# Patient Record
Sex: Male | Born: 1935 | Race: Asian | Hispanic: No | Smoking: Never smoker
Health system: Southern US, Community
[De-identification: ages and names within clinical notes are randomized; demographics above are authoritative.]

## PROBLEM LIST (undated history)

## (undated) DIAGNOSIS — C61 Malignant neoplasm of prostate: Secondary | ICD-10-CM

## (undated) DIAGNOSIS — I739 Peripheral vascular disease, unspecified: Secondary | ICD-10-CM

## (undated) DIAGNOSIS — I6522 Occlusion and stenosis of left carotid artery: Secondary | ICD-10-CM

## (undated) DIAGNOSIS — E119 Type 2 diabetes mellitus without complications: Secondary | ICD-10-CM

## (undated) DIAGNOSIS — E785 Hyperlipidemia, unspecified: Secondary | ICD-10-CM

## (undated) DIAGNOSIS — I700 Atherosclerosis of aorta: Secondary | ICD-10-CM

## (undated) DIAGNOSIS — N400 Enlarged prostate without lower urinary tract symptoms: Secondary | ICD-10-CM

## (undated) DIAGNOSIS — I779 Disorder of arteries and arterioles, unspecified: Secondary | ICD-10-CM

## (undated) DIAGNOSIS — I10 Essential (primary) hypertension: Secondary | ICD-10-CM

## (undated) DIAGNOSIS — I2510 Atherosclerotic heart disease of native coronary artery without angina pectoris: Secondary | ICD-10-CM

## (undated) DIAGNOSIS — I7300 Raynaud's syndrome without gangrene: Secondary | ICD-10-CM

## (undated) DIAGNOSIS — K219 Gastro-esophageal reflux disease without esophagitis: Secondary | ICD-10-CM

## (undated) DIAGNOSIS — D649 Anemia, unspecified: Secondary | ICD-10-CM

## (undated) HISTORY — DX: Gastro-esophageal reflux disease without esophagitis: K21.9

## (undated) HISTORY — DX: Raynaud's syndrome without gangrene: I73.00

## (undated) HISTORY — DX: Occlusion and stenosis of left carotid artery: I65.22

## (undated) HISTORY — DX: Atherosclerotic heart disease of native coronary artery without angina pectoris: I25.10

## (undated) HISTORY — DX: Peripheral vascular disease, unspecified: I73.9

## (undated) HISTORY — DX: Essential (primary) hypertension: I10

## (undated) HISTORY — DX: Hyperlipidemia, unspecified: E78.5

## (undated) HISTORY — DX: Disorder of arteries and arterioles, unspecified: I77.9

## (undated) HISTORY — DX: Atherosclerosis of aorta: I70.0

## (undated) HISTORY — PX: CATARACT EXTRACTION: SUR2

## (undated) HISTORY — DX: Type 2 diabetes mellitus without complications: E11.9

## (undated) HISTORY — DX: Benign prostatic hyperplasia without lower urinary tract symptoms: N40.0

---

## 2007-10-07 ENCOUNTER — Ambulatory Visit: Payer: Self-pay | Admitting: Internal Medicine

## 2008-02-21 HISTORY — PX: CARDIAC CATHETERIZATION: SHX172

## 2009-09-21 ENCOUNTER — Ambulatory Visit: Payer: Self-pay | Admitting: Ophthalmology

## 2009-10-04 ENCOUNTER — Ambulatory Visit: Payer: Self-pay | Admitting: Ophthalmology

## 2009-11-17 ENCOUNTER — Ambulatory Visit: Payer: Self-pay | Admitting: Ophthalmology

## 2012-03-22 DIAGNOSIS — R972 Elevated prostate specific antigen [PSA]: Secondary | ICD-10-CM | POA: Insufficient documentation

## 2012-03-22 DIAGNOSIS — N3941 Urge incontinence: Secondary | ICD-10-CM | POA: Insufficient documentation

## 2012-03-22 DIAGNOSIS — N478 Other disorders of prepuce: Secondary | ICD-10-CM | POA: Insufficient documentation

## 2012-03-22 DIAGNOSIS — B3749 Other urogenital candidiasis: Secondary | ICD-10-CM | POA: Insufficient documentation

## 2012-03-22 DIAGNOSIS — R339 Retention of urine, unspecified: Secondary | ICD-10-CM | POA: Insufficient documentation

## 2012-03-22 DIAGNOSIS — N401 Enlarged prostate with lower urinary tract symptoms: Secondary | ICD-10-CM | POA: Insufficient documentation

## 2012-03-22 DIAGNOSIS — N471 Phimosis: Secondary | ICD-10-CM | POA: Insufficient documentation

## 2012-03-22 DIAGNOSIS — N529 Male erectile dysfunction, unspecified: Secondary | ICD-10-CM | POA: Insufficient documentation

## 2012-03-26 DIAGNOSIS — N401 Enlarged prostate with lower urinary tract symptoms: Secondary | ICD-10-CM | POA: Diagnosis not present

## 2012-03-26 DIAGNOSIS — N4340 Spermatocele of epididymis, unspecified: Secondary | ICD-10-CM | POA: Insufficient documentation

## 2012-03-26 DIAGNOSIS — R972 Elevated prostate specific antigen [PSA]: Secondary | ICD-10-CM | POA: Diagnosis not present

## 2012-03-26 DIAGNOSIS — R339 Retention of urine, unspecified: Secondary | ICD-10-CM | POA: Diagnosis not present

## 2012-03-27 DIAGNOSIS — R6889 Other general symptoms and signs: Secondary | ICD-10-CM | POA: Diagnosis not present

## 2012-03-27 DIAGNOSIS — I10 Essential (primary) hypertension: Secondary | ICD-10-CM | POA: Diagnosis not present

## 2012-03-27 DIAGNOSIS — Z0000 Encounter for general adult medical examination without abnormal findings: Secondary | ICD-10-CM | POA: Diagnosis not present

## 2012-03-27 DIAGNOSIS — R300 Dysuria: Secondary | ICD-10-CM | POA: Diagnosis not present

## 2012-03-27 DIAGNOSIS — E782 Mixed hyperlipidemia: Secondary | ICD-10-CM | POA: Diagnosis not present

## 2012-03-27 DIAGNOSIS — E1142 Type 2 diabetes mellitus with diabetic polyneuropathy: Secondary | ICD-10-CM | POA: Diagnosis not present

## 2012-03-27 DIAGNOSIS — E1149 Type 2 diabetes mellitus with other diabetic neurological complication: Secondary | ICD-10-CM | POA: Diagnosis not present

## 2012-03-27 DIAGNOSIS — I2510 Atherosclerotic heart disease of native coronary artery without angina pectoris: Secondary | ICD-10-CM | POA: Diagnosis not present

## 2012-03-27 DIAGNOSIS — N429 Disorder of prostate, unspecified: Secondary | ICD-10-CM | POA: Diagnosis not present

## 2012-04-02 DIAGNOSIS — Z961 Presence of intraocular lens: Secondary | ICD-10-CM | POA: Diagnosis not present

## 2012-04-02 DIAGNOSIS — M25579 Pain in unspecified ankle and joints of unspecified foot: Secondary | ICD-10-CM | POA: Diagnosis not present

## 2012-04-02 DIAGNOSIS — H2510 Age-related nuclear cataract, unspecified eye: Secondary | ICD-10-CM | POA: Diagnosis not present

## 2012-04-02 DIAGNOSIS — R229 Localized swelling, mass and lump, unspecified: Secondary | ICD-10-CM | POA: Diagnosis not present

## 2012-04-02 DIAGNOSIS — E1049 Type 1 diabetes mellitus with other diabetic neurological complication: Secondary | ICD-10-CM | POA: Diagnosis not present

## 2012-04-11 DIAGNOSIS — M25579 Pain in unspecified ankle and joints of unspecified foot: Secondary | ICD-10-CM | POA: Diagnosis not present

## 2012-04-11 DIAGNOSIS — E1149 Type 2 diabetes mellitus with other diabetic neurological complication: Secondary | ICD-10-CM | POA: Diagnosis not present

## 2012-04-11 DIAGNOSIS — R229 Localized swelling, mass and lump, unspecified: Secondary | ICD-10-CM | POA: Diagnosis not present

## 2012-04-11 DIAGNOSIS — Z0000 Encounter for general adult medical examination without abnormal findings: Secondary | ICD-10-CM | POA: Diagnosis not present

## 2012-04-11 DIAGNOSIS — R072 Precordial pain: Secondary | ICD-10-CM | POA: Diagnosis not present

## 2012-04-11 DIAGNOSIS — R319 Hematuria, unspecified: Secondary | ICD-10-CM | POA: Diagnosis not present

## 2012-04-11 DIAGNOSIS — E782 Mixed hyperlipidemia: Secondary | ICD-10-CM | POA: Diagnosis not present

## 2012-04-11 DIAGNOSIS — IMO0001 Reserved for inherently not codable concepts without codable children: Secondary | ICD-10-CM | POA: Diagnosis not present

## 2012-04-11 DIAGNOSIS — Z006 Encounter for examination for normal comparison and control in clinical research program: Secondary | ICD-10-CM | POA: Diagnosis not present

## 2012-04-11 DIAGNOSIS — L03039 Cellulitis of unspecified toe: Secondary | ICD-10-CM | POA: Diagnosis not present

## 2012-04-30 DIAGNOSIS — N3941 Urge incontinence: Secondary | ICD-10-CM | POA: Diagnosis not present

## 2012-04-30 DIAGNOSIS — N401 Enlarged prostate with lower urinary tract symptoms: Secondary | ICD-10-CM | POA: Diagnosis not present

## 2012-04-30 DIAGNOSIS — R972 Elevated prostate specific antigen [PSA]: Secondary | ICD-10-CM | POA: Diagnosis not present

## 2012-04-30 DIAGNOSIS — R339 Retention of urine, unspecified: Secondary | ICD-10-CM | POA: Diagnosis not present

## 2012-05-07 ENCOUNTER — Ambulatory Visit: Payer: Self-pay | Admitting: Urology

## 2012-05-07 DIAGNOSIS — Z01812 Encounter for preprocedural laboratory examination: Secondary | ICD-10-CM | POA: Diagnosis not present

## 2012-05-07 DIAGNOSIS — E119 Type 2 diabetes mellitus without complications: Secondary | ICD-10-CM | POA: Diagnosis not present

## 2012-05-07 DIAGNOSIS — I10 Essential (primary) hypertension: Secondary | ICD-10-CM | POA: Diagnosis not present

## 2012-05-07 DIAGNOSIS — Z01810 Encounter for preprocedural cardiovascular examination: Secondary | ICD-10-CM | POA: Diagnosis not present

## 2012-05-07 LAB — BASIC METABOLIC PANEL
Anion Gap: 4 — ABNORMAL LOW (ref 7–16)
BUN: 16 mg/dL (ref 7–18)
Calcium, Total: 8.8 mg/dL (ref 8.5–10.1)
Chloride: 104 mmol/L (ref 98–107)
Co2: 28 mmol/L (ref 21–32)
Creatinine: 0.84 mg/dL (ref 0.60–1.30)
EGFR (African American): >60
EGFR (Non-African Amer.): >60
Glucose: 73 mg/dL (ref 65–99)
Osmolality: 272 (ref 275–301)
Potassium: 4.5 mmol/L (ref 3.5–5.1)
Sodium: 136 mmol/L (ref 136–145)

## 2012-05-07 LAB — HEMOGLOBIN: HGB: 13.7 g/dL (ref 13.0–18.0)

## 2012-05-14 ENCOUNTER — Ambulatory Visit: Payer: Self-pay | Admitting: Urology

## 2012-05-14 DIAGNOSIS — R972 Elevated prostate specific antigen [PSA]: Secondary | ICD-10-CM | POA: Diagnosis not present

## 2012-05-14 DIAGNOSIS — N4340 Spermatocele of epididymis, unspecified: Secondary | ICD-10-CM | POA: Diagnosis not present

## 2012-05-14 DIAGNOSIS — E119 Type 2 diabetes mellitus without complications: Secondary | ICD-10-CM | POA: Diagnosis not present

## 2012-05-14 DIAGNOSIS — Z79899 Other long term (current) drug therapy: Secondary | ICD-10-CM | POA: Diagnosis not present

## 2012-05-14 DIAGNOSIS — N138 Other obstructive and reflux uropathy: Secondary | ICD-10-CM | POA: Diagnosis not present

## 2012-05-14 DIAGNOSIS — N401 Enlarged prostate with lower urinary tract symptoms: Secondary | ICD-10-CM | POA: Diagnosis not present

## 2012-05-14 DIAGNOSIS — R339 Retention of urine, unspecified: Secondary | ICD-10-CM | POA: Diagnosis not present

## 2012-05-14 DIAGNOSIS — N529 Male erectile dysfunction, unspecified: Secondary | ICD-10-CM | POA: Diagnosis not present

## 2012-05-14 DIAGNOSIS — N400 Enlarged prostate without lower urinary tract symptoms: Secondary | ICD-10-CM | POA: Diagnosis not present

## 2012-05-14 DIAGNOSIS — I10 Essential (primary) hypertension: Secondary | ICD-10-CM | POA: Diagnosis not present

## 2012-05-15 DIAGNOSIS — R339 Retention of urine, unspecified: Secondary | ICD-10-CM | POA: Diagnosis not present

## 2012-05-15 LAB — PATHOLOGY REPORT

## 2012-05-23 DIAGNOSIS — R339 Retention of urine, unspecified: Secondary | ICD-10-CM | POA: Diagnosis not present

## 2012-05-23 DIAGNOSIS — N4340 Spermatocele of epididymis, unspecified: Secondary | ICD-10-CM | POA: Diagnosis not present

## 2012-05-23 DIAGNOSIS — N401 Enlarged prostate with lower urinary tract symptoms: Secondary | ICD-10-CM | POA: Diagnosis not present

## 2012-07-24 DIAGNOSIS — R339 Retention of urine, unspecified: Secondary | ICD-10-CM | POA: Diagnosis not present

## 2012-07-24 DIAGNOSIS — N4340 Spermatocele of epididymis, unspecified: Secondary | ICD-10-CM | POA: Diagnosis not present

## 2012-07-24 DIAGNOSIS — N401 Enlarged prostate with lower urinary tract symptoms: Secondary | ICD-10-CM | POA: Diagnosis not present

## 2012-07-24 DIAGNOSIS — R972 Elevated prostate specific antigen [PSA]: Secondary | ICD-10-CM | POA: Diagnosis not present

## 2012-10-09 DIAGNOSIS — E1149 Type 2 diabetes mellitus with other diabetic neurological complication: Secondary | ICD-10-CM | POA: Diagnosis not present

## 2012-10-09 DIAGNOSIS — K219 Gastro-esophageal reflux disease without esophagitis: Secondary | ICD-10-CM | POA: Diagnosis not present

## 2012-10-09 DIAGNOSIS — E782 Mixed hyperlipidemia: Secondary | ICD-10-CM | POA: Diagnosis not present

## 2012-10-09 DIAGNOSIS — I10 Essential (primary) hypertension: Secondary | ICD-10-CM | POA: Diagnosis not present

## 2013-01-10 DIAGNOSIS — E119 Type 2 diabetes mellitus without complications: Secondary | ICD-10-CM | POA: Diagnosis not present

## 2013-01-10 DIAGNOSIS — E1142 Type 2 diabetes mellitus with diabetic polyneuropathy: Secondary | ICD-10-CM | POA: Diagnosis not present

## 2013-01-10 DIAGNOSIS — I739 Peripheral vascular disease, unspecified: Secondary | ICD-10-CM | POA: Diagnosis not present

## 2013-01-21 DIAGNOSIS — I10 Essential (primary) hypertension: Secondary | ICD-10-CM | POA: Diagnosis not present

## 2013-01-21 DIAGNOSIS — E119 Type 2 diabetes mellitus without complications: Secondary | ICD-10-CM | POA: Diagnosis not present

## 2013-01-21 DIAGNOSIS — M79609 Pain in unspecified limb: Secondary | ICD-10-CM | POA: Diagnosis not present

## 2013-01-28 DIAGNOSIS — Z23 Encounter for immunization: Secondary | ICD-10-CM | POA: Diagnosis not present

## 2013-03-03 ENCOUNTER — Ambulatory Visit: Payer: Self-pay | Admitting: Physician Assistant

## 2013-03-03 DIAGNOSIS — K219 Gastro-esophageal reflux disease without esophagitis: Secondary | ICD-10-CM | POA: Diagnosis not present

## 2013-03-03 DIAGNOSIS — R141 Gas pain: Secondary | ICD-10-CM | POA: Diagnosis not present

## 2013-03-03 DIAGNOSIS — R0602 Shortness of breath: Secondary | ICD-10-CM | POA: Diagnosis not present

## 2013-03-03 DIAGNOSIS — I10 Essential (primary) hypertension: Secondary | ICD-10-CM | POA: Diagnosis not present

## 2013-03-03 DIAGNOSIS — E1149 Type 2 diabetes mellitus with other diabetic neurological complication: Secondary | ICD-10-CM | POA: Diagnosis not present

## 2013-03-17 DIAGNOSIS — I10 Essential (primary) hypertension: Secondary | ICD-10-CM | POA: Diagnosis not present

## 2013-03-17 DIAGNOSIS — E782 Mixed hyperlipidemia: Secondary | ICD-10-CM | POA: Diagnosis not present

## 2013-03-17 DIAGNOSIS — K219 Gastro-esophageal reflux disease without esophagitis: Secondary | ICD-10-CM | POA: Diagnosis not present

## 2013-03-17 DIAGNOSIS — E1149 Type 2 diabetes mellitus with other diabetic neurological complication: Secondary | ICD-10-CM | POA: Diagnosis not present

## 2013-03-17 DIAGNOSIS — E1142 Type 2 diabetes mellitus with diabetic polyneuropathy: Secondary | ICD-10-CM | POA: Diagnosis not present

## 2013-03-17 DIAGNOSIS — R0602 Shortness of breath: Secondary | ICD-10-CM | POA: Diagnosis not present

## 2013-03-26 DIAGNOSIS — R0602 Shortness of breath: Secondary | ICD-10-CM | POA: Diagnosis not present

## 2013-03-26 DIAGNOSIS — E1049 Type 1 diabetes mellitus with other diabetic neurological complication: Secondary | ICD-10-CM | POA: Diagnosis not present

## 2013-03-26 DIAGNOSIS — I2510 Atherosclerotic heart disease of native coronary artery without angina pectoris: Secondary | ICD-10-CM | POA: Diagnosis not present

## 2013-03-26 DIAGNOSIS — I10 Essential (primary) hypertension: Secondary | ICD-10-CM | POA: Diagnosis not present

## 2013-03-26 DIAGNOSIS — K219 Gastro-esophageal reflux disease without esophagitis: Secondary | ICD-10-CM | POA: Diagnosis not present

## 2013-04-08 DIAGNOSIS — R5381 Other malaise: Secondary | ICD-10-CM | POA: Diagnosis not present

## 2013-04-08 DIAGNOSIS — IMO0001 Reserved for inherently not codable concepts without codable children: Secondary | ICD-10-CM | POA: Diagnosis not present

## 2013-04-09 DIAGNOSIS — E1149 Type 2 diabetes mellitus with other diabetic neurological complication: Secondary | ICD-10-CM | POA: Diagnosis not present

## 2013-04-09 DIAGNOSIS — R059 Cough, unspecified: Secondary | ICD-10-CM | POA: Diagnosis not present

## 2013-04-09 DIAGNOSIS — R05 Cough: Secondary | ICD-10-CM | POA: Diagnosis not present

## 2013-04-09 DIAGNOSIS — K219 Gastro-esophageal reflux disease without esophagitis: Secondary | ICD-10-CM | POA: Diagnosis not present

## 2013-05-12 DIAGNOSIS — L97509 Non-pressure chronic ulcer of other part of unspecified foot with unspecified severity: Secondary | ICD-10-CM | POA: Diagnosis not present

## 2013-05-12 DIAGNOSIS — E1149 Type 2 diabetes mellitus with other diabetic neurological complication: Secondary | ICD-10-CM | POA: Diagnosis not present

## 2013-05-15 DIAGNOSIS — N139 Obstructive and reflux uropathy, unspecified: Secondary | ICD-10-CM | POA: Diagnosis not present

## 2013-05-15 DIAGNOSIS — N4340 Spermatocele of epididymis, unspecified: Secondary | ICD-10-CM | POA: Diagnosis not present

## 2013-05-15 DIAGNOSIS — N401 Enlarged prostate with lower urinary tract symptoms: Secondary | ICD-10-CM | POA: Diagnosis not present

## 2013-05-15 DIAGNOSIS — R972 Elevated prostate specific antigen [PSA]: Secondary | ICD-10-CM | POA: Diagnosis not present

## 2013-05-15 DIAGNOSIS — R339 Retention of urine, unspecified: Secondary | ICD-10-CM | POA: Diagnosis not present

## 2014-01-13 DIAGNOSIS — I10 Essential (primary) hypertension: Secondary | ICD-10-CM | POA: Diagnosis not present

## 2014-01-13 DIAGNOSIS — Z0001 Encounter for general adult medical examination with abnormal findings: Secondary | ICD-10-CM | POA: Diagnosis not present

## 2014-01-13 DIAGNOSIS — R0602 Shortness of breath: Secondary | ICD-10-CM | POA: Diagnosis not present

## 2014-01-13 DIAGNOSIS — R300 Dysuria: Secondary | ICD-10-CM | POA: Diagnosis not present

## 2014-01-13 DIAGNOSIS — Z23 Encounter for immunization: Secondary | ICD-10-CM | POA: Diagnosis not present

## 2014-01-13 DIAGNOSIS — E1142 Type 2 diabetes mellitus with diabetic polyneuropathy: Secondary | ICD-10-CM | POA: Diagnosis not present

## 2014-01-13 DIAGNOSIS — B351 Tinea unguium: Secondary | ICD-10-CM | POA: Diagnosis not present

## 2014-01-13 DIAGNOSIS — E782 Mixed hyperlipidemia: Secondary | ICD-10-CM | POA: Diagnosis not present

## 2014-01-16 DIAGNOSIS — E782 Mixed hyperlipidemia: Secondary | ICD-10-CM | POA: Diagnosis not present

## 2014-01-16 DIAGNOSIS — Z0001 Encounter for general adult medical examination with abnormal findings: Secondary | ICD-10-CM | POA: Diagnosis not present

## 2014-01-16 DIAGNOSIS — I10 Essential (primary) hypertension: Secondary | ICD-10-CM | POA: Diagnosis not present

## 2014-01-16 DIAGNOSIS — E1142 Type 2 diabetes mellitus with diabetic polyneuropathy: Secondary | ICD-10-CM | POA: Diagnosis not present

## 2014-01-16 DIAGNOSIS — I2510 Atherosclerotic heart disease of native coronary artery without angina pectoris: Secondary | ICD-10-CM | POA: Diagnosis not present

## 2014-04-20 DIAGNOSIS — E1142 Type 2 diabetes mellitus with diabetic polyneuropathy: Secondary | ICD-10-CM | POA: Diagnosis not present

## 2014-04-20 DIAGNOSIS — B351 Tinea unguium: Secondary | ICD-10-CM | POA: Diagnosis not present

## 2014-04-20 DIAGNOSIS — E782 Mixed hyperlipidemia: Secondary | ICD-10-CM | POA: Diagnosis not present

## 2014-04-20 DIAGNOSIS — I10 Essential (primary) hypertension: Secondary | ICD-10-CM | POA: Diagnosis not present

## 2014-06-12 NOTE — Op Note (Signed)
PATIENT NAME:  Michael [name], Michael [name] MR#:  [id number] DATE OF BIRTH:  Jan 18, 1934  DATE OF PROCEDURE:  05/14/2012  PREOPERATIVE DIAGNOSES:  Benign prostatic hypertrophy, left spermatocele.   POSTOPERATIVE DIAGNOSES:  Benign prostatic hypertrophy, left spermatocele.   PROCEDURE:  KTP laser prostatectomy, left spermatocelectomy.   SURGEON:  Dr. Edrick Oh.   ANESTHESIA:  Laryngeal mask airway anesthesia.   INDICATIONS:  The patient is a 81 year old gentleman with a long history of a left hydrocele. He has also had a history of benign prostatic hypertrophy. He has been on medical management for a number of years. He has had progressive increase in his postvoid residual. He is bordering on chronic urinary retention. He presents for KTP laser prostatectomy with the enlargement of the left spermatocele. We also have elected to proceed with left spermatocelectomy. He presents today for this purpose.   DESCRIPTION OF PROCEDURE: After informed consent was obtained, the patient was taken to the operating room and placed in the dorsal lithotomy position under laryngeal mask airway anesthesia. The patient was then prepped and draped in the usual standard fashion. The KTP laser scope was placed utilizing the visual obturator. Some resistance was met at the level of the sphincter. The scope was angled slightly to allow passage through this area without further difficulty. Upon entering the prostatic fossa, prominent bilobar hypertrophy was noted. The prostatic fossa was noted to be relatively short. A high riding bladder neck was noted. The ureteral orifices were more than an adequate distance from the bladder neck. The mucosa demonstrated no significant abnormalities. The KTP laser fiber was then inserted through the scope, starting at 80 watts. An incision was made through the bladder neck to the level of the trigone. The lateral lobe tissue was taken down to the level of the verumontanum. The wattage was then  increased to 120. Additional lateral lobe tissue was taken down. A small amount of anterior tissue was also present. Some intravesical tissue was appreciated, which was ablated at the level of the bladder neck. The residual lateral tissue near the apex was taken down with care taken to maintain adequate distance at the verumontanum. Upon completion of the resection, a more than adequate channel was present through the prostatic urethra. A few prostatic concretions were encountered during the resection. The predominance of the apparent obstruction was related to the high riding bladder neck No significant bleeding was encountered. Upon completion of the resection, the ureteral orifices were re-examined with no abnormalities noted. The laser scope was removed. An 18-French Foley catheter was placed to gravity drainage without difficulty. The patient was returned to the supine position. He was then reprepped and draped in the standard fashion. The spermatocelectomy was then performed. An approximate 4 cm incision was made in the anterior mid scrotum. It was continued down into the left hemiscrotum. The tunica was entered. The testicle was delivered from the left tunica. A large hydrocele was noted distorting the appearance and position of the epididymis. A plane was developed dissecting the outer tissue off of the spermatocele. Once the proper plane was encountered, the dissection was continued freeing the spermatocele circumferentially. No significant bleeding or abnormalities were encountered. The dissection was continued down to a small base of tissue. A 3-0 chromic suture was placed through the base. It was then secured in standard fashion. The hydrocele was then cut free. The base was cauterized with electrocautery. The tunica was evaginated posterior to the testicle. It was secured with 3 interrupted 3-0 chromic sutures. The testicle was  returned to the hemiscrotum. The scrotum was first closed in an inner layer  utilizing a 3-0 chromic suture. The skin was then closed utilizing a mattress stitch 2-0 chromic suture. Collodion was then placed. Fluffs and a scrotal support were then placed. The scrotum was anesthetized utilizing 10 mL of 0.5% Marcaine. The patient was awakened from laryngeal mask airway anesthesia and was taken to the recovery room in stable condition. There were no problems or complications. The patient tolerated the procedure well.   ESTIMATED BLOOD LOSS: Approximately 50 mL.     ____________________________ Denice Bors. Jacqlyn Larsen, MD bsc:dmm D: 05/14/2012 10:01:15 ET T: 05/14/2012 10:56:27 ET JOB#: [id number]  cc: Denice Bors. Jacqlyn Larsen, MD, <Dictator> Denice Bors Jesus Nevills MD ELECTRONICALLY SIGNED 05/15/2012 8:22

## 2014-10-29 DIAGNOSIS — R972 Elevated prostate specific antigen [PSA]: Secondary | ICD-10-CM | POA: Diagnosis not present

## 2014-10-29 DIAGNOSIS — N4340 Spermatocele of epididymis, unspecified: Secondary | ICD-10-CM | POA: Diagnosis not present

## 2014-10-29 DIAGNOSIS — N401 Enlarged prostate with lower urinary tract symptoms: Secondary | ICD-10-CM | POA: Diagnosis not present

## 2014-10-29 DIAGNOSIS — R339 Retention of urine, unspecified: Secondary | ICD-10-CM | POA: Diagnosis not present

## 2014-12-22 DIAGNOSIS — R972 Elevated prostate specific antigen [PSA]: Secondary | ICD-10-CM | POA: Diagnosis not present

## 2014-12-22 DIAGNOSIS — I10 Essential (primary) hypertension: Secondary | ICD-10-CM | POA: Diagnosis not present

## 2014-12-22 DIAGNOSIS — I6522 Occlusion and stenosis of left carotid artery: Secondary | ICD-10-CM | POA: Diagnosis not present

## 2014-12-22 DIAGNOSIS — R0602 Shortness of breath: Secondary | ICD-10-CM | POA: Diagnosis not present

## 2014-12-22 DIAGNOSIS — E782 Mixed hyperlipidemia: Secondary | ICD-10-CM | POA: Diagnosis not present

## 2014-12-22 DIAGNOSIS — E119 Type 2 diabetes mellitus without complications: Secondary | ICD-10-CM | POA: Diagnosis not present

## 2014-12-22 DIAGNOSIS — I2584 Coronary atherosclerosis due to calcified coronary lesion: Secondary | ICD-10-CM | POA: Diagnosis not present

## 2014-12-28 DIAGNOSIS — R0602 Shortness of breath: Secondary | ICD-10-CM | POA: Diagnosis not present

## 2014-12-28 DIAGNOSIS — R079 Chest pain, unspecified: Secondary | ICD-10-CM | POA: Diagnosis not present

## 2014-12-30 DIAGNOSIS — I6522 Occlusion and stenosis of left carotid artery: Secondary | ICD-10-CM | POA: Diagnosis not present

## 2015-01-06 DIAGNOSIS — R0602 Shortness of breath: Secondary | ICD-10-CM | POA: Diagnosis not present

## 2015-01-21 DIAGNOSIS — Z0001 Encounter for general adult medical examination with abnormal findings: Secondary | ICD-10-CM | POA: Diagnosis not present

## 2015-01-21 DIAGNOSIS — Z23 Encounter for immunization: Secondary | ICD-10-CM | POA: Diagnosis not present

## 2015-01-22 ENCOUNTER — Encounter: Payer: Self-pay | Admitting: Cardiovascular Disease

## 2015-01-22 ENCOUNTER — Ambulatory Visit: Payer: Self-pay | Admitting: Cardiovascular Disease

## 2015-01-22 ENCOUNTER — Ambulatory Visit (INDEPENDENT_AMBULATORY_CARE_PROVIDER_SITE_OTHER): Payer: Medicare Other | Admitting: Cardiovascular Disease

## 2015-01-22 VITALS — BP 130/78 | HR 83 | Ht 65.0 in | Wt 140.2 lb

## 2015-01-22 DIAGNOSIS — R0602 Shortness of breath: Secondary | ICD-10-CM

## 2015-01-22 DIAGNOSIS — I25119 Atherosclerotic heart disease of native coronary artery with unspecified angina pectoris: Secondary | ICD-10-CM | POA: Diagnosis not present

## 2015-01-22 DIAGNOSIS — I2584 Coronary atherosclerosis due to calcified coronary lesion: Secondary | ICD-10-CM | POA: Diagnosis not present

## 2015-01-22 DIAGNOSIS — D508 Other iron deficiency anemias: Secondary | ICD-10-CM | POA: Diagnosis not present

## 2015-01-22 DIAGNOSIS — R079 Chest pain, unspecified: Secondary | ICD-10-CM | POA: Diagnosis not present

## 2015-01-22 DIAGNOSIS — D510 Vitamin B12 deficiency anemia due to intrinsic factor deficiency: Secondary | ICD-10-CM | POA: Diagnosis not present

## 2015-01-22 DIAGNOSIS — E538 Deficiency of other specified B group vitamins: Secondary | ICD-10-CM | POA: Diagnosis not present

## 2015-01-22 DIAGNOSIS — I10 Essential (primary) hypertension: Secondary | ICD-10-CM

## 2015-01-22 DIAGNOSIS — E782 Mixed hyperlipidemia: Secondary | ICD-10-CM | POA: Diagnosis not present

## 2015-01-22 DIAGNOSIS — D649 Anemia, unspecified: Secondary | ICD-10-CM | POA: Diagnosis not present

## 2015-01-22 DIAGNOSIS — E119 Type 2 diabetes mellitus without complications: Secondary | ICD-10-CM | POA: Diagnosis not present

## 2015-01-22 DIAGNOSIS — Z0001 Encounter for general adult medical examination with abnormal findings: Secondary | ICD-10-CM | POA: Diagnosis not present

## 2015-01-22 DIAGNOSIS — I2510 Atherosclerotic heart disease of native coronary artery without angina pectoris: Secondary | ICD-10-CM

## 2015-01-22 DIAGNOSIS — T8069XS Other serum reaction due to other serum, sequela: Secondary | ICD-10-CM | POA: Diagnosis not present

## 2015-01-22 DIAGNOSIS — E785 Hyperlipidemia, unspecified: Secondary | ICD-10-CM | POA: Insufficient documentation

## 2015-01-22 NOTE — Patient Instructions (Addendum)
Medication Instructions:  Your physician recommends that you continue on your current medications as directed. Please refer to the Current Medication list given to you today.   Labwork: PT/INR  Testing/Procedures: A chest x-ray takes a picture of the organs and structures inside the chest, including the heart, lungs, and blood vessels. This test can show several things, including, whether the heart is enlarges; whether fluid is building up in the lungs; and whether pacemaker / defibrillator leads are still in place.  Your physician has requested that you have a cardiac catheterization. Cardiac catheterization is used to diagnose and/or treat various heart conditions. Doctors may recommend this procedure for a number of different reasons. The most common reason is to evaluate chest pain. Chest pain can be a symptom of coronary artery disease (CAD), and cardiac catheterization can show whether plaque is narrowing or blocking your heart's arteries. This procedure is also used to evaluate the valves, as well as measure the blood flow and oxygen levels in different parts of your heart. For further information please visit HugeFiesta.tn. Please follow instruction sheet, as given.  [facility] Cardiac Cath Instructions   You are scheduled for a Cardiac Cath on: Friday, Dec. 9  Please arrive at 6:30am on the day of your procedure  You will need to pre-register prior to the day of your procedure.  Enter through the [employer] at [facility].  Registration is the first desk on your right.  Please take the procedure order we have given you in order to be registered appropriately  Do not eat/drink anything after midnight  Someone will need to drive you home  It is recommended someone be with you for the first 24 hours after your procedure  Wear clothes that are easy to get on/off and wear slip on shoes if possible   Medications bring a current list of all medications with you   _xx__ Do not take these  medications before your procedure: Do not take glimepiride the morning of your procedure.   Day of your procedure: Arrive at the [facility] entrance.  Free valet service is available.  After entering the [place] please check-in at the registration desk (1st desk on your right) to receive your armband. After receiving your armband someone will escort you to the cardiac cath/special procedures waiting area.  The usual length of stay after your procedure is about 2 to 3 hours.  This can vary.  If you have any questions, please call our office at [phone], or you may call the cardiac cath lab at [facility] directly at [phone]   Follow-Up: Your physician recommends that you schedule a follow-up appointment in: two weeks with Dr. Fletcher Anon.    Any Other Special Instructions Will Be Listed Below (If Applicable).     If you need a refill on your cardiac medications before your next appointment, please call your pharmacy.  Angiogram, Care After Refer to this sheet in the next few weeks. These instructions provide you with information about caring for yourself after your procedure. Your health care provider may also give you more specific instructions. Your treatment has been planned according to current medical practices, but problems sometimes occur. Call your health care provider if you have any problems or questions after your procedure. WHAT TO EXPECT AFTER THE PROCEDURE After your procedure, it is typical to have the following:  Bruising at the catheter insertion site that usually fades within 1-2 weeks.  Blood collecting in the tissue (hematoma) that may be painful to the  touch. It should usually decrease in size and tenderness within 1-2 weeks. HOME CARE INSTRUCTIONS  Take medicines only as directed by your health care provider.  You may shower 24-48 hours after the procedure or as directed by your health care provider. Remove the bandage (dressing) and gently wash the site with  plain soap and water. Pat the area dry with a clean towel. Do not rub the site, because this may cause bleeding.  Do not take baths, swim, or use a hot tub until your health care provider approves.  Check your insertion site every day for redness, swelling, or drainage.  Do not apply powder or lotion to the site.  Do not lift over 10 lb (4.5 kg) for 5 days after your procedure or as directed by your health care provider.  Ask your health care provider when it is okay to:  Return to work or school.  Resume usual physical activities or sports.  Resume sexual activity.  Do not drive home if you are discharged the same day as the procedure. Have someone else drive you.  You may drive 24 hours after the procedure unless otherwise instructed by your health care provider.  Do not operate machinery or power tools for 24 hours after the procedure or as directed by your health care provider.  If your procedure was done as an outpatient procedure, which means that you went home the same day as your procedure, a responsible adult should be with you for the first 24 hours after you arrive home.  Keep all follow-up visits as directed by your health care provider. This is important. SEEK MEDICAL CARE IF:  You have a fever.  You have chills.  You have increased bleeding from the catheter insertion site. Hold pressure on the site. SEEK IMMEDIATE MEDICAL CARE IF:  You have unusual pain at the catheter insertion site.  You have redness, warmth, or swelling at the catheter insertion site.  You have drainage (other than a small amount of blood on the dressing) from the catheter insertion site.  The catheter insertion site is bleeding, and the bleeding does not stop after 30 minutes of holding steady pressure on the site.  The area near or just beyond the catheter insertion site becomes pale, cool, tingly, or numb.   This information is not intended to replace advice given to you by your  health care provider. Make sure you discuss any questions you have with your health care provider.   Document Released: 08/25/2004 Document Revised: 02/27/2014 Document Reviewed: 07/10/2012 Elsevier Interactive Patient Education 2016 [place] An angiogram, also called angiography, is a procedure used to look at the blood vessels. In this procedure, dye is injected through a long, thin tube (catheter) into an artery. X-rays are then taken. The X-rays will show if there is a blockage or problem in a blood vessel.  LET [facility] CARE PROVIDER KNOW ABOUT:  Any allergies you have, including allergies to shellfish or contrast dye.   All medicines you are taking, including vitamins, herbs, eye drops, creams, and over-the-counter medicines.   Previous problems you or members of your family have had with the use of anesthetics.   Any blood disorders you have.   Previous surgeries you have had.  Any previous kidney problems or failure you have had.  Medical conditions you have.   Possibility of pregnancy, if this applies. RISKS AND COMPLICATIONS Generally, an angiogram is a safe procedure. However, as with any procedure, problems can occur.  Possible problems include:  Injury to the blood vessels, including rupture or bleeding.  Infection or bruising at the catheter site.  Allergic reaction to the dye or contrast used.  Kidney damage from the dye or contrast used.  Blood clots that can lead to a stroke or heart attack. BEFORE THE PROCEDURE  Do not eat or drink after midnight on the night before the procedure, or as directed by your health care provider.   Ask your health care provider if you may drink enough water to take any needed medicines the morning of the procedure.  PROCEDURE  You may be given a medicine to help you relax (sedative) before and during the procedure. This medicine is given through an IV access tube that is inserted into one of your veins.    The area where the catheter will be inserted will be washed and shaved. This is usually done in the groin but may be done in the fold of your arm (near your elbow) or in the wrist.  A medicine will be given to numb the area where the catheter will be inserted (local anesthetic).  The catheter will be inserted with a guide wire into an artery. The catheter is guided by using a type of X-ray (fluoroscopy) to the blood vessel being examined.   Dye is then injected into the catheter, and X-rays are taken. The dye helps to show where any narrowing or blockages are located.  AFTER THE PROCEDURE   If the procedure is done through the leg, you will be kept in bed lying flat for several hours. You will be instructed to not bend or cross your legs.  The insertion site will be checked frequently.  The pulse in your feet or wrist will be checked frequently.  Additional blood tests, X-rays, and electrocardiography may be done.   You may need to stay in the hospital overnight for observation.    This information is not intended to replace advice given to you by your health care provider. Make sure you discuss any questions you have with your health care provider.   Document Released: 11/16/2004 Document Revised: 02/27/2014 Document Reviewed: 07/10/2012 Elsevier Interactive Patient Education [employer].

## 2015-01-22 NOTE — Assessment & Plan Note (Signed)
Blood pressure is controlled on current medications. 

## 2015-01-22 NOTE — Assessment & Plan Note (Signed)
The patient was recently started on atorvastatin.

## 2015-01-22 NOTE — Assessment & Plan Note (Signed)
The patient does not have chest pain. However, his dyspnea with minimal activities is likely angina equivalent. Stress test was abnormal and overall moderate risk. I gave him different options of either proceeding with a nuclear stress test and if abnormal proceeding with cardiac catheterization versus proceeding directly with cardiac catheterization given  his significant exertional dyspnea and abnormal treadmill stress test. I discussed different risks and benefits with him and his family and  given severity of his symptoms he prefers to proceed directly with cardiac catheterization.  The procedure would be scheduled for next week. An interpreter will be needed.

## 2015-01-22 NOTE — Progress Notes (Signed)
Primary care physician: Dr. Clayborn Bigness  HPI  This is a 81 year old male who is here today for consultation of exertional dyspnea and abnormal stress test. There is reported history of coronary artery disease with previous cardiac catheterization done at [facility] in 2010. The patient was told about 2 blockages but did not have revascularization done. He has chronic medical conditions that include type 2 diabetes, hypertension and possible hyperlipidemia. Recent carotid Doppler showed mild to moderate plaque formation without obstructive disease and he was started on atorvastatin. He has been complaining of dyspnea with minimal activities over the last 6 months which has been more noticeable recently. He gets out of breath when he tries to go upstairs in his bedroom. He also gets out of breath walking to the kitchen according to his daughter. He denies any chest discomfort. No orthopnea, PND or lower extremity edema. He had a treadmill stress test done which showed 1 mm of horizontal ST depression in the inferior leads.  Not on File   No current outpatient prescriptions on file prior to visit.   No current facility-administered medications on file prior to visit.     Past Medical History  Diagnosis Date  . Hypertension   . Diabetes mellitus without complication ([place])   . GERD (gastroesophageal reflux disease)   . Benign prostatic hypertrophy   . Left carotid artery stenosis   . Coronary artery disease     Previous cardiac catheterization at [facility] in 2010. The patient was told about 2 blockages which did not require revascularization.  . Essential hypertension   . Hyperlipidemia   . Bilateral carotid artery disease ([facility])     Mild plaque formation without obstructive disease noted on carotid Doppler.     Past Surgical History  Procedure Laterality Date  . Cataract extraction    . Prostatectomy    . Cardiac catheterization  2010     Family History  Problem Relation Age of Onset  .  Family history unknown: Yes     Social History   Social History  . Marital Status: Married    Spouse Name: N/A  . Number of Children: N/A  . Years of Education: N/A   Occupational History  . Not on file.   Social History Main Topics  . Smoking status: Never Smoker   . Smokeless tobacco: Not on file  . Alcohol Use: No  . Drug Use: No  . Sexual Activity: Not on file   Other Topics Concern  . Not on file   Social History Narrative     ROS A 10 point review of system was performed. It is negative other than that mentioned in the history of present illness.   PHYSICAL EXAM   BP 130/78 mmHg  Pulse 83  Ht 5\' 5"  (1.651 m)  Wt 140 lb 4 oz (63.617 kg)  BMI 23.34 kg/m2 Constitutional: He is oriented to person, place, and time. He appears well-developed and well-nourished. No distress.  HENT: No nasal discharge.  Head: Normocephalic and atraumatic.  Eyes: Pupils are equal and round.  No discharge. Neck: Normal range of motion. Neck supple. No JVD present. No thyromegaly present.  Cardiovascular: Normal rate, regular rhythm, normal heart sounds. Exam reveals no gallop and no friction rub. No murmur heard.  Pulmonary/Chest: Effort normal and breath sounds normal. No stridor. No respiratory distress. He has no wheezes. He has no rales. He exhibits no tenderness.  Abdominal: Soft. Bowel sounds are normal. He exhibits no distension. There is no tenderness.  There is no rebound and no guarding.  Musculoskeletal: Normal range of motion. He exhibits no edema and no tenderness.  Neurological: He is alert and oriented to person, place, and time. Coordination normal.  Skin: Skin is warm and dry. No rash noted. He is not diaphoretic. No erythema. No pallor.  Psychiatric: He has a normal mood and affect. His behavior is normal. Judgment and thought content normal.       EKG: Normal sinus rhythm with no significant ST or T wave changes.   ASSESSMENT AND PLAN

## 2015-01-25 ENCOUNTER — Telehealth: Payer: Self-pay

## 2015-01-25 ENCOUNTER — Other Ambulatory Visit: Payer: Self-pay

## 2015-01-25 DIAGNOSIS — Z01812 Encounter for preprocedural laboratory examination: Secondary | ICD-10-CM

## 2015-01-25 NOTE — Telephone Encounter (Signed)
S/w Crystal, [place] cath lab, who informed that no interpreters available at [facility] for the month of December. S/w [place] at [facility] interpreter services, [phone] who states interpreter for [place] is out of the country. Same service used for [facility] therefore, no interpreter avail at either hospital. Dr. Fletcher Anon aware and states [facility] cath lab will need to use language line. Left message for Crystal to call back.

## 2015-01-25 NOTE — Telephone Encounter (Signed)
l mom to inform pt of post cath appt on 03/11/2014, will be put on wait list.

## 2015-01-25 NOTE — Patient Instructions (Signed)
Your physician has requested that you have a cardiac catheterization. Cardiac catheterization is used to diagnose and/or treat various heart conditions. Doctors may recommend this procedure for a number of different reasons. The most common reason is to evaluate chest pain. Chest pain can be a symptom of coronary artery disease (CAD), and cardiac catheterization can show whether plaque is narrowing or blocking your heart's arteries. This procedure is also used to evaluate the valves, as well as measure the blood flow and oxygen levels in different parts of your heart. For further information please visit HugeFiesta.tn. Please follow instruction sheet, as given.  [facility] Cardiac Cath Instructions   You are scheduled for a Cardiac Cath on: Wednesday, Dec 14, 8:30am  Please arrive at 6:30am on the day of your procedure  Do not eat/drink anything after midnight  Someone will need to drive you home  It is recommended someone be with you for the first 24 hours after your procedure  Wear clothes that are easy to get on/off and wear slip on shoes if possible   Medications bring a current list of all medications with you  xx___ Do not take these medications before your procedure: Do not take glimepiride the morning of your procedure.   Day of your procedure: Arrive at [facility], [address] [place] Stay Entrance A, [place] The usual length of stay after your procedure is about 2 to 3 hours.  This can vary.  If you have any questions, please call our office at [phone].

## 2015-01-25 NOTE — Telephone Encounter (Signed)
Pt daughter called, wants to cancel angiogram scheduled for this Friday, 12/9, and would like to r/s. Please call.

## 2015-01-25 NOTE — Telephone Encounter (Signed)
Received call from [place], [facility] cath lab. Informed her of need for language line for 12/114 cath. Crystal verbalized understanding with no questions.

## 2015-01-25 NOTE — Telephone Encounter (Signed)
-----   Message from Georgiana Shore, RN sent at 01/25/2015  1:18 PM EST ----- Could you call pt's daughter (he doesn't speak English) to schedule 2 week f/u post cardiac cath on 12/14?  Thx!

## 2015-01-25 NOTE — Telephone Encounter (Signed)
S/w pt daughter who states they would like to cancel heart cath at [facility] and schedule at [facility]. Informed pt I would get a new date and CB.  Cancelled 12/9 heart cath at [facility] w/ Pamala Hurry. S/w Crystal at [facility]. Rescheduled [facility] 12/14, 8:30am w/6:30 arrival Informed her of need for interpreter Clifton Custard)  S/w daughter who is agreeable w/Dec 14 date. She is aware pt still needs labs and CXR prior to procedure. Mailed [place] info to pt.

## 2015-01-27 ENCOUNTER — Other Ambulatory Visit
Admission: RE | Admit: 2015-01-27 | Discharge: 2015-01-27 | Disposition: A | Discharge status: Home / Self Care | Payer: Medicare Other | Source: Ambulatory Visit | Attending: Cardiovascular Disease | Admitting: Cardiovascular Disease

## 2015-01-27 ENCOUNTER — Ambulatory Visit
Admission: RE | Admit: 2015-01-27 | Discharge: 2015-01-27 | Disposition: A | Discharge status: Home / Self Care | Payer: Medicare Other | Source: Ambulatory Visit | Attending: Cardiovascular Disease | Admitting: Cardiovascular Disease

## 2015-01-27 DIAGNOSIS — R05 Cough: Secondary | ICD-10-CM | POA: Diagnosis not present

## 2015-01-27 DIAGNOSIS — R0602 Shortness of breath: Secondary | ICD-10-CM | POA: Insufficient documentation

## 2015-01-27 LAB — PROTIME-INR
INR: 1.12
Prothrombin Time: 14.6 seconds (ref 11.4–15.0)

## 2015-01-28 DIAGNOSIS — Z23 Encounter for immunization: Secondary | ICD-10-CM | POA: Diagnosis not present

## 2015-01-29 ENCOUNTER — Ambulatory Visit: Admission: RE | Admit: 2015-01-29 | Payer: Medicare Other | Source: Ambulatory Visit | Admitting: Cardiovascular Disease

## 2015-01-29 ENCOUNTER — Encounter: Admission: RE | Payer: Self-pay | Source: Ambulatory Visit

## 2015-01-29 SURGERY — LEFT HEART CATH AND CORONARY ANGIOGRAPHY
Anesthesia: Moderate Sedation

## 2015-02-02 ENCOUNTER — Telehealth: Payer: Self-pay

## 2015-02-02 NOTE — Telephone Encounter (Signed)
Left detailed message w/CB number on pt VM for daughter, Michael [name]. Reviewed cardiac cath instructions. Daughter to [facility] if further questions.

## 2015-02-03 ENCOUNTER — Encounter (HOSPITAL_COMMUNITY)
Admission: RE | Disposition: A | Discharge status: Home / Self Care | Payer: Medicare Other | Source: Ambulatory Visit | Attending: Cardiovascular Disease

## 2015-02-03 ENCOUNTER — Ambulatory Visit (HOSPITAL_COMMUNITY)
Admission: RE | Admit: 2015-02-03 | Discharge: 2015-02-03 | Disposition: A | Discharge status: Home / Self Care | Payer: Medicare Other | Source: Ambulatory Visit | Attending: Cardiovascular Disease | Admitting: Cardiovascular Disease

## 2015-02-03 ENCOUNTER — Encounter (HOSPITAL_COMMUNITY): Payer: Self-pay | Admitting: Cardiovascular Disease

## 2015-02-03 DIAGNOSIS — N400 Enlarged prostate without lower urinary tract symptoms: Secondary | ICD-10-CM | POA: Insufficient documentation

## 2015-02-03 DIAGNOSIS — I6523 Occlusion and stenosis of bilateral carotid arteries: Secondary | ICD-10-CM | POA: Diagnosis not present

## 2015-02-03 DIAGNOSIS — R9439 Abnormal result of other cardiovascular function study: Secondary | ICD-10-CM | POA: Diagnosis present

## 2015-02-03 DIAGNOSIS — E119 Type 2 diabetes mellitus without complications: Secondary | ICD-10-CM | POA: Insufficient documentation

## 2015-02-03 DIAGNOSIS — I10 Essential (primary) hypertension: Secondary | ICD-10-CM | POA: Diagnosis not present

## 2015-02-03 DIAGNOSIS — E785 Hyperlipidemia, unspecified: Secondary | ICD-10-CM | POA: Diagnosis not present

## 2015-02-03 DIAGNOSIS — K219 Gastro-esophageal reflux disease without esophagitis: Secondary | ICD-10-CM | POA: Diagnosis not present

## 2015-02-03 DIAGNOSIS — I25119 Atherosclerotic heart disease of native coronary artery with unspecified angina pectoris: Secondary | ICD-10-CM | POA: Insufficient documentation

## 2015-02-03 DIAGNOSIS — I2584 Coronary atherosclerosis due to calcified coronary lesion: Secondary | ICD-10-CM | POA: Diagnosis not present

## 2015-02-03 HISTORY — PX: CARDIAC CATHETERIZATION: SHX172

## 2015-02-03 LAB — BASIC METABOLIC PANEL
Anion gap: 7 (ref 5–15)
BUN: 12 mg/dL (ref 6–20)
CO2: 27 mmol/L (ref 22–32)
Calcium: 9 mg/dL (ref 8.9–10.3)
Chloride: 104 mmol/L (ref 101–111)
Creatinine, Ser: 1.12 mg/dL (ref 0.61–1.24)
GFR calc Af Amer: >60 mL/min (ref >60)
GFR calc non Af Amer: >60 mL/min (ref >60)
Glucose, Bld: 124 mg/dL — ABNORMAL HIGH (ref 65–99)
Potassium: 4.1 mmol/L (ref 3.5–5.1)
Sodium: 138 mmol/L (ref 135–145)

## 2015-02-03 LAB — CBC
HCT: 40.9 % (ref 39.0–52.0)
Hemoglobin: 13.3 g/dL (ref 13.0–17.0)
MCH: 29.5 pg (ref 26.0–34.0)
MCHC: 32.5 g/dL (ref 30.0–36.0)
MCV: 90.7 fL (ref 78.0–100.0)
Platelets: 227 10*3/uL (ref 150–400)
RBC: 4.51 MIL/uL (ref 4.22–5.81)
RDW: 13 % (ref 11.5–15.5)
WBC: 5 10*3/uL (ref 4.0–10.5)

## 2015-02-03 LAB — GLUCOSE, CAPILLARY: Glucose-Capillary: 114 mg/dL — ABNORMAL HIGH (ref 65–99)

## 2015-02-03 SURGERY — LEFT HEART CATH AND CORONARY ANGIOGRAPHY
Anesthesia: LOCAL

## 2015-02-03 MED ORDER — SODIUM CHLORIDE 0.9 % IJ SOLN: 3.0000 mL | INTRAMUSCULAR | Status: DC | PRN

## 2015-02-03 MED ORDER — LIDOCAINE HCL (PF) 1 % IJ SOLN
INTRAMUSCULAR | Status: AC
  Filled 2015-02-03: qty 30

## 2015-02-03 MED ORDER — HEPARIN (PORCINE) IN NACL 2-0.9 UNIT/ML-% IJ SOLN
INTRAMUSCULAR | Status: DC | PRN
  Administered 2015-02-03: 09:00:00

## 2015-02-03 MED ORDER — SODIUM CHLORIDE 0.9 % IV SOLN
250.0000 mL | INTRAVENOUS | Status: DC | PRN
Start: 2015-02-03 — End: 2015-02-03

## 2015-02-03 MED ORDER — SODIUM CHLORIDE 0.9 % IV SOLN: INTRAVENOUS | Status: DC

## 2015-02-03 MED ORDER — HEPARIN SODIUM (PORCINE) 1000 UNIT/ML IJ SOLN
INTRAMUSCULAR | Status: AC
  Filled 2015-02-03: qty 1

## 2015-02-03 MED ORDER — SODIUM CHLORIDE 0.9 % IV SOLN: 250.0000 mL | INTRAVENOUS | Status: DC | PRN

## 2015-02-03 MED ORDER — ASPIRIN 81 MG PO CHEW
81.0000 mg | CHEWABLE_TABLET | ORAL | Status: AC
  Administered 2015-02-03: 81 mg via ORAL

## 2015-02-03 MED ORDER — SODIUM CHLORIDE 0.9 % IJ SOLN: 3.0000 mL | Freq: Two times a day (BID) | INTRAMUSCULAR | Status: DC

## 2015-02-03 MED ORDER — VERAPAMIL HCL 2.5 MG/ML IV SOLN
INTRAVENOUS | Status: AC
  Filled 2015-02-03: qty 2

## 2015-02-03 MED ORDER — ASPIRIN 81 MG PO CHEW
CHEWABLE_TABLET | ORAL | Status: AC
  Filled 2015-02-03: qty 1

## 2015-02-03 MED ORDER — HEPARIN SODIUM (PORCINE) 1000 UNIT/ML IJ SOLN
INTRAMUSCULAR | Status: DC | PRN
  Administered 2015-02-03: 4000 [IU] via INTRAVENOUS

## 2015-02-03 MED ORDER — SODIUM CHLORIDE 0.9 % WEIGHT BASED INFUSION
1.0000 mL/kg/h | INTRAVENOUS | Status: DC
  Administered 2015-02-03: 1 mL/kg/h via INTRAVENOUS

## 2015-02-03 MED ORDER — HEPARIN (PORCINE) IN NACL 2-0.9 UNIT/ML-% IJ SOLN
INTRAMUSCULAR | Status: AC
  Filled 2015-02-03: qty 1000

## 2015-02-03 MED ORDER — SODIUM CHLORIDE 0.9 % WEIGHT BASED INFUSION
3.0000 mL/kg/h | INTRAVENOUS | Status: DC
  Administered 2015-02-03: 3 mL/kg/h via INTRAVENOUS

## 2015-02-03 SURGICAL SUPPLY — 12 items
CATH INFINITI 5 FR JL3.5 (CATHETERS) ×2 IMPLANT
CATH INFINITI 5FR ANG PIGTAIL (CATHETERS) ×2 IMPLANT
CATH INFINITI JR4 5F (CATHETERS) ×2 IMPLANT
CATH OPTITORQUE JACKY 4.0 5F (CATHETERS) ×2 IMPLANT
DEVICE RAD COMP TR BAND LRG (VASCULAR PRODUCTS) ×2 IMPLANT
GLIDESHEATH SLEND SS 6F .021 (SHEATH) ×2 IMPLANT
KIT HEART LEFT (KITS) ×2 IMPLANT
PACK CARDIAC CATHETERIZATION (CUSTOM PROCEDURE TRAY) ×2 IMPLANT
SYR MEDRAD MARK V 150ML (SYRINGE) ×2 IMPLANT
TRANSDUCER W/STOPCOCK (MISCELLANEOUS) ×2 IMPLANT
TUBING CIL FLEX 10 FLL-RA (TUBING) ×2 IMPLANT
WIRE SAFE-T 1.5MM-J .035X260CM (WIRE) ×2 IMPLANT

## 2015-02-03 NOTE — Discharge Instructions (Signed)
Radial Site Care °Refer to this sheet in the next few weeks. These instructions provide you with information about caring for yourself after your procedure. Your health care provider may also give you more specific instructions. Your treatment has been planned according to current medical practices, but problems sometimes occur. Call your health care provider if you have any problems or questions after your procedure. °WHAT TO EXPECT AFTER THE PROCEDURE °After your procedure, it is typical to have the following: °· Bruising at the radial site that usually fades within 1-2 weeks. °· Blood collecting in the tissue (hematoma) that may be painful to the touch. It should usually decrease in size and tenderness within 1-2 weeks. °HOME CARE INSTRUCTIONS °· Take medicines only as directed by your health care provider. °· You may shower 24-48 hours after the procedure or as directed by your health care provider. Remove the bandage (dressing) and gently wash the site with plain soap and water. Pat the area dry with a clean towel. Do not rub the site, because this may cause bleeding. °· Do not take baths, swim, or use a hot tub until your health care provider approves. °· Check your insertion site every day for redness, swelling, or drainage. °· Do not apply powder or lotion to the site. °· Do not flex or bend the affected arm for 24 hours or as directed by your health care provider. °· Do not push or pull heavy objects with the affected arm for 24 hours or as directed by your health care provider. °· Do not lift over 10 lb (4.5 kg) for 5 days after your procedure or as directed by your health care provider. °· Ask your health care provider when it is okay to: °¨ Return to work or school. °¨ Resume usual physical activities or sports. °¨ Resume sexual activity. °· Do not drive home if you are discharged the same day as the procedure. Have someone else drive you. °· You may drive 24 hours after the procedure unless otherwise  instructed by your health care provider. °· Do not operate machinery or power tools for 24 hours after the procedure. °· If your procedure was done as an outpatient procedure, which means that you went home the same day as your procedure, a responsible adult should be with you for the first 24 hours after you arrive home. °· Keep all follow-up visits as directed by your health care provider. This is important. °SEEK MEDICAL CARE IF: °· You have a fever. °· You have chills. °· You have increased bleeding from the radial site. Hold pressure on the site. °SEEK IMMEDIATE MEDICAL CARE IF: °· You have unusual pain at the radial site. °· You have redness, warmth, or swelling at the radial site. °· You have drainage (other than a small amount of blood on the dressing) from the radial site. °· The radial site is bleeding, and the bleeding does not stop after 30 minutes of holding steady pressure on the site. °· Your arm or hand becomes pale, cool, tingly, or numb. °  °This information is not intended to replace advice given to you by your health care provider. Make sure you discuss any questions you have with your health care provider. °  °Document Released: 03/11/2010 Document Revised: 02/27/2014 Document Reviewed: 08/25/2013 °Elsevier Interactive Patient Education ©2016 [employer] °Radial Site Care °Refer to this sheet in the next few weeks. These instructions provide you with information about caring for yourself after your procedure. Your health care provider   may also give you more specific instructions. Your treatment has been planned according to current medical practices, but problems sometimes occur. Call your health care provider if you have any problems or questions after your procedure. °WHAT TO EXPECT AFTER THE PROCEDURE °After your procedure, it is typical to have the following: °· Bruising at the radial site that usually fades within 1-2 weeks. °· Blood collecting in the tissue (hematoma) that may be painful  to the touch. It should usually decrease in size and tenderness within 1-2 weeks. °HOME CARE INSTRUCTIONS °· Take medicines only as directed by your health care provider. °· You may shower 24-48 hours after the procedure or as directed by your health care provider. Remove the bandage (dressing) and gently wash the site with plain soap and water. Pat the area dry with a clean towel. Do not rub the site, because this may cause bleeding. °· Do not take baths, swim, or use a hot tub until your health care provider approves. °· Check your insertion site every day for redness, swelling, or drainage. °· Do not apply powder or lotion to the site. °· Do not flex or bend the affected arm for 24 hours or as directed by your health care provider. °· Do not push or pull heavy objects with the affected arm for 24 hours or as directed by your health care provider. °· Do not lift over 10 lb (4.5 kg) for 5 days after your procedure or as directed by your health care provider. °· Ask your health care provider when it is okay to: °¨ Return to work or school. °¨ Resume usual physical activities or sports. °¨ Resume sexual activity. °· Do not drive home if you are discharged the same day as the procedure. Have someone else drive you. °· You may drive 24 hours after the procedure unless otherwise instructed by your health care provider. °· Do not operate machinery or power tools for 24 hours after the procedure. °· If your procedure was done as an outpatient procedure, which means that you went home the same day as your procedure, a responsible adult should be with you for the first 24 hours after you arrive home. °· Keep all follow-up visits as directed by your health care provider. This is important. °SEEK MEDICAL CARE IF: °· You have a fever. °· You have chills. °· You have increased bleeding from the radial site. Hold pressure on the site. °SEEK IMMEDIATE MEDICAL CARE IF: °· You have unusual pain at the radial site. °· You have  redness, warmth, or swelling at the radial site. °· You have drainage (other than a small amount of blood on the dressing) from the radial site. °· The radial site is bleeding, and the bleeding does not stop after 30 minutes of holding steady pressure on the site. °· Your arm or hand becomes pale, cool, tingly, or numb. °  °This information is not intended to replace advice given to you by your health care provider. Make sure you discuss any questions you have with your health care provider. °  °Document Released: 03/11/2010 Document Revised: 02/27/2014 Document Reviewed: 08/25/2013 °Elsevier Interactive Patient Education ©2016 [employer] ° °

## 2015-02-03 NOTE — Interval H&P Note (Signed)
History and Physical Interval Note:  02/03/2015 8:44 AM  Michael [name]  has presented today for surgery, with the diagnosis of sob, cp  The various methods of treatment have been discussed with the patient and family. After consideration of risks, benefits and other options for treatment, the patient has consented to  Procedure(s): Left Heart Cath and Coronary Angiography (N/A) as a surgical intervention .  The patient's history has been reviewed, patient examined, no change in status, stable for surgery.  I have reviewed the patient's chart and labs.  Questions were answered to the patient's satisfaction.     [name]

## 2015-02-03 NOTE — H&P (View-Only) (Signed)
Primary care physician: Dr. Clayborn Bigness  HPI  This is a 81 year old male who is here today for consultation of exertional dyspnea and abnormal stress test. There is reported history of coronary artery disease with previous cardiac catheterization done at [facility] in 2010. The patient was told about 2 blockages but did not have revascularization done. He has chronic medical conditions that include type 2 diabetes, hypertension and possible hyperlipidemia. Recent carotid Doppler showed mild to moderate plaque formation without obstructive disease and he was started on atorvastatin. He has been complaining of dyspnea with minimal activities over the last 6 months which has been more noticeable recently. He gets out of breath when he tries to go upstairs in his bedroom. He also gets out of breath walking to the kitchen according to his daughter. He denies any chest discomfort. No orthopnea, PND or lower extremity edema. He had a treadmill stress test done which showed 1 mm of horizontal ST depression in the inferior leads.  Not on File   No current outpatient prescriptions on file prior to visit.   No current facility-administered medications on file prior to visit.     Past Medical History  Diagnosis Date  . Hypertension   . Diabetes mellitus without complication ([place])   . GERD (gastroesophageal reflux disease)   . Benign prostatic hypertrophy   . Left carotid artery stenosis   . Coronary artery disease     Previous cardiac catheterization at [facility] in 2010. The patient was told about 2 blockages which did not require revascularization.  . Essential hypertension   . Hyperlipidemia   . Bilateral carotid artery disease ([facility])     Mild plaque formation without obstructive disease noted on carotid Doppler.     Past Surgical History  Procedure Laterality Date  . Cataract extraction    . Prostatectomy    . Cardiac catheterization  2010     Family History  Problem Relation Age of Onset  .  Family history unknown: Yes     Social History   Social History  . Marital Status: Married    Spouse Name: N/A  . Number of Children: N/A  . Years of Education: N/A   Occupational History  . Not on file.   Social History Main Topics  . Smoking status: Never Smoker   . Smokeless tobacco: Not on file  . Alcohol Use: No  . Drug Use: No  . Sexual Activity: Not on file   Other Topics Concern  . Not on file   Social History Narrative     ROS A 10 point review of system was performed. It is negative other than that mentioned in the history of present illness.   PHYSICAL EXAM   BP 130/78 mmHg  Pulse 83  Ht 5\' 5"  (1.651 m)  Wt 140 lb 4 oz (63.617 kg)  BMI 23.34 kg/m2 Constitutional: He is oriented to person, place, and time. He appears well-developed and well-nourished. No distress.  HENT: No nasal discharge.  Head: Normocephalic and atraumatic.  Eyes: Pupils are equal and round.  No discharge. Neck: Normal range of motion. Neck supple. No JVD present. No thyromegaly present.  Cardiovascular: Normal rate, regular rhythm, normal heart sounds. Exam reveals no gallop and no friction rub. No murmur heard.  Pulmonary/Chest: Effort normal and breath sounds normal. No stridor. No respiratory distress. He has no wheezes. He has no rales. He exhibits no tenderness.  Abdominal: Soft. Bowel sounds are normal. He exhibits no distension. There is no tenderness.  There is no rebound and no guarding.  Musculoskeletal: Normal range of motion. He exhibits no edema and no tenderness.  Neurological: He is alert and oriented to person, place, and time. Coordination normal.  Skin: Skin is warm and dry. No rash noted. He is not diaphoretic. No erythema. No pallor.  Psychiatric: He has a normal mood and affect. His behavior is normal. Judgment and thought content normal.       EKG: Normal sinus rhythm with no significant ST or T wave changes.   ASSESSMENT AND PLAN

## 2015-02-04 MED FILL — Verapamil HCl IV Soln 2.5 MG/ML: INTRAVENOUS | Qty: 2 | Status: AC

## 2015-02-05 DIAGNOSIS — D519 Vitamin B12 deficiency anemia, unspecified: Secondary | ICD-10-CM | POA: Diagnosis not present

## 2015-02-08 DIAGNOSIS — R972 Elevated prostate specific antigen [PSA]: Secondary | ICD-10-CM | POA: Diagnosis not present

## 2015-02-11 DIAGNOSIS — M810 Age-related osteoporosis without current pathological fracture: Secondary | ICD-10-CM | POA: Diagnosis not present

## 2015-02-25 ENCOUNTER — Other Ambulatory Visit: Payer: Self-pay | Admitting: Internal Medicine

## 2015-02-25 DIAGNOSIS — I2584 Coronary atherosclerosis due to calcified coronary lesion: Secondary | ICD-10-CM | POA: Diagnosis not present

## 2015-02-25 DIAGNOSIS — R972 Elevated prostate specific antigen [PSA]: Secondary | ICD-10-CM | POA: Diagnosis not present

## 2015-02-25 DIAGNOSIS — E119 Type 2 diabetes mellitus without complications: Secondary | ICD-10-CM | POA: Diagnosis not present

## 2015-02-25 DIAGNOSIS — R05 Cough: Secondary | ICD-10-CM

## 2015-02-25 DIAGNOSIS — D519 Vitamin B12 deficiency anemia, unspecified: Secondary | ICD-10-CM | POA: Diagnosis not present

## 2015-02-25 DIAGNOSIS — R059 Cough, unspecified: Secondary | ICD-10-CM

## 2015-02-25 DIAGNOSIS — J45991 Cough variant asthma: Secondary | ICD-10-CM | POA: Diagnosis not present

## 2015-03-01 ENCOUNTER — Ambulatory Visit
Admission: RE | Admit: 2015-03-01 | Discharge: 2015-03-01 | Disposition: A | Discharge status: Home / Self Care | Payer: Medicare Other | Source: Ambulatory Visit | Attending: Internal Medicine | Admitting: Internal Medicine

## 2015-03-01 DIAGNOSIS — R0602 Shortness of breath: Secondary | ICD-10-CM | POA: Diagnosis not present

## 2015-03-01 DIAGNOSIS — I2510 Atherosclerotic heart disease of native coronary artery without angina pectoris: Secondary | ICD-10-CM | POA: Diagnosis not present

## 2015-03-01 DIAGNOSIS — R918 Other nonspecific abnormal finding of lung field: Secondary | ICD-10-CM | POA: Diagnosis not present

## 2015-03-01 DIAGNOSIS — J479 Bronchiectasis, uncomplicated: Secondary | ICD-10-CM | POA: Diagnosis not present

## 2015-03-01 DIAGNOSIS — R059 Cough, unspecified: Secondary | ICD-10-CM

## 2015-03-01 DIAGNOSIS — R05 Cough: Secondary | ICD-10-CM | POA: Diagnosis not present

## 2015-03-01 MED ORDER — IOHEXOL 300 MG/ML  SOLN
75.0000 mL | Freq: Once | INTRAMUSCULAR | Status: AC | PRN
  Administered 2015-03-01: 75 mL via INTRAVENOUS

## 2015-03-05 DIAGNOSIS — D519 Vitamin B12 deficiency anemia, unspecified: Secondary | ICD-10-CM | POA: Diagnosis not present

## 2015-03-12 ENCOUNTER — Encounter: Payer: Self-pay | Admitting: Cardiovascular Disease

## 2015-03-12 ENCOUNTER — Ambulatory Visit (INDEPENDENT_AMBULATORY_CARE_PROVIDER_SITE_OTHER): Payer: Medicare Other | Admitting: Cardiovascular Disease

## 2015-03-12 DIAGNOSIS — I25119 Atherosclerotic heart disease of native coronary artery with unspecified angina pectoris: Secondary | ICD-10-CM

## 2015-03-12 DIAGNOSIS — I10 Essential (primary) hypertension: Secondary | ICD-10-CM

## 2015-03-15 ENCOUNTER — Encounter: Payer: Self-pay | Admitting: Cardiovascular Disease

## 2015-03-15 ENCOUNTER — Ambulatory Visit (INDEPENDENT_AMBULATORY_CARE_PROVIDER_SITE_OTHER): Payer: Medicare Other | Admitting: Cardiovascular Disease

## 2015-03-15 VITALS — BP 138/70 | HR 72 | Ht 65.0 in | Wt 140.0 lb

## 2015-03-15 DIAGNOSIS — J8410 Pulmonary fibrosis, unspecified: Secondary | ICD-10-CM | POA: Diagnosis not present

## 2015-03-15 DIAGNOSIS — I10 Essential (primary) hypertension: Secondary | ICD-10-CM

## 2015-03-15 DIAGNOSIS — E785 Hyperlipidemia, unspecified: Secondary | ICD-10-CM | POA: Diagnosis not present

## 2015-03-15 DIAGNOSIS — I2510 Atherosclerotic heart disease of native coronary artery without angina pectoris: Secondary | ICD-10-CM | POA: Diagnosis not present

## 2015-03-15 DIAGNOSIS — R0602 Shortness of breath: Secondary | ICD-10-CM | POA: Diagnosis not present

## 2015-03-15 DIAGNOSIS — J479 Bronchiectasis, uncomplicated: Secondary | ICD-10-CM | POA: Diagnosis not present

## 2015-03-15 DIAGNOSIS — I25119 Atherosclerotic heart disease of native coronary artery with unspecified angina pectoris: Secondary | ICD-10-CM

## 2015-03-15 NOTE — Assessment & Plan Note (Signed)
The patient is currently on atorvastatin 10 mg once daily. Given the presence of moderate coronary artery disease, I recommend a target LDL of less than 70. The dose of atorvastatin Be increased if needed based on the response. He has a follow-up appointment with Dr. Humphrey Rolls next month.

## 2015-03-15 NOTE — Assessment & Plan Note (Signed)
Recent cardiac catheterization showed moderate one-vessel coronary artery disease involving the left anterior descending artery which was overall heavily calcified. No indication for revascularization. Continue low-dose aspirin and treatment of risk factors.

## 2015-03-15 NOTE — Progress Notes (Signed)
Primary care physician: Dr. Clayborn Bigness  HPI  This is a 82 year old male who is here today for a follow-up visit for exertional dyspnea and abnormal stress test. He has chronic medical conditions that include type 2 diabetes, hypertension and possible hyperlipidemia. Recent carotid Doppler showed mild to moderate plaque formation without obstructive disease and he was started on atorvastatin. He was seen recently for exertional dyspnea. He had a treadmill stress test done which showed 1 mm of horizontal ST depression in the inferior leads. I proceeded with cardiac catheterization last month which showed  40-50% heavily calcified stenosis in the mid to distal LAD. Ejection fraction was normal and left ventricular end-diastolic pressure was mildly elevated. He is doing reasonably well overall and denies any chest pain. Dyspnea is stable.  No Known Allergies   Current Outpatient Prescriptions on File Prior to Visit  Medication Sig Dispense Refill  . aspirin 325 MG tablet Take 325 mg by mouth daily.    [name] atorvastatin (LIPITOR) 10 MG tablet Take 10 mg by mouth daily.    . calcium gluconate 500 MG tablet Take 1 tablet by mouth daily.    . finasteride (PROSCAR) 5 MG tablet TAKE 1 TABLET (5 MG TOTAL) BY MOUTH DAILY.  3  . glimepiride (AMARYL) 1 MG tablet Take 1 mg by mouth daily with breakfast.    . lisinopril (PRINIVIL,ZESTRIL) 5 MG tablet Take 2.5 mg by mouth daily.     . metoprolol succinate (TOPROL-XL) 25 MG 24 hr tablet Take 12.5 mg by mouth daily.  6  . Multiple Vitamin (MULTIVITAMIN) tablet Take 1 tablet by mouth daily.    [name] omeprazole (PRILOSEC) 20 MG capsule Take 20 mg by mouth daily.  1   No current facility-administered medications on file prior to visit.     Past Medical History  Diagnosis Date  . Hypertension   . Diabetes mellitus without complication ([place])   . GERD (gastroesophageal reflux disease)   . Benign prostatic hypertrophy   . Left carotid artery stenosis   . Coronary  artery disease     Previous cardiac catheterization at [facility] in 2010. The patient was told about 2 blockages which did not require revascularization.  . Essential hypertension   . Hyperlipidemia   . Bilateral carotid artery disease ([facility])     Mild plaque formation without obstructive disease noted on carotid Doppler.     Past Surgical History  Procedure Laterality Date  . Cataract extraction    . Prostatectomy    . Cardiac catheterization  2010  . Cardiac catheterization N/A 02/03/2015    Procedure: Left Heart Cath and Coronary Angiography;  Surgeon: Wellington [name], MD;  Location: [facility];  Service: Cardiovascular;  Laterality: N/A;     Family History  Problem Relation Age of Onset  . Family history unknown: Yes     Social History   Social History  . Marital Status: Married    Spouse Name: N/A  . Number of Children: N/A  . Years of Education: N/A   Occupational History  . Not on file.   Social History Main Topics  . Smoking status: Never Smoker   . Smokeless tobacco: Not on file  . Alcohol Use: No  . Drug Use: No  . Sexual Activity: Not on file   Other Topics Concern  . Not on file   Social History Narrative     ROS A 10 point review of system was performed. It is negative other than that mentioned  in the history of present illness.   PHYSICAL EXAM   BP 138/70 mmHg  Pulse 72  Ht 5\' 5"  (1.651 m)  Wt 140 lb (63.504 kg)  BMI 23.30 kg/m2 Constitutional: He is oriented to person, place, and time. He appears well-developed and well-nourished. No distress.  HENT: No nasal discharge.  Head: Normocephalic and atraumatic.  Eyes: Pupils are equal and round.  No discharge. Neck: Normal range of motion. Neck supple. No JVD present. No thyromegaly present.  Cardiovascular: Normal rate, regular rhythm, normal heart sounds. Exam reveals no gallop and no friction rub. No murmur heard.  Pulmonary/Chest: Effort normal and breath sounds normal. No stridor.  No respiratory distress. He has no wheezes. He has no rales. He exhibits no tenderness.  Abdominal: Soft. Bowel sounds are normal. He exhibits no distension. There is no tenderness. There is no rebound and no guarding.  Musculoskeletal: Normal range of motion. He exhibits no edema and no tenderness.  Neurological: He is alert and oriented to person, place, and time. Coordination normal.  Skin: Skin is warm and dry. No rash noted. He is not diaphoretic. No erythema. No pallor.  Psychiatric: He has a normal mood and affect. His behavior is normal. Judgment and thought content normal.  Right radial pulse is normal with no hematoma.     EKG: Normal sinus rhythm with no significant ST or T wave changes.   ASSESSMENT AND PLAN

## 2015-03-15 NOTE — Patient Instructions (Signed)
Medication Instructions: Continue same medications.   Labwork: None.   Procedures/Testing: None.   Follow-Up: 6 months with Dr. Arida.   Any Additional Special Instructions Will Be Listed Below (If Applicable).     If you need a refill on your cardiac medications before your next appointment, please call your pharmacy.   

## 2015-03-15 NOTE — Assessment & Plan Note (Signed)
Blood pressure is reasonably controlled on current medications. 

## 2015-03-15 NOTE — Progress Notes (Signed)
Rescheduled appointment due to provider's emergency.

## 2015-03-17 DIAGNOSIS — R0602 Shortness of breath: Secondary | ICD-10-CM | POA: Diagnosis not present

## 2015-03-25 DIAGNOSIS — I10 Essential (primary) hypertension: Secondary | ICD-10-CM | POA: Diagnosis not present

## 2015-03-25 DIAGNOSIS — D519 Vitamin B12 deficiency anemia, unspecified: Secondary | ICD-10-CM | POA: Diagnosis not present

## 2015-03-25 DIAGNOSIS — J479 Bronchiectasis, uncomplicated: Secondary | ICD-10-CM | POA: Diagnosis not present

## 2015-03-25 DIAGNOSIS — J45991 Cough variant asthma: Secondary | ICD-10-CM | POA: Diagnosis not present

## 2015-03-25 DIAGNOSIS — E119 Type 2 diabetes mellitus without complications: Secondary | ICD-10-CM | POA: Diagnosis not present

## 2015-03-25 DIAGNOSIS — I2584 Coronary atherosclerosis due to calcified coronary lesion: Secondary | ICD-10-CM | POA: Diagnosis not present

## 2015-03-25 DIAGNOSIS — E782 Mixed hyperlipidemia: Secondary | ICD-10-CM | POA: Diagnosis not present

## 2015-03-29 DIAGNOSIS — J479 Bronchiectasis, uncomplicated: Secondary | ICD-10-CM | POA: Diagnosis not present

## 2015-03-29 DIAGNOSIS — R0602 Shortness of breath: Secondary | ICD-10-CM | POA: Diagnosis not present

## 2015-03-29 DIAGNOSIS — J449 Chronic obstructive pulmonary disease, unspecified: Secondary | ICD-10-CM | POA: Diagnosis not present

## 2015-09-28 DIAGNOSIS — N3941 Urge incontinence: Secondary | ICD-10-CM | POA: Diagnosis not present

## 2015-09-28 DIAGNOSIS — N401 Enlarged prostate with lower urinary tract symptoms: Secondary | ICD-10-CM | POA: Diagnosis not present

## 2015-09-28 DIAGNOSIS — N4340 Spermatocele of epididymis, unspecified: Secondary | ICD-10-CM | POA: Diagnosis not present

## 2015-09-28 DIAGNOSIS — R972 Elevated prostate specific antigen [PSA]: Secondary | ICD-10-CM | POA: Diagnosis not present

## 2015-10-21 DIAGNOSIS — J449 Chronic obstructive pulmonary disease, unspecified: Secondary | ICD-10-CM | POA: Diagnosis not present

## 2015-10-21 DIAGNOSIS — I10 Essential (primary) hypertension: Secondary | ICD-10-CM | POA: Diagnosis not present

## 2015-10-21 DIAGNOSIS — E119 Type 2 diabetes mellitus without complications: Secondary | ICD-10-CM | POA: Diagnosis not present

## 2015-10-21 DIAGNOSIS — J470 Bronchiectasis with acute lower respiratory infection: Secondary | ICD-10-CM | POA: Diagnosis not present

## 2015-12-03 ENCOUNTER — Ambulatory Visit: Admission: RE | Admit: 2015-12-03 | Payer: Self-pay | Source: Ambulatory Visit

## 2015-12-03 ENCOUNTER — Other Ambulatory Visit: Payer: Self-pay | Admitting: Internal Medicine

## 2015-12-03 DIAGNOSIS — R059 Cough, unspecified: Secondary | ICD-10-CM

## 2015-12-03 DIAGNOSIS — R05 Cough: Secondary | ICD-10-CM

## 2015-12-06 ENCOUNTER — Ambulatory Visit
Admission: RE | Admit: 2015-12-06 | Discharge: 2015-12-06 | Disposition: A | Discharge status: Home / Self Care | Payer: Medicare Other | Source: Ambulatory Visit | Attending: Internal Medicine | Admitting: Internal Medicine

## 2015-12-06 DIAGNOSIS — R05 Cough: Secondary | ICD-10-CM

## 2015-12-06 DIAGNOSIS — R059 Cough, unspecified: Secondary | ICD-10-CM

## 2015-12-06 DIAGNOSIS — R918 Other nonspecific abnormal finding of lung field: Secondary | ICD-10-CM | POA: Diagnosis not present

## 2015-12-13 ENCOUNTER — Other Ambulatory Visit: Payer: Self-pay | Admitting: Internal Medicine

## 2015-12-13 DIAGNOSIS — R059 Cough, unspecified: Secondary | ICD-10-CM

## 2015-12-13 DIAGNOSIS — R05 Cough: Secondary | ICD-10-CM

## 2015-12-13 DIAGNOSIS — J479 Bronchiectasis, uncomplicated: Secondary | ICD-10-CM | POA: Diagnosis not present

## 2015-12-13 DIAGNOSIS — J449 Chronic obstructive pulmonary disease, unspecified: Secondary | ICD-10-CM | POA: Diagnosis not present

## 2015-12-13 DIAGNOSIS — J470 Bronchiectasis with acute lower respiratory infection: Secondary | ICD-10-CM

## 2015-12-13 DIAGNOSIS — Z23 Encounter for immunization: Secondary | ICD-10-CM | POA: Diagnosis not present

## 2015-12-17 ENCOUNTER — Telehealth: Payer: Self-pay | Admitting: Cardiovascular Disease

## 2015-12-17 NOTE — Telephone Encounter (Signed)
3 attempts to schedule fu from recall list.  Deleting recall.  °

## 2015-12-20 ENCOUNTER — Ambulatory Visit: Admission: RE | Admit: 2015-12-20 | Payer: Medicare Other | Source: Ambulatory Visit

## 2015-12-20 ENCOUNTER — Ambulatory Visit
Admission: RE | Admit: 2015-12-20 | Discharge: 2015-12-20 | Disposition: A | Discharge status: Home / Self Care | Payer: Medicare Other | Source: Ambulatory Visit | Attending: Internal Medicine | Admitting: Internal Medicine

## 2015-12-20 DIAGNOSIS — J470 Bronchiectasis with acute lower respiratory infection: Secondary | ICD-10-CM

## 2015-12-22 ENCOUNTER — Other Ambulatory Visit: Payer: Self-pay | Admitting: Physician Assistant

## 2015-12-22 DIAGNOSIS — R05 Cough: Secondary | ICD-10-CM

## 2015-12-22 DIAGNOSIS — R053 Chronic cough: Secondary | ICD-10-CM

## 2015-12-31 DIAGNOSIS — E119 Type 2 diabetes mellitus without complications: Secondary | ICD-10-CM | POA: Diagnosis not present

## 2016-01-10 ENCOUNTER — Ambulatory Visit: Admission: RE | Admit: 2016-01-10 | Payer: Medicare Other | Source: Ambulatory Visit

## 2016-02-28 DIAGNOSIS — Z23 Encounter for immunization: Secondary | ICD-10-CM | POA: Diagnosis not present

## 2016-03-03 DIAGNOSIS — E11621 Type 2 diabetes mellitus with foot ulcer: Secondary | ICD-10-CM | POA: Diagnosis not present

## 2016-03-03 DIAGNOSIS — Z0001 Encounter for general adult medical examination with abnormal findings: Secondary | ICD-10-CM | POA: Diagnosis not present

## 2016-03-03 DIAGNOSIS — E782 Mixed hyperlipidemia: Secondary | ICD-10-CM | POA: Diagnosis not present

## 2016-03-03 DIAGNOSIS — I10 Essential (primary) hypertension: Secondary | ICD-10-CM | POA: Diagnosis not present

## 2016-03-13 DIAGNOSIS — Z1159 Encounter for screening for other viral diseases: Secondary | ICD-10-CM | POA: Diagnosis not present

## 2016-03-13 DIAGNOSIS — I10 Essential (primary) hypertension: Secondary | ICD-10-CM | POA: Diagnosis not present

## 2016-03-13 DIAGNOSIS — E1165 Type 2 diabetes mellitus with hyperglycemia: Secondary | ICD-10-CM | POA: Diagnosis not present

## 2016-03-13 DIAGNOSIS — E782 Mixed hyperlipidemia: Secondary | ICD-10-CM | POA: Diagnosis not present

## 2016-03-13 DIAGNOSIS — Z0001 Encounter for general adult medical examination with abnormal findings: Secondary | ICD-10-CM | POA: Diagnosis not present

## 2016-08-22 DIAGNOSIS — E782 Mixed hyperlipidemia: Secondary | ICD-10-CM | POA: Diagnosis not present

## 2016-08-22 DIAGNOSIS — E11621 Type 2 diabetes mellitus with foot ulcer: Secondary | ICD-10-CM | POA: Diagnosis not present

## 2016-08-22 DIAGNOSIS — I10 Essential (primary) hypertension: Secondary | ICD-10-CM | POA: Diagnosis not present

## 2016-09-28 DIAGNOSIS — E119 Type 2 diabetes mellitus without complications: Secondary | ICD-10-CM | POA: Diagnosis not present

## 2016-09-28 DIAGNOSIS — J449 Chronic obstructive pulmonary disease, unspecified: Secondary | ICD-10-CM | POA: Diagnosis not present

## 2016-09-28 DIAGNOSIS — D649 Anemia, unspecified: Secondary | ICD-10-CM | POA: Diagnosis not present

## 2016-09-28 DIAGNOSIS — E782 Mixed hyperlipidemia: Secondary | ICD-10-CM | POA: Diagnosis not present

## 2016-09-28 DIAGNOSIS — I10 Essential (primary) hypertension: Secondary | ICD-10-CM | POA: Diagnosis not present

## 2016-11-10 DIAGNOSIS — J209 Acute bronchitis, unspecified: Secondary | ICD-10-CM | POA: Diagnosis not present

## 2016-11-10 DIAGNOSIS — I10 Essential (primary) hypertension: Secondary | ICD-10-CM | POA: Diagnosis not present

## 2016-11-10 DIAGNOSIS — E119 Type 2 diabetes mellitus without complications: Secondary | ICD-10-CM | POA: Diagnosis not present

## 2016-11-10 DIAGNOSIS — J479 Bronchiectasis, uncomplicated: Secondary | ICD-10-CM | POA: Diagnosis not present

## 2016-12-13 DIAGNOSIS — J209 Acute bronchitis, unspecified: Secondary | ICD-10-CM | POA: Diagnosis not present

## 2016-12-13 DIAGNOSIS — J479 Bronchiectasis, uncomplicated: Secondary | ICD-10-CM | POA: Diagnosis not present

## 2016-12-13 DIAGNOSIS — I10 Essential (primary) hypertension: Secondary | ICD-10-CM | POA: Diagnosis not present

## 2016-12-13 DIAGNOSIS — E119 Type 2 diabetes mellitus without complications: Secondary | ICD-10-CM | POA: Diagnosis not present

## 2016-12-14 ENCOUNTER — Other Ambulatory Visit: Payer: Self-pay | Admitting: Nurse Practitioner

## 2016-12-14 ENCOUNTER — Ambulatory Visit
Admission: RE | Admit: 2016-12-14 | Discharge: 2016-12-14 | Disposition: A | Discharge status: Home / Self Care | Payer: Medicare Other | Source: Ambulatory Visit | Attending: Nurse Practitioner | Admitting: Nurse Practitioner

## 2016-12-14 DIAGNOSIS — R0602 Shortness of breath: Secondary | ICD-10-CM | POA: Diagnosis not present

## 2016-12-14 DIAGNOSIS — J984 Other disorders of lung: Secondary | ICD-10-CM | POA: Diagnosis not present

## 2016-12-14 DIAGNOSIS — R059 Cough, unspecified: Secondary | ICD-10-CM

## 2016-12-14 DIAGNOSIS — R05 Cough: Secondary | ICD-10-CM | POA: Insufficient documentation

## 2017-02-07 ENCOUNTER — Other Ambulatory Visit: Payer: Self-pay | Admitting: Nurse Practitioner

## 2017-02-07 MED ORDER — CETIRIZINE HCL 10 MG PO TABS: 10.0000 mg | ORAL_TABLET | Freq: Every day | ORAL | 6 refills | Status: DC

## 2017-02-08 ENCOUNTER — Other Ambulatory Visit: Payer: Self-pay | Admitting: Nurse Practitioner

## 2017-03-23 DIAGNOSIS — C61 Malignant neoplasm of prostate: Secondary | ICD-10-CM

## 2017-03-23 HISTORY — DX: Malignant neoplasm of prostate: C61

## 2017-04-05 ENCOUNTER — Encounter: Payer: Self-pay | Admitting: Nurse Practitioner

## 2017-04-05 ENCOUNTER — Ambulatory Visit: Payer: Self-pay | Admitting: Nurse Practitioner

## 2017-04-05 ENCOUNTER — Ambulatory Visit (INDEPENDENT_AMBULATORY_CARE_PROVIDER_SITE_OTHER): Payer: Medicare Other | Admitting: Nurse Practitioner

## 2017-04-05 VITALS — BP 140/80 | HR 101 | Resp 16 | Ht 65.0 in | Wt 137.0 lb

## 2017-04-05 DIAGNOSIS — N3001 Acute cystitis with hematuria: Secondary | ICD-10-CM | POA: Diagnosis not present

## 2017-04-05 DIAGNOSIS — N390 Urinary tract infection, site not specified: Secondary | ICD-10-CM

## 2017-04-05 DIAGNOSIS — IMO0002 Reserved for concepts with insufficient information to code with codable children: Secondary | ICD-10-CM | POA: Insufficient documentation

## 2017-04-05 DIAGNOSIS — I10 Essential (primary) hypertension: Secondary | ICD-10-CM | POA: Diagnosis not present

## 2017-04-05 DIAGNOSIS — R300 Dysuria: Secondary | ICD-10-CM

## 2017-04-05 DIAGNOSIS — R319 Hematuria, unspecified: Secondary | ICD-10-CM

## 2017-04-05 DIAGNOSIS — E1165 Type 2 diabetes mellitus with hyperglycemia: Secondary | ICD-10-CM

## 2017-04-05 LAB — POCT URINALYSIS DIPSTICK
Bilirubin, UA: NEGATIVE
Glucose, UA: NEGATIVE
Ketones, UA: NEGATIVE
Leukocytes, UA: NEGATIVE
Nitrite, UA: NEGATIVE
Spec Grav, UA: 1.02 (ref 1.010–1.025)
Urobilinogen, UA: 0.2 E.U./dL
pH, UA: 7 (ref 5.0–8.0)

## 2017-04-05 LAB — POCT GLYCOSYLATED HEMOGLOBIN (HGB A1C): Hemoglobin A1C: 9.3

## 2017-04-05 MED ORDER — AMOXICILLIN-POT CLAVULANATE 875-125 MG PO TABS: 1.0000 | ORAL_TABLET | Freq: Two times a day (BID) | ORAL | 0 refills | Status: DC

## 2017-04-05 MED ORDER — GLIMEPIRIDE 4 MG PO TABS: 4.0000 mg | ORAL_TABLET | Freq: Two times a day (BID) | ORAL | 3 refills | Status: DC

## 2017-04-05 NOTE — Progress Notes (Signed)
[facility] [address], [place] [address]  Internal MEDICINE  Office Visit Note  Patient Name: Michael [name]  [id number]  [id number]  Date of Service: 04/15/2017  Chief Complaint  Patient presents with  . Back Pain    urinary frequency  . Diabetes    Back Pain  This is a chronic problem. The current episode started more than 1 month ago. The problem occurs constantly. The problem is unchanged. The pain is present in the sacro-iliac. The quality of the pain is described as aching. The pain does not radiate. The pain is at a severity of 4/10. The pain is moderate. The pain is the same all the time. The symptoms are aggravated by sitting, twisting and bending. Stiffness is present in the morning. Pertinent negatives include no chest pain. (Urinary frequency - only going a little bit at a time. ) He has tried analgesics, ice and NSAIDs for the symptoms. The treatment provided mild relief.  Diabetes  He presents for his follow-up diabetic visit. He has type 2 diabetes mellitus. No MedicAlert identification noted. His disease course has been worsening. Hypoglycemia symptoms include dizziness. Associated symptoms include fatigue, polydipsia and polyuria. Pertinent negatives for diabetes include no chest pain. There are no hypoglycemic complications. Symptoms are worsening. There are no diabetic complications. Risk factors for coronary artery disease include diabetes mellitus, dyslipidemia and hypertension. Current diabetic treatment includes oral agent (monotherapy). He is compliant with treatment all of the time. His weight is stable. He is following a generally healthy diet. He has not had a previous visit with a dietitian. He participates in exercise intermittently. His home blood glucose trend is increasing steadily. An ACE inhibitor/angiotensin II receptor blocker is being taken. He does not see a podiatrist.Eye exam is current.    Pt is here for routine follow up.     Current Medication: No facility-administered encounter medications on file as of 04/05/2017.    Outpatient Encounter Medications as of 04/05/2017  Medication Sig  . atorvastatin (LIPITOR) 10 MG tablet Take 10 mg by mouth daily.  . cetirizine (ZYRTEC) 10 MG tablet Take 1 tablet (10 mg total) by mouth daily.  . finasteride (PROSCAR) 5 MG tablet TAKE 1 TABLET (5 MG TOTAL) BY MOUTH DAILY.  [name] glimepiride (AMARYL) 4 MG tablet Take 1 tablet (4 mg total) by mouth 2 (two) times daily.  . metoprolol succinate (TOPROL-XL) 25 MG 24 hr tablet Take 25 mg by mouth daily.   [name] omeprazole (PRILOSEC) 20 MG capsule Take 20 mg by mouth daily.  . [DISCONTINUED] aspirin 325 MG tablet Take 325 mg by mouth daily.  . [DISCONTINUED] calcium gluconate 500 MG tablet Take 1 tablet by mouth daily.  . [DISCONTINUED] glimepiride (AMARYL) 1 MG tablet Take 1 mg by mouth daily with breakfast.  . [DISCONTINUED] lisinopril (PRINIVIL,ZESTRIL) 5 MG tablet Take 2.5 mg by mouth daily.   . [DISCONTINUED] Multiple Vitamin (MULTIVITAMIN) tablet Take 1 tablet by mouth daily.  . [DISCONTINUED] amoxicillin-clavulanate (AUGMENTIN) 875-125 MG tablet Take 1 tablet by mouth 2 (two) times daily. (Patient not taking: Reported on 04/11/2017)    Surgical History: Past Surgical History:  Procedure Laterality Date  . CARDIAC CATHETERIZATION  2010  . CARDIAC CATHETERIZATION N/A 02/03/2015   Procedure: Left Heart Cath and Coronary Angiography;  Surgeon: Wellington [name], MD;  Location: [place] [facility];  Service: Cardiovascular;  Laterality: N/A;  . CATARACT EXTRACTION    . CYSTOSCOPY W/ RETROGRADES Bilateral 04/11/2017   Procedure: CYSTOSCOPY WITH  RETROGRADE PYELOGRAM;  Surgeon: [name], MD;  Location: [facility];  Service: Urology;  Laterality: Bilateral;  . CYSTOSCOPY WITH STENT PLACEMENT Left 04/11/2017   Procedure: CYSTOSCOPY WITH STENT PLACEMENT and fulgeration;  Surgeon: [name], MD;  Location: [facility];  Service: Urology;   Laterality: Left;  . CYSTOSCOPY WITH URETHRAL DILATATION Bilateral 04/11/2017   Procedure: CYSTOSCOPY WITH URETHRAL DILATATION;  Surgeon: [name], MD;  Location: [facility];  Service: Urology;  Laterality: Bilateral;  . IR NEPHROSTOMY PLACEMENT RIGHT  04/13/2017  . PROSTATECTOMY      Medical History: Past Medical History:  Diagnosis Date  . Benign prostatic hypertrophy   . Bilateral carotid artery disease (HCC)    Mild plaque formation without obstructive disease noted on carotid Doppler.  . Coronary artery disease    Previous cardiac catheterization at [facility] in 2010. The patient was told about 2 blockages which did not require revascularization.  . Diabetes mellitus without complication ([place])   . Essential hypertension   . GERD (gastroesophageal reflux disease)   . Hyperlipidemia   . Hypertension   . Left carotid artery stenosis     Family History: Family History  Problem Relation Age of Onset  . Hypertension Father     Social History   Socioeconomic History  . Marital status: Widowed    Spouse name: Not on file  . Number of children: Not on file  . Years of education: Not on file  . Highest education level: Not on file  Social Needs  . Financial resource strain: Not on file  . Food insecurity - worry: Not on file  . Food insecurity - inability: Not on file  . Transportation needs - medical: Not on file  . Transportation needs - non-medical: Not on file  Occupational History  . Not on file  Tobacco Use  . Smoking status: Never Smoker  . Smokeless tobacco: Never Used  Substance and Sexual Activity  . Alcohol use: No  . Drug use: No  . Sexual activity: Not on file  Other Topics Concern  . Not on file  Social History Narrative  . Not on file      Review of Systems  Constitutional: Positive for fatigue. Negative for activity change, chills and unexpected weight change.  HENT: Negative for congestion, postnasal drip, rhinorrhea and sinus pressure.    Eyes: Negative.   Respiratory: Negative for cough and wheezing.   Cardiovascular: Negative for chest pain and palpitations.  Gastrointestinal: Negative for constipation, diarrhea, nausea and vomiting.  Endocrine: Positive for polydipsia and polyuria.  Genitourinary: Positive for flank pain, frequency and hematuria.  Musculoskeletal: Positive for back pain.  Skin: Negative for rash.  Allergic/Immunologic: Positive for environmental allergies. Negative for food allergies and immunocompromised state.  Neurological: Positive for dizziness.  Hematological: Negative for adenopathy. Does not bruise/bleed easily.  Psychiatric/Behavioral: Negative.     Today's Vitals   04/05/17 1028  BP: 140/80  Pulse: (!) 101  Resp: 16  SpO2: 99%  Weight: 137 lb (62.1 kg)  Height: 5\' 5"  (1.651 m)    Physical Exam  Constitutional: He is oriented to person, place, and time. He appears well-developed and well-nourished. No distress.  HENT:  Head: Normocephalic and atraumatic.  Nose: Rhinorrhea present.  Mouth/Throat: Oropharynx is clear and moist. No oropharyngeal exudate.  Eyes: EOM are normal. Pupils are equal, round, and reactive to light.  Neck: Normal range of motion. Neck supple. No JVD present. No tracheal deviation present. No thyromegaly present.  Cardiovascular: Normal rate,  regular rhythm and normal heart sounds. Exam reveals no gallop and no friction rub.  No murmur heard. Pulmonary/Chest: Effort normal. No respiratory distress. He has no wheezes. He has no rales. He exhibits no tenderness.  Abdominal: Soft. Bowel sounds are normal. There is no tenderness.  Genitourinary:  Genitourinary Comments: Urine sample positive for small blood annd trace protein.  Musculoskeletal: Normal range of motion.  Lymphadenopathy:    He has no cervical adenopathy.  Neurological: He is alert and oriented to person, place, and time. No cranial nerve deficit.  Skin: Skin is warm and dry. He is not diaphoretic.   Psychiatric: He has a normal mood and affect. His behavior is normal. Judgment and thought content normal.  Nursing note and vitals reviewed.   Assessment/Plan:  1. Urinary tract infection with hematuria, site unspecified U/a positive for trace WBC and small blood. Will send for culture annd sensitivity and treat as indicated.  - CULTURE, URINE COMPREHENSIVE  2. Acute cystitis with hematuria U/a positive for trace wbc nd small blood.   3. Uncontrolled type 2 diabetes mellitus with hyperglycemia (HCC) - POCT HgB A1C 9.3 today. May be contributing to increased thirst and urinary frequency. Increase glimepiride. Continue to monitor blood sugars closely.  - glimepiride (AMARYL) 4 MG tablet; Take 1 tablet (4 mg total) by mouth 2 (two) times daily.  Dispense: 60 tablet; Refill: 3  4. Essential hypertension Generally stable continue bp medication as prescribed.   5. Dysuria - POCT Urinalysis Dipstick   General Counseling: Gordhanbha verbalizes understanding of the findings of todays visit and agrees with plan of treatment. I have discussed any further diagnostic evaluation that may be needed or ordered today. We also reviewed his medications today. he has been encouraged to call the office with any questions or concerns that should arise related to todays visit.   Diabetes Counseling:  1. Addition of ACE inh/ ARB'S for nephroprotection. 2. Diabetic foot care, prevention of complications.  3.Exercise and lose weight.  4. Diabetic eye examination, 5. Monitor blood sugar closlely. nutrition counseling.  6.Sign and symptoms of hypoglycemia including shaking sweating,confusion and headaches.   This patient was seen by Leretha Pol, FNP- C in Collaboration with Dr [name] as a part of collaborative care agreement  Orders Placed This Encounter  Procedures  . CULTURE, URINE COMPREHENSIVE  . POCT HgB A1C  . POCT Urinalysis Dipstick    Meds ordered this encounter  Medications   . glimepiride (AMARYL) 4 MG tablet    Sig: Take 1 tablet (4 mg total) by mouth 2 (two) times daily.    Dispense:  60 tablet    Refill:  3    Please note increased dose    Order Specific Question:   Supervising Provider    Answer:   [name] [[id number]]  . DISCONTD: amoxicillin-clavulanate (AUGMENTIN) 875-125 MG tablet    Sig: Take 1 tablet by mouth 2 (two) times daily.    Dispense:  20 tablet    Refill:  0    Order Specific Question:   Supervising Provider    Answer:   [name] [[id number]]    Time spent: 28 Minutes   Dr [name] Internal medicine

## 2017-04-08 ENCOUNTER — Other Ambulatory Visit: Payer: Self-pay

## 2017-04-08 ENCOUNTER — Emergency Department: Payer: Medicare Other

## 2017-04-08 ENCOUNTER — Encounter: Payer: Self-pay | Admitting: Emergency Medicine

## 2017-04-08 ENCOUNTER — Emergency Department
Admission: EM | Admit: 2017-04-08 | Discharge: 2017-04-08 | Disposition: A | Discharge status: Home / Self Care | Payer: Medicare Other | Source: Home / Self Care | Attending: Emergency Medicine | Admitting: Emergency Medicine

## 2017-04-08 DIAGNOSIS — E119 Type 2 diabetes mellitus without complications: Secondary | ICD-10-CM

## 2017-04-08 DIAGNOSIS — E1151 Type 2 diabetes mellitus with diabetic peripheral angiopathy without gangrene: Secondary | ICD-10-CM | POA: Diagnosis not present

## 2017-04-08 DIAGNOSIS — I2510 Atherosclerotic heart disease of native coronary artery without angina pectoris: Secondary | ICD-10-CM

## 2017-04-08 DIAGNOSIS — R000 Tachycardia, unspecified: Secondary | ICD-10-CM | POA: Diagnosis not present

## 2017-04-08 DIAGNOSIS — R42 Dizziness and giddiness: Secondary | ICD-10-CM | POA: Diagnosis not present

## 2017-04-08 DIAGNOSIS — I10 Essential (primary) hypertension: Secondary | ICD-10-CM

## 2017-04-08 DIAGNOSIS — N131 Hydronephrosis with ureteral stricture, not elsewhere classified: Secondary | ICD-10-CM | POA: Diagnosis not present

## 2017-04-08 DIAGNOSIS — J101 Influenza due to other identified influenza virus with other respiratory manifestations: Secondary | ICD-10-CM | POA: Insufficient documentation

## 2017-04-08 DIAGNOSIS — E860 Dehydration: Secondary | ICD-10-CM | POA: Diagnosis not present

## 2017-04-08 DIAGNOSIS — Z7984 Long term (current) use of oral hypoglycemic drugs: Secondary | ICD-10-CM | POA: Insufficient documentation

## 2017-04-08 DIAGNOSIS — R05 Cough: Secondary | ICD-10-CM | POA: Diagnosis not present

## 2017-04-08 DIAGNOSIS — R1030 Lower abdominal pain, unspecified: Secondary | ICD-10-CM | POA: Diagnosis not present

## 2017-04-08 DIAGNOSIS — C7911 Secondary malignant neoplasm of bladder: Secondary | ICD-10-CM | POA: Diagnosis not present

## 2017-04-08 DIAGNOSIS — R109 Unspecified abdominal pain: Secondary | ICD-10-CM | POA: Diagnosis not present

## 2017-04-08 DIAGNOSIS — C786 Secondary malignant neoplasm of retroperitoneum and peritoneum: Secondary | ICD-10-CM | POA: Diagnosis not present

## 2017-04-08 DIAGNOSIS — Z79899 Other long term (current) drug therapy: Secondary | ICD-10-CM

## 2017-04-08 DIAGNOSIS — R100 Acute abdomen: Secondary | ICD-10-CM | POA: Diagnosis not present

## 2017-04-08 DIAGNOSIS — N179 Acute kidney failure, unspecified: Secondary | ICD-10-CM | POA: Diagnosis not present

## 2017-04-08 LAB — CBC
HCT: 27.4 % — ABNORMAL LOW (ref 40.0–52.0)
Hemoglobin: 9 g/dL — ABNORMAL LOW (ref 13.0–18.0)
MCH: 27.5 pg (ref 26.0–34.0)
MCHC: 32.7 g/dL (ref 32.0–36.0)
MCV: 84.1 fL (ref 80.0–100.0)
Platelets: 273 10*3/uL (ref 150–440)
RBC: 3.26 MIL/uL — ABNORMAL LOW (ref 4.40–5.90)
RDW: 14.4 % (ref 11.5–14.5)
WBC: 5.2 10*3/uL (ref 3.8–10.6)

## 2017-04-08 LAB — URINALYSIS, COMPLETE (UACMP) WITH MICROSCOPIC
Bacteria, UA: NONE SEEN
Bilirubin Urine: NEGATIVE
Glucose, UA: >=500 mg/dL — AB
Ketones, ur: NEGATIVE mg/dL
Leukocytes, UA: NEGATIVE
Nitrite: NEGATIVE
Protein, ur: 30 mg/dL — AB
Specific Gravity, Urine: 1.012 (ref 1.005–1.030)
pH: 5 (ref 5.0–8.0)

## 2017-04-08 LAB — PROTIME-INR
INR: 1.28
Prothrombin Time: 15.9 seconds — ABNORMAL HIGH (ref 11.4–15.2)

## 2017-04-08 LAB — COMPREHENSIVE METABOLIC PANEL
ALT: 17 U/L (ref 17–63)
AST: 44 U/L — ABNORMAL HIGH (ref 15–41)
Albumin: 3.1 g/dL — ABNORMAL LOW (ref 3.5–5.0)
Alkaline Phosphatase: 111 U/L (ref 38–126)
Anion gap: 10 (ref 5–15)
BUN: 21 mg/dL — ABNORMAL HIGH (ref 6–20)
CO2: 20 mmol/L — ABNORMAL LOW (ref 22–32)
Calcium: 8.6 mg/dL — ABNORMAL LOW (ref 8.9–10.3)
Chloride: 100 mmol/L — ABNORMAL LOW (ref 101–111)
Creatinine, Ser: 1.23 mg/dL (ref 0.61–1.24)
GFR calc Af Amer: >60 mL/min (ref >60)
GFR calc non Af Amer: 53 mL/min — ABNORMAL LOW (ref >60)
Glucose, Bld: 239 mg/dL — ABNORMAL HIGH (ref 65–99)
Potassium: 3.6 mmol/L (ref 3.5–5.1)
Sodium: 130 mmol/L — ABNORMAL LOW (ref 135–145)
Total Bilirubin: 0.5 mg/dL (ref 0.3–1.2)
Total Protein: 7.7 g/dL (ref 6.5–8.1)

## 2017-04-08 LAB — LACTIC ACID, PLASMA: Lactic Acid, Venous: 1 mmol/L (ref 0.5–1.9)

## 2017-04-08 LAB — INFLUENZA PANEL BY PCR (TYPE A & B)
Influenza A By PCR: POSITIVE — AB
Influenza B By PCR: NEGATIVE

## 2017-04-08 LAB — GLUCOSE, CAPILLARY: Glucose-Capillary: 247 mg/dL — ABNORMAL HIGH (ref 65–99)

## 2017-04-08 MED ORDER — OSELTAMIVIR PHOSPHATE 75 MG PO CAPS
75.0000 mg | ORAL_CAPSULE | Freq: Once | ORAL | Status: AC
  Administered 2017-04-08: 75 mg via ORAL
  Filled 2017-04-08: qty 1

## 2017-04-08 MED ORDER — PIPERACILLIN-TAZOBACTAM 3.375 G IVPB 30 MIN
3.3750 g | Freq: Once | INTRAVENOUS | Status: AC
  Administered 2017-04-08: 3.375 g via INTRAVENOUS
  Filled 2017-04-08: qty 50

## 2017-04-08 MED ORDER — VANCOMYCIN HCL IN DEXTROSE 1-5 GM/200ML-% IV SOLN
1000.0000 mg | Freq: Once | INTRAVENOUS | Status: AC
  Administered 2017-04-08: 1000 mg via INTRAVENOUS
  Filled 2017-04-08: qty 200

## 2017-04-08 MED ORDER — OSELTAMIVIR PHOSPHATE 75 MG PO CAPS: 75.0000 mg | ORAL_CAPSULE | Freq: Two times a day (BID) | ORAL | 0 refills | Status: DC

## 2017-04-08 MED ORDER — SODIUM CHLORIDE 0.9 % IV BOLUS (SEPSIS)
1000.0000 mL | Freq: Once | INTRAVENOUS | Status: AC
  Administered 2017-04-08: 1000 mL via INTRAVENOUS

## 2017-04-08 NOTE — ED Provider Notes (Signed)
[facility] Emergency Department Provider Note ____________________________________________   I have reviewed the triage vital signs and the triage nursing note.  HISTORY  Chief Complaint Flank Pain   Historian Patient and daughter  HPI Michael [name] is a 84 y.o. male presenting here for reexamination after symptoms not improving since onset around Wednesday of this week.  Sound like symptom started with suprapubic discomfort and bilateral low flank pain.  It is unclear to me if he actually had a urinalysis consistent with UTI, but it sounds like they were treating him for possible UTI or possible infection and gave him Augmentin.  He has been on that for several days now.  Either as a result of the antibiotic, or illness, he has had decreased p.o. intake overall.  He is been reporting frequency of urination about every hour, with not very much coming out.  Fever started this morning 101 at home documented.  His daughter gave him Tylenol prior to coming to the emergency department.  He is also had some dizziness over the same time course.  He has had a cough without productive of sputum.  Daughter states that he has had loose bowel movements.  No reported blood there.  Symptoms are moderate.  Nothing seems to make it worse or better.  Daughter checks his sugar, and sugars have been elevated in the mornings from just over 100 to about 170s.     Past Medical History:  Diagnosis Date  . Benign prostatic hypertrophy   . Bilateral carotid artery disease (HCC)    Mild plaque formation without obstructive disease noted on carotid Doppler.  . Coronary artery disease    Previous cardiac catheterization at [facility] in 2010. The patient was told about 2 blockages which did not require revascularization.  . Diabetes mellitus without complication ([place])   . Essential hypertension   . GERD (gastroesophageal reflux disease)   . Hyperlipidemia   . Hypertension    . Left carotid artery stenosis     Patient Active Problem List   Diagnosis Date Noted  . Type II diabetes mellitus, uncontrolled ([place]) 04/05/2017  . Coronary artery disease involving native coronary artery of native heart without angina pectoris 03/15/2015  . Coronary artery disease involving native coronary artery with angina pectoris ([place]) 01/22/2015  . Essential hypertension   . Hyperlipidemia     Past Surgical History:  Procedure Laterality Date  . CARDIAC CATHETERIZATION  2010  . CARDIAC CATHETERIZATION N/A 02/03/2015   Procedure: Left Heart Cath and Coronary Angiography;  Surgeon: Wellington [name], MD;  Location: [place] [facility];  Service: Cardiovascular;  Laterality: N/A;  . CATARACT EXTRACTION    . PROSTATECTOMY      Prior to Admission medications   Medication Sig Start Date End Date Taking? Authorizing Provider  amoxicillin-clavulanate (AUGMENTIN) 875-125 MG tablet Take 1 tablet by mouth 2 (two) times daily. 04/05/17  Yes Boscia, Greer Ee, NP  atorvastatin (LIPITOR) 10 MG tablet Take 10 mg by mouth daily.   Yes [provider]  calcium gluconate 500 MG tablet Take 1 tablet by mouth daily.   Yes [provider]  cetirizine (ZYRTEC) 10 MG tablet Take 1 tablet (10 mg total) by mouth daily. 02/07/17  Yes Boscia, Heather E, NP  finasteride (PROSCAR) 5 MG tablet TAKE 1 TABLET (5 MG TOTAL) BY MOUTH DAILY. 11/11/14  Yes [provider]  glimepiride (AMARYL) 4 MG tablet Take 1 tablet (4 mg total) by mouth 2 (two) times daily. 04/05/17  Yes Boscia, Heather E, NP  lisinopril (PRINIVIL,ZESTRIL) 2.5 MG tablet Take 2.5 mg by mouth daily. 03/14/17  Yes [provider]  metoprolol succinate (TOPROL-XL) 25 MG 24 hr tablet Take 25 mg by mouth daily.  12/22/14  Yes [provider]  Multiple Vitamin (MULTIVITAMIN) tablet Take 1 tablet by mouth daily.   Yes [provider]  omeprazole (PRILOSEC) 20 MG capsule Take 20 mg by mouth daily.  01/04/15  Yes [provider]  oseltamivir (TAMIFLU) 75 MG capsule Take 1 capsule (75 mg total) by mouth 2 (two) times daily for 5 days. 04/08/17 04/13/17  Lisa Roca, MD    No Known Allergies  Family History  Family history unknown: Yes    Social History Social History   Tobacco Use  . Smoking status: Never Smoker  . Smokeless tobacco: Never Used  Substance Use Topics  . Alcohol use: No  . Drug use: No    Review of Systems  Constitutional: Positive for fever. Eyes: Negative for visual changes. ENT: Negative for sore throat. Cardiovascular: Negative for chest pain. Respiratory: Positive for cough. Gastrointestinal: Negative for vomiting.. Genitourinary: Negative for hematuria. Musculoskeletal: Positive for bilateral flank pain.   Skin: Negative for rash. Neurological: Negative for headache.  ____________________________________________   PHYSICAL EXAM:  VITAL SIGNS: ED Triage Vitals  Enc Vitals Group     BP 04/08/17 1010 (!) 149/68     Pulse Rate 04/08/17 1010 (!) 116     Resp 04/08/17 1010 (!) 24     Temp 04/08/17 1010 99.5 F (37.5 C)     Temp Source 04/08/17 1010 Oral     SpO2 04/08/17 1010 96 %     Weight 04/08/17 1012 137 lb (62.1 kg)     Height 04/08/17 1012 5\' 9"  (1.753 m)     Head Circumference --      Peak Flow --      Pain Score 04/08/17 1011 10     Pain Loc --      Pain Edu? --      Excl. in [place]? --      Constitutional: Alert and operative. Well appearing and in no distress. HEENT   Head: Normocephalic and atraumatic.      Eyes: Conjunctivae are normal. Pupils equal and round.       Ears:         Nose: No congestion/rhinnorhea.   Mouth/Throat: Mucous membranes are mildly dry.   Neck: No stridor. Cardiovascular/Chest: Tachycardic rate, regular rhythm.  No murmurs, rubs, or gallops. Respiratory: Normal respiratory effort without tachypnea nor retractions. Breath sounds are clear and equal bilaterally.  Mild decreased air  movement throughout with mild rhonchi at the bases.  No wheezing or rales. Gastrointestinal: Soft. No distention, no guarding, no rebound.  Mild lower abdominal tenderness suprapubic area.  No focal McBurney's point tenderness. Genitourinary/rectal:Deferred Musculoskeletal: Nontender with normal range of motion in all extremities. No joint effusions.  No lower extremity tenderness.  No edema. Neurologic: No facial droop.  No gross or focal neurologic deficits are appreciated. Skin:  Skin is warm, dry and intact. No rash noted. Psychiatric: No agitation.   ____________________________________________  LABS (pertinent positives/negatives) I, Lisa Roca, MD the attending physician have reviewed the labs noted below.  Labs Reviewed  GLUCOSE, CAPILLARY - Abnormal; Notable for the following components:      Result Value   Glucose-Capillary 247 (*)    All other components within normal limits  URINALYSIS, COMPLETE (UACMP) WITH MICROSCOPIC - Abnormal; Notable  for the following components:   Color, Urine YELLOW (*)    APPearance HAZY (*)    Glucose, UA >=500 (*)    Hgb urine dipstick MODERATE (*)    Protein, ur 30 (*)    Squamous Epithelial / LPF 0-5 (*)    All other components within normal limits  CBC - Abnormal; Notable for the following components:   RBC 3.26 (*)    Hemoglobin 9.0 (*)    HCT 27.4 (*)    All other components within normal limits  COMPREHENSIVE METABOLIC PANEL - Abnormal; Notable for the following components:   Sodium 130 (*)    Chloride 100 (*)    CO2 20 (*)    Glucose, Bld 239 (*)    BUN 21 (*)    Calcium 8.6 (*)    Albumin 3.1 (*)    AST 44 (*)    GFR calc non Af Amer 53 (*)    All other components within normal limits  PROTIME-INR - Abnormal; Notable for the following components:   Prothrombin Time 15.9 (*)    All other components within normal limits  INFLUENZA PANEL BY PCR (TYPE A & B) - Abnormal; Notable for the following components:   Influenza A By  PCR POSITIVE (*)    All other components within normal limits  URINE CULTURE  CULTURE, BLOOD (ROUTINE X 2)  CULTURE, BLOOD (ROUTINE X 2)  LACTIC ACID, PLASMA  LACTIC ACID, PLASMA    ____________________________________________    EKG I, Lisa Roca, MD, the attending physician have personally viewed and interpreted all ECGs.  115 bpm.  Sinus tachycardia.  Narrow QS.  Normal axis.  Nonspecific ST and T waves with mild ST segment depression inferiorly. ____________________________________________  RADIOLOGY All Xrays were viewed by me.  Imaging interpreted by Radiologist, and I, Lisa Roca, MD the attending physician have reviewed the radiologist interpretation noted below.  Chest x-ray portable: No focal infiltrate  Radiologist report:  IMPRESSION: No active cardiopulmonary disease.  __________________________________________  PROCEDURES  Procedure(s) performed: None  Critical Care performed: CRITICAL CARE Performed by: Lisa Roca   Total critical care time: 30 minutes  Critical care time was exclusive of separately billable procedures and treating other patients.  Critical care was necessary to treat or prevent imminent or life-threatening deterioration.  Critical care was time spent personally by me on the following activities: development of treatment plan with patient and/or surrogate as well as nursing, discussions with consultants, evaluation of patient's response to treatment, examination of patient, obtaining history from patient or surrogate, ordering and performing treatments and interventions, ordering and review of laboratory studies, ordering and review of radiographic studies, pulse oximetry and re-evaluation of patient's condition.    ____________________________________________  ED COURSE / ASSESSMENT AND PLAN  Pertinent labs & imaging results that were available during my care of the patient were reviewed by me and considered in my medical  decision making (see chart for details).   Patient here with document of fever over 101 at home after being on antibiotics for several days for what was presumed to be but not documented to be urinary tract infection so far as I can tell from the daughter who seems very on top of his healthcare.  He has had a cough and respiratory symptoms although is not hypoxic he is tachycardic here with increased respiratory rate.  Chest x-ray is clear for no pneumonia.   Influenza A came back positive.  I suspect that his symptoms are probably all due  to influenza A.  Unclear whether or not onset of symptoms would have been Wednesday or Thursday versus today with his first fever, in any case I discussed treatment risk and benefit and chose to proceed with Tamiflu.  Patient heart rate down to 100, no hypotension.  He has been given IV fluids.  He had a slight hyponatremia but again was given fluid bolus here.  His respiratory rate is somewhere between 22 and mid 30s, although he is calm and comfortable and daughter who is very involved and competent with his care says that he is been breathing like that actually for a couple of years and it does not look any different than his baseline, she does not think is related to the flu or any recent illness.  We discussed at length whether not to observe in the hospital for the next 24 hours mostly just given his age, however given no hypoxia or hypotension, or ongoing tachycardia, or problems breathing, or altered mental status, daughter would like to take him home patient would like to go home, I think this is quite reasonable.    DIFFERENTIAL DIAGNOSIS: Including but not limited to pneumonia, influenza, sepsis, urinary retention, urinary tract infection/kidney infection, diverticulitis, etc.  CONSULTATIONS:   Hospitalist for admission   Patient / Family / Caregiver informed of clinical course, medical decision-making process, and agree with plan.   I  discussed return precautions, follow-up instructions, and discharge instructions with patient and/or family.  Discharge Instructions : You are evaluated for dizziness and poor intake, and body aches and found to be positive for influenza A.  I suspect your elevated blood sugars are due to the body fighting the flu right now.  Continue to try to drink plenty of fluids to avoid dehydration.  Return to the emergency department immediately for any worsening condition including trouble breathing, shortness of breath, dizziness or passing out, or any other symptoms concerning to you.  We discussed, you could try to get a pulse oximeter, available at the pharmacy to help track his oxygen level.  If oxygen level is below 90%, he definitely needs to be reevaluated immediately.     ___________________________________________   FINAL CLINICAL IMPRESSION(S) / ED DIAGNOSES   Final diagnoses:  Influenza A      ___________________________________________        Note: This dictation was prepared with Dragon dictation. Any transcriptional errors that result from this process are unintentional    Lisa Roca, MD 04/08/17 1341

## 2017-04-08 NOTE — Discharge Instructions (Signed)
You are evaluated for dizziness and poor intake, and body aches and found to be positive for influenza A.  I suspect your elevated blood sugars are due to the body fighting the flu right now.  Continue to try to drink plenty of fluids to avoid dehydration.  Return to the emergency department immediately for any worsening condition including trouble breathing, shortness of breath, dizziness or passing out, or any other symptoms concerning to you.  We discussed, you could try to get a pulse oximeter, available at the pharmacy to help track his oxygen level.  If oxygen level is below 90%, he definitely needs to be reevaluated immediately.

## 2017-04-08 NOTE — Progress Notes (Signed)
CODE SEPSIS - PHARMACY COMMUNICATION  **Broad Spectrum Antibiotics should be administered within 1 hour of Sepsis diagnosis**  Time Code Sepsis Called/Page Received: 1030  Antibiotics Ordered: 1107  Time of 1st antibiotic administration: 1117-Zosyn   Additional action taken by pharmacy: Contacted nurse   If necessary, Name of Provider/Nurse Contacted: Pharmacy called Denise at [phone] to administer abx   Candelaria Stagers ,PharmD Clinical Pharmacist  04/08/2017  11:19 AM

## 2017-04-08 NOTE — ED Triage Notes (Signed)
Pt in via POV, complaints of left side flank pain radiating around to bladder.  Pt reports urinary frequency and dysuria, difficulty starting stream.  Pt tacycardic, febrile.  Family also reports dizziness x 4-5 days, and acute onset of confusion this morning.  Pt A/Ox4, NAD noted at this time.

## 2017-04-09 LAB — URINE CULTURE: Culture: NO GROWTH

## 2017-04-10 LAB — CULTURE, URINE COMPREHENSIVE

## 2017-04-11 ENCOUNTER — Inpatient Hospital Stay
Admission: AD | Admit: 2017-04-11 | Discharge: 2017-04-16 | DRG: 654 | Disposition: A | Discharge status: Home Health | Payer: Medicare Other | Source: Ambulatory Visit | Attending: Internal Medicine | Admitting: Internal Medicine

## 2017-04-11 ENCOUNTER — Inpatient Hospital Stay: Payer: Medicare Other | Admitting: Anesthesiology

## 2017-04-11 ENCOUNTER — Other Ambulatory Visit: Payer: Self-pay

## 2017-04-11 ENCOUNTER — Encounter
Admission: AD | Disposition: A | Discharge status: Home Health | Payer: Self-pay | Source: Ambulatory Visit | Attending: Internal Medicine

## 2017-04-11 ENCOUNTER — Encounter: Payer: Self-pay | Admitting: Internal Medicine

## 2017-04-11 ENCOUNTER — Inpatient Hospital Stay: Payer: Medicare Other

## 2017-04-11 ENCOUNTER — Ambulatory Visit (INDEPENDENT_AMBULATORY_CARE_PROVIDER_SITE_OTHER): Payer: Medicare Other | Admitting: Internal Medicine

## 2017-04-11 VITALS — BP 162/84 | HR 96 | Resp 16 | Ht 66.0 in | Wt 133.6 lb

## 2017-04-11 DIAGNOSIS — R638 Other symptoms and signs concerning food and fluid intake: Secondary | ICD-10-CM

## 2017-04-11 DIAGNOSIS — R197 Diarrhea, unspecified: Secondary | ICD-10-CM | POA: Diagnosis not present

## 2017-04-11 DIAGNOSIS — R339 Retention of urine, unspecified: Secondary | ICD-10-CM | POA: Diagnosis present

## 2017-04-11 DIAGNOSIS — K219 Gastro-esophageal reflux disease without esophagitis: Secondary | ICD-10-CM | POA: Diagnosis present

## 2017-04-11 DIAGNOSIS — J101 Influenza due to other identified influenza virus with other respiratory manifestations: Secondary | ICD-10-CM | POA: Diagnosis present

## 2017-04-11 DIAGNOSIS — I10 Essential (primary) hypertension: Secondary | ICD-10-CM | POA: Diagnosis present

## 2017-04-11 DIAGNOSIS — M545 Low back pain, unspecified: Secondary | ICD-10-CM

## 2017-04-11 DIAGNOSIS — N400 Enlarged prostate without lower urinary tract symptoms: Secondary | ICD-10-CM | POA: Diagnosis not present

## 2017-04-11 DIAGNOSIS — N131 Hydronephrosis with ureteral stricture, not elsewhere classified: Secondary | ICD-10-CM | POA: Diagnosis present

## 2017-04-11 DIAGNOSIS — C801 Malignant (primary) neoplasm, unspecified: Secondary | ICD-10-CM | POA: Diagnosis not present

## 2017-04-11 DIAGNOSIS — J209 Acute bronchitis, unspecified: Secondary | ICD-10-CM | POA: Diagnosis not present

## 2017-04-11 DIAGNOSIS — I739 Peripheral vascular disease, unspecified: Secondary | ICD-10-CM | POA: Diagnosis present

## 2017-04-11 DIAGNOSIS — R300 Dysuria: Secondary | ICD-10-CM | POA: Diagnosis not present

## 2017-04-11 DIAGNOSIS — E785 Hyperlipidemia, unspecified: Secondary | ICD-10-CM | POA: Diagnosis present

## 2017-04-11 DIAGNOSIS — C7911 Secondary malignant neoplasm of bladder: Secondary | ICD-10-CM | POA: Diagnosis not present

## 2017-04-11 DIAGNOSIS — Z7982 Long term (current) use of aspirin: Secondary | ICD-10-CM

## 2017-04-11 DIAGNOSIS — Z6820 Body mass index (BMI) 20.0-20.9, adult: Secondary | ICD-10-CM

## 2017-04-11 DIAGNOSIS — R590 Localized enlarged lymph nodes: Secondary | ICD-10-CM | POA: Diagnosis not present

## 2017-04-11 DIAGNOSIS — C61 Malignant neoplasm of prostate: Secondary | ICD-10-CM

## 2017-04-11 DIAGNOSIS — R599 Enlarged lymph nodes, unspecified: Secondary | ICD-10-CM | POA: Diagnosis not present

## 2017-04-11 DIAGNOSIS — N3289 Other specified disorders of bladder: Secondary | ICD-10-CM | POA: Diagnosis present

## 2017-04-11 DIAGNOSIS — N320 Bladder-neck obstruction: Secondary | ICD-10-CM | POA: Diagnosis present

## 2017-04-11 DIAGNOSIS — Z880 Allergy status to penicillin: Secondary | ICD-10-CM

## 2017-04-11 DIAGNOSIS — E860 Dehydration: Secondary | ICD-10-CM | POA: Diagnosis not present

## 2017-04-11 DIAGNOSIS — R338 Other retention of urine: Secondary | ICD-10-CM | POA: Diagnosis not present

## 2017-04-11 DIAGNOSIS — R918 Other nonspecific abnormal finding of lung field: Secondary | ICD-10-CM | POA: Diagnosis not present

## 2017-04-11 DIAGNOSIS — N139 Obstructive and reflux uropathy, unspecified: Secondary | ICD-10-CM | POA: Diagnosis not present

## 2017-04-11 DIAGNOSIS — R627 Adult failure to thrive: Secondary | ICD-10-CM | POA: Diagnosis present

## 2017-04-11 DIAGNOSIS — D638 Anemia in other chronic diseases classified elsewhere: Secondary | ICD-10-CM | POA: Diagnosis present

## 2017-04-11 DIAGNOSIS — Z9079 Acquired absence of other genital organ(s): Secondary | ICD-10-CM

## 2017-04-11 DIAGNOSIS — N1330 Unspecified hydronephrosis: Secondary | ICD-10-CM | POA: Diagnosis not present

## 2017-04-11 DIAGNOSIS — Z79899 Other long term (current) drug therapy: Secondary | ICD-10-CM

## 2017-04-11 DIAGNOSIS — C786 Secondary malignant neoplasm of retroperitoneum and peritoneum: Secondary | ICD-10-CM | POA: Diagnosis present

## 2017-04-11 DIAGNOSIS — R630 Anorexia: Secondary | ICD-10-CM | POA: Diagnosis not present

## 2017-04-11 DIAGNOSIS — R109 Unspecified abdominal pain: Secondary | ICD-10-CM | POA: Diagnosis not present

## 2017-04-11 DIAGNOSIS — N401 Enlarged prostate with lower urinary tract symptoms: Secondary | ICD-10-CM | POA: Diagnosis present

## 2017-04-11 DIAGNOSIS — I6523 Occlusion and stenosis of bilateral carotid arteries: Secondary | ICD-10-CM | POA: Diagnosis present

## 2017-04-11 DIAGNOSIS — C799 Secondary malignant neoplasm of unspecified site: Secondary | ICD-10-CM | POA: Diagnosis not present

## 2017-04-11 DIAGNOSIS — R110 Nausea: Secondary | ICD-10-CM | POA: Diagnosis not present

## 2017-04-11 DIAGNOSIS — R531 Weakness: Secondary | ICD-10-CM | POA: Diagnosis not present

## 2017-04-11 DIAGNOSIS — N179 Acute kidney failure, unspecified: Secondary | ICD-10-CM | POA: Diagnosis present

## 2017-04-11 DIAGNOSIS — R39198 Other difficulties with micturition: Secondary | ICD-10-CM | POA: Diagnosis not present

## 2017-04-11 DIAGNOSIS — E1151 Type 2 diabetes mellitus with diabetic peripheral angiopathy without gangrene: Secondary | ICD-10-CM | POA: Diagnosis present

## 2017-04-11 DIAGNOSIS — R0602 Shortness of breath: Secondary | ICD-10-CM

## 2017-04-11 DIAGNOSIS — J111 Influenza due to unidentified influenza virus with other respiratory manifestations: Secondary | ICD-10-CM | POA: Diagnosis not present

## 2017-04-11 DIAGNOSIS — N132 Hydronephrosis with renal and ureteral calculous obstruction: Secondary | ICD-10-CM | POA: Diagnosis not present

## 2017-04-11 DIAGNOSIS — I2510 Atherosclerotic heart disease of native coronary artery without angina pectoris: Secondary | ICD-10-CM | POA: Diagnosis present

## 2017-04-11 DIAGNOSIS — Z7984 Long term (current) use of oral hypoglycemic drugs: Secondary | ICD-10-CM

## 2017-04-11 DIAGNOSIS — R636 Underweight: Secondary | ICD-10-CM | POA: Diagnosis present

## 2017-04-11 DIAGNOSIS — K5900 Constipation, unspecified: Secondary | ICD-10-CM | POA: Diagnosis not present

## 2017-04-11 DIAGNOSIS — C785 Secondary malignant neoplasm of large intestine and rectum: Secondary | ICD-10-CM | POA: Diagnosis not present

## 2017-04-11 DIAGNOSIS — R100 Acute abdomen: Secondary | ICD-10-CM | POA: Diagnosis not present

## 2017-04-11 DIAGNOSIS — G8918 Other acute postprocedural pain: Secondary | ICD-10-CM | POA: Diagnosis not present

## 2017-04-11 DIAGNOSIS — D649 Anemia, unspecified: Secondary | ICD-10-CM | POA: Diagnosis not present

## 2017-04-11 DIAGNOSIS — D509 Iron deficiency anemia, unspecified: Secondary | ICD-10-CM | POA: Diagnosis not present

## 2017-04-11 DIAGNOSIS — R634 Abnormal weight loss: Secondary | ICD-10-CM | POA: Diagnosis not present

## 2017-04-11 HISTORY — PX: CYSTOSCOPY WITH URETHRAL DILATATION: SHX5125

## 2017-04-11 HISTORY — PX: CYSTOSCOPY WITH STENT PLACEMENT: SHX5790

## 2017-04-11 HISTORY — PX: CYSTOSCOPY W/ RETROGRADES: SHX1426

## 2017-04-11 LAB — CBC
HCT: 29.9 % — ABNORMAL LOW (ref 40.0–52.0)
Hemoglobin: 9.7 g/dL — ABNORMAL LOW (ref 13.0–18.0)
MCH: 27.3 pg (ref 26.0–34.0)
MCHC: 32.5 g/dL (ref 32.0–36.0)
MCV: 84 fL (ref 80.0–100.0)
Platelets: 269 10*3/uL (ref 150–440)
RBC: 3.56 MIL/uL — ABNORMAL LOW (ref 4.40–5.90)
RDW: 14.5 % (ref 11.5–14.5)
WBC: 5.5 10*3/uL (ref 3.8–10.6)

## 2017-04-11 LAB — COMPREHENSIVE METABOLIC PANEL
ALT: 19 U/L (ref 17–63)
AST: 37 U/L (ref 15–41)
Albumin: 3.1 g/dL — ABNORMAL LOW (ref 3.5–5.0)
Alkaline Phosphatase: 107 U/L (ref 38–126)
Anion gap: 12 (ref 5–15)
BUN: 32 mg/dL — ABNORMAL HIGH (ref 6–20)
CO2: 22 mmol/L (ref 22–32)
Calcium: 8.4 mg/dL — ABNORMAL LOW (ref 8.9–10.3)
Chloride: 98 mmol/L — ABNORMAL LOW (ref 101–111)
Creatinine, Ser: 1.42 mg/dL — ABNORMAL HIGH (ref 0.61–1.24)
GFR calc Af Amer: 52 mL/min — ABNORMAL LOW (ref >60)
GFR calc non Af Amer: 45 mL/min — ABNORMAL LOW (ref >60)
Glucose, Bld: 289 mg/dL — ABNORMAL HIGH (ref 65–99)
Potassium: 3.9 mmol/L (ref 3.5–5.1)
Sodium: 132 mmol/L — ABNORMAL LOW (ref 135–145)
Total Bilirubin: 0.5 mg/dL (ref 0.3–1.2)
Total Protein: 7.7 g/dL (ref 6.5–8.1)

## 2017-04-11 LAB — GLUCOSE, CAPILLARY
Glucose-Capillary: 97 mg/dL (ref 65–99)
Glucose-Capillary: 94 mg/dL (ref 65–99)
Glucose-Capillary: 99 mg/dL (ref 65–99)

## 2017-04-11 LAB — APTT: aPTT: 33 seconds (ref 24–36)

## 2017-04-11 LAB — PROTIME-INR
INR: 1.12
Prothrombin Time: 14.3 seconds (ref 11.4–15.2)

## 2017-04-11 LAB — FERRITIN: Ferritin: 715 ng/mL — ABNORMAL HIGH (ref 24–336)

## 2017-04-11 SURGERY — CYSTOSCOPY, WITH STENT INSERTION
Anesthesia: General | Site: Ureter | Laterality: Left | Wound class: Clean Contaminated

## 2017-04-11 MED ORDER — MORPHINE SULFATE (PF) 2 MG/ML IV SOLN
2.0000 mg | Freq: Once | INTRAVENOUS | Status: AC
  Administered 2017-04-11: 2 mg via INTRAVENOUS

## 2017-04-11 MED ORDER — FENTANYL CITRATE (PF) 100 MCG/2ML IJ SOLN
INTRAMUSCULAR | Status: AC
  Filled 2017-04-11: qty 2

## 2017-04-11 MED ORDER — ONDANSETRON HCL 4 MG/2ML IJ SOLN: 4.0000 mg | Freq: Once | INTRAMUSCULAR | Status: DC | PRN

## 2017-04-11 MED ORDER — SUCCINYLCHOLINE CHLORIDE 20 MG/ML IJ SOLN
INTRAMUSCULAR | Status: AC
  Filled 2017-04-11: qty 1

## 2017-04-11 MED ORDER — FINASTERIDE 5 MG PO TABS
5.0000 mg | ORAL_TABLET | Freq: Every day | ORAL | Status: DC
  Administered 2017-04-12 – 2017-04-16 (×5): 5 mg via ORAL
  Filled 2017-04-11 (×5): qty 1

## 2017-04-11 MED ORDER — ACETAMINOPHEN 325 MG PO TABS
650.0000 mg | ORAL_TABLET | Freq: Four times a day (QID) | ORAL | Status: DC | PRN
  Administered 2017-04-14: 650 mg via ORAL
  Filled 2017-04-11: qty 2

## 2017-04-11 MED ORDER — ADULT MULTIVITAMIN W/MINERALS CH
1.0000 | ORAL_TABLET | Freq: Every day | ORAL | Status: DC
  Administered 2017-04-12 – 2017-04-16 (×5): 1 via ORAL
  Filled 2017-04-11 (×5): qty 1

## 2017-04-11 MED ORDER — SODIUM CHLORIDE 0.9 % IV SOLN
INTRAVENOUS | Status: DC
  Administered 2017-04-11 – 2017-04-16 (×9): via INTRAVENOUS

## 2017-04-11 MED ORDER — PANTOPRAZOLE SODIUM 40 MG PO TBEC
40.0000 mg | DELAYED_RELEASE_TABLET | Freq: Every day | ORAL | Status: DC
  Administered 2017-04-12 – 2017-04-16 (×5): 40 mg via ORAL
  Filled 2017-04-11 (×5): qty 1

## 2017-04-11 MED ORDER — PROPOFOL 10 MG/ML IV BOLUS
INTRAVENOUS | Status: AC
Start: 2017-04-11 — End: ?
  Filled 2017-04-11: qty 20

## 2017-04-11 MED ORDER — CEFAZOLIN SODIUM 1 G IJ SOLR
INTRAMUSCULAR | Status: AC
  Filled 2017-04-11: qty 10

## 2017-04-11 MED ORDER — OXYCODONE HCL 5 MG PO TABS: 5.0000 mg | ORAL_TABLET | ORAL | Status: DC | PRN

## 2017-04-11 MED ORDER — INSULIN ASPART 100 UNIT/ML ~~LOC~~ SOLN
0.0000 [IU] | Freq: Every day | SUBCUTANEOUS | Status: DC
  Administered 2017-04-13: 3 [IU] via SUBCUTANEOUS
  Administered 2017-04-14: 2 [IU] via SUBCUTANEOUS
  Administered 2017-04-15: 3 [IU] via SUBCUTANEOUS
  Filled 2017-04-11 (×3): qty 1

## 2017-04-11 MED ORDER — ONDANSETRON HCL 4 MG PO TABS: 4.0000 mg | ORAL_TABLET | Freq: Four times a day (QID) | ORAL | Status: DC | PRN

## 2017-04-11 MED ORDER — MORPHINE SULFATE (PF) 2 MG/ML IV SOLN
2.0000 mg | INTRAVENOUS | Status: DC | PRN
  Administered 2017-04-13 (×2): 2 mg via INTRAVENOUS
  Filled 2017-04-11 (×3): qty 1

## 2017-04-11 MED ORDER — ATORVASTATIN CALCIUM 20 MG PO TABS
10.0000 mg | ORAL_TABLET | Freq: Every day | ORAL | Status: DC
  Administered 2017-04-12 – 2017-04-16 (×5): 10 mg via ORAL
  Filled 2017-04-11 (×5): qty 1

## 2017-04-11 MED ORDER — CEFAZOLIN SODIUM-DEXTROSE 2-3 GM-%(50ML) IV SOLR
INTRAVENOUS | Status: DC | PRN
  Administered 2017-04-11: 1 g via INTRAVENOUS

## 2017-04-11 MED ORDER — ONDANSETRON HCL 4 MG/2ML IJ SOLN
INTRAMUSCULAR | Status: AC
  Filled 2017-04-11: qty 2

## 2017-04-11 MED ORDER — ONDANSETRON HCL 4 MG/2ML IJ SOLN
INTRAMUSCULAR | Status: DC | PRN
  Administered 2017-04-11: 4 mg via INTRAVENOUS

## 2017-04-11 MED ORDER — FENTANYL CITRATE (PF) 100 MCG/2ML IJ SOLN: 25.0000 ug | INTRAMUSCULAR | Status: DC | PRN

## 2017-04-11 MED ORDER — IOTHALAMATE MEGLUMINE 43 % IV SOLN
INTRAVENOUS | Status: DC | PRN
  Administered 2017-04-11: 12 mL via SURGICAL_CAVITY

## 2017-04-11 MED ORDER — ACETAMINOPHEN 650 MG RE SUPP: 650.0000 mg | Freq: Four times a day (QID) | RECTAL | Status: DC | PRN

## 2017-04-11 MED ORDER — PROPOFOL 10 MG/ML IV BOLUS
INTRAVENOUS | Status: DC | PRN
  Administered 2017-04-11: 80 mg via INTRAVENOUS

## 2017-04-11 MED ORDER — ONDANSETRON HCL 4 MG/2ML IJ SOLN: 4.0000 mg | Freq: Four times a day (QID) | INTRAMUSCULAR | Status: DC | PRN

## 2017-04-11 MED ORDER — METOPROLOL SUCCINATE ER 25 MG PO TB24
25.0000 mg | ORAL_TABLET | Freq: Every day | ORAL | Status: DC
  Administered 2017-04-12 – 2017-04-16 (×5): 25 mg via ORAL
  Filled 2017-04-11 (×5): qty 1

## 2017-04-11 MED ORDER — LIDOCAINE HCL (CARDIAC) 20 MG/ML IV SOLN
INTRAVENOUS | Status: DC | PRN
  Administered 2017-04-11: 40 mg via INTRAVENOUS

## 2017-04-11 MED ORDER — ENOXAPARIN SODIUM 40 MG/0.4ML ~~LOC~~ SOLN: 40.0000 mg | SUBCUTANEOUS | Status: DC

## 2017-04-11 MED ORDER — PHENYLEPHRINE HCL 10 MG/ML IJ SOLN
INTRAMUSCULAR | Status: AC
  Filled 2017-04-11: qty 1

## 2017-04-11 MED ORDER — ASPIRIN EC 325 MG PO TBEC: 325.0000 mg | DELAYED_RELEASE_TABLET | Freq: Every day | ORAL | Status: DC

## 2017-04-11 MED ORDER — IOPAMIDOL (ISOVUE-300) INJECTION 61%
75.0000 mL | Freq: Once | INTRAVENOUS | Status: AC | PRN
  Administered 2017-04-11: 75 mL via INTRAVENOUS

## 2017-04-11 MED ORDER — OSELTAMIVIR PHOSPHATE 75 MG PO CAPS
75.0000 mg | ORAL_CAPSULE | Freq: Two times a day (BID) | ORAL | Status: DC
  Administered 2017-04-11 – 2017-04-12 (×3): 75 mg via ORAL
  Filled 2017-04-11 (×6): qty 1

## 2017-04-11 MED ORDER — INSULIN ASPART 100 UNIT/ML ~~LOC~~ SOLN
0.0000 [IU] | Freq: Three times a day (TID) | SUBCUTANEOUS | Status: DC
  Administered 2017-04-11: 5 [IU] via SUBCUTANEOUS
  Administered 2017-04-12 (×2): 3 [IU] via SUBCUTANEOUS
  Administered 2017-04-12: 2 [IU] via SUBCUTANEOUS
  Administered 2017-04-13: 1 [IU] via SUBCUTANEOUS
  Administered 2017-04-13 – 2017-04-14 (×3): 5 [IU] via SUBCUTANEOUS
  Administered 2017-04-14: 2 [IU] via SUBCUTANEOUS
  Administered 2017-04-15 (×2): 5 [IU] via SUBCUTANEOUS
  Administered 2017-04-15: 3 [IU] via SUBCUTANEOUS
  Administered 2017-04-16: 2 [IU] via SUBCUTANEOUS
  Filled 2017-04-11 (×13): qty 1

## 2017-04-11 MED ORDER — LIDOCAINE HCL (PF) 2 % IJ SOLN
INTRAMUSCULAR | Status: AC
  Filled 2017-04-11: qty 10

## 2017-04-11 MED ORDER — FENTANYL CITRATE (PF) 100 MCG/2ML IJ SOLN
INTRAMUSCULAR | Status: DC | PRN
  Administered 2017-04-11 (×4): 25 ug via INTRAVENOUS

## 2017-04-11 MED ORDER — IOPAMIDOL (ISOVUE-300) INJECTION 61%
15.0000 mL | INTRAVENOUS | Status: AC
  Administered 2017-04-11 (×2): 15 mL via ORAL

## 2017-04-11 MED ORDER — LORATADINE 10 MG PO TABS
10.0000 mg | ORAL_TABLET | Freq: Every day | ORAL | Status: DC
  Administered 2017-04-12 – 2017-04-16 (×5): 10 mg via ORAL
  Filled 2017-04-11 (×6): qty 1

## 2017-04-11 SURGICAL SUPPLY — 31 items
BAG DRAIN CYSTO-URO LG1000N (MISCELLANEOUS) ×3 IMPLANT
BRUSH SCRUB EZ  4% CHG (MISCELLANEOUS) ×3
BRUSH SCRUB EZ 4% CHG (MISCELLANEOUS) ×2 IMPLANT
CATH FOL 2WAY LX 16X5 (CATHETERS) ×3 IMPLANT
CATH URETL 5X70 OPEN END (CATHETERS) ×3 IMPLANT
CONRAY 43 FOR UROLOGY 50M (MISCELLANEOUS) ×3 IMPLANT
DRAPE UTILITY 15X26 TOWEL STRL (DRAPES) ×3 IMPLANT
ELECT REM PT RETURN 9FT ADLT (ELECTROSURGICAL)
ELECTRODE REM PT RTRN 9FT ADLT (ELECTROSURGICAL) IMPLANT
GLOVE BIO SURGEON STRL SZ 6.5 (GLOVE) ×9 IMPLANT
GOWN STRL REUS W/ TWL LRG LVL3 (GOWN DISPOSABLE) ×6 IMPLANT
GOWN STRL REUS W/TWL LRG LVL3 (GOWN DISPOSABLE) ×9
GUIDEWIRE ANGLED .035 180CM (WIRE) ×3 IMPLANT
GUIDEWIRE GREEN .038 145CM (MISCELLANEOUS) ×3 IMPLANT
KIT TURNOVER CYSTO (KITS) ×3 IMPLANT
PACK CYSTO AR (MISCELLANEOUS) ×3 IMPLANT
SENSORWIRE 0.038 NOT ANGLED (WIRE) ×3
SET CYSTO W/LG BORE CLAMP LF (SET/KITS/TRAYS/PACK) ×3 IMPLANT
SET DILATOR URETRAL 8.5FR (MISCELLANEOUS) ×3 IMPLANT
SOL .9 NS 3000ML IRR  AL (IV SOLUTION) ×3
SOL .9 NS 3000ML IRR UROMATIC (IV SOLUTION) ×2 IMPLANT
STENT URET 6FRX24 CONTOUR (STENTS) IMPLANT
STENT URET 6FRX26 CONTOUR (STENTS) IMPLANT
STENT URO INLAY 6FRX24CM (STENTS) ×3 IMPLANT
SURGILUBE 2OZ TUBE FLIPTOP (MISCELLANEOUS) ×3 IMPLANT
SYR 10ML LL (SYRINGE) ×3 IMPLANT
SYR 30ML LL (SYRINGE) ×3 IMPLANT
SYRINGE IRR TOOMEY STRL 70CC (SYRINGE) ×3 IMPLANT
WATER STERILE IRR 1000ML POUR (IV SOLUTION) ×3 IMPLANT
WATER STERILE IRR 3000ML UROMA (IV SOLUTION) ×3 IMPLANT
WIRE SENSOR 0.038 NOT ANGLED (WIRE) ×2 IMPLANT

## 2017-04-11 NOTE — Op Note (Signed)
Date of procedure: 04/11/17  Preoperative diagnosis:  1. Urinary retention 2. Difficult Foley 3. Bilateral hydronephrosis 4. Pelvic mass with metastasis  Postoperative diagnosis:  1. Same as above 2. Bladder neck contracture  Procedure: 1. Cystoscopy 2. Cystogram 3. Left retrograde pyelogram 4. Left ureteral stent placement 5. Dilation of bladder contracture 6. Bladder neck biopsy 7. Exam under anesthesia  Surgeon: [name], MD  Anesthesia: General  Complications: None  Intraoperative findings: Irregular prostatic fossa with pinpoint bladder neck contracture.  No significant intraluminal tumor other than necrotic nodular, submucosal appearing lesion at the right bladder neck distorting the right hemitrigone.  UO unable to be identified on the side.  Left ureteral narrowing at the length of the iliacs with proximal hydroureteronephrosis.     EBL: minimal  Specimens: Bladder neck tumor  Drains: 18 French Council tip catheter, 6 x 24 French double-J ureteral stent on left  Indication: Michael [name] is a 84 y.o. patient with pelvic mass and metastatic disease of unknown primary, urinary retention, bilateral hydronephrosis with inability to place a catheter at the bedside.  After reviewing the management options for treatment, he elected to proceed with the above surgical procedure(s). We have discussed the potential benefits and risks of the procedure, side effects of the proposed treatment, the likelihood of the patient achieving the goals of the procedure, and any potential problems that might occur during the procedure or recuperation. Informed consent has been obtained.  Description of procedure:  The patient was taken to the operating room and general anesthesia was induced.  The patient was placed in the dorsal lithotomy position, prepped and draped in the usual sterile fashion, and preoperative antibiotics were administered. A preoperative time-out was performed.    At this point time, 53 [place] scope was advanced per urethra into an irregular prostatic fossa.  There appeared to be significant mass-effect primarily on the right with some nodularity.  At the bladder neck, the pin point opening was appreciated which is only able to accommodate a 5 [place] open-ended ureteral catheter.  I then injected Conray through this to perform a cystogram to ensure that this indeed opening into the bladder.  The bladder was noted to be markedly distended with an irregular contour at the right bladder neck, otherwise was smooth and markedly distended.  There is no reflux appreciated.  A wire was then placed under direct vision at this a Super Stiff) into the bladder.  Serial dilation starting with an 8 French Cook urethral dilator was then performed under fluoroscopy up to 22 [place].  The wire was snapped in place as a safety wire into the bladder.  At this point time, I was able to advance a 21 French rigid cystoscope with some difficulty into the bladder.  Attention was then first turned to the trigone.  Had a very irregular contour on the right with some submucosal nodularity and necrotic appearing tissue.  The left hemitrigone however was normal and the left UO was able to be identified without much difficulty.  Retrograde pyelogram performed at the side revealed a fairly long, several centimeters length stricture near the level of the iliacs most consistent with extrinsic compression.  There is hydroureteronephrosis down to this level.  With some difficulty, I was able to eventually get a sensor wire up to the level of the kidney.  A digital retrograde was performed within the collecting system as it did not fill adequately at the time of the initial retrograde due to the presence of the  stricture.  There were no filling defects appreciated in the upper tract.  The wire was then replaced and a 6 x 24 French Bard Optima ureteral stent was advanced over the wire up to level the renal  pelvis.  The wires partially drawn until focal is noted within the renal pelvis.  The wire was then fully withdrawn a full coils noted within the bladder.  At this point time, the bladder was partially drained.  At this point in time, the remainder of the bladder was able to be inspected and there was no obvious mucosal tumor appreciated within the bladder and did not seem to correlate with what was suggested on CT scan.  I then turned my attention back to the right hemitrigone.  After careful and lengthy inspection, I was unable to identify the UO on the side.  Using cold cup biopsy forceps, I took several biopsies of the abnormal tissue at this level.   These were passed off the field is right bladder neck tumor.  Bugbee electrocautery was then used to fulgurate all bleeding areas.  Finally, the bladder was completely drained and the scope was removed.  An 15 [place] Council tip catheter was placed over the previously placed Super Stiff wire and hubbed.  The wire was then removed and there was drainage of urine.  The balloon was inflated with 10 cc of sterile water.  The urine remained pink but hemostasis was adequate.  A bimanual exam including a digital rectal exam was then performed.  The prostate was rockhard and somewhat nodular especially towards the base.  Beyond the prostate, a large palpable mass was noted anteriorly and superior to the prostate.  Plan: We will touch base with medical oncology and pathology tomorrow.  Depending on the results of this biopsy, he may not need a retroperitoneal biopsy at this point in time.  Unable to place right ureteral stent, thus will monitor creatinine and repeat renal ultrasound likely Friday to assess for residual hydronephrosis.  He may require a right percutaneous nephrostomy tube in the future.  [name], M.D.

## 2017-04-11 NOTE — Evaluation (Signed)
Physical Therapy Evaluation Patient Details Name: Michael [name] MRN: [id number] DOB: 1933/12/27 Today's Date: 04/11/2017   History of Present Illness  presented to ER secondary to severe, worsening abdominal pain; admitted for work-up of abdominal pain, AKI secondary to dehydration.  Clinical Impression  Upon evaluation, patient alert and oriented; follows commands and demonstrates good effort with all functional tasks.  Additional distance/activity limited by abdominal pain, generalized fatigue.  Bilat UE/LE strength and ROM grossly symmetrical and WFL; no focal weakness noted.  Able to complete bed mobility with mod indep; sit/stand, basic transfers and gait (10') without assist device, min assist.  Decreased stability with limited balance reactions, consistently reaching for furniture to stabilize as needed.  Improved to cga with use of RW, subjectively reporting improved comfort/confidence with use of RW. Would benefit from skilled PT to address above deficits and promote optimal return to PLOF; Recommend transition to [facility] upon discharge from acute hospitalization.     Follow Up Recommendations Home health PT    Equipment Recommendations  Rolling walker with 5" wheels    Recommendations for Other Services       Precautions / Restrictions Precautions Precautions: Fall Restrictions Weight Bearing Restrictions: No      Mobility  Bed Mobility Overal bed mobility: Modified Independent                Transfers Overall transfer level: Needs assistance Equipment used: Rolling walker (2 wheeled) Transfers: Sit to/from Stand           General transfer comment: cuing for hand placement to prevent pulling on RW  Ambulation/Gait Ambulation/Gait assistance: Min assist Ambulation Distance (Feet): 10 Feet(forward/backward)         General Gait Details: inconsistent step height/length, constantly reaching for furniture for external stabilization; declined additional  distance due to fatigue/abdominal pain  Stairs            Wheelchair Mobility    Modified Rankin (Stroke Patients Only)       Balance Overall balance assessment: Needs assistance Sitting-balance support: No upper extremity supported;Feet supported Sitting balance-Leahy Scale: Good     Standing balance support: No upper extremity supported Standing balance-Leahy Scale: Poor                               Pertinent Vitals/Pain Pain Assessment: Faces Faces Pain Scale: Hurts even more Pain Location: abdomen Pain Descriptors / Indicators: Aching Pain Intervention(s): Limited activity within patient's tolerance;Monitored during session;Repositioned    Home Living Family/patient expects to be discharged to:: Private residence Living Arrangements: Children Available Help at Discharge: Family(daughter works outside of home during the day) Type of Home: House Home Access: Stairs to enter Entrance Stairs-Rails: Psychiatric nurse of Steps: 3 Home Layout: Two level;Bed/bath upstairs Home Equipment: None      Prior Function Level of Independence: Independent         Comments: Indep with ADLs, household and community mobilization; walking 2-3 miles/day prior to last 1-2 months     Hand Dominance        Extremity/Trunk Assessment   Upper Extremity Assessment Upper Extremity Assessment: Overall WFL for tasks assessed    Lower Extremity Assessment Lower Extremity Assessment: Overall WFL for tasks assessed(grossly at least 4+/5)       Communication   Communication: Interpreter utilized(Daughter preferred to interpret, but language line available throughout session to convey/confirm information needed (interpreter ID# [phone]))  Cognition Arousal/Alertness: Awake/alert Behavior During Therapy: [facility]  for tasks assessed/performed Overall Cognitive Status: Within Functional Limits for tasks assessed                                         General Comments      Exercises Other Exercises Other Exercises: 10' forward/backward with RW, cga-improved mechanics, symmetry and overall safety/stability with use of RW   Assessment/Plan    PT Assessment Patient needs continued PT services  PT Problem List Decreased strength;Decreased range of motion;Decreased activity tolerance;Decreased balance;Decreased mobility;Decreased cognition;Decreased knowledge of use of DME;Decreased safety awareness;Cardiopulmonary status limiting activity       PT Treatment Interventions DME instruction;Gait training;Stair training;Functional mobility training;Therapeutic activities;Therapeutic exercise;Patient/family education    PT Goals (Current goals can be found in the Care Plan section)  Acute Rehab PT Goals Patient Stated Goal: to get stronger and return home PT Goal Formulation: With patient/family Time For Goal Achievement: 04/25/17 Potential to Achieve Goals: Good    Frequency Min 2X/week   Barriers to discharge Decreased caregiver support      Co-evaluation               AM-PAC PT "6 Clicks" Daily Activity  Outcome Measure Difficulty turning over in bed (including adjusting bedclothes, sheets and blankets)?: A Little Difficulty moving from lying on back to sitting on the side of the bed? : A Little Difficulty sitting down on and standing up from a chair with arms (e.g., wheelchair, bedside commode, etc,.)?: Unable Help needed moving to and from a bed to chair (including a wheelchair)?: A Little Help needed walking in hospital room?: A Little Help needed climbing 3-5 steps with a railing? : A Little 6 Click Score: 16    End of Session Equipment Utilized During Treatment: Gait belt Activity Tolerance: Patient limited by pain Patient left: in bed;with bed alarm set;with call bell/phone within reach;with family/visitor present Nurse Communication: Mobility status PT Visit Diagnosis: Difficulty in walking, not  elsewhere classified (R26.2);Muscle weakness (generalized) (M62.81)    Time: 2542-7062 PT Time Calculation (min) (ACUTE ONLY): 28 min   Charges:   PT Evaluation $PT Eval Low Complexity: 1 Low PT Treatments $Therapeutic Activity: 8-22 mins   PT G Codes:        Elverna Caffee H. Owens Shark, PT, DPT, NCS 04/11/17, 10:26 PM [phone]

## 2017-04-11 NOTE — Anesthesia Preprocedure Evaluation (Signed)
Anesthesia Evaluation  Patient identified by MRN, date of birth, ID band Patient awake    Reviewed: Allergy & Precautions, H&P , NPO status , Patient's Chart, lab work & pertinent test results, reviewed documented beta blocker date and time   Airway Mallampati: II  TM Distance: >3 FB Neck ROM: full    Dental  (+) Teeth Intact, Upper Dentures, Lower Dentures   Pulmonary neg pulmonary ROS,    Pulmonary exam normal        Cardiovascular Exercise Tolerance: Good hypertension, On Medications + angina with exertion + CAD and + Peripheral Vascular Disease  negative cardio ROS Normal cardiovascular exam Rate:Normal     Neuro/Psych negative neurological ROS  negative psych ROS   GI/Hepatic negative GI ROS, Neg liver ROS, GERD  Medicated,  Endo/Other  negative endocrine ROSdiabetes  Renal/GU negative Renal ROS  negative genitourinary   Musculoskeletal   Abdominal   Peds  Hematology negative hematology ROS (+)   Anesthesia Other Findings   Reproductive/Obstetrics negative OB ROS                             Anesthesia Physical Anesthesia Plan  ASA: II and emergent  Anesthesia Plan: General LMA   Post-op Pain Management:    Induction:   PONV Risk Score and Plan:   Airway Management Planned:   Additional Equipment:   Intra-op Plan:   Post-operative Plan:   Informed Consent: I have reviewed the patients History and Physical, chart, labs and discussed the procedure including the risks, benefits and alternatives for the proposed anesthesia with the patient or authorized representative who has indicated his/her understanding and acceptance.     Plan Discussed with: CRNA  Anesthesia Plan Comments:         Anesthesia Quick Evaluation

## 2017-04-11 NOTE — Brief Op Note (Signed)
04/11/2017  9:29 PM  PATIENT:  Michael [name]  84 y.o. male  PRE-OPERATIVE DIAGNOSIS:  urinary retention  POST-OPERATIVE DIAGNOSIS:  urinary retention  PROCEDURE:  Procedure(s): CYSTOSCOPY WITH STENT PLACEMENT and fulgeration (Left) CYSTOSCOPY WITH RETROGRADE PYELOGRAM (Bilateral) CYSTOSCOPY WITH URETHRAL DILATATION (Bilateral) Bladder biopsy  SURGEON:  Surgeon(s) and Role:    [name], MD - Primary  ASSISTANTS: none   ANESTHESIA:   general  EBL:  Total I/O In: 500 [I.V.:500] Out: 150 [Urine:150]  Drains: 18 Fr Foley, 6 x 24 Fr Bard optima stent on left  Specimen: bladder tumor   COUNTS CORRECT: YES  PLAN OF CARE: return to floor  PATIENT DISPOSITION:  PACU - hemodynamically stable.

## 2017-04-11 NOTE — Progress Notes (Signed)
[facility] [place], [place] [address]  Internal MEDICINE  Office Visit Note  Patient Name: Michael [name]  [id number]  [id number]  Date of Service: 04/11/2017  Chief Complaint  Patient presents with  . Hospitalization Follow-up    was dx with flu on Sunday    HPI  Pt is here for routine follow up. He was seen last week and was trested for UTI. He continued to decline and went to ED on Sunday, was diagnosed with flu with multiple abnormal labs. Daughter refused admission at the time. Today he is here with his daughter with decreased to little po intake. Worsening lower back pain, diarrhea and difficulty urinating.  Pt has just come from [place] after a visit   Current Medication: Outpatient Encounter Medications as of 04/11/2017  Medication Sig  . aspirin (GOODSENSE ASPIRIN) 325 MG tablet Take by mouth.  [name] atorvastatin (LIPITOR) 10 MG tablet Take 10 mg by mouth daily.  . Calcium-Vitamin D 600-200 MG-UNIT tablet Take by mouth.  . cetirizine (ZYRTEC) 10 MG tablet Take 1 tablet (10 mg total) by mouth daily.  . finasteride (PROSCAR) 5 MG tablet TAKE 1 TABLET (5 MG TOTAL) BY MOUTH DAILY.  [name] glimepiride (AMARYL) 4 MG tablet Take 1 tablet (4 mg total) by mouth 2 (two) times daily.  [name] lisinopril (PRINIVIL,ZESTRIL) 2.5 MG tablet Take 2.5 mg by mouth daily.  . metoprolol succinate (TOPROL-XL) 25 MG 24 hr tablet Take 25 mg by mouth daily.   . Multiple Vitamin (MULTIVITAMIN) tablet Take 1 tablet by mouth daily.  [name] omeprazole (PRILOSEC) 20 MG capsule Take 20 mg by mouth daily.  [name] oseltamivir (TAMIFLU) 75 MG capsule Take 1 capsule (75 mg total) by mouth 2 (two) times daily for 5 days.  [name] amoxicillin-clavulanate (AUGMENTIN) 875-125 MG tablet Take 1 tablet by mouth 2 (two) times daily. (Patient not taking: Reported on 04/11/2017)  . calcium gluconate 500 MG tablet Take 1 tablet by mouth daily.  . Multiple Vitamins-Minerals (MULTIVITAMIN WITH MINERALS) tablet Take by  mouth.   No facility-administered encounter medications on file as of 04/11/2017.     Surgical History: Past Surgical History:  Procedure Laterality Date  . CARDIAC CATHETERIZATION  2010  . CARDIAC CATHETERIZATION N/A 02/03/2015   Procedure: Left Heart Cath and Coronary Angiography;  Surgeon: Wellington [name], MD;  Location: [facility];  Service: Cardiovascular;  Laterality: N/A;  . CATARACT EXTRACTION    . PROSTATECTOMY      Medical History: Past Medical History:  Diagnosis Date  . Benign prostatic hypertrophy   . Bilateral carotid artery disease (HCC)    Mild plaque formation without obstructive disease noted on carotid Doppler.  . Coronary artery disease    Previous cardiac catheterization at [facility] in 2010. The patient was told about 2 blockages which did not require revascularization.  . Diabetes mellitus without complication ([place])   . Essential hypertension   . GERD (gastroesophageal reflux disease)   . Hyperlipidemia   . Hypertension   . Left carotid artery stenosis     Family History: Family History  Family history unknown: Yes    Social History   Socioeconomic History  . Marital status: Widowed    Spouse name: Not on file  . Number of children: Not on file  . Years of education: Not on file  . Highest education level: Not on file  Social Needs  . Financial resource strain: Not on file  . Food insecurity - worry: Not  on file  . Food insecurity - inability: Not on file  . Transportation needs - medical: Not on file  . Transportation needs - non-medical: Not on file  Occupational History  . Not on file  Tobacco Use  . Smoking status: Never Smoker  . Smokeless tobacco: Never Used  Substance and Sexual Activity  . Alcohol use: No  . Drug use: No  . Sexual activity: Not on file  Other Topics Concern  . Not on file  Social History Narrative  . Not on file      Review of Systems  Constitutional: Positive for fatigue. Negative for chills and  unexpected weight change.  HENT: Negative for congestion, rhinorrhea, sneezing and sore throat.   Eyes: Positive for redness.  Respiratory: Negative for cough, chest tightness and shortness of breath.   Cardiovascular: Negative for chest pain and palpitations.  Gastrointestinal: Positive for abdominal pain, diarrhea and nausea. Negative for constipation and vomiting.  Genitourinary: Positive for difficulty urinating and frequency. Negative for dysuria.  Musculoskeletal: Negative for arthralgias, back pain, joint swelling and neck pain.  Skin: Positive for color change (decreased po intake ). Negative for rash.  Neurological: Negative.  Negative for tremors and numbness.  Hematological: Negative for adenopathy. Does not bruise/bleed easily.  Psychiatric/Behavioral: Negative for behavioral problems (Depression), sleep disturbance and suicidal ideas. The patient is not nervous/anxious.     Vital Signs: BP (!) 162/84 (BP Location: Left Arm, Patient Position: Sitting)   Pulse 96   Resp 16   Ht 5\' 6"  (1.676 m)   Wt 133 lb 9.6 oz (60.6 kg)   SpO2 98%   BMI 21.56 kg/m    Physical Exam  Constitutional: He is oriented to person, place, and time. He appears well-developed and well-nourished. No distress.  HENT:  Head: Normocephalic and atraumatic.  Mouth/Throat: Oropharynx is clear and moist. No oropharyngeal exudate.  Eyes: EOM are normal. Pupils are equal, round, and reactive to light.  Neck: Normal range of motion. Neck supple. No JVD present. No tracheal deviation present. No thyromegaly present.  Cardiovascular: Normal rate, regular rhythm and normal heart sounds. Exam reveals no gallop and no friction rub.  No murmur heard. Pulmonary/Chest: Effort normal. No respiratory distress. He has no wheezes. He has no rales. He exhibits no tenderness.  Abdominal: Soft. Bowel sounds are normal.  Musculoskeletal: Normal range of motion.  Lymphadenopathy:    He has no cervical adenopathy.   Neurological: He is alert and oriented to person, place, and time. No cranial nerve deficit.  Skin: Skin is warm and dry. He is not diaphoretic.  pale  Psychiatric: He has a normal mood and affect. His behavior is normal. Judgment and thought content normal.   Assessment/Plan: 1. Weakness generalized Can be related to decreased po intake. Anemia and low sodium on labs  2. Decreased oral intake Will admit to [facility]   3. Acute bilateral low back pain without sciatica Unknown etiology, might need CT scan  4. Difficulty in urination Check psa level and see urology  5. Diarrhea, unspecified type Can be viral  6. Influenza. On Tamiflu   General Counseling: Gordhanbha verbalizes understanding of the findings of todays visit and agrees with plan of treatment. I have discussed any further diagnostic evaluation that may be needed or ordered today. We also reviewed his medications today. he has been encouraged to call the office with any questions or concerns that should arise related to todays visit. Pt is being admitted to [facility] for iv hydration  and further work up of back pain, diarrhea. Spoke with Dr Earleen Newport    Time spent: 97  Minutes          Dr [name] Internal medicine

## 2017-04-11 NOTE — Transfer of Care (Signed)
Immediate Anesthesia Transfer of Care Note  Patient: Michael [name]  Procedure(s) Performed: CYSTOSCOPY WITH STENT PLACEMENT and fulgeration (Bilateral Ureter) CYSTOSCOPY WITH RETROGRADE PYELOGRAM (Bilateral Ureter) CYSTOSCOPY WITH URETHRAL DILATATION (Bilateral Ureter)  Patient Location: PACU  Anesthesia Type:General  Level of Consciousness: awake, alert  and patient cooperative  Airway & Oxygen Therapy: Patient Spontanous Breathing and Patient connected to face mask oxygen  Post-op Assessment: Report given to RN and Post -op Vital signs reviewed and stable  Post vital signs: Reviewed and stable  Last Vitals:  Vitals:   04/11/17 1035 04/11/17 1305  BP: (!) 163/84 (!) 160/78  Pulse: 86 90  Resp: 18 18  Temp: 36.6 C 37 C  SpO2: 100% 100%    Last Pain:  Vitals:   04/11/17 1305  TempSrc: Oral  PainSc:          Complications: No apparent anesthesia complications

## 2017-04-11 NOTE — Consult Note (Signed)
Urology Consult  I have been asked to see the patient by Dr. Earleen Newport, for evaluation and management of metastatic cancer.  Chief Complaint: abdominal pain  History of Present Illness: Michael [name] is a 84 y.o. year old presenting today to the hospital as a direct admit for abdominal pain, loose stool, difficulty voiding and recent diagnosis of influenza A.  As part of his further evaluation, he underwent CT abdomen pelvis with contrast which shows evidence of diffuse metastatic disease involving the bladder, rectum, seminal vesicle, and prostate.  There is also extensive nodal metastasis in the retroperitoneum, abdomen, and retrocrural space.  His bladder is also markedly distended on the CT scan he has evidence of bilateral hydronephrosis with left perinephric fluid most consistent with forniceal rupture.  There is also concern for skeletal metastatic disease as well as a small pulmonary nodule.  He reports over the past month and a half, he has had abdominal pain radiating to his back.  He is also had difficulty voiding and emptying his bladder.  He does have a history of BPH and underwent KTP laser ablation in 2014 by Dr. Jacqlyn Larsen.  His most recent PSA was 3.90 in 09/2015.  He denies a personal history of gross hematuria.  His daughter does mention that he has had a few urinalyses with microscopic blood in the recent past.  He also endorse of relatively poor appetite, weakness, difficulty getting out of bed.    Past Medical History:  Diagnosis Date  . Benign prostatic hypertrophy   . Bilateral carotid artery disease (HCC)    Mild plaque formation without obstructive disease noted on carotid Doppler.  . Coronary artery disease    Previous cardiac catheterization at [facility] in 2010. The patient was told about 2 blockages which did not require revascularization.  . Diabetes mellitus without complication ([place])   . Essential hypertension   . GERD (gastroesophageal reflux disease)   .  Hyperlipidemia   . Hypertension   . Left carotid artery stenosis     Past Surgical History:  Procedure Laterality Date  . CARDIAC CATHETERIZATION  2010  . CARDIAC CATHETERIZATION N/A 02/03/2015   Procedure: Left Heart Cath and Coronary Angiography;  Surgeon: Wellington [name], MD;  Location: [place] [facility];  Service: Cardiovascular;  Laterality: N/A;  . CATARACT EXTRACTION    . PROSTATECTOMY      Home Medications:  Current Meds  Medication Sig  . aspirin (GOODSENSE ASPIRIN) 325 MG tablet Take by mouth.  [name] atorvastatin (LIPITOR) 10 MG tablet Take 10 mg by mouth daily.  . Calcium-Vitamin D 600-200 MG-UNIT tablet Take by mouth.  . cetirizine (ZYRTEC) 10 MG tablet Take 1 tablet (10 mg total) by mouth daily.  . finasteride (PROSCAR) 5 MG tablet TAKE 1 TABLET (5 MG TOTAL) BY MOUTH DAILY.  [name] glimepiride (AMARYL) 4 MG tablet Take 1 tablet (4 mg total) by mouth 2 (two) times daily.  [name] lisinopril (PRINIVIL,ZESTRIL) 2.5 MG tablet Take 2.5 mg by mouth daily.  . metoprolol succinate (TOPROL-XL) 25 MG 24 hr tablet Take 25 mg by mouth daily.   . Multiple Vitamins-Minerals (MULTIVITAMIN WITH MINERALS) tablet Take 1 tablet by mouth daily.   [name] omeprazole (PRILOSEC) 20 MG capsule Take 20 mg by mouth daily.  [name] oseltamivir (TAMIFLU) 75 MG capsule Take 1 capsule (75 mg total) by mouth 2 (two) times daily for 5 days.    Allergies:  Allergies  Allergen Reactions  . Zosyn [Piperacillin Sod-Tazobactam So] Swelling  Lips swollen.  Was given at hospital through IV    Family History  Problem Relation Age of Onset  . Hypertension Father     Social History:  reports that  has never smoked. he has never used smokeless tobacco. He reports that he does not drink alcohol or use drugs.  ROS: A complete review of systems was performed.  All systems are negative except for pertinent findings as noted.  Physical Exam:  Vital signs in last 24 hours: Temp:  [97.8 F (36.6 C)-98.6 F (37 C)] 98.6 F  (37 C) (02/20 1305) Pulse Rate:  [86-96] 90 (02/20 1305) Resp:  [16-18] 18 (02/20 1305) BP: (160-163)/(78-84) 160/78 (02/20 1305) SpO2:  [98 %-100 %] 100 % (02/20 1305) Weight:  [129 lb 3 oz (58.6 kg)-133 lb 9.6 oz (60.6 kg)] 129 lb 3 oz (58.6 kg) (02/20 1035) Constitutional: Ill-appearing male lying in bed.  Daughter at bedside. HEENT: [place] AT, moist mucus membranes.  Trachea midline, no masses Cardiovascular: No clubbing, cyanosis, or edema. Respiratory: Normal respiratory effort, no increased work of breathing. GI: Abdomen is mildly distended and tympanitic.  His bladder is tender and palpable to the level of the umbilicus. GU: Uncircumcised phallus with mild phimosis.  Patent meatus.  Bilateral descended testicles. Skin: No rashes, bruises or suspicious lesions Neurologic: Grossly intact, no focal deficits, moving all 4 extremities Psychiatric: Normal mood and affect   Laboratory Data:  Recent Labs    04/11/17 1210  WBC 5.5  HGB 9.7*  HCT 29.9*   Recent Labs    04/11/17 1210  NA 132*  K 3.9  CL 98*  CO2 22  GLUCOSE 289*  BUN 32*  CREATININE 1.42*  CALCIUM 8.4*   No results for input(s): LABPT, INR in the last 72 hours. No results for input(s): LABURIN in the last 72 hours. Results for orders placed or performed during the hospital encounter of 04/08/17  Urine C&S     Status: None   Collection Time: 04/08/17 10:16 AM  Result Value Ref Range Status   Specimen Description   Final    URINE, CLEAN CATCH Performed at [facility], [address] [place], [place] [address]    Special Requests   Final    NONE Performed at [facility], [address], [place], [place] [address]    Culture   Final    NO GROWTH Performed at [facility], [place] [address] [place], [place] [address]    Report Status 04/09/2017 FINAL  Final  Culture, blood (Routine x 2)     Status: None (Preliminary result)   Collection Time: 04/08/17 10:26 AM  Result Value  Ref Range Status   Specimen Description BLOOD BLOOD RIGHT ARM  Final   Special Requests   Final    BOTTLES DRAWN AEROBIC AND ANAEROBIC Blood Culture results may not be optimal due to an excessive volume of blood received in culture bottles   Culture   Final    NO GROWTH 3 DAYS Performed at [facility], [address], [place],  [address]    Report Status PENDING  Incomplete  Culture, blood (Routine x 2)     Status: None (Preliminary result)   Collection Time: 04/08/17 10:26 AM  Result Value Ref Range Status   Specimen Description BLOOD BLOOD LEFT FOREARM  Final   Special Requests   Final    BOTTLES DRAWN AEROBIC AND ANAEROBIC Blood Culture results may not be optimal due to an excessive  volume of blood received in culture bottles   Culture   Final    NO GROWTH 3 DAYS Performed at [facility], [place] [place],  [address]    Report Status PENDING  Incomplete     Radiologic Imaging: Ct Abdomen Pelvis W Contrast  Result Date: 04/11/2017 CLINICAL DATA:  Abdominal pain with radiation to his back. Symptoms for 6 weeks. Dysuria. Chronic diarrhea. Recently diagnosed with flu. Anemic. Prior prostatectomy per report. EXAM: CT ABDOMEN AND PELVIS WITH CONTRAST TECHNIQUE: Multidetector CT imaging of the abdomen and pelvis was performed using the standard protocol following bolus administration of intravenous contrast. CONTRAST:  48mL ISOVUE-300 IOPAMIDOL (ISOVUE-300) INJECTION 61% COMPARISON:  Chest CT 03/01/2015. No prior dedicated abdominopelvic imaging. FINDINGS: Lower chest: Right middle lobe pulmonary nodule of 6 mm on image 3/4. Suspect centrilobular emphysema. Normal heart size with tiny left pleural effusion. Right coronary artery atherosclerosis. Retrocrural adenopathy at 10 mm on image 15/2. Hepatobiliary: Subtle irregular hepatic capsule may be artifactual and related to CT reconstruction technique. No focal liver lesion. Normal gallbladder, without  biliary ductal dilatation. Pancreas: Normal, without mass or ductal dilatation. Spleen: Normal in size, without focal abnormality. Adrenals/Urinary Tract: Mild right adrenal thickening. Normal left adrenal gland. Too small to characterize interpolar right renal lesion is most likely a cyst. Moderate bilateral hydroureteronephrosis. This is slightly worse on the left, with marked left perinephric interstitial thickening, possibly related to forniceal rupture. Hydroureter is followed only to the upper abdomen, likely obstructed by retroperitoneal adenopathy. Bladder tumor is eccentric right, including on image 62/2. There is more inferior masslike right bladder placed tumor including at 4.4 x 3.5 cm on image 67/2. Stomach/Bowel: Proximal gastric underdistention. Rectal wall thickening is contiguous with bladder and prostatic soft tissue fullness, including on image 71/series 2. There is a large amount of colonic stool within the more proximal colon, suggesting a component of low-grade obstruction. Normal terminal ileum. Normal small bowel. Vascular/Lymphatic: Advanced aortic and branch vessel atherosclerosis. Bulky retroperitoneal adenopathy. Left periaortic nodal tissue surrounds the aorta and measures 5.0 x 3.6 cm on image 39/2. Marked pelvic sidewall adenopathy, including a right external iliac nodal mass of 3.9 x 5.4 cm on image 57/2. Reproductive: Despite the clinical history, the prostate appears present including on image 72/series 2. Soft tissue is positioned between the posterior right bladder wall and rectum, contiguous with the superior aspect of the prostate and right seminal vesicle. Example image 66/2. Other: Perirectal edema. No abdominopelvic ascites. Tiny fat containing left inguinal hernia. Musculoskeletal: Bilateral L5 pars defects with grade 2 L5-S1 anterolisthesis. Heterogeneous marrow density, including in the right-side of the L1 vertebral body on image 47/6. IMPRESSION: 1. Widespread metastatic  disease from a pelvic primary. Infiltrative soft tissue involves the bladder, rectum, and superior aspect of the prostate. Bladder primary is slightly favored. Prostatic or rectal primary is felt less likely. Given the clinical history and appearance of the rectum, fistulous communication between bladder and rectum cannot be excluded. No gas within the bladder to confirm fistula. 2. Suspect a component of bowel obstruction at the level of rectal involvement. 3. Extensive nodal metastasis within the abdomen and retrocrural space of the lower chest. 4. Bilateral hydronephrosis, likely related to retroperitoneal adenopathy. Extensive left perirenal edema, suggesting forniceal rupture. 5. Tiny left pleural effusion. 6. Coronary artery atherosclerosis. Aortic Atherosclerosis (ICD10-I70.0). 7. Suspect osseous metastasis, most apparent at L1. 8. Nonspecific right middle lobe pulmonary nodule. These results will be called to the ordering clinician or representative by the  Psychologist, clinical, and communication documented in the PACS or zVision Dashboard. Electronically Signed   By: Abigail Miyamoto M.D.   On: 04/11/2017 16:00   CT scan was personally reviewed today.  It was also discussed with interventional radiologist today.  Procedure: In the setting of patient's difficulty voiding and a markedly distended bladder with bilateral hydronephrosis, I would recommend a Foley catheter.  Attempted first to place a 83 [place] coud unsuccessfully using standard sterile technique.  I then attempted multiple additional catheters including a 12 [place], 14 [place], and 18 [place] all of which were unsuccessful.  I was able to get the 18 French catheter to the bladder neck which drained a small amount of urine but did not flush easily and did not at this point in time, attempts were abandoned.   Impression/Plan: 84 year old male with failure to thrive, influenza A presenting with abdominal pain found to have incidental advanced  metastatic disease as described above likely pelvic in origin.  1. Urinary retention- Despite multiple attempts, I was unable to place a Foley catheter today at the bedside.  Patient was quite uncomfortable.  Rather than continuing proceeding to the operating room given the patient is currently n.p.o. placed catheter under anesthesia.  This may involve urethral dilation.  Risk and benefits were discussed in detail with the aid of a translator.  2. Bilateral hydronephrosis, acute kidney injury-creatinine up to 1.4, likely multifactorial but also certainly has an obstructive component.  It is unclear whether this is related to distended bladder or obstructive adenopathy.  Since going we are planning on going to the operating room to place a Foley catheter, will plan for bilateral retrogrades and possible internal ureteral stent placements if possible.  Otherwise, will place a Foley catheter, see if his hydronephrosis resolves, and if it does not, then plan for bilateral percutaneous nephrostomy tubes.  This is also discussed with interventional radiology well.  3.  Locally advanced metastatic malignancy of unknown primary-PSA pending.  Rectal exam the time of surgery planned.  Suspect bladder is primary, will attempt to obtain tissue at the time of catheter placement in the operating room this evening.  If unable to do so or concern for extensive bleeding due to disruption of the tumor, will proceed with IR biopsy of retroperitoneal or pelvic adenopathy.  This was also discussed with interventional radiology.  Oncology consult pending.     All findings were discussed extensively with the patient's daughter as well as evening.  She requests that at this point in time, we do not use the word "cancer" with her father.  She would prefer [place] to use enlarged lymph nodes and mass until we have a final diagnosis.  04/11/2017, 5:17 PM  [name],  MD  Case was discussed personally with Dr. Earleen Newport as well as  Dr. Pascal Lux (IR).  I spent 80 min with this patient of which greater than 50% was spent in counseling and coordination of care with the patient.

## 2017-04-11 NOTE — Progress Notes (Signed)
Patient ID: Michael [name], male   DOB: 10/05/1933, 84 y.o.   MRN: [id number]  Got called with the radiology results of his CT scan.  I spoke with Dr. Erlene Quan urology and she will review the CAT scan and decide on what to biopsy.  Could be bladder versus prostate etiology with metastases.    I spoke with nursing staff to place a Foley catheter to decompress the hydronephrosis.  I spoke with the patient's daughter on the phone because she is able to speak [place].  She did not want me to tell the patient about this diagnosis right at this point.  I explained that if we get a biopsy will have to talk about why we are getting the biopsy and what we are doing this for.  She did not want to worry him today.  I will allow him to eat today.  I will stop his aspirin and not give Lovenox injections because he will likely need a biopsy. I will also get oncology consultation   DVT prophylaxis.  Dr. Loletha Grayer

## 2017-04-11 NOTE — H&P (Addendum)
[place] [facility] [place] NAME: Michael [name]    MR#:  [id number]  DATE OF BIRTH:  26-Jan-1934  DATE OF ADMISSION:  04/11/2017  PRIMARY CARE PHYSICIAN: [name], MD   REQUESTING/REFERRING PHYSICIAN: Dr Clayborn Bigness  CHIEF COMPLAINT:  Direct admit for abdominal pain, diarrhea and not eating  HISTORY OF PRESENT ILLNESS:  Michael [name]  is a 84 y.o. male with a known history of CAD, peripheral vascular disease, diabetes hypertension and hyperlipidemia.  He presents with severe abdominal pain radiating to his back.  Nothing made the pain better or worse.  Severe 10 out of 10 in intensity.  He states this is been going on for the past 1-1/2 months.  He is also been having trouble urinating.  He has been having diarrhea 5-10 times per day even waking him up at night.  Decreased appetite and unable to eat much.  No blood in the bowel movements.  Some nausea vomiting on Sunday.  He was recently diagnosed with flu and started on Tamiflu.  He has been feeling really weak and unable to get out of bed.  He was seen by his PMD today and sent in for direct admission for further evaluation.  Translation was via phone provided by the hospital.  PAST MEDICAL HISTORY:   Past Medical History:  Diagnosis Date  . Benign prostatic hypertrophy   . Bilateral carotid artery disease (HCC)    Mild plaque formation without obstructive disease noted on carotid Doppler.  . Coronary artery disease    Previous cardiac catheterization at [facility] in 2010. The patient was told about 2 blockages which did not require revascularization.  . Diabetes mellitus without complication ([place])   . Essential hypertension   . GERD (gastroesophageal reflux disease)   . Hyperlipidemia   . Hypertension   . Left carotid artery stenosis     PAST SURGICAL HISTORY:   Past Surgical History:  Procedure Laterality Date  . CARDIAC CATHETERIZATION  2010  . CARDIAC CATHETERIZATION N/A  02/03/2015   Procedure: Left Heart Cath and Coronary Angiography;  Surgeon: Wellington [name], MD;  Location: [place] [facility];  Service: Cardiovascular;  Laterality: N/A;  . CATARACT EXTRACTION    . PROSTATECTOMY      SOCIAL HISTORY:   Social History   Tobacco Use  . Smoking status: Never Smoker  . Smokeless tobacco: Never Used  Substance Use Topics  . Alcohol use: No    FAMILY HISTORY:   Family History  Problem Relation Age of Onset  . Hypertension Father     DRUG ALLERGIES:   Allergies  Allergen Reactions  . Zosyn [Piperacillin Sod-Tazobactam So] Swelling    Lips swollen.  Was given at hospital through IV    REVIEW OF SYSTEMS:  CONSTITUTIONAL: No fever, chills or sweats.  Positive for weakness.  EYES: No blurred or double vision.  Wears glasses EARS, NOSE, AND THROAT: No tinnitus or ear pain.  some sore throat RESPIRATORY: Some cough, and shortness of breath.  No wheezing or hemoptysis.  CARDIOVASCULAR: No chest pain, orthopnea, edema.  GASTROINTESTINAL: Some nausea, vomiting.  Positive for diarrhea and abdominal pain. No blood in bowel movements GENITOURINARY: Positive for dysuria, no hematuria.  ENDOCRINE: No polyuria, nocturia,  HEMATOLOGY: No anemia, easy bruising or bleeding SKIN: No rash or lesion. MUSCULOSKELETAL: Back pain NEUROLOGIC: No tingling, numbness, weakness.  Dizzy when he stands PSYCHIATRY: History of anxiety after losing his son to cancer  MEDICATIONS AT HOME:  Prior to Admission medications   Medication Sig Start Date End Date Taking? Authorizing Provider  aspirin (GOODSENSE ASPIRIN) 325 MG tablet Take by mouth.    [provider]  atorvastatin (LIPITOR) 10 MG tablet Take 10 mg by mouth daily.    [provider]  calcium gluconate 500 MG tablet Take 1 tablet by mouth daily.    [provider]  Calcium-Vitamin D 600-200 MG-UNIT tablet Take by mouth. 03/22/12   [provider]  cetirizine (ZYRTEC) 10 MG  tablet Take 1 tablet (10 mg total) by mouth daily. 02/07/17   Ronnell Freshwater, NP  finasteride (PROSCAR) 5 MG tablet TAKE 1 TABLET (5 MG TOTAL) BY MOUTH DAILY. 11/11/14   [provider]  glimepiride (AMARYL) 4 MG tablet Take 1 tablet (4 mg total) by mouth 2 (two) times daily. 04/05/17   Ronnell Freshwater, NP  lisinopril (PRINIVIL,ZESTRIL) 2.5 MG tablet Take 2.5 mg by mouth daily. 03/14/17   [provider]  metoprolol succinate (TOPROL-XL) 25 MG 24 hr tablet Take 25 mg by mouth daily.  12/22/14   [provider]  Multiple Vitamin (MULTIVITAMIN) tablet Take 1 tablet by mouth daily.    [provider]  Multiple Vitamins-Minerals (MULTIVITAMIN WITH MINERALS) tablet Take by mouth. 03/22/12   [provider]  omeprazole (PRILOSEC) 20 MG capsule Take 20 mg by mouth daily. 01/04/15   [provider]  oseltamivir (TAMIFLU) 75 MG capsule Take 1 capsule (75 mg total) by mouth 2 (two) times daily for 5 days. 04/08/17 04/13/17  Lisa Roca, MD      VITAL SIGNS:  Blood pressure (!) 163/84, pulse 86, temperature 97.8 F (36.6 C), temperature source Oral, resp. rate 18, height 5\' 6"  (1.676 m), weight 58.6 kg (129 lb 3 oz), SpO2 100 %.  PHYSICAL EXAMINATION:  GENERAL:  84 y.o.-year-old patient lying in the bed with no acute distress.  EYES: Pupils equal, round, reactive to light and accommodation. No scleral icterus. Extraocular muscles intact.  HEENT: Head atraumatic, normocephalic. Oropharynx and nasopharynx clear.  NECK:  Supple, no jugular venous distention. No thyroid enlargement, no tenderness.  LUNGS: Normal breath sounds bilaterally, no wheezing, rales,rhonchi or crepitation. No use of accessory muscles of respiration.  CARDIOVASCULAR: S1, S2 normal. No murmurs, rubs, or gallops.  ABDOMEN: Soft, positive abdominal tendernesss, nondistended. Bowel sounds present. No organomegaly or mass.  EXTREMITIES: No pedal edema, cyanosis, or clubbing.   NEUROLOGIC: Cranial nerves II through XII are intact. Muscle strength 4/5 in all extremities. Sensation intact. Gait not checked.  PSYCHIATRIC: The patient is alert and oriented x 3.  SKIN: No rash, lesion, or ulcer.   LABORATORY PANEL:   CBC Recent Labs  Lab 04/11/17 1210  WBC 5.5  HGB 9.7*  HCT 29.9*  PLT 269   ------------------------------------------------------------------------------------------------------------------  Chemistries  Recent Labs  Lab 04/11/17 1210  NA 132*  K 3.9  CL 98*  CO2 22  GLUCOSE 289*  BUN 32*  CREATININE 1.42*  CALCIUM 8.4*  AST 37  ALT 19  ALKPHOS 107  BILITOT 0.5   ------------------------------------------------------------------------------------------------------------------   IMPRESSION AND PLAN:   1.  Abdominal pain, diarrhea.  Obtain CT scan of the abdomen and pelvis.  Send off stool studies if further diarrhea. 2.  Acute kidney injury and dehydration give gentle IV fluid hydration.  Hold ACE inhibitor. 3.  History of CAD on aspirin and Toprol 4.  Hyperlipidemia unspecified on statin. 5.  Anemia guaiac stools send off a ferritin 6.  Essential  hypertension continue beta-blocker 7.  BPH on finasteride 8.  Influenza A positive continue Tamiflu 9.  Weakness will get physical therapy evaluation 10.  Type 2 diabetes mellitus.  Start sliding scale insulin and hold Amaryl  All the records are reviewed and case discussed with ED provider. Management plans discussed with the patient, family and they are in agreement.  CODE STATUS: Full code  TOTAL TIME TAKING CARE OF THIS PATIENT: 50 minutes.    Loletha Grayer M.D on 04/11/2017 at 1:05 PM  Between 7am to 6pm - Pager - [phone]  After 6pm call admission pager [phone]  [facility] Office  [phone]  CC: Primary care physician; [name], MD

## 2017-04-11 NOTE — Anesthesia Post-op Follow-up Note (Signed)
Anesthesia QCDR form completed.        

## 2017-04-11 NOTE — Anesthesia Procedure Notes (Signed)
Procedure Name: LMA Insertion Date/Time: 04/11/2017 6:56 PM Performed by: Lendon Colonel, CRNA Pre-anesthesia Checklist: Patient identified, Patient being monitored, Timeout performed, Emergency Drugs available and Suction available Patient Re-evaluated:Patient Re-evaluated prior to induction Oxygen Delivery Method: Circle system utilized Preoxygenation: Pre-oxygenation with 100% oxygen Induction Type: IV induction Ventilation: Mask ventilation without difficulty LMA: LMA inserted LMA Size: 4.0 Tube type: Oral Number of attempts: 1 Placement Confirmation: positive ETCO2 and breath sounds checked- equal and bilateral Tube secured with: Tape Dental Injury: Teeth and Oropharynx as per pre-operative assessment

## 2017-04-12 ENCOUNTER — Inpatient Hospital Stay: Payer: Medicare Other

## 2017-04-12 ENCOUNTER — Encounter: Payer: Self-pay | Admitting: Urology

## 2017-04-12 DIAGNOSIS — K219 Gastro-esophageal reflux disease without esophagitis: Secondary | ICD-10-CM

## 2017-04-12 DIAGNOSIS — R599 Enlarged lymph nodes, unspecified: Secondary | ICD-10-CM

## 2017-04-12 DIAGNOSIS — R918 Other nonspecific abnormal finding of lung field: Secondary | ICD-10-CM

## 2017-04-12 DIAGNOSIS — N400 Enlarged prostate without lower urinary tract symptoms: Secondary | ICD-10-CM

## 2017-04-12 DIAGNOSIS — R300 Dysuria: Secondary | ICD-10-CM

## 2017-04-12 DIAGNOSIS — I6522 Occlusion and stenosis of left carotid artery: Secondary | ICD-10-CM

## 2017-04-12 DIAGNOSIS — R110 Nausea: Secondary | ICD-10-CM

## 2017-04-12 DIAGNOSIS — E785 Hyperlipidemia, unspecified: Secondary | ICD-10-CM

## 2017-04-12 DIAGNOSIS — R590 Localized enlarged lymph nodes: Secondary | ICD-10-CM

## 2017-04-12 DIAGNOSIS — K5900 Constipation, unspecified: Secondary | ICD-10-CM

## 2017-04-12 DIAGNOSIS — C801 Malignant (primary) neoplasm, unspecified: Secondary | ICD-10-CM

## 2017-04-12 DIAGNOSIS — N1330 Unspecified hydronephrosis: Secondary | ICD-10-CM

## 2017-04-12 DIAGNOSIS — R197 Diarrhea, unspecified: Secondary | ICD-10-CM

## 2017-04-12 DIAGNOSIS — R634 Abnormal weight loss: Secondary | ICD-10-CM

## 2017-04-12 DIAGNOSIS — D509 Iron deficiency anemia, unspecified: Secondary | ICD-10-CM

## 2017-04-12 DIAGNOSIS — I2510 Atherosclerotic heart disease of native coronary artery without angina pectoris: Secondary | ICD-10-CM

## 2017-04-12 DIAGNOSIS — C799 Secondary malignant neoplasm of unspecified site: Secondary | ICD-10-CM

## 2017-04-12 DIAGNOSIS — E119 Type 2 diabetes mellitus without complications: Secondary | ICD-10-CM

## 2017-04-12 DIAGNOSIS — I10 Essential (primary) hypertension: Secondary | ICD-10-CM

## 2017-04-12 LAB — GLUCOSE, CAPILLARY
Glucose-Capillary: 153 mg/dL — ABNORMAL HIGH (ref 65–99)
Glucose-Capillary: 163 mg/dL — ABNORMAL HIGH (ref 65–99)
Glucose-Capillary: 209 mg/dL — ABNORMAL HIGH (ref 65–99)
Glucose-Capillary: 204 mg/dL — ABNORMAL HIGH (ref 65–99)
Glucose-Capillary: 147 mg/dL — ABNORMAL HIGH (ref 65–99)

## 2017-04-12 LAB — CBC
HCT: 28.7 % — ABNORMAL LOW (ref 40.0–52.0)
Hemoglobin: 9.5 g/dL — ABNORMAL LOW (ref 13.0–18.0)
MCH: 27.6 pg (ref 26.0–34.0)
MCHC: 33.1 g/dL (ref 32.0–36.0)
MCV: 83.5 fL (ref 80.0–100.0)
Platelets: 253 10*3/uL (ref 150–440)
RBC: 3.44 MIL/uL — ABNORMAL LOW (ref 4.40–5.90)
RDW: 14.4 % (ref 11.5–14.5)
WBC: 5.4 10*3/uL (ref 3.8–10.6)

## 2017-04-12 LAB — BASIC METABOLIC PANEL
Anion gap: 10 (ref 5–15)
BUN: 24 mg/dL — ABNORMAL HIGH (ref 6–20)
CO2: 24 mmol/L (ref 22–32)
Calcium: 8 mg/dL — ABNORMAL LOW (ref 8.9–10.3)
Chloride: 101 mmol/L (ref 101–111)
Creatinine, Ser: 1.17 mg/dL (ref 0.61–1.24)
GFR calc Af Amer: >60 mL/min (ref >60)
GFR calc non Af Amer: 57 mL/min — ABNORMAL LOW (ref >60)
Glucose, Bld: 193 mg/dL — ABNORMAL HIGH (ref 65–99)
Potassium: 4.1 mmol/L (ref 3.5–5.1)
Sodium: 135 mmol/L (ref 135–145)

## 2017-04-12 LAB — PROTIME-INR
INR: 1.19
Prothrombin Time: 15 seconds (ref 11.4–15.2)

## 2017-04-12 LAB — PSA: Prostatic Specific Antigen: 9.02 ng/mL — ABNORMAL HIGH (ref 0.00–4.00)

## 2017-04-12 LAB — VITAMIN B12: Vitamin B-12: 909 pg/mL (ref 180–914)

## 2017-04-12 LAB — OCCULT BLOOD X 1 CARD TO LAB, STOOL: Fecal Occult Bld: NEGATIVE

## 2017-04-12 MED ORDER — GUAIFENESIN-DM 100-10 MG/5ML PO SYRP
5.0000 mL | ORAL_SOLUTION | ORAL | Status: DC | PRN
  Administered 2017-04-12 – 2017-04-14 (×4): 5 mL via ORAL
  Filled 2017-04-12 (×5): qty 5

## 2017-04-12 MED ORDER — SODIUM CHLORIDE 0.9 % IV SOLN: INTRAVENOUS | Status: DC

## 2017-04-12 MED ORDER — SENNOSIDES-DOCUSATE SODIUM 8.6-50 MG PO TABS
2.0000 | ORAL_TABLET | Freq: Two times a day (BID) | ORAL | Status: DC
  Administered 2017-04-13 – 2017-04-16 (×5): 2 via ORAL
  Filled 2017-04-12 (×5): qty 2

## 2017-04-12 NOTE — Progress Notes (Signed)
Urology Consult Follow Up  Subjective: More comfortable today, abdominal distention and discomfort improving with Foley catheter placement.  Creatinine improved.  Daughter at bedside.  Anti-infectives: Anti-infectives (From admission, onward)   Start     Dose/Rate Route Frequency Ordered Stop   04/11/17 1330  oseltamivir (TAMIFLU) capsule 75 mg     75 mg Oral 2 times daily 04/11/17 1304 04/16/17 0959      Current Facility-Administered Medications  Medication Dose Route Frequency Provider Last Rate Last Dose  . 0.9 %  sodium chloride infusion   Intravenous Continuous Loletha Grayer, MD 75 mL/hr at 04/12/17 0650    . 0.9 %  sodium chloride infusion   Intravenous Continuous Markus Daft, MD      . acetaminophen (TYLENOL) tablet 650 mg  650 mg Oral Q6H PRN Wieting, Richard, MD       Or  . acetaminophen (TYLENOL) suppository 650 mg  650 mg Rectal Q6H PRN Wieting, Richard, MD      . atorvastatin (LIPITOR) tablet 10 mg  10 mg Oral Daily Loletha Grayer, MD   10 mg at 04/12/17 0751  . finasteride (PROSCAR) tablet 5 mg  5 mg Oral Daily Loletha Grayer, MD   5 mg at 04/12/17 0751  . insulin aspart (novoLOG) injection 0-5 Units  0-5 Units Subcutaneous QHS Wieting, Richard, MD      . insulin aspart (novoLOG) injection 0-9 Units  0-9 Units Subcutaneous TID WC Loletha Grayer, MD   3 Units at 04/12/17 1219  . loratadine (CLARITIN) tablet 10 mg  10 mg Oral Daily Loletha Grayer, MD   10 mg at 04/12/17 0751  . metoprolol succinate (TOPROL-XL) 24 hr tablet 25 mg  25 mg Oral Daily Loletha Grayer, MD   25 mg at 04/12/17 0751  . morphine 2 MG/ML injection 2 mg  2 mg Intravenous Q4H PRN Loletha Grayer, MD      . multivitamin with minerals tablet 1 tablet  1 tablet Oral Daily Loletha Grayer, MD   1 tablet at 04/12/17 0751  . ondansetron (ZOFRAN) tablet 4 mg  4 mg Oral Q6H PRN Loletha Grayer, MD       Or  . ondansetron [facility]) injection 4 mg  4 mg Intravenous Q6H PRN Wieting, Richard, MD      .  oseltamivir (TAMIFLU) capsule 75 mg  75 mg Oral BID Loletha Grayer, MD   75 mg at 04/12/17 0753  . oxyCODONE (Oxy IR/ROXICODONE) immediate release tablet 5 mg  5 mg Oral Q4H PRN Wieting, Richard, MD      . pantoprazole (PROTONIX) EC tablet 40 mg  40 mg Oral Daily Loletha Grayer, MD   40 mg at 04/12/17 0751  . senna-docusate (Senokot-S) tablet 2 tablet  2 tablet Oral BID Max Sane, MD         Objective: Vital signs in last 24 hours: Temp:  [97.8 F (36.6 C)-98.8 F (37.1 C)] 98.6 F (37 C) (02/21 1202) Pulse Rate:  [92-100] 92 (02/21 1202) Resp:  [16-18] 16 (02/21 1202) BP: (135-151)/(68-88) 138/68 (02/21 1202) SpO2:  [96 %-100 %] 98 % (02/21 1202)  Intake/Output from previous day: 02/20 0701 - 02/21 0700 In: 1075 [I.V.:1075] Out: 1150 [Urine:1150] Intake/Output this shift: Total I/O In: -  Out: 1200 [Urine:1200]   Physical Exam No acute distress. Abdomen soft, nontender, bladder nonpalpable Foley catheter in place draining light pink urine, no clots  Lab Results:  Recent Labs    04/11/17 1210 04/12/17 0419  WBC 5.5 5.4  HGB  9.7* 9.5*  HCT 29.9* 28.7*  PLT 269 253   BMET Recent Labs    04/11/17 1210 04/12/17 0419  NA 132* 135  K 3.9 4.1  CL 98* 101  CO2 22 24  GLUCOSE 289* 193*  BUN 32* 24*  CREATININE 1.42* 1.17  CALCIUM 8.4* 8.0*   PT/INR Recent Labs    04/11/17 1719 04/12/17 1139  LABPROT 14.3 15.0  INR 1.12 1.19   ABG No results for input(s): PHART, HCO3 in the last 72 hours.  Invalid input(s): PCO2, PO2  Studies/Results: Ct Abdomen Pelvis W Contrast  Result Date: 04/11/2017 CLINICAL DATA:  Abdominal pain with radiation to his back. Symptoms for 6 weeks. Dysuria. Chronic diarrhea. Recently diagnosed with flu. Anemic. Prior prostatectomy per report. EXAM: CT ABDOMEN AND PELVIS WITH CONTRAST TECHNIQUE: Multidetector CT imaging of the abdomen and pelvis was performed using the standard protocol following bolus administration of  intravenous contrast. CONTRAST:  32mL ISOVUE-300 IOPAMIDOL (ISOVUE-300) INJECTION 61% COMPARISON:  Chest CT 03/01/2015. No prior dedicated abdominopelvic imaging. FINDINGS: Lower chest: Right middle lobe pulmonary nodule of 6 mm on image 3/4. Suspect centrilobular emphysema. Normal heart size with tiny left pleural effusion. Right coronary artery atherosclerosis. Retrocrural adenopathy at 10 mm on image 15/2. Hepatobiliary: Subtle irregular hepatic capsule may be artifactual and related to CT reconstruction technique. No focal liver lesion. Normal gallbladder, without biliary ductal dilatation. Pancreas: Normal, without mass or ductal dilatation. Spleen: Normal in size, without focal abnormality. Adrenals/Urinary Tract: Mild right adrenal thickening. Normal left adrenal gland. Too small to characterize interpolar right renal lesion is most likely a cyst. Moderate bilateral hydroureteronephrosis. This is slightly worse on the left, with marked left perinephric interstitial thickening, possibly related to forniceal rupture. Hydroureter is followed only to the upper abdomen, likely obstructed by retroperitoneal adenopathy. Bladder tumor is eccentric right, including on image 62/2. There is more inferior masslike right bladder placed tumor including at 4.4 x 3.5 cm on image 67/2. Stomach/Bowel: Proximal gastric underdistention. Rectal wall thickening is contiguous with bladder and prostatic soft tissue fullness, including on image 71/series 2. There is a large amount of colonic stool within the more proximal colon, suggesting a component of low-grade obstruction. Normal terminal ileum. Normal small bowel. Vascular/Lymphatic: Advanced aortic and branch vessel atherosclerosis. Bulky retroperitoneal adenopathy. Left periaortic nodal tissue surrounds the aorta and measures 5.0 x 3.6 cm on image 39/2. Marked pelvic sidewall adenopathy, including a right external iliac nodal mass of 3.9 x 5.4 cm on image 57/2. Reproductive:  Despite the clinical history, the prostate appears present including on image 72/series 2. Soft tissue is positioned between the posterior right bladder wall and rectum, contiguous with the superior aspect of the prostate and right seminal vesicle. Example image 66/2. Other: Perirectal edema. No abdominopelvic ascites. Tiny fat containing left inguinal hernia. Musculoskeletal: Bilateral L5 pars defects with grade 2 L5-S1 anterolisthesis. Heterogeneous marrow density, including in the right-side of the L1 vertebral body on image 47/6. IMPRESSION: 1. 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 Infiltrative soft tissue involves the bladder, rectum, and superior aspect of the prostate. Bladder primary is slightly favored. Prostatic or rectal primary is felt less likely. Given the clinical history and appearance of the rectum, fistulous communication between bladder and rectum cannot be excluded. No gas within the bladder to confirm fistula. 2. Suspect a component of bowel obstruction at the level of rectal involvement. 3. Extensive nodal metastasis within the abdomen and retrocrural space of the lower chest. 4. Bilateral hydronephrosis, likely related to  retroperitoneal adenopathy. Extensive left perirenal edema, suggesting forniceal rupture. 5. Tiny left pleural effusion. 6. Coronary artery atherosclerosis. Aortic Atherosclerosis (ICD10-I70.0). 7. Suspect osseous metastasis, most apparent at L1. 8. Nonspecific right middle lobe pulmonary nodule. These results will be called to the ordering clinician or representative by the Radiologist Assistant, and communication documented in the PACS or zVision Dashboard. Electronically Signed   By: Abigail Miyamoto M.D.   On: 04/11/2017 16:00   Impression/Plan: 84 year old male with failure to thrive, influenza A presenting with abdominal pain found to have incidental advanced metastatic disease as described above likely pelvic in origin.  He is now postop day 1 status post  dilation of bladder neck, bladder biopsy, placement of left ureteral stent and exam under anesthesia.  1. Urinary retention-secondary to bladder neck contracture/pelvic mass.  Bladder neck dilated and Foley catheter placed in the operating room.  Maintain Foley.  Flush as needed.  2. Bilateral hydronephrosis, acute kidney injury-creatinine improving following left-sided stent placement and bladder decompression.  Unable to identify right ureteral orifice likely secondary to obstructing tumor.  Will follow up with a renal ultrasound tomorrow to assess for improvement of hydronephrosis.  If this is not improved, may consider placement of right percutaneous nephrostomy tube tomorrow.  This was also discussed with interventional radiology, Dr. Anselm Pancoast today as well.  3.  Locally advanced metastatic malignancy of unknown primary-PSA mildly elevated to 9.  Rectal exam was grossly abnormal with firm nodular prostate as well as a mass extending for the prostate.  Cystoscopy reveals primarily submucosal mass with mass-effect into the bladder other than necrotic material at the bladder neck which was biopsied.  The findings favor prostate cancer.  [facility] pathology pending, should have prelim results tomorrow.  If tumor is nonviable/necrotic, may require retroperitoneal biopsy.   Case is discussed with Dr. Manuella Ghazi, Dr. Rogue Bussing, Dr. Anselm Pancoast, and Dr. Rebecca Eaton this today.     LOS: 1 day    [name] 04/12/2017

## 2017-04-12 NOTE — Consult Note (Signed)
[place] NOTE  Patient Care Team: [name], MD as PCP - General (Internal Medicine)  CHIEF COMPLAINTS/PURPOSE OF CONSULTATION:  Metastatic disease in pelvis  HISTORY OF PRESENTING ILLNESS: Patient speaks limited English; most of the history is taken talking with the daughter by the bedside.  Michael [name] 84 y.o.  male patient from [place]-is currently admitted the hospital for worsening abdominal pain for the last many months; also has been complaining of difficulty with urination.  Also had intermittent constipation with/diarrhea.  CT scan shows soft tissue infiltrative mass involving the bladder rectum the prostate.  PSA around 9.  Patient also has significant retroperitoneal adenopathy pelvic adenopathy; possible L2 lesion and also a solitary small lung nodule.   Patient yesterday was taken to the OR cystoscopy; urethral dilatation.  Had a biopsy-awaiting pathology.  As per family patient has been losing weight.  Complains of poor appetite.  Positive for nausea no vomiting.  Patient in general is very active otherwise.  Of note patient was recently diagnosed with influenza A; status post treatment.  ROS: A complete 10 point review of system is done which is negative except mentioned above in history of present illness  MEDICAL HISTORY:  Past Medical History:  Diagnosis Date  . Benign prostatic hypertrophy   . Bilateral carotid artery disease (HCC)    Mild plaque formation without obstructive disease noted on carotid Doppler.  . Coronary artery disease    Previous cardiac catheterization at [facility] in 2010. The patient was told about 2 blockages which did not require revascularization.  . Diabetes mellitus without complication ([place])   . Essential hypertension   . GERD (gastroesophageal reflux disease)   . Hyperlipidemia   . Hypertension   . Left carotid artery stenosis     SURGICAL HISTORY: Past Surgical History:  Procedure Laterality Date  .  CARDIAC CATHETERIZATION  2010  . CARDIAC CATHETERIZATION N/A 02/03/2015   Procedure: Left Heart Cath and Coronary Angiography;  Surgeon: Wellington [name], MD;  Location: [place] [facility];  Service: Cardiovascular;  Laterality: N/A;  . CATARACT EXTRACTION    . CYSTOSCOPY W/ RETROGRADES Bilateral 04/11/2017   Procedure: CYSTOSCOPY WITH RETROGRADE PYELOGRAM;  Surgeon: [name], MD;  Location: [facility];  Service: Urology;  Laterality: Bilateral;  . CYSTOSCOPY WITH STENT PLACEMENT Left 04/11/2017   Procedure: CYSTOSCOPY WITH STENT PLACEMENT and fulgeration;  Surgeon: [name], MD;  Location: [facility] OR[facility];  Service: Urology;  Laterality: Left;  . CYSTOSCOPY WITH URETHRAL DILATATION Bilateral 04/11/2017   Procedure: CYSTOSCOPY WITH URETHRAL DILATATION;  Surgeon: [name], MD;  Location: [facility] OR[facility];  Service: Urology;  Laterality: Bilateral;  . PROSTATECTOMY      SOCIAL HISTORY: Originally from [place]; living in [place] since 1998; he lives with daughter in [place]; no smoking or alcohol. Social History   Socioeconomic History  . Marital status: Widowed    Spouse name: Not on file  . Number of children: Not on file  . Years of education: Not on file  . Highest education level: Not on file  Social Needs  . Financial resource strain: Not on file  . Food insecurity - worry: Not on file  . Food insecurity - inability: Not on file  . Transportation needs - medical: Not on file  . Transportation needs - non-medical: Not on file  Occupational History  . Not on file  Tobacco Use  . Smoking status: Never Smoker  . Smokeless tobacco: Never Used  Substance and Sexual  Activity  . Alcohol use: No  . Drug use: No  . Sexual activity: Not on file  Other Topics Concern  . Not on file  Social History Narrative  . Not on file    FAMILY HISTORY: Family History  Problem Relation Age of Onset  . Hypertension Father     ALLERGIES:  is allergic to zosyn [piperacillin sod-tazobactam  so].  MEDICATIONS:  Current Facility-Administered Medications  Medication Dose Route Frequency Provider Last Rate Last Dose  . 0.9 %  sodium chloride infusion   Intravenous Continuous Loletha Grayer, MD 75 mL/hr at 04/12/17 0650    . 0.9 %  sodium chloride infusion   Intravenous Continuous Markus Daft, MD      . acetaminophen (TYLENOL) tablet 650 mg  650 mg Oral Q6H PRN Wieting, Richard, MD       Or  . acetaminophen (TYLENOL) suppository 650 mg  650 mg Rectal Q6H PRN Wieting, Richard, MD      . atorvastatin (LIPITOR) tablet 10 mg  10 mg Oral Daily Loletha Grayer, MD   10 mg at 04/12/17 0751  . finasteride (PROSCAR) tablet 5 mg  5 mg Oral Daily Loletha Grayer, MD   5 mg at 04/12/17 0751  . insulin aspart (novoLOG) injection 0-5 Units  0-5 Units Subcutaneous QHS Wieting, Richard, MD      . insulin aspart (novoLOG) injection 0-9 Units  0-9 Units Subcutaneous TID WC Loletha Grayer, MD   3 Units at 04/12/17 1219  . loratadine (CLARITIN) tablet 10 mg  10 mg Oral Daily Loletha Grayer, MD   10 mg at 04/12/17 0751  . metoprolol succinate (TOPROL-XL) 24 hr tablet 25 mg  25 mg Oral Daily Loletha Grayer, MD   25 mg at 04/12/17 0751  . morphine 2 MG/ML injection 2 mg  2 mg Intravenous Q4H PRN Loletha Grayer, MD      . multivitamin with minerals tablet 1 tablet  1 tablet Oral Daily Loletha Grayer, MD   1 tablet at 04/12/17 0751  . ondansetron (ZOFRAN) tablet 4 mg  4 mg Oral Q6H PRN Loletha Grayer, MD       Or  . ondansetron [facility]) injection 4 mg  4 mg Intravenous Q6H PRN Wieting, Richard, MD      . oseltamivir (TAMIFLU) capsule 75 mg  75 mg Oral BID Loletha Grayer, MD   75 mg at 04/12/17 0753  . oxyCODONE (Oxy IR/ROXICODONE) immediate release tablet 5 mg  5 mg Oral Q4H PRN Wieting, Richard, MD      . pantoprazole (PROTONIX) EC tablet 40 mg  40 mg Oral Daily Loletha Grayer, MD   40 mg at 04/12/17 0751  . senna-docusate (Senokot-S) tablet 2 tablet  2 tablet Oral BID Max Sane, MD           .  PHYSICAL EXAMINATION:  Vitals:   04/12/17 0503 04/12/17 1202  BP: 135/73 138/68  Pulse: 93 92  Resp: 18 16  Temp: 97.8 F (36.6 C) 98.6 F (37 C)  SpO2: 96% 98%   Filed Weights   04/11/17 1035  Weight: 129 lb 3 oz (58.6 kg)    GENERAL: Well-nourished well-developed; Alert, no distress and comfortable.   Accompanied by his daughter. EYES: no pallor or icterus OROPHARYNX: no thrush or ulceration. NECK: supple, no masses felt LYMPH:  no palpable lymphadenopathy in the cervical, axillary or inguinal regions LUNGS: decreased breath sounds to auscultation at bases and  No wheeze or crackles HEART/CVS: regular rate & rhythm and  no murmurs; No lower extremity edema ABDOMEN: abdomen soft, mild tender on deep palpation and normal bowel sounds Musculoskeletal:no cyanosis of digits and no clubbing  PSYCH: alert & oriented x 3 with fluent speech NEURO: no focal motor/sensory deficits SKIN:  no rashes or significant lesions  LABORATORY DATA:  I have reviewed the data as listed Lab Results  Component Value Date   WBC 5.4 04/12/2017   HGB 9.5 (L) 04/12/2017   HCT 28.7 (L) 04/12/2017   MCV 83.5 04/12/2017   PLT 253 04/12/2017   Recent Labs    04/08/17 1026 04/11/17 1210 04/12/17 0419  NA 130* 132* 135  K 3.6 3.9 4.1  CL 100* 98* 101  CO2 20* 22 24  GLUCOSE 239* 289* 193*  BUN 21* 32* 24*  CREATININE 1.23 1.42* 1.17  CALCIUM 8.6* 8.4* 8.0*  GFRNONAA 53* 45* 57*  GFRAA >60 52* >60  PROT 7.7 7.7  --   ALBUMIN 3.1* 3.1*  --   AST 44* 37  --   ALT 17 19  --   ALKPHOS 111 107  --   BILITOT 0.5 0.5  --     RADIOGRAPHIC STUDIES: I have personally reviewed the radiological images as listed and agreed with the findings in the report. Dg Chest 2 View  Result Date: 04/08/2017 CLINICAL DATA:  Left flank pain radiates to bladder. EXAM: CHEST  2 VIEW COMPARISON:  12/14/2016 FINDINGS: The lungs are clear without focal pneumonia, edema, pneumothorax or pleural effusion.  The cardiopericardial silhouette is within normal limits for size. The visualized bony structures of the thorax are intact. Telemetry leads overlie the chest. IMPRESSION: No active cardiopulmonary disease. Electronically Signed   By: Misty Stanley M.D.   On: 04/08/2017 10:57   Ct Abdomen Pelvis W Contrast  Result Date: 04/11/2017 CLINICAL DATA:  Abdominal pain with radiation to his back. Symptoms for 6 weeks. Dysuria. Chronic diarrhea. Recently diagnosed with flu. Anemic. Prior prostatectomy per report. EXAM: CT ABDOMEN AND PELVIS WITH CONTRAST TECHNIQUE: Multidetector CT imaging of the abdomen and pelvis was performed using the standard protocol following bolus administration of intravenous contrast. CONTRAST:  98mL ISOVUE-300 IOPAMIDOL (ISOVUE-300) INJECTION 61% COMPARISON:  Chest CT 03/01/2015. No prior dedicated abdominopelvic imaging. FINDINGS: Lower chest: Right middle lobe pulmonary nodule of 6 mm on image 3/4. Suspect centrilobular emphysema. Normal heart size with tiny left pleural effusion. Right coronary artery atherosclerosis. Retrocrural adenopathy at 10 mm on image 15/2. Hepatobiliary: Subtle irregular hepatic capsule may be artifactual and related to CT reconstruction technique. No focal liver lesion. Normal gallbladder, without biliary ductal dilatation. Pancreas: Normal, without mass or ductal dilatation. Spleen: Normal in size, without focal abnormality. Adrenals/Urinary Tract: Mild right adrenal thickening. Normal left adrenal gland. Too small to characterize interpolar right renal lesion is most likely a cyst. Moderate bilateral hydroureteronephrosis. This is slightly worse on the left, with marked left perinephric interstitial thickening, possibly related to forniceal rupture. Hydroureter is followed only to the upper abdomen, likely obstructed by retroperitoneal adenopathy. Bladder tumor is eccentric right, including on image 62/2. There is more inferior masslike right bladder placed tumor  including at 4.4 x 3.5 cm on image 67/2. Stomach/Bowel: Proximal gastric underdistention. Rectal wall thickening is contiguous with bladder and prostatic soft tissue fullness, including on image 71/series 2. There is a large amount of colonic stool within the more proximal colon, suggesting a component of low-grade obstruction. Normal terminal ileum. Normal small bowel. Vascular/Lymphatic: Advanced aortic and branch vessel atherosclerosis. Bulky retroperitoneal adenopathy. Left periaortic  nodal tissue surrounds the aorta and measures 5.0 x 3.6 cm on image 39/2. Marked pelvic sidewall adenopathy, including a right external iliac nodal mass of 3.9 x 5.4 cm on image 57/2. Reproductive: Despite the clinical history, the prostate appears present including on image 72/series 2. Soft tissue is positioned between the posterior right bladder wall and rectum, contiguous with the superior aspect of the prostate and right seminal vesicle. Example image 66/2. Other: Perirectal edema. No abdominopelvic ascites. Tiny fat containing left inguinal hernia. Musculoskeletal: Bilateral L5 pars defects with grade 2 L5-S1 anterolisthesis. Heterogeneous marrow density, including in the right-side of the L1 vertebral body on image 47/6. IMPRESSION: 1. 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 Infiltrative soft tissue involves the bladder, rectum, and superior aspect of the prostate. Bladder primary is slightly favored. Prostatic or rectal primary is felt less likely. Given the clinical history and appearance of the rectum, fistulous communication between bladder and rectum cannot be excluded. No gas within the bladder to confirm fistula. 2. Suspect a component of bowel obstruction at the level of rectal involvement. 3. Extensive nodal metastasis within the abdomen and retrocrural space of the lower chest. 4. Bilateral hydronephrosis, likely related to retroperitoneal adenopathy. Extensive left perirenal edema, suggesting  forniceal rupture. 5. Tiny left pleural effusion. 6. Coronary artery atherosclerosis. Aortic Atherosclerosis (ICD10-I70.0). 7. Suspect osseous metastasis, most apparent at L1. 8. Nonspecific right middle lobe pulmonary nodule. These results will be called to the ordering clinician or representative by the Radiologist Assistant, and communication documented in the PACS or zVision Dashboard. Electronically Signed   By: Abigail Miyamoto M.D.   On: 04/11/2017 16:00    ASSESSMENT & PLAN:   84 year old male patient is currently up to the hospital for difficulty with urination/weight loss-CT scan shows pelvic lesions concerning for malignancy  #Metastatic disease in the pelvis-involving bladder and rectum/prostate-status post cystoscopy and biopsy.  Discussed with Dr. Reuel Derby; positive for malignancy; awaiting further delineation.  # Acute retention of urine/status post Foley catheter; status post left-sided stenting.  Defer to urology for further follow-up.  #Anemia-recommend workup for iron deficiency.  #Discussed plan with the patient's daughter in detail.  Await pathology-and for further recommendations.  Discussed with Dr. Brigitte Pulse.  Also discussed the tumor conference.  Thank you Dr.Shah for allowing me to participate in the care of your pleasant patient. Please do not hesitate to contact me with questions or concerns in the interim.    [name], MD 04/12/2017 4:55 PM

## 2017-04-12 NOTE — Plan of Care (Signed)
  Progressing Education: Knowledge of General Education information will improve 04/12/2017 1252 - Progressing by Cherylann Parr, RN Health Behavior/Discharge Planning: Ability to manage health-related needs will improve 04/12/2017 1252 - Progressing by Cherylann Parr, RN Clinical Measurements: Ability to maintain clinical measurements within normal limits will improve 04/12/2017 1252 - Progressing by Cherylann Parr, RN Will remain free from infection 04/12/2017 1252 - Progressing by Cherylann Parr, RN Diagnostic test results will improve 04/12/2017 1252 - Progressing by Cherylann Parr, RN Respiratory complications will improve 04/12/2017 1252 - Progressing by Cherylann Parr, RN Cardiovascular complication will be avoided 04/12/2017 1252 - Progressing by Cherylann Parr, RN Activity: Risk for activity intolerance will decrease 04/12/2017 1252 - Progressing by Cherylann Parr, RN Nutrition: Adequate nutrition will be maintained 04/12/2017 1252 - Progressing by Cherylann Parr, RN Coping: Level of anxiety will decrease 04/12/2017 1252 - Progressing by Cherylann Parr, RN Elimination: Will not experience complications related to bowel motility 04/12/2017 1252 - Progressing by Cherylann Parr, RN Will not experience complications related to urinary retention 04/12/2017 1252 - Progressing by Cherylann Parr, RN Pain Managment: General experience of comfort will improve 04/12/2017 1252 - Progressing by Cherylann Parr, RN Safety: Ability to remain free from injury will improve 04/12/2017 1252 - Progressing by Cherylann Parr, RN Skin Integrity: Risk for impaired skin integrity will decrease 04/12/2017 1252 - Progressing by Cherylann Parr, RN

## 2017-04-12 NOTE — Progress Notes (Signed)
[facility] [place] NAME: Michael [name]    MR#:  [id number]  DATE OF BIRTH:  November 19, 1933  SUBJECTIVE:  CHIEF COMPLAINT:  No chief complaint on file. Patient would like to eat, feels much better, thankful for care.  Daughter at bedside REVIEW OF SYSTEMS:  Review of Systems  Constitutional: Negative for chills, fever and weight loss.  HENT: Negative for nosebleeds and sore throat.   Eyes: Negative for blurred vision.  Respiratory: Negative for cough, shortness of breath and wheezing.   Cardiovascular: Negative for chest pain, orthopnea, leg swelling and PND.  Gastrointestinal: Positive for abdominal pain. Negative for constipation, diarrhea, heartburn, nausea and vomiting.  Genitourinary: Negative for dysuria and urgency.  Musculoskeletal: Negative for back pain.  Skin: Negative for rash.  Neurological: Negative for dizziness, speech change, focal weakness and headaches.  Endo/Heme/Allergies: Does not bruise/bleed easily.  Psychiatric/Behavioral: Negative for depression.    DRUG ALLERGIES:   Allergies  Allergen Reactions  . Zosyn [Piperacillin Sod-Tazobactam So] Swelling    Lips swollen.  Was given at hospital through IV   VITALS:  Blood pressure 138/68, pulse 92, temperature 98.6 F (37 C), temperature source Oral, resp. rate 16, height 5\' 6"  (1.676 m), weight 58.6 kg (129 lb 3 oz), SpO2 98 %. PHYSICAL EXAMINATION:  Physical Exam  Constitutional: He is oriented to person, place, and time and well-developed, well-nourished, and in no distress.  HENT:  Head: Normocephalic and atraumatic.  Eyes: Conjunctivae and EOM are normal. Pupils are equal, round, and reactive to light.  Neck: Normal range of motion. Neck supple. No tracheal deviation present. No thyromegaly present.  Cardiovascular: Normal rate, regular rhythm and normal heart sounds.  Pulmonary/Chest: Effort normal and breath sounds normal. No respiratory distress. He has  no wheezes. He exhibits no tenderness.  Abdominal: Soft. Bowel sounds are normal. He exhibits no distension. There is no tenderness.  Musculoskeletal: Normal range of motion.  Neurological: He is alert and oriented to person, place, and time. No cranial nerve deficit.  Skin: Skin is warm and dry. No rash noted.  Psychiatric: Mood and affect normal.   LABORATORY PANEL:  Male CBC Recent Labs  Lab 04/12/17 0419  WBC 5.4  HGB 9.5*  HCT 28.7*  PLT 253   ------------------------------------------------------------------------------------------------------------------ Chemistries  Recent Labs  Lab 04/11/17 1210 04/12/17 0419  NA 132* 135  K 3.9 4.1  CL 98* 101  CO2 22 24  GLUCOSE 289* 193*  BUN 32* 24*  CREATININE 1.42* 1.17  CALCIUM 8.4* 8.0*  AST 37  --   ALT 19  --   ALKPHOS 107  --   BILITOT 0.5  --    RADIOLOGY:  No results found. ASSESSMENT AND PLAN:  84 y.o. male with a known history of CAD, peripheral vascular disease, diabetes, hypertension, influenza a and hyperlipidemia admitted for abdominal pain and found to have incidental advanced metastatic disease   1.  Abdominal pain likely from some underlying Malignancy with constipation.  Daughter at bedside was confused about diarrhea versus constipation.  And reports that he has not had good bowel movement.  We will order stool softener and cancel stool studies. -May be metastatic malignancy with unknown primary - PSA mildly elevated to 9.  ?prostate. may require retroperitoneal biopsy. 2.  Acute kidney injury and b/l hydronephrosis with dehydration: continue IV fluid hydration.  Hold ACE inhibitor. S/p lt stent placement. Urology considering right sided stenting as well - may be tomorrow 3.  History of CAD on aspirin and Toprol 4.  Hyperlipidemia unspecified on statin. 5.  Anemia guaiac stools send off a ferritin 6.  Essential hypertension continue beta-blocker 7.  BPH on finasteride 8.  Influenza A positive continue  Tamiflu 9.  Weakness: Due to anemia and likely underlying malignancy 10.  Type 2 diabetes mellitus.  Start sliding scale insulin and hold Amaryl 11. Urinary retention-secondary to bladder neck contracture/pelvic mass. s/p Bladder neck dilation by Urology and Foley placement.  Likely need Foley at discharge per urology       All the records are reviewed and case discussed with Care Management/Social Worker. Management plans discussed with the patient, family and they are in agreement.  CODE STATUS: Full Code  TOTAL TIME TAKING CARE OF THIS PATIENT: 35 minutes.   More than 50% of the time was spent in counseling/coordination of care: YES  POSSIBLE D/C IN 2-3 DAYS, DEPENDING ON CLINICAL CONDITION.   Max Sane M.D on 04/12/2017 at 4:49 PM  Between 7am to 6pm - Pager - [phone]  After 6pm go to [email or web address] - password [email or web address]  [facility]  Office  [phone]  CC: Primary care physician; [name], MD  Note: This dictation was prepared with Dragon dictation along with smaller phrase technology. Any transcriptional errors that result from this process are unintentional.

## 2017-04-12 NOTE — Care Management Note (Signed)
Case Management Note  Patient Details  Name: Michael [name] MRN: [id number] Date of Birth: Oct 06, 1933  Subjective/Objective:    Admitted to [facility] with the diagnosis of Abdominal Pain. Flu+. Lives with daughter, Michael [name] [phone] Last seen Dr. Humphrey Rolls 04/11/17. Prescriptions are filled at [place]. No home Health. No skilled facility. No medical equipment in the home. Self feed. Needs help with baths and dressing. Last 12 pounds in the last 2 months. No falls. Daughter will transport     Foley intact            Action/Plan: Received referral for help with medications. Daughter states that sometimes they have a problem, but they work it out. Physical therapy evaluation completed. Recommending home with home health and physical therapy.  Discussed agencies with daughter, [facility]. Will update Jermanine, [facility] representative.    Expected Discharge Date:                  Expected Discharge Plan:     In-House Referral:   yes  Discharge planning Services   yes  Post Acute Care Choice:   yes Choice offered to:   daughter  DME Arranged:    DME Agency:     HH Arranged:   yes HH Agency:   [facility]  Status of Service:     If discussed at [place] of Stay Meetings, dates discussed:    Additional Comments:  Michael Ammons, RN MSN CCM Care Management [phone] 04/12/2017, 2:54 PM

## 2017-04-12 NOTE — Progress Notes (Signed)
Inpatient Diabetes Program Recommendations  AACE/ADA: New Consensus Statement on Inpatient Glycemic Control (2015)  Target Ranges:  Prepandial:   less than 140 mg/dL      Peak postprandial:   less than 180 mg/dL (1-2 hours)      Critically ill patients:  140 - 180 mg/dL   Lab Results  Component Value Date   GLUCAP 153 (H) 04/12/2017   HGBA1C 9.3 04/05/2017    Review of Glycemic Control  Results for Michael, [name] (MRN [id number]) as of 04/12/2017 08:07  Ref. Range 04/11/2017 16:56 04/11/2017 20:07 04/11/2017 20:56 04/12/2017 07:37 04/12/2017 07:53  Glucose-Capillary Latest Ref Range: 65 - 99 mg/dL 97 94 99 163 (H) 153 (H)    Diabetes history: Type 2 Outpatient Diabetes medications: Amaryl 4mg  bid Current orders for Inpatient glycemic control: Novolog 0-9 units tid, Novolog 0-5 units qhs  Inpatient Diabetes Program Recommendations:   Agree with current medications for blood sugar management.    Gentry Fitz, RN, BA, MHA, CDE Diabetes Coordinator Inpatient Diabetes Program  [phone] (Team Pager) [phone] ([place]) 04/12/2017 8:08 AM

## 2017-04-13 ENCOUNTER — Inpatient Hospital Stay: Payer: Medicare Other

## 2017-04-13 ENCOUNTER — Encounter: Payer: Self-pay | Admitting: Radiology

## 2017-04-13 DIAGNOSIS — R339 Retention of urine, unspecified: Secondary | ICD-10-CM

## 2017-04-13 DIAGNOSIS — C785 Secondary malignant neoplasm of large intestine and rectum: Secondary | ICD-10-CM

## 2017-04-13 DIAGNOSIS — C7911 Secondary malignant neoplasm of bladder: Principal | ICD-10-CM

## 2017-04-13 DIAGNOSIS — C61 Malignant neoplasm of prostate: Secondary | ICD-10-CM

## 2017-04-13 DIAGNOSIS — R630 Anorexia: Secondary | ICD-10-CM

## 2017-04-13 DIAGNOSIS — D649 Anemia, unspecified: Secondary | ICD-10-CM

## 2017-04-13 DIAGNOSIS — G8918 Other acute postprocedural pain: Secondary | ICD-10-CM

## 2017-04-13 HISTORY — PX: IR NEPHROSTOMY PLACEMENT RIGHT: IMG6064

## 2017-04-13 LAB — BASIC METABOLIC PANEL
Anion gap: 9 (ref 5–15)
BUN: 18 mg/dL (ref 6–20)
CO2: 22 mmol/L (ref 22–32)
Calcium: 7.9 mg/dL — ABNORMAL LOW (ref 8.9–10.3)
Chloride: 104 mmol/L (ref 101–111)
Creatinine, Ser: 1 mg/dL (ref 0.61–1.24)
GFR calc Af Amer: >60 mL/min (ref >60)
GFR calc non Af Amer: >60 mL/min (ref >60)
Glucose, Bld: 139 mg/dL — ABNORMAL HIGH (ref 65–99)
Potassium: 3.5 mmol/L (ref 3.5–5.1)
Sodium: 135 mmol/L (ref 135–145)

## 2017-04-13 LAB — CULTURE, BLOOD (ROUTINE X 2)
Culture: NO GROWTH
Culture: NO GROWTH

## 2017-04-13 LAB — CBC
HCT: 28.6 % — ABNORMAL LOW (ref 40.0–52.0)
Hemoglobin: 9.3 g/dL — ABNORMAL LOW (ref 13.0–18.0)
MCH: 27.2 pg (ref 26.0–34.0)
MCHC: 32.6 g/dL (ref 32.0–36.0)
MCV: 83.3 fL (ref 80.0–100.0)
Platelets: 259 10*3/uL (ref 150–440)
RBC: 3.43 MIL/uL — ABNORMAL LOW (ref 4.40–5.90)
RDW: 14.7 % — ABNORMAL HIGH (ref 11.5–14.5)
WBC: 6 10*3/uL (ref 3.8–10.6)

## 2017-04-13 LAB — GLUCOSE, CAPILLARY
Glucose-Capillary: 134 mg/dL — ABNORMAL HIGH (ref 65–99)
Glucose-Capillary: 252 mg/dL — ABNORMAL HIGH (ref 65–99)
Glucose-Capillary: 283 mg/dL — ABNORMAL HIGH (ref 65–99)

## 2017-04-13 MED ORDER — ENOXAPARIN SODIUM 40 MG/0.4ML ~~LOC~~ SOLN
40.0000 mg | SUBCUTANEOUS | Status: DC
  Administered 2017-04-14 – 2017-04-16 (×3): 40 mg via SUBCUTANEOUS
  Filled 2017-04-13 (×3): qty 0.4

## 2017-04-13 MED ORDER — FENTANYL CITRATE (PF) 100 MCG/2ML IJ SOLN
INTRAMUSCULAR | Status: AC
  Filled 2017-04-13: qty 4

## 2017-04-13 MED ORDER — MIDAZOLAM HCL 5 MG/5ML IJ SOLN
INTRAMUSCULAR | Status: AC | PRN
  Administered 2017-04-13 (×2): 1 mg via INTRAVENOUS

## 2017-04-13 MED ORDER — IOPAMIDOL (ISOVUE-300) INJECTION 61%
30.0000 mL | Freq: Once | INTRAVENOUS | Status: AC | PRN
  Administered 2017-04-13: 15 mL via INTRAVENOUS

## 2017-04-13 MED ORDER — SODIUM CHLORIDE 0.9% FLUSH
5.0000 mL | Freq: Three times a day (TID) | INTRAVENOUS | Status: DC
  Administered 2017-04-13 – 2017-04-16 (×9): 5 mL

## 2017-04-13 MED ORDER — CIPROFLOXACIN IN D5W 400 MG/200ML IV SOLN
400.0000 mg | INTRAVENOUS | Status: AC
  Administered 2017-04-13: 400 mg via INTRAVENOUS
  Filled 2017-04-13: qty 200

## 2017-04-13 MED ORDER — MIDAZOLAM HCL 5 MG/5ML IJ SOLN
INTRAMUSCULAR | Status: AC
  Filled 2017-04-13: qty 5

## 2017-04-13 MED ORDER — LIDOCAINE HCL (PF) 1 % IJ SOLN
INTRAMUSCULAR | Status: AC | PRN
  Administered 2017-04-13: 10 mL

## 2017-04-13 MED ORDER — LISINOPRIL 5 MG PO TABS
2.5000 mg | ORAL_TABLET | Freq: Every day | ORAL | Status: DC
  Administered 2017-04-13 – 2017-04-16 (×4): 2.5 mg via ORAL
  Filled 2017-04-13 (×4): qty 1

## 2017-04-13 MED ORDER — LIDOCAINE HCL (PF) 1 % IJ SOLN
INTRAMUSCULAR | Status: AC
  Filled 2017-04-13: qty 30

## 2017-04-13 MED ORDER — FENTANYL CITRATE (PF) 100 MCG/2ML IJ SOLN
INTRAMUSCULAR | Status: AC | PRN
  Administered 2017-04-13: 50 ug via INTRAVENOUS
  Administered 2017-04-13: 25 ug via INTRAVENOUS

## 2017-04-13 NOTE — Care Management (Addendum)
Spoke with daughter, Gae Bon. States that she would like hospital and rolling walker. Jermaine updated. Personal care service list given to daughter. Stent placed today. Possible discharge tomorrow. Shelbie Ammons RN MSN CCM Care Management [phone]

## 2017-04-13 NOTE — Care Management Important Message (Signed)
Important Message  Patient Details  Name: Michael [name] MRN: [id number] Date of Birth: 01-30-1934   Medicare Important Message Given:  Yes    Shelbie Ammons, RN 04/13/2017, 7:30 AM

## 2017-04-13 NOTE — Progress Notes (Signed)
[name] at [place] NAME: Michael [name]    MR#:  [id number]  DATE OF BIRTH:  05-08-1933  SUBJECTIVE:  CHIEF COMPLAINT:  No chief complaint on file. Patient is nothing by mouth again today for nephrostomy to placement on right side.  Daughter at bedside. REVIEW OF SYSTEMS:  Review of Systems  Constitutional: Negative for chills, fever and weight loss.  HENT: Negative for nosebleeds and sore throat.   Eyes: Negative for blurred vision.  Respiratory: Negative for cough, shortness of breath and wheezing.   Cardiovascular: Negative for chest pain, orthopnea, leg swelling and PND.  Gastrointestinal: Positive for abdominal pain. Negative for constipation, diarrhea, heartburn, nausea and vomiting.  Genitourinary: Negative for dysuria and urgency.  Musculoskeletal: Negative for back pain.  Skin: Negative for rash.  Neurological: Negative for dizziness, speech change, focal weakness and headaches.  Endo/Heme/Allergies: Does not bruise/bleed easily.  Psychiatric/Behavioral: Negative for depression.    DRUG ALLERGIES:   Allergies  Allergen Reactions  . Zosyn [Piperacillin Sod-Tazobactam So] Swelling    Lips swollen.  Was given at hospital through IV   VITALS:  Blood pressure 134/68, pulse 89, temperature 98 F (36.7 C), temperature source Oral, resp. rate (!) 22, height 5\' 6"  (1.676 m), weight 58.5 kg (129 lb), SpO2 97 %. PHYSICAL EXAMINATION:  Physical Exam  Constitutional: He is oriented to person, place, and time and well-developed, well-nourished, and in no distress.  HENT:  Head: Normocephalic and atraumatic.  Eyes: Conjunctivae and EOM are normal. Pupils are equal, round, and reactive to light.  Neck: Normal range of motion. Neck supple. No tracheal deviation present. No thyromegaly present.  Cardiovascular: Normal rate, regular rhythm and normal heart sounds.  Pulmonary/Chest: Effort normal and breath sounds normal. No  respiratory distress. He has no wheezes. He exhibits no tenderness.  Abdominal: Soft. Bowel sounds are normal. He exhibits no distension. There is no tenderness.  Musculoskeletal: Normal range of motion.  Neurological: He is alert and oriented to person, place, and time. No cranial nerve deficit.  Generalized weakness.  Skin: Skin is warm and dry. No rash noted.  Psychiatric: Mood and affect normal.   LABORATORY PANEL:  Male CBC Recent Labs  Lab 04/13/17 0403  WBC 6.0  HGB 9.3*  HCT 28.6*  PLT 259   ------------------------------------------------------------------------------------------------------------------ Chemistries  Recent Labs  Lab 04/11/17 1210  04/13/17 0403  NA 132*   < > 135  K 3.9   < > 3.5  CL 98*   < > 104  CO2 22   < > 22  GLUCOSE 289*   < > 139*  BUN 32*   < > 18  CREATININE 1.42*   < > 1.00  CALCIUM 8.4*   < > 7.9*  AST 37  --   --   ALT 19  --   --   ALKPHOS 107  --   --   BILITOT 0.5  --   --    < > = values in this interval not displayed.   RADIOLOGY:  [place] Intraoperative  Result Date: 04/13/2017 CLINICAL DATA:  Ultrasound was provided for use by the ordering physician, and a technical charge was applied by the performing facility.  No radiologist interpretation/professional services rendered.   US Renal  Result Date: 04/12/2017 CLINICAL DATA:  History of metastatic disease from primary pelvic lesion, hydronephrosis EXAM: RENAL / URINARY TRACT ULTRASOUND COMPLETE COMPARISON:  CT abdomen pelvis of 04/19/2017 FINDINGS: Right Kidney: Length: 10.0 cm.  Moderate hydronephrosis is present. The renal parenchyma echogenicity is unremarkable. Left Kidney: Length: 10.1 cm. No hydronephrosis is currently seen. The echogenicity of the renal parenchyma is within normal limits. Bladder: The urinary bladder is decompressed with Foley catheter present. Urinary bladder wall does appear to be diffusely thickened. IMPRESSION: 1. Right-sided moderate hydronephrosis.  No  left hydronephrosis. 2. The urinary bladder is decompressed but does appear thick walled. Electronically Signed   By: Ivar Drape M.D.   On: 04/12/2017 17:13   Ir Nephrostomy Placement Right  Result Date: 04/13/2017 INDICATION: History of metastatic prostate cancer, now with bilateral obstructive uropathy. Patient was able to undergo successful left-sided retrograde ureteral stent placement however failed attempted right-sided retrograde stent placement secondary to nonvisualization of the right urinary bladder trigone. As such, request made for image guided placement of a right-sided percutaneous nephrostomy catheter. EXAM: 1. ULTRASOUND GUIDANCE FOR PUNCTURE OF THE RIGHT RENAL COLLECTING SYSTEM 2. RIGHT PERCUTANEOUS NEPHROSTOMY TUBE PLACEMENT. COMPARISON:  CT abdomen pelvis - 04/11/2017; renal ultrasound - 04/12/2017 MEDICATIONS: Ciprofloxacin 400 mg IV; the antibiotic was administered in an appropriate time frame prior to skin puncture. ANESTHESIA/SEDATION: Moderate (conscious) sedation was employed during this procedure. A total of Versed 2 mg and Fentanyl 75 mcg was administered intravenously. Moderate Sedation Time: 16 minutes. The patient's level of consciousness and vital signs were monitored continuously by radiology nursing throughout the procedure under my direct supervision. CONTRAST:  20 mL Isovue 300 administered into the collecting system FLUOROSCOPY TIME:  1 minute 36 seconds (371 mGy) COMPLICATIONS: None immediate. PROCEDURE: The procedure, risks, benefits, and alternatives were explained to the patient. Questions regarding the procedure were encouraged and answered. The patient understands and consents to the procedure. A timeout was performed prior to the initiation of the procedure. The right flank region was prepped with Betadine in a sterile fashion, and a sterile drape was applied covering the operative field. A sterile gown and sterile gloves were used for the procedure. Local anesthesia  was provided with 1% Lidocaine with epinephrine. Ultrasound was used to localize the right kidney. Under direct ultrasound guidance, a 21 gauge needle was advanced into the renal collecting system. An ultrasound image documentation was performed. Access within the collecting system was confirmed with the efflux of urine followed by contrast injection. Over a Nitrex wire, the inner three [place] catheter of an Accustick set was advanced into the renal collecting system. Contrast injection was injected into the collecting system as several spot radiographs were obtained in various obliquities confirming puncture within a posterior inferior calix. As such, the tract was dilated with an Accustick stent. Over a guide wire, a 10-French percutaneous nephrostomy catheter was advanced into the collecting system where the coil was formed and locked. Contrast was injected and several sport radiographs were obtained in various obliquities confirming access. The catheter was secured at the skin with a Prolene retention suture and a gravity bag was placed. A dressing was placed. The patient tolerated procedure well without immediate postprocedural complication. FINDINGS: Ultrasound scanning demonstrates a moderate dilated right collecting system. Under direct ultrasound guidance, a posterior inferior calix was targeted allowing advancement of an 10-French percutaneous nephrostomy catheter under intermittent fluoroscopic guidance. Contrast injection confirmed appropriate positioning. IMPRESSION: Successful ultrasound and fluoroscopic guided placement of a right sided 10 French PCN. Electronically Signed   By: Sandi Mariscal M.D.   On: 04/13/2017 13:33   ASSESSMENT AND PLAN:  84 y.o. male with a known history of CAD, peripheral vascular disease, diabetes, hypertension, influenza a and  hyperlipidemia admitted for abdominal pain and found to have incidental advanced metastatic disease   1.  Abdominal pain likely from some underlying  Malignancy with constipation.   -May be metastatic malignancy with unknown primary - PSA mildly elevated to 9.  ? - Likely prostate as per urology. 2.  Acute kidney injury and b/l hydronephrosis with dehydration: continue IV fluid hydration.  Hold ACE inhibitor. S/p lt stent placement. Urology tried right sided stenting as well - but could not place it, so suggested IR guided Nephrostomy tube placement.   Renal func much improved. 3.  History of CAD on aspirin and Toprol 4.  Hyperlipidemia unspecified on statin. 5.  Anemia guaiac negative stools, high ferritin 6.  Essential hypertension continue beta-blocker 7.  BPH on finasteride 8.  Influenza A positive continue Tamiflu- finishing 5 days now. 9.  Weakness: Due to anemia and likely underlying malignancy- PT suggest HHA PT. 10.  Type 2 diabetes mellitus.  Start sliding scale insulin and hold Amaryl 11. Urinary retention-secondary to bladder neck contracture/pelvic mass. s/p Bladder neck dilation by Urology and Foley placement.  Likely need Foley at discharge per urology    All the records are reviewed and case discussed with Care Management/Social Worker. Management plans discussed with the patient, family and they are in agreement.  CODE STATUS: Full Code  TOTAL TIME TAKING CARE OF THIS PATIENT: 35 minutes.   More than 50% of the time was spent in counseling/coordination of care: YES  POSSIBLE D/C IN 2-3 DAYS, DEPENDING ON CLINICAL CONDITION.   Vaughan Basta M.D on 04/13/2017 at 3:33 PM  Between 7am to 6pm - Pager - [phone]  After 6pm go to [email or web address] - password [email or web address]  [facility]  Office  [phone]  CC: Primary care physician; [name], MD  Note: This dictation was prepared with Dragon dictation along with smaller phrase technology. Any transcriptional errors that result from this process are unintentional.

## 2017-04-13 NOTE — Progress Notes (Signed)
Inpatient Diabetes Program Recommendations  AACE/ADA: New Consensus Statement on Inpatient Glycemic Control (2015)  Target Ranges:  Prepandial:   less than 140 mg/dL      Peak postprandial:   less than 180 mg/dL (1-2 hours)      Critically ill patients:  140 - 180 mg/dL   Lab Results  Component Value Date   GLUCAP 134 (H) 04/13/2017   HGBA1C 9.3 04/05/2017    Review of Glycemic Control Results for Michael [name], Michael [name] (MRN [id number]) as of 04/13/2017 11:22  Ref. Range 04/12/2017 07:53 04/12/2017 12:09 04/12/2017 17:05 04/12/2017 21:56 04/13/2017 08:29  Glucose-Capillary Latest Ref Range: 65 - 99 mg/dL 153 (H) 209 (H) 204 (H) 147 (H) 134 (H)    Diabetes history: Type 2 Outpatient Diabetes medications: Amaryl 4mg  bid Current orders for Inpatient glycemic control: Novolog 0-9 units tid, Novolog 0-5 units qhs  Inpatient Diabetes Program Recommendations:   Agree with current medications for blood sugar management.   Gentry Fitz, RN, BA, MHA, CDE Diabetes Coordinator Inpatient Diabetes Program  [phone] (Team Pager) [phone] ([place]) 04/13/2017 11:24 AM

## 2017-04-13 NOTE — Progress Notes (Signed)
Michael [name]   DOB:1933/09/24   GY#:[id number]    Subjective: Patient is resting the bed.  He has just returned from nephrostomy procedure.  Complains of mild pain at the procedure site.  Poor appetite.  No nausea.  No vomiting.  Positive for constipation.  Complains of pinkish tinged urine in the Foley bag.  ROS: No chest pain.  No headaches.  Complete 10 point review system is done which is negative except mentioned above.  Objective:  Vitals:   04/13/17 1637 04/13/17 2135  BP: (!) 146/62 (!) 157/78  Pulse: 99 (!) 102  Resp: (!) 24 20  Temp: 97.8 F (36.6 C) 98 F (36.7 C)  SpO2: 91% 93%     Intake/Output Summary (Last 24 hours) at 04/14/2017 0025 Last data filed at 04/13/2017 2150 Gross per 24 hour  Intake --  Output 3775 ml  Net -3775 ml    GENERAL Alert, no distress and comfortable.  EYES: no pallor or icterus OROPHARYNX: no thrush or ulceration. NECK: supple, no masses felt LYMPH:  no palpable lymphadenopathy in the cervical, axillary or inguinal regions LUNGS: decreased breath sounds to auscultation at bases and  No wheeze or crackles HEART/CVS: regular rate & rhythm and no murmurs; No lower extremity edema ABDOMEN: abdomen soft, tender  on deep palpation. and normal bowel sounds; Foley catheter draining pinkish urine.  Positive for nephrostomy bag Musculoskeletal:no cyanosis of digits and no clubbing  PSYCH: alert & oriented x 3 with fluent speech NEURO: no focal motor/sensory deficits SKIN:  no rashes or significant lesions   Labs:  Lab Results  Component Value Date   WBC 6.0 04/13/2017   HGB 9.3 (L) 04/13/2017   HCT 28.6 (L) 04/13/2017   MCV 83.3 04/13/2017   PLT 259 04/13/2017    Lab Results  Component Value Date   NA 135 04/13/2017   K 3.5 04/13/2017   CL 104 04/13/2017   CO2 22 04/13/2017    Studies:  [place] Intraoperative  Result Date: 04/13/2017 CLINICAL DATA:  Ultrasound was provided for use by the ordering physician, and a technical charge  was applied by the performing facility.  No radiologist interpretation/professional services rendered.   US Renal  Result Date: 04/12/2017 CLINICAL DATA:  History of metastatic disease from primary pelvic lesion, hydronephrosis EXAM: RENAL / URINARY TRACT ULTRASOUND COMPLETE COMPARISON:  CT abdomen pelvis of 04/19/2017 FINDINGS: Right Kidney: Length: 10.0 cm. Moderate hydronephrosis is present. The renal parenchyma echogenicity is unremarkable. Left Kidney: Length: 10.1 cm. No hydronephrosis is currently seen. The echogenicity of the renal parenchyma is within normal limits. Bladder: The urinary bladder is decompressed with Foley catheter present. Urinary bladder wall does appear to be diffusely thickened. IMPRESSION: 1. Right-sided moderate hydronephrosis.  No left hydronephrosis. 2. The urinary bladder is decompressed but does appear thick walled. Electronically Signed   By: Ivar Drape M.D.   On: 04/12/2017 17:13   Ir Nephrostomy Placement Right  Result Date: 04/13/2017 INDICATION: History of metastatic prostate cancer, now with bilateral obstructive uropathy. Patient was able to undergo successful left-sided retrograde ureteral stent placement however failed attempted right-sided retrograde stent placement secondary to nonvisualization of the right urinary bladder trigone. As such, request made for image guided placement of a right-sided percutaneous nephrostomy catheter. EXAM: 1. ULTRASOUND GUIDANCE FOR PUNCTURE OF THE RIGHT RENAL COLLECTING SYSTEM 2. RIGHT PERCUTANEOUS NEPHROSTOMY TUBE PLACEMENT. COMPARISON:  CT abdomen pelvis - 04/11/2017; renal ultrasound - 04/12/2017 MEDICATIONS: Ciprofloxacin 400 mg IV; the antibiotic was administered in an appropriate time  frame prior to skin puncture. ANESTHESIA/SEDATION: Moderate (conscious) sedation was employed during this procedure. A total of Versed 2 mg and Fentanyl 75 mcg was administered intravenously. Moderate Sedation Time: 16 minutes. The patient's  level of consciousness and vital signs were monitored continuously by radiology nursing throughout the procedure under my direct supervision. CONTRAST:  20 mL Isovue 300 administered into the collecting system FLUOROSCOPY TIME:  1 minute 36 seconds (270 mGy) COMPLICATIONS: None immediate. PROCEDURE: The procedure, risks, benefits, and alternatives were explained to the patient. Questions regarding the procedure were encouraged and answered. The patient understands and consents to the procedure. A timeout was performed prior to the initiation of the procedure. The right flank region was prepped with Betadine in a sterile fashion, and a sterile drape was applied covering the operative field. A sterile gown and sterile gloves were used for the procedure. Local anesthesia was provided with 1% Lidocaine with epinephrine. Ultrasound was used to localize the right kidney. Under direct ultrasound guidance, a 21 gauge needle was advanced into the renal collecting system. An ultrasound image documentation was performed. Access within the collecting system was confirmed with the efflux of urine followed by contrast injection. Over a Nitrex wire, the inner three [place] catheter of an Accustick set was advanced into the renal collecting system. Contrast injection was injected into the collecting system as several spot radiographs were obtained in various obliquities confirming puncture within a posterior inferior calix. As such, the tract was dilated with an Accustick stent. Over a guide wire, a 10-French percutaneous nephrostomy catheter was advanced into the collecting system where the coil was formed and locked. Contrast was injected and several sport radiographs were obtained in various obliquities confirming access. The catheter was secured at the skin with a Prolene retention suture and a gravity bag was placed. A dressing was placed. The patient tolerated procedure well without immediate postprocedural complication.  FINDINGS: Ultrasound scanning demonstrates a moderate dilated right collecting system. Under direct ultrasound guidance, a posterior inferior calix was targeted allowing advancement of an 10-French percutaneous nephrostomy catheter under intermittent fluoroscopic guidance. Contrast injection confirmed appropriate positioning. IMPRESSION: Successful ultrasound and fluoroscopic guided placement of a right sided 10 French PCN. Electronically Signed   By: Sandi Mariscal M.D.   On: 04/13/2017 13:33    Assessment & Plan:  84 year old male patient is currently up to the hospital for difficulty with urination/weight loss-CT scan shows pelvic lesions concerning for malignancy  # Metastatic disease in the pelvis-involving bladder and rectum/prostate-status post cystoscopy and biopsy.  Discussed with Dr. Reuel Derby; positive for malignancy-awaiting IHC.  # Acute retention of urine/status post Foley catheter; status post left-sided stenting; status post right-sided nephrostomy.  #Anemia-likely secondary to anemia of chronic disease.  Addendum: Received the pathology results from Dr. Alphonzo Severance report]positive for metastatic prostate cancer.  I had a long discussion with the patient's daughter regarding the incurable nature of the disease.  However, this could be well treated with anti-hormonal therapy.  I would recommend degarelix loading dose [the hospital] followed by maintenance/Lupron [outpatient].  Also discussed the role of chemotherapy or Zytiga-the context of hormone sensitive prostate cancer.  Given the bulk of the disease; and symptoms-I feel the patient would benefit from systemic chemotherapy with docetaxel.  However, no decisions made at this time. Pts daughter agrees to proceed with Degarelix in hospital.  . Dr.Yu available over the weekend; degarelix subcu would need to be started prior to discharge.  Patient could follow-up with me in the clinic.  # 40  minutes face-to-face with the  patient/family discussing the above plan of care; more than 50% of time spent on prognosis/ natural history; counseling and coordination.   [name], MD

## 2017-04-13 NOTE — Progress Notes (Signed)
Urology Consult Follow Up  Subjective: Per Dr. Reuel Derby, bladder biopsy with viable tissue.  Differential diagnosis prostate cancer versus lymphoma with stains pending.  Persistent moderate right hydronephrosis with normalized creatinine.    Anti-infectives: Anti-infectives (From admission, onward)   Start     Dose/Rate Route Frequency Ordered Stop   04/11/17 1330  oseltamivir (TAMIFLU) capsule 75 mg     75 mg Oral 2 times daily 04/11/17 1304 04/16/17 0959      Current Facility-Administered Medications  Medication Dose Route Frequency Provider Last Rate Last Dose  . 0.9 %  sodium chloride infusion   Intravenous Continuous Loletha Grayer, MD 75 mL/hr at 04/12/17 2057    . 0.9 %  sodium chloride infusion   Intravenous Continuous Markus Daft, MD      . acetaminophen (TYLENOL) tablet 650 mg  650 mg Oral Q6H PRN Wieting, Richard, MD       Or  . acetaminophen (TYLENOL) suppository 650 mg  650 mg Rectal Q6H PRN Wieting, Richard, MD      . atorvastatin (LIPITOR) tablet 10 mg  10 mg Oral Daily Loletha Grayer, MD   10 mg at 04/12/17 0751  . finasteride (PROSCAR) tablet 5 mg  5 mg Oral Daily Loletha Grayer, MD   5 mg at 04/12/17 0751  . guaiFENesin-dextromethorphan (ROBITUSSIN DM) 100-10 MG/5ML syrup 5 mL  5 mL Oral Q4H PRN Max Sane, MD   5 mL at 04/12/17 2353  . insulin aspart (novoLOG) injection 0-5 Units  0-5 Units Subcutaneous QHS Wieting, Richard, MD      . insulin aspart (novoLOG) injection 0-9 Units  0-9 Units Subcutaneous TID WC Loletha Grayer, MD   1 Units at 04/13/17 0840  . loratadine (CLARITIN) tablet 10 mg  10 mg Oral Daily Loletha Grayer, MD   10 mg at 04/12/17 0751  . metoprolol succinate (TOPROL-XL) 24 hr tablet 25 mg  25 mg Oral Daily Loletha Grayer, MD   25 mg at 04/12/17 0751  . morphine 2 MG/ML injection 2 mg  2 mg Intravenous Q4H PRN Loletha Grayer, MD      . multivitamin with minerals tablet 1 tablet  1 tablet Oral Daily Loletha Grayer, MD   1 tablet at  04/12/17 0751  . ondansetron (ZOFRAN) tablet 4 mg  4 mg Oral Q6H PRN Loletha Grayer, MD       Or  . ondansetron [facility]) injection 4 mg  4 mg Intravenous Q6H PRN Wieting, Richard, MD      . oseltamivir (TAMIFLU) capsule 75 mg  75 mg Oral BID Loletha Grayer, MD   75 mg at 04/12/17 2237  . oxyCODONE (Oxy IR/ROXICODONE) immediate release tablet 5 mg  5 mg Oral Q4H PRN Wieting, Richard, MD      . pantoprazole (PROTONIX) EC tablet 40 mg  40 mg Oral Daily Loletha Grayer, MD   40 mg at 04/12/17 0751  . senna-docusate (Senokot-S) tablet 2 tablet  2 tablet Oral BID Max Sane, MD         Objective: Vital signs in last 24 hours: Temp:  [97.5 F (36.4 C)-98.6 F (37 C)] 98 F (36.7 C) (02/22 0354) Pulse Rate:  [92-98] 93 (02/22 0354) Resp:  [16] 16 (02/22 0354) BP: (138-151)/(68-82) 151/82 (02/22 0354) SpO2:  [95 %-100 %] 95 % (02/22 0354)  Intake/Output from previous day: 02/21 0701 - 02/22 0700 In: -  Out: 6400 [Urine:6400] Intake/Output this shift: No intake/output data recorded.   Physical Exam No acute distress.  Daughter at  bedside. Abdomen soft, nontender, bladder nonpalpable Foley catheter in place draining light pink urine, no clots  Lab Results:  Recent Labs    04/12/17 0419 04/13/17 0403  WBC 5.4 6.0  HGB 9.5* 9.3*  HCT 28.7* 28.6*  PLT 253 259   BMET Recent Labs    04/12/17 0419 04/13/17 0403  NA 135 135  K 4.1 3.5  CL 101 104  CO2 24 22  GLUCOSE 193* 139*  BUN 24* 18  CREATININE 1.17 1.00  CALCIUM 8.0* 7.9*   PT/INR Recent Labs    04/11/17 1719 04/12/17 1139  LABPROT 14.3 15.0  INR 1.12 1.19   ABG No results for input(s): PHART, HCO3 in the last 72 hours.  Invalid input(s): PCO2, PO2  Studies/Results: Ct Abdomen Pelvis W Contrast  Result Date: 04/11/2017 CLINICAL DATA:  Abdominal pain with radiation to his back. Symptoms for 6 weeks. Dysuria. Chronic diarrhea. Recently diagnosed with flu. Anemic. Prior prostatectomy per report. EXAM:  CT ABDOMEN AND PELVIS WITH CONTRAST TECHNIQUE: Multidetector CT imaging of the abdomen and pelvis was performed using the standard protocol following bolus administration of intravenous contrast. CONTRAST:  57mL ISOVUE-300 IOPAMIDOL (ISOVUE-300) INJECTION 61% COMPARISON:  Chest CT 03/01/2015. No prior dedicated abdominopelvic imaging. FINDINGS: Lower chest: Right middle lobe pulmonary nodule of 6 mm on image 3/4. Suspect centrilobular emphysema. Normal heart size with tiny left pleural effusion. Right coronary artery atherosclerosis. Retrocrural adenopathy at 10 mm on image 15/2. Hepatobiliary: Subtle irregular hepatic capsule may be artifactual and related to CT reconstruction technique. No focal liver lesion. Normal gallbladder, without biliary ductal dilatation. Pancreas: Normal, without mass or ductal dilatation. Spleen: Normal in size, without focal abnormality. Adrenals/Urinary Tract: Mild right adrenal thickening. Normal left adrenal gland. Too small to characterize interpolar right renal lesion is most likely a cyst. Moderate bilateral hydroureteronephrosis. This is slightly worse on the left, with marked left perinephric interstitial thickening, possibly related to forniceal rupture. Hydroureter is followed only to the upper abdomen, likely obstructed by retroperitoneal adenopathy. Bladder tumor is eccentric right, including on image 62/2. There is more inferior masslike right bladder placed tumor including at 4.4 x 3.5 cm on image 67/2. Stomach/Bowel: Proximal gastric underdistention. Rectal wall thickening is contiguous with bladder and prostatic soft tissue fullness, including on image 71/series 2. There is a large amount of colonic stool within the more proximal colon, suggesting a component of low-grade obstruction. Normal terminal ileum. Normal small bowel. Vascular/Lymphatic: Advanced aortic and branch vessel atherosclerosis. Bulky retroperitoneal adenopathy. Left periaortic nodal tissue surrounds  the aorta and measures 5.0 x 3.6 cm on image 39/2. Marked pelvic sidewall adenopathy, including a right external iliac nodal mass of 3.9 x 5.4 cm on image 57/2. Reproductive: Despite the clinical history, the prostate appears present including on image 72/series 2. Soft tissue is positioned between the posterior right bladder wall and rectum, contiguous with the superior aspect of the prostate and right seminal vesicle. Example image 66/2. Other: Perirectal edema. No abdominopelvic ascites. Tiny fat containing left inguinal hernia. Musculoskeletal: Bilateral L5 pars defects with grade 2 L5-S1 anterolisthesis. Heterogeneous marrow density, including in the right-side of the L1 vertebral body on image 47/6. IMPRESSION: 1. 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 Infiltrative soft tissue involves the bladder, rectum, and superior aspect of the prostate. Bladder primary is slightly favored. Prostatic or rectal primary is felt less likely. Given the clinical history and appearance of the rectum, fistulous communication between bladder and rectum cannot be excluded. No gas within the bladder to  confirm fistula. 2. Suspect a component of bowel obstruction at the level of rectal involvement. 3. Extensive nodal metastasis within the abdomen and retrocrural space of the lower chest. 4. Bilateral hydronephrosis, likely related to retroperitoneal adenopathy. Extensive left perirenal edema, suggesting forniceal rupture. 5. Tiny left pleural effusion. 6. Coronary artery atherosclerosis. Aortic Atherosclerosis (ICD10-I70.0). 7. Suspect osseous metastasis, most apparent at L1. 8. Nonspecific right middle lobe pulmonary nodule. These results will be called to the ordering clinician or representative by the Radiologist Assistant, and communication documented in the PACS or zVision Dashboard. Electronically Signed   By: Abigail Miyamoto M.D.   On: 04/11/2017 16:00   US Renal  Result Date: 04/12/2017 CLINICAL DATA:  History  of metastatic disease from primary pelvic lesion, hydronephrosis EXAM: RENAL / URINARY TRACT ULTRASOUND COMPLETE COMPARISON:  CT abdomen pelvis of 04/19/2017 FINDINGS: Right Kidney: Length: 10.0 cm. Moderate hydronephrosis is present. The renal parenchyma echogenicity is unremarkable. Left Kidney: Length: 10.1 cm. No hydronephrosis is currently seen. The echogenicity of the renal parenchyma is within normal limits. Bladder: The urinary bladder is decompressed with Foley catheter present. Urinary bladder wall does appear to be diffusely thickened. IMPRESSION: 1. Right-sided moderate hydronephrosis.  No left hydronephrosis. 2. The urinary bladder is decompressed but does appear thick walled. Electronically Signed   By: Ivar Drape M.D.   On: 04/12/2017 17:13   Impression/Plan: 84 year old male with failure to thrive, influenza A presenting with abdominal pain found to have incidental advanced metastatic disease as described above likely pelvic in origin, pathology pending.    1. Urinary retention-secondary to bladder neck contracture/pelvic mass.  Bladder neck dilated and Foley catheter placed in the operating room.  Maintain Foley.  Flush as needed.  2. Bilateral hydronephrosis, acute kidney injury-creatinine improving following left-sided stent placement and bladder decompression.  Unable to identify right ureteral orifice likely secondary to obstructing tumor.  Persistent moderate right hydronephrosis.  Discussed options at length today with daughter and patient including proceeding with right PCN and later internalization to double-J in order to preserve parenchyma versus wait for pathology and possible response to therapy.  They are more interested in proceeding with nephrostomy tube at this point time which seems reasonable.  3.  Locally advanced metastatic malignancy of unknown primary-PSA mildly elevated to 9.  Rectal exam was grossly abnormal with firm nodular prostate as well as a mass extending  for the prostate.  Cystoscopy reveals primarily submucosal mass with mass-effect into the bladder other than necrotic material at the bladder neck which was biopsied.  The findings favor prostate cancer.  No indication for retroperitoneal biopsy at this point in time.  Follow-up final pathology.    LOS: 2 days    [name] 04/13/2017

## 2017-04-13 NOTE — Care Management (Signed)
Mr. Seoane has radiating pain to the back which requires the upper body to be positioned in ways not feasible with a normal bed. Head must be elevated at least 30 degrees or more to help  control pain. Shelbie Ammons RN MSN CCM Care Management [phone]

## 2017-04-13 NOTE — Progress Notes (Signed)
Chief Complaint: Patient was seen in consultation today for right PCN placement at the request of [name] MD  Referring Physician(s): Dr. [name]  Supervising Physician: Sandi Mariscal  Patient Status: [name] - In-pt  History of Present Illness: Michael [name] is a 84 y.o. male who was admitted with Influenza but also workup of his back and abdominal discomfort has found evidence of widespread metastatic process including bilateral hydronephrosis secondary to ureteral obstruction. He went for cysto with successful (L) ureteral stent placement. The right ureteral orifice could not be seen. Biopsies were taken at that time as well, pathology pending. His repeat Renal US today shows continued right hydronephrosis. His renal function has normalized though. IR is asked to place (R)PCN for decompression. Pt seen with daughter present who provided interpreting service. Has been NPO this am. Chart, meds, labs, imaging reviewed.  Past Medical History:  Diagnosis Date  . Benign prostatic hypertrophy   . Bilateral carotid artery disease (HCC)    Mild plaque formation without obstructive disease noted on carotid Doppler.  . Coronary artery disease    Previous cardiac catheterization at [facility] in 2010. The patient was told about 2 blockages which did not require revascularization.  . Diabetes mellitus without complication ([place])   . Essential hypertension   . GERD (gastroesophageal reflux disease)   . Hyperlipidemia   . Hypertension   . Left carotid artery stenosis     Past Surgical History:  Procedure Laterality Date  . CARDIAC CATHETERIZATION  2010  . CARDIAC CATHETERIZATION N/A 02/03/2015   Procedure: Left Heart Cath and Coronary Angiography;  Surgeon: Wellington [name], MD;  Location: [place] [facility];  Service: Cardiovascular;  Laterality: N/A;  . CATARACT EXTRACTION    . CYSTOSCOPY W/ RETROGRADES Bilateral 04/11/2017   Procedure: CYSTOSCOPY WITH RETROGRADE  PYELOGRAM;  Surgeon: [name], MD;  Location: [facility];  Service: Urology;  Laterality: Bilateral;  . CYSTOSCOPY WITH STENT PLACEMENT Left 04/11/2017   Procedure: CYSTOSCOPY WITH STENT PLACEMENT and fulgeration;  Surgeon: [name], MD;  Location: [facility];  Service: Urology;  Laterality: Left;  . CYSTOSCOPY WITH URETHRAL DILATATION Bilateral 04/11/2017   Procedure: CYSTOSCOPY WITH URETHRAL DILATATION;  Surgeon: [name], MD;  Location: [facility];  Service: Urology;  Laterality: Bilateral;  . PROSTATECTOMY      Allergies: Zosyn [piperacillin sod-tazobactam so]  Medications: Scheduled Meds: . atorvastatin  10 mg Oral Daily  . finasteride  5 mg Oral Daily  . insulin aspart  0-5 Units Subcutaneous QHS  . insulin aspart  0-9 Units Subcutaneous TID WC  . loratadine  10 mg Oral Daily  . metoprolol succinate  25 mg Oral Daily  . multivitamin with minerals  1 tablet Oral Daily  . oseltamivir  75 mg Oral BID  . pantoprazole  40 mg Oral Daily  . senna-docusate  2 tablet Oral BID   Continuous Infusions: . sodium chloride 75 mL/hr at 04/12/17 2057  . sodium chloride     PRN Meds:.acetaminophen **OR** acetaminophen, guaiFENesin-dextromethorphan, morphine injection, ondansetron **OR** ondansetron (ZOFRAN) IV, oxyCODONE c   Family History  Problem Relation Age of Onset  . Hypertension Father     Social History   Socioeconomic History  . Marital status: Widowed    Spouse name: None  . Number of children: None  . Years of education: None  . Highest education level: None  Social Needs  . Financial resource strain: None  . Food insecurity - worry: None  . Food insecurity -  inability: None  . Transportation needs - medical: None  . Transportation needs - non-medical: None  Occupational History  . None  Tobacco Use  . Smoking status: Never Smoker  . Smokeless tobacco: Never Used  Substance and Sexual Activity  . Alcohol use: No  . Drug use: No  . Sexual activity:  None  Other Topics Concern  . None  Social History Narrative  . None    Review of Systems: A 12 point ROS discussed and pertinent positives are indicated in the HPI above.  All other systems are negative.  Review of Systems  Vital Signs: BP (!) 151/82 (BP Location: Right Arm)   Pulse 93   Temp 98 F (36.7 C) (Oral)   Resp 16   Ht 5\' 6"  (1.676 m)   Wt 129 lb 3 oz (58.6 kg)   SpO2 95%   BMI 20.85 kg/m   Physical Exam  Constitutional: He is oriented to person, place, and time. He appears well-developed. No distress.  HENT:  Head: Normocephalic.  Mouth/Throat: Oropharynx is clear and moist.  Neck: Normal range of motion.  Cardiovascular: Normal rate, regular rhythm and normal heart sounds.  Pulmonary/Chest: Effort normal and breath sounds normal. No respiratory distress.  Abdominal: Soft. He exhibits no mass. There is no tenderness.  Neurological: He is alert and oriented to person, place, and time.  Skin: Skin is warm and dry.  Psychiatric: He has a normal mood and affect.     Imaging: Dg Chest 2 View  Result Date: 04/08/2017 CLINICAL DATA:  Left flank pain radiates to bladder. EXAM: CHEST  2 VIEW COMPARISON:  12/14/2016 FINDINGS: The lungs are clear without focal pneumonia, edema, pneumothorax or pleural effusion. The cardiopericardial silhouette is within normal limits for size. The visualized bony structures of the thorax are intact. Telemetry leads overlie the chest. IMPRESSION: No active cardiopulmonary disease. Electronically Signed   By: Misty Stanley M.D.   On: 04/08/2017 10:57   Ct Abdomen Pelvis W Contrast  Result Date: 04/11/2017 CLINICAL DATA:  Abdominal pain with radiation to his back. Symptoms for 6 weeks. Dysuria. Chronic diarrhea. Recently diagnosed with flu. Anemic. Prior prostatectomy per report. EXAM: CT ABDOMEN AND PELVIS WITH CONTRAST TECHNIQUE: Multidetector CT imaging of the abdomen and pelvis was performed using the standard protocol following bolus  administration of intravenous contrast. CONTRAST:  78mL ISOVUE-300 IOPAMIDOL (ISOVUE-300) INJECTION 61% COMPARISON:  Chest CT 03/01/2015. No prior dedicated abdominopelvic imaging. FINDINGS: Lower chest: Right middle lobe pulmonary nodule of 6 mm on image 3/4. Suspect centrilobular emphysema. Normal heart size with tiny left pleural effusion. Right coronary artery atherosclerosis. Retrocrural adenopathy at 10 mm on image 15/2. Hepatobiliary: Subtle irregular hepatic capsule may be artifactual and related to CT reconstruction technique. No focal liver lesion. Normal gallbladder, without biliary ductal dilatation. Pancreas: Normal, without mass or ductal dilatation. Spleen: Normal in size, without focal abnormality. Adrenals/Urinary Tract: Mild right adrenal thickening. Normal left adrenal gland. Too small to characterize interpolar right renal lesion is most likely a cyst. Moderate bilateral hydroureteronephrosis. This is slightly worse on the left, with marked left perinephric interstitial thickening, possibly related to forniceal rupture. Hydroureter is followed only to the upper abdomen, likely obstructed by retroperitoneal adenopathy. Bladder tumor is eccentric right, including on image 62/2. There is more inferior masslike right bladder placed tumor including at 4.4 x 3.5 cm on image 67/2. Stomach/Bowel: Proximal gastric underdistention. Rectal wall thickening is contiguous with bladder and prostatic soft tissue fullness, including on image 71/series 2. There  is a large amount of colonic stool within the more proximal colon, suggesting a component of low-grade obstruction. Normal terminal ileum. Normal small bowel. Vascular/Lymphatic: Advanced aortic and branch vessel atherosclerosis. Bulky retroperitoneal adenopathy. Left periaortic nodal tissue surrounds the aorta and measures 5.0 x 3.6 cm on image 39/2. Marked pelvic sidewall adenopathy, including a right external iliac nodal mass of 3.9 x 5.4 cm on image  57/2. Reproductive: Despite the clinical history, the prostate appears present including on image 72/series 2. Soft tissue is positioned between the posterior right bladder wall and rectum, contiguous with the superior aspect of the prostate and right seminal vesicle. Example image 66/2. Other: Perirectal edema. No abdominopelvic ascites. Tiny fat containing left inguinal hernia. Musculoskeletal: Bilateral L5 pars defects with grade 2 L5-S1 anterolisthesis. Heterogeneous marrow density, including in the right-side of the L1 vertebral body on image 47/6. IMPRESSION: 1. 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 Infiltrative soft tissue involves the bladder, rectum, and superior aspect of the prostate. Bladder primary is slightly favored. Prostatic or rectal primary is felt less likely. Given the clinical history and appearance of the rectum, fistulous communication between bladder and rectum cannot be excluded. No gas within the bladder to confirm fistula. 2. Suspect a component of bowel obstruction at the level of rectal involvement. 3. Extensive nodal metastasis within the abdomen and retrocrural space of the lower chest. 4. Bilateral hydronephrosis, likely related to retroperitoneal adenopathy. Extensive left perirenal edema, suggesting forniceal rupture. 5. Tiny left pleural effusion. 6. Coronary artery atherosclerosis. Aortic Atherosclerosis (ICD10-I70.0). 7. Suspect osseous metastasis, most apparent at L1. 8. Nonspecific right middle lobe pulmonary nodule. These results will be called to the ordering clinician or representative by the Radiologist Assistant, and communication documented in the PACS or zVision Dashboard. Electronically Signed   By: Abigail Miyamoto M.D.   On: 04/11/2017 16:00   US Renal  Result Date: 04/12/2017 CLINICAL DATA:  History of metastatic disease from primary pelvic lesion, hydronephrosis EXAM: RENAL / URINARY TRACT ULTRASOUND COMPLETE COMPARISON:  CT abdomen pelvis of  04/19/2017 FINDINGS: Right Kidney: Length: 10.0 cm. Moderate hydronephrosis is present. The renal parenchyma echogenicity is unremarkable. Left Kidney: Length: 10.1 cm. No hydronephrosis is currently seen. The echogenicity of the renal parenchyma is within normal limits. Bladder: The urinary bladder is decompressed with Foley catheter present. Urinary bladder wall does appear to be diffusely thickened. IMPRESSION: 1. Right-sided moderate hydronephrosis.  No left hydronephrosis. 2. The urinary bladder is decompressed but does appear thick walled. Electronically Signed   By: Ivar Drape M.D.   On: 04/12/2017 17:13    Labs:  CBC: Recent Labs    04/08/17 1026 04/11/17 1210 04/12/17 0419 04/13/17 0403  WBC 5.2 5.5 5.4 6.0  HGB 9.0* 9.7* 9.5* 9.3*  HCT 27.4* 29.9* 28.7* 28.6*  PLT 273 269 253 259    COAGS: Recent Labs    04/08/17 1026 04/11/17 1719 04/12/17 1139  INR 1.28 1.12 1.19  APTT  --  33  --     BMP: Recent Labs    04/08/17 1026 04/11/17 1210 04/12/17 0419 04/13/17 0403  NA 130* 132* 135 135  K 3.6 3.9 4.1 3.5  CL 100* 98* 101 104  CO2 20* 22 24 22   GLUCOSE 239* 289* 193* 139*  BUN 21* 32* 24* 18  CALCIUM 8.6* 8.4* 8.0* 7.9*  CREATININE 1.23 1.42* 1.17 1.00  GFRNONAA 53* 45* 57* >60  GFRAA >60 52* >60 >60    LIVER FUNCTION TESTS: Recent Labs  04/08/17 1026 04/11/17 1210  BILITOT 0.5 0.5  AST 44* 37  ALT 17 19  ALKPHOS 111 107  PROT 7.7 7.7  ALBUMIN 3.1* 3.1*    TUMOR MARKERS: No results for input(s): AFPTM, CEA, CA199, CHROMGRNA in the last 8760 hours.  Assessment and Plan: Metastatic process, pathology to identify primary pending Persitsent right hydronephrosis from ureteral obstruction. Plan for (R)perc neph tube placement Labs ok Risks and benefits of right perc nephrostomy tube placement were discussed with the patient including, but not limited to, infection, bleeding, significant bleeding causing loss or decrease in renal function or  damage to adjacent structures.   All of the patient's questions were answered, patient is agreeable to proceed.  Consent signed and in chart.    Thank you for this interesting consult.  I greatly enjoyed meeting IAAN OREGEL and look forward to participating in their care.  A copy of this report was sent to the requesting provider on this date.  Electronically Signed: Ascencion Dike, PA-C 04/13/2017, 10:04 AM   I spent a total of 23 minutes in face to face in clinical consultation, greater than 50% of which was counseling/coordinating care for (R)PCN placement

## 2017-04-13 NOTE — Procedures (Signed)
Pre Procedure Dx: Hydronephrosis Post Procedural Dx: Same  Successful US and fluoroscopic guided placement of a right sided PCN with end coiled and locked within the renal pelvis. PCN connected to gravity bag.  EBL: Minimal  Complications: None immediate.  Jay Aaric Dolph, MD Pager #: [phone]     

## 2017-04-14 ENCOUNTER — Inpatient Hospital Stay: Payer: Medicare Other

## 2017-04-14 DIAGNOSIS — N1330 Unspecified hydronephrosis: Secondary | ICD-10-CM

## 2017-04-14 DIAGNOSIS — R338 Other retention of urine: Secondary | ICD-10-CM

## 2017-04-14 DIAGNOSIS — C61 Malignant neoplasm of prostate: Secondary | ICD-10-CM

## 2017-04-14 DIAGNOSIS — R590 Localized enlarged lymph nodes: Secondary | ICD-10-CM

## 2017-04-14 LAB — GLUCOSE, CAPILLARY
Glucose-Capillary: 279 mg/dL — ABNORMAL HIGH (ref 65–99)
Glucose-Capillary: 164 mg/dL — ABNORMAL HIGH (ref 65–99)
Glucose-Capillary: 251 mg/dL — ABNORMAL HIGH (ref 65–99)
Glucose-Capillary: 267 mg/dL — ABNORMAL HIGH (ref 65–99)
Glucose-Capillary: 238 mg/dL — ABNORMAL HIGH (ref 65–99)

## 2017-04-14 MED ORDER — GLIMEPIRIDE 2 MG PO TABS
4.0000 mg | ORAL_TABLET | Freq: Two times a day (BID) | ORAL | Status: DC
  Administered 2017-04-14 – 2017-04-16 (×4): 4 mg via ORAL
  Filled 2017-04-14 (×3): qty 2
  Filled 2017-04-14: qty 1
  Filled 2017-04-14: qty 2

## 2017-04-14 MED ORDER — IPRATROPIUM-ALBUTEROL 0.5-2.5 (3) MG/3ML IN SOLN
3.0000 mL | Freq: Four times a day (QID) | RESPIRATORY_TRACT | Status: DC
  Administered 2017-04-14 – 2017-04-15 (×4): 3 mL via RESPIRATORY_TRACT
  Filled 2017-04-14 (×3): qty 3

## 2017-04-14 MED ORDER — GUAIFENESIN ER 600 MG PO TB12
600.0000 mg | ORAL_TABLET | Freq: Two times a day (BID) | ORAL | Status: DC
  Administered 2017-04-14 – 2017-04-16 (×5): 600 mg via ORAL
  Filled 2017-04-14 (×5): qty 1

## 2017-04-14 MED ORDER — DEGARELIX ACETATE 120 MG ~~LOC~~ SOLR: 240.0000 mg | Freq: Once | SUBCUTANEOUS | Status: DC

## 2017-04-14 MED ORDER — IPRATROPIUM-ALBUTEROL 0.5-2.5 (3) MG/3ML IN SOLN: 3.0000 mL | RESPIRATORY_TRACT | Status: DC

## 2017-04-14 MED ORDER — GLUCERNA SHAKE PO LIQD
237.0000 mL | Freq: Two times a day (BID) | ORAL | Status: DC
  Administered 2017-04-14 – 2017-04-15 (×3): 237 mL via ORAL

## 2017-04-14 MED ORDER — GUAIFENESIN-DM 100-10 MG/5ML PO SYRP
15.0000 mL | ORAL_SOLUTION | ORAL | Status: DC | PRN
  Filled 2017-04-14: qty 15

## 2017-04-14 NOTE — Progress Notes (Signed)
[place] [facility] [place] NAME: Michael [name]    MR#:  [id number]  DATE OF BIRTH:  Jan 27, 1934  SUBJECTIVE:  CHIEF COMPLAINT:  No chief complaint on file.  Complains of pain at the site of the nephrostomy tubes   REVIEW OF SYSTEMS:  Review of Systems  Constitutional: Negative for chills, fever and weight loss.  HENT: Negative for nosebleeds and sore throat.   Eyes: Negative for blurred vision.  Respiratory: Negative for cough, shortness of breath and wheezing.   Cardiovascular: Negative for chest pain, orthopnea, leg swelling and PND.  Gastrointestinal: Positive for abdominal pain. Negative for constipation, diarrhea, heartburn, nausea and vomiting.  Genitourinary: Negative for dysuria and urgency.  Musculoskeletal: Negative for back pain.  Skin: Negative for rash.  Neurological: Negative for dizziness, speech change, focal weakness and headaches.  Endo/Heme/Allergies: Does not bruise/bleed easily.  Psychiatric/Behavioral: Negative for depression.    DRUG ALLERGIES:   Allergies  Allergen Reactions  . Zosyn [Piperacillin Sod-Tazobactam So] Swelling    Lips swollen.  Was given at hospital through IV   VITALS:  Blood pressure (!) 157/85, pulse 90, temperature (!) 97.5 F (36.4 C), temperature source Oral, resp. rate (!) 21, height 5\' 6"  (1.676 m), weight 129 lb (58.5 kg), SpO2 94 %. PHYSICAL EXAMINATION:  Physical Exam  Constitutional: He is oriented to person, place, and time and well-developed, well-nourished, and in no distress.  HENT:  Head: Normocephalic and atraumatic.  Eyes: Conjunctivae and EOM are normal. Pupils are equal, round, and reactive to light.  Neck: Normal range of motion. Neck supple. No tracheal deviation present. No thyromegaly present.  Cardiovascular: Normal rate, regular rhythm and normal heart sounds.  Pulmonary/Chest: Effort normal and breath sounds normal. No respiratory distress. He has no wheezes. He  exhibits no tenderness.  Abdominal: Soft. Bowel sounds are normal. He exhibits no distension. There is no tenderness.  Musculoskeletal: Normal range of motion.  Neurological: He is alert and oriented to person, place, and time. No cranial nerve deficit.  Generalized weakness.  Skin: Skin is warm and dry. No rash noted.  Psychiatric: Mood and affect normal.   LABORATORY PANEL:  Male CBC Recent Labs  Lab 04/13/17 0403  WBC 6.0  HGB 9.3*  HCT 28.6*  PLT 259   ------------------------------------------------------------------------------------------------------------------ Chemistries  Recent Labs  Lab 04/11/17 1210  04/13/17 0403  NA 132*   < > 135  K 3.9   < > 3.5  CL 98*   < > 104  CO2 22   < > 22  GLUCOSE 289*   < > 139*  BUN 32*   < > 18  CREATININE 1.42*   < > 1.00  CALCIUM 8.4*   < > 7.9*  AST 37  --   --   ALT 19  --   --   ALKPHOS 107  --   --   BILITOT 0.5  --   --    < > = values in this interval not displayed.   RADIOLOGY:  No results found. ASSESSMENT AND PLAN:  84 y.o. male with a known history of CAD, peripheral vascular disease, diabetes, hypertension, influenza a and hyperlipidemia admitted for abdominal pain and found to have incidental advanced metastatic disease   1.  Abdominal pain likely from some underlying Malignancy with constipation.   Based on pathology results due to prostate metastases, continue pain control 2.  Acute kidney injury and b/l hydronephrosis with dehydration: Status post stent on  the left, as well as right-sided nephrostomy tube   Renal func now normal 3.  Cough shortness of breath obtain a chest x-ray placed on breathing treatments.incentive spirometry 4.  Hyperlipidemia unspecified on statin. 5.  Anemia guaiac negative stools, high ferritin 6.  Essential hypertension continue beta-blocker 7.  BPH on finasteride 8.  Influenza A treated for the flu 9.  Weakness: Due to anemia and likely underlying malignancy- PT suggest HHA  PT. 10.  Type 2 diabetes mellitus.  Start sliding scale insulin and hold Amaryl 11. Urinary retention-secondary to bladder neck contracture/pelvic mass. s/p Bladder neck dilation by Urology and Foley placement.  Likely need Foley at discharge per urology    All the records are reviewed and case discussed with Care Management/Social Worker. Management plans discussed with the patient, family and they are in agreement.  CODE STATUS: Full Code  TOTAL TIME TAKING CARE OF THIS PATIENT: 35 minutes.   More than 50% of the time was spent in counseling/coordination of care: YES  POSSIBLE D/C IN 2-3 DAYS, DEPENDING ON CLINICAL CONDITION.   Dustin Flock M.D on 04/14/2017 at 2:44 PM  Between 7am to 6pm - Pager - [phone]  After 6pm go to [email or web address] - password [email or web address] [facility]  [employer] [facility]  Office  [phone]  CC: Primary care physician; [name], MD  Note: This dictation was prepared with Dragon dictation along with smaller phrase technology. Any transcriptional errors that result from this process are unintentional.

## 2017-04-14 NOTE — Progress Notes (Signed)
Michael [name]   DOB:1934/01/07   YK#:[id number]    Subjective: Patient is resting the bed.  Daughter is at bedside.  Patient reports feeling well.  No pain.  Appetite is poor and he feels weak.   ROS: No chest pain.  No headaches, denies any pain today..  Complete 10 point review system is done which is negative except mentioned above.  Objective:  Vitals:   04/14/17 0515 04/14/17 0817  BP: (!) 150/76 (!) 157/85  Pulse: 98 90  Resp: (!) 21   Temp: 98.1 F (36.7 C) (!) 97.5 F (36.4 C)  SpO2: 93% 94%     Intake/Output Summary (Last 24 hours) at 04/14/2017 1539 Last data filed at 04/14/2017 1000 Gross per 24 hour  Intake 10 ml  Output 2400 ml  Net -2390 ml    GENERAL Alert, no distress and comfortable.  EYES: no pallor or icterus OROPHARYNX: no thrush or ulceration. NECK: supple, no masses felt LYMPH:  no palpable lymphadenopathy in the cervical, axillary or inguinal regions LUNGS: decreased breath sounds to auscultation at bases and  No wheeze or crackles HEART/CVS: regular rate & rhythm and no murmurs; No lower extremity edema ABDOMEN: abdomen soft, tender  on deep palpation. and normal bowel sounds; Foley catheter draining pinkish urine.  Positive for nephrostomy bag Musculoskeletal:no cyanosis of digits and no clubbing  PSYCH: alert & oriented x 3 with fluent speech NEURO: no focal motor/sensory deficits SKIN:  no rashes or significant lesions   Labs:  Lab Results  Component Value Date   WBC 6.0 04/13/2017   HGB 9.3 (L) 04/13/2017   HCT 28.6 (L) 04/13/2017   MCV 83.3 04/13/2017   PLT 259 04/13/2017    Lab Results  Component Value Date   NA 135 04/13/2017   K 3.5 04/13/2017   CL 104 04/13/2017   CO2 22 04/13/2017    Studies:  [place] Intraoperative  Result Date: 04/13/2017 CLINICAL DATA:  Ultrasound was provided for use by the ordering physician, and a technical charge was applied by the performing facility.  No radiologist interpretation/professional  services rendered.   US Renal  Result Date: 04/12/2017 CLINICAL DATA:  History of metastatic disease from primary pelvic lesion, hydronephrosis EXAM: RENAL / URINARY TRACT ULTRASOUND COMPLETE COMPARISON:  CT abdomen pelvis of 04/19/2017 FINDINGS: Right Kidney: Length: 10.0 cm. Moderate hydronephrosis is present. The renal parenchyma echogenicity is unremarkable. Left Kidney: Length: 10.1 cm. No hydronephrosis is currently seen. The echogenicity of the renal parenchyma is within normal limits. Bladder: The urinary bladder is decompressed with Foley catheter present. Urinary bladder wall does appear to be diffusely thickened. IMPRESSION: 1. Right-sided moderate hydronephrosis.  No left hydronephrosis. 2. The urinary bladder is decompressed but does appear thick walled. Electronically Signed   By: Ivar Drape M.D.   On: 04/12/2017 17:13   Ir Nephrostomy Placement Right  Result Date: 04/13/2017 INDICATION: History of metastatic prostate cancer, now with bilateral obstructive uropathy. Patient was able to undergo successful left-sided retrograde ureteral stent placement however failed attempted right-sided retrograde stent placement secondary to nonvisualization of the right urinary bladder trigone. As such, request made for image guided placement of a right-sided percutaneous nephrostomy catheter. EXAM: 1. ULTRASOUND GUIDANCE FOR PUNCTURE OF THE RIGHT RENAL COLLECTING SYSTEM 2. RIGHT PERCUTANEOUS NEPHROSTOMY TUBE PLACEMENT. COMPARISON:  CT abdomen pelvis - 04/11/2017; renal ultrasound - 04/12/2017 MEDICATIONS: Ciprofloxacin 400 mg IV; the antibiotic was administered in an appropriate time frame prior to skin puncture. ANESTHESIA/SEDATION: Moderate (conscious) sedation was employed during this  procedure. A total of Versed 2 mg and Fentanyl 75 mcg was administered intravenously. Moderate Sedation Time: 16 minutes. The patient's level of consciousness and vital signs were monitored continuously by radiology nursing  throughout the procedure under my direct supervision. CONTRAST:  20 mL Isovue 300 administered into the collecting system FLUOROSCOPY TIME:  1 minute 36 seconds (998 mGy) COMPLICATIONS: None immediate. PROCEDURE: The procedure, risks, benefits, and alternatives were explained to the patient. Questions regarding the procedure were encouraged and answered. The patient understands and consents to the procedure. A timeout was performed prior to the initiation of the procedure. The right flank region was prepped with Betadine in a sterile fashion, and a sterile drape was applied covering the operative field. A sterile gown and sterile gloves were used for the procedure. Local anesthesia was provided with 1% Lidocaine with epinephrine. Ultrasound was used to localize the right kidney. Under direct ultrasound guidance, a 21 gauge needle was advanced into the renal collecting system. An ultrasound image documentation was performed. Access within the collecting system was confirmed with the efflux of urine followed by contrast injection. Over a Nitrex wire, the inner three [place] catheter of an Accustick set was advanced into the renal collecting system. Contrast injection was injected into the collecting system as several spot radiographs were obtained in various obliquities confirming puncture within a posterior inferior calix. As such, the tract was dilated with an Accustick stent. Over a guide wire, a 10-French percutaneous nephrostomy catheter was advanced into the collecting system where the coil was formed and locked. Contrast was injected and several sport radiographs were obtained in various obliquities confirming access. The catheter was secured at the skin with a Prolene retention suture and a gravity bag was placed. A dressing was placed. The patient tolerated procedure well without immediate postprocedural complication. FINDINGS: Ultrasound scanning demonstrates a moderate dilated right collecting system. Under  direct ultrasound guidance, a posterior inferior calix was targeted allowing advancement of an 10-French percutaneous nephrostomy catheter under intermittent fluoroscopic guidance. Contrast injection confirmed appropriate positioning. IMPRESSION: Successful ultrasound and fluoroscopic guided placement of a right sided 10 French PCN. Electronically Signed   By: Sandi Mariscal M.D.   On: 04/13/2017 13:33    Assessment & Plan:  84 year old male patient is currently up to the hospital for difficulty with urination/weight loss-CT scan shows pelvic lesions concerning for malignancy  # Metastatic disease in the pelvis-involving bladder and rectum/prostate-status post cystoscopy and biopsy.  Covering Dr.Brahmanday on weekend. Based on my conversation with Dr.Brahmanday, Dr.Rubinas have discussed with him and patient's daughter that patient has metastatic prostate cancer. (See Dr.Brahmanday's note).  With [employer] online interpreter, I discussed with patient and daughter about the verbal pathology report which showed prostate cancer.  Patient and daughter understand that is not curable but treatable.  They understand the rationale of starting anti-hormonal treatment first and they also understand the possible side effects including but not limited to fatigue, allergic reactions, anemia, decreased sexual activity performance, etc.  They understand that patient need to follow-up outpatient with Dr. Rogue Bussing for additional treatments.  Patient and daughter were given opportunities to ask questions and all questions answered to their satisfaction.  I agree with Dr. Velora Mediate opinion that given patient overall tumor burden and being symptomatic, plan starting Degarelix loading dose inpatient today. I have ordered degarelix 240 mg subcutaneous x1, discussed with patient's RN.  Patient and daughter understand that this medical treatment decision was based on verbal pathology report.  Final pathology  results will be  reported next week..  Patient and daughter wants to start treatment early due to patient's symptoms and they are willing to take the small risk of final pathology results being a different diagnosis.     # Acute retention of urine/status post Foley catheter; status post left-sided stenting; status post right-sided nephrostomy, defer urology for management.   #Anemia-likely secondary to anemia of chronic disease, stable continue to monitor.   Patient could follow-up outpatient with Dr.Brahmanday.   # 40 minutes face-to-face with the patient/family discussing the above plan of care; more than 50% of time spent on prognosis/ natural history; counseling and coordination. [employer] interpretor service was utilized for the entire encounter.    Earlie Server, MD, PhD Hematology Oncology [facility] Pager- [phone] 04/14/2017

## 2017-04-14 NOTE — Progress Notes (Signed)
Physical Therapy Treatment Patient Details Name: Michael [name] MRN: [id number] DOB: 02-Mar-1933 Today's Date: 04/14/2017    History of Present Illness presented to ER secondary to severe, worsening abdominal pain; admitted for work-up of abdominal pain, AKI secondary to dehydration.    PT Comments    Pt in bed, ready for session.  Transitioned out of bed with ease.  Ambulated to/from bathroom for bm with walker and min guard, assist for drain and catheter management.  After return to recliner he was able to stand again and ambulate x 2 around small nursing pod with walker and min guard/assist.  No LOB or bucking but he did voice general fatigue.  Daughter in for session.  Discussed home management and answered questions as appropriate.   Follow Up Recommendations  Home health PT     Equipment Recommendations  Rolling walker with 5" wheels    Recommendations for Other Services       Precautions / Restrictions Precautions Precautions: Fall Restrictions Weight Bearing Restrictions: No    Mobility  Bed Mobility Overal bed mobility: Modified Independent                Transfers Overall transfer level: Modified independent Equipment used: Rolling walker (2 wheeled) Transfers: Sit to/from Stand           General transfer comment: cuing for hand placement to prevent pulling on RW  Ambulation/Gait Ambulation/Gait assistance: Min guard;Min assist Ambulation Distance (Feet): 250 Feet Assistive device: Rolling walker (2 wheeled) Gait Pattern/deviations: Step-through pattern   Gait velocity interpretation: Below normal speed for age/gender General Gait Details: generally steady but voiced fatigue after 2 laps around small nursing pod.   Stairs            Wheelchair Mobility    Modified Rankin (Stroke Patients Only)       Balance Overall balance assessment: Modified Independent Sitting-balance support: Feet supported Sitting balance-Leahy Scale:  Good     Standing balance support: Bilateral upper extremity supported Standing balance-Leahy Scale: Fair                              Cognition Arousal/Alertness: Awake/alert Behavior During Therapy: WFL for tasks assessed/performed Overall Cognitive Status: Within Functional Limits for tasks assessed                                        Exercises Other Exercises Other Exercises: to and from bathroom for bm.  nursing in for care and to empty drainage bag.    General Comments        Pertinent Vitals/Pain Pain Assessment: No/denies pain Pain Location: seemed generally comfortable.  voiced no complaints.    Home Living                      Prior Function            PT Goals (current goals can now be found in the care plan section) Progress towards PT goals: Progressing toward goals    Frequency    Min 2X/week      PT Plan Current plan remains appropriate    Co-evaluation              AM-PAC PT "6 Clicks" Daily Activity  Outcome Measure  Difficulty turning over in bed (including adjusting bedclothes, sheets and blankets)?: A Little  Difficulty moving from lying on back to sitting on the side of the bed? : A Little Difficulty sitting down on and standing up from a chair with arms (e.g., wheelchair, bedside commode, etc,.)?: A Little Help needed moving to and from a bed to chair (including a wheelchair)?: A Little Help needed walking in hospital room?: A Little Help needed climbing 3-5 steps with a railing? : A Little 6 Click Score: 18    End of Session Equipment Utilized During Treatment: Gait belt Activity Tolerance: Patient limited by fatigue Patient left: in chair;with chair alarm set;with family/visitor present;with call bell/phone within reach Nurse Communication: Mobility status       Time: 8250-5397 PT Time Calculation (min) (ACUTE ONLY): 30 min  Charges:  $Gait Training: 8-22 mins $Therapeutic  Activity: 8-22 mins                    G Codes:       Chesley Noon, PTA 04/14/17, 10:34 AM

## 2017-04-14 NOTE — Progress Notes (Signed)
3 Days Post-Op Subjective: Patient reports no issues. Doing well.   Objective: Vital signs in last 24 hours: Temp:  [97.5 F (36.4 C)-98.1 F (36.7 C)] 97.5 F (36.4 C) (02/23 0817) Pulse Rate:  [90-102] 90 (02/23 0817) Resp:  [20-24] 21 (02/23 0515) BP: (146-157)/(62-85) 157/85 (02/23 0817) SpO2:  [91 %-94 %] 94 % (02/23 0817)  Intake/Output from previous day: 02/22 0701 - 02/23 0700 In: 10  Out: 2750 [Urine:2750] Intake/Output this shift: Total I/O In: -  Out: 250 [Urine:250]  Physical Exam:  Thin Foley - urine with very light hematuria Rt PCN - clear urine   Lab Results: Recent Labs    04/12/17 0419 04/13/17 0403  HGB 9.5* 9.3*  HCT 28.7* 28.6*   BMET Recent Labs    04/12/17 0419 04/13/17 0403  NA 135 135  K 4.1 3.5  CL 101 104  CO2 24 22  GLUCOSE 193* 139*  BUN 24* 18  CREATININE 1.17 1.00  CALCIUM 8.0* 7.9*   Recent Labs    04/11/17 1719 04/12/17 1139  INR 1.12 1.19   No results for input(s): LABURIN in the last 72 hours. Results for orders placed or performed during the hospital encounter of 04/08/17  Urine C&S     Status: None   Collection Time: 04/08/17 10:16 AM  Result Value Ref Range Status   Specimen Description   Final    URINE, CLEAN CATCH Performed at [facility], [address] [place], [place] [address]    Special Requests   Final    NONE Performed at [facility], [address] [place], [place] [address]    Culture   Final    NO GROWTH Performed at [facility], [place] [address], [place], [place] [address]    Report Status 04/09/2017 FINAL  Final  Culture, blood (Routine x 2)     Status: None   Collection Time: 04/08/17 10:26 AM  Result Value Ref Range Status   Specimen Description BLOOD BLOOD RIGHT ARM  Final   Special Requests   Final    BOTTLES DRAWN AEROBIC AND ANAEROBIC Blood Culture results may not be optimal due to an excessive volume of blood received in culture bottles   Culture    Final    NO GROWTH 5 DAYS Performed at [facility], [address] [place], [place] [address]    Report Status 04/13/2017 FINAL  Final  Culture, blood (Routine x 2)     Status: None   Collection Time: 04/08/17 10:26 AM  Result Value Ref Range Status   Specimen Description BLOOD BLOOD LEFT FOREARM  Final   Special Requests   Final    BOTTLES DRAWN AEROBIC AND ANAEROBIC Blood Culture results may not be optimal due to an excessive volume of blood received in culture bottles   Culture   Final    NO GROWTH 5 DAYS Performed at [facility], [address] [place], [place] [address]    Report Status 04/13/2017 FINAL  Final    Studies/Results: [place] Intraoperative  Result Date: 04/13/2017 CLINICAL DATA:  Ultrasound was provided for use by the ordering physician, and a technical charge was applied by the performing facility.  No radiologist interpretation/professional services rendered.   US Renal  Result Date: 04/12/2017 CLINICAL DATA:  History of metastatic disease from primary pelvic lesion, hydronephrosis EXAM: RENAL / URINARY TRACT ULTRASOUND COMPLETE COMPARISON:  CT abdomen pelvis of 04/19/2017 FINDINGS: Right Kidney: Length: 10.0 cm. Moderate hydronephrosis is  present. The renal parenchyma echogenicity is unremarkable. Left Kidney: Length: 10.1 cm. No hydronephrosis is currently seen. The echogenicity of the renal parenchyma is within normal limits. Bladder: The urinary bladder is decompressed with Foley catheter present. Urinary bladder wall does appear to be diffusely thickened. IMPRESSION: 1. Right-sided moderate hydronephrosis.  No left hydronephrosis. 2. The urinary bladder is decompressed but does appear thick walled. Electronically Signed   By: Ivar Drape M.D.   On: 04/12/2017 17:13   Dg Chest Port 1 View  Result Date: 04/14/2017 CLINICAL DATA:  Non smoker, neph tube(s) placed yesterday, now with SOBPer chart: a known history of CAD, peripheral vascular disease,  diabetes, hypertension, influenza a and hyperlipidemia admitted for abdominal pain and found to have incidental advanced metastatic disease EXAM: PORTABLE CHEST 1 VIEW COMPARISON:  04/08/2017 FINDINGS: Cardiac silhouette is normal in size. No mediastinal or hilar masses. No evidence of adenopathy. Clear lungs.  No pleural effusion or pneumothorax. Skeletal structures are grossly intact. IMPRESSION: No active disease. Electronically Signed   By: Lajean Manes M.D.   On: 04/14/2017 15:30   Ir Nephrostomy Placement Right  Result Date: 04/13/2017 INDICATION: History of metastatic prostate cancer, now with bilateral obstructive uropathy. Patient was able to undergo successful left-sided retrograde ureteral stent placement however failed attempted right-sided retrograde stent placement secondary to nonvisualization of the right urinary bladder trigone. As such, request made for image guided placement of a right-sided percutaneous nephrostomy catheter. EXAM: 1. ULTRASOUND GUIDANCE FOR PUNCTURE OF THE RIGHT RENAL COLLECTING SYSTEM 2. RIGHT PERCUTANEOUS NEPHROSTOMY TUBE PLACEMENT. COMPARISON:  CT abdomen pelvis - 04/11/2017; renal ultrasound - 04/12/2017 MEDICATIONS: Ciprofloxacin 400 mg IV; the antibiotic was administered in an appropriate time frame prior to skin puncture. ANESTHESIA/SEDATION: Moderate (conscious) sedation was employed during this procedure. A total of Versed 2 mg and Fentanyl 75 mcg was administered intravenously. Moderate Sedation Time: 16 minutes. The patient's level of consciousness and vital signs were monitored continuously by radiology nursing throughout the procedure under my direct supervision. CONTRAST:  20 mL Isovue 300 administered into the collecting system FLUOROSCOPY TIME:  1 minute 36 seconds (948 mGy) COMPLICATIONS: None immediate. PROCEDURE: The procedure, risks, benefits, and alternatives were explained to the patient. Questions regarding the procedure were encouraged and answered.  The patient understands and consents to the procedure. A timeout was performed prior to the initiation of the procedure. The right flank region was prepped with Betadine in a sterile fashion, and a sterile drape was applied covering the operative field. A sterile gown and sterile gloves were used for the procedure. Local anesthesia was provided with 1% Lidocaine with epinephrine. Ultrasound was used to localize the right kidney. Under direct ultrasound guidance, a 21 gauge needle was advanced into the renal collecting system. An ultrasound image documentation was performed. Access within the collecting system was confirmed with the efflux of urine followed by contrast injection. Over a Nitrex wire, the inner three [place] catheter of an Accustick set was advanced into the renal collecting system. Contrast injection was injected into the collecting system as several spot radiographs were obtained in various obliquities confirming puncture within a posterior inferior calix. As such, the tract was dilated with an Accustick stent. Over a guide wire, a 10-French percutaneous nephrostomy catheter was advanced into the collecting system where the coil was formed and locked. Contrast was injected and several sport radiographs were obtained in various obliquities confirming access. The catheter was secured at the skin with a Prolene retention suture and a gravity bag was  placed. A dressing was placed. The patient tolerated procedure well without immediate postprocedural complication. FINDINGS: Ultrasound scanning demonstrates a moderate dilated right collecting system. Under direct ultrasound guidance, a posterior inferior calix was targeted allowing advancement of an 10-French percutaneous nephrostomy catheter under intermittent fluoroscopic guidance. Contrast injection confirmed appropriate positioning. IMPRESSION: Successful ultrasound and fluoroscopic guided placement of a right sided 10 French PCN. Electronically Signed    By: Sandi Mariscal M.D.   On: 04/13/2017 13:33    Assessment/Plan:  Impression/Plan: 84 year old male with failure to thrive, influenzaApresenting with abdominal pain found to have incidental advanced metastatic disease as described above likely pelvic in origin, pathology pending.    1. Urinary retention-secondary to bladder neck contracture/pelvic mass.  Bladder neck dilated and Foley catheter placed in the operating room.  Maintain Foley.  Flush as needed.  2. Bilateral hydronephrosis, acute kidney injury-creatinine improving following left-sided stent placement and bladder decompression.  Unable to identify right ureteral orifice likely secondary to obstructing tumor. S/p Right PCNx 04/13/2017.   3.Locally advanced metastatic malignancy of unknown primary-PSA mildly elevated to 9.  Rectal exam was grossly abnormal with firm nodular prostate as well as a mass extending for the prostate.  Cystoscopy reveals primarily submucosal mass with mass-effect into the bladder other than necrotic material at the bladder neck which was biopsied.  The findings favor prostate cancer.  No indication for retroperitoneal biopsy at this point in time.  Follow-up final pathology.   LOS: 3 days   Michael [name] 04/14/2017, 3:44 PM

## 2017-04-15 DIAGNOSIS — N1330 Unspecified hydronephrosis: Secondary | ICD-10-CM

## 2017-04-15 DIAGNOSIS — R338 Other retention of urine: Secondary | ICD-10-CM

## 2017-04-15 LAB — GLUCOSE, CAPILLARY
Glucose-Capillary: 207 mg/dL — ABNORMAL HIGH (ref 65–99)
Glucose-Capillary: 257 mg/dL — ABNORMAL HIGH (ref 65–99)
Glucose-Capillary: 270 mg/dL — ABNORMAL HIGH (ref 65–99)
Glucose-Capillary: 270 mg/dL — ABNORMAL HIGH (ref 65–99)

## 2017-04-15 MED ORDER — IPRATROPIUM-ALBUTEROL 0.5-2.5 (3) MG/3ML IN SOLN
3.0000 mL | Freq: Four times a day (QID) | RESPIRATORY_TRACT | Status: DC | PRN
  Administered 2017-04-15: 3 mL via RESPIRATORY_TRACT
  Filled 2017-04-15: qty 3

## 2017-04-15 MED ORDER — BICALUTAMIDE 50 MG PO TABS
50.0000 mg | ORAL_TABLET | Freq: Every day | ORAL | Status: DC
  Administered 2017-04-15 – 2017-04-16 (×2): 50 mg via ORAL
  Filled 2017-04-15 (×2): qty 1

## 2017-04-15 NOTE — Progress Notes (Signed)
[place] [facility] [place] NAME: Michael [name]    MR#:  [id number]  DATE OF BIRTH:  30-Nov-1933  SUBJECTIVE:  CHIEF COMPLAINT:  No chief complaint on file.  Has pain at the site of the nephrostomy tube Daughter concerned about discharge planning for today   REVIEW OF SYSTEMS:  Review of Systems  Constitutional: Negative for chills, fever and weight loss.  HENT: Negative for nosebleeds and sore throat.   Eyes: Negative for blurred vision.  Respiratory: Negative for cough, shortness of breath and wheezing.   Cardiovascular: Negative for chest pain, orthopnea, leg swelling and PND.  Gastrointestinal: Positive for abdominal pain. Negative for constipation, diarrhea, heartburn, nausea and vomiting.  Genitourinary: Negative for dysuria and urgency.  Musculoskeletal: Negative for back pain.  Skin: Negative for rash.  Neurological: Negative for dizziness, speech change, focal weakness and headaches.  Endo/Heme/Allergies: Does not bruise/bleed easily.  Psychiatric/Behavioral: Negative for depression.    DRUG ALLERGIES:   Allergies  Allergen Reactions  . Zosyn [Piperacillin Sod-Tazobactam So] Swelling    Lips swollen.  Was given at hospital through IV   VITALS:  Blood pressure 140/79, pulse (!) 108, temperature 98 F (36.7 C), temperature source Oral, resp. rate 18, height 5\' 6"  (1.676 m), weight 129 lb (58.5 kg), SpO2 94 %. PHYSICAL EXAMINATION:  Physical Exam  Constitutional: He is oriented to person, place, and time and well-developed, well-nourished, and in no distress.  HENT:  Head: Normocephalic and atraumatic.  Eyes: Conjunctivae and EOM are normal. Pupils are equal, round, and reactive to light.  Neck: Normal range of motion. Neck supple. No tracheal deviation present. No thyromegaly present.  Cardiovascular: Normal rate, regular rhythm and normal heart sounds.  Pulmonary/Chest: Effort normal and breath sounds normal. No  respiratory distress. He has no wheezes. He exhibits no tenderness.  Abdominal: Soft. Bowel sounds are normal. He exhibits no distension. There is no tenderness.  Musculoskeletal: Normal range of motion.  Neurological: He is alert and oriented to person, place, and time. No cranial nerve deficit.  Generalized weakness.  Skin: Skin is warm and dry. No rash noted.  Psychiatric: Mood and affect normal.   LABORATORY PANEL:  Male CBC Recent Labs  Lab 04/13/17 0403  WBC 6.0  HGB 9.3*  HCT 28.6*  PLT 259   ------------------------------------------------------------------------------------------------------------------ Chemistries  Recent Labs  Lab 04/11/17 1210  04/13/17 0403  NA 132*   < > 135  K 3.9   < > 3.5  CL 98*   < > 104  CO2 22   < > 22  GLUCOSE 289*   < > 139*  BUN 32*   < > 18  CREATININE 1.42*   < > 1.00  CALCIUM 8.4*   < > 7.9*  AST 37  --   --   ALT 19  --   --   ALKPHOS 107  --   --   BILITOT 0.5  --   --    < > = values in this interval not displayed.   RADIOLOGY:  No results found. ASSESSMENT AND PLAN:  84 y.o. male with a known history of CAD, peripheral vascular disease, diabetes, hypertension, influenza a and hyperlipidemia admitted for abdominal pain and found to have incidental advanced metastatic disease   1.  Abdominal pain likely from some underlying Malignancy with constipation.   Based on pathology results due to prostate metastases, continue pain control Oncology recommended Casodex however patient's daughter wants to talk to  Dr. Rogue Bussing tomorrow 2.  Acute kidney injury and b/l hydronephrosis with dehydration: Status post stent on the left, as well as right-sided nephrostomy tube   Renal func now normal 3.  Cough shortness of breath chest x-ray is negative continue incentive spirometry 4.  Hyperlipidemia unspecified on statin. 5.  Anemia guaiac negative stools, high ferritin 6.  Essential hypertension continue beta-blocker 7.  BPH on  finasteride 8.  Influenza A treated for the flu 9.  Weakness: Due to anemia and likely underlying malignancy- PT suggest HHA PT. 10.  Type 2 diabetes mellitus.  Start sliding scale insulin and hold Amaryl 11. Urinary retention-secondary to bladder neck contracture/pelvic mass. s/p Bladder neck dilation by Urology and Foley placement.  Likely need Foley at discharge per urology    All the records are reviewed and case discussed with Care Management/Social Worker. Management plans discussed with the patient, family and they are in agreement.  CODE STATUS: Full Code  TOTAL TIME TAKING CARE OF THIS PATIENT: 35 minutes.   More than 50% of the time was spent in counseling/coordination of care: YES  POSSIBLE D/C IN 2-3 DAYS, DEPENDING ON CLINICAL CONDITION.   Dustin Flock M.D on 04/15/2017 at 3:29 PM  Between 7am to 6pm - Pager - [phone]  After 6pm go to [email or web address] - password [email or web address] [facility]  [employer]  Hospitalists  Office  [phone]  CC: Primary care physician; [name], MD  Note: This dictation was prepared with Dragon dictation along with smaller phrase technology. Any transcriptional errors that result from this process are unintentional.

## 2017-04-15 NOTE — Progress Notes (Signed)
4 Days Post-Op Subjective: Patient reports no complaints. Feeling well.   Objective: Vital signs in last 24 hours: Temp:  [97.6 F (36.4 C)-98 F (36.7 C)] 98 F (36.7 C) (02/24 0525) Pulse Rate:  [98-108] 108 (02/24 0525) Resp:  [18] 18 (02/24 0525) BP: (125-140)/(62-79) 140/79 (02/24 0525) SpO2:  [94 %-98 %] 94 % (02/24 0728)  Intake/Output from previous day: 02/23 0701 - 02/24 0700 In: -  Out: 2550 [Urine:2550] Intake/Output this shift: No intake/output data recorded.  Physical Exam:  NAD, sitting on edge of bed Foley and rt Nx tube with clear urine, draining well.   Lab Results: Recent Labs    04/13/17 0403  HGB 9.3*  HCT 28.6*   BMET Recent Labs    04/13/17 0403  NA 135  K 3.5  CL 104  CO2 22  GLUCOSE 139*  BUN 18  CREATININE 1.00  CALCIUM 7.9*   No results for input(s): LABPT, INR in the last 72 hours. No results for input(s): LABURIN in the last 72 hours. Results for orders placed or performed during the hospital encounter of 04/08/17  Urine C&S     Status: None   Collection Time: 04/08/17 10:16 AM  Result Value Ref Range Status   Specimen Description   Final    URINE, CLEAN CATCH Performed at [facility], [address] [place], [place] [address]    Special Requests   Final    NONE Performed at [facility], [address] [place], [place] [address]    Culture   Final    NO GROWTH Performed at [facility], [place] [address], [place], [place] [address]    Report Status 04/09/2017 FINAL  Final  Culture, blood (Routine x 2)     Status: None   Collection Time: 04/08/17 10:26 AM  Result Value Ref Range Status   Specimen Description BLOOD BLOOD RIGHT ARM  Final   Special Requests   Final    BOTTLES DRAWN AEROBIC AND ANAEROBIC Blood Culture results may not be optimal due to an excessive volume of blood received in culture bottles   Culture   Final    NO GROWTH 5 DAYS Performed at [facility], [address] [place], [place] [address]    Report Status 04/13/2017 FINAL  Final  Culture, blood (Routine x 2)     Status: None   Collection Time: 04/08/17 10:26 AM  Result Value Ref Range Status   Specimen Description BLOOD BLOOD LEFT FOREARM  Final   Special Requests   Final    BOTTLES DRAWN AEROBIC AND ANAEROBIC Blood Culture results may not be optimal due to an excessive volume of blood received in culture bottles   Culture   Final    NO GROWTH 5 DAYS Performed at [facility], [address] [place], [place] [address]    Report Status 04/13/2017 FINAL  Final    Studies/Results: Dg Chest Port 1 View  Result Date: 04/14/2017 CLINICAL DATA:  Non smoker, neph tube(s) placed yesterday, now with SOBPer chart: a known history of CAD, peripheral vascular disease, diabetes, hypertension, influenza a and hyperlipidemia admitted for abdominal pain and found to have incidental advanced metastatic disease EXAM: PORTABLE CHEST 1 VIEW COMPARISON:  04/08/2017 FINDINGS: Cardiac silhouette is normal in size. No mediastinal or hilar masses. No evidence of adenopathy. Clear lungs.  No pleural effusion or pneumothorax. Skeletal structures are grossly intact. IMPRESSION: No active disease. Electronically Signed   By: Shanon Brow  Ormond M.D.   On: 04/14/2017 15:30    Assessment/Plan: 1. Urinary retention-secondary to bladder neck contracture/pelvic mass. Bladder neck dilated and Foley catheter placed in the operating room. Maintain Foley. Flush as needed.  2. Bilateral hydronephrosis, acute kidney injury-creatinine improving following left-sided stent placement and bladder decompression. Unable to identify right ureteral orifice likely secondary to obstructing tumor. S/p Right PCNx 04/13/2017.   3.metastatic prostate cancer - PSA mildly elevated to 9. Rectal exam was grossly abnormal with firm nodular prostate as well as a mass extending for the prostate. Cystoscopy reveals primarily submucosal mass  with mass-effect into the bladder other than necrotic material at the bladder neck which was biopsied. Per Dr. Rogue Bussing, received the pathology results from Dr. Alphonzo Severance report]positive for metastatic prostate cancer. Pt started on bicalutamide 04/14/2017 (no degarelix available in hospital). Pt will f/u with Dr. Rogue Bussing for ADT and to consider chemo.   Will sign off. F/u on chart. D/c with foley and right nephrostomy to gravity. Call GU with questions, concerns or changes in pt status.     LOS: 4 days   Festus Aloe 04/15/2017, 4:24 PM

## 2017-04-15 NOTE — Progress Notes (Signed)
Discussed with Dr.Hirschman. Hospital does not have Degarelix in stock. Advise patient to start with casodex 50mg  daily and he maybe discharged. He should follow up with Dr.Brahmanday next week to start leupron.

## 2017-04-15 NOTE — Progress Notes (Signed)
Michael [name]   DOB:11/24/1933   HC#:[id number]    Subjective: Patient is resting the bed.  Daughter is at bedside.  Patient reports feeling a lot better. Denies pain.    ROS: No chest pain.  No headaches, denies any pain today..  Complete 10 point review system is done which is negative except mentioned above.  Objective:  Vitals:   04/15/17 0525 04/15/17 0728  BP: 140/79   Pulse: (!) 108   Resp: 18   Temp: 98 F (36.7 C)   SpO2: 98% 94%     Intake/Output Summary (Last 24 hours) at 04/15/2017 1644 Last data filed at 04/15/2017 0513 Gross per 24 hour  Intake -  Output 2300 ml  Net -2300 ml    GENERAL Alert, no distress and comfortable.  EYES: no pallor or icterus OROPHARYNX: no thrush or ulceration. NECK: supple, no masses felt LYMPH:  no palpable lymphadenopathy in the cervical, axillary or inguinal regions LUNGS: decreased breath sounds to auscultation at bases and  No wheeze or crackles HEART/CVS: regular rate & rhythm and no murmurs; No lower extremity edema ABDOMEN: abdomen soft, tender  on deep palpation. and normal bowel sounds; Foley catheter draining pinkish urine.  Positive for nephrostomy bag Musculoskeletal:no cyanosis of digits and no clubbing  PSYCH: alert & oriented x 3 with fluent speech NEURO: no focal motor/sensory deficits SKIN:  no rashes or significant lesions   Labs:  Lab Results  Component Value Date   WBC 6.0 04/13/2017   HGB 9.3 (L) 04/13/2017   HCT 28.6 (L) 04/13/2017   MCV 83.3 04/13/2017   PLT 259 04/13/2017    Lab Results  Component Value Date   NA 135 04/13/2017   K 3.5 04/13/2017   CL 104 04/13/2017   CO2 22 04/13/2017    Studies:  Dg Chest Port 1 View  Result Date: 04/14/2017 CLINICAL DATA:  Non smoker, neph tube(s) placed yesterday, now with SOBPer chart: a known history of CAD, peripheral vascular disease, diabetes, hypertension, influenza a and hyperlipidemia admitted for abdominal pain and found to have incidental  advanced metastatic disease EXAM: PORTABLE CHEST 1 VIEW COMPARISON:  04/08/2017 FINDINGS: Cardiac silhouette is normal in size. No mediastinal or hilar masses. No evidence of adenopathy. Clear lungs.  No pleural effusion or pneumothorax. Skeletal structures are grossly intact. IMPRESSION: No active disease. Electronically Signed   By: Lajean Manes M.D.   On: 04/14/2017 15:30    Assessment & Plan:  84 year old male patient is currently up to the hospital for difficulty with urination/weight loss-CT scan shows pelvic lesions concerning for malignancy  # Metastatic disease in the pelvis-involving bladder and rectum/prostate-status post cystoscopy and biopsy.  Covering Dr.Brahmanday on weekend. Based on my conversation with Dr.Brahmanday, Dr.Rubinas have discussed with him and patient's daughter that patient has metastatic prostate cancer. (See Dr.Brahmanday's note and my consult note on 04/14/2017).  Plan to give Dagarilex however, hospital does not have in stock. Discussed with daughter and patient will start with Casodex 50mg  daily followed by outpatient Lupron shots. Discussed with Dr.Pangallo. He will consult social service for coverage assistance. Side effects profile was discussed with patient and daughter.  # Acute retention of urine/status post Foley catheter; status post left-sided stenting; status post right-sided nephrostomy, defer urology for management.   #Anemia-likely secondary to anemia of chronic disease, stable continue to monitor.   Patient could follow-up outpatient with Dr.Brahmanday next week to be started on Lupron injection.    Earlie Server, MD, PhD Hematology Oncology [facility]  [place] [facility] [place]- [phone] 04/15/2017

## 2017-04-16 ENCOUNTER — Telehealth: Payer: Self-pay | Admitting: Urology

## 2017-04-16 ENCOUNTER — Other Ambulatory Visit: Payer: Self-pay

## 2017-04-16 ENCOUNTER — Other Ambulatory Visit: Payer: Self-pay | Admitting: Internal Medicine

## 2017-04-16 ENCOUNTER — Telehealth: Payer: Self-pay | Admitting: Internal Medicine

## 2017-04-16 ENCOUNTER — Other Ambulatory Visit: Payer: Self-pay | Admitting: Pathology

## 2017-04-16 DIAGNOSIS — C61 Malignant neoplasm of prostate: Secondary | ICD-10-CM

## 2017-04-16 LAB — SURGICAL PATHOLOGY

## 2017-04-16 LAB — GLUCOSE, CAPILLARY: Glucose-Capillary: 166 mg/dL — ABNORMAL HIGH (ref 65–99)

## 2017-04-16 MED ORDER — GUAIFENESIN ER 600 MG PO TB12: 600.0000 mg | ORAL_TABLET | Freq: Two times a day (BID) | ORAL | 0 refills | Status: AC

## 2017-04-16 MED ORDER — GLUCOSE BLOOD VI STRP: 1.0000 | ORAL_STRIP | Freq: Two times a day (BID) | 3 refills | Status: DC | PRN

## 2017-04-16 MED ORDER — BICALUTAMIDE 50 MG PO TABS: 50.0000 mg | ORAL_TABLET | Freq: Every day | ORAL | 0 refills | Status: DC

## 2017-04-16 MED ORDER — INSULIN GLARGINE 100 UNIT/ML ~~LOC~~ SOLN: SUBCUTANEOUS | 3 refills | Status: DC

## 2017-04-16 MED ORDER — ALBUTEROL SULFATE HFA 108 (90 BASE) MCG/ACT IN AERS: 2.0000 | INHALATION_SPRAY | Freq: Four times a day (QID) | RESPIRATORY_TRACT | 2 refills | Status: DC | PRN

## 2017-04-16 MED ORDER — SALIVASURE MT LOZG: 1.0000 | LOZENGE | Freq: Three times a day (TID) | OROMUCOSAL | 2 refills | Status: DC

## 2017-04-16 MED ORDER — GUAIFENESIN-DM 100-10 MG/5ML PO SYRP: 15.0000 mL | ORAL_SOLUTION | ORAL | 0 refills | Status: DC | PRN

## 2017-04-16 NOTE — Telephone Encounter (Signed)
App made ° ° °Michael [name]  °

## 2017-04-16 NOTE — Addendum Note (Signed)
Addended by: Sabino Gasser on: 04/16/2017 11:23 AM   Modules accepted: Orders

## 2017-04-16 NOTE — Progress Notes (Signed)
Pt is being discharged today, discharge instructions reviewed with the patient and his daughter, both verified understanding. All belongings packed and returned to him. 0 paper prescriptions were given to him. He was rolled out in a wheelchair by staff.

## 2017-04-16 NOTE — Telephone Encounter (Signed)
Spoke to daughter- hina; will plan degarelix tomorrow [pending insurance approval]; will plan to see me Friday; some will call to confirm the appts.

## 2017-04-16 NOTE — Progress Notes (Signed)
Spoke to pharmacy; will plan degarelix for tomorrow; will speak to pt/ daughter; and Dr.Labus.   Please schedule for Degarelix for tomorrow- 2/26  Also schedule  Follow up with me on march 1st/ 8:45- labs- CBC/cmp.

## 2017-04-16 NOTE — Discharge Instructions (Signed)
Percutaneous Nephrostomy Home Guide Percutaneous nephrostomy is a procedure to insert a flexible tube into your kidney so that urine can leave your body. This procedure may be done if a medical condition prevents urine from leaving your kidney in the usual way. During the procedure, the nephrostomy tube is inserted in the right or left side of your lower back and is connected to an external drainage bag. After you have a nephrostomy tube placed, urine will collect in the drainage bag outside of your body. You will need to empty and change the drainage bag as needed. You will also need to take steps to care for the area where the nephrostomy tube was inserted (tube insertion site). How do I care for my nephrostomy tube?  Always keep your tubing, the leg bag, or the bedside drainage bag below the level of your kidney so that your urine drains freely.  Avoid activities that would cause bending or pulling of your tubing. Ask your health care provider what activities are safe for you.  When connecting your nephrostomy tube to a drainage bag, make sure that there are no kinks in the tubing and that your urine is draining freely. You may want to gently wrap an elastic bandage over the tubing. This will help keep the tubing in place and prevent it from kinking. Make sure there is no tension on the tubing so it does not become dislodged.  At night, you may want to connect your nephrostomy tube or the leg bag to a larger bedside drainage bag. How do I empty the drainage bag? Empty the leg bag or bedside drainage bag whenever it becomes ? full. Also empty it before you go to sleep. Most drainage bags have a drain at the bottom that allows urine to be emptied. Follow these basic steps: 1. Hold the drainage bag over a toilet or collection container. Use a measuring container if your health care provider told you to measure your urine. 2. Open the drain of the bag and allow the urine to drain out. 3. After all the  urine has drained from the drainage bag, close the drain fully. 4. Flush the urine down the toilet. If a collection container was used, rinse the container.  How do I change the dressing around the nephrostomy tube? Change your dressing and clean your tube exit site as told by your health care provider. You may need to change the dressing every day for the first 2 weeks after having a nephrostomy tube inserted. After the first 2 weeks, you may be told to change the dressing two times a week. Supplies needed:  Mild soap and water.  Split gauze pads, 4  4 inches (10 x 10 cm).  Gauze pads, 4  4 inches (10 x 10 cm).  Paper tape. How to change the dressing: Because of the location of your nephrostomy tube, you may need help from another person to complete dressing changes. Follow these basic steps: 1. Wash hands with soap and water. 2. Gently remove the tape and dressing from around the nephrostomy tube. Be careful not to pull on the tube while removing the dressing. Avoid using scissors because they may damage the tube. 3. Wash the skin around the tube with mild soap and water, rinse well, and pat the skin dry with a clean cloth. 4. Check the skin around the drain for redness, swelling, pus, warmth, or a bad smell. 5. If the drain was sutured to the skin, check the suture to  verify that it is still anchored in the skin. 6. Place two split gauze pads in and around the tube exit site. Do not apply ointments or alcohol to the site. 7. Place a gauze pad on top of the split gauze pad. 8. Coil the tube on top of the gauze. The tubing should rest on the gauze, not on the skin. 9. Place tape around each edge of the gauze pad. 10. Secure the nephrostomy tubing. Make sure that the tube does not kink or become pinched. The tubing should rest on the gauze pad, not on the skin. 11. Dispose of used supplies properly.  How do I flush my nephrostomy tube? Use a saline syringe to rinse out (flush) your  nephrostomy tube as told by your health care provider. Flushing is easier if a three-way stopcock is placed between the tube and the drainage bag. One connection of the stopcock connects to your tube, the second connects to the drainage bag, and the third is usually covered with a cap. The lever on the stopcock points to the direction on the stopcock that is closed to flow. Normally, the lever points in the direction of the cap to allow urine to drain from the tube to the drainage bag. Supplies needed:  Rubbing alcohol wipe.  10 mL 0.9% saline syringe. How to flush the tube: 1. Move the lever of the three-way stopcock so it points toward the drainage bag. 2. Clean the cap with a rubbing alcohol wipe. 3. Screw the tip of a 10 mL 0.9% saline syringe onto the cap. 4. Using the syringe plunger, slowly push the 10 mL 0.9% saline in the syringe over 5-10 seconds. If resistance is met or pain occurs while pushing, stop pushing the saline. 5. Remove the syringe from the cap. 6. Return the stopcock lever to the usual position, pointing in the direction of the cap. 7. Dispose of used supplies properly. How do I replace the drainage bag? Replace the drainage bag, three-way stopcock, and any extension tubing as told by your health care provider. Make sure you always have an extra drainage bag and connecting tubing available. 1. Empty urine from your drainage bag. 2. Gather a new drainage bag, three-way stopcock, and any extension tubing. 3. Remove the drainage bag, three-way stopcock, and any extension tubing from the nephrostomy tube. 4. Attach the new leg bag or bedside drainage bag, three-way stopcock, and any extension tubing to the nephrostomy tube. 5. Dispose of the used drainage bag, three-way stopcock, and any extension.  Contact a health care provider if:  You have problems with any of the valves or tubing.  You have persistent pain or soreness in your back.  You have more redness, swelling,  or pain around your tube insertion site.  You have more fluid or blood coming from your tube insertion site.  Your tube insertion site feels warm to the touch.  You have pus or a bad smell coming from your tube insertion site.  You have increased urine output or you feel burning when urinating. Get help right away if:  You have pain in your abdomen during the first week.  You have chest pain or have trouble breathing.  You have a new appearance of blood in your urine.  You have a fever or chills.  You have back pain that is not relieved by your medicine.  You have decreased urine output.  Your nephrostomy tube comes out.   Indwelling Urinary Catheter Care, Adult Take good care  of your catheter to keep it working and to prevent problems. How to wear your catheter Attach your catheter to your leg with tape (adhesive tape) or a leg strap. Make sure it is not too tight. If you use tape, remove any bits of tape that are already on the catheter. How to wear a drainage bag You should have:  A large overnight bag.  A small leg bag.  Overnight Bag You may wear the overnight bag at any time. Always keep the bag below the level of your bladder but off the floor. When you sleep, put a clean plastic bag in a wastebasket. Then hang the bag inside the wastebasket. Leg Bag Never wear the leg bag at night. Always wear the leg bag below your knee. Keep the leg bag secure with a leg strap or tape. How to care for your skin  Clean the skin around the catheter at least once every day.  Shower every day. Do not take baths.  Put creams, lotions, or ointments on your genital area only as told by your doctor.  Do not use powders, sprays, or lotions on your genital area. How to clean your catheter and your skin 1. Wash your hands with soap and water. 2. Wet a washcloth in warm water and gentle (mild) soap. 3. Use the washcloth to clean the skin where the catheter enters your body. Clean  downward and wipe away from the catheter in small circles. Do not wipe toward the catheter. 4. Pat the area dry with a clean towel. Make sure to clean off all soap. How to care for your drainage bags Empty your drainage bag when it is ?- full or at least 2-3 times a day. Replace your drainage bag once a month or sooner if it starts to smell bad or look dirty. Do not clean your drainage bag unless told by your doctor. Emptying a drainage bag  Supplies Needed  Rubbing alcohol.  Gauze pad or cotton ball.  Tape or a leg strap.  Steps 1. Wash your hands with soap and water. 2. Separate (detach) the bag from your leg. 3. Hold the bag over the toilet or a clean container. Keep the bag below your hips and bladder. This stops pee (urine) from going back into the tube. 4. Open the pour spout at the bottom of the bag. 5. Empty the pee into the toilet or container. Do not let the pour spout touch any surface. 6. Put rubbing alcohol on a gauze pad or cotton ball. 7. Use the gauze pad or cotton ball to clean the pour spout. 8. Close the pour spout. 9. Attach the bag to your leg with tape or a leg strap. 10. Wash your hands.  Changing a drainage bag Supplies Needed  Alcohol wipes.  A clean drainage bag.  Adhesive tape or a leg strap.  Steps 1. Wash your hands with soap and water. 2. Separate the dirty bag from your leg. 3. Pinch the rubber catheter with your fingers so that pee does not spill out. 4. Separate the catheter tube from the drainage tube where these tubes connect (at the connection valve). Do not let the tubes touch any surface. 5. Clean the end of the catheter tube with an alcohol wipe. Use a different alcohol wipe to clean the end of the drainage tube. 6. Connect the catheter tube to the drainage tube of the clean bag. 7. Attach the new bag to the leg with adhesive tape or a leg  strap. 8. Wash your hands.  How to prevent infection and other problems  Never pull on your  catheter or try to remove it. Pulling can damage tissue in your body.  Always wash your hands before and after touching your catheter.  If a leg strap gets wet, replace it with a dry one.  Drink enough fluids to keep your pee clear or pale yellow, or as told by your doctor.  Do not let the drainage bag or tubing touch the floor.  Wear cotton underwear.  If you are male, wipe from front to back after you poop (have a bowel movement).  Check on the catheter often to make sure it works and the tubing is not twisted. Get help if:  Your pee is cloudy.  Your pee smells unusually bad.  Your pee is not draining into the bag.  Your tube gets clogged.  Your catheter starts to leak.  Your bladder feels full. Get help right away if:  You have redness, swelling, or pain where the catheter enters your body.  You have fluid, pus, or a bad smell coming from the area where the catheter enters your body.  The area where the catheter enters your body feels warm.  You have a fever.  You have pain in your: ? Stomach (abdomen). ? Legs. ? Lower back. ? Bladder.  You see blood fill the catheter.  Your pee is pink or red.  You feel sick to your stomach (nauseous).  You throw up (vomit).  You have chills.  Your catheter gets pulled out. This information is not intended to replace advice given to you by your health care provider. Make sure you discuss any questions you have with your health care provider. Document Released: 06/03/2012 Document Revised: 01/05/2016 Document Reviewed: 07/22/2013 Elsevier Interactive Patient Education  2018 [facility] [place]:  Diabetic diet  DISCHARGE CONDITION:  Stable  ACTIVITY:  Activity as tolerated  OXYGEN:  Home Oxygen: No.   Oxygen Delivery: room air  DISCHARGE LOCATION:  home    ADDITIONAL DISCHARGE INSTRUCTION:   If you experience worsening of your admission  symptoms, develop shortness of breath, life threatening emergency, suicidal or homicidal thoughts you must seek medical attention immediately by calling 911 or calling your MD immediately  if symptoms less severe.  You Must read complete instructions/literature along with all the possible adverse reactions/side effects for all the Medicines you take and that have been prescribed to you. Take any new Medicines after you have completely understood and accpet all the possible adverse reactions/side effects.   Please note  You were cared for by a hospitalist during your hospital stay. If you have any questions about your discharge medications or the care you received while you were in the hospital after you are discharged, you can call the unit and asked to speak with the hospitalist on call if the hospitalist that took care of you is not available. Once you are discharged, your primary care physician will handle any further medical issues. Please note that NO REFILLS for any discharge medications will be authorized once you are discharged, as it is imperative that you return to your primary care physician (or establish a relationship with a primary care physician if you do not have one) for your aftercare needs so that they can reassess your need for medications and monitor your lab values.

## 2017-04-16 NOTE — Progress Notes (Signed)
msg sent to scheduling to add pt to schedule per md order

## 2017-04-16 NOTE — Anesthesia Postprocedure Evaluation (Signed)
Anesthesia Post Note  Patient: Michael [name]  Procedure(s) Performed: CYSTOSCOPY WITH STENT PLACEMENT and fulgeration (Left Ureter) CYSTOSCOPY WITH RETROGRADE PYELOGRAM (Bilateral Ureter) CYSTOSCOPY WITH URETHRAL DILATATION (Bilateral Ureter)  Patient location during evaluation: PACU Anesthesia Type: General Level of consciousness: awake and alert Pain management: pain level controlled Vital Signs Assessment: post-procedure vital signs reviewed and stable Respiratory status: spontaneous breathing, nonlabored ventilation, respiratory function stable and patient connected to nasal cannula oxygen Cardiovascular status: blood pressure returned to baseline and stable Postop Assessment: no apparent nausea or vomiting Anesthetic complications: no     Last Vitals:  Vitals:   04/15/17 1935 04/16/17 0411  BP: (!) 143/93 (!) 146/68  Pulse: (!) 119 (!) 108  Resp: 18 16  Temp: 36.7 C 36.8 C  SpO2: 99% 99%    Last Pain:  Vitals:   04/15/17 1935  TempSrc: Oral  PainSc: 0-No pain                 Molli Barrows

## 2017-04-16 NOTE — Care Management Important Message (Signed)
Important Message  Patient Details  Name: Michael [name] MRN: [id number] Date of Birth: [birth year]   Medicare Important Message Given:  Yes    Beverly Sessions, RN 04/16/2017, 11:14 AM

## 2017-04-16 NOTE — Progress Notes (Signed)
Michael [name]   DOB:18-Jan-1934   HY#:[id number]    Subjective: Patient is resting the bed.  He is sitting with edge of the bed.  Denies any pain.  He had moved his bowels.  He had a breathing treatment-breathing is better.  Complains of pinkish tinged urine in the Foley bag.  ROS: No chest pain.  No headaches.  Complete 10 point review system is done which is negative except mentioned above.  Objective:  Vitals:   04/15/17 1935 04/16/17 0411  BP: (!) 143/93 (!) 146/68  Pulse: (!) 119 (!) 108  Resp: 18 16  Temp: 98 F (36.7 C) 98.3 F (36.8 C)  SpO2: 99% 99%     Intake/Output Summary (Last 24 hours) at 04/16/2017 1345 Last data filed at 04/16/2017 2952 Gross per 24 hour  Intake 7170 ml  Output 1950 ml  Net 5220 ml    GENERAL Alert, no distress and comfortable.  EYES: no pallor or icterus OROPHARYNX: no thrush or ulceration. NECK: supple, no masses felt LYMPH:  no palpable lymphadenopathy in the cervical, axillary or inguinal regions LUNGS: decreased breath sounds to auscultation at bases and  No wheeze or crackles HEART/CVS: regular rate & rhythm and no murmurs; No lower extremity edema ABDOMEN: abdomen soft, tender  on deep palpation. and normal bowel sounds; Foley catheter draining pinkish urine.  Positive for nephrostomy bag Musculoskeletal:no cyanosis of digits and no clubbing  PSYCH: alert & oriented x 3 with fluent speech NEURO: no focal motor/sensory deficits SKIN:  no rashes or significant lesions   Labs:  Lab Results  Component Value Date   WBC 6.0 04/13/2017   HGB 9.3 (L) 04/13/2017   HCT 28.6 (L) 04/13/2017   MCV 83.3 04/13/2017   PLT 259 04/13/2017    Lab Results  Component Value Date   NA 135 04/13/2017   K 3.5 04/13/2017   CL 104 04/13/2017   CO2 22 04/13/2017    Studies:  Dg Chest Port 1 View  Result Date: 04/14/2017 CLINICAL DATA:  Non smoker, neph tube(s) placed yesterday, now with SOBPer chart: a known history of CAD, peripheral vascular  disease, diabetes, hypertension, influenza a and hyperlipidemia admitted for abdominal pain and found to have incidental advanced metastatic disease EXAM: PORTABLE CHEST 1 VIEW COMPARISON:  04/08/2017 FINDINGS: Cardiac silhouette is normal in size. No mediastinal or hilar masses. No evidence of adenopathy. Clear lungs.  No pleural effusion or pneumothorax. Skeletal structures are grossly intact. IMPRESSION: No active disease. Electronically Signed   By: Lajean Manes M.D.   On: 04/14/2017 15:30    Assessment & Plan:  84 year old male patient is currently up to the hospital for difficulty with urination/weight loss-CT scan shows pelvic lesions concerning for malignancy  # Metastatic disease in the pelvis-involving bladder and rectum/prostate-status post cystoscopy and biopsy.  Discussed with Dr. Reuel Derby; positive for prostate cancer.  Patient is currently on Casodex 50 mg a day.  Awaiting to get started on degarelix; unavailable in the hospital.  Discussed with outpatient pharmacy.  Plan to proceed with degarelix 2/26 pending insurance.  # Acute retention of urine/status post Foley catheter; status post left-sided stenting; status post right-sided nephrostomy.  #Anemia-likely secondary to anemia of chronic disease  # Patient could be discharged from oncology standpoint. Follow-up with me on March 1/labs.  Discussed above plan with the daughter.  Also reviewed with Dr. Posey Pronto.  [name], MD

## 2017-04-16 NOTE — Care Management Note (Signed)
Case Management Note  Patient Details  Name: Michael [name] MRN: [id number] Date of Birth: [birth year]    RNCM spoke with daughter Hina.  Confirmed that hospital bed has been delivered. RW to be delivered prior to discharge.  Daughter to transport home.  Corene Cornea with [facility] notified of discharge.  RNCM signing off.   Subjective/Objective:                    Action/Plan:   Expected Discharge Date:  04/16/17               Expected Discharge Plan:  [place]  In-House Referral:     Discharge planning Services  CM Consult  Post Acute Care Choice:    Choice offered to:  Adult Children  DME Arranged:  [facility] rolling, Hospital bed DME Agency:  [facility] Arranged:  RN, PT, Nurse's Aide [facility] Agency:  [facility]  Status of Service:  Completed, signed off  If discussed at [place] of Stay Meetings, dates discussed:    Additional Comments:  Beverly Sessions, RN 04/16/2017, 12:19 PM

## 2017-04-16 NOTE — Discharge Summary (Signed)
[facility] [place] [facility], 84 y.o., DOB August 25, 1933, MRN [id number]. Admission date: 04/11/2017 Discharge Date 04/16/2017 Primary MD [name], MD Admitting Physician Loletha Grayer, MD  Admission Diagnosis  Flu  Discharge Diagnosis   Active Problems:   Abdominal pain due to underlying malignancy with constipation Acute kidney injury with bilateral hydronephrosis status post right-sided nephrostomy tube Urinary retention-secondary to bladder neck contracture/pelvic mass. Acute bronchitis Hyperlipidemia Anemia of chronic disease Essential hypertension BPH Influenza A Type 2 diabetes  Hospital Course  84 y.o.malewith a known history of CAD, peripheral vascular disease, diabetes, hypertension, influenza a and hyperlipidemia admitted for abdominal pain and found to have incidental advanced metastatic disease related to prostate cancer.  Patient was noted to have bilateral hydronephrosis.  He had a Foley placed.  He underwent a cystoscopy pathology results were noted for metastatic prostate cancer.  He was able to have a left-sided stent placed however urology was unable to do anything to the right side.  Therefore he had a right-sided nephrostomy tube placed by radiology.  Patient's renal function is now normal.  He still has Foley in place.  He will need a urology follow-up.  He was seen by oncology who has started him on treatment for prostate cancer.               Consults  urology, oncology Significant Tests:  See full reports for all details     Dg Chest 2 View  Result Date: 04/08/2017 CLINICAL DATA:  Left flank pain radiates to bladder. EXAM: CHEST  2 VIEW COMPARISON:  12/14/2016 FINDINGS: The lungs are clear without focal pneumonia, edema, pneumothorax or pleural effusion. The cardiopericardial silhouette is within normal limits for size. The visualized bony structures of the thorax are intact. Telemetry leads overlie the  chest. IMPRESSION: No active cardiopulmonary disease. Electronically Signed   By: Misty Stanley M.D.   On: 04/08/2017 10:57   [place] Intraoperative  Result Date: 04/13/2017 CLINICAL DATA:  Ultrasound was provided for use by the ordering physician, and a technical charge was applied by the performing facility.  No radiologist interpretation/professional services rendered.   Ct Abdomen Pelvis W Contrast  Result Date: 04/11/2017 CLINICAL DATA:  Abdominal pain with radiation to his back. Symptoms for 6 weeks. Dysuria. Chronic diarrhea. Recently diagnosed with flu. Anemic. Prior prostatectomy per report. EXAM: CT ABDOMEN AND PELVIS WITH CONTRAST TECHNIQUE: Multidetector CT imaging of the abdomen and pelvis was performed using the standard protocol following bolus administration of intravenous contrast. CONTRAST:  38mL ISOVUE-300 IOPAMIDOL (ISOVUE-300) INJECTION 61% COMPARISON:  Chest CT 03/01/2015. No prior dedicated abdominopelvic imaging. FINDINGS: Lower chest: Right middle lobe pulmonary nodule of 6 mm on image 3/4. Suspect centrilobular emphysema. Normal heart size with tiny left pleural effusion. Right coronary artery atherosclerosis. Retrocrural adenopathy at 10 mm on image 15/2. Hepatobiliary: Subtle irregular hepatic capsule may be artifactual and related to CT reconstruction technique. No focal liver lesion. Normal gallbladder, without biliary ductal dilatation. Pancreas: Normal, without mass or ductal dilatation. Spleen: Normal in size, without focal abnormality. Adrenals/Urinary Tract: Mild right adrenal thickening. Normal left adrenal gland. Too small to characterize interpolar right renal lesion is most likely a cyst. Moderate bilateral hydroureteronephrosis. This is slightly worse on the left, with marked left perinephric interstitial thickening, possibly related to forniceal rupture. Hydroureter is followed only to the upper abdomen, likely obstructed by retroperitoneal adenopathy. Bladder tumor is  eccentric right, including on image 62/2. There is more inferior masslike right  bladder placed tumor including at 4.4 x 3.5 cm on image 67/2. Stomach/Bowel: Proximal gastric underdistention. Rectal wall thickening is contiguous with bladder and prostatic soft tissue fullness, including on image 71/series 2. There is a large amount of colonic stool within the more proximal colon, suggesting a component of low-grade obstruction. Normal terminal ileum. Normal small bowel. Vascular/Lymphatic: Advanced aortic and branch vessel atherosclerosis. Bulky retroperitoneal adenopathy. Left periaortic nodal tissue surrounds the aorta and measures 5.0 x 3.6 cm on image 39/2. Marked pelvic sidewall adenopathy, including a right external iliac nodal mass of 3.9 x 5.4 cm on image 57/2. Reproductive: Despite the clinical history, the prostate appears present including on image 72/series 2. Soft tissue is positioned between the posterior right bladder wall and rectum, contiguous with the superior aspect of the prostate and right seminal vesicle. Example image 66/2. Other: Perirectal edema. No abdominopelvic ascites. Tiny fat containing left inguinal hernia. Musculoskeletal: Bilateral L5 pars defects with grade 2 L5-S1 anterolisthesis. Heterogeneous marrow density, including in the right-side of the L1 vertebral body on image 47/6. IMPRESSION: 1. 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 Infiltrative soft tissue involves the bladder, rectum, and superior aspect of the prostate. Bladder primary is slightly favored. Prostatic or rectal primary is felt less likely. Given the clinical history and appearance of the rectum, fistulous communication between bladder and rectum cannot be excluded. No gas within the bladder to confirm fistula. 2. Suspect a component of bowel obstruction at the level of rectal involvement. 3. Extensive nodal metastasis within the abdomen and retrocrural space of the lower chest. 4. Bilateral  hydronephrosis, likely related to retroperitoneal adenopathy. Extensive left perirenal edema, suggesting forniceal rupture. 5. Tiny left pleural effusion. 6. Coronary artery atherosclerosis. Aortic Atherosclerosis (ICD10-I70.0). 7. Suspect osseous metastasis, most apparent at L1. 8. Nonspecific right middle lobe pulmonary nodule. These results will be called to the ordering clinician or representative by the Radiologist Assistant, and communication documented in the PACS or zVision Dashboard. Electronically Signed   By: Abigail Miyamoto M.D.   On: 04/11/2017 16:00   US Renal  Result Date: 04/12/2017 CLINICAL DATA:  History of metastatic disease from primary pelvic lesion, hydronephrosis EXAM: RENAL / URINARY TRACT ULTRASOUND COMPLETE COMPARISON:  CT abdomen pelvis of 04/19/2017 FINDINGS: Right Kidney: Length: 10.0 cm. Moderate hydronephrosis is present. The renal parenchyma echogenicity is unremarkable. Left Kidney: Length: 10.1 cm. No hydronephrosis is currently seen. The echogenicity of the renal parenchyma is within normal limits. Bladder: The urinary bladder is decompressed with Foley catheter present. Urinary bladder wall does appear to be diffusely thickened. IMPRESSION: 1. Right-sided moderate hydronephrosis.  No left hydronephrosis. 2. The urinary bladder is decompressed but does appear thick walled. Electronically Signed   By: Ivar Drape M.D.   On: 04/12/2017 17:13   Dg Chest Port 1 View  Result Date: 04/14/2017 CLINICAL DATA:  Non smoker, neph tube(s) placed yesterday, now with SOBPer chart: a known history of CAD, peripheral vascular disease, diabetes, hypertension, influenza a and hyperlipidemia admitted for abdominal pain and found to have incidental advanced metastatic disease EXAM: PORTABLE CHEST 1 VIEW COMPARISON:  04/08/2017 FINDINGS: Cardiac silhouette is normal in size. No mediastinal or hilar masses. No evidence of adenopathy. Clear lungs.  No pleural effusion or pneumothorax. Skeletal  structures are grossly intact. IMPRESSION: No active disease. Electronically Signed   By: Lajean Manes M.D.   On: 04/14/2017 15:30   Ir Nephrostomy Placement Right  Result Date: 04/13/2017 INDICATION: History of metastatic prostate cancer, now with bilateral obstructive uropathy.  Patient was able to undergo successful left-sided retrograde ureteral stent placement however failed attempted right-sided retrograde stent placement secondary to nonvisualization of the right urinary bladder trigone. As such, request made for image guided placement of a right-sided percutaneous nephrostomy catheter. EXAM: 1. ULTRASOUND GUIDANCE FOR PUNCTURE OF THE RIGHT RENAL COLLECTING SYSTEM 2. RIGHT PERCUTANEOUS NEPHROSTOMY TUBE PLACEMENT. COMPARISON:  CT abdomen pelvis - 04/11/2017; renal ultrasound - 04/12/2017 MEDICATIONS: Ciprofloxacin 400 mg IV; the antibiotic was administered in an appropriate time frame prior to skin puncture. ANESTHESIA/SEDATION: Moderate (conscious) sedation was employed during this procedure. A total of Versed 2 mg and Fentanyl 75 mcg was administered intravenously. Moderate Sedation Time: 16 minutes. The patient's level of consciousness and vital signs were monitored continuously by radiology nursing throughout the procedure under my direct supervision. CONTRAST:  20 mL Isovue 300 administered into the collecting system FLUOROSCOPY TIME:  1 minute 36 seconds (161 mGy) COMPLICATIONS: None immediate. PROCEDURE: The procedure, risks, benefits, and alternatives were explained to the patient. Questions regarding the procedure were encouraged and answered. The patient understands and consents to the procedure. A timeout was performed prior to the initiation of the procedure. The right flank region was prepped with Betadine in a sterile fashion, and a sterile drape was applied covering the operative field. A sterile gown and sterile gloves were used for the procedure. Local anesthesia was provided with 1%  Lidocaine with epinephrine. Ultrasound was used to localize the right kidney. Under direct ultrasound guidance, a 21 gauge needle was advanced into the renal collecting system. An ultrasound image documentation was performed. Access within the collecting system was confirmed with the efflux of urine followed by contrast injection. Over a Nitrex wire, the inner three [place] catheter of an Accustick set was advanced into the renal collecting system. Contrast injection was injected into the collecting system as several spot radiographs were obtained in various obliquities confirming puncture within a posterior inferior calix. As such, the tract was dilated with an Accustick stent. Over a guide wire, a 10-French percutaneous nephrostomy catheter was advanced into the collecting system where the coil was formed and locked. Contrast was injected and several sport radiographs were obtained in various obliquities confirming access. The catheter was secured at the skin with a Prolene retention suture and a gravity bag was placed. A dressing was placed. The patient tolerated procedure well without immediate postprocedural complication. FINDINGS: Ultrasound scanning demonstrates a moderate dilated right collecting system. Under direct ultrasound guidance, a posterior inferior calix was targeted allowing advancement of an 10-French percutaneous nephrostomy catheter under intermittent fluoroscopic guidance. Contrast injection confirmed appropriate positioning. IMPRESSION: Successful ultrasound and fluoroscopic guided placement of a right sided 10 French PCN. Electronically Signed   By: Sandi Mariscal M.D.   On: 04/13/2017 13:33       Today   Subjective:   Michael [name] patient feeling well denies any complaints  Objective:   Blood pressure (!) 146/68, pulse (!) 108, temperature 98.3 F (36.8 C), resp. rate 16, height 5\' 6"  (1.676 m), weight 129 lb (58.5 kg), SpO2 99 %.  .  Intake/Output Summary (Last 24 hours) at  04/16/2017 1255 Last data filed at 04/16/2017 0626 Gross per 24 hour  Intake 7170 ml  Output 1950 ml  Net 5220 ml    Exam VITAL SIGNS: Blood pressure (!) 146/68, pulse (!) 108, temperature 98.3 F (36.8 C), resp. rate 16, height 5\' 6"  (1.676 m), weight 129 lb (58.5 kg), SpO2 99 %.  GENERAL:  84 y.o.-year-old patient lying in  the bed with no acute distress.  EYES: Pupils equal, round, reactive to light and accommodation. No scleral icterus. Extraocular muscles intact.  HEENT: Head atraumatic, normocephalic. Oropharynx and nasopharynx clear.  NECK:  Supple, no jugular venous distention. No thyroid enlargement, no tenderness.  LUNGS: Normal breath sounds bilaterally, no wheezing, rales,rhonchi or crepitation. No use of accessory muscles of respiration.  CARDIOVASCULAR: S1, S2 normal. No murmurs, rubs, or gallops.  ABDOMEN: Soft, nontender, nondistended. Bowel sounds present. No organomegaly or mass.  EXTREMITIES: No pedal edema, cyanosis, or clubbing.  NEUROLOGIC: Cranial nerves II through XII are intact. Muscle strength 5/5 in all extremities. Sensation intact. Gait not checked.  PSYCHIATRIC: The patient is alert and oriented x 3.  SKIN: No obvious rash, lesion, or ulcer.   Data Review     CBC w Diff:  Lab Results  Component Value Date   WBC 6.0 04/13/2017   HGB 9.3 (L) 04/13/2017   HGB 13.7 05/07/2012   HCT 28.6 (L) 04/13/2017   PLT 259 04/13/2017   CMP:  Lab Results  Component Value Date   NA 135 04/13/2017   NA 136 05/07/2012   K 3.5 04/13/2017   K 4.5 05/07/2012   CL 104 04/13/2017   CL 104 05/07/2012   CO2 22 04/13/2017   CO2 28 05/07/2012   BUN 18 04/13/2017   BUN 16 05/07/2012   CREATININE 1.00 04/13/2017   CREATININE 0.84 05/07/2012   PROT 7.7 04/11/2017   ALBUMIN 3.1 (L) 04/11/2017   BILITOT 0.5 04/11/2017   ALKPHOS 107 04/11/2017   AST 37 04/11/2017   ALT 19 04/11/2017  .  Micro Results Recent Results (from the past 240 hour(s))  Urine C&S      Status: None   Collection Time: 04/08/17 10:16 AM  Result Value Ref Range Status   Specimen Description   Final    URINE, CLEAN CATCH Performed at [facility], [address], [place], [place] [address]    Special Requests   Final    NONE Performed at [facility], [address], [place], [place] [address]    Culture   Final    NO GROWTH Performed at [facility], [place] [address] [place], [place] [address]    Report Status 04/09/2017 FINAL  Final  Culture, blood (Routine x 2)     Status: None   Collection Time: 04/08/17 10:26 AM  Result Value Ref Range Status   Specimen Description BLOOD BLOOD RIGHT ARM  Final   Special Requests   Final    BOTTLES DRAWN AEROBIC AND ANAEROBIC Blood Culture results may not be optimal due to an excessive volume of blood received in culture bottles   Culture   Final    NO GROWTH 5 DAYS Performed at [facility], [address] [place], [place] [address]    Report Status 04/13/2017 FINAL  Final  Culture, blood (Routine x 2)     Status: None   Collection Time: 04/08/17 10:26 AM  Result Value Ref Range Status   Specimen Description BLOOD BLOOD LEFT FOREARM  Final   Special Requests   Final    BOTTLES DRAWN AEROBIC AND ANAEROBIC Blood Culture results may not be optimal due to an excessive volume of blood received in culture bottles   Culture   Final    NO GROWTH 5 DAYS Performed at [facility], [address], [place], [place] [address]    Report Status 04/13/2017 FINAL  Final  Code Status Orders  (From admission, onward)        Start     Ordered   04/11/17 1129  Full code  Continuous     04/11/17 1130    Code Status History    Date Active Date Inactive Code Status Order ID Comments User Context   02/03/2015 09:25 02/03/2015 15:42 Full Code [id number]  Wellington [name], MD Inpatient          Follow-up Information    [name], MD. Go on 05/08/2017.   Specialty:   Urology Why:  at 8:30 a.m. Contact information: [address] [place] [address] [phone]        [name], MD.   Specialties:  Internal Medicine, Oncology Why:  as scheduled Contact information: [address] [place] [address] [phone]           Discharge Medications   Allergies as of 04/16/2017      Reactions   Zosyn [piperacillin Sod-tazobactam So] Swelling   Lips swollen.  Was given at hospital through IV      Medication List    STOP taking these medications   oseltamivir 75 MG capsule Commonly known as:  TAMIFLU     TAKE these medications   albuterol 108 (90 Base) MCG/ACT inhaler Commonly known as:  PROVENTIL HFA;VENTOLIN HFA Inhale 2 puffs into the lungs every 6 (six) hours as needed for wheezing or shortness of breath.   atorvastatin 10 MG tablet Commonly known as:  LIPITOR Take 10 mg by mouth daily.   bicalutamide 50 MG tablet Commonly known as:  CASODEX Take 1 tablet (50 mg total) by mouth daily. Start taking on:  04/17/2017   Calcium-Vitamin D 600-200 MG-UNIT tablet Take by mouth.   cetirizine 10 MG tablet Commonly known as:  ZYRTEC Take 1 tablet (10 mg total) by mouth daily.   finasteride 5 MG tablet Commonly known as:  PROSCAR TAKE 1 TABLET (5 MG TOTAL) BY MOUTH DAILY.   glimepiride 4 MG tablet Commonly known as:  AMARYL Take 1 tablet (4 mg total) by mouth 2 (two) times daily.   GOODSENSE ASPIRIN 325 MG tablet Generic drug:  aspirin Take by mouth.   guaiFENesin 600 MG 12 hr tablet Commonly known as:  MUCINEX Take 1 tablet (600 mg total) by mouth 2 (two) times daily for 7 days.   guaiFENesin-dextromethorphan 100-10 MG/5ML syrup Commonly known as:  ROBITUSSIN DM Take 15 mLs by mouth every 4 (four) hours as needed for cough.   insulin glargine 100 UNIT/ML injection Commonly known as:  LANTUS 7untis sq qhs   lisinopril 2.5 MG tablet Commonly known as:  PRINIVIL,ZESTRIL Take  2.5 mg by mouth daily.   metoprolol succinate 25 MG 24 hr tablet Commonly known as:  TOPROL-XL Take 25 mg by mouth daily.   multivitamin with minerals tablet Take 1 tablet by mouth daily.   omeprazole 20 MG capsule Commonly known as:  PRILOSEC Take 20 mg by mouth daily.   SALIVASURE Lozg Use as directed 1 lozenge in the mouth or throat 3 (three) times daily.            Durable Medical Equipment  (From admission, onward)        Start     Ordered   04/13/17 1428  For home use only DME Hospital bed  Once    Question Answer Comment  Patient has (list medical condition): Cancer, weight loss, pain   The above medical  condition requires: Patient requires the ability to reposition frequently   Head must be elevated greater than: 30 degrees   Bed type Semi-electric      04/13/17 1428         Total Time in preparing paper work, data evaluation and todays exam - 54 minutes  Dustin Flock M.D on 04/16/2017 at 12:55 PM  [facility]   Office  [phone]

## 2017-04-17 ENCOUNTER — Inpatient Hospital Stay: Payer: Medicare Other | Attending: Internal Medicine

## 2017-04-17 ENCOUNTER — Other Ambulatory Visit: Payer: Self-pay

## 2017-04-17 DIAGNOSIS — C61 Malignant neoplasm of prostate: Secondary | ICD-10-CM | POA: Diagnosis not present

## 2017-04-17 DIAGNOSIS — Z79818 Long term (current) use of other agents affecting estrogen receptors and estrogen levels: Secondary | ICD-10-CM | POA: Diagnosis not present

## 2017-04-17 MED ORDER — "INSULIN SYRINGE-NEEDLE U-100 31G X 5/16"" 1 ML MISC": 0 refills | Status: DC

## 2017-04-17 MED ORDER — DEGARELIX ACETATE 120 MG ~~LOC~~ SOLR
240.0000 mg | Freq: Once | SUBCUTANEOUS | Status: AC
  Administered 2017-04-17: 240 mg via SUBCUTANEOUS
  Filled 2017-04-17: qty 6

## 2017-04-18 DIAGNOSIS — N1330 Unspecified hydronephrosis: Secondary | ICD-10-CM | POA: Diagnosis not present

## 2017-04-18 DIAGNOSIS — C61 Malignant neoplasm of prostate: Secondary | ICD-10-CM | POA: Diagnosis not present

## 2017-04-18 DIAGNOSIS — I6523 Occlusion and stenosis of bilateral carotid arteries: Secondary | ICD-10-CM | POA: Diagnosis not present

## 2017-04-18 DIAGNOSIS — N320 Bladder-neck obstruction: Secondary | ICD-10-CM | POA: Diagnosis not present

## 2017-04-18 DIAGNOSIS — I2510 Atherosclerotic heart disease of native coronary artery without angina pectoris: Secondary | ICD-10-CM | POA: Diagnosis not present

## 2017-04-18 DIAGNOSIS — E1151 Type 2 diabetes mellitus with diabetic peripheral angiopathy without gangrene: Secondary | ICD-10-CM | POA: Diagnosis not present

## 2017-04-18 DIAGNOSIS — D638 Anemia in other chronic diseases classified elsewhere: Secondary | ICD-10-CM | POA: Diagnosis not present

## 2017-04-18 DIAGNOSIS — Z466 Encounter for fitting and adjustment of urinary device: Secondary | ICD-10-CM | POA: Diagnosis not present

## 2017-04-18 DIAGNOSIS — I10 Essential (primary) hypertension: Secondary | ICD-10-CM | POA: Diagnosis not present

## 2017-04-18 DIAGNOSIS — Z794 Long term (current) use of insulin: Secondary | ICD-10-CM | POA: Diagnosis not present

## 2017-04-18 DIAGNOSIS — E785 Hyperlipidemia, unspecified: Secondary | ICD-10-CM | POA: Diagnosis not present

## 2017-04-18 DIAGNOSIS — J101 Influenza due to other identified influenza virus with other respiratory manifestations: Secondary | ICD-10-CM | POA: Diagnosis not present

## 2017-04-18 DIAGNOSIS — Z436 Encounter for attention to other artificial openings of urinary tract: Secondary | ICD-10-CM | POA: Diagnosis not present

## 2017-04-18 NOTE — Telephone Encounter (Signed)
[facility] called verbal order for foley care ,nephrostomy and diabetic care once a week for 6 weeks and advised nurse call urology for catherer and home health aide for 6 weeks as per heather is ok [phone]([id number])

## 2017-04-19 ENCOUNTER — Other Ambulatory Visit: Payer: Self-pay

## 2017-04-19 ENCOUNTER — Telehealth: Payer: Self-pay | Admitting: Urology

## 2017-04-19 DIAGNOSIS — N320 Bladder-neck obstruction: Secondary | ICD-10-CM | POA: Diagnosis not present

## 2017-04-19 DIAGNOSIS — E1151 Type 2 diabetes mellitus with diabetic peripheral angiopathy without gangrene: Secondary | ICD-10-CM | POA: Diagnosis not present

## 2017-04-19 DIAGNOSIS — Z436 Encounter for attention to other artificial openings of urinary tract: Secondary | ICD-10-CM | POA: Diagnosis not present

## 2017-04-19 DIAGNOSIS — C61 Malignant neoplasm of prostate: Secondary | ICD-10-CM | POA: Diagnosis not present

## 2017-04-19 DIAGNOSIS — Z466 Encounter for fitting and adjustment of urinary device: Secondary | ICD-10-CM | POA: Diagnosis not present

## 2017-04-19 DIAGNOSIS — N1330 Unspecified hydronephrosis: Secondary | ICD-10-CM | POA: Diagnosis not present

## 2017-04-19 MED ORDER — GLUCOSE BLOOD VI STRP: ORAL_STRIP | 3 refills | Status: DC

## 2017-04-19 NOTE — Telephone Encounter (Signed)
Pt's daughter wants to know if Dad can come in any sooner than 3/19.

## 2017-04-20 ENCOUNTER — Telehealth: Payer: Self-pay

## 2017-04-20 ENCOUNTER — Other Ambulatory Visit: Payer: Self-pay | Admitting: Nurse Practitioner

## 2017-04-20 ENCOUNTER — Other Ambulatory Visit: Payer: Self-pay

## 2017-04-20 ENCOUNTER — Inpatient Hospital Stay: Payer: Medicare Other

## 2017-04-20 ENCOUNTER — Inpatient Hospital Stay: Payer: Medicare Other | Attending: Internal Medicine | Admitting: Internal Medicine

## 2017-04-20 VITALS — BP 136/81 | HR 102 | Temp 97.8°F | Resp 20 | Ht 66.0 in | Wt 127.6 lb

## 2017-04-20 DIAGNOSIS — R609 Edema, unspecified: Secondary | ICD-10-CM | POA: Insufficient documentation

## 2017-04-20 DIAGNOSIS — J90 Pleural effusion, not elsewhere classified: Secondary | ICD-10-CM | POA: Insufficient documentation

## 2017-04-20 DIAGNOSIS — E1165 Type 2 diabetes mellitus with hyperglycemia: Secondary | ICD-10-CM | POA: Insufficient documentation

## 2017-04-20 DIAGNOSIS — Z436 Encounter for attention to other artificial openings of urinary tract: Secondary | ICD-10-CM | POA: Diagnosis not present

## 2017-04-20 DIAGNOSIS — I700 Atherosclerosis of aorta: Secondary | ICD-10-CM

## 2017-04-20 DIAGNOSIS — E785 Hyperlipidemia, unspecified: Secondary | ICD-10-CM | POA: Diagnosis not present

## 2017-04-20 DIAGNOSIS — I2510 Atherosclerotic heart disease of native coronary artery without angina pectoris: Secondary | ICD-10-CM

## 2017-04-20 DIAGNOSIS — R638 Other symptoms and signs concerning food and fluid intake: Secondary | ICD-10-CM

## 2017-04-20 DIAGNOSIS — R918 Other nonspecific abnormal finding of lung field: Secondary | ICD-10-CM | POA: Diagnosis not present

## 2017-04-20 DIAGNOSIS — C61 Malignant neoplasm of prostate: Secondary | ICD-10-CM | POA: Diagnosis not present

## 2017-04-20 DIAGNOSIS — Z191 Hormone sensitive malignancy status: Secondary | ICD-10-CM | POA: Insufficient documentation

## 2017-04-20 DIAGNOSIS — K219 Gastro-esophageal reflux disease without esophagitis: Secondary | ICD-10-CM | POA: Diagnosis not present

## 2017-04-20 DIAGNOSIS — N320 Bladder-neck obstruction: Secondary | ICD-10-CM | POA: Diagnosis not present

## 2017-04-20 DIAGNOSIS — Z794 Long term (current) use of insulin: Secondary | ICD-10-CM

## 2017-04-20 DIAGNOSIS — E871 Hypo-osmolality and hyponatremia: Secondary | ICD-10-CM | POA: Insufficient documentation

## 2017-04-20 DIAGNOSIS — I6529 Occlusion and stenosis of unspecified carotid artery: Secondary | ICD-10-CM | POA: Diagnosis not present

## 2017-04-20 DIAGNOSIS — E1151 Type 2 diabetes mellitus with diabetic peripheral angiopathy without gangrene: Secondary | ICD-10-CM | POA: Diagnosis not present

## 2017-04-20 DIAGNOSIS — Z7189 Other specified counseling: Secondary | ICD-10-CM

## 2017-04-20 DIAGNOSIS — N1330 Unspecified hydronephrosis: Secondary | ICD-10-CM | POA: Diagnosis not present

## 2017-04-20 DIAGNOSIS — R748 Abnormal levels of other serum enzymes: Secondary | ICD-10-CM | POA: Diagnosis not present

## 2017-04-20 DIAGNOSIS — C7911 Secondary malignant neoplasm of bladder: Secondary | ICD-10-CM | POA: Diagnosis not present

## 2017-04-20 DIAGNOSIS — I110 Hypertensive heart disease with heart failure: Secondary | ICD-10-CM | POA: Diagnosis not present

## 2017-04-20 DIAGNOSIS — Z466 Encounter for fitting and adjustment of urinary device: Secondary | ICD-10-CM | POA: Diagnosis not present

## 2017-04-20 DIAGNOSIS — Z79899 Other long term (current) drug therapy: Secondary | ICD-10-CM | POA: Diagnosis not present

## 2017-04-20 DIAGNOSIS — M899 Disorder of bone, unspecified: Secondary | ICD-10-CM | POA: Insufficient documentation

## 2017-04-20 LAB — COMPREHENSIVE METABOLIC PANEL
ALT: 29 U/L (ref 17–63)
AST: 28 U/L (ref 15–41)
Albumin: 3.1 g/dL — ABNORMAL LOW (ref 3.5–5.0)
Alkaline Phosphatase: 136 U/L — ABNORMAL HIGH (ref 38–126)
Anion gap: 7 (ref 5–15)
BUN: 20 mg/dL (ref 6–20)
CO2: 26 mmol/L (ref 22–32)
Calcium: 8.4 mg/dL — ABNORMAL LOW (ref 8.9–10.3)
Chloride: 100 mmol/L — ABNORMAL LOW (ref 101–111)
Creatinine, Ser: 0.87 mg/dL (ref 0.61–1.24)
GFR calc Af Amer: >60 mL/min (ref >60)
GFR calc non Af Amer: >60 mL/min (ref >60)
Glucose, Bld: 348 mg/dL — ABNORMAL HIGH (ref 65–99)
Potassium: 4.6 mmol/L (ref 3.5–5.1)
Sodium: 133 mmol/L — ABNORMAL LOW (ref 135–145)
Total Bilirubin: 0.3 mg/dL (ref 0.3–1.2)
Total Protein: 8 g/dL (ref 6.5–8.1)

## 2017-04-20 LAB — CBC WITH DIFFERENTIAL/PLATELET
Basophils Absolute: 0 10*3/uL (ref 0–0.1)
Basophils Relative: 1 %
Eosinophils Absolute: 0.1 10*3/uL (ref 0–0.7)
Eosinophils Relative: 2 %
HCT: 31.6 % — ABNORMAL LOW (ref 40.0–52.0)
Hemoglobin: 10.6 g/dL — ABNORMAL LOW (ref 13.0–18.0)
Lymphocytes Relative: 23 %
Lymphs Abs: 1.5 10*3/uL (ref 1.0–3.6)
MCH: 28.3 pg (ref 26.0–34.0)
MCHC: 33.5 g/dL (ref 32.0–36.0)
MCV: 84.2 fL (ref 80.0–100.0)
Monocytes Absolute: 0.6 10*3/uL (ref 0.2–1.0)
Monocytes Relative: 9 %
Neutro Abs: 4.3 10*3/uL (ref 1.4–6.5)
Neutrophils Relative %: 65 %
Platelets: 353 10*3/uL (ref 150–440)
RBC: 3.76 MIL/uL — ABNORMAL LOW (ref 4.40–5.90)
RDW: 15.1 % — ABNORMAL HIGH (ref 11.5–14.5)
WBC: 6.4 10*3/uL (ref 3.8–10.6)

## 2017-04-20 MED ORDER — INSULIN GLARGINE 100 UNIT/ML ~~LOC~~ SOLN: SUBCUTANEOUS | 3 refills | Status: DC

## 2017-04-20 MED ORDER — GLUCERNA SHAKE PO LIQD: 237.0000 mL | Freq: Three times a day (TID) | ORAL | 5 refills | Status: DC

## 2017-04-20 MED ORDER — METFORMIN HCL 850 MG PO TABS
850.0000 mg | ORAL_TABLET | Freq: Two times a day (BID) | ORAL | 3 refills | Status: DC
Start: 2017-04-20 — End: 2017-04-25

## 2017-04-20 NOTE — Progress Notes (Signed)
Sent rx to pharmacy for glucerna shakes up to TID as needed

## 2017-04-20 NOTE — Progress Notes (Signed)
[facility] NOTE  Patient Care Team: [name], MD as PCP - General (Internal Medicine)  CHIEF COMPLAINTS/PURPOSE OF CONSULTATION:  Metastatic prostate cancer  #  Oncology History   # FEB 2019-Metastatic prostate cancer/ castrate sensitive [bulky retroperitoneal adenopathy; invasion into the bladder; rectum] Bladder Bx- prostate.    # Poorly controlled DM-on insulin  # s/p right PCN/ Foley cath [Dr.Brandon]     Prostate cancer ([place])     HISTORY OF PRESENTING ILLNESS:  Michael [name] 84 y.o.  male with a recently diagnosed metastatic prostate cancer is here for follow-up.  Patient was recently admitted to the hospital for difficulty urination abdominal pain/of poor appetite/constipation etc.  On further evaluation noted to have retroperitoneal adenopathy/pelvic mass involving the bladder prostate and the rectum.  Patient had right-sided percutaneous nephrostomy; and also Foley catheter placed.  Patient is currently on Casodex; also started his degarelix approximately 4 days ago.  In the last few days patient is doing better as per family.  He denies any pain.  No nausea no vomiting.  However his blood sugars have been elevated over the last few days.  Most recent blood sugar about 326.  Is currently on Levemir/also on glyburide.   ROS: A complete 10 point review of system is done which is negative except mentioned above in history of present illness  MEDICAL HISTORY:  Past Medical History:  Diagnosis Date  . Benign prostatic hypertrophy   . Bilateral carotid artery disease (HCC)    Mild plaque formation without obstructive disease noted on carotid Doppler.  . Coronary artery disease    Previous cardiac catheterization at [facility] in 2010. The patient was told about 2 blockages which did not require revascularization.  . Diabetes mellitus without complication ([place])   . Essential hypertension   . GERD (gastroesophageal reflux disease)   .  Hyperlipidemia   . Hypertension   . Left carotid artery stenosis     SURGICAL HISTORY: Past Surgical History:  Procedure Laterality Date  . CARDIAC CATHETERIZATION  2010  . CARDIAC CATHETERIZATION N/A 02/03/2015   Procedure: Left Heart Cath and Coronary Angiography;  Surgeon: Wellington [name], MD;  Location: [facility];  Service: Cardiovascular;  Laterality: N/A;  . CATARACT EXTRACTION    . CYSTOSCOPY W/ RETROGRADES Bilateral 04/11/2017   Procedure: CYSTOSCOPY WITH RETROGRADE PYELOGRAM;  Surgeon: [name], MD;  Location: [facility];  Service: Urology;  Laterality: Bilateral;  . CYSTOSCOPY WITH STENT PLACEMENT Left 04/11/2017   Procedure: CYSTOSCOPY WITH STENT PLACEMENT and fulgeration;  Surgeon: [name], MD;  Location: [facility];  Service: Urology;  Laterality: Left;  . CYSTOSCOPY WITH URETHRAL DILATATION Bilateral 04/11/2017   Procedure: CYSTOSCOPY WITH URETHRAL DILATATION;  Surgeon: [name], MD;  Location: [facility];  Service: Urology;  Laterality: Bilateral;  . IR NEPHROSTOMY PLACEMENT RIGHT  04/13/2017  . PROSTATECTOMY      SOCIAL HISTORY: Social History   Socioeconomic History  . Marital status: Widowed    Spouse name: Not on file  . Number of children: Not on file  . Years of education: Not on file  . Highest education level: Not on file  Social Needs  . Financial resource strain: Not on file  . Food insecurity - worry: Not on file  . Food insecurity - inability: Not on file  . Transportation needs - medical: Not on file  . Transportation needs - non-medical: Not on file  Occupational History  . Not on file  Tobacco  Use  . Smoking status: Never Smoker  . Smokeless tobacco: Never Used  Substance and Sexual Activity  . Alcohol use: No  . Drug use: No  . Sexual activity: Not on file  Other Topics Concern  . Not on file  Social History Narrative  . Not on file    FAMILY HISTORY: Family History  Problem Relation Age of Onset  .  Hypertension Father     ALLERGIES:  is allergic to zosyn [piperacillin sod-tazobactam so].  MEDICATIONS:  Current Outpatient Medications  Medication Sig Dispense Refill  . albuterol (PROVENTIL HFA;VENTOLIN HFA) 108 (90 Base) MCG/ACT inhaler Inhale 2 puffs into the lungs every 6 (six) hours as needed for wheezing or shortness of breath. 1 Inhaler 2  . aspirin (GOODSENSE ASPIRIN) 325 MG tablet Take 325 mg by mouth daily.     [name] atorvastatin (LIPITOR) 10 MG tablet Take 10 mg by mouth daily.    . bicalutamide (CASODEX) 50 MG tablet Take 1 tablet (50 mg total) by mouth daily. 30 tablet 0  . Calcium-Vitamin D 600-200 MG-UNIT tablet Take by mouth.    . finasteride (PROSCAR) 5 MG tablet TAKE 1 TABLET (5 MG TOTAL) BY MOUTH DAILY.  3  . glimepiride (AMARYL) 4 MG tablet Take 1 tablet (4 mg total) by mouth 2 (two) times daily. 60 tablet 3  . glucose blood (ACCU-CHEK GUIDE) test strip Use asdirected three times a day diag E11.65 100 each 3  . Insulin Syringe-Needle U-100 (B-D INS SYR ULTRAFINE 1CC/31G) 31G X 5/16" 1 ML MISC Use as directed 100 each 0  . lisinopril (PRINIVIL,ZESTRIL) 2.5 MG tablet Take 2.5 mg by mouth daily.  4  . metoprolol succinate (TOPROL-XL) 25 MG 24 hr tablet Take 25 mg by mouth daily.   6  . omeprazole (PRILOSEC) 20 MG capsule Take 20 mg by mouth daily.  1  . Artificial Saliva (SALIVASURE) LOZG Use as directed 1 lozenge in the mouth or throat 3 (three) times daily. (Patient not taking: Reported on 04/20/2017) 30 lozenge 2  . cetirizine (ZYRTEC) 10 MG tablet Take 1 tablet (10 mg total) by mouth daily. (Patient not taking: Reported on 04/20/2017) 30 tablet 6  . feeding supplement, GLUCERNA SHAKE, (GLUCERNA SHAKE) LIQD Take 237 mLs by mouth 3 (three) times daily between meals. 90 Can 5  . guaiFENesin (MUCINEX) 600 MG 12 hr tablet Take 1 tablet (600 mg total) by mouth 2 (two) times daily for 7 days. (Patient not taking: Reported on 04/20/2017) 14 tablet 0  . insulin glargine (LANTUS) 100  UNIT/ML injection 10 units sq twice a day with meals. 1 vial 3  . metFORMIN (GLUCOPHAGE) 850 MG tablet Take 1 tablet (850 mg total) by mouth 2 (two) times daily with a meal. 60 tablet 3  . Multiple Vitamins-Minerals (MULTIVITAMIN WITH MINERALS) tablet Take 1 tablet by mouth daily.      No current facility-administered medications for this visit.       [name]  PHYSICAL EXAMINATION: ECOG PERFORMANCE STATUS: 2 - Symptomatic, <50% confined to bed  Vitals:   04/20/17 0850  BP: 136/81  Pulse: (!) 102  Resp: 20  Temp: 97.8 F (36.6 C)   Filed Weights   04/20/17 0850  Weight: 127 lb 9.6 oz (57.9 kg)    GENERAL: Well-nourished well-developed; Alert, no distress and comfortable.   Accompanied by daughter/family. EYES: no pallor or icterus OROPHARYNX: no thrush or ulceration; good dentition  NECK: supple, no masses felt LYMPH:  no palpable lymphadenopathy in the  cervical, axillary or inguinal regions LUNGS: clear to auscultation and  No wheeze or crackles HEART/CVS: regular rate & rhythm and no murmurs; No lower extremity edema ABDOMEN: abdomen soft, non-tender and normal bowel sounds; Foley catheter in place; percutaneous nephrostomy tube in place Musculoskeletal:no cyanosis of digits and no clubbing  PSYCH: alert & oriented x 3 with fluent speech NEURO: no focal motor/sensory deficits SKIN:  no rashes or significant lesions  LABORATORY DATA:  I have reviewed the data as listed Lab Results  Component Value Date   WBC 6.4 04/20/2017   HGB 10.6 (L) 04/20/2017   HCT 31.6 (L) 04/20/2017   MCV 84.2 04/20/2017   PLT 353 04/20/2017   Recent Labs    04/08/17 1026 04/11/17 1210 04/12/17 0419 04/13/17 0403 04/20/17 0814  NA 130* 132* 135 135 133*  K 3.6 3.9 4.1 3.5 4.6  CL 100* 98* 101 104 100*  CO2 20* 22 24 22 26   GLUCOSE 239* 289* 193* 139* 348*  BUN 21* 32* 24* 18 20  CREATININE 1.23 1.42* 1.17 1.00 0.87  CALCIUM 8.6* 8.4* 8.0* 7.9* 8.4*  GFRNONAA 53* 45* 57* >60 >60   GFRAA >60 52* >60 >60 >60  PROT 7.7 7.7  --   --  8.0  ALBUMIN 3.1* 3.1*  --   --  3.1*  AST 44* 37  --   --  28  ALT 17 19  --   --  29  ALKPHOS 111 107  --   --  136*  BILITOT 0.5 0.5  --   --  0.3    RADIOGRAPHIC STUDIES: I have personally reviewed the radiological images as listed and agreed with the findings in the report. Dg Chest 2 View  Result Date: 04/08/2017 CLINICAL DATA:  Left flank pain radiates to bladder. EXAM: CHEST  2 VIEW COMPARISON:  12/14/2016 FINDINGS: The lungs are clear without focal pneumonia, edema, pneumothorax or pleural effusion. The cardiopericardial silhouette is within normal limits for size. The visualized bony structures of the thorax are intact. Telemetry leads overlie the chest. IMPRESSION: No active cardiopulmonary disease. Electronically Signed   By: Misty Stanley M.D.   On: 04/08/2017 10:57   [place] Intraoperative  Result Date: 04/13/2017 CLINICAL DATA:  Ultrasound was provided for use by the ordering physician, and a technical charge was applied by the performing facility.  No radiologist interpretation/professional services rendered.   Ct Abdomen Pelvis W Contrast  Result Date: 04/11/2017 CLINICAL DATA:  Abdominal pain with radiation to his back. Symptoms for 6 weeks. Dysuria. Chronic diarrhea. Recently diagnosed with flu. Anemic. Prior prostatectomy per report. EXAM: CT ABDOMEN AND PELVIS WITH CONTRAST TECHNIQUE: Multidetector CT imaging of the abdomen and pelvis was performed using the standard protocol following bolus administration of intravenous contrast. CONTRAST:  24mL ISOVUE-300 IOPAMIDOL (ISOVUE-300) INJECTION 61% COMPARISON:  Chest CT 03/01/2015. No prior dedicated abdominopelvic imaging. FINDINGS: Lower chest: Right middle lobe pulmonary nodule of 6 mm on image 3/4. Suspect centrilobular emphysema. Normal heart size with tiny left pleural effusion. Right coronary artery atherosclerosis. Retrocrural adenopathy at 10 mm on image 15/2. Hepatobiliary:  Subtle irregular hepatic capsule may be artifactual and related to CT reconstruction technique. No focal liver lesion. Normal gallbladder, without biliary ductal dilatation. Pancreas: Normal, without mass or ductal dilatation. Spleen: Normal in size, without focal abnormality. Adrenals/Urinary Tract: Mild right adrenal thickening. Normal left adrenal gland. Too small to characterize interpolar right renal lesion is most likely a cyst. Moderate bilateral hydroureteronephrosis. This is slightly worse on the  left, with marked left perinephric interstitial thickening, possibly related to forniceal rupture. Hydroureter is followed only to the upper abdomen, likely obstructed by retroperitoneal adenopathy. Bladder tumor is eccentric right, including on image 62/2. There is more inferior masslike right bladder placed tumor including at 4.4 x 3.5 cm on image 67/2. Stomach/Bowel: Proximal gastric underdistention. Rectal wall thickening is contiguous with bladder and prostatic soft tissue fullness, including on image 71/series 2. There is a large amount of colonic stool within the more proximal colon, suggesting a component of low-grade obstruction. Normal terminal ileum. Normal small bowel. Vascular/Lymphatic: Advanced aortic and branch vessel atherosclerosis. Bulky retroperitoneal adenopathy. Left periaortic nodal tissue surrounds the aorta and measures 5.0 x 3.6 cm on image 39/2. Marked pelvic sidewall adenopathy, including a right external iliac nodal mass of 3.9 x 5.4 cm on image 57/2. Reproductive: Despite the clinical history, the prostate appears present including on image 72/series 2. Soft tissue is positioned between the posterior right bladder wall and rectum, contiguous with the superior aspect of the prostate and right seminal vesicle. Example image 66/2. Other: Perirectal edema. No abdominopelvic ascites. Tiny fat containing left inguinal hernia. Musculoskeletal: Bilateral L5 pars defects with grade 2 L5-S1  anterolisthesis. Heterogeneous marrow density, including in the right-side of the L1 vertebral body on image 47/6. IMPRESSION: 1. 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 Infiltrative soft tissue involves the bladder, rectum, and superior aspect of the prostate. Bladder primary is slightly favored. Prostatic or rectal primary is felt less likely. Given the clinical history and appearance of the rectum, fistulous communication between bladder and rectum cannot be excluded. No gas within the bladder to confirm fistula. 2. Suspect a component of bowel obstruction at the level of rectal involvement. 3. Extensive nodal metastasis within the abdomen and retrocrural space of the lower chest. 4. Bilateral hydronephrosis, likely related to retroperitoneal adenopathy. Extensive left perirenal edema, suggesting forniceal rupture. 5. Tiny left pleural effusion. 6. Coronary artery atherosclerosis. Aortic Atherosclerosis (ICD10-I70.0). 7. Suspect osseous metastasis, most apparent at L1. 8. Nonspecific right middle lobe pulmonary nodule. These results will be called to the ordering clinician or representative by the Radiologist Assistant, and communication documented in the PACS or zVision Dashboard. Electronically Signed   By: Abigail Miyamoto M.D.   On: 04/11/2017 16:00   US Renal  Result Date: 04/12/2017 CLINICAL DATA:  History of metastatic disease from primary pelvic lesion, hydronephrosis EXAM: RENAL / URINARY TRACT ULTRASOUND COMPLETE COMPARISON:  CT abdomen pelvis of 04/19/2017 FINDINGS: Right Kidney: Length: 10.0 cm. Moderate hydronephrosis is present. The renal parenchyma echogenicity is unremarkable. Left Kidney: Length: 10.1 cm. No hydronephrosis is currently seen. The echogenicity of the renal parenchyma is within normal limits. Bladder: The urinary bladder is decompressed with Foley catheter present. Urinary bladder wall does appear to be diffusely thickened. IMPRESSION: 1. Right-sided moderate  hydronephrosis.  No left hydronephrosis. 2. The urinary bladder is decompressed but does appear thick walled. Electronically Signed   By: Ivar Drape M.D.   On: 04/12/2017 17:13   Dg Chest Port 1 View  Result Date: 04/14/2017 CLINICAL DATA:  Non smoker, neph tube(s) placed yesterday, now with SOBPer chart: a known history of CAD, peripheral vascular disease, diabetes, hypertension, influenza a and hyperlipidemia admitted for abdominal pain and found to have incidental advanced metastatic disease EXAM: PORTABLE CHEST 1 VIEW COMPARISON:  04/08/2017 FINDINGS: Cardiac silhouette is normal in size. No mediastinal or hilar masses. No evidence of adenopathy. Clear lungs.  No pleural effusion or pneumothorax. Skeletal structures are grossly  intact. IMPRESSION: No active disease. Electronically Signed   By: Lajean Manes M.D.   On: 04/14/2017 15:30   Ir Nephrostomy Placement Right  Result Date: 04/13/2017 INDICATION: History of metastatic prostate cancer, now with bilateral obstructive uropathy. Patient was able to undergo successful left-sided retrograde ureteral stent placement however failed attempted right-sided retrograde stent placement secondary to nonvisualization of the right urinary bladder trigone. As such, request made for image guided placement of a right-sided percutaneous nephrostomy catheter. EXAM: 1. ULTRASOUND GUIDANCE FOR PUNCTURE OF THE RIGHT RENAL COLLECTING SYSTEM 2. RIGHT PERCUTANEOUS NEPHROSTOMY TUBE PLACEMENT. COMPARISON:  CT abdomen pelvis - 04/11/2017; renal ultrasound - 04/12/2017 MEDICATIONS: Ciprofloxacin 400 mg IV; the antibiotic was administered in an appropriate time frame prior to skin puncture. ANESTHESIA/SEDATION: Moderate (conscious) sedation was employed during this procedure. A total of Versed 2 mg and Fentanyl 75 mcg was administered intravenously. Moderate Sedation Time: 16 minutes. The patient's level of consciousness and vital signs were monitored continuously by radiology  nursing throughout the procedure under my direct supervision. CONTRAST:  20 mL Isovue 300 administered into the collecting system FLUOROSCOPY TIME:  1 minute 36 seconds (413 mGy) COMPLICATIONS: None immediate. PROCEDURE: The procedure, risks, benefits, and alternatives were explained to the patient. Questions regarding the procedure were encouraged and answered. The patient understands and consents to the procedure. A timeout was performed prior to the initiation of the procedure. The right flank region was prepped with Betadine in a sterile fashion, and a sterile drape was applied covering the operative field. A sterile gown and sterile gloves were used for the procedure. Local anesthesia was provided with 1% Lidocaine with epinephrine. Ultrasound was used to localize the right kidney. Under direct ultrasound guidance, a 21 gauge needle was advanced into the renal collecting system. An ultrasound image documentation was performed. Access within the collecting system was confirmed with the efflux of urine followed by contrast injection. Over a Nitrex wire, the inner three [place] catheter of an Accustick set was advanced into the renal collecting system. Contrast injection was injected into the collecting system as several spot radiographs were obtained in various obliquities confirming puncture within a posterior inferior calix. As such, the tract was dilated with an Accustick stent. Over a guide wire, a 10-French percutaneous nephrostomy catheter was advanced into the collecting system where the coil was formed and locked. Contrast was injected and several sport radiographs were obtained in various obliquities confirming access. The catheter was secured at the skin with a Prolene retention suture and a gravity bag was placed. A dressing was placed. The patient tolerated procedure well without immediate postprocedural complication. FINDINGS: Ultrasound scanning demonstrates a moderate dilated right collecting  system. Under direct ultrasound guidance, a posterior inferior calix was targeted allowing advancement of an 10-French percutaneous nephrostomy catheter under intermittent fluoroscopic guidance. Contrast injection confirmed appropriate positioning. IMPRESSION: Successful ultrasound and fluoroscopic guided placement of a right sided 10 French PCN. Electronically Signed   By: Sandi Mariscal M.D.   On: 04/13/2017 13:33    ASSESSMENT & PLAN:   Prostate cancer [facility]) # Metastatic prostate cancer/ castrate sensitive [bulky retroperitoneal adenopathy; invasion into the bladder; rectum].  I would recommend a PET scan for further evaluation especially for the bony metastases-especially given low PSA which seems to be discordant given the burden of his disease.  #Lengthy discussion with the patient and family regarding the incurable nature of the disease.  Recommend continued ADT with degarelix; will transition to Lupron after the next shot.  #Discussed the role  of chemotherapy with docetaxel every 3 weeks given the bulky visceral disease.  Discussed option of using a bilateral plus prednisone given the recent data based on STAMPEDE/LATITUDE trials discussed that  I would typically reserve this treatment option for low volume disease.  However this would be an option of the patient declines chemotherapy.   Discussed the potential side effects including but not limited to-increasing fatigue, nausea vomiting, diarrhea, hair loss, sores in the mouth, increase risk of infection and also neuropathy.   #Also discussed the potential side effects of Zytiga and prednisone including elevated blood sugars volume overload hypokalemia etc.  #CT scan -solitary sacral metastases the bone.  Await the PET scan.   # poorly controlled Blood glucose-patient is on Levemir; and glimepiride.  Since renal function is improved recommend adding metformin 850 twice daily discussed the potential side effects.  Make a referral to  endocrinology.  Discussed that patient will likely have increased blood sugars/while on chemotherapy because of steroids etc.  # in 2 weeks/PET scan; labs.   # 40 minutes face-to-face with the patient discussing the above plan of care; more than 50% of time spent on prognosis/ natural history; counseling and coordination.   All questions were answered. The patient knows to call the clinic with any problems, questions or concerns.   [name], MD 04/21/2017 11:28 PM

## 2017-04-20 NOTE — Telephone Encounter (Signed)
Pt daughter called about high sugar levels.  Called her back to ask about all of the medications he is taking for his sugar, which include: 4mg  glimperide once in the am (different Dr. Cristi Loron one in pm) and take .7 levemir at bedtime.  Spoke to Dr. Clayborn Bigness and as per Dr. Humphrey Rolls I called pt daughter back and let her know to split the 4 mg glimperide into 2 mg dosage and have him take 2mg  BID with meals. Also let her know we have added lantus 10 units BID with meals and that has been sent to the pharmacy. Pt daughter was also advised to wait until 5/6 pm tonight to give pt lantus.

## 2017-04-20 NOTE — Assessment & Plan Note (Addendum)
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  I would recommend a PET scan for further evaluation especially for the bony metastases-especially given low PSA which seems to be discordant given the burden of his disease.  #Lengthy discussion with the patient and family regarding the incurable nature of the disease.  Recommend continued ADT with degarelix; will transition to Lupron after the next shot.  #Discussed the role of chemotherapy with docetaxel every 3 weeks given the bulky visceral disease.  Discussed option of using a bilateral plus prednisone given the recent data based on STAMPEDE/LATITUDE trials discussed that  I would typically reserve this treatment option for low volume disease.  However this would be an option of the patient declines chemotherapy.   Discussed the potential side effects including but not limited to-increasing fatigue, nausea vomiting, diarrhea, hair loss, sores in the mouth, increase risk of infection and also neuropathy.   #Also discussed the potential side effects of Zytiga and prednisone including elevated blood sugars volume overload hypokalemia etc.  #CT scan -solitary sacral metastases the bone.  Await the PET scan.   # poorly controlled Blood glucose-patient is on Levemir; and glimepiride.  Since renal function is improved recommend adding metformin 850 twice daily discussed the potential side effects.  Make a referral to endocrinology.  Discussed that patient will likely have increased blood sugars/while on chemotherapy because of steroids etc.  # in 2 weeks/PET scan; labs.   # 40 minutes face-to-face with the patient discussing the above plan of care; more than 50% of time spent on prognosis/ natural history; counseling and coordination.

## 2017-04-21 DIAGNOSIS — Z7189 Other specified counseling: Secondary | ICD-10-CM | POA: Insufficient documentation

## 2017-04-23 ENCOUNTER — Telehealth: Payer: Self-pay | Admitting: *Deleted

## 2017-04-23 DIAGNOSIS — Z436 Encounter for attention to other artificial openings of urinary tract: Secondary | ICD-10-CM | POA: Diagnosis not present

## 2017-04-23 DIAGNOSIS — E1151 Type 2 diabetes mellitus with diabetic peripheral angiopathy without gangrene: Secondary | ICD-10-CM | POA: Diagnosis not present

## 2017-04-23 DIAGNOSIS — Z466 Encounter for fitting and adjustment of urinary device: Secondary | ICD-10-CM | POA: Diagnosis not present

## 2017-04-23 DIAGNOSIS — N1330 Unspecified hydronephrosis: Secondary | ICD-10-CM | POA: Diagnosis not present

## 2017-04-23 DIAGNOSIS — N320 Bladder-neck obstruction: Secondary | ICD-10-CM | POA: Diagnosis not present

## 2017-04-23 DIAGNOSIS — C61 Malignant neoplasm of prostate: Secondary | ICD-10-CM | POA: Diagnosis not present

## 2017-04-23 NOTE — Telephone Encounter (Signed)
Colette, could you f/u on the endocrinology apt to Dr. Gabriel Carina. Thanks.

## 2017-04-23 NOTE — Telephone Encounter (Signed)
Received a call regarding referral to Endocrinology. They have not heard from anyone. Please return call

## 2017-04-24 ENCOUNTER — Telehealth: Payer: Self-pay

## 2017-04-24 NOTE — Telephone Encounter (Signed)
Poke to [employer] she requested PT for 2 x wk for 4 wks.  I gave the verbal order to go ahead with PT.  dbs

## 2017-04-25 ENCOUNTER — Encounter: Payer: Self-pay | Admitting: Internal Medicine

## 2017-04-25 ENCOUNTER — Ambulatory Visit (INDEPENDENT_AMBULATORY_CARE_PROVIDER_SITE_OTHER): Payer: Medicare Other | Admitting: Internal Medicine

## 2017-04-25 VITALS — BP 140/80 | HR 95 | Resp 16 | Ht 66.0 in | Wt 125.0 lb

## 2017-04-25 DIAGNOSIS — R634 Abnormal weight loss: Secondary | ICD-10-CM

## 2017-04-25 DIAGNOSIS — E1165 Type 2 diabetes mellitus with hyperglycemia: Secondary | ICD-10-CM

## 2017-04-25 DIAGNOSIS — C7982 Secondary malignant neoplasm of genital organs: Secondary | ICD-10-CM

## 2017-04-25 LAB — POCT CBG (FASTING - GLUCOSE)-MANUAL ENTRY: Glucose Fasting, POC: 318 mg/dL — AB (ref 70–99)

## 2017-04-25 NOTE — Progress Notes (Signed)
[facility] [place], [place] [address]  Internal MEDICINE  Office Visit Note  Patient Name: Michael [name]  [id number]  [id number]  Date of Service: 04/25/2017  Chief Complaint  Patient presents with  . Hyperglycemia    last night glucose 366 and this morning 166    HPI Pt is here for routine follow up.pt has been in the hospital for possible metastatic prostate cancer. Since discharge his blood sugars have been elevated  Current Medication: Outpatient Encounter Medications as of 04/25/2017  Medication Sig  . albuterol (PROVENTIL HFA;VENTOLIN HFA) 108 (90 Base) MCG/ACT inhaler Inhale 2 puffs into the lungs every 6 (six) hours as needed for wheezing or shortness of breath.  . Artificial Saliva (SALIVASURE) LOZG Use as directed 1 lozenge in the mouth or throat 3 (three) times daily. (Patient not taking: Reported on 04/20/2017)  . aspirin (GOODSENSE ASPIRIN) 325 MG tablet Take 325 mg by mouth daily.   [name] atorvastatin (LIPITOR) 10 MG tablet Take 10 mg by mouth daily.  . bicalutamide (CASODEX) 50 MG tablet Take 1 tablet (50 mg total) by mouth daily.  . Calcium-Vitamin D 600-200 MG-UNIT tablet Take by mouth.  . cetirizine (ZYRTEC) 10 MG tablet Take 1 tablet (10 mg total) by mouth daily. (Patient not taking: Reported on 04/20/2017)  . feeding supplement, GLUCERNA SHAKE, (GLUCERNA SHAKE) LIQD Take 237 mLs by mouth 3 (three) times daily between meals.  . finasteride (PROSCAR) 5 MG tablet TAKE 1 TABLET (5 MG TOTAL) BY MOUTH DAILY.  [name] glimepiride (AMARYL) 4 MG tablet Take 1 tablet (4 mg total) by mouth 2 (two) times daily.  [name] glucose blood (ACCU-CHEK GUIDE) test strip Use asdirected three times a day diag E11.65  . insulin glargine (LANTUS) 100 UNIT/ML injection 10 units sq twice a day with meals. (Patient not taking: Reported on 04/25/2017)  . Insulin Syringe-Needle U-100 (B-D INS SYR ULTRAFINE 1CC/31G) 31G X 5/16" 1 ML MISC Use as directed  . LEVEMIR 100 UNIT/ML injection  INJECT 7 UNITS SUB-Q AT BEDTIME  . lisinopril (PRINIVIL,ZESTRIL) 2.5 MG tablet Take 2.5 mg by mouth daily.  . metFORMIN (GLUCOPHAGE) 850 MG tablet Take 1 tablet (850 mg total) by mouth 2 (two) times daily with a meal.  . metoprolol succinate (TOPROL-XL) 25 MG 24 hr tablet Take 25 mg by mouth daily.   . Multiple Vitamins-Minerals (MULTIVITAMIN WITH MINERALS) tablet Take 1 tablet by mouth daily.   [name] omeprazole (PRILOSEC) 20 MG capsule Take 20 mg by mouth daily.   No facility-administered encounter medications on file as of 04/25/2017.     Surgical History: Past Surgical History:  Procedure Laterality Date  . CARDIAC CATHETERIZATION  2010  . CARDIAC CATHETERIZATION N/A 02/03/2015   Procedure: Left Heart Cath and Coronary Angiography;  Surgeon: Wellington [name], MD;  Location: [facility];  Service: Cardiovascular;  Laterality: N/A;  . CATARACT EXTRACTION    . CYSTOSCOPY W/ RETROGRADES Bilateral 04/11/2017   Procedure: CYSTOSCOPY WITH RETROGRADE PYELOGRAM;  Surgeon: [name], MD;  Location: [facility];  Service: Urology;  Laterality: Bilateral;  . CYSTOSCOPY WITH STENT PLACEMENT Left 04/11/2017   Procedure: CYSTOSCOPY WITH STENT PLACEMENT and fulgeration;  Surgeon: [name], MD;  Location: [facility];  Service: Urology;  Laterality: Left;  . CYSTOSCOPY WITH URETHRAL DILATATION Bilateral 04/11/2017   Procedure: CYSTOSCOPY WITH URETHRAL DILATATION;  Surgeon: [name], MD;  Location: [facility];  Service: Urology;  Laterality: Bilateral;  . IR NEPHROSTOMY PLACEMENT RIGHT  04/13/2017  .  PROSTATECTOMY      Medical History: Past Medical History:  Diagnosis Date  . Benign prostatic hypertrophy   . Bilateral carotid artery disease (HCC)    Mild plaque formation without obstructive disease noted on carotid Doppler.  . Coronary artery disease    Previous cardiac catheterization at [facility] in 2010. The patient was told about 2 blockages which did not require revascularization.  .  Diabetes mellitus without complication ([place])   . Essential hypertension   . GERD (gastroesophageal reflux disease)   . Hyperlipidemia   . Hypertension   . Left carotid artery stenosis     Family History: Family History  Problem Relation Age of Onset  . Hypertension Father     Social History   Socioeconomic History  . Marital status: Widowed    Spouse name: Not on file  . Number of children: Not on file  . Years of education: Not on file  . Highest education level: Not on file  Social Needs  . Financial resource strain: Not on file  . Food insecurity - worry: Not on file  . Food insecurity - inability: Not on file  . Transportation needs - medical: Not on file  . Transportation needs - non-medical: Not on file  Occupational History  . Not on file  Tobacco Use  . Smoking status: Never Smoker  . Smokeless tobacco: Never Used  Substance and Sexual Activity  . Alcohol use: No  . Drug use: No  . Sexual activity: Not on file  Other Topics Concern  . Not on file  Social History Narrative  . Not on file      Review of Systems  Constitutional: Positive for appetite change and fatigue. Negative for chills and unexpected weight change.  HENT: Negative for congestion, postnasal drip, rhinorrhea, sneezing and sore throat.   Eyes: Negative for redness.  Respiratory: Negative for cough, chest tightness and shortness of breath.   Cardiovascular: Negative for chest pain and palpitations.  Gastrointestinal: Negative for abdominal pain, constipation, diarrhea, nausea and vomiting.  Endocrine:       Elevated blood sugars   Genitourinary: Negative for dysuria and frequency.  Musculoskeletal: Negative for arthralgias, back pain, joint swelling and neck pain.  Skin: Negative for rash.  Neurological: Negative.  Negative for tremors and numbness.  Hematological: Negative for adenopathy. Does not bruise/bleed easily.  Psychiatric/Behavioral: Negative for behavioral problems  (Depression), sleep disturbance and suicidal ideas. The patient is not nervous/anxious.     Vital Signs: BP 140/80   Pulse 95   Resp 16   Ht 5\' 6"  (1.676 m)   Wt 125 lb (56.7 kg)   SpO2 99%   BMI 20.18 kg/m    Physical Exam  Constitutional: He is oriented to person, place, and time. He appears well-developed and well-nourished. No distress.  HENT:  Head: Normocephalic and atraumatic.  Mouth/Throat: Oropharynx is clear and moist. No oropharyngeal exudate.  Eyes: EOM are normal. Pupils are equal, round, and reactive to light.  Neck: Normal range of motion. Neck supple. No JVD present. No tracheal deviation present. No thyromegaly present.  Cardiovascular: Normal rate, regular rhythm and normal heart sounds. Exam reveals no gallop and no friction rub.  No murmur heard. Pulmonary/Chest: Effort normal. No respiratory distress. He has no wheezes. He has no rales. He exhibits no tenderness.  Abdominal: Soft. Bowel sounds are normal.  Genitourinary:  Genitourinary Comments: Foley bag present   Musculoskeletal: Normal range of motion.  Lymphadenopathy:    He has no  cervical adenopathy.  Neurological: He is alert and oriented to person, place, and time. No cranial nerve deficit.  Skin: Skin is warm and dry. He is not diaphoretic.  Psychiatric: He has a normal mood and affect. His behavior is normal. Judgment and thought content normal.    Assessment/Plan: 1. Uncontrolled type 2 diabetes mellitus with hyperglycemia (HCC) - POCT CBG (Fasting - Glucose) - Samples of Basalgar 14-16units is given, Humalog SS 4-8 -12 units before each meal is given, hold Levemir and Amaryl at home for now   2. Metastatic malignant neoplasm to prostate [facility]) - Pt is seen by Oncology and urology, has poor prognosis  3. Weight loss - Improved from previous visit  General Counseling: Gordhanbha verbalizes understanding of the findings of todays visit and agrees with plan of treatment. I have discussed any  further diagnostic evaluation that may be needed or ordered today. We also reviewed his medications today. he has been encouraged to call the office with any questions or concerns that should arise related to todays visit.    Orders Placed This Encounter  Procedures  . POCT CBG (Fasting - Glucose)    Time spent:30Minutes  Dr [name] Internal medicine

## 2017-04-26 ENCOUNTER — Ambulatory Visit
Admission: RE | Admit: 2017-04-26 | Discharge: 2017-04-26 | Disposition: A | Discharge status: Home / Self Care | Payer: Medicare Other | Source: Ambulatory Visit | Attending: Internal Medicine | Admitting: Internal Medicine

## 2017-04-26 DIAGNOSIS — C61 Malignant neoplasm of prostate: Secondary | ICD-10-CM | POA: Diagnosis not present

## 2017-04-26 DIAGNOSIS — R590 Localized enlarged lymph nodes: Secondary | ICD-10-CM | POA: Insufficient documentation

## 2017-04-26 DIAGNOSIS — Z4682 Encounter for fitting and adjustment of non-vascular catheter: Secondary | ICD-10-CM | POA: Diagnosis not present

## 2017-04-26 MED ORDER — AXUMIN (FLUCICLOVINE F 18) INJECTION
10.0000 | Freq: Once | INTRAVENOUS | Status: AC
  Administered 2017-04-26: 9.15 via INTRAVENOUS

## 2017-04-27 DIAGNOSIS — C61 Malignant neoplasm of prostate: Secondary | ICD-10-CM | POA: Diagnosis not present

## 2017-04-27 DIAGNOSIS — N320 Bladder-neck obstruction: Secondary | ICD-10-CM | POA: Diagnosis not present

## 2017-04-27 DIAGNOSIS — E1151 Type 2 diabetes mellitus with diabetic peripheral angiopathy without gangrene: Secondary | ICD-10-CM | POA: Diagnosis not present

## 2017-04-27 DIAGNOSIS — Z436 Encounter for attention to other artificial openings of urinary tract: Secondary | ICD-10-CM | POA: Diagnosis not present

## 2017-04-27 DIAGNOSIS — N1330 Unspecified hydronephrosis: Secondary | ICD-10-CM | POA: Diagnosis not present

## 2017-04-27 DIAGNOSIS — Z466 Encounter for fitting and adjustment of urinary device: Secondary | ICD-10-CM | POA: Diagnosis not present

## 2017-04-30 DIAGNOSIS — N320 Bladder-neck obstruction: Secondary | ICD-10-CM | POA: Diagnosis not present

## 2017-04-30 DIAGNOSIS — Z436 Encounter for attention to other artificial openings of urinary tract: Secondary | ICD-10-CM | POA: Diagnosis not present

## 2017-04-30 DIAGNOSIS — N1330 Unspecified hydronephrosis: Secondary | ICD-10-CM | POA: Diagnosis not present

## 2017-04-30 DIAGNOSIS — C61 Malignant neoplasm of prostate: Secondary | ICD-10-CM | POA: Diagnosis not present

## 2017-04-30 DIAGNOSIS — Z466 Encounter for fitting and adjustment of urinary device: Secondary | ICD-10-CM | POA: Diagnosis not present

## 2017-04-30 DIAGNOSIS — E1151 Type 2 diabetes mellitus with diabetic peripheral angiopathy without gangrene: Secondary | ICD-10-CM | POA: Diagnosis not present

## 2017-05-04 ENCOUNTER — Inpatient Hospital Stay: Payer: Medicare Other

## 2017-05-04 ENCOUNTER — Inpatient Hospital Stay (HOSPITAL_BASED_OUTPATIENT_CLINIC_OR_DEPARTMENT_OTHER): Payer: Medicare Other | Admitting: Internal Medicine

## 2017-05-04 ENCOUNTER — Encounter: Payer: Self-pay | Admitting: Internal Medicine

## 2017-05-04 VITALS — BP 116/69 | HR 89 | Temp 97.6°F | Resp 16 | Wt 127.7 lb

## 2017-05-04 DIAGNOSIS — K219 Gastro-esophageal reflux disease without esophagitis: Secondary | ICD-10-CM

## 2017-05-04 DIAGNOSIS — N1330 Unspecified hydronephrosis: Secondary | ICD-10-CM | POA: Diagnosis not present

## 2017-05-04 DIAGNOSIS — T83511A Infection and inflammatory reaction due to indwelling urethral catheter, initial encounter: Secondary | ICD-10-CM | POA: Diagnosis not present

## 2017-05-04 DIAGNOSIS — C61 Malignant neoplasm of prostate: Secondary | ICD-10-CM

## 2017-05-04 DIAGNOSIS — E785 Hyperlipidemia, unspecified: Secondary | ICD-10-CM | POA: Diagnosis not present

## 2017-05-04 DIAGNOSIS — I700 Atherosclerosis of aorta: Secondary | ICD-10-CM

## 2017-05-04 DIAGNOSIS — R609 Edema, unspecified: Secondary | ICD-10-CM

## 2017-05-04 DIAGNOSIS — E871 Hypo-osmolality and hyponatremia: Secondary | ICD-10-CM | POA: Diagnosis not present

## 2017-05-04 DIAGNOSIS — Z191 Hormone sensitive malignancy status: Secondary | ICD-10-CM | POA: Diagnosis not present

## 2017-05-04 DIAGNOSIS — I2510 Atherosclerotic heart disease of native coronary artery without angina pectoris: Secondary | ICD-10-CM

## 2017-05-04 DIAGNOSIS — R509 Fever, unspecified: Secondary | ICD-10-CM | POA: Diagnosis not present

## 2017-05-04 DIAGNOSIS — I110 Hypertensive heart disease with heart failure: Secondary | ICD-10-CM

## 2017-05-04 DIAGNOSIS — I6529 Occlusion and stenosis of unspecified carotid artery: Secondary | ICD-10-CM

## 2017-05-04 DIAGNOSIS — C7911 Secondary malignant neoplasm of bladder: Secondary | ICD-10-CM | POA: Diagnosis not present

## 2017-05-04 DIAGNOSIS — R748 Abnormal levels of other serum enzymes: Secondary | ICD-10-CM | POA: Diagnosis not present

## 2017-05-04 DIAGNOSIS — Z794 Long term (current) use of insulin: Secondary | ICD-10-CM

## 2017-05-04 DIAGNOSIS — J90 Pleural effusion, not elsewhere classified: Secondary | ICD-10-CM

## 2017-05-04 DIAGNOSIS — E1165 Type 2 diabetes mellitus with hyperglycemia: Secondary | ICD-10-CM | POA: Diagnosis not present

## 2017-05-04 DIAGNOSIS — M899 Disorder of bone, unspecified: Secondary | ICD-10-CM

## 2017-05-04 DIAGNOSIS — R918 Other nonspecific abnormal finding of lung field: Secondary | ICD-10-CM

## 2017-05-04 DIAGNOSIS — N320 Bladder-neck obstruction: Secondary | ICD-10-CM | POA: Diagnosis not present

## 2017-05-04 DIAGNOSIS — Z79899 Other long term (current) drug therapy: Secondary | ICD-10-CM

## 2017-05-04 DIAGNOSIS — Z466 Encounter for fitting and adjustment of urinary device: Secondary | ICD-10-CM | POA: Diagnosis not present

## 2017-05-04 DIAGNOSIS — Z436 Encounter for attention to other artificial openings of urinary tract: Secondary | ICD-10-CM | POA: Diagnosis not present

## 2017-05-04 DIAGNOSIS — E1151 Type 2 diabetes mellitus with diabetic peripheral angiopathy without gangrene: Secondary | ICD-10-CM | POA: Diagnosis not present

## 2017-05-04 LAB — COMPREHENSIVE METABOLIC PANEL
ALT: 25 U/L (ref 17–63)
AST: 24 U/L (ref 15–41)
Albumin: 3.2 g/dL — ABNORMAL LOW (ref 3.5–5.0)
Alkaline Phosphatase: 465 U/L — ABNORMAL HIGH (ref 38–126)
Anion gap: 8 (ref 5–15)
BUN: 21 mg/dL — ABNORMAL HIGH (ref 6–20)
CO2: 22 mmol/L (ref 22–32)
Calcium: 8 mg/dL — ABNORMAL LOW (ref 8.9–10.3)
Chloride: 96 mmol/L — ABNORMAL LOW (ref 101–111)
Creatinine, Ser: 0.82 mg/dL (ref 0.61–1.24)
GFR calc Af Amer: >60 mL/min (ref >60)
GFR calc non Af Amer: >60 mL/min (ref >60)
Glucose, Bld: 268 mg/dL — ABNORMAL HIGH (ref 65–99)
Potassium: 4.7 mmol/L (ref 3.5–5.1)
Sodium: 126 mmol/L — ABNORMAL LOW (ref 135–145)
Total Bilirubin: 0.4 mg/dL (ref 0.3–1.2)
Total Protein: 7.7 g/dL (ref 6.5–8.1)

## 2017-05-04 LAB — CBC WITH DIFFERENTIAL/PLATELET
Basophils Absolute: 0 10*3/uL (ref 0–0.1)
Basophils Relative: 1 %
Eosinophils Absolute: 0 10*3/uL (ref 0–0.7)
Eosinophils Relative: 1 %
HCT: 31 % — ABNORMAL LOW (ref 40.0–52.0)
Hemoglobin: 10.5 g/dL — ABNORMAL LOW (ref 13.0–18.0)
Lymphocytes Relative: 15 %
Lymphs Abs: 1.1 10*3/uL (ref 1.0–3.6)
MCH: 28.6 pg (ref 26.0–34.0)
MCHC: 33.8 g/dL (ref 32.0–36.0)
MCV: 84.7 fL (ref 80.0–100.0)
Monocytes Absolute: 0.9 10*3/uL (ref 0.2–1.0)
Monocytes Relative: 11 %
Neutro Abs: 5.5 10*3/uL (ref 1.4–6.5)
Neutrophils Relative %: 72 %
Platelets: 279 10*3/uL (ref 150–440)
RBC: 3.66 MIL/uL — ABNORMAL LOW (ref 4.40–5.90)
RDW: 16.6 % — ABNORMAL HIGH (ref 11.5–14.5)
WBC: 7.6 10*3/uL (ref 3.8–10.6)

## 2017-05-04 LAB — PSA: Prostatic Specific Antigen: 1.8 ng/mL (ref 0.00–4.00)

## 2017-05-04 NOTE — Assessment & Plan Note (Addendum)
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  PET scan March 2019-pelvic/prostate uptake; diffuse bony uptake; without any bony lesions noted on CT imaging [see discussion below].   #Given the bulky disease/visceral metastases-I would recommend docetaxel every 3 weeks.   Discussed the potential side effects including but not limited to-increasing fatigue, nausea vomiting, diarrhea, hair loss, sores in the mouth, increase risk of infection and also neuropathy.  Discussed regarding steroid premedication/and importance of blood glucose control.  The understands treatments are palliative not curative.  #  Growth factor-udenyca; would be given as prophylaxis for chemotherapy-induced neutropenia to prevent febrile neutropenias.   #Hyponatremia sodium 127-recommend increased salt intake/L appliance.  # poorly controlled Blood glucose-patient is on lantus/ novolo;  lantus 14-16 at night ; continue humalog scale.  Discussed at length regarding close monitoring of blood sugars while on steroids peri- chemotherapy.  # Dry mout unclear etiology h-recommend frequent sips of water  # bone mets on PET scan; no obvious CT correlate-hold any further imaging or biopsy at this time- Hold x-geva-  given hypocalcemia.  # Elevated alkaline phosphatase-bony metastases versus others.   # plan chemo in next week; chemo ed; taxoetere; MD/ 10 days- labs/degarelix [3/26]; stop casodex.

## 2017-05-04 NOTE — Progress Notes (Signed)
[place] NOTE  Patient Care Team: [name], MD as PCP - General (Internal Medicine)  CHIEF COMPLAINTS/PURPOSE OF CONSULTATION:  Metastatic prostate cancer  #  Oncology History   # FEB 2019-Metastatic prostate cancer/ castrate sensitive [bulky retroperitoneal adenopathy; invasion into the bladder; rectum] Bladder Bx- prostate.    # Poorly controlled DM-on insulin  # s/p right PCN/ Foley cath [Dr.Brandon]     Prostate cancer ([place])     HISTORY OF PRESENTING ILLNESS:  Michael [name] 84 y.o.  male with a recently diagnosed metastatic prostate cancer is here for follow-up/reviewed the results of the PET scan/discuss treatment options.  Patient states he has mild to moderate back pain for which he has been taking Tylenol.  In general his appetite is improving.  No nausea vomiting.   As per the family-his diabetes is better controlled.  He has been recently evaluated by PCP-adjusted insulin dosing.  He has an appointment with urology next week with regards to his Foley catheter/nephrostomy tube.  ROS: A complete 10 point review of system is done which is negative except mentioned above in history of present illness  MEDICAL HISTORY:  Past Medical History:  Diagnosis Date  . Benign prostatic hypertrophy   . Bilateral carotid artery disease (HCC)    Mild plaque formation without obstructive disease noted on carotid Doppler.  . Coronary artery disease    Previous cardiac catheterization at [facility] in 2010. The patient was told about 2 blockages which did not require revascularization.  . Diabetes mellitus without complication ([place])   . Essential hypertension   . GERD (gastroesophageal reflux disease)   . Hyperlipidemia   . Hypertension   . Left carotid artery stenosis     SURGICAL HISTORY: Past Surgical History:  Procedure Laterality Date  . CARDIAC CATHETERIZATION  2010  . CARDIAC CATHETERIZATION N/A 02/03/2015   Procedure: Left Heart Cath  and Coronary Angiography;  Surgeon: Wellington [name], MD;  Location: [facility];  Service: Cardiovascular;  Laterality: N/A;  . CATARACT EXTRACTION    . CYSTOSCOPY W/ RETROGRADES Bilateral 04/11/2017   Procedure: CYSTOSCOPY WITH RETROGRADE PYELOGRAM;  Surgeon: [name], MD;  Location: [facility];  Service: Urology;  Laterality: Bilateral;  . CYSTOSCOPY WITH STENT PLACEMENT Left 04/11/2017   Procedure: CYSTOSCOPY WITH STENT PLACEMENT and fulgeration;  Surgeon: [name], MD;  Location: [facility];  Service: Urology;  Laterality: Left;  . CYSTOSCOPY WITH URETHRAL DILATATION Bilateral 04/11/2017   Procedure: CYSTOSCOPY WITH URETHRAL DILATATION;  Surgeon: [name], MD;  Location: [facility];  Service: Urology;  Laterality: Bilateral;  . IR NEPHROSTOMY PLACEMENT RIGHT  04/13/2017  . PROSTATECTOMY      SOCIAL HISTORY: Social History   Socioeconomic History  . Marital status: Widowed    Spouse name: Not on file  . Number of children: Not on file  . Years of education: Not on file  . Highest education level: Not on file  Social Needs  . Financial resource strain: Not on file  . Food insecurity - worry: Not on file  . Food insecurity - inability: Not on file  . Transportation needs - medical: Not on file  . Transportation needs - non-medical: Not on file  Occupational History  . Not on file  Tobacco Use  . Smoking status: Never Smoker  . Smokeless tobacco: Never Used  Substance and Sexual Activity  . Alcohol use: No  . Drug use: No  . Sexual activity: Not on file  Other  Topics Concern  . Not on file  Social History Narrative  . Not on file    FAMILY HISTORY: Family History  Problem Relation Age of Onset  . Hypertension Father     ALLERGIES:  is allergic to zosyn [piperacillin sod-tazobactam so].  MEDICATIONS:  Current Outpatient Medications  Medication Sig Dispense Refill  . albuterol (PROVENTIL HFA;VENTOLIN HFA) 108 (90 Base) MCG/ACT inhaler Inhale 2  puffs into the lungs every 6 (six) hours as needed for wheezing or shortness of breath. 1 Inhaler 2  . Artificial Saliva (SALIVASURE) LOZG Use as directed 1 lozenge in the mouth or throat 3 (three) times daily. 30 lozenge 2  . aspirin (GOODSENSE ASPIRIN) 325 MG tablet Take 325 mg by mouth daily.     [name] atorvastatin (LIPITOR) 10 MG tablet Take 10 mg by mouth daily.    . bicalutamide (CASODEX) 50 MG tablet Take 1 tablet (50 mg total) by mouth daily. 30 tablet 0  . Calcium-Vitamin D 600-200 MG-UNIT tablet Take by mouth.    . cetirizine (ZYRTEC) 10 MG tablet Take 1 tablet (10 mg total) by mouth daily. 30 tablet 6  . feeding supplement, GLUCERNA SHAKE, (GLUCERNA SHAKE) LIQD Take 237 mLs by mouth 3 (three) times daily between meals. 90 Can 5  . finasteride (PROSCAR) 5 MG tablet TAKE 1 TABLET (5 MG TOTAL) BY MOUTH DAILY.  3  . glucose blood (ACCU-CHEK GUIDE) test strip Use asdirected three times a day diag E11.65 100 each 3  . Insulin Glargine (BASAGLAR KWIKPEN) 100 UNIT/ML SOPN Inject into the skin at bedtime.    . insulin lispro (HUMALOG) 100 UNIT/ML injection Inject into the skin 2 (two) times daily.    . Insulin Syringe-Needle U-100 (B-D INS SYR ULTRAFINE 1CC/31G) 31G X 5/16" 1 ML MISC Use as directed 100 each 0  . lisinopril (PRINIVIL,ZESTRIL) 2.5 MG tablet Take 2.5 mg by mouth daily.  4  . metoprolol succinate (TOPROL-XL) 25 MG 24 hr tablet Take 25 mg by mouth daily.   6  . Multiple Vitamins-Minerals (MULTIVITAMIN WITH MINERALS) tablet Take 1 tablet by mouth daily.     [name] omeprazole (PRILOSEC) 20 MG capsule Take 20 mg by mouth daily.  1  . dexamethasone (DECADRON) 4 MG tablet 1 tablet AM & PM; Take Day prior and day after chemo. 30 tablet 0  . ondansetron (ZOFRAN) 8 MG tablet One pill every 8 hours as needed for nausea/vomitting. 40 tablet 1  . prochlorperazine (COMPAZINE) 10 MG tablet Take 1 tablet (10 mg total) by mouth every 6 (six) hours as needed for nausea or vomiting. 40 tablet 1   No  current facility-administered medications for this visit.       [name]  PHYSICAL EXAMINATION: ECOG PERFORMANCE STATUS: 2 - Symptomatic, <50% confined to bed  Vitals:   05/04/17 1005  BP: 116/69  Pulse: 89  Resp: 16  Temp: 97.6 F (36.4 C)   Filed Weights   05/04/17 1005  Weight: 127 lb 11.2 oz (57.9 kg)    GENERAL: Well-nourished well-developed; Alert, no distress and comfortable.   Accompanied by daughter/family. EYES: no pallor or icterus OROPHARYNX: no thrush or ulceration; good dentition  NECK: supple, no masses felt LYMPH:  no palpable lymphadenopathy in the cervical, axillary or inguinal regions LUNGS: clear to auscultation and  No wheeze or crackles HEART/CVS: regular rate & rhythm and no murmurs; No lower extremity edema ABDOMEN: abdomen soft, non-tender and normal bowel sounds; Foley catheter in place; percutaneous nephrostomy tube in place Musculoskeletal:no  cyanosis of digits and no clubbing  PSYCH: alert & oriented x 3 with fluent speech NEURO: no focal motor/sensory deficits SKIN:  no rashes or significant lesions  LABORATORY DATA:  I have reviewed the data as listed Lab Results  Component Value Date   WBC 7.6 05/04/2017   HGB 10.5 (L) 05/04/2017   HCT 31.0 (L) 05/04/2017   MCV 84.7 05/04/2017   PLT 279 05/04/2017   Recent Labs    04/11/17 1210  04/13/17 0403 04/20/17 0814 05/04/17 0938  NA 132*   < > 135 133* 126*  K 3.9   < > 3.5 4.6 4.7  CL 98*   < > 104 100* 96*  CO2 22   < > 22 26 22   GLUCOSE 289*   < > 139* 348* 268*  BUN 32*   < > 18 20 21*  CREATININE 1.42*   < > 1.00 0.87 0.82  CALCIUM 8.4*   < > 7.9* 8.4* 8.0*  GFRNONAA 45*   < > >60 >60 >60  GFRAA 52*   < > >60 >60 >60  PROT 7.7  --   --  8.0 7.7  ALBUMIN 3.1*  --   --  3.1* 3.2*  AST 37  --   --  28 24  ALT 19  --   --  29 25  ALKPHOS 107  --   --  136* 465*  BILITOT 0.5  --   --  0.3 0.4   < > = values in this interval not displayed.    RADIOGRAPHIC STUDIES: I have personally  reviewed the radiological images as listed and agreed with the findings in the report. Dg Chest 2 View  Result Date: 04/08/2017 CLINICAL DATA:  Left flank pain radiates to bladder. EXAM: CHEST  2 VIEW COMPARISON:  12/14/2016 FINDINGS: The lungs are clear without focal pneumonia, edema, pneumothorax or pleural effusion. The cardiopericardial silhouette is within normal limits for size. The visualized bony structures of the thorax are intact. Telemetry leads overlie the chest. IMPRESSION: No active cardiopulmonary disease. Electronically Signed   By: Misty Stanley M.D.   On: 04/08/2017 10:57   [place] Intraoperative  Result Date: 04/13/2017 CLINICAL DATA:  Ultrasound was provided for use by the ordering physician, and a technical charge was applied by the performing facility.  No radiologist interpretation/professional services rendered.   Ct Abdomen Pelvis W Contrast  Result Date: 04/11/2017 CLINICAL DATA:  Abdominal pain with radiation to his back. Symptoms for 6 weeks. Dysuria. Chronic diarrhea. Recently diagnosed with flu. Anemic. Prior prostatectomy per report. EXAM: CT ABDOMEN AND PELVIS WITH CONTRAST TECHNIQUE: Multidetector CT imaging of the abdomen and pelvis was performed using the standard protocol following bolus administration of intravenous contrast. CONTRAST:  5mL ISOVUE-300 IOPAMIDOL (ISOVUE-300) INJECTION 61% COMPARISON:  Chest CT 03/01/2015. No prior dedicated abdominopelvic imaging. FINDINGS: Lower chest: Right middle lobe pulmonary nodule of 6 mm on image 3/4. Suspect centrilobular emphysema. Normal heart size with tiny left pleural effusion. Right coronary artery atherosclerosis. Retrocrural adenopathy at 10 mm on image 15/2. Hepatobiliary: Subtle irregular hepatic capsule may be artifactual and related to CT reconstruction technique. No focal liver lesion. Normal gallbladder, without biliary ductal dilatation. Pancreas: Normal, without mass or ductal dilatation. Spleen: Normal in size,  without focal abnormality. Adrenals/Urinary Tract: Mild right adrenal thickening. Normal left adrenal gland. Too small to characterize interpolar right renal lesion is most likely a cyst. Moderate bilateral hydroureteronephrosis. This is slightly worse on the left, with marked left perinephric  interstitial thickening, possibly related to forniceal rupture. Hydroureter is followed only to the upper abdomen, likely obstructed by retroperitoneal adenopathy. Bladder tumor is eccentric right, including on image 62/2. There is more inferior masslike right bladder placed tumor including at 4.4 x 3.5 cm on image 67/2. Stomach/Bowel: Proximal gastric underdistention. Rectal wall thickening is contiguous with bladder and prostatic soft tissue fullness, including on image 71/series 2. There is a large amount of colonic stool within the more proximal colon, suggesting a component of low-grade obstruction. Normal terminal ileum. Normal small bowel. Vascular/Lymphatic: Advanced aortic and branch vessel atherosclerosis. Bulky retroperitoneal adenopathy. Left periaortic nodal tissue surrounds the aorta and measures 5.0 x 3.6 cm on image 39/2. Marked pelvic sidewall adenopathy, including a right external iliac nodal mass of 3.9 x 5.4 cm on image 57/2. Reproductive: Despite the clinical history, the prostate appears present including on image 72/series 2. Soft tissue is positioned between the posterior right bladder wall and rectum, contiguous with the superior aspect of the prostate and right seminal vesicle. Example image 66/2. Other: Perirectal edema. No abdominopelvic ascites. Tiny fat containing left inguinal hernia. Musculoskeletal: Bilateral L5 pars defects with grade 2 L5-S1 anterolisthesis. Heterogeneous marrow density, including in the right-side of the L1 vertebral body on image 47/6. IMPRESSION: 1. 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 Infiltrative soft tissue involves the bladder, rectum, and superior  aspect of the prostate. Bladder primary is slightly favored. Prostatic or rectal primary is felt less likely. Given the clinical history and appearance of the rectum, fistulous communication between bladder and rectum cannot be excluded. No gas within the bladder to confirm fistula. 2. Suspect a component of bowel obstruction at the level of rectal involvement. 3. Extensive nodal metastasis within the abdomen and retrocrural space of the lower chest. 4. Bilateral hydronephrosis, likely related to retroperitoneal adenopathy. Extensive left perirenal edema, suggesting forniceal rupture. 5. Tiny left pleural effusion. 6. Coronary artery atherosclerosis. Aortic Atherosclerosis (ICD10-I70.0). 7. Suspect osseous metastasis, most apparent at L1. 8. Nonspecific right middle lobe pulmonary nodule. These results will be called to the ordering clinician or representative by the Radiologist Assistant, and communication documented in the PACS or zVision Dashboard. Electronically Signed   By: Abigail Miyamoto M.D.   On: 04/11/2017 16:00   US Renal  Result Date: 04/12/2017 CLINICAL DATA:  History of metastatic disease from primary pelvic lesion, hydronephrosis EXAM: RENAL / URINARY TRACT ULTRASOUND COMPLETE COMPARISON:  CT abdomen pelvis of 04/19/2017 FINDINGS: Right Kidney: Length: 10.0 cm. Moderate hydronephrosis is present. The renal parenchyma echogenicity is unremarkable. Left Kidney: Length: 10.1 cm. No hydronephrosis is currently seen. The echogenicity of the renal parenchyma is within normal limits. Bladder: The urinary bladder is decompressed with Foley catheter present. Urinary bladder wall does appear to be diffusely thickened. IMPRESSION: 1. Right-sided moderate hydronephrosis.  No left hydronephrosis. 2. The urinary bladder is decompressed but does appear thick walled. Electronically Signed   By: Ivar Drape M.D.   On: 04/12/2017 17:13   Dg Chest Port 1 View  Result Date: 04/14/2017 CLINICAL DATA:  Non smoker,  neph tube(s) placed yesterday, now with SOBPer chart: a known history of CAD, peripheral vascular disease, diabetes, hypertension, influenza a and hyperlipidemia admitted for abdominal pain and found to have incidental advanced metastatic disease EXAM: PORTABLE CHEST 1 VIEW COMPARISON:  04/08/2017 FINDINGS: Cardiac silhouette is normal in size. No mediastinal or hilar masses. No evidence of adenopathy. Clear lungs.  No pleural effusion or pneumothorax. Skeletal structures are grossly intact. IMPRESSION: No active disease.  Electronically Signed   By: Lajean Manes M.D.   On: 04/14/2017 15:30   Nm Pet (axumin) Skull Base To Mid Thigh  Result Date: 04/26/2017 CLINICAL DATA:  Prostate carcinoma with biochemical recurrence. Recent diagnosis of prostate cancer. RIGHT nephrostomy tube. History of prostate surgery. EXAM: NUCLEAR MEDICINE PET SKULL BASE TO THIGH TECHNIQUE: 9.2 mCi F-18 Fluciclovine was injected intravenously. Full-ring PET imaging was performed from the skull base to thigh after the radiotracer. CT data was obtained and used for attenuation correction and anatomic localization. COMPARISON:  CT 04/11/2017 FINDINGS: NECK No radiotracer activity in neck lymph nodes. CHEST No radiotracer accumulation within mediastinal or hilar lymph nodes. No suspicious pulmonary nodules on the CT scan. ABDOMEN/PELVIS: Moderate diffuse radiotracer activity within the prostate gland without focality (SUV max equal 5.1). Foley catheter extends through the prostate gland into the bladder. Intense radiotracer activity accumulates within the enlarged iliac lymph nodes. For example 2.7 cm short axis LEFT operator/iliac node with SUV max equal 7.4. Similar RIGHT external iliac lymph node with SUV max equal 7.8. Adenopathy extends along the iliac chain to the aortic bifurcation. Lymph node at the bifurcation with SUV max equal 6.1. Intense radiotracer activity associated with LEFT periaortic lymph nodes. Example cluster of lymph  nodes LEFT aorta at the level of the LEFT kidney with SUV max equal 7.0. The most superior metastatic lymph node is at the LEFT renal vein. No focal abnormal activity within the liver. Physiologic activity noted within the pancreas and liver. Percutaneous nephrostomy tube on the RIGHT. Double-J ureteral stent on the LEFT. SKELETON There is diffuse radiotracer activity throughout the skeleton with some heterogeneity of signal intensity. There is diffuse sclerosis of the bones in the spine and pelvis. No discrete lesions identified by CT or PET data set. IMPRESSION: 1. Bulky bilateral iliac and periaortic lymphadenopathy with intense radiotracer activity consists with prostate cancer metastasis. The highest metastatic lymph nodes at the level of the LEFT renal vein in the periaortic retroperitoneum 2. Mild diffuse activity in the prostate gland is non-specific. 3. No additional soft tissue/visceral metastasis. 4. Diffuse radiotracer activity within the marrow space as well as diffuse sclerosis. Differential would include metabolic disorder, hyperplasia from anemia or diffuse prostate cancer metastasis. MRI may or may not be helpful. Consider biopsy if clinically relevant. Electronically Signed   By: Suzy Bouchard M.D.   On: 04/26/2017 16:38   Ir Nephrostomy Placement Right  Result Date: 04/13/2017 INDICATION: History of metastatic prostate cancer, now with bilateral obstructive uropathy. Patient was able to undergo successful left-sided retrograde ureteral stent placement however failed attempted right-sided retrograde stent placement secondary to nonvisualization of the right urinary bladder trigone. As such, request made for image guided placement of a right-sided percutaneous nephrostomy catheter. EXAM: 1. ULTRASOUND GUIDANCE FOR PUNCTURE OF THE RIGHT RENAL COLLECTING SYSTEM 2. RIGHT PERCUTANEOUS NEPHROSTOMY TUBE PLACEMENT. COMPARISON:  CT abdomen pelvis - 04/11/2017; renal ultrasound - 04/12/2017  MEDICATIONS: Ciprofloxacin 400 mg IV; the antibiotic was administered in an appropriate time frame prior to skin puncture. ANESTHESIA/SEDATION: Moderate (conscious) sedation was employed during this procedure. A total of Versed 2 mg and Fentanyl 75 mcg was administered intravenously. Moderate Sedation Time: 16 minutes. The patient's level of consciousness and vital signs were monitored continuously by radiology nursing throughout the procedure under my direct supervision. CONTRAST:  20 mL Isovue 300 administered into the collecting system FLUOROSCOPY TIME:  1 minute 36 seconds (025 mGy) COMPLICATIONS: None immediate. PROCEDURE: The procedure, risks, benefits, and alternatives were explained to the  patient. Questions regarding the procedure were encouraged and answered. The patient understands and consents to the procedure. A timeout was performed prior to the initiation of the procedure. The right flank region was prepped with Betadine in a sterile fashion, and a sterile drape was applied covering the operative field. A sterile gown and sterile gloves were used for the procedure. Local anesthesia was provided with 1% Lidocaine with epinephrine. Ultrasound was used to localize the right kidney. Under direct ultrasound guidance, a 21 gauge needle was advanced into the renal collecting system. An ultrasound image documentation was performed. Access within the collecting system was confirmed with the efflux of urine followed by contrast injection. Over a Nitrex wire, the inner three [place] catheter of an Accustick set was advanced into the renal collecting system. Contrast injection was injected into the collecting system as several spot radiographs were obtained in various obliquities confirming puncture within a posterior inferior calix. As such, the tract was dilated with an Accustick stent. Over a guide wire, a 10-French percutaneous nephrostomy catheter was advanced into the collecting system where the coil was  formed and locked. Contrast was injected and several sport radiographs were obtained in various obliquities confirming access. The catheter was secured at the skin with a Prolene retention suture and a gravity bag was placed. A dressing was placed. The patient tolerated procedure well without immediate postprocedural complication. FINDINGS: Ultrasound scanning demonstrates a moderate dilated right collecting system. Under direct ultrasound guidance, a posterior inferior calix was targeted allowing advancement of an 10-French percutaneous nephrostomy catheter under intermittent fluoroscopic guidance. Contrast injection confirmed appropriate positioning. IMPRESSION: Successful ultrasound and fluoroscopic guided placement of a right sided 10 French PCN. Electronically Signed   By: Sandi Mariscal M.D.   On: 04/13/2017 13:33    ASSESSMENT & PLAN:   Prostate cancer [facility]) # Metastatic prostate cancer/ castrate sensitive [bulky retroperitoneal adenopathy; invasion into the bladder; rectum].  PET scan March 2019-pelvic/prostate uptake; diffuse bony uptake; without any bony lesions noted on CT imaging [see discussion below].   #Given the bulky disease/visceral metastases-I would recommend docetaxel every 3 weeks.   Discussed the potential side effects including but not limited to-increasing fatigue, nausea vomiting, diarrhea, hair loss, sores in the mouth, increase risk of infection and also neuropathy.  Discussed regarding steroid premedication/and importance of blood glucose control.  The understands treatments are palliative not curative.  #  Growth factor-udenyca; would be given as prophylaxis for chemotherapy-induced neutropenia to prevent febrile neutropenias.   #Hyponatremia sodium 127-recommend increased salt intake/L appliance.  # poorly controlled Blood glucose-patient is on lantus/ novolo;  lantus 14-16 at night ; continue humalog scale.  Discussed at length regarding close monitoring of blood sugars  while on steroids peri- chemotherapy.  # Dry mout unclear etiology h-recommend frequent sips of water  # bone mets on PET scan; no obvious CT correlate-hold any further imaging or biopsy at this time- Hold x-geva-  given hypocalcemia.  # Elevated alkaline phosphatase-bony metastases versus others.   # plan chemo in next week; chemo ed; taxoetere; MD/ 10 days- labs/degarelix [3/26]; stop casodex.    All questions were answered. The patient knows to call the clinic with any problems, questions or concerns.   [name], MD 05/05/2017 11:01 PM

## 2017-05-05 ENCOUNTER — Other Ambulatory Visit: Payer: Self-pay | Admitting: Internal Medicine

## 2017-05-05 MED ORDER — PROCHLORPERAZINE MALEATE 10 MG PO TABS: 10.0000 mg | ORAL_TABLET | Freq: Four times a day (QID) | ORAL | 1 refills | Status: DC | PRN

## 2017-05-05 MED ORDER — ONDANSETRON HCL 8 MG PO TABS: ORAL_TABLET | ORAL | 1 refills | Status: DC

## 2017-05-05 MED ORDER — DEXAMETHASONE 4 MG PO TABS: ORAL_TABLET | ORAL | 0 refills | Status: DC

## 2017-05-05 NOTE — Progress Notes (Signed)
START ON PATHWAY REGIMEN - Prostate   Docetaxel 75 mg/m2:   A cycle is every 21 days:     Docetaxel   **Always confirm dose/schedule in your [facility] + Bicalutamide:   A cycle is every 12 weeks:     Leuprolide acetate depot    Daily:     Bicalutamide   **Always confirm dose/schedule in your pharmacy ordering system**    Patient Characteristics: Adenocarcinoma, Metastatic, Hormone Naive, High Volume Disease* Current radiographic evidence of distant metastasis<= Yes Histology: Adenocarcinoma AJCC T Category: cTX Gleason Primary: X AJCC N Category: N1 Gleason Secondary: X AJCC M Category: M1 Gleason Score: X AJCC 8 Stage Grouping: IVB PSA Values (ng/mL): < 10 Would you be surprised if this patient died  in the next year<= I would be surprised if this patient died in the next year Intent of Therapy: Non-Curative / Palliative Intent, Discussed with Patient

## 2017-05-05 NOTE — Progress Notes (Signed)
#   chemo- taxotere with udenyca.  Heather-please inform patient's daughter that I have ordered udenyca after chemotherapy.  Also talked to her about myalgias-need to take Claritin for about 1 week starting the day of treatment.   Since patient is getting chemotherapy on Friday morning; udenyca has to be given Monday morning [within 72 hours]  Please make appt for pt to have the shot on Monday 3/25 [appx 9 or so].

## 2017-05-07 ENCOUNTER — Encounter: Payer: Self-pay | Admitting: Emergency Medicine

## 2017-05-07 ENCOUNTER — Emergency Department: Payer: Medicare Other

## 2017-05-07 ENCOUNTER — Other Ambulatory Visit: Payer: Self-pay

## 2017-05-07 ENCOUNTER — Emergency Department: Admission: EM | Admit: 2017-05-07 | Discharge: 2017-05-07 | Discharge status: Absent without leave | Payer: Self-pay

## 2017-05-07 ENCOUNTER — Telehealth: Payer: Self-pay | Admitting: Urology

## 2017-05-07 ENCOUNTER — Inpatient Hospital Stay
Admission: EM | Admit: 2017-05-07 | Discharge: 2017-05-09 | DRG: 699 | Disposition: A | Discharge status: Home Health | Payer: Medicare Other | Attending: Internal Medicine | Admitting: Internal Medicine

## 2017-05-07 DIAGNOSIS — Y846 Urinary catheterization as the cause of abnormal reaction of the patient, or of later complication, without mention of misadventure at the time of the procedure: Secondary | ICD-10-CM | POA: Diagnosis present

## 2017-05-07 DIAGNOSIS — E871 Hypo-osmolality and hyponatremia: Secondary | ICD-10-CM

## 2017-05-07 DIAGNOSIS — C61 Malignant neoplasm of prostate: Secondary | ICD-10-CM

## 2017-05-07 DIAGNOSIS — T83512A Infection and inflammatory reaction due to nephrostomy catheter, initial encounter: Secondary | ICD-10-CM

## 2017-05-07 DIAGNOSIS — C7911 Secondary malignant neoplasm of bladder: Secondary | ICD-10-CM | POA: Diagnosis present

## 2017-05-07 DIAGNOSIS — N390 Urinary tract infection, site not specified: Secondary | ICD-10-CM

## 2017-05-07 DIAGNOSIS — B9620 Unspecified Escherichia coli [E. coli] as the cause of diseases classified elsewhere: Secondary | ICD-10-CM | POA: Diagnosis present

## 2017-05-07 DIAGNOSIS — E43 Unspecified severe protein-calorie malnutrition: Secondary | ICD-10-CM

## 2017-05-07 DIAGNOSIS — E119 Type 2 diabetes mellitus without complications: Secondary | ICD-10-CM | POA: Diagnosis present

## 2017-05-07 DIAGNOSIS — Z79899 Other long term (current) drug therapy: Secondary | ICD-10-CM

## 2017-05-07 DIAGNOSIS — R509 Fever, unspecified: Secondary | ICD-10-CM | POA: Diagnosis not present

## 2017-05-07 DIAGNOSIS — Z1612 Extended spectrum beta lactamase (ESBL) resistance: Secondary | ICD-10-CM | POA: Diagnosis present

## 2017-05-07 DIAGNOSIS — I2510 Atherosclerotic heart disease of native coronary artery without angina pectoris: Secondary | ICD-10-CM | POA: Diagnosis present

## 2017-05-07 DIAGNOSIS — E785 Hyperlipidemia, unspecified: Secondary | ICD-10-CM | POA: Diagnosis present

## 2017-05-07 DIAGNOSIS — Z794 Long term (current) use of insulin: Secondary | ICD-10-CM | POA: Diagnosis not present

## 2017-05-07 DIAGNOSIS — Z8249 Family history of ischemic heart disease and other diseases of the circulatory system: Secondary | ICD-10-CM

## 2017-05-07 DIAGNOSIS — N3000 Acute cystitis without hematuria: Secondary | ICD-10-CM | POA: Diagnosis present

## 2017-05-07 DIAGNOSIS — R109 Unspecified abdominal pain: Secondary | ICD-10-CM | POA: Diagnosis not present

## 2017-05-07 DIAGNOSIS — K219 Gastro-esophageal reflux disease without esophagitis: Secondary | ICD-10-CM | POA: Diagnosis present

## 2017-05-07 DIAGNOSIS — Z880 Allergy status to penicillin: Secondary | ICD-10-CM

## 2017-05-07 DIAGNOSIS — T83511A Infection and inflammatory reaction due to indwelling urethral catheter, initial encounter: Secondary | ICD-10-CM | POA: Diagnosis not present

## 2017-05-07 DIAGNOSIS — I10 Essential (primary) hypertension: Secondary | ICD-10-CM | POA: Diagnosis present

## 2017-05-07 DIAGNOSIS — N400 Enlarged prostate without lower urinary tract symptoms: Secondary | ICD-10-CM | POA: Diagnosis present

## 2017-05-07 DIAGNOSIS — C785 Secondary malignant neoplasm of large intestine and rectum: Secondary | ICD-10-CM | POA: Diagnosis present

## 2017-05-07 DIAGNOSIS — R05 Cough: Secondary | ICD-10-CM | POA: Diagnosis not present

## 2017-05-07 LAB — COMPREHENSIVE METABOLIC PANEL
ALT: 29 U/L (ref 17–63)
AST: 30 U/L (ref 15–41)
Albumin: 3 g/dL — ABNORMAL LOW (ref 3.5–5.0)
Alkaline Phosphatase: 522 U/L — ABNORMAL HIGH (ref 38–126)
Anion gap: 9 (ref 5–15)
BUN: 22 mg/dL — ABNORMAL HIGH (ref 6–20)
CO2: 20 mmol/L — ABNORMAL LOW (ref 22–32)
Calcium: 7.6 mg/dL — ABNORMAL LOW (ref 8.9–10.3)
Chloride: 95 mmol/L — ABNORMAL LOW (ref 101–111)
Creatinine, Ser: 1 mg/dL (ref 0.61–1.24)
GFR calc Af Amer: >60 mL/min (ref >60)
GFR calc non Af Amer: >60 mL/min (ref >60)
Glucose, Bld: 274 mg/dL — ABNORMAL HIGH (ref 65–99)
Potassium: 4 mmol/L (ref 3.5–5.1)
Sodium: 124 mmol/L — ABNORMAL LOW (ref 135–145)
Total Bilirubin: 0.5 mg/dL (ref 0.3–1.2)
Total Protein: 7.9 g/dL (ref 6.5–8.1)

## 2017-05-07 LAB — CBC WITH DIFFERENTIAL/PLATELET
Basophils Absolute: 0 10*3/uL (ref 0–0.1)
Basophils Relative: 0 %
Eosinophils Absolute: 0 10*3/uL (ref 0–0.7)
Eosinophils Relative: 1 %
HCT: 31.7 % — ABNORMAL LOW (ref 40.0–52.0)
Hemoglobin: 10.4 g/dL — ABNORMAL LOW (ref 13.0–18.0)
Lymphocytes Relative: 14 %
Lymphs Abs: 1.1 10*3/uL (ref 1.0–3.6)
MCH: 27.5 pg (ref 26.0–34.0)
MCHC: 32.8 g/dL (ref 32.0–36.0)
MCV: 83.9 fL (ref 80.0–100.0)
Monocytes Absolute: 0.9 10*3/uL (ref 0.2–1.0)
Monocytes Relative: 13 %
Neutro Abs: 5.4 10*3/uL (ref 1.4–6.5)
Neutrophils Relative %: 72 %
Platelets: 255 10*3/uL (ref 150–440)
RBC: 3.78 MIL/uL — ABNORMAL LOW (ref 4.40–5.90)
RDW: 16.4 % — ABNORMAL HIGH (ref 11.5–14.5)
WBC: 7.5 10*3/uL (ref 3.8–10.6)

## 2017-05-07 LAB — URINALYSIS, COMPLETE (UACMP) WITH MICROSCOPIC
Bilirubin Urine: NEGATIVE
Bilirubin Urine: NEGATIVE
Glucose, UA: 150 mg/dL — AB
Glucose, UA: >=500 mg/dL — AB
Ketones, ur: NEGATIVE mg/dL
Ketones, ur: NEGATIVE mg/dL
Nitrite: POSITIVE — AB
Nitrite: POSITIVE — AB
Protein, ur: 100 mg/dL — AB
Protein, ur: 30 mg/dL — AB
Specific Gravity, Urine: 1.006 (ref 1.005–1.030)
Specific Gravity, Urine: 1.018 (ref 1.005–1.030)
Squamous Epithelial / LPF: NONE SEEN
Squamous Epithelial / LPF: NONE SEEN
pH: 5 (ref 5.0–8.0)
pH: 6 (ref 5.0–8.0)

## 2017-05-07 LAB — GLUCOSE, CAPILLARY
Glucose-Capillary: 218 mg/dL — ABNORMAL HIGH (ref 65–99)
Glucose-Capillary: 259 mg/dL — ABNORMAL HIGH (ref 65–99)
Glucose-Capillary: 299 mg/dL — ABNORMAL HIGH (ref 65–99)

## 2017-05-07 LAB — LACTIC ACID, PLASMA: Lactic Acid, Venous: 1.1 mmol/L (ref 0.5–1.9)

## 2017-05-07 MED ORDER — ADULT MULTIVITAMIN W/MINERALS CH
1.0000 | ORAL_TABLET | Freq: Every day | ORAL | Status: DC
  Administered 2017-05-08 – 2017-05-09 (×2): 1 via ORAL
  Filled 2017-05-07 (×2): qty 1

## 2017-05-07 MED ORDER — DOCUSATE SODIUM 100 MG PO CAPS
100.0000 mg | ORAL_CAPSULE | Freq: Two times a day (BID) | ORAL | Status: DC
  Administered 2017-05-07 – 2017-05-09 (×4): 100 mg via ORAL
  Filled 2017-05-07 (×4): qty 1

## 2017-05-07 MED ORDER — GLUCERNA SHAKE PO LIQD
237.0000 mL | Freq: Three times a day (TID) | ORAL | Status: DC
  Administered 2017-05-07 – 2017-05-09 (×7): 237 mL via ORAL

## 2017-05-07 MED ORDER — INSULIN GLARGINE 100 UNIT/ML ~~LOC~~ SOLN
14.0000 [IU] | Freq: Every day | SUBCUTANEOUS | Status: DC
  Administered 2017-05-07 – 2017-05-08 (×2): 14 [IU] via SUBCUTANEOUS
  Filled 2017-05-07 (×3): qty 0.14

## 2017-05-07 MED ORDER — ACETAMINOPHEN 650 MG RE SUPP: 650.0000 mg | Freq: Four times a day (QID) | RECTAL | Status: DC | PRN

## 2017-05-07 MED ORDER — MULTI-VITAMIN/MINERALS PO TABS: 1.0000 | ORAL_TABLET | Freq: Every day | ORAL | Status: DC

## 2017-05-07 MED ORDER — ONDANSETRON HCL 4 MG PO TABS: 4.0000 mg | ORAL_TABLET | Freq: Four times a day (QID) | ORAL | Status: DC | PRN

## 2017-05-07 MED ORDER — POLYETHYLENE GLYCOL 3350 17 G PO PACK: 17.0000 g | PACK | Freq: Every day | ORAL | Status: DC | PRN

## 2017-05-07 MED ORDER — ONDANSETRON HCL 4 MG/2ML IJ SOLN: 4.0000 mg | Freq: Four times a day (QID) | INTRAMUSCULAR | Status: DC | PRN

## 2017-05-07 MED ORDER — SODIUM CHLORIDE 0.9 % IV SOLN
1.0000 g | Freq: Once | INTRAVENOUS | Status: DC
  Administered 2017-05-07: 1 g via INTRAVENOUS
  Filled 2017-05-07: qty 1

## 2017-05-07 MED ORDER — CALCIUM-VITAMIN D 600-200 MG-UNIT PO TABS: 1.0000 | ORAL_TABLET | Freq: Every day | ORAL | Status: DC

## 2017-05-07 MED ORDER — SODIUM CHLORIDE 0.9 % IV SOLN
INTRAVENOUS | Status: DC
  Administered 2017-05-07 – 2017-05-09 (×5): via INTRAVENOUS

## 2017-05-07 MED ORDER — IBUPROFEN 400 MG PO TABS: 400.0000 mg | ORAL_TABLET | Freq: Four times a day (QID) | ORAL | Status: DC | PRN

## 2017-05-07 MED ORDER — SODIUM CHLORIDE 0.9 % IV SOLN
2.0000 g | INTRAVENOUS | Status: DC
  Filled 2017-05-07: qty 2

## 2017-05-07 MED ORDER — PANTOPRAZOLE SODIUM 40 MG PO TBEC
40.0000 mg | DELAYED_RELEASE_TABLET | Freq: Every day | ORAL | Status: DC
  Administered 2017-05-08 – 2017-05-09 (×2): 40 mg via ORAL
  Filled 2017-05-07 (×2): qty 1

## 2017-05-07 MED ORDER — VANCOMYCIN HCL IN DEXTROSE 1-5 GM/200ML-% IV SOLN: 1000.0000 mg | Freq: Once | INTRAVENOUS | Status: DC

## 2017-05-07 MED ORDER — ACETAMINOPHEN 325 MG PO TABS
650.0000 mg | ORAL_TABLET | Freq: Four times a day (QID) | ORAL | Status: DC | PRN
  Administered 2017-05-07: 650 mg via ORAL
  Filled 2017-05-07: qty 2

## 2017-05-07 MED ORDER — ALBUTEROL SULFATE (2.5 MG/3ML) 0.083% IN NEBU: 2.5000 mg | INHALATION_SOLUTION | Freq: Four times a day (QID) | RESPIRATORY_TRACT | Status: DC | PRN

## 2017-05-07 MED ORDER — METOPROLOL SUCCINATE ER 25 MG PO TB24
25.0000 mg | ORAL_TABLET | Freq: Every day | ORAL | Status: DC
  Administered 2017-05-08 – 2017-05-09 (×2): 25 mg via ORAL
  Filled 2017-05-07 (×2): qty 1

## 2017-05-07 MED ORDER — ENOXAPARIN SODIUM 40 MG/0.4ML ~~LOC~~ SOLN
40.0000 mg | Freq: Every day | SUBCUTANEOUS | Status: DC
  Administered 2017-05-07 – 2017-05-09 (×3): 40 mg via SUBCUTANEOUS
  Filled 2017-05-07 (×3): qty 0.4

## 2017-05-07 MED ORDER — ALBUTEROL SULFATE HFA 108 (90 BASE) MCG/ACT IN AERS: 2.0000 | INHALATION_SPRAY | Freq: Four times a day (QID) | RESPIRATORY_TRACT | Status: DC | PRN

## 2017-05-07 MED ORDER — INSULIN ASPART 100 UNIT/ML ~~LOC~~ SOLN
0.0000 [IU] | Freq: Three times a day (TID) | SUBCUTANEOUS | Status: DC
  Administered 2017-05-07: 5 [IU] via SUBCUTANEOUS
  Administered 2017-05-07: 3 [IU] via SUBCUTANEOUS
  Administered 2017-05-08 (×2): 2 [IU] via SUBCUTANEOUS
  Administered 2017-05-08: 5 [IU] via SUBCUTANEOUS
  Administered 2017-05-09: 2 [IU] via SUBCUTANEOUS
  Administered 2017-05-09: 3 [IU] via SUBCUTANEOUS
  Filled 2017-05-07 (×7): qty 1

## 2017-05-07 MED ORDER — SODIUM CHLORIDE 0.9 % IV SOLN
2.0000 g | Freq: Every day | INTRAVENOUS | Status: DC
  Administered 2017-05-07 – 2017-05-08 (×2): 2 g via INTRAVENOUS
  Filled 2017-05-07 (×3): qty 2

## 2017-05-07 MED ORDER — CALCIUM CARBONATE-VITAMIN D 500-200 MG-UNIT PO TABS
1.0000 | ORAL_TABLET | Freq: Every day | ORAL | Status: DC
  Administered 2017-05-08 – 2017-05-09 (×2): 1 via ORAL
  Filled 2017-05-07 (×2): qty 1

## 2017-05-07 MED ORDER — ACETAMINOPHEN 500 MG PO TABS: 500.0000 mg | ORAL_TABLET | ORAL | Status: DC | PRN

## 2017-05-07 MED ORDER — SODIUM CHLORIDE 0.9 % IV SOLN
2.0000 g | Freq: Three times a day (TID) | INTRAVENOUS | Status: DC
  Filled 2017-05-07 (×2): qty 2

## 2017-05-07 MED ORDER — FINASTERIDE 5 MG PO TABS
5.0000 mg | ORAL_TABLET | Freq: Every day | ORAL | Status: DC
  Administered 2017-05-08 – 2017-05-09 (×2): 5 mg via ORAL
  Filled 2017-05-07 (×2): qty 1

## 2017-05-07 MED ORDER — ATORVASTATIN CALCIUM 20 MG PO TABS
10.0000 mg | ORAL_TABLET | Freq: Every day | ORAL | Status: DC
  Administered 2017-05-08 – 2017-05-09 (×2): 10 mg via ORAL
  Filled 2017-05-07 (×2): qty 1

## 2017-05-07 NOTE — ED Provider Notes (Signed)
[facility] Emergency Department Provider Note   ____________________________________________    I have reviewed the triage vital signs and the nursing notes.   HISTORY  Chief Complaint Fever     HPI Michael [name] is a 84 y.o. male recently diagnosed with metastatic prostate cancer who presents with complaints of fever.  Wife provides majority of history and reports over the weekend the patient had fever up to 101.5.  Discussed with oncologist who recommended taking ciprofloxacin which they have been doing.  Again had a fever this morning.  Patient recently had nephrostomy tube placed on the right and Foley catheter on the left with a left ureteral stent.  No reports of abdominal pain nausea or vomiting.   Past Medical History:  Diagnosis Date  . Benign prostatic hypertrophy   . Bilateral carotid artery disease (HCC)    Mild plaque formation without obstructive disease noted on carotid Doppler.  . Coronary artery disease    Previous cardiac catheterization at [facility] in 2010. The patient was told about 2 blockages which did not require revascularization.  . Diabetes mellitus without complication ([place])   . Essential hypertension   . GERD (gastroesophageal reflux disease)   . Hyperlipidemia   . Hypertension   . Left carotid artery stenosis     Patient Active Problem List   Diagnosis Date Noted  . Goals of care, counseling/discussion 04/21/2017  . Prostate cancer ([place])   . Retroperitoneal lymphadenopathy   . Hydronephrosis of right kidney   . Abdominal pain 04/11/2017  . Type II diabetes mellitus, uncontrolled ([place]) 04/05/2017  . Coronary artery disease involving native coronary artery of native heart without angina pectoris 03/15/2015  . Coronary artery disease involving native coronary artery with angina pectoris ([place]) 01/22/2015  . Essential hypertension   . Hyperlipidemia   . Spermatocele 03/26/2012  . Benign localized prostatic  hyperplasia with lower urinary tract symptoms (LUTS) 03/22/2012  . Candidiasis of urogenital sites 03/22/2012  . ED (erectile dysfunction) of organic origin 03/22/2012  . Elevated prostate specific antigen (PSA) 03/22/2012  . Incomplete emptying of bladder 03/22/2012  . Redundant prepuce and phimosis 03/22/2012  . Urge incontinence 03/22/2012    Past Surgical History:  Procedure Laterality Date  . CARDIAC CATHETERIZATION  2010  . CARDIAC CATHETERIZATION N/A 02/03/2015   Procedure: Left Heart Cath and Coronary Angiography;  Surgeon: Wellington [name], MD;  Location: [place] CV LAB;  Service: Cardiovascular;  Laterality: N/A;  . CATARACT EXTRACTION    . CYSTOSCOPY W/ RETROGRADES Bilateral 04/11/2017   Procedure: CYSTOSCOPY WITH RETROGRADE PYELOGRAM;  Surgeon: [name], MD;  Location: [facility] ORS;  Service: Urology;  Laterality: Bilateral;  . CYSTOSCOPY WITH STENT PLACEMENT Left 04/11/2017   Procedure: CYSTOSCOPY WITH STENT PLACEMENT and fulgeration;  Surgeon: [name], MD;  Location: [facility] ORS;  Service: Urology;  Laterality: Left;  . CYSTOSCOPY WITH URETHRAL DILATATION Bilateral 04/11/2017   Procedure: CYSTOSCOPY WITH URETHRAL DILATATION;  Surgeon: [name], MD;  Location: [facility] ORS;  Service: Urology;  Laterality: Bilateral;  . IR NEPHROSTOMY PLACEMENT RIGHT  04/13/2017  . PROSTATECTOMY      Prior to Admission medications   Medication Sig Start Date End Date Taking? Authorizing Provider  acetaminophen (TYLENOL) 500 MG tablet Take 500 mg by mouth every 4 (four) hours as needed for moderate pain or fever.   Yes [provider]  albuterol (PROVENTIL HFA;VENTOLIN HFA) 108 (90 Base) MCG/ACT inhaler Inhale 2 puffs into the lungs every 6 (six) hours as  needed for wheezing or shortness of breath. 04/16/17  Yes Dustin Flock, MD  atorvastatin (LIPITOR) 10 MG tablet Take 10 mg by mouth daily.   Yes [provider]  Calcium-Vitamin D 600-200 MG-UNIT tablet Take 1  tablet by mouth daily.    Yes [provider]  cetirizine (ZYRTEC) 10 MG tablet Take 1 tablet (10 mg total) by mouth daily. Patient taking differently: Take 10 mg by mouth daily as needed for allergies.  02/07/17  Yes Boscia, Greer Ee, NP  ciprofloxacin (CIPRO) 250 MG tablet Take 250 mg by mouth 2 (two) times daily. 05/05/17 05/12/17 Yes [provider]  feeding supplement, GLUCERNA SHAKE, (GLUCERNA SHAKE) LIQD Take 237 mLs by mouth 3 (three) times daily between meals. 04/20/17  Yes Boscia, Greer Ee, NP  finasteride (PROSCAR) 5 MG tablet Take 5 mg by mouth daily.   Yes [provider]  glucose blood (ACCU-CHEK GUIDE) test strip Use asdirected three times a day diag E11.65 04/19/17  Yes [name], MD  ibuprofen (ADVIL,MOTRIN) 200 MG tablet Take 200 mg by mouth every 4 (four) hours as needed for fever or moderate pain.   Yes [provider]  insulin glargine (LANTUS) 100 UNIT/ML injection Inject 14-16 Units into the skin at bedtime. Based on sugar levels in am.   Yes [provider]  insulin lispro (HUMALOG) 100 UNIT/ML injection Inject 0-10 Units into the skin 3 (three) times daily before meals. Per sliding scale   Yes [provider]  Insulin Syringe-Needle U-100 (B-D INS SYR ULTRAFINE 1CC/31G) 31G X 5/16" 1 ML MISC Use as directed 04/17/17  Yes Boscia, Heather E, NP  lisinopril (PRINIVIL,ZESTRIL) 2.5 MG tablet Take 2.5 mg by mouth daily. 03/14/17  Yes [provider]  metoprolol succinate (TOPROL-XL) 25 MG 24 hr tablet Take 25 mg by mouth daily.  12/22/14  Yes [provider]  Multiple Vitamins-Minerals (MULTIVITAMIN WITH MINERALS) tablet Take 1 tablet by mouth daily.    Yes [provider]  omeprazole (PRILOSEC) 20 MG capsule Take 20 mg by mouth daily. 01/04/15  Yes [provider]  ondansetron (ZOFRAN) 8 MG tablet One pill every 8 hours as needed for nausea/vomitting. Patient taking differently: Take 8 mg by  mouth every 8 (eight) hours as needed for nausea or vomiting.  05/05/17  Yes [name], MD  prochlorperazine (COMPAZINE) 10 MG tablet Take 1 tablet (10 mg total) by mouth every 6 (six) hours as needed for nausea or vomiting. 05/05/17  Yes [name], MD  Artificial Saliva (SALIVASURE) LOZG Use as directed 1 lozenge in the mouth or throat 3 (three) times daily. Patient not taking: Reported on 05/07/2017 04/16/17   Dustin Flock, MD  bicalutamide (CASODEX) 50 MG tablet Take 1 tablet (50 mg total) by mouth daily. Patient not taking: Reported on 05/07/2017 04/17/17   Dustin Flock, MD  dexamethasone (DECADRON) 4 MG tablet 1 tablet AM & PM; Take Day prior and day after chemo. Patient not taking: Reported on 05/07/2017 05/05/17   [name], MD     Allergies Zosyn [piperacillin sod-tazobactam so]  Family History  Problem Relation Age of Onset  . Hypertension Father     Social History Social History   Tobacco Use  . Smoking status: Never Smoker  . Smokeless tobacco: Never Used  Substance Use Topics  . Alcohol use: No  . Drug use: No    Review of Systems  Constitutional: Positive fever as above Eyes: No visual changes.  ENT:  No sore throat. Cardiovascular: Denies chest pain. Respiratory: Denies shortness of breath. Gastrointestinal: No nausea or vomiting Genitourinary: Foley catheter Musculoskeletal: Negative for back pain. Skin: Negative for rash. Neurological: Negative for headaches    ____________________________________________   PHYSICAL EXAM:  VITAL SIGNS: ED Triage Vitals  Enc Vitals Group     BP 05/07/17 0735 135/72     Pulse Rate 05/07/17 0735 (!) 124     Resp 05/07/17 0735 20     Temp 05/07/17 0735 98.2 F (36.8 C)     Temp Source 05/07/17 0735 Oral     SpO2 05/07/17 0735 98 %     Weight 05/07/17 0736 57.6 kg (127 lb)     Height 05/07/17 0736 1.676 m (5\' 6" )     Head Circumference --      Peak Flow --      Pain Score  05/07/17 0736 0     Pain Loc --      Pain Edu? --      Excl. in [place]? --     Constitutional: Alert and oriented.  Eyes: Conjunctivae are normal.   Nose: No congestion/rhinnorhea. Mouth/Throat: Mucous membranes are moist.    Cardiovascular: Normal rate, regular rhythm. Grossly normal heart sounds.  Good peripheral circulation. Respiratory: Normal respiratory effort.  No retractions. Lungs CTAB. Gastrointestinal: Soft and nontender. No distention.  No CVA tenderness. Genitourinary: Right nephrostomy tube in place, Foley catheter yellow urine, cloudy Musculoskeletal: .  Warm and well perfused Neurologic:  Normal speech and language. No gross focal neurologic deficits are appreciated.  Skin:  Skin is warm, dry and intact. No rash noted. Psychiatric: Mood and affect are normal. Speech and behavior are normal.  ____________________________________________   LABS (all labs ordered are listed, but only abnormal results are displayed)  Labs Reviewed  COMPREHENSIVE METABOLIC PANEL - Abnormal; Notable for the following components:      Result Value   Sodium 124 (*)    Chloride 95 (*)    CO2 20 (*)    Glucose, Bld 274 (*)    BUN 22 (*)    Calcium 7.6 (*)    Albumin 3.0 (*)    Alkaline Phosphatase 522 (*)    All other components within normal limits  CBC WITH DIFFERENTIAL/PLATELET - Abnormal; Notable for the following components:   RBC 3.78 (*)    Hemoglobin 10.4 (*)    HCT 31.7 (*)    RDW 16.4 (*)    All other components within normal limits  URINALYSIS, COMPLETE (UACMP) WITH MICROSCOPIC - Abnormal; Notable for the following components:   Color, Urine YELLOW (*)    APPearance CLOUDY (*)    Glucose, UA >=500 (*)    Hgb urine dipstick MODERATE (*)    Protein, ur 100 (*)    Nitrite POSITIVE (*)    Leukocytes, UA LARGE (*)    Bacteria, UA MANY (*)    All other components within normal limits  URINALYSIS, COMPLETE (UACMP) WITH MICROSCOPIC - Abnormal; Notable for the following  components:   Color, Urine YELLOW (*)    APPearance TURBID (*)    Glucose, UA 150 (*)    Hgb urine dipstick MODERATE (*)    Protein, ur 30 (*)    Nitrite POSITIVE (*)    Leukocytes, UA LARGE (*)    Bacteria, UA MANY (*)    All other components within normal limits  URINE CULTURE  URINE CULTURE  CULTURE, BLOOD (ROUTINE X 2)  CULTURE, BLOOD (ROUTINE X 2)  LACTIC ACID, PLASMA   ____________________________________________  EKG  None ____________________________________________  RADIOLOGY  Chest x-ray unremarkable ____________________________________________   PROCEDURES  Procedure(s) performed: No  Procedures   Critical Care performed: No ____________________________________________   INITIAL IMPRESSION / ASSESSMENT AND PLAN / ED COURSE  Pertinent labs & imaging results that were available during my care of the patient were reviewed by me and considered in my medical decision making (see chart for details).  Patient presents with fever over the weekend, he has a nephrostomy on the right, left ureteral stent and Foley catheter on the left hence two urine samples were sent to the lab.  As strong concern for infection.  Lab work is concerning for significant hyponatremia, lactic acid is normal.  White blood cell count is normal.  Both urines have returned consistent with infection, patient is being treated with vancomycin and aztreonam given Zosyn/possible penicillin allergy    ____________________________________________   FINAL CLINICAL IMPRESSION(S) / ED DIAGNOSES  Final diagnoses:  Hyponatremia  Infection associated with nephrostomy catheter, initial encounter ([place])  Urinary tract infection associated with indwelling urethral catheter, initial encounter [facility])        Note:  This document was prepared using Dragon voice recognition software and may include unintentional dictation errors.    Lavonia Drafts, MD 05/07/17 [phone]

## 2017-05-07 NOTE — ED Triage Notes (Signed)
Fever x 3 days. Has had nephrostomy since feb. Started cipro by PMD 2 days ago. Took ibuprofen at 0430 am.

## 2017-05-07 NOTE — Progress Notes (Signed)
[place] responded to an OR for an AD. Chaplain educated wife about and LW. Pt speaks a little [place]. Primary lanuague is [place]. Pt is going to the floor. Family will notify when needed.    05/07/17 1100  Clinical Encounter Type  Visited With Patient and family together  Visit Type Initial  Referral From Nurse  Spiritual Encounters  Spiritual Needs Brochure

## 2017-05-07 NOTE — Progress Notes (Signed)
[facility]  Patient Status: Active  [facility] is providing the following services: PT  If patient discharges after hours, please call [phone].   Michael [name] 05/07/2017, 10:51 AM

## 2017-05-07 NOTE — Care Management (Signed)
Notified by Corene Cornea with [facility] that patient is active with [facility] for HHPT

## 2017-05-07 NOTE — H&P (Signed)
[name] [facility] [place] NAME: Michael [name]    MR#:  [id number]  DATE OF BIRTH:  09-22-1933  DATE OF ADMISSION:  05/07/2017  PRIMARY CARE PHYSICIAN: [name], MD   REQUESTING/REFERRING PHYSICIAN: Dr. Corky Downs  CHIEF COMPLAINT:   I have fever for 3 days HISTORY OF PRESENT ILLNESS:  Michael [name]  is a 84 y.o. male with a known history of recent diagnosis of metastatic prostate cancer status post Foley catheter chronic, left ureteral stent placement and percutaneous nephrostomy tube placement in February 2019 when patient presented with urinary retention and workup thereafter showed patient has metastatic prostate cancer currently undergoing chemotherapy at the cancer center. Comes in with generalized weakness and fever for 3 days.  Patient had fever of 101.4 today at home.  Came to the emergency room was found to have severe UTI with urine both from Foley and nephrostomy tube. Give vancomycin and IV aztreonam.  Patient is being admitted with acute febrile illness acute cystitis in the setting of chronic nephrostomy and Foley catheter placement.  PAST MEDICAL HISTORY:   Past Medical History:  Diagnosis Date  . Benign prostatic hypertrophy   . Bilateral carotid artery disease (HCC)    Mild plaque formation without obstructive disease noted on carotid Doppler.  . Coronary artery disease    Previous cardiac catheterization at [facility] in 2010. The patient was told about 2 blockages which did not require revascularization.  . Diabetes mellitus without complication ([place])   . Essential hypertension   . GERD (gastroesophageal reflux disease)   . Hyperlipidemia   . Hypertension   . Left carotid artery stenosis     PAST SURGICAL HISTOIRY:   Past Surgical History:  Procedure Laterality Date  . CARDIAC CATHETERIZATION  2010  . CARDIAC CATHETERIZATION N/A 02/03/2015   Procedure: Left Heart Cath and Coronary Angiography;  Surgeon:  Wellington [name], MD;  Location: [place] CV LAB;  Service: Cardiovascular;  Laterality: N/A;  . CATARACT EXTRACTION    . CYSTOSCOPY W/ RETROGRADES Bilateral 04/11/2017   Procedure: CYSTOSCOPY WITH RETROGRADE PYELOGRAM;  Surgeon: [name], MD;  Location: [facility] ORS;  Service: Urology;  Laterality: Bilateral;  . CYSTOSCOPY WITH STENT PLACEMENT Left 04/11/2017   Procedure: CYSTOSCOPY WITH STENT PLACEMENT and fulgeration;  Surgeon: [name], MD;  Location: [facility] ORS;  Service: Urology;  Laterality: Left;  . CYSTOSCOPY WITH URETHRAL DILATATION Bilateral 04/11/2017   Procedure: CYSTOSCOPY WITH URETHRAL DILATATION;  Surgeon: [name], MD;  Location: [facility] ORS;  Service: Urology;  Laterality: Bilateral;  . IR NEPHROSTOMY PLACEMENT RIGHT  04/13/2017  . PROSTATECTOMY      SOCIAL HISTORY:   Social History   Tobacco Use  . Smoking status: Never Smoker  . Smokeless tobacco: Never Used  Substance Use Topics  . Alcohol use: No    FAMILY HISTORY:   Family History  Problem Relation Age of Onset  . Hypertension Father     DRUG ALLERGIES:   Allergies  Allergen Reactions  . Zosyn [Piperacillin Sod-Tazobactam So] Swelling    Lips swollen.  Was given at hospital through IV    REVIEW OF SYSTEMS:  Review of Systems  Constitutional: Negative for chills, fever and weight loss.  HENT: Negative for ear discharge, ear pain and nosebleeds.   Eyes: Negative for blurred vision, pain and discharge.  Respiratory: Negative for sputum production, shortness of breath, wheezing and stridor.   Cardiovascular: Negative for chest pain, palpitations, orthopnea and PND.  Gastrointestinal: Negative  for abdominal pain, diarrhea, nausea and vomiting.  Genitourinary: Positive for dysuria. Negative for frequency and urgency.  Musculoskeletal: Negative for back pain and joint pain.  Neurological: Positive for weakness. Negative for sensory change, speech change and focal weakness.   Psychiatric/Behavioral: Negative for depression and hallucinations. The patient is not nervous/anxious.      MEDICATIONS AT HOME:   Prior to Admission medications   Medication Sig Start Date End Date Taking? Authorizing Provider  acetaminophen (TYLENOL) 500 MG tablet Take 500 mg by mouth every 4 (four) hours as needed for moderate pain or fever.   Yes [provider]  albuterol (PROVENTIL HFA;VENTOLIN HFA) 108 (90 Base) MCG/ACT inhaler Inhale 2 puffs into the lungs every 6 (six) hours as needed for wheezing or shortness of breath. 04/16/17  Yes Dustin Flock, MD  atorvastatin (LIPITOR) 10 MG tablet Take 10 mg by mouth daily.   Yes [provider]  Calcium-Vitamin D 600-200 MG-UNIT tablet Take 1 tablet by mouth daily.    Yes [provider]  cetirizine (ZYRTEC) 10 MG tablet Take 1 tablet (10 mg total) by mouth daily. Patient taking differently: Take 10 mg by mouth daily as needed for allergies.  02/07/17  Yes Boscia, Greer Ee, NP  feeding supplement, GLUCERNA SHAKE, (GLUCERNA SHAKE) LIQD Take 237 mLs by mouth 3 (three) times daily between meals. 04/20/17  Yes Boscia, Greer Ee, NP  finasteride (PROSCAR) 5 MG tablet Take 5 mg by mouth daily.   Yes [provider]  glucose blood (ACCU-CHEK GUIDE) test strip Use asdirected three times a day diag E11.65 04/19/17  Yes [name], MD  ibuprofen (ADVIL,MOTRIN) 200 MG tablet Take 200 mg by mouth every 4 (four) hours as needed for fever or moderate pain.   Yes [provider]  insulin glargine (LANTUS) 100 UNIT/ML injection Inject 14-16 Units into the skin at bedtime. Based on sugar levels in am.   Yes [provider]  insulin lispro (HUMALOG) 100 UNIT/ML injection Inject 0-10 Units into the skin 3 (three) times daily before meals. Per sliding scale   Yes [provider]  Insulin Syringe-Needle U-100 (B-D INS SYR ULTRAFINE 1CC/31G) 31G X 5/16" 1 ML MISC Use as directed 04/17/17  Yes Boscia,  Heather E, NP  lisinopril (PRINIVIL,ZESTRIL) 2.5 MG tablet Take 2.5 mg by mouth daily. 03/14/17  Yes [provider]  metoprolol succinate (TOPROL-XL) 25 MG 24 hr tablet Take 25 mg by mouth daily.  12/22/14  Yes [provider]  Multiple Vitamins-Minerals (MULTIVITAMIN WITH MINERALS) tablet Take 1 tablet by mouth daily.    Yes [provider]  omeprazole (PRILOSEC) 20 MG capsule Take 20 mg by mouth daily. 01/04/15  Yes [provider]  ondansetron (ZOFRAN) 8 MG tablet One pill every 8 hours as needed for nausea/vomitting. Patient taking differently: Take 8 mg by mouth every 8 (eight) hours as needed for nausea or vomiting.  05/05/17  Yes [name], MD  prochlorperazine (COMPAZINE) 10 MG tablet Take 1 tablet (10 mg total) by mouth every 6 (six) hours as needed for nausea or vomiting. 05/05/17  Yes [name], MD      VITAL SIGNS:  Blood pressure (!) 143/68, pulse 97, temperature 99.2 F (37.3 C), temperature source Oral, resp. rate (!) 24, height 5\' 6"  (1.676 m), weight 57.6 kg (127 lb), SpO2 99 %.  PHYSICAL EXAMINATION:  GENERAL:  84 y.o.-year-old patient lying in the bed with no acute distress.  EYES: Pupils equal, round, reactive  to light and accommodation. No scleral icterus. Extraocular muscles intact.  HEENT: Head atraumatic, normocephalic. Oropharynx and nasopharynx clear.  NECK:  Supple, no jugular venous distention. No thyroid enlargement, no tenderness.  LUNGS: Normal breath sounds bilaterally, no wheezing, rales,rhonchi or crepitation. No use of accessory muscles of respiration.  CARDIOVASCULAR: S1, S2 normal. No murmurs, rubs, or gallops.  ABDOMEN: Soft, nontender, nondistended. Bowel sounds present. No organomegaly or mass.  Right nephrostomy tube, chronic Foley catheter + EXTREMITIES: No pedal edema, cyanosis, or clubbing.  NEUROLOGIC: Cranial nerves II through XII are intact. Muscle strength 5/5 in all extremities. Sensation  intact. Gait not checked.  PSYCHIATRIC: The patient is alert and oriented x 3.  SKIN: No obvious rash, lesion, or ulcer.   LABORATORY PANEL:   CBC Recent Labs  Lab 05/07/17 0740  WBC 7.5  HGB 10.4*  HCT 31.7*  PLT 255   ------------------------------------------------------------------------------------------------------------------  Chemistries  Recent Labs  Lab 05/07/17 0740  NA 124*  K 4.0  CL 95*  CO2 20*  GLUCOSE 274*  BUN 22*  CREATININE 1.00  CALCIUM 7.6*  AST 30  ALT 29  ALKPHOS 522*  BILITOT 0.5   ------------------------------------------------------------------------------------------------------------------  Cardiac Enzymes No results for input(s): TROPONINI in the last 168 hours. ------------------------------------------------------------------------------------------------------------------  RADIOLOGY:  Dg Chest 2 View  Result Date: 05/07/2017 CLINICAL DATA:  Cough.  Prostate cancer. EXAM: CHEST - 2 VIEW COMPARISON:  04/14/2017 FINDINGS: COPD with hyperinflation of the lungs. Negative for infiltrate effusion or mass. No lung nodule or adenopathy in the chest Right nephrostomy.  Left ureteral stent. No skeletal lesions identified IMPRESSION: COPD without acute abnormality. Electronically Signed   By: Franchot Gallo M.D.   On: 05/07/2017 08:33    EKG:    IMPRESSION AND PLAN:   Adit Riddles  is a 84 y.o. male with a known history of recent diagnosis of metastatic prostate cancer status post Foley catheter chronic, left ureteral stent placement and percutaneous nephrostomy tube placement in February 2019 when patient presented with urinary retention and workup thereafter showed patient has metastatic prostate cancer currently undergoing chemotherapy at the cancer center.  1.  Acute febrile illness with severe UTI in the setting of chronic Foley and nephrostomy tube -IV cefepime 2 g every 8 -Spoke with patient's daughter Darvin Dials regarding his  allergy to Zosyn.  She mentioned patient had very minimal swelling of the lips when he had presented 1 of the times to the emergency room when he received IV Zosyn during that ER visit.  Thereafter patient has not had any other issues.  Discussed with daughter that we will start patient on cefepime for UTI present and continue monitoring if he has any side effects to it.  She voiced understanding and is agreeable  2.  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 Rayburn Ma who will swing by to see patient.  Nothing acute needs to be done regarding the nephrostomy of Foley catheter present.  Patient has a left ureteral stent which needs to be taken care of at a later date per Dr. Rayburn Ma  3.  Type 2 diabetes continue sliding scale and insulin  4.  Hypertension -Pressure is a little bit softer will hold off meds resume when appropriate  5.  DVT prophylaxis subcu Lovenox  All the records are reviewed and case discussed with ED provider. Management  plans discussed with the patient, family and they are in agreement.  CODE STATUS: Full  TOTAL TIME TAKING CARE OF THIS PATIENT: 50 minutes.   Was discussed with patient and patient's daughter Joushua Dugar M.D on 05/07/2017 at 11:41 AM  Between 7am to 6pm - Pager - [phone]  After 6pm go to [email or web address] - password [email or web address] [facility]  [facility]  Office  [phone]  CC: Primary care physician; [name], MD

## 2017-05-07 NOTE — ED Notes (Signed)
Urine resulted at 09:12  Was from patient's catheter and urine resulted at 09:25 came from right nephrostomy bag.

## 2017-05-07 NOTE — Progress Notes (Signed)
Family Meeting Note  Advance Directive: No  Today a meeting took place with the patient and daughter  The following were discussed:Patient's diagnosis: , Patient's progosis: Patient recently diagnosed with metastatic prostate cancer and currently has nephrostomy tube with chronic Foley catheter.  Comes in with febrile illness found to have severe UTI is being admitted for IV antibiotics and IV fluids.  CODE STATUS addressed.  Patient wishes to be full code.  Daughter was in the room who did voiced understanding. Additional follow-up to be provided: spiritual care  Time spent during discussion:16 mins Fritzi Mandes, MD

## 2017-05-07 NOTE — Progress Notes (Signed)
Pharmacy Antibiotic Note  Michael [name] is a 84 y.o. male admitted on 05/07/2017 with UTI.  Patient has history significant for metastatic prostate cancer. Pharmacy has been consulted for cefepime dosing. Patient received aztreonam 1g IV x 1 in ED. Patient received cefazolin prior to surgery 02/19. Patient with right nephrostomy tube and foley catheter with left ureteral stent. Patient ordered ciprofloxacin as an outpatient and presents to the emergency department with persistent fevers.    Plan: Will continue renally adjusted cefepime dose of 2g IV Q24hr.   Height: 5\' 6"  (167.6 cm) Weight: 127 lb (57.6 kg) IBW/kg (Calculated) : 63.8  Temp (24hrs), Avg:98.2 F (36.8 C), Min:98.2 F (36.8 C), Max:98.2 F (36.8 C)  Recent Labs  Lab 05/04/17 0938 05/07/17 0740  WBC 7.6 7.5  CREATININE 0.82 1.00  LATICACIDVEN  --  1.1    Estimated Creatinine Clearance: 47.2 mL/min (by C-G formula based on SCr of 1 mg/dL).    Allergies  Allergen Reactions  . Zosyn [Piperacillin Sod-Tazobactam So] Swelling    Lips swollen.  Was given at hospital through IV    Antimicrobials this admission: Aztreonam 1g IV x 1  Cefepime 3/18 >>   Dose adjustments this admission: 3/18 Cefepime 2g IV Q8hr transitioned to cefepime 2g IV Q24hr for indication and renal function   Microbiology results: 3/18 BCx: pending 3/18 UCx: pending 3/18 UCx; right kidney: pending   Thank you for allowing pharmacy to be a part of this patient's care.  Yudith Norlander L 05/07/2017 11:34 AM

## 2017-05-07 NOTE — Telephone Encounter (Signed)
Cancelled clinic appt on 05/08/2017 & scheduled appt for Foley catheter exchange & 4 week f/u appt to discuss bilateral stent exchange. Will notify daughter of appt with IR to convert right nephrostomy tube to internal double-J stent once it has been scheduled. Daughter voices understanding.

## 2017-05-07 NOTE — ED Notes (Signed)
Patient transported to X-ray 

## 2017-05-07 NOTE — Telephone Encounter (Signed)
Amy, please cancel patient's clinic visit for tomorrow morning.  I would like to go ahead and schedule conversion of right-sided nephrostomy tube to internal double-J for next week.  At also like to see him on Friday morning, double book 830 appointment for a Foley catheter exchange over a wire.  I discussed this with the patient and his daughter.  They are agreeable with the plan.  He will need to see me in clinic in about 4 weeks to discuss bilateral tube exchange.  [name], MD

## 2017-05-07 NOTE — Progress Notes (Signed)
Stop by to see patient as a courtesy visit.  He is scheduled to see me tomorrow in clinic to arrange for internalization of his right-sided PCN as well as Foley catheter exchange.  He was admitted with UTI, positive UA and fevers to 1015.  He has been started on broad-spectrum antibiotics.  Cultures are pending.  In light of current infection, we will cancel office visit tomorrow, arrange for internalization of right-sided PCN likely next week and reschedule his Foley catheter exchange over wire for likely Friday after he is been on antibiotics for several days.  The patient and the daughter are agreeable with this plan.  [name], MD

## 2017-05-07 NOTE — Plan of Care (Signed)
Pt admitted from the ED. Afebrile. VSS. Pt reported some lower abdominal pain, but declined further action.

## 2017-05-08 ENCOUNTER — Ambulatory Visit: Payer: Medicare Other | Admitting: Urology

## 2017-05-08 LAB — BASIC METABOLIC PANEL
Anion gap: 7 (ref 5–15)
BUN: 18 mg/dL (ref 6–20)
CO2: 25 mmol/L (ref 22–32)
Calcium: 7.8 mg/dL — ABNORMAL LOW (ref 8.9–10.3)
Chloride: 104 mmol/L (ref 101–111)
Creatinine, Ser: 0.94 mg/dL (ref 0.61–1.24)
GFR calc Af Amer: >60 mL/min (ref >60)
GFR calc non Af Amer: >60 mL/min (ref >60)
Glucose, Bld: 135 mg/dL — ABNORMAL HIGH (ref 65–99)
Potassium: 4.1 mmol/L (ref 3.5–5.1)
Sodium: 136 mmol/L (ref 135–145)

## 2017-05-08 LAB — GLUCOSE, CAPILLARY
Glucose-Capillary: 164 mg/dL — ABNORMAL HIGH (ref 65–99)
Glucose-Capillary: 267 mg/dL — ABNORMAL HIGH (ref 65–99)
Glucose-Capillary: 177 mg/dL — ABNORMAL HIGH (ref 65–99)
Glucose-Capillary: 289 mg/dL — ABNORMAL HIGH (ref 65–99)

## 2017-05-08 NOTE — Progress Notes (Signed)
[facility] [place] NAME: Asaph Serena    MR#:  [id number]  DATE OF BIRTH:  1933/08/06  SUBJECTIVE:  CHIEF COMPLAINT:   Chief Complaint  Patient presents with  . Fever  febrile up to 100.2 last night, feels somewhat better, daughter at bedside REVIEW OF SYSTEMS:  Review of Systems  Constitutional: Positive for fever. Negative for chills and weight loss.  HENT: Negative for nosebleeds and sore throat.   Eyes: Negative for blurred vision.  Respiratory: Negative for cough, shortness of breath and wheezing.   Cardiovascular: Negative for chest pain, orthopnea, leg swelling and PND.  Gastrointestinal: Negative for abdominal pain, constipation, diarrhea, heartburn, nausea and vomiting.  Genitourinary: Negative for dysuria and urgency.  Musculoskeletal: Negative for back pain.  Skin: Negative for rash.  Neurological: Negative for dizziness, speech change, focal weakness and headaches.  Endo/Heme/Allergies: Does not bruise/bleed easily.  Psychiatric/Behavioral: Negative for depression.   DRUG ALLERGIES:   Allergies  Allergen Reactions  . Zosyn [Piperacillin Sod-Tazobactam So] Swelling    Lips swollen.  Was given at hospital through IV   VITALS:  Blood pressure 140/66, pulse 97, temperature 98.3 F (36.8 C), temperature source Oral, resp. rate 18, height 5\' 6"  (1.676 m), weight 57.6 kg (127 lb), SpO2 98 %. PHYSICAL EXAMINATION:  Physical Exam  Constitutional: He is oriented to person, place, and time and well-developed, well-nourished, and in no distress.  HENT:  Head: Normocephalic and atraumatic.  Eyes: Conjunctivae and EOM are normal. Pupils are equal, round, and reactive to light.  Neck: Normal range of motion. Neck supple. No tracheal deviation present. No thyromegaly present.  Cardiovascular: Normal rate, regular rhythm and normal heart sounds.  Pulmonary/Chest: Effort normal and breath sounds normal. No respiratory  distress. He has no wheezes. He exhibits no tenderness.  Abdominal: Soft. Bowel sounds are normal. He exhibits no distension. There is no tenderness.  Musculoskeletal: Normal range of motion.  Neurological: He is alert and oriented to person, place, and time. No cranial nerve deficit.  Skin: Skin is warm and dry. No rash noted.  Psychiatric: Mood and affect normal.   LABORATORY PANEL:  Male CBC Recent Labs  Lab 05/07/17 0740  WBC 7.5  HGB 10.4*  HCT 31.7*  PLT 255   ------------------------------------------------------------------------------------------------------------------ Chemistries  Recent Labs  Lab 05/07/17 0740 05/08/17 0542  NA 124* 136  K 4.0 4.1  CL 95* 104  CO2 20* 25  GLUCOSE 274* 135*  BUN 22* 18  CREATININE 1.00 0.94  CALCIUM 7.6* 7.8*  AST 30  --   ALT 29  --   ALKPHOS 522*  --   BILITOT 0.5  --    RADIOLOGY:  No results found. ASSESSMENT AND PLAN:  Nichols Corter  is a 84 y.o. male with a known history of recent diagnosis of metastatic prostate cancer status post Foley catheter chronic, left ureteral stent placement and percutaneous nephrostomy tube placement in February 2019 when patient presented with urinary retention and workup thereafter showed patient has metastatic prostate cancer currently undergoing chemotherapy at the cancer center.  1.  Acute febrile illness with severe UTI in the setting of chronic Foley and nephrostomy tube -continue IV cefepime 2 g every 8  2.  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 Nonah Mattes - her office is arranging for  internalization of right-sided PCN likely next week and reschedule his Foley catheter exchange over wire for likely Friday after he is been on antibiotics for several days.  3.  Type 2  diabetes continue sliding scale  - continue insulin lantus 14 units daily  4.  Hypertension - controlled on metoprolol       All the records are reviewed and case discussed with Care Management/Social Worker. Management plans discussed with the patient, family (Spoke with patient's daughter Clements Toro at bedside) & Dr Erlene Quan and they are in agreement.  CODE STATUS: Full Code  TOTAL TIME TAKING CARE OF THIS PATIENT: 25 minutes.   More than 50% of the time was spent in counseling/coordination of care: YES  POSSIBLE D/C IN 1-2 DAYS, DEPENDING ON CLINICAL CONDITION.   Max Sane M.D on 05/08/2017 at 10:05 AM  Between 7am to 6pm - Pager - [phone]  After 6pm go to [email or web address] - password [email or web address] [facility]  [facility]  Office  [phone]  CC: Primary care physician; [name], MD  Note: This dictation was prepared with Dragon dictation along with smaller phrase technology. Any transcriptional errors that result from this process are unintentional.

## 2017-05-09 ENCOUNTER — Telehealth: Payer: Self-pay | Admitting: Internal Medicine

## 2017-05-09 DIAGNOSIS — E43 Unspecified severe protein-calorie malnutrition: Secondary | ICD-10-CM

## 2017-05-09 LAB — BASIC METABOLIC PANEL
Anion gap: 7 (ref 5–15)
BUN: 16 mg/dL (ref 6–20)
CO2: 23 mmol/L (ref 22–32)
Calcium: 7.7 mg/dL — ABNORMAL LOW (ref 8.9–10.3)
Chloride: 104 mmol/L (ref 101–111)
Creatinine, Ser: 0.73 mg/dL (ref 0.61–1.24)
GFR calc Af Amer: >60 mL/min (ref >60)
GFR calc non Af Amer: >60 mL/min (ref >60)
Glucose, Bld: 163 mg/dL — ABNORMAL HIGH (ref 65–99)
Potassium: 4.4 mmol/L (ref 3.5–5.1)
Sodium: 134 mmol/L — ABNORMAL LOW (ref 135–145)

## 2017-05-09 LAB — URINE CULTURE: Culture: >=100000 — AB

## 2017-05-09 LAB — CBC
HCT: 29 % — ABNORMAL LOW (ref 40.0–52.0)
Hemoglobin: 9.6 g/dL — ABNORMAL LOW (ref 13.0–18.0)
MCH: 27.8 pg (ref 26.0–34.0)
MCHC: 33.3 g/dL (ref 32.0–36.0)
MCV: 83.5 fL (ref 80.0–100.0)
Platelets: 276 10*3/uL (ref 150–440)
RBC: 3.47 MIL/uL — ABNORMAL LOW (ref 4.40–5.90)
RDW: 16.6 % — ABNORMAL HIGH (ref 11.5–14.5)
WBC: 7.2 10*3/uL (ref 3.8–10.6)

## 2017-05-09 LAB — GLUCOSE, CAPILLARY
Glucose-Capillary: 163 mg/dL — ABNORMAL HIGH (ref 65–99)
Glucose-Capillary: 247 mg/dL — ABNORMAL HIGH (ref 65–99)

## 2017-05-09 MED ORDER — NITROFURANTOIN MONOHYD MACRO 100 MG PO CAPS: 100.0000 mg | ORAL_CAPSULE | Freq: Two times a day (BID) | ORAL | 0 refills | Status: DC

## 2017-05-09 MED ORDER — NITROFURANTOIN MONOHYD MACRO 100 MG PO CAPS
100.0000 mg | ORAL_CAPSULE | Freq: Two times a day (BID) | ORAL | Status: DC
  Administered 2017-05-09: 100 mg via ORAL
  Filled 2017-05-09 (×2): qty 1

## 2017-05-09 MED ORDER — CALCIUM CARBONATE-VITAMIN D 500-200 MG-UNIT PO TABS: 1.0000 | ORAL_TABLET | Freq: Every day | ORAL | 1 refills | Status: DC

## 2017-05-09 NOTE — Progress Notes (Signed)
x

## 2017-05-09 NOTE — Patient Instructions (Addendum)
Docetaxel injection What is this medicine? DOCETAXEL (doe se TAX el) is a chemotherapy drug. It targets fast dividing cells, like cancer cells, and causes these cells to die. This medicine is used to treat many types of cancers like breast cancer, certain stomach cancers, head and neck cancer, lung cancer, and prostate cancer. This medicine may be used for other purposes; ask your health care provider or pharmacist if you have questions. COMMON BRAND NAME(S): Docefrez, Taxotere What should I tell my health care provider before I take this medicine? They need to know if you have any of these conditions: -infection (especially a virus infection such as chickenpox, cold sores, or herpes) -liver disease -low blood counts, like low white cell, platelet, or red cell counts -an unusual or allergic reaction to docetaxel, polysorbate 80, other chemotherapy agents, other medicines, foods, dyes, or preservatives -pregnant or trying to get pregnant -breast-feeding How should I use this medicine? This drug is given as an infusion into a vein. It is administered in a hospital or clinic by a specially trained health care professional. Talk to your pediatrician regarding the use of this medicine in children. Special care may be needed. Overdosage: If you think you have taken too much of this medicine contact a poison control center or emergency room at once. NOTE: This medicine is only for you. Do not share this medicine with others. What if I miss a dose? It is important not to miss your dose. Call your doctor or health care professional if you are unable to keep an appointment. What may interact with this medicine? -cyclosporine -erythromycin -ketoconazole -medicines to increase blood counts like filgrastim, pegfilgrastim, sargramostim -vaccines Talk to your doctor or health care professional before taking any of these medicines: -acetaminophen -aspirin -ibuprofen -ketoprofen -naproxen This list  may not describe all possible interactions. Give your health care provider a list of all the medicines, herbs, non-prescription drugs, or dietary supplements you use. Also tell them if you smoke, drink alcohol, or use illegal drugs. Some items may interact with your medicine. What should I watch for while using this medicine? Your condition will be monitored carefully while you are receiving this medicine. You will need important blood work done while you are taking this medicine. This drug may make you feel generally unwell. This is not uncommon, as chemotherapy can affect healthy cells as well as cancer cells. Report any side effects. Continue your course of treatment even though you feel ill unless your doctor tells you to stop. In some cases, you may be given additional medicines to help with side effects. Follow all directions for their use. Call your doctor or health care professional for advice if you get a fever, chills or sore throat, or other symptoms of a cold or flu. Do not treat yourself. This drug decreases your body's ability to fight infections. Try to avoid being around people who are sick. This medicine may increase your risk to bruise or bleed. Call your doctor or health care professional if you notice any unusual bleeding. This medicine may contain alcohol in the product. You may get drowsy or dizzy. Do not drive, use machinery, or do anything that needs mental alertness until you know how this medicine affects you. Do not stand or sit up quickly, especially if you are an older patient. This reduces the risk of dizzy or fainting spells. Avoid alcoholic drinks. Do not become pregnant while taking this medicine. Women should inform their doctor if they wish to become pregnant or   think they might be pregnant. There is a potential for serious side effects to an unborn child. Talk to your health care professional or pharmacist for more information. Do not breast-feed an infant while taking  this medicine. What side effects may I notice from receiving this medicine? Side effects that you should report to your doctor or health care professional as soon as possible: -allergic reactions like skin rash, itching or hives, swelling of the face, lips, or tongue -low blood counts - This drug may decrease the number of white blood cells, red blood cells and platelets. You may be at increased risk for infections and bleeding. -signs of infection - fever or chills, cough, sore throat, pain or difficulty passing urine -signs of decreased platelets or bleeding - bruising, pinpoint red spots on the skin, black, tarry stools, nosebleeds -signs of decreased red blood cells - unusually weak or tired, fainting spells, lightheadedness -breathing problems -fast or irregular heartbeat -low blood pressure -mouth sores -nausea and vomiting -pain, swelling, redness or irritation at the injection site -pain, tingling, numbness in the hands or feet -swelling of the ankle, feet, hands -weight gain Side effects that usually do not require medical attention (report to your doctor or health care professional if they continue or are bothersome): -bone pain -complete hair loss including hair on your head, underarms, pubic hair, eyebrows, and eyelashes -diarrhea -excessive tearing -changes in the color of fingernails -loosening of the fingernails -nausea -muscle pain -red flush to skin -sweating -weak or tired This list may not describe all possible side effects. Call your doctor for medical advice about side effects. You may report side effects to FDA at 1-800-FDA-1088. Where should I keep my medicine? This drug is given in a hospital or clinic and will not be stored at home. NOTE: This sheet is a summary. It may not cover all possible information. If you have questions about this medicine, talk to your doctor, pharmacist, or health care provider.  2018 Elsevier/Gold Standard (2015-03-11  12:32:56) Docetaxel injection What is this medicine? DOCETAXEL (doe se TAX el) is a chemotherapy drug. It targets fast dividing cells, like cancer cells, and causes these cells to die. This medicine is used to treat many types of cancers like breast cancer, certain stomach cancers, head and neck cancer, lung cancer, and prostate cancer. This medicine may be used for other purposes; ask your health care provider or pharmacist if you have questions. COMMON BRAND NAME(S): Docefrez, Taxotere What should I tell my health care provider before I take this medicine? They need to know if you have any of these conditions: -infection (especially a virus infection such as chickenpox, cold sores, or herpes) -liver disease -low blood counts, like low white cell, platelet, or red cell counts -an unusual or allergic reaction to docetaxel, polysorbate 80, other chemotherapy agents, other medicines, foods, dyes, or preservatives -pregnant or trying to get pregnant -breast-feeding How should I use this medicine? This drug is given as an infusion into a vein. It is administered in a hospital or clinic by a specially trained health care professional. Talk to your pediatrician regarding the use of this medicine in children. Special care may be needed. Overdosage: If you think you have taken too much of this medicine contact a poison control center or emergency room at once. NOTE: This medicine is only for you. Do not share this medicine with others. What if I miss a dose? It is important not to miss your dose. Call your doctor or health care  professional if you are unable to keep an appointment. What may interact with this medicine? -cyclosporine -erythromycin -ketoconazole -medicines to increase blood counts like filgrastim, pegfilgrastim, sargramostim -vaccines Talk to your doctor or health care professional before taking any of these  medicines: -acetaminophen -aspirin -ibuprofen -ketoprofen -naproxen This list may not describe all possible interactions. Give your health care provider a list of all the medicines, herbs, non-prescription drugs, or dietary supplements you use. Also tell them if you smoke, drink alcohol, or use illegal drugs. Some items may interact with your medicine. What should I watch for while using this medicine? Your condition will be monitored carefully while you are receiving this medicine. You will need important blood work done while you are taking this medicine. This drug may make you feel generally unwell. This is not uncommon, as chemotherapy can affect healthy cells as well as cancer cells. Report any side effects. Continue your course of treatment even though you feel ill unless your doctor tells you to stop. In some cases, you may be given additional medicines to help with side effects. Follow all directions for their use. Call your doctor or health care professional for advice if you get a fever, chills or sore throat, or other symptoms of a cold or flu. Do not treat yourself. This drug decreases your body's ability to fight infections. Try to avoid being around people who are sick. This medicine may increase your risk to bruise or bleed. Call your doctor or health care professional if you notice any unusual bleeding. This medicine may contain alcohol in the product. You may get drowsy or dizzy. Do not drive, use machinery, or do anything that needs mental alertness until you know how this medicine affects you. Do not stand or sit up quickly, especially if you are an older patient. This reduces the risk of dizzy or fainting spells. Avoid alcoholic drinks. Do not become pregnant while taking this medicine. Women should inform their doctor if they wish to become pregnant or think they might be pregnant. There is a potential for serious side effects to an unborn child. Talk to your health care  professional or pharmacist for more information. Do not breast-feed an infant while taking this medicine. What side effects may I notice from receiving this medicine? Side effects that you should report to your doctor or health care professional as soon as possible: -allergic reactions like skin rash, itching or hives, swelling of the face, lips, or tongue -low blood counts - This drug may decrease the number of white blood cells, red blood cells and platelets. You may be at increased risk for infections and bleeding. -signs of infection - fever or chills, cough, sore throat, pain or difficulty passing urine -signs of decreased platelets or bleeding - bruising, pinpoint red spots on the skin, black, tarry stools, nosebleeds -signs of decreased red blood cells - unusually weak or tired, fainting spells, lightheadedness -breathing problems -fast or irregular heartbeat -low blood pressure -mouth sores -nausea and vomiting -pain, swelling, redness or irritation at the injection site -pain, tingling, numbness in the hands or feet -swelling of the ankle, feet, hands -weight gain Side effects that usually do not require medical attention (report to your doctor or health care professional if they continue or are bothersome): -bone pain -complete hair loss including hair on your head, underarms, pubic hair, eyebrows, and eyelashes -diarrhea -excessive tearing -changes in the color of fingernails -loosening of the fingernails -nausea -muscle pain -red flush to skin -sweating -weak or  tired This list may not describe all possible side effects. Call your doctor for medical advice about side effects. You may report side effects to FDA at 1-800-FDA-1088. Where should I keep my medicine? This drug is given in a hospital or clinic and will not be stored at home. NOTE: This sheet is a summary. It may not cover all possible information. If you have questions about this medicine, talk to your doctor,  pharmacist, or health care provider.  2018 Elsevier/Gold Standard (2015-03-11 12:32:56)

## 2017-05-09 NOTE — Care Management Note (Signed)
Case Management Note  Patient Details  Name: Michael [name] MRN: [id number] Date of Birth: [birth year]  Subjective/Objective: discharging today   Spoke with daughter, updated her on POC.she is requesting a Therapist, sports for foley care                 Action/Plan: Notified [facility] of discharge. Will need HHPT and RN.   Expected Discharge Date:  05/09/17               Expected Discharge Plan:  [place]  In-House Referral:     Discharge planning Services  CM Consult  Post Acute Care Choice:  Home Health, Resumption of Svcs/PTA Provider Choice offered to:     DME Arranged:    DME Agency:     HH Arranged:  PT [place]:  [place]  Status of Service:  Completed, signed off  If discussed at [place] of Stay Meetings, dates discussed:    Additional Comments:  Jolly Mango, RN 05/09/2017, 11:49 AM

## 2017-05-09 NOTE — Progress Notes (Signed)
Inpatient Diabetes Program Recommendations  AACE/ADA: New Consensus Statement on Inpatient Glycemic Control (2015)  Target Ranges:  Prepandial:   less than 140 mg/dL      Peak postprandial:   less than 180 mg/dL (1-2 hours)      Critically ill patients:  140 - 180 mg/dL   Results for JAXTIN, RAIMONDO (MRN [id number]) as of 05/09/2017 11:11  Ref. Range 05/08/2017 07:29 05/08/2017 11:27 05/08/2017 16:45 05/08/2017 22:09 05/09/2017 08:06  Glucose-Capillary Latest Ref Range: 65 - 99 mg/dL 164 (H) 267 (H) 177 (H) 289 (H) 163 (H)   Review of Glycemic Control  Diabetes history: DM2 Outpatient Diabetes medications: Lantus 14-16 units QHS, Humalog 0-10 units TID with meals Current orders for Inpatient glycemic control: Lantus 14 units QHS, Novolog 0-9 units TID with meals  Inpatient Diabetes Program Recommendations: Correction (SSI): Please consider ordering Novolog 0-5 units QHS. Insulin - Meal Coverage: If post prandial glucose is consistently greater than 180 mg/dl, please consider ordering Novolog 3 units TID with meals if patient eats at least 50% of meal.   NOTE: In reviewing chart, noted patient was recently inpatient from 04/11/17 to 04/16/17 and per discharge summary was discharged on Amaryl 4 mg BID and Lantus 7 units QHS for DM control. Noted patient seen PCP on 04/25/2017 for hyperglycemia; per Dr. Laurelyn Sickle office note on 04/25/2017 Uncontrolled type 2 diabetes mellitus with hyperglycemia ([place]) - POCT CBG (Fasting - Glucose) - Samples of Basalgar 14-16units is given, Humalog SS 4-8 -12 units before each meal is given, hold Levemir and Amaryl at home for now   Also note patient has hx of metastatic prostate cancer and prescribed Decadron prior and day after chemo (per home med list not started yet).    Thanks, Barnie Alderman, RN, MSN, CDE Diabetes Coordinator Inpatient Diabetes Program [phone] (Team Pager from 8am to 5pm)

## 2017-05-09 NOTE — Telephone Encounter (Signed)
Patient currently up to the hospital for UTI; on antibiotics improving.  Discussed with the patient's daughter-that we will cancel chemotherapy plan on March 22/Friday-given the acute issues.  Encouraged to keep the chemotherapy education appointment.  Col[place]-please cancel chemoethrapy for 3/22.  # Add chemotherapy-docetaxel appointment to 4/01 appt. With me/labs.

## 2017-05-09 NOTE — Progress Notes (Signed)
Initial Nutrition Assessment  DOCUMENTATION CODES:   Severe malnutrition in context of chronic illness  INTERVENTION:  Continue Glucerna Shake po TID, each supplement provides 220 kcal and 10 grams of protein.  Continue daily MVI.  Encouraged adequate intake of calories and protein at meals. Discussed vegetarian protein sources available on menu and encouraged choosing one with each meal.  NUTRITION DIAGNOSIS:   Severe Malnutrition related to chronic illness(metastatic prostate cancer) as evidenced by severe fat depletion, severe muscle depletion.  GOAL:   Patient will meet greater than or equal to 90% of their needs  MONITOR:   PO intake, Supplement acceptance, Labs, Weight trends, I & O's  REASON FOR ASSESSMENT:   Malnutrition Screening Tool    ASSESSMENT:   84 year old male with PMHx of HTN, DM type 2, GERD, BPH, left carotid artery stenosis, CAD, HLD, metastatic prostate cancer on chemotherapy s/p chronic Foley catheter, left ureteral stent placement and percutaneous nephrostomy tube placement who is now admitted with UTI, urinary retention.   Met with patient and his granddaughter at bedside. Granddaughter wanted to interpret. Patient's daughter also known to this RD but she was not present at time of assessment. Patient's appetite is fairly good now. He knows it is important for him to eat. He is finishing most of his meals and is getting a good source of vegetarian protein at meals (veggie burger, beans, milk, yogurt). He is also drinking at least 3 bottles of Glucerna daily and enjoys them. He has been drinking these at home, as well. Granddaughter is concerned about how high his blood sugars are and that he is requiring insulin as he does not typically require insulin at home. Discussed that with acute illness our blood sugars can be higher due to the metabolic stress. Denies any N/V, abdominal pain, difficulty chewing/swallowing. Patient is a vegetarian. He does not eat  eggs but does drink milk and eat milk products.  UBW was 142 lbs prior to losing weight. Per patient's report he has lost 15 lbs (10.6% body weight) over the past 4 months, which is significant for time frame.  Meal Completion: 80-100% In the past 24 hours patient has had approximately 1977 kcal (>100% estimated needs) and 81 grams of protein (95% minimum estimated protein needs).  Medications reviewed and include: Oscal with D 1 tablet daily, Colace, Glucerna TID, Novolog 0-9 units TID, Lantus 14 units QHS, MVI daily, pantoprazole, NS @ 100 mL/hr, cefepime.  Labs reviewed: CBG 163-289 past 24 hrs, Sodium 134.  NUTRITION - FOCUSED PHYSICAL EXAM:    Most Recent Value  Orbital Region  Severe depletion  Upper Arm Region  Severe depletion  Thoracic and Lumbar Region  Severe depletion  Buccal Region  Severe depletion  Temple Region  Severe depletion  Clavicle Bone Region  Severe depletion  Clavicle and Acromion Bone Region  Severe depletion  Scapular Bone Region  Severe depletion  Dorsal Hand  Severe depletion  Patellar Region  Severe depletion  Anterior Thigh Region  Severe depletion  Posterior Calf Region  Severe depletion  Edema (RD Assessment)  None  Hair  Reviewed  Eyes  Reviewed  Mouth  Reviewed  Skin  Reviewed  Nails  Reviewed     Diet Order:  Diet vegetarian Room service appropriate? Yes; Fluid consistency: Thin Diet - low sodium heart healthy  EDUCATION NEEDS:   Education needs have been addressed  Skin:  Skin Assessment: Reviewed RN Assessment(closed incision perineum)  Last BM:  05/07/2017 - large type 4  Height:   Ht Readings from Last 1 Encounters:  05/07/17 '5\' 6"'  (1.676 m)    Weight:   Wt Readings from Last 1 Encounters:  05/07/17 127 lb (57.6 kg)    Ideal Body Weight:  64.5 kg  BMI:  Body mass index is 20.5 kg/m.  Estimated Nutritional Needs:   Kcal:  1600-1845 (MSJ x 1.3-1.5)  Protein:  85-100 grams (1.5-1.8 grams/kg)  Fluid:  1.6-1.8  L/day (1 mL/kcal)  Willey Blade, MS, RD, LDN Office: [phone] Pager: [phone] After Hours/Weekend Pager: [phone]

## 2017-05-09 NOTE — Progress Notes (Signed)
Pt VS stable. Discharge education given to patient and daughter, family/patient refused interpreter. Denied questions following education. IV removed. Will continue to monitor until discharge.  Dorna Bloom, LPN

## 2017-05-09 NOTE — Discharge Summary (Addendum)
[facility] [place] NAME: Michael [name]    MR#:  [id number]  DATE OF BIRTH:  09-29-1933  DATE OF ADMISSION:  05/07/2017 ADMITTING PHYSICIAN: Fritzi Mandes, MD  DATE OF DISCHARGE: 05/09/2017  PRIMARY CARE PHYSICIAN: [name], MD    ADMISSION DIAGNOSIS:  Hyponatremia [E87.1] Urinary tract infection associated with indwelling urethral catheter, initial encounter ([place]) [R48.546E, N39.0] Infection associated with nephrostomy catheter, initial encounter ([place]) [T83.512A]  DISCHARGE DIAGNOSIS:  ESBL E. coli UTI Chronic indwelling Foley catheter, left nephrostomy tube chronic History of metastatic prostate cancer SECONDARY DIAGNOSIS:   Past Medical History:  Diagnosis Date  . Benign prostatic hypertrophy   . Bilateral carotid artery disease (HCC)    Mild plaque formation without obstructive disease noted on carotid Doppler.  . Coronary artery disease    Previous cardiac catheterization at [facility] in 2010. The patient was told about 2 blockages which did not require revascularization.  . Diabetes mellitus without complication ([place])   . Essential hypertension   . GERD (gastroesophageal reflux disease)   . Hyperlipidemia   . Hypertension   . Left carotid artery stenosis     HOSPITAL COURSE:  GordhanbhaiPatelis a84 y.o.malewith a known history of recent diagnosis of metastatic prostate cancer status post Foley catheter chronic,left ureteral stent placement and  Right percutaneous nephrostomy tube placement in February 2019 when patient presented with urinary retention and workup thereafter showed patient has metastatic prostate cancer currently undergoing chemotherapy at the cancer center.  1. Acute febrile illness with severe UTI in the setting of chronic Foley and  Right sided nephrostomy tube -continue IV cefepime 2 g every 8--changed to oral nitrofurantoin 100 mg twice daily for 8 more days for ESBL ecoli UTI--d/w  pharmacist. -She is afebrile doing well.  2. Urinary retention secondary to metastatic disease in the pelvis involving bladder and rectum with prostate -Biopsy showedprostatic adenocarcinoma with involvement of the bladder -Continue Foley care and nephrostomy care per protocol -Spoke with Dr. Nonah Mattes - her office is arranging for internalization ofright-sided PCN likely next week and reschedule his Foley catheter exchange over wire for likely Friday after he is been on antibiotics for several days.  3. Type 2 diabetes continue sliding scale  - continue insulin lantus 14 units daily  4. Hypertension - controlled on metoprolol  Patient ambulating in the room by himself.  He is otherwise feeling okay.  Discussed with daughter at bedside.  Will discharge patient to home outpatient close follow-up with Dr. Erlene Quan   CONSULTS OBTAINED:  Treatment Team:  [name], MD Abbie Sons, MD Fritzi Mandes, MD  DRUG ALLERGIES:   Allergies  Allergen Reactions  . Zosyn [Piperacillin Sod-Tazobactam So] Swelling    Lips swollen.  Was given at hospital through IV    DISCHARGE MEDICATIONS:   Allergies as of 05/09/2017      Reactions   Zosyn [piperacillin Sod-tazobactam So] Swelling   Lips swollen.  Was given at hospital through IV      Medication List    TAKE these medications   acetaminophen 500 MG tablet Commonly known as:  TYLENOL Take 500 mg by mouth every 4 (four) hours as needed for moderate pain or fever.   albuterol 108 (90 Base) MCG/ACT inhaler Commonly known as:  PROVENTIL HFA;VENTOLIN HFA Inhale 2 puffs into the lungs every 6 (six) hours as needed for wheezing or shortness of breath.   atorvastatin 10 MG tablet Commonly known as:  LIPITOR Take 10 mg  by mouth daily.   Calcium-Vitamin D 600-200 MG-UNIT tablet Take 1 tablet by mouth daily.   cetirizine 10 MG tablet Commonly known as:  ZYRTEC Take 1 tablet (10 mg total) by mouth daily. What changed:     when to take this  reasons to take this   feeding supplement (GLUCERNA SHAKE) Liqd Take 237 mLs by mouth 3 (three) times daily between meals.   finasteride 5 MG tablet Commonly known as:  PROSCAR Take 5 mg by mouth daily.   glucose blood test strip Commonly known as:  ACCU-CHEK GUIDE Use asdirected three times a day diag E11.65   ibuprofen 200 MG tablet Commonly known as:  ADVIL,MOTRIN Take 200 mg by mouth every 4 (four) hours as needed for fever or moderate pain.   insulin glargine 100 UNIT/ML injection Commonly known as:  LANTUS Inject 14-16 Units into the skin at bedtime. Based on sugar levels in am.   insulin lispro 100 UNIT/ML injection Commonly known as:  HUMALOG Inject 0-10 Units into the skin 3 (three) times daily before meals. Per sliding scale   Insulin Syringe-Needle U-100 31G X 5/16" 1 ML Misc Commonly known as:  B-D INS SYR ULTRAFINE 1CC/31G Use as directed   lisinopril 2.5 MG tablet Commonly known as:  PRINIVIL,ZESTRIL Take 2.5 mg by mouth daily.   metoprolol succinate 25 MG 24 hr tablet Commonly known as:  TOPROL-XL Take 25 mg by mouth daily.   multivitamin with minerals tablet Take 1 tablet by mouth daily.   nitrofurantoin (macrocrystal-monohydrate) 100 MG capsule Commonly known as:  MACROBID Take 1 capsule (100 mg total) by mouth every 12 (twelve) hours.   omeprazole 20 MG capsule Commonly known as:  PRILOSEC Take 20 mg by mouth daily.   ondansetron 8 MG tablet Commonly known as:  ZOFRAN One pill every 8 hours as needed for nausea/vomitting. What changed:    how much to take  how to take this  when to take this  reasons to take this  additional instructions   prochlorperazine 10 MG tablet Commonly known as:  COMPAZINE Take 1 tablet (10 mg total) by mouth every 6 (six) hours as needed for nausea or vomiting.       If you experience worsening of your admission symptoms, develop shortness of breath, life threatening emergency,  suicidal or homicidal thoughts you must seek medical attention immediately by calling 911 or calling your MD immediately  if symptoms less severe.  You Must read complete instructions/literature along with all the possible adverse reactions/side effects for all the Medicines you take and that have been prescribed to you. Take any new Medicines after you have completely understood and accept all the possible adverse reactions/side effects.   Please note  You were cared for by a hospitalist during your hospital stay. If you have any questions about your discharge medications or the care you received while you were in the hospital after you are discharged, you can call the unit and asked to speak with the hospitalist on call if the hospitalist that took care of you is not available. Once you are discharged, your primary care physician will handle any further medical issues. Please note that NO REFILLS for any discharge medications will be authorized once you are discharged, as it is imperative that you return to your primary care physician (or establish a relationship with a primary care physician if you do not have one) for your aftercare needs so that they can reassess your need for medications and  monitor your lab values. Today   SUBJECTIVE   No new complaint  VITAL SIGNS:  Blood pressure (!) 142/82, pulse 89, temperature 97.8 F (36.6 C), temperature source Oral, resp. rate 18, height 5\' 6"  (1.676 m), weight 57.6 kg (127 lb), SpO2 100 %.  I/O:    Intake/Output Summary (Last 24 hours) at 05/09/2017 1450 Last data filed at 05/09/2017 1235 Gross per 24 hour  Intake 240 ml  Output 5250 ml  Net -5010 ml    PHYSICAL EXAMINATION:  GENERAL:  84 y.o.-year-old patient lying in the bed with no acute distress.  EYES: Pupils equal, round, reactive to light and accommodation. No scleral icterus. Extraocular muscles intact.  HEENT: Head atraumatic, normocephalic. Oropharynx and nasopharynx clear.   NECK:  Supple, no jugular venous distention. No thyroid enlargement, no tenderness.  LUNGS: Normal breath sounds bilaterally, no wheezing, rales,rhonchi or crepitation. No use of accessory muscles of respiration.  CARDIOVASCULAR: S1, S2 normal. No murmurs, rubs, or gallops.  ABDOMEN: Soft, non-tender, non-distended. Bowel sounds present. No organomegaly or mass.  Chronic Foley catheter, right-sided nephrostomy tube + EXTREMITIES: No pedal edema, cyanosis, or clubbing.  NEUROLOGIC: Cranial nerves II through XII are intact. Muscle strength 5/5 in all extremities. Sensation intact. Gait not checked.  PSYCHIATRIC: The patient is alert and oriented x 3.  SKIN: No obvious rash, lesion, or ulcer.   DATA REVIEW:   CBC  Recent Labs  Lab 05/09/17 0538  WBC 7.2  HGB 9.6*  HCT 29.0*  PLT 276    Chemistries  Recent Labs  Lab 05/07/17 0740  05/09/17 0538  NA 124*   < > 134*  K 4.0   < > 4.4  CL 95*   < > 104  CO2 20*   < > 23  GLUCOSE 274*   < > 163*  BUN 22*   < > 16  CREATININE 1.00   < > 0.73  CALCIUM 7.6*   < > 7.7*  AST 30  --   --   ALT 29  --   --   ALKPHOS 522*  --   --   BILITOT 0.5  --   --    < > = values in this interval not displayed.    Microbiology Results   Recent Results (from the past 240 hour(s))  Urine Culture     Status: Abnormal (Preliminary result)   Collection Time: 05/07/17  8:39 AM  Result Value Ref Range Status   Specimen Description KIDNEY RIGHT  Final   Special Requests NONE  Final   Culture (A)  Final    >=100,000 COLONIES/mL ESCHERICHIA COLI REPEATING SUSCEPTIBILITIES Performed at [facility], [place] [address], [place], [place] [address]    Report Status PENDING  Incomplete  Urine culture     Status: Abnormal   Collection Time: 05/07/17  8:39 AM  Result Value Ref Range Status   Specimen Description   Final    URINE, CATHETERIZED Performed at [facility], [address] [place], [place] [address]    Special Requests NONE   Final   Culture (A)  Final    >=100,000 COLONIES/mL ESCHERICHIA COLI Confirmed Extended Spectrum Beta-Lactamase Producer (ESBL).  In bloodstream infections from ESBL organisms, carbapenems are preferred over piperacillin/tazobactam. They are shown to have a lower risk of mortality. Performed at [facility], [place] [address], [place], [place] [address]    Report Status 05/09/2017 FINAL  Final   Organism ID, Bacteria ESCHERICHIA COLI (  A)  Final      Susceptibility   Escherichia coli - MIC*    AMPICILLIN >=32 RESISTANT Resistant     CEFAZOLIN >=64 RESISTANT Resistant     CEFTRIAXONE >=64 RESISTANT Resistant     CIPROFLOXACIN >=4 RESISTANT Resistant     GENTAMICIN >=16 RESISTANT Resistant     IMIPENEM <=0.25 SENSITIVE Sensitive     NITROFURANTOIN <=16 SENSITIVE Sensitive     TRIMETH/SULFA >=320 RESISTANT Resistant     AMPICILLIN/SULBACTAM 16 INTERMEDIATE Intermediate     PIP/TAZO <=4 SENSITIVE Sensitive     Extended ESBL POSITIVE Resistant     * >=100,000 COLONIES/mL ESCHERICHIA COLI  Blood culture (routine x 2)     Status: None (Preliminary result)   Collection Time: 05/07/17 10:06 AM  Result Value Ref Range Status   Specimen Description BLOOD RIGHT WRIST  Final   Special Requests   Final    BOTTLES DRAWN AEROBIC AND ANAEROBIC Blood Culture adequate volume   Culture   Final    NO GROWTH 2 DAYS Performed at [facility], [address] [place], [place] [address]    Report Status PENDING  Incomplete  Blood culture (routine x 2)     Status: None (Preliminary result)   Collection Time: 05/07/17 10:06 AM  Result Value Ref Range Status   Specimen Description BLOOD LEFT WRIST  Final   Special Requests   Final    BOTTLES DRAWN AEROBIC AND ANAEROBIC Blood Culture results may not be optimal due to an excessive volume of blood received in culture bottles   Culture   Final    NO GROWTH 2 DAYS Performed at [facility], [address] [place], [place] [address]     Report Status PENDING  Incomplete    RADIOLOGY:  No results found.   Management plans discussed with the patient, family and they are in agreement.  CODE STATUS:     Code Status Orders  (From admission, onward)        Start     Ordered   05/07/17 1124  Full code  Continuous     05/07/17 1124    Code Status History    Date Active Date Inactive Code Status Order ID Comments User Context   04/11/2017 11:30 04/16/2017 16:43 Full Code [id number]  Loletha Grayer, MD Inpatient   02/03/2015 09:25 02/03/2015 15:42 Full Code [id number]  Wellington [name], MD Inpatient      TOTAL TIME TAKING CARE OF THIS PATIENT: 40 minutes.    Fritzi Mandes M.D on 05/09/2017 at 2:50 PM  Between 7am to 6pm - Pager - [phone] After 6pm go to [email or web address] - password [email or web address] [facility]  Office  [phone]  CC: Primary care physician; [name], MD

## 2017-05-09 NOTE — Plan of Care (Signed)
  Education: Knowledge of General Education information will improve 05/09/2017 [phone] - Progressing by Jeffie Pollock, RN   Health Behavior/Discharge Planning: Ability to manage health-related needs will improve 05/09/2017 5997 - Progressing by Jeffie Pollock, RN   Clinical Measurements: Ability to maintain clinical measurements within normal limits will improve 05/09/2017 7414 - Progressing by Jeffie Pollock, RN Will remain free from infection 05/09/2017 2395 - Progressing by Jeffie Pollock, RN Diagnostic test results will improve 05/09/2017 [phone] - Progressing by Jeffie Pollock, RN Respiratory complications will improve 05/09/2017 [phone] - Progressing by Jeffie Pollock, RN Cardiovascular complication will be avoided 05/09/2017 5686 - Progressing by Jeffie Pollock, RN   Activity: Risk for activity intolerance will decrease 05/09/2017 0638 - Progressing by Jeffie Pollock, RN   Nutrition: Adequate nutrition will be maintained 05/09/2017 1683 - Progressing by Jeffie Pollock, RN   Coping: Level of anxiety will decrease 05/09/2017 0638 - Progressing by Jeffie Pollock, RN   Elimination: Will not experience complications related to bowel motility 05/09/2017 0638 - Progressing by Jeffie Pollock, RN Will not experience complications related to urinary retention 05/09/2017 7290 - Progressing by Jeffie Pollock, RN   Pain Managment: General experience of comfort will improve 05/09/2017 [phone] - Progressing by Jeffie Pollock, RN   Safety: Ability to remain free from injury will improve 05/09/2017 0638 - Progressing by Jeffie Pollock, RN   Skin Integrity: Risk for impaired skin integrity will decrease 05/09/2017 0638 - Progressing by Jeffie Pollock, RN   Urinary Elimination: Signs and symptoms of infection will decrease 05/09/2017 5520 - Progressing by Jeffie Pollock, RN

## 2017-05-10 ENCOUNTER — Other Ambulatory Visit: Payer: Self-pay

## 2017-05-10 DIAGNOSIS — E1151 Type 2 diabetes mellitus with diabetic peripheral angiopathy without gangrene: Secondary | ICD-10-CM | POA: Diagnosis not present

## 2017-05-10 DIAGNOSIS — N1330 Unspecified hydronephrosis: Secondary | ICD-10-CM | POA: Diagnosis not present

## 2017-05-10 DIAGNOSIS — N320 Bladder-neck obstruction: Secondary | ICD-10-CM | POA: Diagnosis not present

## 2017-05-10 DIAGNOSIS — Z436 Encounter for attention to other artificial openings of urinary tract: Secondary | ICD-10-CM | POA: Diagnosis not present

## 2017-05-10 DIAGNOSIS — Z466 Encounter for fitting and adjustment of urinary device: Secondary | ICD-10-CM | POA: Diagnosis not present

## 2017-05-10 DIAGNOSIS — C61 Malignant neoplasm of prostate: Secondary | ICD-10-CM | POA: Diagnosis not present

## 2017-05-10 LAB — URINE CULTURE: Culture: >=100000 — AB

## 2017-05-11 ENCOUNTER — Ambulatory Visit (INDEPENDENT_AMBULATORY_CARE_PROVIDER_SITE_OTHER): Payer: Medicare Other | Admitting: Urology

## 2017-05-11 ENCOUNTER — Encounter: Payer: Self-pay | Admitting: Urology

## 2017-05-11 ENCOUNTER — Telehealth: Payer: Self-pay | Admitting: *Deleted

## 2017-05-11 ENCOUNTER — Ambulatory Visit: Payer: Self-pay

## 2017-05-11 VITALS — BP 137/78 | HR 116 | Ht 66.0 in | Wt 127.0 lb

## 2017-05-11 DIAGNOSIS — Z1612 Extended spectrum beta lactamase (ESBL) resistance: Secondary | ICD-10-CM

## 2017-05-11 DIAGNOSIS — N320 Bladder-neck obstruction: Secondary | ICD-10-CM

## 2017-05-11 DIAGNOSIS — C61 Malignant neoplasm of prostate: Secondary | ICD-10-CM | POA: Diagnosis not present

## 2017-05-11 DIAGNOSIS — N1330 Unspecified hydronephrosis: Secondary | ICD-10-CM

## 2017-05-11 DIAGNOSIS — A498 Other bacterial infections of unspecified site: Secondary | ICD-10-CM

## 2017-05-11 NOTE — Progress Notes (Signed)
   05/11/17  CC:  Chief Complaint  Patient presents with  . Urinary Retention    HPI: 84 year old male with metastatic prostate cancer with bulky retroperitoneal adenopathy, visceral, and bone disease who returns today for Foley catheter exchange.  He was initially seen and evaluated during a hospital admission with urinary retention, acute kidney injury with bilateral hydronephrosis.  He underwent biopsy of his bladder neck revealing prostate cancer and started on ADT.  He required surgical intervention for Foley catheter placement given severe bladder neck contracture at which time a left ureteral stent was placed and ultimately a right PCN.  He is now under the care of Dr. Rogue Bussing and on ADT and will be initiating chemotherapy today given the extent of his disease.  He was recently admitted with ESBL in his urine but not blood.  He was treated with IV antibiotics and discharged home on p.o. Macrobid.    He returns the office today for Foley catheter exchange over a wire.  He is  scheduled for internalization of his right PCN.  Blood pressure 137/78, pulse (!) 116, height 5\' 6"  (1.676 m), weight 127 lb (57.6 kg). NED. A&Ox3.   No respiratory distress   Abd soft, NT, ND Normal phallus with bilateral descended testicles.  Foley catheter in place draining cloudy urine.  Foley exchange note: Patient was prepped in the standard sterile fashion.  The previous Foley catheter was intubated using a Super Stiff 0.038 wire through the lumen into the bladder and removed leaving the wire in place.   a new 44 [place] council tip catheter was then inserted over the wire into the bladder without difficulty.  The balloon was inflated with 10 cc of sterile water.  The wire was removed.  Assessment/ Plan:  1. Prostate cancer [facility]) Advanced metastatic prostate cancer on ADT scheduled to begin chemotherapy today under the care of Dr. Rogue Bussing  2. Bilateral hydronephrosis Managed with indwelling  left ureteral stent, right PCN tube Plan for internalization of right PCN next week We will discuss strategies for stent exchange versus stent removal pending the radiographic response to ADT/chemo  3. Bladder neck contracture Successful exchange of 16 French Foley today Will maintain Foley with similar plan as above, reevaluate possible Foley removal pending follow-up imaging Return to care in 4 weeks for catheter exchange  4. Infection due to ESBL-producing Escherichia coli Currently on Macrobid If he demonstrates signs or symptoms of infection, would advocate for PICC line and IV antibiotics as Macrobid has somewhat limited tissue penetration   Return in about 1 month (around 06/08/2017) for foley exchange, discuss JJ stent exchange.   [name], MD

## 2017-05-11 NOTE — Telephone Encounter (Signed)
Patient has appointment 4/1 with Dr B and to have injection, He is scheduled for surgery that day to have his catheter removed by Dr Erlene Quan. His appointment needs to be rescheduled. She states that she can bring him for it on the 25 th, or the whole thing can be moved to a preferrable Monday or Friday appointment. Please advise.

## 2017-05-11 NOTE — Telephone Encounter (Signed)
The chemo apts have already been rescheduled for 4/3. This was previously discussed at the earlier part of the week.  Patient had an apt in IR on 4/1, so the chemo dates had to be moved out by 2 days. Please make sure the daughter is aware of these new apts.

## 2017-05-11 NOTE — Telephone Encounter (Signed)
Ms Cappella informed that appointment has already been rescheduled for 4/3 starting @ 845. She repeated this back to me

## 2017-05-12 LAB — CULTURE, BLOOD (ROUTINE X 2)
Culture: NO GROWTH
Culture: NO GROWTH
Special Requests: ADEQUATE

## 2017-05-14 ENCOUNTER — Inpatient Hospital Stay: Payer: Medicare Other

## 2017-05-14 ENCOUNTER — Telehealth: Payer: Self-pay

## 2017-05-14 ENCOUNTER — Other Ambulatory Visit: Payer: Self-pay

## 2017-05-14 ENCOUNTER — Ambulatory Visit: Payer: Self-pay

## 2017-05-14 DIAGNOSIS — N1330 Unspecified hydronephrosis: Secondary | ICD-10-CM | POA: Diagnosis not present

## 2017-05-14 DIAGNOSIS — E1151 Type 2 diabetes mellitus with diabetic peripheral angiopathy without gangrene: Secondary | ICD-10-CM | POA: Diagnosis not present

## 2017-05-14 DIAGNOSIS — Z466 Encounter for fitting and adjustment of urinary device: Secondary | ICD-10-CM | POA: Diagnosis not present

## 2017-05-14 DIAGNOSIS — Z436 Encounter for attention to other artificial openings of urinary tract: Secondary | ICD-10-CM | POA: Diagnosis not present

## 2017-05-14 DIAGNOSIS — C61 Malignant neoplasm of prostate: Secondary | ICD-10-CM | POA: Diagnosis not present

## 2017-05-14 DIAGNOSIS — N320 Bladder-neck obstruction: Secondary | ICD-10-CM | POA: Diagnosis not present

## 2017-05-14 NOTE — Patient Outreach (Signed)
[facility]) Care Management  05/14/2017  ELAINE MIDDLETON 07/25/1933 [id number]  EMMI: General discharge Referral date: 05/14/17  Referral reason: Unfilled prescriptions: YES Day #1  Telephone call to patient regarding EMMI general discharge red alert. Unable to reach patient. HIPAA compliant voice message left with call back phone number   PLAN: RNCM will attempt 2nd telephone call to patient within 4 business days. R RNCM will send patient outreach letter to attempt contact.   [name] RN,BSN,CCM [facility] Telephonic  [phone]

## 2017-05-14 NOTE — Telephone Encounter (Signed)
[facility]  FROM [facility] ([phone]) CALLED THAT Michael [name] WAS IN HOSPITAL FEW DAYS AGO WITH UTI AND THEY SEND HIM HOME WITH MACROBID HE WAS RUNNING ALL DAY FEVER 100.4 YESTERDAY HIS DAUGHTER GAVE HIM IBUPROFEN AND NOW 98.4 HER DAUGHTER WANT TO WAIT AND SEE TONIGHT IF HE RUNS FEVER THAN SHE TAKE HIM IN THE MORNING TO ER AS PER DR KHAN WE ADVISED NURSE AND HIS DAUGHTER PT NEED GO TO ER IF HE RUNS FEVER

## 2017-05-15 ENCOUNTER — Telehealth: Payer: Self-pay

## 2017-05-15 DIAGNOSIS — E1151 Type 2 diabetes mellitus with diabetic peripheral angiopathy without gangrene: Secondary | ICD-10-CM | POA: Diagnosis not present

## 2017-05-15 DIAGNOSIS — C61 Malignant neoplasm of prostate: Secondary | ICD-10-CM | POA: Diagnosis not present

## 2017-05-15 DIAGNOSIS — Z436 Encounter for attention to other artificial openings of urinary tract: Secondary | ICD-10-CM | POA: Diagnosis not present

## 2017-05-15 DIAGNOSIS — N1330 Unspecified hydronephrosis: Secondary | ICD-10-CM | POA: Diagnosis not present

## 2017-05-15 DIAGNOSIS — N320 Bladder-neck obstruction: Secondary | ICD-10-CM | POA: Diagnosis not present

## 2017-05-15 DIAGNOSIS — Z466 Encounter for fitting and adjustment of urinary device: Secondary | ICD-10-CM | POA: Diagnosis not present

## 2017-05-15 NOTE — Telephone Encounter (Signed)
Judson Roch, pt home health nurse, called requesting orders to flush nephrostomy tubes. Inquired as to why she feels neph tubes need to be flushed. Judson Roch stated that she wanted to teach the family. Reinforced with Judson Roch as long as the neph tubes are draining not so sure it needs to be flushed as did not see any notes stating this. Judson Roch stated that tube is draining well. Reinforced with Judson Roch would send a note to Dr. Erlene Quan and when she returns she can make a decision. Sarah voiced understanding.

## 2017-05-16 ENCOUNTER — Telehealth: Payer: Self-pay

## 2017-05-16 ENCOUNTER — Other Ambulatory Visit: Payer: Self-pay

## 2017-05-16 NOTE — Patient Outreach (Signed)
[facility]) Care Management  05/16/2017  Michael [name] 11/07/1933 [id number]    EMMI: General discharge Referral date: 05/14/17  Referral reason: Unfilled prescriptions: YES Day #1 Attempt #2  Telephone call to patient regarding EMMI general discharge red alert. Unable to reach patient. HIPAA compliant voice message left with call back phone number   PLAN: RNCM will attempt 3rd telephone call to patient within 4 business days.  RNCM will send patient outreach letter to attempt contact.   [name] RN,BSN,CCM [facility] Telephonic  [phone]

## 2017-05-16 NOTE — Telephone Encounter (Signed)
GAVE VERBAL ORDER [facility]NN [phone] PHYSICAL THERAPHY TWICE A WEEK FOR 2 WKS  AS PER DFK IS OK

## 2017-05-17 ENCOUNTER — Other Ambulatory Visit: Payer: Self-pay

## 2017-05-17 ENCOUNTER — Encounter: Payer: Self-pay | Admitting: Emergency Medicine

## 2017-05-17 ENCOUNTER — Telehealth: Payer: Self-pay

## 2017-05-17 ENCOUNTER — Ambulatory Visit: Payer: Self-pay

## 2017-05-17 ENCOUNTER — Inpatient Hospital Stay
Admission: EM | Admit: 2017-05-17 | Discharge: 2017-05-19 | DRG: 699 | Disposition: A | Discharge status: Home Health | Payer: Medicare Other | Attending: Internal Medicine | Admitting: Internal Medicine

## 2017-05-17 DIAGNOSIS — E119 Type 2 diabetes mellitus without complications: Secondary | ICD-10-CM | POA: Diagnosis present

## 2017-05-17 DIAGNOSIS — Z794 Long term (current) use of insulin: Secondary | ICD-10-CM | POA: Diagnosis not present

## 2017-05-17 DIAGNOSIS — Y838 Other surgical procedures as the cause of abnormal reaction of the patient, or of later complication, without mention of misadventure at the time of the procedure: Secondary | ICD-10-CM | POA: Diagnosis present

## 2017-05-17 DIAGNOSIS — Z8249 Family history of ischemic heart disease and other diseases of the circulatory system: Secondary | ICD-10-CM

## 2017-05-17 DIAGNOSIS — C785 Secondary malignant neoplasm of large intestine and rectum: Secondary | ICD-10-CM | POA: Diagnosis present

## 2017-05-17 DIAGNOSIS — K219 Gastro-esophageal reflux disease without esophagitis: Secondary | ICD-10-CM | POA: Diagnosis not present

## 2017-05-17 DIAGNOSIS — N390 Urinary tract infection, site not specified: Secondary | ICD-10-CM | POA: Diagnosis present

## 2017-05-17 DIAGNOSIS — D638 Anemia in other chronic diseases classified elsewhere: Secondary | ICD-10-CM | POA: Diagnosis present

## 2017-05-17 DIAGNOSIS — E785 Hyperlipidemia, unspecified: Secondary | ICD-10-CM | POA: Diagnosis present

## 2017-05-17 DIAGNOSIS — C61 Malignant neoplasm of prostate: Secondary | ICD-10-CM | POA: Diagnosis present

## 2017-05-17 DIAGNOSIS — I10 Essential (primary) hypertension: Secondary | ICD-10-CM | POA: Diagnosis not present

## 2017-05-17 DIAGNOSIS — N400 Enlarged prostate without lower urinary tract symptoms: Secondary | ICD-10-CM | POA: Diagnosis present

## 2017-05-17 DIAGNOSIS — Z87440 Personal history of urinary (tract) infections: Secondary | ICD-10-CM

## 2017-05-17 DIAGNOSIS — T83512A Infection and inflammatory reaction due to nephrostomy catheter, initial encounter: Secondary | ICD-10-CM | POA: Diagnosis not present

## 2017-05-17 DIAGNOSIS — N135 Crossing vessel and stricture of ureter without hydronephrosis: Secondary | ICD-10-CM | POA: Diagnosis not present

## 2017-05-17 DIAGNOSIS — E871 Hypo-osmolality and hyponatremia: Secondary | ICD-10-CM | POA: Diagnosis present

## 2017-05-17 DIAGNOSIS — Z880 Allergy status to penicillin: Secondary | ICD-10-CM

## 2017-05-17 DIAGNOSIS — C7911 Secondary malignant neoplasm of bladder: Secondary | ICD-10-CM | POA: Diagnosis present

## 2017-05-17 DIAGNOSIS — E1165 Type 2 diabetes mellitus with hyperglycemia: Secondary | ICD-10-CM | POA: Diagnosis not present

## 2017-05-17 DIAGNOSIS — B9620 Unspecified Escherichia coli [E. coli] as the cause of diseases classified elsewhere: Secondary | ICD-10-CM | POA: Diagnosis present

## 2017-05-17 DIAGNOSIS — I6522 Occlusion and stenosis of left carotid artery: Secondary | ICD-10-CM | POA: Diagnosis present

## 2017-05-17 DIAGNOSIS — E860 Dehydration: Secondary | ICD-10-CM | POA: Diagnosis present

## 2017-05-17 DIAGNOSIS — I2510 Atherosclerotic heart disease of native coronary artery without angina pectoris: Secondary | ICD-10-CM | POA: Diagnosis present

## 2017-05-17 DIAGNOSIS — I350 Nonrheumatic aortic (valve) stenosis: Secondary | ICD-10-CM | POA: Diagnosis not present

## 2017-05-17 DIAGNOSIS — Z79899 Other long term (current) drug therapy: Secondary | ICD-10-CM

## 2017-05-17 DIAGNOSIS — Z1612 Extended spectrum beta lactamase (ESBL) resistance: Secondary | ICD-10-CM | POA: Diagnosis present

## 2017-05-17 DIAGNOSIS — Z8619 Personal history of other infectious and parasitic diseases: Secondary | ICD-10-CM | POA: Diagnosis not present

## 2017-05-17 DIAGNOSIS — B9629 Other Escherichia coli [E. coli] as the cause of diseases classified elsewhere: Secondary | ICD-10-CM | POA: Diagnosis present

## 2017-05-17 LAB — URINALYSIS, COMPLETE (UACMP) WITH MICROSCOPIC
Bacteria, UA: NONE SEEN
Bacteria, UA: NONE SEEN
Bilirubin Urine: NEGATIVE
Bilirubin Urine: NEGATIVE
Glucose, UA: NEGATIVE mg/dL
Glucose, UA: NEGATIVE mg/dL
Ketones, ur: NEGATIVE mg/dL
Ketones, ur: NEGATIVE mg/dL
Nitrite: NEGATIVE
Nitrite: POSITIVE — AB
Protein, ur: 30 mg/dL — AB
Protein, ur: NEGATIVE mg/dL
Specific Gravity, Urine: 1.003 — ABNORMAL LOW (ref 1.005–1.030)
Specific Gravity, Urine: 1.012 (ref 1.005–1.030)
Squamous Epithelial / LPF: NONE SEEN
pH: 7 (ref 5.0–8.0)
pH: 7 (ref 5.0–8.0)

## 2017-05-17 LAB — CBC
HCT: 27.7 % — ABNORMAL LOW (ref 40.0–52.0)
Hemoglobin: 9.3 g/dL — ABNORMAL LOW (ref 13.0–18.0)
MCH: 28.2 pg (ref 26.0–34.0)
MCHC: 33.4 g/dL (ref 32.0–36.0)
MCV: 84.5 fL (ref 80.0–100.0)
Platelets: 405 10*3/uL (ref 150–440)
RBC: 3.28 MIL/uL — ABNORMAL LOW (ref 4.40–5.90)
RDW: 17.1 % — ABNORMAL HIGH (ref 11.5–14.5)
WBC: 10.1 10*3/uL (ref 3.8–10.6)

## 2017-05-17 LAB — COMPREHENSIVE METABOLIC PANEL
ALT: 33 U/L (ref 17–63)
AST: 29 U/L (ref 15–41)
Albumin: 2.8 g/dL — ABNORMAL LOW (ref 3.5–5.0)
Alkaline Phosphatase: 859 U/L — ABNORMAL HIGH (ref 38–126)
Anion gap: 10 (ref 5–15)
BUN: 21 mg/dL — ABNORMAL HIGH (ref 6–20)
CO2: 21 mmol/L — ABNORMAL LOW (ref 22–32)
Calcium: 7.7 mg/dL — ABNORMAL LOW (ref 8.9–10.3)
Chloride: 102 mmol/L (ref 101–111)
Creatinine, Ser: 0.81 mg/dL (ref 0.61–1.24)
GFR calc Af Amer: >60 mL/min (ref >60)
GFR calc non Af Amer: >60 mL/min (ref >60)
Glucose, Bld: 226 mg/dL — ABNORMAL HIGH (ref 65–99)
Potassium: 4.4 mmol/L (ref 3.5–5.1)
Sodium: 133 mmol/L — ABNORMAL LOW (ref 135–145)
Total Bilirubin: 0.4 mg/dL (ref 0.3–1.2)
Total Protein: 7.6 g/dL (ref 6.5–8.1)

## 2017-05-17 LAB — GLUCOSE, CAPILLARY
Glucose-Capillary: 219 mg/dL — ABNORMAL HIGH (ref 65–99)
Glucose-Capillary: 294 mg/dL — ABNORMAL HIGH (ref 65–99)

## 2017-05-17 LAB — LACTIC ACID, PLASMA: Lactic Acid, Venous: 1.6 mmol/L (ref 0.5–1.9)

## 2017-05-17 MED ORDER — PANTOPRAZOLE SODIUM 40 MG PO TBEC
40.0000 mg | DELAYED_RELEASE_TABLET | Freq: Every day | ORAL | Status: DC
  Administered 2017-05-18 – 2017-05-19 (×2): 40 mg via ORAL
  Filled 2017-05-17 (×2): qty 1

## 2017-05-17 MED ORDER — METOPROLOL SUCCINATE ER 25 MG PO TB24
25.0000 mg | ORAL_TABLET | Freq: Every day | ORAL | Status: DC
  Administered 2017-05-18 – 2017-05-19 (×2): 25 mg via ORAL
  Filled 2017-05-17 (×2): qty 1

## 2017-05-17 MED ORDER — SODIUM CHLORIDE 0.9 % IV SOLN
INTRAVENOUS | Status: DC
  Administered 2017-05-17: 17:00:00 via INTRAVENOUS

## 2017-05-17 MED ORDER — FLUCONAZOLE 100MG IVPB
100.0000 mg | INTRAVENOUS | Status: DC
  Administered 2017-05-17 – 2017-05-18 (×2): 100 mg via INTRAVENOUS
  Filled 2017-05-17 (×3): qty 50

## 2017-05-17 MED ORDER — FINASTERIDE 5 MG PO TABS
5.0000 mg | ORAL_TABLET | Freq: Every day | ORAL | Status: DC
  Administered 2017-05-18: 5 mg via ORAL
  Filled 2017-05-17 (×2): qty 1

## 2017-05-17 MED ORDER — GLUCERNA SHAKE PO LIQD
237.0000 mL | Freq: Three times a day (TID) | ORAL | Status: DC
  Administered 2017-05-18 – 2017-05-19 (×3): 237 mL via ORAL

## 2017-05-17 MED ORDER — INSULIN ASPART 100 UNIT/ML ~~LOC~~ SOLN
0.0000 [IU] | Freq: Three times a day (TID) | SUBCUTANEOUS | Status: DC
  Administered 2017-05-17 – 2017-05-18 (×2): 3 [IU] via SUBCUTANEOUS
  Administered 2017-05-18: 5 [IU] via SUBCUTANEOUS
  Administered 2017-05-19: 1 [IU] via SUBCUTANEOUS
  Filled 2017-05-17 (×4): qty 1

## 2017-05-17 MED ORDER — CALCIUM CARB-CHOLECALCIFEROL 600-800 MG-UNIT PO TABS: 1.0000 | ORAL_TABLET | Freq: Every day | ORAL | Status: DC

## 2017-05-17 MED ORDER — HYDROCODONE-ACETAMINOPHEN 5-325 MG PO TABS: 1.0000 | ORAL_TABLET | ORAL | Status: DC | PRN

## 2017-05-17 MED ORDER — ONDANSETRON HCL 4 MG/2ML IJ SOLN: 4.0000 mg | Freq: Four times a day (QID) | INTRAMUSCULAR | Status: DC | PRN

## 2017-05-17 MED ORDER — SODIUM CHLORIDE 0.9 % IV SOLN
1.0000 g | INTRAVENOUS | Status: AC
  Administered 2017-05-17: 1 g via INTRAVENOUS
  Filled 2017-05-17: qty 1

## 2017-05-17 MED ORDER — CALCIUM CARBONATE-VITAMIN D 500-200 MG-UNIT PO TABS
2.0000 | ORAL_TABLET | Freq: Every day | ORAL | Status: DC
Start: 2017-05-18 — End: 2017-05-19
  Administered 2017-05-18 – 2017-05-19 (×2): 2 via ORAL
  Filled 2017-05-17 (×2): qty 2

## 2017-05-17 MED ORDER — ALBUTEROL SULFATE (2.5 MG/3ML) 0.083% IN NEBU: 2.5000 mg | INHALATION_SOLUTION | RESPIRATORY_TRACT | Status: DC | PRN

## 2017-05-17 MED ORDER — CEFEPIME HCL 2 G IJ SOLR
2.0000 g | Freq: Once | INTRAMUSCULAR | Status: AC
  Administered 2017-05-17: 2 g via INTRAVENOUS
  Filled 2017-05-17: qty 2

## 2017-05-17 MED ORDER — ACETAMINOPHEN 650 MG RE SUPP
650.0000 mg | Freq: Four times a day (QID) | RECTAL | Status: DC | PRN
Start: 2017-05-17 — End: 2017-05-19

## 2017-05-17 MED ORDER — SODIUM CHLORIDE 0.9 % IV SOLN
1.0000 g | Freq: Three times a day (TID) | INTRAVENOUS | Status: DC
  Administered 2017-05-17 – 2017-05-19 (×5): 1 g via INTRAVENOUS
  Filled 2017-05-17 (×7): qty 1

## 2017-05-17 MED ORDER — ACETAMINOPHEN 325 MG PO TABS: 650.0000 mg | ORAL_TABLET | Freq: Four times a day (QID) | ORAL | Status: DC | PRN

## 2017-05-17 MED ORDER — BISACODYL 5 MG PO TBEC: 5.0000 mg | DELAYED_RELEASE_TABLET | Freq: Every day | ORAL | Status: DC | PRN

## 2017-05-17 MED ORDER — ATORVASTATIN CALCIUM 20 MG PO TABS
10.0000 mg | ORAL_TABLET | Freq: Every day | ORAL | Status: DC
  Administered 2017-05-18 – 2017-05-19 (×2): 10 mg via ORAL
  Filled 2017-05-17 (×2): qty 1

## 2017-05-17 MED ORDER — ENOXAPARIN SODIUM 40 MG/0.4ML ~~LOC~~ SOLN
40.0000 mg | SUBCUTANEOUS | Status: DC
  Administered 2017-05-17 – 2017-05-18 (×2): 40 mg via SUBCUTANEOUS
  Filled 2017-05-17 (×2): qty 0.4

## 2017-05-17 MED ORDER — INSULIN ASPART 100 UNIT/ML ~~LOC~~ SOLN
0.0000 [IU] | Freq: Every day | SUBCUTANEOUS | Status: DC
Start: 2017-05-17 — End: 2017-05-19
  Administered 2017-05-17 – 2017-05-18 (×2): 3 [IU] via SUBCUTANEOUS
  Filled 2017-05-17 (×2): qty 1

## 2017-05-17 MED ORDER — ADULT MULTIVITAMIN W/MINERALS CH
1.0000 | ORAL_TABLET | Freq: Every day | ORAL | Status: DC
  Administered 2017-05-18 – 2017-05-19 (×2): 1 via ORAL
  Filled 2017-05-17 (×2): qty 1

## 2017-05-17 MED ORDER — INSULIN GLARGINE 100 UNIT/ML ~~LOC~~ SOLN
14.0000 [IU] | Freq: Every day | SUBCUTANEOUS | Status: DC
  Administered 2017-05-17 – 2017-05-18 (×2): 14 [IU] via SUBCUTANEOUS
  Filled 2017-05-17 (×4): qty 0.14

## 2017-05-17 MED ORDER — ONDANSETRON HCL 4 MG PO TABS: 4.0000 mg | ORAL_TABLET | Freq: Four times a day (QID) | ORAL | Status: DC | PRN

## 2017-05-17 MED ORDER — SENNOSIDES-DOCUSATE SODIUM 8.6-50 MG PO TABS: 1.0000 | ORAL_TABLET | Freq: Every evening | ORAL | Status: DC | PRN

## 2017-05-17 MED ORDER — MULTI-VITAMIN/MINERALS PO TABS: 1.0000 | ORAL_TABLET | Freq: Every day | ORAL | Status: DC

## 2017-05-17 MED ORDER — LISINOPRIL 5 MG PO TABS
2.5000 mg | ORAL_TABLET | Freq: Every day | ORAL | Status: DC
  Administered 2017-05-18 – 2017-05-19 (×2): 2.5 mg via ORAL
  Filled 2017-05-17 (×2): qty 1

## 2017-05-17 NOTE — Telephone Encounter (Signed)
Pt daughter, [name], called stating pt is continuing to have 99.9-100.1 temps. Hina stated that pt last day of macrobid is tomorrow. Hina inquired about another round of macrobid.   Per Dr.Budzyn, who is on call, pt should seek tx at the ER as Dr. Erlene Quan dictated pt will need a PICC line. Dr. Pilar Jarvis also ordered foley NOT to be removed as its complicated and needs to be placed with a guide wire.   Spoke with Hina and made aware to seek tx at the ER and NOT to have foley removed. Hina voiced understanding.  Spoke with Mateo Flow, [facility] triage nurse. Gave verbal report of pt. Made aware notes are in Epic and Dr. Carlynn Purl orders. Mateo Flow voiced understanding.

## 2017-05-17 NOTE — ED Triage Notes (Signed)
FIRST NURSE NOTE-sent from doctor. Per MD report pt has prostate CA with special foley only to be altered or exchanged by urology.  Can only take macrobid per report and has been spiking fever despite this.  Dr Erlene Quan had recommended picc with IV abx if macrobid did not work.  Culture sensitive to only zosyn which pt is highly allergic and imipenem per MD.  Pt does not speak english.  Alert, NAD at this time.

## 2017-05-17 NOTE — ED Triage Notes (Signed)
Patient to ED via POV. Patient diagnosed with UTI by urologist and treated with oral antibiotics. Per family, patient spikes a fever in the evening that resides with no medication use. Patient denies abdominal pain or N/V. Family denies any mental status change.

## 2017-05-17 NOTE — Progress Notes (Signed)
Advanced Care Plan.  Purpose of Encounter: CODE STATUS. Parties in Attendance: The patient, his daughter and me. Patient's Decisional Capacity: Yes. Medical Story: Michael [name]  is a 84 y.o. male with a known history of HTN, DM, CAD, HLP, GERD, carotid arter stenosis and prostate cancer with right percutaneous nephrostomy with a left ureteral stent and Foley catheter.  The patient has recurrent admission due to recurrent UTI with ESBL, which is related to process cancer with percutaneous nephrostomy.  I discussed with the patient and and his daughter about the patient's conditions including recurrent admission, regarding UTI due to prostate cancer, poor prognosis and CODE STATUS.  They want full code for now.  Plan:  Code Status: Full code. Time spent discussing advance care planning: 17 minutes.

## 2017-05-17 NOTE — H&P (Signed)
Michael [name] at [facility] NAME: Michael [name]    MR#:  [id number]  DATE OF BIRTH:  Jun 26, 1933  DATE OF ADMISSION:  05/17/2017  PRIMARY CARE PHYSICIAN: [name], MD   REQUESTING/REFERRING PHYSICIAN: Lavonia Drafts, MD  CHIEF COMPLAINT:   Chief Complaint  Patient presents with  . Urinary Tract Infection    HISTORY OF PRESENT ILLNESS:  Michael [name]  is a 84 y.o. male with a known history of HTN, DM, CAD, HLP, GERD, carotid arter stenosis and prostate cancer with right percutaneous nephrostomy with a left ureteral stent and Foley catheter.  The patient has recurrent UTI with ESBL.  He was just recently treated with IV antibiotics in the hospital and discharged with Macrobid.  He finished Macrobid yesterday but has had recurrent fever for the past few days.  Urologist suggested patient come to ED for further IV antibiotic treatment.  Urine analysis show too many WBC and RBC, yeast buds, and positive nitrate. PAST MEDICAL HISTORY:   Past Medical History:  Diagnosis Date  . Benign prostatic hypertrophy   . Bilateral carotid artery disease (HCC)    Mild plaque formation without obstructive disease noted on carotid Doppler.  . Coronary artery disease    Previous cardiac catheterization at [facility] in 2010. The patient was told about 2 blockages which did not require revascularization.  . Diabetes mellitus without complication ([place])   . Essential hypertension   . GERD (gastroesophageal reflux disease)   . Hyperlipidemia   . Hypertension   . Left carotid artery stenosis     PAST SURGICAL HISTORY:   Past Surgical History:  Procedure Laterality Date  . CARDIAC CATHETERIZATION  2010  . CARDIAC CATHETERIZATION N/A 02/03/2015   Procedure: Left Heart Cath and Coronary Angiography;  Surgeon: Wellington [name], MD;  Location: [place] [facility];  Service: Cardiovascular;  Laterality: N/A;  . CATARACT EXTRACTION    . CYSTOSCOPY W/ RETROGRADES  Bilateral 04/11/2017   Procedure: CYSTOSCOPY WITH RETROGRADE PYELOGRAM;  Surgeon: [name], MD;  Location: [facility];  Service: Urology;  Laterality: Bilateral;  . CYSTOSCOPY WITH STENT PLACEMENT Left 04/11/2017   Procedure: CYSTOSCOPY WITH STENT PLACEMENT and fulgeration;  Surgeon: [name], MD;  Location: [facility];  Service: Urology;  Laterality: Left;  . CYSTOSCOPY WITH URETHRAL DILATATION Bilateral 04/11/2017   Procedure: CYSTOSCOPY WITH URETHRAL DILATATION;  Surgeon: [name], MD;  Location: [facility];  Service: Urology;  Laterality: Bilateral;  . IR NEPHROSTOMY PLACEMENT RIGHT  04/13/2017  . PROSTATECTOMY      SOCIAL HISTORY:   Social History   Tobacco Use  . Smoking status: Never Smoker  . Smokeless tobacco: Never Used  Substance Use Topics  . Alcohol use: No    FAMILY HISTORY:   Family History  Problem Relation Age of Onset  . Hypertension Father     DRUG ALLERGIES:   Allergies  Allergen Reactions  . Zosyn [Piperacillin Sod-Tazobactam So] Swelling    Lips swollen.  Was given at hospital through IV    REVIEW OF SYSTEMS:   Review of Systems  Constitutional: Positive for chills, fever and malaise/fatigue.  HENT: Negative for sore throat.   Eyes: Negative for blurred vision and double vision.  Respiratory: Positive for cough. Negative for hemoptysis, shortness of breath, wheezing and stridor.   Cardiovascular: Negative for chest pain, palpitations, orthopnea and leg swelling.  Gastrointestinal: Negative for abdominal pain, blood in stool, diarrhea, melena, nausea and vomiting.  Genitourinary: Negative for  dysuria, flank pain and hematuria.  Musculoskeletal: Negative for back pain and joint pain.  Skin: Negative for rash.  Neurological: Negative for dizziness, sensory change, focal weakness, seizures, loss of consciousness, weakness and headaches.  Endo/Heme/Allergies: Negative for polydipsia.  Psychiatric/Behavioral: Negative for depression. The  patient is not nervous/anxious.     MEDICATIONS AT HOME:   Prior to Admission medications   Medication Sig Start Date End Date Taking? Authorizing Provider  acetaminophen (TYLENOL) 500 MG tablet Take 500 mg by mouth every 4 (four) hours as needed for moderate pain or fever.   Yes [provider]  albuterol (PROVENTIL HFA;VENTOLIN HFA) 108 (90 Base) MCG/ACT inhaler Inhale 2 puffs into the lungs every 6 (six) hours as needed for wheezing or shortness of breath. 04/16/17  Yes Dustin Flock, MD  atorvastatin (LIPITOR) 10 MG tablet Take 10 mg by mouth daily.   Yes [provider]  Calcium Carb-Cholecalciferol (CALCIUM 600+D3) 600-800 MG-UNIT TABS Take 1 tablet by mouth daily.   Yes [provider]  cetirizine (ZYRTEC) 10 MG tablet Take 1 tablet (10 mg total) by mouth daily. Patient taking differently: Take 10 mg by mouth daily as needed for allergies.  02/07/17  Yes Boscia, Greer Ee, NP  feeding supplement, GLUCERNA SHAKE, (GLUCERNA SHAKE) LIQD Take 237 mLs by mouth 3 (three) times daily between meals. 04/20/17  Yes Boscia, Greer Ee, NP  finasteride (PROSCAR) 5 MG tablet Take 5 mg by mouth daily.   Yes [provider]  glucose blood (ACCU-CHEK GUIDE) test strip Use asdirected three times a day diag E11.65 04/19/17  Yes [name], MD  ibuprofen (ADVIL,MOTRIN) 200 MG tablet Take 200 mg by mouth every 4 (four) hours as needed for fever or moderate pain.   Yes [provider]  insulin glargine (LANTUS) 100 UNIT/ML injection Inject 14-16 Units into the skin at bedtime. Based on sugar levels in am.   Yes [provider]  insulin lispro (HUMALOG) 100 UNIT/ML injection Inject 0-10 Units into the skin 3 (three) times daily before meals. <100 = none 100-140 = 0 141-180 = 3u 181-220 = 5-7u 221-260 = 6-9u 261-300 = 7u 301-340 = 8u 341+ = 10u  May be more or less administered depending upon glucose reading   Yes [provider]  Insulin  Syringe-Needle U-100 (B-D INS SYR ULTRAFINE 1CC/31G) 31G X 5/16" 1 ML MISC Use as directed 04/17/17  Yes Boscia, Heather E, NP  lisinopril (PRINIVIL,ZESTRIL) 2.5 MG tablet Take 2.5 mg by mouth daily. 03/14/17  Yes [provider]  metoprolol succinate (TOPROL-XL) 25 MG 24 hr tablet Take 25 mg by mouth daily.  12/22/14  Yes [provider]  Multiple Vitamins-Minerals (MULTIVITAMIN WITH MINERALS) tablet Take 1 tablet by mouth daily.    Yes [provider]  nitrofurantoin, macrocrystal-monohydrate, (MACROBID) 100 MG capsule Take 1 capsule (100 mg total) by mouth every 12 (twelve) hours. 05/09/17  Yes Fritzi Mandes, MD  omeprazole (PRILOSEC) 20 MG capsule Take 20 mg by mouth daily. 01/04/15  Yes [provider]  ondansetron (ZOFRAN) 8 MG tablet One pill every 8 hours as needed for nausea/vomitting. Patient taking differently: Take 8 mg by mouth every 8 (eight) hours as needed for nausea or vomiting.  05/05/17  Yes [name], MD  prochlorperazine (COMPAZINE) 10 MG tablet Take 1 tablet (10 mg total) by mouth every 6 (six) hours as needed for nausea or vomiting. 05/05/17  Yes [name], MD      VITAL SIGNS:  Blood pressure (!) 145/85, pulse 87, temperature 97.9 F (36.6 C), temperature source Oral, resp. rate 20, height 5\' 6"  (1.676 m), weight 127 lb (57.6 kg), SpO2 100 %.  PHYSICAL EXAMINATION:  Physical Exam  GENERAL:  84 y.o.-year-old patient lying in the bed with no acute distress.  EYES: Pupils equal, round, reactive to light and accommodation. No scleral icterus. Extraocular muscles intact.  HEENT: Head atraumatic, normocephalic. Oropharynx and nasopharynx clear.  NECK:  Supple, no jugular venous distention. No thyroid enlargement, no tenderness.  LUNGS: Normal breath sounds bilaterally, no wheezing, rales,rhonchi or crepitation. No use of accessory muscles of respiration.  CARDIOVASCULAR: S1, S2 normal. No murmurs, rubs, or gallops.    ABDOMEN: Soft, nontender, nondistended. Bowel sounds present. No organomegaly or mass.  EXTREMITIES: No pedal edema, cyanosis, or clubbing.  NEUROLOGIC: Cranial nerves II through XII are intact. Muscle strength 5/5 in all extremities. Sensation intact. Gait not checked.  PSYCHIATRIC: The patient is alert and oriented x 3.  SKIN: No obvious rash, lesion, or ulcer.   LABORATORY PANEL:   CBC Recent Labs  Lab 05/17/17 1054  WBC 10.1  HGB 9.3*  HCT 27.7*  PLT 405   ------------------------------------------------------------------------------------------------------------------  Chemistries  Recent Labs  Lab 05/17/17 1054  NA 133*  K 4.4  CL 102  CO2 21*  GLUCOSE 226*  BUN 21*  CREATININE 0.81  CALCIUM 7.7*  AST 29  ALT 33  ALKPHOS 859*  BILITOT 0.4   ------------------------------------------------------------------------------------------------------------------  Cardiac Enzymes No results for input(s): TROPONINI in the last 168 hours. ------------------------------------------------------------------------------------------------------------------  RADIOLOGY:  No results found.    IMPRESSION AND PLAN:   Recurrent UTI with ESBL. The patient will be admitted to medical floor. Start meropenem pharmacy to dose, give Diflucan since urinalysis shows yeast buds.  Follow-up urine culture.  IV fluids support.  Hyponatremia and mild dehydration.  Normal saline IV and follow-up BMP.  Hypertension.  Continue hypertension medication. Diabetes.  Continue Lantus and start sliding scale. Anemia of chronic disease.  Stable.   All the records are reviewed and case discussed with ED provider. Management plans discussed with the patient, his daughter and they are in agreement.  CODE STATUS: Full code  TOTAL TIME TAKING CARE OF THIS PATIENT: 53 minutes.    Demetrios Loll M.D on 05/17/2017 at 3:13 PM  Between 7am to 6pm - Pager - [phone]  After 6pm go to [email or web address]  - password [email or web address] [facility]  [facility]  Office  [phone]  CC: Primary care physician; [name], MD   Note: This dictation was prepared with Dragon dictation along with smaller phrase technology. Any transcriptional errors that result from this process are unin

## 2017-05-17 NOTE — Progress Notes (Signed)
Pharmacy Antibiotic Note  Michael [name] is a 84 y.o. male with a h/o recurrent ESBL UTI admitted on 05/17/2017 with UTI.  Pharmacy has been consulted for meropenem dosing.  Plan: Meropenem 1 g iv q 8 hours.   Height: 5\' 6"  (167.6 cm) Weight: 127 lb (57.6 kg) IBW/kg (Calculated) : 63.8  Temp (24hrs), Avg:97.9 F (36.6 C), Min:97.9 F (36.6 C), Max:97.9 F (36.6 C)  Recent Labs  Lab 05/17/17 1054  WBC 10.1  CREATININE 0.81  LATICACIDVEN 1.6    Estimated Creatinine Clearance: 58.3 mL/min (by C-G formula based on SCr of 0.81 mg/dL).    Allergies  Allergen Reactions  . Zosyn [Piperacillin Sod-Tazobactam So] Swelling    Lips swollen.  Was given at hospital through IV    Antimicrobials this admission: Cefepime 3/28 x 1 meropenem 3/28 >>   Dose adjustments this admission:   Microbiology results: 3/28 UCx: sent   Thank you for allowing pharmacy to be a part of this patient's care.  Napoleon Form 05/17/2017 4:42 PM

## 2017-05-17 NOTE — Care Management (Signed)
Patient active with [facility] SN, PT and SW.

## 2017-05-17 NOTE — ED Provider Notes (Addendum)
[name]   ____________________________________________    I have reviewed the triage vital signs and the nursing notes.   HISTORY  Chief Complaint Urinary Tract Infection     HPI Michael [name] is a 84 y.o. male with a history of prostate cancer, diabetes who has a right percutaneous nephrostomy with a left ureteral stent and Foley catheter admitted by me to the hospital 10 days ago for urinary tract infection which was found to be ESBL.  Treated with IV antibiotics while in the hospital and discharged on p.o. antibiotics, daughter reports the patient has had elevated temperatures the last several days as high as 100.2 and 100.3.  Has been compliant with antibiotics.   Past Medical History:  Diagnosis Date  . Benign prostatic hypertrophy   . Bilateral carotid artery disease (HCC)    Mild plaque formation without obstructive disease noted on carotid Doppler.  . Coronary artery disease    Previous cardiac catheterization at [facility] in 2010. The patient was told about 2 blockages which did not require revascularization.  . Diabetes mellitus without complication ([place])   . Essential hypertension   . GERD (gastroesophageal reflux disease)   . Hyperlipidemia   . Hypertension   . Left carotid artery stenosis     Patient Active Problem List   Diagnosis Date Noted  . UTI due to extended-spectrum beta lactamase (ESBL) producing Escherichia coli 05/17/2017  . Protein-calorie malnutrition, severe 05/09/2017  . Acute febrile illness 05/07/2017  . Goals of care, counseling/discussion 04/21/2017  . Prostate cancer ([place])   . Retroperitoneal lymphadenopathy   . Hydronephrosis of right kidney   . Abdominal pain 04/11/2017  . Type II diabetes mellitus, uncontrolled ([place]) 04/05/2017  . Coronary artery disease involving native coronary artery of native heart without angina pectoris 03/15/2015  . Coronary artery disease  involving native coronary artery with angina pectoris ([place]) 01/22/2015  . Essential hypertension   . Hyperlipidemia   . Spermatocele 03/26/2012  . Benign localized prostatic hyperplasia with lower urinary tract symptoms (LUTS) 03/22/2012  . Candidiasis of urogenital sites 03/22/2012  . ED (erectile dysfunction) of organic origin 03/22/2012  . Elevated prostate specific antigen (PSA) 03/22/2012  . Incomplete emptying of bladder 03/22/2012  . Redundant prepuce and phimosis 03/22/2012  . Urge incontinence 03/22/2012    Past Surgical History:  Procedure Laterality Date  . CARDIAC CATHETERIZATION  2010  . CARDIAC CATHETERIZATION N/A 02/03/2015   Procedure: Left Heart Cath and Coronary Angiography;  Surgeon: Wellington [name], MD;  Location: [facility];  Service: Cardiovascular;  Laterality: N/A;  . CATARACT EXTRACTION    . CYSTOSCOPY W/ RETROGRADES Bilateral 04/11/2017   Procedure: CYSTOSCOPY WITH RETROGRADE PYELOGRAM;  Surgeon: [name], MD;  Location: [facility] ORS;  Service: Urology;  Laterality: Bilateral;  . CYSTOSCOPY WITH STENT PLACEMENT Left 04/11/2017   Procedure: CYSTOSCOPY WITH STENT PLACEMENT and fulgeration;  Surgeon: [name], MD;  Location: [facility] ORS;  Service: Urology;  Laterality: Left;  . CYSTOSCOPY WITH URETHRAL DILATATION Bilateral 04/11/2017   Procedure: CYSTOSCOPY WITH URETHRAL DILATATION;  Surgeon: [name], MD;  Location: [facility] ORS;  Service: Urology;  Laterality: Bilateral;  . IR NEPHROSTOMY PLACEMENT RIGHT  04/13/2017  . PROSTATECTOMY      Prior to Admission medications   Medication Sig Start Date End Date Taking? Authorizing Provider  acetaminophen (TYLENOL) 500 MG tablet Take 500 mg by mouth every 4 (four) hours as needed for moderate pain or fever.  Yes [provider]  albuterol (PROVENTIL HFA;VENTOLIN HFA) 108 (90 Base) MCG/ACT inhaler Inhale 2 puffs into the lungs every 6 (six) hours as needed for wheezing or shortness of breath.  04/16/17  Yes Dustin Flock, MD  atorvastatin (LIPITOR) 10 MG tablet Take 10 mg by mouth daily.   Yes [provider]  Calcium Carb-Cholecalciferol (CALCIUM 600+D3) 600-800 MG-UNIT TABS Take 1 tablet by mouth daily.   Yes [provider]  cetirizine (ZYRTEC) 10 MG tablet Take 1 tablet (10 mg total) by mouth daily. Patient taking differently: Take 10 mg by mouth daily as needed for allergies.  02/07/17  Yes Boscia, Greer Ee, NP  feeding supplement, GLUCERNA SHAKE, (GLUCERNA SHAKE) LIQD Take 237 mLs by mouth 3 (three) times daily between meals. 04/20/17  Yes Boscia, Greer Ee, NP  finasteride (PROSCAR) 5 MG tablet Take 5 mg by mouth daily.   Yes [provider]  glucose blood (ACCU-CHEK GUIDE) test strip Use asdirected three times a day diag E11.65 04/19/17  Yes [name], MD  ibuprofen (ADVIL,MOTRIN) 200 MG tablet Take 200 mg by mouth every 4 (four) hours as needed for fever or moderate pain.   Yes [provider]  insulin glargine (LANTUS) 100 UNIT/ML injection Inject 14-16 Units into the skin at bedtime. Based on sugar levels in am.   Yes [provider]  insulin lispro (HUMALOG) 100 UNIT/ML injection Inject 0-10 Units into the skin 3 (three) times daily before meals. <100 = none 100-140 = 0 141-180 = 3u 181-220 = 5-7u 221-260 = 6-9u 261-300 = 7u 301-340 = 8u 341+ = 10u  May be more or less administered depending upon glucose reading   Yes [provider]  Insulin Syringe-Needle U-100 (B-D INS SYR ULTRAFINE 1CC/31G) 31G X 5/16" 1 ML MISC Use as directed 04/17/17  Yes Boscia, Heather E, NP  lisinopril (PRINIVIL,ZESTRIL) 2.5 MG tablet Take 2.5 mg by mouth daily. 03/14/17  Yes [provider]  metoprolol succinate (TOPROL-XL) 25 MG 24 hr tablet Take 25 mg by mouth daily.  12/22/14  Yes [provider]  Multiple Vitamins-Minerals (MULTIVITAMIN WITH MINERALS) tablet Take 1 tablet by mouth daily.    Yes [provider]   nitrofurantoin, macrocrystal-monohydrate, (MACROBID) 100 MG capsule Take 1 capsule (100 mg total) by mouth every 12 (twelve) hours. 05/09/17  Yes Fritzi Mandes, MD  omeprazole (PRILOSEC) 20 MG capsule Take 20 mg by mouth daily. 01/04/15  Yes [provider]  ondansetron (ZOFRAN) 8 MG tablet One pill every 8 hours as needed for nausea/vomitting. Patient taking differently: Take 8 mg by mouth every 8 (eight) hours as needed for nausea or vomiting.  05/05/17  Yes [name], MD  prochlorperazine (COMPAZINE) 10 MG tablet Take 1 tablet (10 mg total) by mouth every 6 (six) hours as needed for nausea or vomiting. 05/05/17  Yes [name], MD  enzalutamide Gillermina Phy) 40 MG capsule Take 4 capsules (160 mg total) by mouth daily. 05/18/17   [name], MD     Allergies Zosyn [piperacillin sod-tazobactam so]  Family History  Problem Relation Age of Onset  . Hypertension Father     Social History Social History   Tobacco Use  . Smoking status: Never Smoker  . Smokeless tobacco: Never Used  Substance Use Topics  . Alcohol use: No  . Drug use: No    Review of Systems  Constitutional: Temperature is as above Eyes: No visual changes.  ENT: No sore throat. Cardiovascular: Denies  chest pain. Respiratory: Denies shortness of breath. Gastrointestinal:  No nausea, no vomiting.   Genitourinary: Foley catheter and nephrostomy Musculoskeletal: Negative for back pain. Skin: Negative for rash. Neurological: Negative for headaches   ____________________________________________   PHYSICAL EXAM:  VITAL SIGNS: ED Triage Vitals  Enc Vitals Group     BP 05/17/17 1035 (!) 152/91     Pulse Rate 05/17/17 1035 98     Resp 05/17/17 1035 (!) 28     Temp 05/17/17 1035 97.9 F (36.6 C)     Temp Source 05/17/17 1035 Oral     SpO2 05/17/17 1035 100 %     Weight 05/17/17 1036 57.6 kg (127 lb)     Height 05/17/17 1036 1.676 m ('5\' 6"' )     Head Circumference --       Peak Flow --      Pain Score 05/17/17 1035 0     Pain Loc --      Pain Edu? --      Excl. in [place]? --     Constitutional: Alert and oriented. No acute distress.  Eyes: Conjunctivae are normal.   Nose: No congestion/rhinnorhea. Mouth/Throat: Mucous membranes are moist.   Neck:  Painless ROM Cardiovascular: Normal rate, regular rhythm. Grossly normal heart sounds.  Good peripheral circulation. Respiratory: Normal respiratory effort.  No retractions. Lungs CTAB. Gastrointestinal: Soft and nontender. No distention.  No CVA tenderness. Genitourinary: Right nephrostomy, catheter in place, Foley catheter noted yellow urine Musculoskeletal:   Warm and well perfused Neurologic:  Normal speech and language. No gross focal neurologic deficits are appreciated.  Skin:  Skin is warm, dry and intact. No rash noted. Psychiatric: Mood and affect are normal. Speech and behavior are normal.  ____________________________________________   LABS (all labs ordered are listed, but only abnormal results are displayed)  Labs Reviewed  URINE CULTURE - Abnormal; Notable for the following components:      Result Value   Culture   (*)    Value: >=100,000 COLONIES/mL PSEUDOMONAS AERUGINOSA SUSCEPTIBILITIES TO FOLLOW Performed at [facility], [place] [address] [place], [place] [address]    All other components within normal limits  CBC - Abnormal; Notable for the following components:   RBC 3.28 (*)    Hemoglobin 9.3 (*)    HCT 27.7 (*)    RDW 17.1 (*)    All other components within normal limits  COMPREHENSIVE METABOLIC PANEL - Abnormal; Notable for the following components:   Sodium 133 (*)    CO2 21 (*)    Glucose, Bld 226 (*)    BUN 21 (*)    Calcium 7.7 (*)    Albumin 2.8 (*)    Alkaline Phosphatase 859 (*)    All other components within normal limits  URINALYSIS, COMPLETE (UACMP) WITH MICROSCOPIC - Abnormal; Notable for the following components:   Color, Urine STRAW (*)    APPearance  CLEAR (*)    Specific Gravity, Urine 1.003 (*)    Hgb urine dipstick LARGE (*)    Leukocytes, UA LARGE (*)    All other components within normal limits  URINALYSIS, COMPLETE (UACMP) WITH MICROSCOPIC - Abnormal; Notable for the following components:   Color, Urine YELLOW (*)    APPearance HAZY (*)    Hgb urine dipstick SMALL (*)    Protein, ur 30 (*)    Nitrite POSITIVE (*)    Leukocytes, UA LARGE (*)    Squamous Epithelial / LPF 0-5 (*)    All other  components within normal limits  GLUCOSE, CAPILLARY - Abnormal; Notable for the following components:   Glucose-Capillary 219 (*)    All other components within normal limits  BASIC METABOLIC PANEL - Abnormal; Notable for the following components:   Calcium 7.7 (*)    All other components within normal limits  CBC - Abnormal; Notable for the following components:   RBC 3.32 (*)    Hemoglobin 9.1 (*)    HCT 27.8 (*)    RDW 16.9 (*)    All other components within normal limits  GLUCOSE, CAPILLARY - Abnormal; Notable for the following components:   Glucose-Capillary 294 (*)    All other components within normal limits  GLUCOSE, CAPILLARY - Abnormal; Notable for the following components:   Glucose-Capillary 268 (*)    All other components within normal limits  GLUCOSE, CAPILLARY - Abnormal; Notable for the following components:   Glucose-Capillary 245 (*)    All other components within normal limits  GLUCOSE, CAPILLARY - Abnormal; Notable for the following components:   Glucose-Capillary 281 (*)    All other components within normal limits  CULTURE, BLOOD (ROUTINE X 2)  CULTURE, BLOOD (ROUTINE X 2)  URINE CULTURE  URINE CULTURE  LACTIC ACID, PLASMA  GLUCOSE, CAPILLARY   ____________________________________________  EKG  ED ECG REPORT I, Lavonia Drafts, the attending physician, personally viewed and interpreted this ECG.  Date: 05/17/2017 EKG Time: 10:33 AM Rate: 98 Rhythm: normal sinus rhythm QRS Axis: normal Intervals:  normal ST/T Wave abnormalities: normal   ____________________________________________  RADIOLOGY  None ____________________________________________   PROCEDURES  Procedure(s) performed: No  Procedures   Critical Care performed: No ____________________________________________   INITIAL IMPRESSION / ASSESSMENT AND PLAN / ED COURSE  Pertinent labs & imaging results that were available during my care of the patient were reviewed by me and considered in my medical decision making (see chart for details).  Patient with history of ESBL UTI with nephrostomy and Foley catheter presents today with elevated temperatures despite compliance with outpatient antibiotics.  Lab work is overall reassuring, elevated alk phos may be related to acute phase reactant or secondary to prostate cancer however urine still appears grossly infected and given his elevated heart rate, reports of elevated temperatures at home I feel the patient will do better with IV antibiotics  Clinical Course as of May 19 2306  Thu May 17, 2017  1421 Urine Culture [RK]    Clinical Course User Index [RK] Lavonia Drafts, MD   ____________________________________________   FINAL CLINICAL IMPRESSION(S) / ED DIAGNOSES  Final diagnoses:  Infection and inflammatory reaction due to nephrostomy catheter, initial encounter [facility])        Note:  This document was prepared using Dragon voice recognition software and may include unintentional dictation errors.    Lavonia Drafts, MD 05/17/17 1417    Lavonia Drafts, MD 05/18/17 2308

## 2017-05-18 ENCOUNTER — Telehealth: Payer: Self-pay

## 2017-05-18 ENCOUNTER — Telehealth: Payer: Self-pay | Admitting: Internal Medicine

## 2017-05-18 ENCOUNTER — Other Ambulatory Visit: Payer: Self-pay | Admitting: Internal Medicine

## 2017-05-18 ENCOUNTER — Telehealth: Payer: Self-pay | Admitting: Pharmacist

## 2017-05-18 DIAGNOSIS — I2510 Atherosclerotic heart disease of native coronary artery without angina pectoris: Secondary | ICD-10-CM

## 2017-05-18 DIAGNOSIS — I10 Essential (primary) hypertension: Secondary | ICD-10-CM

## 2017-05-18 DIAGNOSIS — E785 Hyperlipidemia, unspecified: Secondary | ICD-10-CM

## 2017-05-18 DIAGNOSIS — E1165 Type 2 diabetes mellitus with hyperglycemia: Secondary | ICD-10-CM

## 2017-05-18 DIAGNOSIS — N390 Urinary tract infection, site not specified: Secondary | ICD-10-CM

## 2017-05-18 DIAGNOSIS — I350 Nonrheumatic aortic (valve) stenosis: Secondary | ICD-10-CM

## 2017-05-18 DIAGNOSIS — K219 Gastro-esophageal reflux disease without esophagitis: Secondary | ICD-10-CM

## 2017-05-18 DIAGNOSIS — C61 Malignant neoplasm of prostate: Secondary | ICD-10-CM

## 2017-05-18 DIAGNOSIS — N135 Crossing vessel and stricture of ureter without hydronephrosis: Secondary | ICD-10-CM

## 2017-05-18 DIAGNOSIS — Z79899 Other long term (current) drug therapy: Secondary | ICD-10-CM

## 2017-05-18 DIAGNOSIS — Z794 Long term (current) use of insulin: Secondary | ICD-10-CM

## 2017-05-18 LAB — CBC
HCT: 27.8 % — ABNORMAL LOW (ref 40.0–52.0)
Hemoglobin: 9.1 g/dL — ABNORMAL LOW (ref 13.0–18.0)
MCH: 27.5 pg (ref 26.0–34.0)
MCHC: 32.8 g/dL (ref 32.0–36.0)
MCV: 83.7 fL (ref 80.0–100.0)
Platelets: 401 10*3/uL (ref 150–440)
RBC: 3.32 MIL/uL — ABNORMAL LOW (ref 4.40–5.90)
RDW: 16.9 % — ABNORMAL HIGH (ref 11.5–14.5)
WBC: 8.4 10*3/uL (ref 3.8–10.6)

## 2017-05-18 LAB — BASIC METABOLIC PANEL
Anion gap: 9 (ref 5–15)
BUN: 16 mg/dL (ref 6–20)
CO2: 22 mmol/L (ref 22–32)
Calcium: 7.7 mg/dL — ABNORMAL LOW (ref 8.9–10.3)
Chloride: 104 mmol/L (ref 101–111)
Creatinine, Ser: 0.77 mg/dL (ref 0.61–1.24)
GFR calc Af Amer: >60 mL/min (ref >60)
GFR calc non Af Amer: >60 mL/min (ref >60)
Glucose, Bld: 89 mg/dL (ref 65–99)
Potassium: 4.1 mmol/L (ref 3.5–5.1)
Sodium: 135 mmol/L (ref 135–145)

## 2017-05-18 LAB — GLUCOSE, CAPILLARY
Glucose-Capillary: 93 mg/dL (ref 65–99)
Glucose-Capillary: 268 mg/dL — ABNORMAL HIGH (ref 65–99)
Glucose-Capillary: 245 mg/dL — ABNORMAL HIGH (ref 65–99)
Glucose-Capillary: 281 mg/dL — ABNORMAL HIGH (ref 65–99)

## 2017-05-18 MED ORDER — ENZALUTAMIDE 40 MG PO CAPS: 160.0000 mg | ORAL_CAPSULE | Freq: Every day | ORAL | 0 refills | Status: DC

## 2017-05-18 MED FILL — XTANDI 40 MG CAPSULE: 40 | 30 days supply | Qty: 120 | Fill #0

## 2017-05-18 NOTE — Telephone Encounter (Signed)
Michael [name] from [facility] called requesting Nursing 2 x week for 2 weeks, then 1 x week til April 27th, 19.  Also approved them to be able to take orders from the urologist Dr Karna Dupes  Pt was discharge from hospital on 05/09/17.  Pt refused home health because they feel they don't need at this time.  dbs

## 2017-05-18 NOTE — Telephone Encounter (Signed)
Oral Oncology Pharmacist Encounter  Received new prescription for Xtandi (enzaluatimde) for the treatment of metastatic prostate cancer, planned duration until disease progression or unacceptable drug toxicity.  BP from 05/11/17 assessed, BP is well controlled. Prescription dose and frequency assessed.   Current medication list in Epic reviewed, a few DDIs with enzalutamide identified: -Enzalutamide may decrease the concentrations of atorvastatin, omeprazole, and ondansetron. Patient should be monitored for decreased effectiveness of those medications. No dose adjustment needed for this interactions at this time. Mr. Giammarco is inpatient now and should if his medication list is changed at discharge, DDIs should be reevaluated.   Prescription has been e-scribed to the [employer] for benefits analysis and approval.  Oral Oncology Clinic will continue to follow for insurance authorization, copayment issues, initial counseling and start date.  Darl Pikes, PharmD, BCPS Hematology/Oncology Clinical Pharmacist [facility] [phone]  05/18/2017 10:16 AM

## 2017-05-18 NOTE — Progress Notes (Signed)
Pharmacy Antibiotic Note  Michael [name] is a 84 y.o. male with a h/o recurrent ESBL UTI admitted on 05/17/2017 with UTI.  Pharmacy has been consulted for meropenem dosing. Patient recently finished a course of nitrofurantoin.  Plan: Continue meropenem 1 g IV q 8 hours  Height: 5\' 6"  (167.6 cm) Weight: 127 lb (57.6 kg) IBW/kg (Calculated) : 63.8  Temp (24hrs), Avg:97.9 F (36.6 C), Min:97.5 F (36.4 C), Max:98.2 F (36.8 C)  Recent Labs  Lab 05/17/17 1054 05/18/17 0334  WBC 10.1 8.4  CREATININE 0.81 0.77  LATICACIDVEN 1.6  --     Estimated Creatinine Clearance: 59 mL/min (by C-G formula based on SCr of 0.77 mg/dL).    Allergies  Allergen Reactions  . Zosyn [Piperacillin Sod-Tazobactam So] Swelling    Lips swollen.  Was given at hospital through IV    Antimicrobials this admission: Cefepime 3/28 x 1 meropenem 3/28 >>   Dose adjustments this admission:  Microbiology results: 3/28 UCx: sent 3/28 Blood: Pending, no growth < 12 hrs   Thank you for allowing pharmacy to be a part of this patient's care.  Lenis Noon, PharmD, BCPS Clinical Pharmacist 05/18/2017 9:07 AM

## 2017-05-18 NOTE — Consult Note (Signed)
[place] NOTE  Patient Care Team: [name], MD as PCP - General (Internal Medicine) Dannielle Karvonen, RN as [place] Management  CHIEF COMPLAINTS/PURPOSE OF CONSULTATION:  Metastatic prostate cancer  HISTORY OF PRESENTING ILLNESS:  Michael [name] 84 y.o.  male history of newly diagnosed prostate adenocarcinoma metastatic [poor PSA secretory-PSA around 9]; and also has right-sided percutaneous nephrostomy-is currently admitted the hospital for UTI/fever.  Patient was recently discharged from the hospital after being treated for UTI. Patient's chemotherapy was held last week-because of the recent admission to hospital/UTI.  Yesterday, patient was finishing up his course of Macrobid when he was noted to have a fever at home.  Was sent over to the ER for further evaluation/management.  Urine suggestive of UTI/yeast. Patient is currently on antibiotics.  Patient has not had any fevers in the hospital.  Appetite is fair.  No nausea no vomiting.  No back pain.  No blood in stools black colored stools or constipation.  At home he has been more mobile; up and about.  ROS: A complete 10 point review of system is done which is negative except mentioned above in history of present illness  MEDICAL HISTORY:  Past Medical History:  Diagnosis Date  . Benign prostatic hypertrophy   . Bilateral carotid artery disease (HCC)    Mild plaque formation without obstructive disease noted on carotid Doppler.  . Coronary artery disease    Previous cardiac catheterization at [facility] in 2010. The patient was told about 2 blockages which did not require revascularization.  . Diabetes mellitus without complication ([place])   . Essential hypertension   . GERD (gastroesophageal reflux disease)   . Hyperlipidemia   . Hypertension   . Left carotid artery stenosis     SURGICAL HISTORY: Past Surgical History:  Procedure Laterality Date  . CARDIAC CATHETERIZATION   2010  . CARDIAC CATHETERIZATION N/A 02/03/2015   Procedure: Left Heart Cath and Coronary Angiography;  Surgeon: Wellington [name], MD;  Location: [place] CV [facility];  Service: Cardiovascular;  Laterality: N/A;  . CATARACT EXTRACTION    . CYSTOSCOPY W/ RETROGRADES Bilateral 04/11/2017   Procedure: CYSTOSCOPY WITH RETROGRADE PYELOGRAM;  Surgeon: [name], MD;  Location: [facility] ORS;  Service: Urology;  Laterality: Bilateral;  . CYSTOSCOPY WITH STENT PLACEMENT Left 04/11/2017   Procedure: CYSTOSCOPY WITH STENT PLACEMENT and fulgeration;  Surgeon: [name], MD;  Location: [facility] ORS;  Service: Urology;  Laterality: Left;  . CYSTOSCOPY WITH URETHRAL DILATATION Bilateral 04/11/2017   Procedure: CYSTOSCOPY WITH URETHRAL DILATATION;  Surgeon: [name], MD;  Location: [facility] ORS;  Service: Urology;  Laterality: Bilateral;  . IR NEPHROSTOMY PLACEMENT RIGHT  04/13/2017  . PROSTATECTOMY      SOCIAL HISTORY: Social History   Socioeconomic History  . Marital status: Widowed    Spouse name: Not on file  . Number of children: Not on file  . Years of education: Not on file  . Highest education level: Not on file  Occupational History  . Not on file  Social Needs  . Financial resource strain: Not on file  . Food insecurity:    Worry: Not on file    Inability: Not on file  . Transportation needs:    Medical: Not on file    Non-medical: Not on file  Tobacco Use  . Smoking status: Never Smoker  . Smokeless tobacco: Never Used  Substance and Sexual Activity  . Alcohol use: No  . Drug use: No  .  Sexual activity: Not on file  Lifestyle  . Physical activity:    Days per week: Not on file    Minutes per session: Not on file  . Stress: Not on file  Relationships  . Social connections:    Talks on phone: Not on file    Gets together: Not on file    Attends religious service: Not on file    Active member of club or organization: Not on file    Attends meetings of clubs or  organizations: Not on file    Relationship status: Not on file  . Intimate partner violence:    Fear of current or ex partner: Not on file    Emotionally abused: Not on file    Physically abused: Not on file    Forced sexual activity: Not on file  Other Topics Concern  . Not on file  Social History Narrative  . Not on file    FAMILY HISTORY: Family History  Problem Relation Age of Onset  . Hypertension Father     ALLERGIES:  is allergic to zosyn [piperacillin sod-tazobactam so].  MEDICATIONS:  Current Facility-Administered Medications  Medication Dose Route Frequency Provider Last Rate Last Dose  . acetaminophen (TYLENOL) tablet 650 mg  650 mg Oral Q6H PRN Demetrios Loll, MD       Or  . acetaminophen (TYLENOL) suppository 650 mg  650 mg Rectal Q6H PRN Demetrios Loll, MD      . albuterol (PROVENTIL) (2.5 MG/3ML) 0.083% nebulizer solution 2.5 mg  2.5 mg Nebulization Q2H PRN Demetrios Loll, MD      . atorvastatin (LIPITOR) tablet 10 mg  10 mg Oral Daily Demetrios Loll, MD   10 mg at 05/18/17 1024  . bisacodyl (DULCOLAX) EC tablet 5 mg  5 mg Oral Daily PRN Demetrios Loll, MD      . calcium-vitamin D (OSCAL WITH D) 500-200 MG-UNIT per tablet 2 tablet  2 tablet Oral Q breakfast Demetrios Loll, MD   2 tablet at 05/18/17 1025  . enoxaparin (LOVENOX) injection 40 mg  40 mg Subcutaneous Q24H Demetrios Loll, MD   40 mg at 05/17/17 2149  . feeding supplement (GLUCERNA SHAKE) (GLUCERNA SHAKE) liquid 237 mL  237 mL Oral TID BM Demetrios Loll, MD   237 mL at 05/18/17 1521  . finasteride (PROSCAR) tablet 5 mg  5 mg Oral Daily Demetrios Loll, MD   5 mg at 05/18/17 1025  . fluconazole (DIFLUCAN) IVPB 100 mg  100 mg Intravenous Q24H Demetrios Loll, MD   Stopped at 05/17/17 2249  . HYDROcodone-acetaminophen (NORCO/VICODIN) 5-325 MG per tablet 1-2 tablet  1-2 tablet Oral Q4H PRN Demetrios Loll, MD      . insulin aspart (novoLOG) injection 0-5 Units  0-5 Units Subcutaneous QHS Demetrios Loll, MD   3 Units at 05/17/17 2149  . insulin aspart  (novoLOG) injection 0-9 Units  0-9 Units Subcutaneous TID WC Demetrios Loll, MD   5 Units at 05/18/17 1240  . insulin glargine (LANTUS) injection 14 Units  14 Units Subcutaneous QHS Demetrios Loll, MD   14 Units at 05/17/17 2252  . lisinopril (PRINIVIL,ZESTRIL) tablet 2.5 mg  2.5 mg Oral Daily Demetrios Loll, MD   2.5 mg at 05/18/17 1024  . meropenem (MERREM) 1 g in sodium chloride 0.9 % 100 mL IVPB  1 g Intravenous Q8H Demetrios Loll, MD 200 mL/hr at 05/18/17 1445 1 g at 05/18/17 1445  . metoprolol succinate (TOPROL-XL) 24 hr tablet 25 mg  25 mg Oral Daily  Demetrios Loll, MD   25 mg at 05/18/17 1025  . multivitamin with minerals tablet 1 tablet  1 tablet Oral Daily Demetrios Loll, MD   1 tablet at 05/18/17 1024  . ondansetron (ZOFRAN) tablet 4 mg  4 mg Oral Q6H PRN Demetrios Loll, MD       Or  . ondansetron [facility]) injection 4 mg  4 mg Intravenous Q6H PRN Demetrios Loll, MD      . pantoprazole (PROTONIX) EC tablet 40 mg  40 mg Oral Daily Demetrios Loll, MD   40 mg at 05/18/17 1023  . senna-docusate (Senokot-S) tablet 1 tablet  1 tablet Oral QHS PRN Demetrios Loll, MD          .  PHYSICAL EXAMINATION:  Vitals:   05/18/17 0838 05/18/17 1203  BP: 122/84 123/74  Pulse: (!) 102 88  Resp: 18   Temp: 97.6 F (36.4 C) 97.9 F (36.6 C)  SpO2: 99% 98%   Filed Weights   05/17/17 1036  Weight: 127 lb (57.6 kg)    GENERAL: Well-nourished well-developed; Alert, no distress and comfortable.   Accompanied by his daughter. EYES: no pallor or icterus OROPHARYNX: no thrush or ulceration. NECK: supple, no masses felt LYMPH:  no palpable lymphadenopathy in the cervical, axillary or inguinal regions LUNGS: decreased breath sounds to auscultation at bases and  No wheeze or crackles HEART/CVS: regular rate & rhythm and no murmurs; No lower extremity edema ABDOMEN: abdomen soft, non-tender and normal bowel sounds; right-sided nephrostomy tube in place. Musculoskeletal:no cyanosis of digits and no clubbing  PSYCH: alert & oriented x 3  with fluent speech NEURO: no focal motor/sensory deficits SKIN:  no rashes or significant lesions  LABORATORY DATA:  I have reviewed the data as listed Lab Results  Component Value Date   WBC 8.4 05/18/2017   HGB 9.1 (L) 05/18/2017   HCT 27.8 (L) 05/18/2017   MCV 83.7 05/18/2017   PLT 401 05/18/2017   Recent Labs    05/04/17 0938 05/07/17 0740  05/09/17 0538 05/17/17 1054 05/18/17 0334  NA 126* 124*   < > 134* 133* 135  K 4.7 4.0   < > 4.4 4.4 4.1  CL 96* 95*   < > 104 102 104  CO2 22 20*   < > 23 21* 22  GLUCOSE 268* 274*   < > 163* 226* 89  BUN 21* 22*   < > 16 21* 16  CREATININE 0.82 1.00   < > 0.73 0.81 0.77  CALCIUM 8.0* 7.6*   < > 7.7* 7.7* 7.7*  GFRNONAA >60 >60   < > >60 >60 >60  GFRAA >60 >60   < > >60 >60 >60  PROT 7.7 7.9  --   --  7.6  --   ALBUMIN 3.2* 3.0*  --   --  2.8*  --   AST 24 30  --   --  29  --   ALT 25 29  --   --  33  --   ALKPHOS 465* 522*  --   --  859*  --   BILITOT 0.4 0.5  --   --  0.4  --    < > = values in this interval not displayed.    RADIOGRAPHIC STUDIES: I have personally reviewed the radiological images as listed and agreed with the findings in the report. Dg Chest 2 View  Result Date: 05/07/2017 CLINICAL DATA:  Cough.  Prostate cancer. EXAM: CHEST - 2 VIEW COMPARISON:  04/14/2017 FINDINGS: COPD with hyperinflation of the lungs. Negative for infiltrate effusion or mass. No lung nodule or adenopathy in the chest Right nephrostomy.  Left ureteral stent. No skeletal lesions identified IMPRESSION: COPD without acute abnormality. Electronically Signed   By: Franchot Gallo M.D.   On: 05/07/2017 08:33   Nm Pet (axumin) Skull Base To Mid Thigh  Result Date: 04/26/2017 CLINICAL DATA:  Prostate carcinoma with biochemical recurrence. Recent diagnosis of prostate cancer. RIGHT nephrostomy tube. History of prostate surgery. EXAM: NUCLEAR MEDICINE PET SKULL BASE TO THIGH TECHNIQUE: 9.2 mCi F-18 Fluciclovine was injected intravenously. Full-ring  PET imaging was performed from the skull base to thigh after the radiotracer. CT data was obtained and used for attenuation correction and anatomic localization. COMPARISON:  CT 04/11/2017 FINDINGS: NECK No radiotracer activity in neck lymph nodes. CHEST No radiotracer accumulation within mediastinal or hilar lymph nodes. No suspicious pulmonary nodules on the CT scan. ABDOMEN/PELVIS: Moderate diffuse radiotracer activity within the prostate gland without focality (SUV max equal 5.1). Foley catheter extends through the prostate gland into the bladder. Intense radiotracer activity accumulates within the enlarged iliac lymph nodes. For example 2.7 cm short axis LEFT operator/iliac node with SUV max equal 7.4. Similar RIGHT external iliac lymph node with SUV max equal 7.8. Adenopathy extends along the iliac chain to the aortic bifurcation. Lymph node at the bifurcation with SUV max equal 6.1. Intense radiotracer activity associated with LEFT periaortic lymph nodes. Example cluster of lymph nodes LEFT aorta at the level of the LEFT kidney with SUV max equal 7.0. The most superior metastatic lymph node is at the LEFT renal vein. No focal abnormal activity within the liver. Physiologic activity noted within the pancreas and liver. Percutaneous nephrostomy tube on the RIGHT. Double-J ureteral stent on the LEFT. SKELETON There is diffuse radiotracer activity throughout the skeleton with some heterogeneity of signal intensity. There is diffuse sclerosis of the bones in the spine and pelvis. No discrete lesions identified by CT or PET data set. IMPRESSION: 1. Bulky bilateral iliac and periaortic lymphadenopathy with intense radiotracer activity consists with prostate cancer metastasis. The highest metastatic lymph nodes at the level of the LEFT renal vein in the periaortic retroperitoneum 2. Mild diffuse activity in the prostate gland is non-specific. 3. No additional soft tissue/visceral metastasis. 4. Diffuse radiotracer  activity within the marrow space as well as diffuse sclerosis. Differential would include metabolic disorder, hyperplasia from anemia or diffuse prostate cancer metastasis. MRI may or may not be helpful. Consider biopsy if clinically relevant. Electronically Signed   By: Suzy Bouchard M.D.   On: 04/26/2017 16:38    ASSESSMENT & PLAN:   # 84 year old male patient history of prostate cancer metastatic currently to the hospital for UTI  # Metastatic prostate cancer with involvement of the rectum urinary bladder-castrate sensitive.  Ideally would recommend docetaxel chemotherapy-for castrate sensitive/bulky newly diagnosed prostate cancer.  However, given frequent infections/admission the hospital; need for steroids with chemotherapy/docetaxel-I would recommend holding chemotherapy at this time.   # I would recommend starting the patient on novel antiandrogen therapy; in addition to degarelix. Given poor blood sugar control-I would not recommend Zytiga along with prednisone.  I would recommend start the patient on Xtandi-160 mg once a day.  His daughter understands that chemotherapy would be a possibility down the line.  #Poorly controlled diabetes-on insulin; currently better controlled-we will avoid prednisone/Zytiga.  #UTI-nephrostomy-ESBL/yeast-currently on antibiotics/Diflucan; defer to hospitalist service for further management.  #Right-sided ureteral obstruction secondary to pelvic adenopathy/malignancy-status post nephrostomy;  as per the family patient is awaiting repeat evaluation with urology next week  #Spoken to oral chemo pharmacy-Xtandi has been approved with minimal co-pay.  This would be shipped to patient's home; not to start until being evaluated by me as planned for next week.   All questions were answered. The patient's daughter knows to call the clinic with any problems, questions or concerns.  Discussed with Dr. Darvin Neighbours  Thank you Dr. Darvin Neighbours for allowing me to participate in  the care of your pleasant patient. Please do not hesitate to contact me with questions or concerns in the interim.    [name], MD 05/18/2017 4:33 PM

## 2017-05-18 NOTE — Progress Notes (Signed)
[facility] [place] NAME: Michael [name]    MR#:  [id number]  DATE OF BIRTH:  [birth year]  SUBJECTIVE:  CHIEF COMPLAINT:   Chief Complaint  Patient presents with  . Urinary Tract Infection   Patient feels well.  Afebrile today.  Clear urine.  Good appetite.  Daughter at bedside.  REVIEW OF SYSTEMS:  Review of Systems  Constitutional: Positive for fever. Negative for chills and weight loss.  HENT: Negative for nosebleeds and sore throat.   Eyes: Negative for blurred vision.  Respiratory: Negative for cough, shortness of breath and wheezing.   Cardiovascular: Negative for chest pain, orthopnea, leg swelling and PND.  Gastrointestinal: Negative for abdominal pain, constipation, diarrhea, heartburn, nausea and vomiting.  Genitourinary: Negative for dysuria and urgency.  Musculoskeletal: Negative for back pain.  Skin: Negative for rash.  Neurological: Negative for dizziness, speech change, focal weakness and headaches.  Endo/Heme/Allergies: Does not bruise/bleed easily.  Psychiatric/Behavioral: Negative for depression.   DRUG ALLERGIES:   Allergies  Allergen Reactions  . Zosyn [Piperacillin Sod-Tazobactam So] Swelling    Lips swollen.  Was given at hospital through IV   VITALS:  Blood pressure 123/74, pulse 88, temperature 97.9 F (36.6 C), temperature source Oral, resp. rate 18, height 5\' 6"  (1.676 m), weight 57.6 kg (127 lb), SpO2 98 %. PHYSICAL EXAMINATION:  Physical Exam  Constitutional: He is oriented to person, place, and time and well-developed, well-nourished, and in no distress.  HENT:  Head: Normocephalic and atraumatic.  Eyes: Pupils are equal, round, and reactive to light. Conjunctivae and EOM are normal.  Neck: Normal range of motion. Neck supple. No tracheal deviation present. No thyromegaly present.  Cardiovascular: Normal rate, regular rhythm and normal heart sounds.  Pulmonary/Chest: Effort normal and breath  sounds normal. No respiratory distress. He has no wheezes. He exhibits no tenderness.  Abdominal: Soft. Bowel sounds are normal. He exhibits no distension. There is no tenderness.  Musculoskeletal: Normal range of motion.  Neurological: He is alert and oriented to person, place, and time. No cranial nerve deficit.  Skin: Skin is warm and dry. No rash noted.  Psychiatric: Mood and affect normal.  Nephrostomy tube in place.  Foley catheter present.  LABORATORY PANEL:  Male CBC Recent Labs  Lab 05/18/17 0334  WBC 8.4  HGB 9.1*  HCT 27.8*  PLT 401   ------------------------------------------------------------------------------------------------------------------ Chemistries  Recent Labs  Lab 05/17/17 1054 05/18/17 0334  NA 133* 135  K 4.4 4.1  CL 102 104  CO2 21* 22  GLUCOSE 226* 89  BUN 21* 16  CREATININE 0.81 0.77  CALCIUM 7.7* 7.7*  AST 29  --   ALT 33  --   ALKPHOS 859*  --   BILITOT 0.4  --    RADIOLOGY:  No results found. ASSESSMENT AND PLAN:  Michael [name]  is a 84 y.o. male with a known history of recent diagnosis of metastatic prostate cancer status post Foley catheter chronic, left ureteral stent placement and percutaneous nephrostomy tube placement in February 2019 when patient presented with urinary retention and workup thereafter showed patient has metastatic prostate cancer currently undergoing chemotherapy at the cancer center.  1.  Acute febrile illness with severe UTI in the setting of chronic Foley and nephrostomy tube History of ESBL E. coli in the past.  Failed treatment with Macrobid as outpatient. Will likely need a PICC line due to cancer, nephrostomy tube in place.  Will wait for final cultures. Daughter does  not like the idea of PICC line.  Will need to have discussion if ESBL again.  Oral fosfomycin would be the other option.  Patient has some budding yeast in the urine.  On Diflucan at this time.  Wait for final cultures.  Could be  colonization.  Has sample sent from nephrostomy tube and Foley catheter.  2.  Urinary retention secondary to metastatic disease in the pelvis involving bladder and rectum with prostate -Continue Foley care and nephrostomy care per protocol  3.  Type 2 diabetes continue sliding scale  - continue insulin lantus 14 units daily  4.  Hypertension - controlled on metoprolol  5.  Prostate adenocarcinoma with metastasis.  Discussed with Dr. Bradd Canary.  Patient will be started on chemotherapy after 1 week.   All the records are reviewed and case discussed with Care Management/Social Worker. Management plans discussed with the patient, family (Spoke with patient's daughter [name] at bedside)  CODE STATUS: Full Code  TOTAL TIME TAKING CARE OF THIS PATIENT: 35 minutes.   POSSIBLE D/C IN 2-3 DAYS, DEPENDING ON CLINICAL CONDITION.  [name] M.D on 05/18/2017 at 3:02 PM  Between 7am to 6pm - Pager - [phone]  After 6pm go to [email or web address] - password [email or web address] [facility]  [facility] [place] [facility]  Office  [phone]  CC: Primary care physician; [name], MD  Note: This dictation was prepared with Dragon dictation along with smaller phrase technology. Any transcriptional errors that result from this process are unintentional.

## 2017-05-18 NOTE — Telephone Encounter (Signed)
Chemo removed from patient's schedule. I changed the apt type in epic apts to reflect- injection only.

## 2017-05-18 NOTE — Progress Notes (Signed)
Inpatient Diabetes Program Recommendations  AACE/ADA: New Consensus Statement on Inpatient Glycemic Control (2015)  Target Ranges:  Prepandial:   less than 140 mg/dL      Peak postprandial:   less than 180 mg/dL (1-2 hours)      Critically ill patients:  140 - 180 mg/dL   Results for AMAURIS, DEBOIS (MRN [id number]) as of 05/18/2017 13:59  Ref. Range 05/18/2017 07:38 05/18/2017 11:39  Glucose-Capillary Latest Ref Range: 65 - 99 mg/dL 93 268 (H)     Home DM Meds: Lantus 14-16 units QHS        Humalog 0-10 units TID per SSI    Current Orders: Lantus 14 units QHS       Novolog Sensitive Correction Scale/ SSI (0-9 units) TID AC + HS      MD- Please consider the following in-hospital insulin adjustments:  Start Novolog Meal Coverage: Novolog 3 units TID with meals (hold if pt eats <50% of meal)     --Will follow patient during hospitalization--  Wyn Quaker RN, MSN, CDE Diabetes Coordinator Inpatient Glycemic Control Team Team Pager: [phone] (8a-5p)

## 2017-05-18 NOTE — Telephone Encounter (Signed)
Oral Oncology Patient Advocate Encounter  Prior Authorization for Michael [name] has been approved.    PA# [id number] Effective dates: 02/20/2017 through 05/17/2020  Oral Oncology Clinic will continue to follow.    [place] Patient Advocate [phone] 05/18/2017 1:43 PM

## 2017-05-18 NOTE — Telephone Encounter (Signed)
Oral Chemotherapy Pharmacist Encounter  Patient Education I spoke with patient's daughter Serapio Edelson for overview of new oral chemotherapy medication: Xtandi (enzalutamide) for the treatment of metastatic castrate sensitive prostate cancer, planned duration until disease progression or unacceptable drug toxicity.   Pt is doing well. Counseled patient on administration, dosing, side effects, monitoring, drug-food interactions, safe handling, storage, and disposal. Patient will take 4 capsules (160 mg total) by mouth daily.  Side effects include but not limited to: fatigue, hot flashes, hypertension, decreased weight, muscle pain.    Reviewed with patient importance of keeping a medication schedule and plan for any missed doses.  Hina voiced understanding and appreciation. All questions answered. Patient is currently inpatient so I dropped a medication handout by his room.  Provided patient with [facility] phone number. Patient knows to call the office with questions or concerns. Oral Chemotherapy Navigation Clinic will continue to follow.  Darl Pikes, PharmD, BCPS Hematology/Oncology Clinical Pharmacist [facility] [phone]  05/18/2017 2:14 PM

## 2017-05-18 NOTE — Progress Notes (Signed)
dsicussed with pharmacist- ordered Gillermina Phy.

## 2017-05-18 NOTE — Telephone Encounter (Signed)
On 4/3-Please cancel the chemo/docetaxel; keep rest of appts- labs/MD/firmagon. Pt to start on oral x-tandi at next visit.

## 2017-05-18 NOTE — Telephone Encounter (Signed)
Oral Oncology Patient Advocate Encounter  Received notification from [facility] that prior authorization for Michael [name] is required.  PA submitted on CoverMyMeds [place] Status is pending  Oral Oncology Clinic will continue to follow.    [place] Patient Advocate [phone] 05/18/2017 1:19 PM

## 2017-05-18 NOTE — Telephone Encounter (Signed)
Oral Oncology Patient Advocate Encounter  Sent e-mail to [facility] to please ship patients Xtandi. Called with CC #.   [place] Patient Advocate [phone] 05/18/2017 1:43 PM

## 2017-05-19 LAB — URINE CULTURE: Culture: >=100000 — AB

## 2017-05-19 LAB — GLUCOSE, CAPILLARY: Glucose-Capillary: 147 mg/dL — ABNORMAL HIGH (ref 65–99)

## 2017-05-19 MED ORDER — CIPROFLOXACIN HCL 500 MG PO TABS: 500.0000 mg | ORAL_TABLET | Freq: Two times a day (BID) | ORAL | 0 refills | Status: AC

## 2017-05-19 MED ORDER — CIPROFLOXACIN HCL 500 MG PO TABS
500.0000 mg | ORAL_TABLET | Freq: Two times a day (BID) | ORAL | Status: DC
  Administered 2017-05-19: 500 mg via ORAL
  Filled 2017-05-19: qty 1

## 2017-05-19 NOTE — Discharge Planning (Signed)
Patient IV removed. RN assessment and VS revealed stability for DC to home with [facility].  Discharge papers given, explained and educated.  Informed of suggested FU appts and appts made.  Script called to Pharm, per family request ([facility] [address]).  Once ready, wheeled to front and family transporting home via car.

## 2017-05-19 NOTE — Discharge Planning (Signed)
Dr. Megan Salon (ID at [facility]) - covering for Dr. Ola Spurr - communicated via telephone with me and indicated that patient's chart/lab results indicate that Cipro PO is good choice for patient's condition and he will NOT need PICC placed or home IV antibiotics at this time. From his standpoint, patient is ok for DC - IF deemed medically stable per Dr. Benjie Karvonen (Hospitalist).

## 2017-05-19 NOTE — Discharge Summary (Signed)
Michael [name] at [place] NAME: Michael [name]    MR#:  [id number]  DATE OF BIRTH:  17-Jul-1933  DATE OF ADMISSION:  05/17/2017 ADMITTING PHYSICIAN: Demetrios Loll, MD  DATE OF DISCHARGE: 05/19/2017  PRIMARY CARE PHYSICIAN: Michael Guise, MD    ADMISSION DIAGNOSIS:  Infection and inflammatory reaction due to nephrostomy catheter, initial encounter [facility]) [T83.512A]  DISCHARGE DIAGNOSIS:  Active Problems:   UTI due to extended-spectrum beta lactamase (ESBL) producing Escherichia coli   SECONDARY DIAGNOSIS:   Past Medical History:  Diagnosis Date  . Benign prostatic hypertrophy   . Bilateral carotid artery disease (HCC)    Mild plaque formation without obstructive disease noted on carotid Doppler.  . Coronary artery disease    Previous cardiac catheterization at [facility] in 2010. The patient was told about 2 blockages which did not require revascularization.  . Diabetes mellitus without complication ([place])   . Essential hypertension   . GERD (gastroesophageal reflux disease)   . Hyperlipidemia   . Hypertension   . Left carotid artery stenosis     HOSPITAL COURSE:   84 year old male with newly diagnosed prostate adenocarcinoma with metastatic disease status post right-sided percutaneous nephrostomy tube who was admitted for fever.  1.  Urinary tract infection with fever: Patient has chronic Foley and nephrostomy tube.  He has a history of ESBL E. coli in the past.  Urine cultures are positive for E. coli during this hospitalization.  This is sensitive to ciprofloxacin.  Patient will be discharged on ciprofloxacin for 10 days.  Patient failed Macrobid as an outpatient.  Patient did have budding yeast in the urine and was on Diflucan however does not need this at discharge.  This is likely colonization.  2.  Urinary retention secondary to metastatic prostate cancer: Patient will follow up with urology.  He will continue finasteride. Continue Foley care  and nephrostomy care per protocol.  3.  Diabetes: Continue ADA diet with outpatient regimen  4.  Essential hypertension: Continue metoprolol  5.  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  Patient will be started on chemotherapy after completing course of antibiotics.    DISCHARGE CONDITIONS AND DIET:   Stable for discharge on diabetic diet  CONSULTS OBTAINED:    DRUG ALLERGIES:   Allergies  Allergen Reactions  . Zosyn [Piperacillin Sod-Tazobactam So] Swelling    Lips swollen.  Was given at hospital through IV    DISCHARGE MEDICATIONS:   Allergies as of 05/19/2017      Reactions   Zosyn [piperacillin Sod-tazobactam So] Swelling   Lips swollen.  Was given at hospital through IV      Medication List    STOP taking these medications   nitrofurantoin (macrocrystal-monohydrate) 100 MG capsule Commonly known as:  MACROBID     TAKE these medications   acetaminophen 500 MG tablet Commonly known as:  TYLENOL Take 500 mg by mouth every 4 (four) hours as needed for moderate pain or fever.   albuterol 108 (90 Base) MCG/ACT inhaler Commonly known as:  PROVENTIL HFA;VENTOLIN HFA Inhale 2 puffs into the lungs every 6 (six) hours as needed for wheezing or shortness of breath.   atorvastatin 10 MG tablet Commonly known as:  LIPITOR Take 10 mg by mouth daily.   CALCIUM 600+D3 600-800 MG-UNIT Tabs Generic drug:  Calcium Carb-Cholecalciferol Take 1 tablet by mouth daily.   cetirizine 10 MG tablet Commonly known as:  ZYRTEC Take 1 tablet (10 mg total) by mouth daily. What changed:    when to take this  reasons to take this   ciprofloxacin 500 MG tablet Commonly known as:  CIPRO Take 1 tablet (500 mg total) by mouth 2 (two) times daily for 10 days.   enzalutamide 40 MG capsule Commonly known as:  XTANDI Take 4 capsules (160 mg total) by mouth daily.   feeding supplement  (GLUCERNA SHAKE) Liqd Take 237 mLs by mouth 3 (three) times daily between meals.   finasteride 5 MG tablet Commonly known as:  PROSCAR Take 5 mg by mouth daily.   glucose blood test strip Commonly known as:  ACCU-CHEK GUIDE Use asdirected three times a day diag E11.65   ibuprofen 200 MG tablet Commonly known as:  ADVIL,MOTRIN Take 200 mg by mouth every 4 (four) hours as needed for fever or moderate pain.   insulin glargine 100 UNIT/ML injection Commonly known as:  LANTUS Inject 14-16 Units into the skin at bedtime. Based on sugar levels in am.   insulin lispro 100 UNIT/ML injection Commonly known as:  HUMALOG Inject 0-10 Units into the skin 3 (three) times daily before meals. <100 = none 100-140 = 0 141-180 = 3u 181-220 = 5-7u 221-260 = 6-9u 261-300 = 7u 301-340 = 8u 341+ = 10u  May be more or less administered depending upon glucose reading   Insulin Syringe-Needle U-100 31G X 5/16" 1 ML Misc Commonly known as:  B-D INS SYR ULTRAFINE 1CC/31G Use as directed   lisinopril 2.5 MG tablet Commonly known as:  PRINIVIL,ZESTRIL Take 2.5 mg by mouth daily.   metoprolol succinate 25 MG 24 hr tablet Commonly known as:  TOPROL-XL Take 25 mg by mouth daily.   multivitamin with minerals tablet Take 1 tablet by mouth daily.   omeprazole 20 MG capsule Commonly known as:  PRILOSEC Take 20 mg by mouth daily.   ondansetron 8 MG tablet Commonly known as:  ZOFRAN One pill every 8 hours as needed for nausea/vomitting. What changed:    how much to take  how to take this  when to take this  reasons to take this  additional instructions   prochlorperazine 10 MG tablet Commonly known as:  COMPAZINE Take 1 tablet (10 mg total) by mouth every 6 (six) hours as needed for nausea or vomiting.         Today   CHIEF COMPLAINT:  Doing well this morning.   VITAL SIGNS:  Blood pressure 122/82, pulse (!) 101, temperature (!) 97.5 F (36.4 C), temperature source Oral,  resp. rate (!) 21, height 5\' 6"  (1.676 m), weight 57.6 kg (127 lb), SpO2 99 %.   REVIEW OF SYSTEMS:  Review of Systems  Constitutional: Negative.  Negative for chills, fever and malaise/fatigue.  HENT: Negative.  Negative for ear discharge, ear pain, hearing loss, nosebleeds and sore throat.   Eyes: Negative.  Negative for blurred vision and pain.  Respiratory: Negative.  Negative for cough, hemoptysis, shortness of breath and wheezing.   Cardiovascular: Negative.  Negative for chest pain, palpitations and leg swelling.  Gastrointestinal: Negative.  Negative for abdominal pain, blood in stool, diarrhea, nausea and vomiting.  Genitourinary: Negative.  Negative for dysuria.  Musculoskeletal: Negative.  Negative for back pain.  Skin: Negative.   Neurological: Negative for dizziness, tremors, speech change, focal weakness, seizures and headaches.  Endo/Heme/Allergies: Negative.  Does not bruise/bleed easily.  Psychiatric/Behavioral: Negative.  Negative for depression, hallucinations and suicidal ideas.  PHYSICAL EXAMINATION:  GENERAL:  84 y.o.-year-old patient lying in the bed with no acute distress.  NECK:  Supple, no jugular venous distention. No thyroid enlargement, no tenderness.  LUNGS: Normal breath sounds bilaterally, no wheezing, rales,rhonchi  No use of accessory muscles of respiration.  CARDIOVASCULAR: S1, S2 normal. No murmurs, rubs, or gallops.  ABDOMEN: Soft, non-tender, non-distended. Bowel sounds present. No organomegaly or mass.  EXTREMITIES: No pedal edema, cyanosis, or clubbing.  PSYCHIATRIC: The patient is alert and oriented x 3.  SKIN: No obvious rash, lesion, or ulcer.    Nephrostomy and Foley catheter in place draining clear urine DATA REVIEW:   CBC Recent Labs  Lab 05/18/17 0334  WBC 8.4  HGB 9.1*  HCT 27.8*  PLT 401    Chemistries  Recent Labs  Lab 05/17/17 1054 05/18/17 0334  NA 133* 135  K 4.4 4.1  CL 102 104  CO2 21* 22  GLUCOSE 226* 89   BUN 21* 16  CREATININE 0.81 0.77  CALCIUM 7.7* 7.7*  AST 29  --   ALT 33  --   ALKPHOS 859*  --   BILITOT 0.4  --     Cardiac Enzymes No results for input(s): TROPONINI in the last 168 hours.  Microbiology Results  @MICRORSLT48 @  RADIOLOGY:  No results found.    Allergies as of 05/19/2017      Reactions   Zosyn [piperacillin Sod-tazobactam So] Swelling   Lips swollen.  Was given at hospital through IV      Medication List    STOP taking these medications   nitrofurantoin (macrocrystal-monohydrate) 100 MG capsule Commonly known as:  MACROBID     TAKE these medications   acetaminophen 500 MG tablet Commonly known as:  TYLENOL Take 500 mg by mouth every 4 (four) hours as needed for moderate pain or fever.   albuterol 108 (90 Base) MCG/ACT inhaler Commonly known as:  PROVENTIL HFA;VENTOLIN HFA Inhale 2 puffs into the lungs every 6 (six) hours as needed for wheezing or shortness of breath.   atorvastatin 10 MG tablet Commonly known as:  LIPITOR Take 10 mg by mouth daily.   CALCIUM 600+D3 600-800 MG-UNIT Tabs Generic drug:  Calcium Carb-Cholecalciferol Take 1 tablet by mouth daily.   cetirizine 10 MG tablet Commonly known as:  ZYRTEC Take 1 tablet (10 mg total) by mouth daily. What changed:    when to take this  reasons to take this   ciprofloxacin 500 MG tablet Commonly known as:  CIPRO Take 1 tablet (500 mg total) by mouth 2 (two) times daily for 10 days.   enzalutamide 40 MG capsule Commonly known as:  XTANDI Take 4 capsules (160 mg total) by mouth daily.   feeding supplement (GLUCERNA SHAKE) Liqd Take 237 mLs by mouth 3 (three) times daily between meals.   finasteride 5 MG tablet Commonly known as:  PROSCAR Take 5 mg by mouth daily.   glucose blood test strip Commonly known as:  ACCU-CHEK GUIDE Use asdirected three times a day diag E11.65   ibuprofen 200 MG tablet Commonly known as:  ADVIL,MOTRIN Take 200 mg by mouth every 4 (four) hours  as needed for fever or moderate pain.   insulin glargine 100 UNIT/ML injection Commonly known as:  LANTUS Inject 14-16 Units into the skin at bedtime. Based on sugar levels in am.   insulin lispro 100 UNIT/ML injection Commonly known as:  HUMALOG Inject 0-10 Units into the skin 3 (three) times daily before meals. <100 = none  100-140 = 0 141-180 = 3u 181-220 = 5-7u 221-260 = 6-9u 261-300 = 7u 301-340 = 8u 341+ = 10u  May be more or less administered depending upon glucose reading   Insulin Syringe-Needle U-100 31G X 5/16" 1 ML Misc Commonly known as:  B-D INS SYR ULTRAFINE 1CC/31G Use as directed   lisinopril 2.5 MG tablet Commonly known as:  PRINIVIL,ZESTRIL Take 2.5 mg by mouth daily.   metoprolol succinate 25 MG 24 hr tablet Commonly known as:  TOPROL-XL Take 25 mg by mouth daily.   multivitamin with minerals tablet Take 1 tablet by mouth daily.   omeprazole 20 MG capsule Commonly known as:  PRILOSEC Take 20 mg by mouth daily.   ondansetron 8 MG tablet Commonly known as:  ZOFRAN One pill every 8 hours as needed for nausea/vomitting. What changed:    how much to take  how to take this  when to take this  reasons to take this  additional instructions   prochlorperazine 10 MG tablet Commonly known as:  COMPAZINE Take 1 tablet (10 mg total) by mouth every 6 (six) hours as needed for nausea or vomiting.          Management plans discussed with the patient and he is in agreement. Stable for discharge home with [facility]  Patient should follow up with oncology and irology  CODE STATUS:     Code Status Orders  (From admission, onward)        Start     Ordered   05/17/17 1654  Full code  Continuous     05/17/17 1653    Code Status History    Date Active Date Inactive Code Status Order ID Comments User Context   05/07/2017 1124 05/09/2017 1844 Full Code [id number]  Fritzi Mandes, MD Inpatient   04/11/2017 1130 04/16/2017 1643 Full Code [id number]  Loletha Grayer, MD Inpatient   02/03/2015 0925 02/03/2015 1542 Full Code [id number]  Wellington [name], MD Inpatient      TOTAL TIME TAKING CARE OF THIS PATIENT: 38 minutes.    Note: This dictation was prepared with Dragon dictation along with smaller phrase technology. Any transcriptional errors that result from this process are unintentional.  Bryn Perkin M.D on 05/19/2017 at 9:55 AM  Between 7am to 6pm - Pager - [phone] After 6pm go to [email or web address] - password [email or web address] [facility]  Office  [phone]  CC: Primary care physician; Michael Guise, MD

## 2017-05-19 NOTE — Care Management (Signed)
Discharge to home today per Dr. Benjie Karvonen. Already active with [facility]. Brenton Grills, [facility] representative updated. Shelbie Ammons RN MSN CCM Care Management [phone]

## 2017-05-19 NOTE — Progress Notes (Signed)
Michael [name]   DOB:[birth year]   PO#:[id number]    Subjective: Denies any fevers.  Appetite is improving.  No nausea no vomiting.  Is getting up/ambulating by himself.  No nausea vomiting.  Interested in going home.  Objective:  Vitals:   05/19/17 0451 05/19/17 0829  BP: 124/77 122/82  Pulse: (!) 101   Resp: (!) 21   Temp: (!) 97.5 F (36.4 C)   SpO2: 99%      Intake/Output Summary (Last 24 hours) at 05/19/2017 1036 Last data filed at 05/19/2017 1023 Gross per 24 hour  Intake 2020 ml  Output 4900 ml  Net -2880 ml    GENERAL: Well-nourished well-developed; Alert, no distress and comfortable.   Accompanied by his daughter. EYES: no pallor or icterus OROPHARYNX: no thrush or ulceration. NECK: supple, no masses felt LYMPH:  no palpable lymphadenopathy in the cervical, axillary or inguinal regions LUNGS: decreased breath sounds to auscultation at bases and  No wheeze or crackles HEART/CVS: regular rate & rhythm and no murmurs; No lower extremity edema ABDOMEN: abdomen soft, non-tender and normal bowel sounds; right-sided nephrostomy tube in place. Musculoskeletal:no cyanosis of digits and no clubbing  PSYCH: alert & oriented x 3 with fluent speech NEURO: no focal motor/sensory deficits SKIN:  no rashes or significant lesions    Labs:  Lab Results  Component Value Date   WBC 8.4 05/18/2017   HGB 9.1 (L) 05/18/2017   HCT 27.8 (L) 05/18/2017   MCV 83.7 05/18/2017   PLT 401 05/18/2017   NEUTROABS 5.4 05/07/2017    Lab Results  Component Value Date   NA 135 05/18/2017   K 4.1 05/18/2017   CL 104 05/18/2017   CO2 22 05/18/2017    Studies:  No results found.  Assessment & Plan:   # 84 year old male patient history of prostate cancer metastatic currently to the hospital for UTI  # Metastatic prostate cancer with involvement of the rectum urinary bladder-castrate sensitive; given the concerns for toxicity/poor tolerance to -docetaxel Zytiga with prednisone -I would  recommend Xtandi.  This has been approved by insurance.  Xtandi should be shipped early next week.  Recommend not starting until evaluated by me next week.   #Poorly controlled diabetes-on insulin; currently better controlled.   #UTI-nephrostomy-ESBL sensitive to ciprofloxacin/yeast possible colonization.  #Right-sided ureteral obstruction secondary to pelvic adenopathy/malignancy-status post nephrostomy; as per the family patient is awaiting repeat evaluation with urology next week. Discussed with Dr. Milinda Cave, MD 05/19/2017  10:36 AM

## 2017-05-19 NOTE — Consult Note (Signed)
Pharmacy Antibiotic Note  ROMANI WILBON is a 84 y.o. male admitted on 05/17/2017 with UTI.  Pharmacy has been consulted for cipro dosing.  Plan: Cipro 500mg  po BID x 3 days (treatment for UTI in men is 5 days, but pt has been on meropenem for 2 days already)  Height: 5\' 6"  (167.6 cm) Weight: 127 lb (57.6 kg) IBW/kg (Calculated) : 63.8  Temp (24hrs), Avg:97.9 F (36.6 C), Min:97.5 F (36.4 C), Max:98.2 F (36.8 C)  Recent Labs  Lab 05/17/17 1054 05/18/17 0334  WBC 10.1 8.4  CREATININE 0.81 0.77  LATICACIDVEN 1.6  --     Estimated Creatinine Clearance: 59 mL/min (by C-G formula based on SCr of 0.77 mg/dL).    Allergies  Allergen Reactions  . Zosyn [Piperacillin Sod-Tazobactam So] Swelling    Lips swollen.  Was given at hospital through IV    Antimicrobials this admission: 3/28 meropenem >> 3/30 cipor 3/30>>  Dose adjustments this admission:   Microbiology results: 3/28 BCx: NG 3/28 UCx: psedudomonas    Thank you for allowing pharmacy to be a part of this patient's care.  Ramond Dial, Pharm.D, BCPS Clinical Pharmacist  05/19/2017 9:22 AM

## 2017-05-20 LAB — URINE CULTURE
Culture: >=100000 — AB
Culture: >=100000 — AB

## 2017-05-20 NOTE — Telephone Encounter (Signed)
No need to flush unless there is any issues with the tube draining.  It will be internalized soon or already may ready been.  [name], MD

## 2017-05-21 ENCOUNTER — Ambulatory Visit: Payer: Self-pay

## 2017-05-21 ENCOUNTER — Ambulatory Visit
Admission: RE | Admit: 2017-05-21 | Discharge: 2017-05-21 | Disposition: A | Discharge status: Home / Self Care | Payer: Medicare Other | Source: Ambulatory Visit | Attending: Urology | Admitting: Urology

## 2017-05-21 ENCOUNTER — Other Ambulatory Visit: Payer: Self-pay

## 2017-05-21 ENCOUNTER — Encounter: Payer: Self-pay | Admitting: Diagnostic Radiology

## 2017-05-21 ENCOUNTER — Ambulatory Visit: Payer: Self-pay | Admitting: Internal Medicine

## 2017-05-21 ENCOUNTER — Telehealth: Payer: Self-pay

## 2017-05-21 DIAGNOSIS — C61 Malignant neoplasm of prostate: Secondary | ICD-10-CM | POA: Diagnosis not present

## 2017-05-21 DIAGNOSIS — Z880 Allergy status to penicillin: Secondary | ICD-10-CM | POA: Insufficient documentation

## 2017-05-21 DIAGNOSIS — N139 Obstructive and reflux uropathy, unspecified: Secondary | ICD-10-CM | POA: Diagnosis not present

## 2017-05-21 DIAGNOSIS — I10 Essential (primary) hypertension: Secondary | ICD-10-CM | POA: Diagnosis not present

## 2017-05-21 DIAGNOSIS — J449 Chronic obstructive pulmonary disease, unspecified: Secondary | ICD-10-CM | POA: Diagnosis not present

## 2017-05-21 DIAGNOSIS — E119 Type 2 diabetes mellitus without complications: Secondary | ICD-10-CM | POA: Diagnosis not present

## 2017-05-21 DIAGNOSIS — K219 Gastro-esophageal reflux disease without esophagitis: Secondary | ICD-10-CM | POA: Insufficient documentation

## 2017-05-21 DIAGNOSIS — Z436 Encounter for attention to other artificial openings of urinary tract: Secondary | ICD-10-CM | POA: Diagnosis not present

## 2017-05-21 DIAGNOSIS — C775 Secondary and unspecified malignant neoplasm of intrapelvic lymph nodes: Secondary | ICD-10-CM | POA: Insufficient documentation

## 2017-05-21 DIAGNOSIS — E785 Hyperlipidemia, unspecified: Secondary | ICD-10-CM | POA: Diagnosis not present

## 2017-05-21 DIAGNOSIS — Z794 Long term (current) use of insulin: Secondary | ICD-10-CM | POA: Insufficient documentation

## 2017-05-21 DIAGNOSIS — I6522 Occlusion and stenosis of left carotid artery: Secondary | ICD-10-CM | POA: Diagnosis not present

## 2017-05-21 DIAGNOSIS — I2510 Atherosclerotic heart disease of native coronary artery without angina pectoris: Secondary | ICD-10-CM | POA: Insufficient documentation

## 2017-05-21 HISTORY — PX: IR CONVERT RIGHT NEPHROSTOMY TO NEPHROURETERAL CATH: IMG6068

## 2017-05-21 MED ORDER — CIPROFLOXACIN IN D5W 400 MG/200ML IV SOLN
400.0000 mg | Freq: Once | INTRAVENOUS | Status: AC
  Administered 2017-05-21: 400 mg via INTRAVENOUS
  Filled 2017-05-21: qty 200

## 2017-05-21 MED ORDER — MIDAZOLAM HCL 2 MG/2ML IJ SOLN
INTRAMUSCULAR | Status: AC | PRN
  Administered 2017-05-21: 1 mg via INTRAVENOUS

## 2017-05-21 MED ORDER — FENTANYL CITRATE (PF) 100 MCG/2ML IJ SOLN
INTRAMUSCULAR | Status: AC | PRN
  Administered 2017-05-21 (×2): 25 ug via INTRAVENOUS

## 2017-05-21 MED ORDER — IOPAMIDOL (ISOVUE-300) INJECTION 61%
30.0000 mL | Freq: Once | INTRAVENOUS | Status: AC | PRN
  Administered 2017-05-21: 15 mL via INTRAVENOUS

## 2017-05-21 MED ORDER — LIDOCAINE HCL (PF) 1 % IJ SOLN
INTRAMUSCULAR | Status: AC
  Filled 2017-05-21: qty 30

## 2017-05-21 MED ORDER — FENTANYL CITRATE (PF) 100 MCG/2ML IJ SOLN
INTRAMUSCULAR | Status: AC
  Filled 2017-05-21: qty 2

## 2017-05-21 MED ORDER — SODIUM CHLORIDE 0.9 % IV SOLN
INTRAVENOUS | Status: DC
  Administered 2017-05-21: 08:00:00 via INTRAVENOUS

## 2017-05-21 MED ORDER — CIPROFLOXACIN IN D5W 400 MG/200ML IV SOLN
INTRAVENOUS | Status: AC
  Filled 2017-05-21: qty 200

## 2017-05-21 MED ORDER — MIDAZOLAM HCL 2 MG/2ML IJ SOLN
INTRAMUSCULAR | Status: AC
  Filled 2017-05-21: qty 2

## 2017-05-21 NOTE — Discharge Instructions (Signed)
Moderate Conscious Sedation, Adult, Care After °These instructions provide you with information about caring for yourself after your procedure. Your health care provider may also give you more specific instructions. Your treatment has been planned according to current medical practices, but problems sometimes occur. Call your health care provider if you have any problems or questions after your procedure. °What can I expect after the procedure? °After your procedure, it is common: °· To feel sleepy for several hours. °· To feel clumsy and have poor balance for several hours. °· To have poor judgment for several hours. °· To vomit if you eat too soon. ° °Follow these instructions at home: °For at least 24 hours after the procedure: ° °· Do not: °? Participate in activities where you could fall or become injured. °? Drive. °? Use heavy machinery. °? Drink alcohol. °? Take sleeping pills or medicines that cause drowsiness. °? Make important decisions or sign legal documents. °? Take care of children on your own. °· Rest. °Eating and drinking °· Follow the diet recommended by your health care provider. °· If you vomit: °? Drink water, juice, or soup when you can drink without vomiting. °? Make sure you have little or no nausea before eating solid foods. °General instructions °· Have a responsible adult stay with you until you are awake and alert. °· Take over-the-counter and prescription medicines only as told by your health care provider. °· If you smoke, do not smoke without supervision. °· Keep all follow-up visits as told by your health care provider. This is important. °Contact a health care provider if: °· You keep feeling nauseous or you keep vomiting. °· You feel light-headed. °· You develop a rash. °· You have a fever. °Get help right away if: °· You have trouble breathing. °This information is not intended to replace advice given to you by your health care provider. Make sure you discuss any questions you have  with your health care provider. °Document Released: 11/27/2012 Document Revised: 07/12/2015 Document Reviewed: 05/29/2015 °Elsevier Interactive Patient Education © 2018 [employer] °Incision Care, Adult °An incision is a surgical cut that is made through your skin. Most incisions are closed after surgery. Your incision may be closed with stitches (sutures), staples, skin glue, or adhesive strips. You may need to return to your health care provider to have sutures or staples removed. This may occur several days to several weeks after your surgery. The incision needs to be cared for properly to prevent infection. °How to care for your incision °Incision care ° °· Follow instructions from your health care provider about how to take care of your incision. Make sure you: °? Wash your hands with soap and water before you change the bandage (dressing). If soap and water are not available, use hand sanitizer. °? Change your dressing as told by your health care provider. °? Leave sutures, skin glue, or adhesive strips in place. These skin closures may need to stay in place for 2 weeks or longer. If adhesive strip edges start to loosen and curl up, you may trim the loose edges. Do not remove adhesive strips completely unless your health care provider tells you to do that. °· Check your incision area every day for signs of infection. Check for: °? More redness, swelling, or pain. °? More fluid or blood. °? Warmth. °? Pus or a bad smell. °· Ask your health care provider how to clean the incision. This may include: °? Using mild soap and water. °? Using a   clean towel to pat the incision dry after cleaning it. °? Applying a cream or ointment. Do this only as told by your health care provider. °? Covering the incision with a clean dressing. °· Ask your health care provider when you can leave the incision uncovered. °· Do not take baths, swim, or use a hot tub until your health care provider approves. Ask your health care  provider if you can take showers. You may only be allowed to take sponge baths for bathing. °Medicines °· If you were prescribed an antibiotic medicine, cream, or ointment, take or apply the antibiotic as told by your health care provider. Do not stop taking or applying the antibiotic even if your condition improves. °· Take over-the-counter and prescription medicines only as told by your health care provider. °General instructions °· Limit movement around your incision to improve healing. °? Avoid straining, lifting, or exercise for the first month, or for as long as told by your health care provider. °? Follow instructions from your health care provider about returning to your normal activities. °? Ask your health care provider what activities are safe. °· Protect your incision from the sun when you are outside for the first 6 months, or for as long as told by your health care provider. Apply sunscreen around the scar or cover it up. °· Keep all follow-up visits as told by your health care provider. This is important. °Contact a health care provider if: °· Your have more redness, swelling, or pain around the incision. °· You have more fluid or blood coming from the incision. °· Your incision feels warm to the touch. °· You have pus or a bad smell coming from the incision. °· You have a fever or shaking chills. °· You are nauseous or you vomit. °· You are dizzy. °· Your sutures or staples come undone. °Get help right away if: °· You have a red streak coming from your incision. °· Your incision bleeds through the dressing and the bleeding does not stop with gentle pressure. °· The edges of your incision open up and separate. °· You have severe pain. °· You have a rash. °· You are confused. °· You faint. °· You have trouble breathing and a fast heartbeat. °This information is not intended to replace advice given to you by your health care provider. Make sure you discuss any questions you have with your health care  provider. °Document Released: 08/26/2004 Document Revised: 10/15/2015 Document Reviewed: 08/25/2015 °Elsevier Interactive Patient Education © 2018 Elsevier Inc. ° °

## 2017-05-21 NOTE — Procedures (Signed)
Successful placement of right ureter stent.  Right nephrostomy was completely removed.  No blood loss and no immediate complication.  Patient to follow up with Urology.

## 2017-05-21 NOTE — Telephone Encounter (Signed)
Nutrition Assessment   Reason for Assessment:   Patient identified on Malnutrition Screening report for poor appetite and weight loss.  Also received alert from inpatient RD  ASSESSMENT:  84 year old male with metastatic prostate cancer.  Planning oral chemotherapy Xtandi but has not started yet per daughter.  Past medical history reviewed.    Spoke with daughter via phone and introduced self and service.  Daughter reports that since coming out of the hospital patient appetite has been really good.  Reports that she has been feeding him every 2 hours.  Reports that he is drinking glucerna shakes and premier protein.  Daughter concerned about blood glucose and hbg.   Nutrition Focused Physical Exam: deferred  Medications: reviewed  Labs: reviewed  Anthropometrics:   Height: 66 inches Weight: 127 lb holding for the last month UBW: 130-140s BMI: 20   Estimated Energy Needs  Kcals: 1700-2000 calories per day Protein: 85-100 g/d Fluid: 2 L/d  NUTRITION DIAGNOSIS: Unintentional weight loss related to cancer and cancer related treatments as evidenced by weight loss to 127 lb from 130-140s   INTERVENTION:   Spoke with daughter about increasing calories and protein to prevent further weight loss. Will mail handout on increasing calories and protein. Encouraged patient to continue to drink oral nutrition supplement for added nutrition Planning to meet with endocrinologist but not until June. Would recommend liberalizing diet to increase calories and protein and covering elevated blood glucose with medication.   Daughter with questions regarding anemia.  Handout mailed as well. Coupons mailed for oral nutrition supplement. Contact information mailed as well.      MONITORING, EVALUATION, GOAL: weight trends, intake, blood glucose   NEXT VISIT: as needed, daughter to contact me  Anjeli Casad B. Zenia Resides, [place], [place] Registered Dietitian [phone] (pager)

## 2017-05-21 NOTE — Progress Notes (Signed)
Patient here today for conversion of neph tube to potential stent placement Per Dr Anselm Pancoast, daughter present and speaks fluent english, patient does not speak english. We attempted to use video interpreter, however patient hard of hearing and not able to hear interpreter even on highest audio. Daughter agreeable to interpret, and has signed a waiver per policy to be responsible interpreter. Dr Anselm Pancoast discussed what the procedure would entail,and interpreted per daughter with questions answered.

## 2017-05-21 NOTE — Telephone Encounter (Signed)
Oral Oncology Patient Advocate Encounter  Patient received his Gillermina Phy 05/18/2017 from [facility].  [facility] Patient Advocate [phone] 05/21/2017 8:44 AM

## 2017-05-21 NOTE — Consult Note (Signed)
Chief Complaint: Patient was seen in consultation today for right ureter stent placement at the request of [place]  Referring Physician(s): Brandon,Ashley  Patient Status: [facility] - Out-pt  History of Present Illness: Michael [name] is a 84 y.o. male with metastatic prostate cancer and obstructive uropathy.  Patient is known to Interventional Radiology and had a right nephrostomy tube placed on 04/13/2017 by Dr. Pascal Lux.  Patient currently has a Foley catheter and a left ureter stent.  Patient was recently hospitalized for urinary tract infection.  Patient is being treated with ciprofloxacin.  Patient does not speak English and patient is unable to use the remote translator due to hearing problems.  Therefore, the patient's daughter is acting as his Optometrist.  She has signed the appropriate documentation in order to be his medical translator.  According to the daughter, the patient has been feeling much better since his recent hospitalization.  He has no evidence of fevers or chills.  He has no specific complaints.  Past Medical History:  Diagnosis Date  . Benign prostatic hypertrophy   . Bilateral carotid artery disease (HCC)    Mild plaque formation without obstructive disease noted on carotid Doppler.  . Coronary artery disease    Previous cardiac catheterization at [facility] in 2010. The patient was told about 2 blockages which did not require revascularization.  . Diabetes mellitus without complication ([place])   . Essential hypertension   . GERD (gastroesophageal reflux disease)   . Hyperlipidemia   . Hypertension   . Left carotid artery stenosis     Past Surgical History:  Procedure Laterality Date  . CARDIAC CATHETERIZATION  2010  . CARDIAC CATHETERIZATION N/A 02/03/2015   Procedure: Left Heart Cath and Coronary Angiography;  Surgeon: Wellington [name], MD;  Location: [place] [facility];  Service: Cardiovascular;  Laterality: N/A;  . CATARACT EXTRACTION    . CYSTOSCOPY W/  RETROGRADES Bilateral 04/11/2017   Procedure: CYSTOSCOPY WITH RETROGRADE PYELOGRAM;  Surgeon: [name], MD;  Location: [facility] ORS;  Service: Urology;  Laterality: Bilateral;  . CYSTOSCOPY WITH STENT PLACEMENT Left 04/11/2017   Procedure: CYSTOSCOPY WITH STENT PLACEMENT and fulgeration;  Surgeon: [name], MD;  Location: [facility] ORS;  Service: Urology;  Laterality: Left;  . CYSTOSCOPY WITH URETHRAL DILATATION Bilateral 04/11/2017   Procedure: CYSTOSCOPY WITH URETHRAL DILATATION;  Surgeon: [name], MD;  Location: [facility] ORS;  Service: Urology;  Laterality: Bilateral;  . IR NEPHROSTOMY PLACEMENT RIGHT  04/13/2017  . PROSTATECTOMY      Allergies: Zosyn [piperacillin sod-tazobactam so]  Medications: Prior to Admission medications   Medication Sig Start Date End Date Taking? Authorizing Provider  acetaminophen (TYLENOL) 500 MG tablet Take 500 mg by mouth every 4 (four) hours as needed for moderate pain or fever.   Yes [provider]  albuterol (PROVENTIL HFA;VENTOLIN HFA) 108 (90 Base) MCG/ACT inhaler Inhale 2 puffs into the lungs every 6 (six) hours as needed for wheezing or shortness of breath. 04/16/17  Yes Dustin Flock, MD  atorvastatin (LIPITOR) 10 MG tablet Take 10 mg by mouth daily.   Yes [provider]  Calcium Carb-Cholecalciferol (CALCIUM 600+D3) 600-800 MG-UNIT TABS Take 1 tablet by mouth daily.   Yes [provider]  cetirizine (ZYRTEC) 10 MG tablet Take 1 tablet (10 mg total) by mouth daily. Patient taking differently: Take 10 mg by mouth daily as needed for allergies.  02/07/17  Yes Boscia, Greer Ee, NP  ciprofloxacin (CIPRO) 500 MG tablet Take 1 tablet (500 mg total) by mouth  2 (two) times daily for 10 days. 05/19/17 05/29/17 Yes Mody, Ulice Bold, MD  feeding supplement, GLUCERNA SHAKE, (GLUCERNA SHAKE) LIQD Take 237 mLs by mouth 3 (three) times daily between meals. 04/20/17  Yes Ronnell Freshwater, NP  glucose blood (ACCU-CHEK GUIDE) test strip Use  asdirected three times a day diag E11.65 04/19/17  Yes [name], MD  ibuprofen (ADVIL,MOTRIN) 200 MG tablet Take 200 mg by mouth every 4 (four) hours as needed for fever or moderate pain.   Yes [provider]  insulin glargine (LANTUS) 100 UNIT/ML injection Inject 14-16 Units into the skin at bedtime. Based on sugar levels in am.   Yes [provider]  insulin lispro (HUMALOG) 100 UNIT/ML injection Inject 0-10 Units into the skin 3 (three) times daily before meals. <100 = none 100-140 = 0 141-180 = 3u 181-220 = 5-7u 221-260 = 6-9u 261-300 = 7u 301-340 = 8u 341+ = 10u  May be more or less administered depending upon glucose reading   Yes [provider]  Insulin Syringe-Needle U-100 (B-D INS SYR ULTRAFINE 1CC/31G) 31G X 5/16" 1 ML MISC Use as directed 04/17/17  Yes Boscia, Heather E, NP  lisinopril (PRINIVIL,ZESTRIL) 2.5 MG tablet Take 2.5 mg by mouth daily. 03/14/17  Yes [provider]  metoprolol succinate (TOPROL-XL) 25 MG 24 hr tablet Take 25 mg by mouth daily.  12/22/14  Yes [provider]  Multiple Vitamins-Minerals (MULTIVITAMIN WITH MINERALS) tablet Take 1 tablet by mouth daily.    Yes [provider]  omeprazole (PRILOSEC) 20 MG capsule Take 20 mg by mouth daily. 01/04/15  Yes [provider]  ondansetron (ZOFRAN) 8 MG tablet One pill every 8 hours as needed for nausea/vomitting. Patient taking differently: Take 8 mg by mouth every 8 (eight) hours as needed for nausea or vomiting.  05/05/17  Yes [name], MD  enzalutamide Gillermina Phy) 40 MG capsule Take 4 capsules (160 mg total) by mouth daily. Patient not taking: Reported on 05/21/2017 05/18/17   [name], MD  finasteride (PROSCAR) 5 MG tablet Take 5 mg by mouth daily.    [provider]  prochlorperazine (COMPAZINE) 10 MG tablet Take 1 tablet (10 mg total) by mouth every 6 (six) hours as needed for nausea or vomiting. Patient not taking:  Reported on 05/21/2017 05/05/17   [name], MD     Family History  Problem Relation Age of Onset  . Hypertension Father     Social History   Socioeconomic History  . Marital status: Widowed    Spouse name: Not on file  . Number of children: Not on file  . Years of education: Not on file  . Highest education level: Not on file  Occupational History  . Not on file  Social Needs  . Financial resource strain: Not on file  . Food insecurity:    Worry: Not on file    Inability: Not on file  . Transportation needs:    Medical: Not on file    Non-medical: Not on file  Tobacco Use  . Smoking status: Never Smoker  . Smokeless tobacco: Never Used  Substance and Sexual Activity  . Alcohol use: No  . Drug use: No  . Sexual activity: Not on file  Lifestyle  . Physical activity:    Days per week: Not on file    Minutes per session: Not on file  . Stress: Not on file  Relationships  . Social connections:    Talks  on phone: Not on file    Gets together: Not on file    Attends religious service: Not on file    Active member of club or organization: Not on file    Attends meetings of clubs or organizations: Not on file    Relationship status: Not on file  Other Topics Concern  . Not on file  Social History Narrative  . Not on file      Review of Systems: A 12 point ROS discussed and pertinent positives are indicated in the HPI above.  All other systems are negative.  Review of Systems  Constitutional: Negative for chills and fever.    Vital Signs: Pulse 92   Temp 97.6 F (36.4 C) (Oral)   Resp 20   Ht 5\' 6"  (1.676 m)   Wt 127 lb (57.6 kg)   SpO2 100%   BMI 20.50 kg/m   Physical Exam  Constitutional: No distress.  HENT:  Mouth/Throat: Oropharynx is clear and moist.  Cardiovascular: Normal rate, regular rhythm and normal heart sounds.  Pulmonary/Chest: Effort normal and breath sounds normal.  Abdominal: Soft. Bowel sounds are normal.  Right  nephrostomy tube is intact.  The bandage is dry and clean.  Clear yellow urine within the nephrostomy bag.  Foley catheter is present.  Clear urine in the Foley bag.    Imaging: Dg Chest 2 View  Result Date: 05/07/2017 CLINICAL DATA:  Cough.  Prostate cancer. EXAM: CHEST - 2 VIEW COMPARISON:  04/14/2017 FINDINGS: COPD with hyperinflation of the lungs. Negative for infiltrate effusion or mass. No lung nodule or adenopathy in the chest Right nephrostomy.  Left ureteral stent. No skeletal lesions identified IMPRESSION: COPD without acute abnormality. Electronically Signed   By: Franchot Gallo M.D.   On: 05/07/2017 08:33   Nm Pet (axumin) Skull Base To Mid Thigh  Result Date: 04/26/2017 CLINICAL DATA:  Prostate carcinoma with biochemical recurrence. Recent diagnosis of prostate cancer. RIGHT nephrostomy tube. History of prostate surgery. EXAM: NUCLEAR MEDICINE PET SKULL BASE TO THIGH TECHNIQUE: 9.2 mCi F-18 Fluciclovine was injected intravenously. Full-ring PET imaging was performed from the skull base to thigh after the radiotracer. CT data was obtained and used for attenuation correction and anatomic localization. COMPARISON:  CT 04/11/2017 FINDINGS: NECK No radiotracer activity in neck lymph nodes. CHEST No radiotracer accumulation within mediastinal or hilar lymph nodes. No suspicious pulmonary nodules on the CT scan. ABDOMEN/PELVIS: Moderate diffuse radiotracer activity within the prostate gland without focality (SUV max equal 5.1). Foley catheter extends through the prostate gland into the bladder. Intense radiotracer activity accumulates within the enlarged iliac lymph nodes. For example 2.7 cm short axis LEFT operator/iliac node with SUV max equal 7.4. Similar RIGHT external iliac lymph node with SUV max equal 7.8. Adenopathy extends along the iliac chain to the aortic bifurcation. Lymph node at the bifurcation with SUV max equal 6.1. Intense radiotracer activity associated with LEFT periaortic lymph  nodes. Example cluster of lymph nodes LEFT aorta at the level of the LEFT kidney with SUV max equal 7.0. The most superior metastatic lymph node is at the LEFT renal vein. No focal abnormal activity within the liver. Physiologic activity noted within the pancreas and liver. Percutaneous nephrostomy tube on the RIGHT. Double-J ureteral stent on the LEFT. SKELETON There is diffuse radiotracer activity throughout the skeleton with some heterogeneity of signal intensity. There is diffuse sclerosis of the bones in the spine and pelvis. No discrete lesions identified by CT or PET data set. IMPRESSION: 1.  Bulky bilateral iliac and periaortic lymphadenopathy with intense radiotracer activity consists with prostate cancer metastasis. The highest metastatic lymph nodes at the level of the LEFT renal vein in the periaortic retroperitoneum 2. Mild diffuse activity in the prostate gland is non-specific. 3. No additional soft tissue/visceral metastasis. 4. Diffuse radiotracer activity within the marrow space as well as diffuse sclerosis. Differential would include metabolic disorder, hyperplasia from anemia or diffuse prostate cancer metastasis. MRI may or may not be helpful. Consider biopsy if clinically relevant. Electronically Signed   By: Suzy Bouchard M.D.   On: 04/26/2017 16:38    Labs:  CBC: Recent Labs    05/07/17 0740 05/09/17 0538 05/17/17 1054 05/18/17 0334  WBC 7.5 7.2 10.1 8.4  HGB 10.4* 9.6* 9.3* 9.1*  HCT 31.7* 29.0* 27.7* 27.8*  PLT 255 276 405 401    COAGS: Recent Labs    04/08/17 1026 04/11/17 1719 04/12/17 1139  INR 1.28 1.12 1.19  APTT  --  33  --     BMP: Recent Labs    05/08/17 0542 05/09/17 0538 05/17/17 1054 05/18/17 0334  NA 136 134* 133* 135  K 4.1 4.4 4.4 4.1  CL 104 104 102 104  CO2 25 23 21* 22  GLUCOSE 135* 163* 226* 89  BUN 18 16 21* 16  CALCIUM 7.8* 7.7* 7.7* 7.7*  CREATININE 0.94 0.73 0.81 0.77  GFRNONAA >60 >60 >60 >60  GFRAA >60 >60 >60 >60     LIVER FUNCTION TESTS: Recent Labs    04/20/17 0814 05/04/17 0938 05/07/17 0740 05/17/17 1054  BILITOT 0.3 0.4 0.5 0.4  AST 28 24 30 29   ALT 29 25 29  33  ALKPHOS 136* 465* 522* 859*  PROT 8.0 7.7 7.9 7.6  ALBUMIN 3.1* 3.2* 3.0* 2.8*    TUMOR MARKERS: No results for input(s): AFPTM, CEA, CA199, CHROMGRNA in the last 8760 hours.  Assessment and Plan:  84 year old with metastatic prostate cancer and obstructive uropathy.  Urinary system is currently decompressed with a left ureteral stent, right nephrostomy tube and Foley catheter.  Patient is scheduled for attempted placement of a right antegrade ureter stent and removal of the right nephrostomy tube.  I discussed the procedure with the patient and daughter.  We explained the risks of bleeding, infection and inability to place a ureteral stent based on his disease.  Patient and daughter have a good understanding of the procedure and informed consent was obtained.  Plan to use moderate sedation for the procedure.  Thank you for this interesting consult.  I greatly enjoyed meeting Michael [name] and look forward to participating in their care.  A copy of this report was sent to the requesting provider on this date.  Electronically Signed: Burman Riis, MD 05/21/2017, 8:26 AM   I spent a total of    10 Minutes in face to face in clinical consultation, greater than 50% of which was counseling/coordinating care for metastatic prostate cancer and urinary obstruction.

## 2017-05-22 ENCOUNTER — Other Ambulatory Visit: Payer: Self-pay

## 2017-05-22 ENCOUNTER — Encounter: Payer: Self-pay | Admitting: Internal Medicine

## 2017-05-22 ENCOUNTER — Ambulatory Visit (INDEPENDENT_AMBULATORY_CARE_PROVIDER_SITE_OTHER): Payer: Medicare Other | Admitting: Internal Medicine

## 2017-05-22 VITALS — BP 140/84 | HR 100 | Resp 16 | Ht 66.0 in | Wt 127.6 lb

## 2017-05-22 DIAGNOSIS — R531 Weakness: Secondary | ICD-10-CM | POA: Diagnosis not present

## 2017-05-22 DIAGNOSIS — C7982 Secondary malignant neoplasm of genital organs: Secondary | ICD-10-CM

## 2017-05-22 DIAGNOSIS — E1165 Type 2 diabetes mellitus with hyperglycemia: Secondary | ICD-10-CM | POA: Diagnosis not present

## 2017-05-22 LAB — CULTURE, BLOOD (ROUTINE X 2)
Culture: NO GROWTH
Culture: NO GROWTH

## 2017-05-22 NOTE — Patient Outreach (Signed)
[place] [facility]) Care Management  05/22/2017  Michael [name] 01-31-1934 [id number]   EMMI:General discharge Referral date:05/14/17 Referral reason:Unfilled prescriptions: YES Day #1 Attempt #3  Telephone call to [place] EMMI general discharge red alert. Unable to reach patient. HIPAA compliant voice message left with call back phone number   PLAN:If no return call from patient within 4 business days will proceed with closure.  [name] RN,BSN,CCM [facility] Telephonic  [phone]

## 2017-05-22 NOTE — Patient Outreach (Signed)
[place] [facility]) Care Management  05/22/2017  Michael [name] 11-01-1933 [id number]   ADT notification received of patients hospital admission. Notified [facility] hospital liaison's of patients admission.  PLAN:  RNCM will close patient at this time.  [name] RN,BSN,CCM [facility] Telephonic  [phone]

## 2017-05-22 NOTE — Patient Outreach (Signed)
[place] [facility]) Care Management  05/22/2017  Michael [name] Oct 09, 1933 [id number]  Correction: Notification received from [place], hospital liaison regarding patient.  States patient not admitted to hospital but in hospital for testing only.    PLAN; RNCM will continue to follow outreach process.  If no return call from patient within 4 business days will proceed with case closure.   [name] RN,BSN,CCM [facility] Telephonic  [phone]

## 2017-05-23 ENCOUNTER — Inpatient Hospital Stay: Payer: Medicare Other

## 2017-05-23 ENCOUNTER — Telehealth: Payer: Self-pay | Admitting: *Deleted

## 2017-05-23 ENCOUNTER — Other Ambulatory Visit: Payer: Self-pay

## 2017-05-23 ENCOUNTER — Encounter: Payer: Self-pay | Admitting: Internal Medicine

## 2017-05-23 ENCOUNTER — Inpatient Hospital Stay: Payer: Medicare Other | Attending: Internal Medicine

## 2017-05-23 ENCOUNTER — Telehealth: Payer: Self-pay

## 2017-05-23 ENCOUNTER — Inpatient Hospital Stay (HOSPITAL_BASED_OUTPATIENT_CLINIC_OR_DEPARTMENT_OTHER): Payer: Medicare Other | Admitting: Internal Medicine

## 2017-05-23 VITALS — BP 132/88 | HR 89 | Temp 97.9°F | Resp 18 | Ht 66.0 in | Wt 127.0 lb

## 2017-05-23 DIAGNOSIS — C61 Malignant neoplasm of prostate: Secondary | ICD-10-CM | POA: Diagnosis not present

## 2017-05-23 DIAGNOSIS — Z79899 Other long term (current) drug therapy: Secondary | ICD-10-CM

## 2017-05-23 DIAGNOSIS — I2510 Atherosclerotic heart disease of native coronary artery without angina pectoris: Secondary | ICD-10-CM | POA: Diagnosis not present

## 2017-05-23 DIAGNOSIS — Z5111 Encounter for antineoplastic chemotherapy: Secondary | ICD-10-CM | POA: Insufficient documentation

## 2017-05-23 DIAGNOSIS — E119 Type 2 diabetes mellitus without complications: Secondary | ICD-10-CM | POA: Insufficient documentation

## 2017-05-23 DIAGNOSIS — N139 Obstructive and reflux uropathy, unspecified: Secondary | ICD-10-CM | POA: Insufficient documentation

## 2017-05-23 DIAGNOSIS — K219 Gastro-esophageal reflux disease without esophagitis: Secondary | ICD-10-CM

## 2017-05-23 DIAGNOSIS — Z191 Hormone sensitive malignancy status: Secondary | ICD-10-CM

## 2017-05-23 DIAGNOSIS — E785 Hyperlipidemia, unspecified: Secondary | ICD-10-CM | POA: Diagnosis not present

## 2017-05-23 DIAGNOSIS — N1330 Unspecified hydronephrosis: Secondary | ICD-10-CM | POA: Diagnosis not present

## 2017-05-23 DIAGNOSIS — E1165 Type 2 diabetes mellitus with hyperglycemia: Secondary | ICD-10-CM | POA: Diagnosis not present

## 2017-05-23 DIAGNOSIS — C7951 Secondary malignant neoplasm of bone: Secondary | ICD-10-CM | POA: Diagnosis not present

## 2017-05-23 DIAGNOSIS — E1151 Type 2 diabetes mellitus with diabetic peripheral angiopathy without gangrene: Secondary | ICD-10-CM | POA: Diagnosis not present

## 2017-05-23 DIAGNOSIS — I10 Essential (primary) hypertension: Secondary | ICD-10-CM | POA: Insufficient documentation

## 2017-05-23 DIAGNOSIS — R748 Abnormal levels of other serum enzymes: Secondary | ICD-10-CM | POA: Diagnosis not present

## 2017-05-23 DIAGNOSIS — Z466 Encounter for fitting and adjustment of urinary device: Secondary | ICD-10-CM | POA: Diagnosis not present

## 2017-05-23 DIAGNOSIS — Z794 Long term (current) use of insulin: Secondary | ICD-10-CM

## 2017-05-23 DIAGNOSIS — N320 Bladder-neck obstruction: Secondary | ICD-10-CM | POA: Diagnosis not present

## 2017-05-23 DIAGNOSIS — Z436 Encounter for attention to other artificial openings of urinary tract: Secondary | ICD-10-CM | POA: Diagnosis not present

## 2017-05-23 LAB — CBC WITH DIFFERENTIAL/PLATELET
Basophils Absolute: 0.1 10*3/uL (ref 0–0.1)
Basophils Relative: 1 %
Eosinophils Absolute: 0.1 10*3/uL (ref 0–0.7)
Eosinophils Relative: 1 %
HCT: 29.2 % — ABNORMAL LOW (ref 40.0–52.0)
Hemoglobin: 10 g/dL — ABNORMAL LOW (ref 13.0–18.0)
Lymphocytes Relative: 22 %
Lymphs Abs: 1.9 10*3/uL (ref 1.0–3.6)
MCH: 29 pg (ref 26.0–34.0)
MCHC: 34.3 g/dL (ref 32.0–36.0)
MCV: 84.6 fL (ref 80.0–100.0)
Monocytes Absolute: 0.9 10*3/uL (ref 0.2–1.0)
Monocytes Relative: 10 %
Neutro Abs: 5.7 10*3/uL (ref 1.4–6.5)
Neutrophils Relative %: 66 %
Platelets: 444 10*3/uL — ABNORMAL HIGH (ref 150–440)
RBC: 3.46 MIL/uL — ABNORMAL LOW (ref 4.40–5.90)
RDW: 16.9 % — ABNORMAL HIGH (ref 11.5–14.5)
WBC: 8.7 10*3/uL (ref 3.8–10.6)

## 2017-05-23 LAB — BASIC METABOLIC PANEL
Anion gap: 8 (ref 5–15)
BUN: 23 mg/dL — ABNORMAL HIGH (ref 6–20)
CO2: 24 mmol/L (ref 22–32)
Calcium: 8.4 mg/dL — ABNORMAL LOW (ref 8.9–10.3)
Chloride: 102 mmol/L (ref 101–111)
Creatinine, Ser: 0.84 mg/dL (ref 0.61–1.24)
GFR calc Af Amer: >60 mL/min (ref >60)
GFR calc non Af Amer: >60 mL/min (ref >60)
Glucose, Bld: 285 mg/dL — ABNORMAL HIGH (ref 65–99)
Potassium: 5.2 mmol/L — ABNORMAL HIGH (ref 3.5–5.1)
Sodium: 134 mmol/L — ABNORMAL LOW (ref 135–145)

## 2017-05-23 MED ORDER — DEGARELIX ACETATE 80 MG ~~LOC~~ SOLR
80.0000 mg | Freq: Once | SUBCUTANEOUS | Status: AC
  Administered 2017-05-23: 80 mg via SUBCUTANEOUS
  Filled 2017-05-23: qty 4

## 2017-05-23 NOTE — Progress Notes (Signed)
Patient here for follow-up for prostate cancer. He started the xstandi today. He reports blood in the cath. He had a right nephrostomy changed to a nephrourethral cath on Monday 05/21/17.

## 2017-05-23 NOTE — Assessment & Plan Note (Addendum)
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  PET scan March 2019-pelvic/prostate uptake; diffuse bony uptake; without any bony lesions noted on CT imaging.  #Continue degarelix; response noted with improvement of PSA/also clinical improvement.  #Given the castrate sensitive disease-the recommendation was to start with docetaxel chemotherapy; however given multiple hospitalization/UTIs/age & borderline performance status-I would recommend addition of novel anti-hormonal therapy.  #Given poorly controlled diabetes/multiple UTIs-and the need for prednisone with Zytiga; I recommend use of Xtandi 4 pills a day.   #Again reviewed the potential side effects of Xtandi including fatigue/risk of seizures.   # poorly controlled Blood glucose-patient is on lantus/ novolo;  lantus 14-16 at night ; continue humalog scale.    # UTI- on ciprofloaxcin.  # bone mets on PET scan; no obvious CT correlate-hold any further imaging or biopsy at this time- Hold x-geva-  given hypocalcemia.  # Elevated alkaline phosphatase-bony metastases versus others. Monitor for now.   # follow up in 1 month/degarelix; labs- cbc/cmp/psa.

## 2017-05-23 NOTE — Telephone Encounter (Signed)
Sarah from [place] called asking for a verbal order for nursing once a week for 4 weeks for pt. Verbal orders were given. Her phone number is [phone].

## 2017-05-23 NOTE — Telephone Encounter (Signed)
I have not heard back from Dr. Cherrie Gauze office. I sent a msg in epic to her LPN. Please advise daughter to contact Dr. Cherrie Gauze office to discuss dressing changes.

## 2017-05-23 NOTE — Telephone Encounter (Signed)
Agree with your assessment and plan.  Increase fluids, present for evaluation if catheter stops draining or develops large clots.  Hematuria should improve/resolve spontaneously.  [name], MD

## 2017-05-23 NOTE — Telephone Encounter (Signed)
Ms Stfort called asking if we received anything from Urology regarding dressing changes and would like for Nira Conn to return her call [phone]

## 2017-05-23 NOTE — Progress Notes (Signed)
[place] NOTE  Patient Care Team: [name], MD as PCP - General (Internal Medicine)  CHIEF COMPLAINTS/PURPOSE OF CONSULTATION:  Metastatic prostate cancer  #  Oncology History   # FEB 2019-Metastatic prostate cancer/ castrate sensitive [bulky retroperitoneal adenopathy; invasion into the bladder; rectum] Bladder Bx- prostate. On Degarelix  # April 3rd 2019- [name] [175m/d]   # Poorly controlled DM-on insulin  # s/p right PCN/ Foley cath [Dr.Brandon]; multiple UTIs.     Prostate cancer ([place])     HISTORY OF PRESENTING ILLNESS:  Michael [name] 84 y.o.  male with a recently diagnosed metastatic cancer sensitive prostate cancer is here for follow-up.  In the interim patient was admitted to the hospital for UTI with sepsis-currently on ciprofloxacin.  Patient's symptoms are significantly better.  His appetite is improving.  He is more active at home.  He denies any chest pain or shortness of breath or cough.  States his blood sugars are better controlled.  Patient recently also had his right nephrostomy tube taken out; and has ureteral stent in place.  ROS: A complete 10 point review of system is done which is negative except mentioned above in history of present illness  MEDICAL HISTORY:  Past Medical History:  Diagnosis Date  . Benign prostatic hypertrophy   . Bilateral carotid artery disease (HCC)    Mild plaque formation without obstructive disease noted on carotid Doppler.  . Coronary artery disease    Previous cardiac catheterization at [facility] in 2010. The patient was told about 2 blockages which did not require revascularization.  . Diabetes mellitus without complication ([place])   . Essential hypertension   . GERD (gastroesophageal reflux disease)   . Hyperlipidemia   . Hypertension   . Left carotid artery stenosis     SURGICAL HISTORY: Past Surgical History:  Procedure Laterality Date  . CARDIAC CATHETERIZATION  2010  . CARDIAC  CATHETERIZATION N/A 02/03/2015   Procedure: Left Heart Cath and Coronary Angiography;  Surgeon: Wellington [name], MD;  Location: [place] [facility];  Service: Cardiovascular;  Laterality: N/A;  . CATARACT EXTRACTION    . CYSTOSCOPY W/ RETROGRADES Bilateral 04/11/2017   Procedure: CYSTOSCOPY WITH RETROGRADE PYELOGRAM;  Surgeon: [name], MD;  Location: [facility];  Service: Urology;  Laterality: Bilateral;  . CYSTOSCOPY WITH STENT PLACEMENT Left 04/11/2017   Procedure: CYSTOSCOPY WITH STENT PLACEMENT and fulgeration;  Surgeon: [name], MD;  Location: [facility] OR[facility];  Service: Urology;  Laterality: Left;  . CYSTOSCOPY WITH URETHRAL DILATATION Bilateral 04/11/2017   Procedure: CYSTOSCOPY WITH URETHRAL DILATATION;  Surgeon: [name], MD;  Location: [facility] OR[facility];  Service: Urology;  Laterality: Bilateral;  . IR CONVERT RIGHT NEPHROSTOMY TO NEPHROURETERAL CATH  05/21/2017  . IR NEPHROSTOMY PLACEMENT RIGHT  04/13/2017  . PROSTATECTOMY      SOCIAL HISTORY: Social History   Socioeconomic History  . Marital status: Widowed    Spouse name: Not on file  . Number of children: Not on file  . Years of education: Not on file  . Highest education level: Not on file  Occupational History  . Not on file  Social Needs  . Financial resource strain: Not on file  . Food insecurity:    Worry: Not on file    Inability: Not on file  . Transportation needs:    Medical: Not on file    Non-medical: Not on file  Tobacco Use  . Smoking status: Never Smoker  . Smokeless tobacco: Never Used  Substance  and Sexual Activity  . Alcohol use: No  . Drug use: No  . Sexual activity: Not on file  Lifestyle  . Physical activity:    Days per week: Not on file    Minutes per session: Not on file  . Stress: Not on file  Relationships  . Social connections:    Talks on phone: Not on file    Gets together: Not on file    Attends religious service: Not on file    Active member of club or organization: Not on  file    Attends meetings of clubs or organizations: Not on file    Relationship status: Not on file  . Intimate partner violence:    Fear of current or ex partner: Not on file    Emotionally abused: Not on file    Physically abused: Not on file    Forced sexual activity: Not on file  Other Topics Concern  . Not on file  Social History Narrative  . Not on file    FAMILY HISTORY: Family History  Problem Relation Age of Onset  . Hypertension Father     ALLERGIES:  has No Known Allergies.  MEDICATIONS:  Current Outpatient Medications  Medication Sig Dispense Refill  . atorvastatin (LIPITOR) 10 MG tablet Take 10 mg by mouth daily.    . Calcium Carb-Cholecalciferol (CALCIUM 600+D3) 600-800 MG-UNIT TABS Take 1 tablet by mouth daily.    . enzalutamide (XTANDI) 40 MG capsule Take 4 capsules (160 mg total) by mouth daily. 120 capsule 0  . feeding supplement, GLUCERNA SHAKE, (GLUCERNA SHAKE) LIQD Take 237 mLs by mouth 3 (three) times daily between meals. 90 Can 5  . glucose blood (ACCU-CHEK GUIDE) test strip Use asdirected three times a day diag E11.65 100 each 3  . insulin glargine (LANTUS) 100 UNIT/ML injection Inject 14-16 Units into the skin at bedtime. Based on sugar levels in am.    . insulin lispro (HUMALOG) 100 UNIT/ML injection Inject 0-10 Units into the skin 3 (three) times daily before meals. <100 = none 100-140 = 0 141-180 = 3u 181-220 = 5-7u 221-260 = 6-9u 261-300 = 7u 301-340 = 8u 341+ = 10u  May be more or less administered depending upon glucose reading    . Insulin Syringe-Needle U-100 (B-D INS SYR ULTRAFINE 1CC/31G) 31G X 5/16" 1 ML MISC Use as directed 100 each 0  . lisinopril (PRINIVIL,ZESTRIL) 2.5 MG tablet Take 2.5 mg by mouth daily.  4  . metoprolol succinate (TOPROL-XL) 25 MG 24 hr tablet Take 25 mg by mouth daily.   6  . Multiple Vitamins-Minerals (MULTIVITAMIN WITH MINERALS) tablet Take 1 tablet by mouth daily.     [name] omeprazole (PRILOSEC) 20 MG capsule Take  20 mg by mouth daily.  1  . acetaminophen (TYLENOL) 500 MG tablet Take 500 mg by mouth every 4 (four) hours as needed for moderate pain or fever.    [name] albuterol (PROVENTIL HFA;VENTOLIN HFA) 108 (90 Base) MCG/ACT inhaler Inhale 2 puffs into the lungs every 6 (six) hours as needed for wheezing or shortness of breath. (Patient not taking: Reported on 05/23/2017) 1 Inhaler 2  . cetirizine (ZYRTEC) 10 MG tablet Take 1 tablet (10 mg total) by mouth daily. (Patient not taking: Reported on 05/23/2017) 30 tablet 6  . ibuprofen (ADVIL,MOTRIN) 200 MG tablet Take 200 mg by mouth every 4 (four) hours as needed for fever or moderate pain.    [name] ondansetron (ZOFRAN) 8 MG tablet One pill every 8  hours as needed for nausea/vomitting. (Patient not taking: Reported on 05/23/2017) 40 tablet 1  . prochlorperazine (COMPAZINE) 10 MG tablet Take 1 tablet (10 mg total) by mouth every 6 (six) hours as needed for nausea or vomiting. (Patient not taking: Reported on 05/21/2017) 40 tablet 1   No current facility-administered medications for this visit.       [name]  PHYSICAL EXAMINATION: ECOG PERFORMANCE STATUS: 2 - Symptomatic, <50% confined to bed  Vitals:   05/23/17 0945  BP: 132/88  Pulse: 89  Resp: 18  Temp: 97.9 F (36.6 C)   Filed Weights   05/23/17 1045  Weight: 127 lb (57.6 kg)    GENERAL: Well-nourished well-developed; Alert, no distress and comfortable.   Accompanied by daughter/family. EYES: no pallor or icterus OROPHARYNX: no thrush or ulceration; good dentition  NECK: supple, no masses felt LYMPH:  no palpable lymphadenopathy in the cervical, axillary or inguinal regions LUNGS: clear to auscultation and  No wheeze or crackles HEART/CVS: regular rate & rhythm and no murmurs; No lower extremity edema ABDOMEN: abdomen soft, non-tender and normal bowel sounds; Foley catheter in place; percutaneous nephrostomy tube in place Musculoskeletal:no cyanosis of digits and no clubbing  PSYCH: alert & oriented x 3 with  fluent speech NEURO: no focal motor/sensory deficits SKIN:  no rashes or significant lesions  LABORATORY DATA:  I have reviewed the data as listed Lab Results  Component Value Date   WBC 8.7 05/23/2017   HGB 10.0 (L) 05/23/2017   HCT 29.2 (L) 05/23/2017   MCV 84.6 05/23/2017   PLT 444 (H) 05/23/2017   Recent Labs    05/04/17 0938 05/07/17 0740  05/17/17 1054 05/18/17 0334 05/23/17 0840  NA 126* 124*   < > 133* 135 134*  K 4.7 4.0   < > 4.4 4.1 5.2*  CL 96* 95*   < > 102 104 102  CO2 22 20*   < > 21* 22 24  GLUCOSE 268* 274*   < > 226* 89 285*  BUN 21* 22*   < > 21* 16 23*  CREATININE 0.82 1.00   < > 0.81 0.77 0.84  CALCIUM 8.0* 7.6*   < > 7.7* 7.7* 8.4*  GFRNONAA >60 >60   < > >60 >60 >60  GFRAA >60 >60   < > >60 >60 >60  PROT 7.7 7.9  --  7.6  --   --   ALBUMIN 3.2* 3.0*  --  2.8*  --   --   AST 24 30  --  29  --   --   ALT 25 29  --  33  --   --   ALKPHOS 465* 522*  --  859*  --   --   BILITOT 0.4 0.5  --  0.4  --   --    < > = values in this interval not displayed.    RADIOGRAPHIC STUDIES: I have personally reviewed the radiological images as listed and agreed with the findings in the report. Dg Chest 2 View  Result Date: 05/07/2017 CLINICAL DATA:  Cough.  Prostate cancer. EXAM: CHEST - 2 VIEW COMPARISON:  04/14/2017 FINDINGS: COPD with hyperinflation of the lungs. Negative for infiltrate effusion or mass. No lung nodule or adenopathy in the chest Right nephrostomy.  Left ureteral stent. No skeletal lesions identified IMPRESSION: COPD without acute abnormality. Electronically Signed   By: Franchot Gallo M.D.   On: 05/07/2017 08:33   Ir Convert Right Nephrostomy To Nephroureteral Cath  Result Date: 05/21/2017 INDICATION: 84 year old with prostate cancer and history of obstructive uropathy. Patient currently has a right nephrostomy tube, left ureteral stent and a Foley catheter. Patient presents for conversion of the nephrostomy tube to an antegrade ureteral stent.  EXAM: PLACEMENT OF ANTEGRADE RIGHT URETERAL STENT WITH EXISTING RIGHT NEPHROSTOMY TUBE ACCESS COMPARISON:  04/13/2017 MEDICATIONS: Ciprofloxacin 400 mg; The antibiotic was administered in an appropriate time frame prior to skin puncture. ANESTHESIA/SEDATION: Fentanyl 25 mcg IV; Versed 1.0 mg IV Moderate Sedation Time:  25 minutes The patient was continuously monitored during the procedure by the interventional radiology nurse under my direct supervision. CONTRAST:  15 mL Isovue-300-administered into the collecting system(s) FLUOROSCOPY TIME:  Fluoroscopy Time: 4 minutes and 24 seconds, 21 mGy COMPLICATIONS: None immediate. PROCEDURE: Informed written consent was obtained from the patient and daughter after a thorough discussion of the procedural risks, benefits and alternatives. All questions were addressed. Maximal Sterile Barrier Technique was utilized including caps, mask, sterile gowns, sterile gloves, sterile drape, hand hygiene and skin antiseptic. A timeout was performed prior to the initiation of the procedure. Patient was placed prone on the interventional table. The right flank was prepped and draped in sterile fashion. Contrast was injected through the nephrostomy tube. The catheter was cut and removed over a Bentson wire. A 5 French Kumpe catheter was advanced into the distal ureter and contrast was injected. Bentson wire easily advanced into the urinary bladder. Bentson wire was exchanged for a stiff Amplatz wire. The 5 French catheter was removed over the wire and exchanged for a 7 [place] vascular sheath. A Bentson wire was placed through the 7 [place] vascular sheath as a safety wire. An 8 [place], 22 cm ureteral stent was selected. The stent was advanced over the stiff Amplatz wire. There was some resistance advancing the stent into the bladder but the stent was successfully placed in the bladder. The distal aspect of the stent reconstituted in the urinary bladder. The proximal aspect of stent was  reconstituted in the renal pelvis. Final nephrostogram was performed through the existing access. The stent was widely patent. The existing nephrostomy tube access and the safety wire were removed with fluoroscopy. Bandage placed at the old nephrostomy tube site. FINDINGS: Distal right ureter is patent. Right ureteral stent is well positioned within the urinary bladder and renal pelvis. IMPRESSION: Successful placement of an antegrade right ureteral stent through the existing right nephrostomy tube access. Nephrostomy tube access was completely removed at the end of the procedure. Electronically Signed   By: Markus Daft M.D.   On: 05/21/2017 12:18    ASSESSMENT & PLAN:   Prostate cancer [facility]) # Metastatic prostate cancer/ castrate sensitive [bulky retroperitoneal adenopathy; invasion into the bladder; rectum].  PET scan March 2019-pelvic/prostate uptake; diffuse bony uptake; without any bony lesions noted on CT imaging.  #Continue degarelix; response noted with improvement of PSA/also clinical improvement.  #Given the castrate sensitive disease-the recommendation was to start with docetaxel chemotherapy; however given multiple hospitalization/UTIs/age & borderline performance status-I would recommend addition of novel anti-hormonal therapy.  #Given poorly controlled diabetes/multiple UTIs-and the need for prednisone with Zytiga; I recommend use of Xtandi 4 pills a day.   #Again reviewed the potential side effects of Xtandi including fatigue/risk of seizures.   # poorly controlled Blood glucose-patient is on lantus/ novolo;  lantus 14-16 at night ; continue humalog scale.    # UTI- on ciprofloaxcin.  # bone mets on PET scan; no obvious CT correlate-hold any further imaging or biopsy  at this time- Hold x-geva-  given hypocalcemia.  # Elevated alkaline phosphatase-bony metastases versus others. Monitor for now.   # follow up in 1 month/degarelix; labs- cbc/cmp/psa.   All questions were  answered. The patient knows to call the clinic with any problems, questions or concerns.   [name], MD 05/30/2017 8:42 AM

## 2017-05-23 NOTE — Telephone Encounter (Signed)
Call returned to Ms Bricco and she states she has [facility] Nurse working on it as well

## 2017-05-23 NOTE — Telephone Encounter (Signed)
Pt daughter, [name], called stating pt had neph tube removed on Monday. Hina stated that Monday thing went well but starting Tuesday pt has developed gross hematuria in foley. Reinforced with Hina this is common due to foley in place and to increase fluid in take. Hina voiced understanding and requested a note be sent to Dr. Erlene Quan for further instructions. Please advise.

## 2017-05-24 NOTE — Telephone Encounter (Signed)
Made daughter aware. Daughter voiced understanding.

## 2017-05-25 ENCOUNTER — Other Ambulatory Visit: Payer: Self-pay

## 2017-05-25 NOTE — Patient Outreach (Signed)
[facility]) Care Management  05/25/2017  Michael [name] Mar 31, 1933 [id number]   EMMI:General discharge Referral date:05/14/17 Referral reason:Unfilled prescriptions: YES Day #1  No response from patient after 3 telephone calls and outreach letter attempt.  PLAN:  RNCM will close due to being unable to reach.   [name] RN,BSN,CCM [facility] Telephonic  [phone]

## 2017-06-01 ENCOUNTER — Encounter: Payer: Self-pay | Admitting: Cardiovascular Disease

## 2017-06-01 ENCOUNTER — Ambulatory Visit (INDEPENDENT_AMBULATORY_CARE_PROVIDER_SITE_OTHER): Payer: Medicare Other | Admitting: Cardiovascular Disease

## 2017-06-01 VITALS — BP 128/70 | HR 86 | Ht 66.0 in | Wt 126.0 lb

## 2017-06-01 DIAGNOSIS — I10 Essential (primary) hypertension: Secondary | ICD-10-CM | POA: Diagnosis not present

## 2017-06-01 DIAGNOSIS — E785 Hyperlipidemia, unspecified: Secondary | ICD-10-CM | POA: Diagnosis not present

## 2017-06-01 DIAGNOSIS — I2510 Atherosclerotic heart disease of native coronary artery without angina pectoris: Secondary | ICD-10-CM

## 2017-06-01 NOTE — Patient Instructions (Signed)
Medication Instructions: No change.   Labwork: None.   Procedures/Testing: None.   Follow-Up: 1 year with Dr. Fletcher Anon.   Any Additional Special Instructions Will Be Listed Below (If Applicable).     If you need a refill on your cardiac medications before your next appointment, please call your pharmacy.

## 2017-06-01 NOTE — Progress Notes (Signed)
Cardiology Office Note   Date:  06/01/2017   ID:  Michael [name], DOB January 10, 1934, MRN [id number]  PCP:  [name], MD  Cardiologist:   [name], MD   Chief Complaint  Patient presents with  . other    LS 2017 [name] complaints today . Meds reviewed verbally with pt.      History of Present Illness: Michael [name] is a 84 y.o. male who presents for a follow-up visit regarding moderate calcified nonobstructive one-vessel coronary artery disease.  He has chronic medical conditions that include type 2 diabetes, hypertension, mild carotid disease and  hyperlipidemia. Recent  He was seen in 2016 for abnormal stress test.  Cardiac catheterization was done in December 2016 which showed 40-50% heavily calcified stenosis in the mid to distal LAD. Ejection fraction was normal and left ventricular end-diastolic pressure was mildly elevated. The patient has been treated with atorvastatin. I have not seen him in more than a year and he was diagnosed last year with prostate cancer with multiple complications.  He is currently followed closely by oncology and urology.  He has an indwelling Foley catheter. He denies any chest pain, shortness of breath or palpitations.   Past Medical History:  Diagnosis Date  . Benign prostatic hypertrophy   . Bilateral carotid artery disease (HCC)    Mild plaque formation without obstructive disease noted on carotid Doppler.  . Coronary artery disease    Previous cardiac catheterization at [facility] in 2010. The patient was told about 2 blockages which did not require revascularization.  . Diabetes mellitus without complication ([place])   . Essential hypertension   . GERD (gastroesophageal reflux disease)   . Hyperlipidemia   . Hypertension   . Left carotid artery stenosis     Past Surgical History:  Procedure Laterality Date  . CARDIAC CATHETERIZATION  2010  . CARDIAC CATHETERIZATION N/A 02/03/2015   Procedure: Left Heart Cath and Coronary  Angiography;  Surgeon: Wellington [name], MD;  Location: [place] [facility];  Service: Cardiovascular;  Laterality: N/A;  . CATARACT EXTRACTION    . CYSTOSCOPY W/ RETROGRADES Bilateral 04/11/2017   Procedure: CYSTOSCOPY WITH RETROGRADE PYELOGRAM;  Surgeon: [name], MD;  Location: [facility];  Service: Urology;  Laterality: Bilateral;  . CYSTOSCOPY WITH STENT PLACEMENT Left 04/11/2017   Procedure: CYSTOSCOPY WITH STENT PLACEMENT and fulgeration;  Surgeon: [name], MD;  Location: [facility];  Service: Urology;  Laterality: Left;  . CYSTOSCOPY WITH URETHRAL DILATATION Bilateral 04/11/2017   Procedure: CYSTOSCOPY WITH URETHRAL DILATATION;  Surgeon: [name], MD;  Location: [facility];  Service: Urology;  Laterality: Bilateral;  . IR CONVERT RIGHT NEPHROSTOMY TO NEPHROURETERAL CATH  05/21/2017  . IR NEPHROSTOMY PLACEMENT RIGHT  04/13/2017  . PROSTATECTOMY       Current Outpatient Medications  Medication Sig Dispense Refill  . acetaminophen (TYLENOL) 500 MG tablet Take 500 mg by mouth every 4 (four) hours as needed for moderate pain or fever.    [name] albuterol (PROVENTIL HFA;VENTOLIN HFA) 108 (90 Base) MCG/ACT inhaler Inhale 2 puffs into the lungs every 6 (six) hours as needed for wheezing or shortness of breath. 1 Inhaler 2  . atorvastatin (LIPITOR) 10 MG tablet Take 10 mg by mouth daily.    . Calcium Carb-Cholecalciferol (CALCIUM 600+D3) 600-800 MG-UNIT TABS Take 1 tablet by mouth daily.    . cetirizine (ZYRTEC) 10 MG tablet Take 1 tablet (10 mg total) by mouth daily. 30 tablet 6  . enzalutamide (XTANDI) 40  MG capsule Take 4 capsules (160 mg total) by mouth daily. 120 capsule 0  . feeding supplement, GLUCERNA SHAKE, (GLUCERNA SHAKE) LIQD Take 237 mLs by mouth 3 (three) times daily between meals. 90 Can 5  . glucose blood (ACCU-CHEK GUIDE) test strip Use asdirected three times a day diag E11.65 100 each 3  . ibuprofen (ADVIL,MOTRIN) 200 MG tablet Take 200 mg by mouth every 4 (four) hours as  needed for fever or moderate pain.    [name] insulin glargine (LANTUS) 100 UNIT/ML injection Inject 14-16 Units into the skin at bedtime. Based on sugar levels in am.    . insulin lispro (HUMALOG) 100 UNIT/ML injection Inject 0-10 Units into the skin 3 (three) times daily before meals. <100 = none 100-140 = 0 141-180 = 3u 181-220 = 5-7u 221-260 = 6-9u 261-300 = 7u 301-340 = 8u 341+ = 10u  May be more or less administered depending upon glucose reading    . Insulin Syringe-Needle U-100 (B-D INS SYR ULTRAFINE 1CC/31G) 31G X 5/16" 1 ML MISC Use as directed 100 each 0  . lisinopril (PRINIVIL,ZESTRIL) 2.5 MG tablet Take 2.5 mg by mouth daily.  4  . metoprolol succinate (TOPROL-XL) 25 MG 24 hr tablet Take 25 mg by mouth daily.   6  . Multiple Vitamins-Minerals (MULTIVITAMIN WITH MINERALS) tablet Take 1 tablet by mouth daily.     [name] omeprazole (PRILOSEC) 20 MG capsule Take 20 mg by mouth daily.  1  . ondansetron (ZOFRAN) 8 MG tablet One pill every 8 hours as needed for nausea/vomitting. 40 tablet 1  . prochlorperazine (COMPAZINE) 10 MG tablet Take 1 tablet (10 mg total) by mouth every 6 (six) hours as needed for nausea or vomiting. 40 tablet 1   [name] current facility-administered medications for this visit.     Allergies:   Patient has [name] known allergies.    Social History:  The patient  reports that he has never smoked. He has never used smokeless tobacco. He reports that he does not drink alcohol or use drugs.   Family History:  The patient's family history includes Hypertension in his father.    ROS:  Please see the history of present illness.   Otherwise, review of systems are positive for none.   All other systems are reviewed and negative.    PHYSICAL EXAM: VS:  BP 128/70 (BP Location: Left Arm, Patient Position: Sitting, Cuff Size: Normal)   Pulse 86   Ht 5\' 6"  (1.676 m)   Wt 126 lb (57.2 kg)   BMI 20.34 kg/m  , BMI Body mass index is 20.34 kg/m. GEN: Well nourished, well developed,  in [name] acute distress  HEENT: normal  Neck: [name] JVD, carotid bruits, or masses Cardiac: RRR; [name] murmurs, rubs, or gallops,[name] edema  Respiratory:  clear to auscultation bilaterally, normal work of breathing GI: soft, nontender, nondistended, + BS MS: [name] deformity or atrophy  Skin: warm and dry, [name] rash Neuro:  Strength and sensation are intact Psych: euthymic mood, full affect   EKG:  EKG is ordered today. The ekg ordered today demonstrates normal sinus rhythm with [name] significant ST or T wave changes.   Recent Labs: 05/17/2017: ALT 33 05/23/2017: BUN 23; Creatinine, Ser 0.84; Hemoglobin 10.0; Platelets 444; Potassium 5.2; Sodium 134    Lipid Panel [name] results found for: CHOL, TRIG, HDL, CHOLHDL, VLDL, LDLCALC, LDLDIRECT    Wt Readings from Last 3 Encounters:  06/01/17 126 lb (57.2 kg)  05/23/17 127 lb (57.6 kg)  05/22/17 127 lb 9.6 oz (57.9 kg)       [name] flowsheet data found.    ASSESSMENT AND PLAN:  1.  Coronary artery disease involving native coronary arteries without angina: He is doing well overall from a cardiac standpoint with [name] anginal symptoms.  Recommend continuing medical therapy.  He is currently not on any antiplatelet medication which I think is reasonable for now given issues with hematuria and cancer related complications.  2.  Hyperlipidemia: Continue treatment with atorvastatin with a target LDL of less than 70.  3.  Essential hypertension: Blood pressure is controlled on current medications.  4.  Diabetes mellitus: He is working on better glycemic control.  5.  Metastatic prostate cancer: Invasion into the bladder and rectum.  Managed by oncology and urology.    Disposition:   FU with me in 1 year  Signed,  [name], MD  06/01/2017 11:19 AM    [place]

## 2017-06-04 ENCOUNTER — Telehealth: Payer: Self-pay

## 2017-06-04 DIAGNOSIS — Z436 Encounter for attention to other artificial openings of urinary tract: Secondary | ICD-10-CM | POA: Diagnosis not present

## 2017-06-04 DIAGNOSIS — E1151 Type 2 diabetes mellitus with diabetic peripheral angiopathy without gangrene: Secondary | ICD-10-CM | POA: Diagnosis not present

## 2017-06-04 DIAGNOSIS — C61 Malignant neoplasm of prostate: Secondary | ICD-10-CM | POA: Diagnosis not present

## 2017-06-04 DIAGNOSIS — N1330 Unspecified hydronephrosis: Secondary | ICD-10-CM | POA: Diagnosis not present

## 2017-06-04 DIAGNOSIS — Z466 Encounter for fitting and adjustment of urinary device: Secondary | ICD-10-CM | POA: Diagnosis not present

## 2017-06-04 DIAGNOSIS — N320 Bladder-neck obstruction: Secondary | ICD-10-CM | POA: Diagnosis not present

## 2017-06-04 NOTE — Telephone Encounter (Signed)
Sarah, home health nurse, called requesting to get a cath specimen for ucx. Judson Roch stated that daughter works at [employer] and performed a u/a over the weekend that had TNTC WBC. Judson Roch stated that pt looks pretty good with no UTI s/s other than dark cloudy urine. Please advise.

## 2017-06-04 NOTE — Telephone Encounter (Signed)
Spoke with Judson Roch again who stated family was concerned about the u/a results. Reinforced with Judson Roch that pt is not having UTI s/s and has foley/bilateral stents therefore he will be colonized. Judson Roch voiced understanding stating she would make the family aware.

## 2017-06-04 NOTE — Telephone Encounter (Signed)
It is not at all clear to me why we are checking urine.  Is he having symptoms of UTI?  He has bilateral stents and a Foley catheter.  He will be chronically colonized.  [name], MD

## 2017-06-09 ENCOUNTER — Other Ambulatory Visit: Payer: Self-pay | Admitting: Internal Medicine

## 2017-06-11 ENCOUNTER — Telehealth: Payer: Self-pay | Admitting: Internal Medicine

## 2017-06-11 ENCOUNTER — Telehealth: Payer: Self-pay | Admitting: *Deleted

## 2017-06-11 ENCOUNTER — Other Ambulatory Visit: Payer: Self-pay | Admitting: Internal Medicine

## 2017-06-11 DIAGNOSIS — C61 Malignant neoplasm of prostate: Secondary | ICD-10-CM

## 2017-06-11 NOTE — Telephone Encounter (Signed)
Please return call to Janett Billow at [facility] regarding medical clearance  [phone]

## 2017-06-11 NOTE — Telephone Encounter (Signed)
SIGNED ORDER FOR PT PUT INTO [facility] FOLDER TO BE PICKED UP.JW

## 2017-06-11 NOTE — Progress Notes (Signed)
[facility] [place], [place] [address]  Internal MEDICINE  Office Visit Note  Patient Name: Michael [name]  [id number]  [id number]  Date of Service: 06/11/2017  Chief Complaint  Patient presents with  . Diabetes  . Back Pain  . Hospialization f/u    HPI  Pt is here for routine follow up. Pt had a recent dx of prostatic metastatic cancer. His blood sugar has been fluctuating. All oral hypoglycemics have been stopped. Pt has been given samples of basalgar and Novolog SS   Current Medication: Outpatient Encounter Medications as of 05/22/2017  Medication Sig Note  . acetaminophen (TYLENOL) 500 MG tablet Take 500 mg by mouth every 4 (four) hours as needed for moderate pain or fever.   [name] albuterol (PROVENTIL HFA;VENTOLIN HFA) 108 (90 Base) MCG/ACT inhaler Inhale 2 puffs into the lungs every 6 (six) hours as needed for wheezing or shortness of breath.   [name] atorvastatin (LIPITOR) 10 MG tablet Take 10 mg by mouth daily.   . Calcium Carb-Cholecalciferol (CALCIUM 600+D3) 600-800 MG-UNIT TABS Take 1 tablet by mouth daily.   . cetirizine (ZYRTEC) 10 MG tablet Take 1 tablet (10 mg total) by mouth daily.   . [EXPIRED] ciprofloxacin (CIPRO) 500 MG tablet Take 1 tablet (500 mg total) by mouth 2 (two) times daily for 10 days.   . feeding supplement, GLUCERNA SHAKE, (GLUCERNA SHAKE) LIQD Take 237 mLs by mouth 3 (three) times daily between meals.   [name] glucose blood (ACCU-CHEK GUIDE) test strip Use asdirected three times a day diag E11.65   . ibuprofen (ADVIL,MOTRIN) 200 MG tablet Take 200 mg by mouth every 4 (four) hours as needed for fever or moderate pain.   [name] insulin glargine (LANTUS) 100 UNIT/ML injection Inject 14-16 Units into the skin at bedtime. Based on sugar levels in am.   . insulin lispro (HUMALOG) 100 UNIT/ML injection Inject 0-10 Units into the skin 3 (three) times daily before meals. <100 = none 100-140 = 0 141-180 = 3u 181-220 = 5-7u 221-260 = 6-9u 261-300  = 7u 301-340 = 8u 341+ = 10u  May be more or less administered depending upon glucose reading   . Insulin Syringe-Needle U-100 (B-D INS SYR ULTRAFINE 1CC/31G) 31G X 5/16" 1 ML MISC Use as directed   . lisinopril (PRINIVIL,ZESTRIL) 2.5 MG tablet Take 2.5 mg by mouth daily.   . metoprolol succinate (TOPROL-XL) 25 MG 24 hr tablet Take 25 mg by mouth daily.    . Multiple Vitamins-Minerals (MULTIVITAMIN WITH MINERALS) tablet Take 1 tablet by mouth daily.    [name] omeprazole (PRILOSEC) 20 MG capsule Take 20 mg by mouth daily.   . ondansetron (ZOFRAN) 8 MG tablet One pill every 8 hours as needed for nausea/vomitting.   . prochlorperazine (COMPAZINE) 10 MG tablet Take 1 tablet (10 mg total) by mouth every 6 (six) hours as needed for nausea or vomiting.   . [DISCONTINUED] enzalutamide (XTANDI) 40 MG capsule Take 4 capsules (160 mg total) by mouth daily.   . [DISCONTINUED] finasteride (PROSCAR) 5 MG tablet Take 5 mg by mouth daily. 05/23/2017: Currently on hold  . [DISCONTINUED] 0.9 %  sodium chloride infusion     No facility-administered encounter medications on file as of 05/22/2017.     Surgical History: Past Surgical History:  Procedure Laterality Date  . CARDIAC CATHETERIZATION  2010  . CARDIAC CATHETERIZATION N/A 02/03/2015   Procedure: Left Heart Cath and Coronary Angiography;  Surgeon: Wellington [name], MD;  Location: [facility]  INVASIVE CV LAB;  Service: Cardiovascular;  Laterality: N/A;  . CATARACT EXTRACTION    . CYSTOSCOPY W/ RETROGRADES Bilateral 04/11/2017   Procedure: CYSTOSCOPY WITH RETROGRADE PYELOGRAM;  Surgeon: [name], MD;  Location: [facility] ORS;  Service: Urology;  Laterality: Bilateral;  . CYSTOSCOPY WITH STENT PLACEMENT Left 04/11/2017   Procedure: CYSTOSCOPY WITH STENT PLACEMENT and fulgeration;  Surgeon: [name], MD;  Location: [facility] ORS;  Service: Urology;  Laterality: Left;  . CYSTOSCOPY WITH URETHRAL DILATATION Bilateral 04/11/2017   Procedure: CYSTOSCOPY WITH URETHRAL  DILATATION;  Surgeon: [name], MD;  Location: [facility] ORS;  Service: Urology;  Laterality: Bilateral;  . IR CONVERT RIGHT NEPHROSTOMY TO NEPHROURETERAL CATH  05/21/2017  . IR NEPHROSTOMY PLACEMENT RIGHT  04/13/2017  . PROSTATECTOMY      Medical History: Past Medical History:  Diagnosis Date  . Benign prostatic hypertrophy   . Bilateral carotid artery disease (HCC)    Mild plaque formation without obstructive disease noted on carotid Doppler.  . Coronary artery disease    Previous cardiac catheterization at [facility] in 2010. The patient was told about 2 blockages which did not require revascularization.  . Diabetes mellitus without complication ([place])   . Essential hypertension   . GERD (gastroesophageal reflux disease)   . Hyperlipidemia   . Hypertension   . Left carotid artery stenosis     Family History: Family History  Problem Relation Age of Onset  . Hypertension Father     Social History   Socioeconomic History  . Marital status: Widowed    Spouse name: Not on file  . Number of children: Not on file  . Years of education: Not on file  . Highest education level: Not on file  Occupational History  . Not on file  Social Needs  . Financial resource strain: Not on file  . Food insecurity:    Worry: Not on file    Inability: Not on file  . Transportation needs:    Medical: Not on file    Non-medical: Not on file  Tobacco Use  . Smoking status: Never Smoker  . Smokeless tobacco: Never Used  Substance and Sexual Activity  . Alcohol use: No  . Drug use: No  . Sexual activity: Not on file  Lifestyle  . Physical activity:    Days per week: Not on file    Minutes per session: Not on file  . Stress: Not on file  Relationships  . Social connections:    Talks on phone: Not on file    Gets together: Not on file    Attends religious service: Not on file    Active member of club or organization: Not on file    Attends meetings of clubs or organizations: Not on file     Relationship status: Not on file  . Intimate partner violence:    Fear of current or ex partner: Not on file    Emotionally abused: Not on file    Physically abused: Not on file    Forced sexual activity: Not on file  Other Topics Concern  . Not on file  Social History Narrative  . Not on file   Review of Systems  Constitutional: Negative for chills, fatigue and unexpected weight change.  HENT: Positive for postnasal drip. Negative for congestion, rhinorrhea, sneezing and sore throat.   Eyes: Negative for redness.  Respiratory: Positive for shortness of breath. Negative for cough and chest tightness.   Cardiovascular: Negative for chest pain and palpitations.  Gastrointestinal:  Negative for abdominal pain, constipation, diarrhea, nausea and vomiting.  Endocrine:       Fluctuating numbers   Genitourinary: Negative for dysuria and frequency.  Musculoskeletal: Negative for arthralgias, back pain, joint swelling and neck pain.  Skin: Negative for rash.  Neurological: Negative.  Negative for tremors and numbness.  Hematological: Negative for adenopathy. Does not bruise/bleed easily.  Psychiatric/Behavioral: Negative for behavioral problems (Depression), sleep disturbance and suicidal ideas. The patient is not nervous/anxious.     Vital Signs: BP 140/84 (BP Location: Left Arm, Patient Position: Sitting)   Pulse 100   Resp 16   Ht 5\' 6"  (1.676 m)   Wt 127 lb 9.6 oz (57.9 kg)   SpO2 98%   BMI 20.60 kg/m    Physical Exam  Constitutional: He is oriented to person, place, and time. He appears well-developed and well-nourished.  HENT:  Head: Normocephalic and atraumatic.  Eyes: EOM are normal.  Neck: Neck supple.  Cardiovascular: Normal rate and regular rhythm.  Pulmonary/Chest: Effort normal and breath sounds normal.  Abdominal: Soft. Bowel sounds are normal.  Musculoskeletal: He exhibits tenderness.  Neurological: He is alert and oriented to person, place, and time.  Skin:  Skin is warm and dry.  Psychiatric: He has a normal mood and affect.   Assessment/Plan: 1. Uncontrolled type 2 diabetes mellitus with hyperglycemia (HCC) - Continue SS with Lantus ( basalar and Novolog samples given, son in law in the room  2. Metastatic malignant neoplasm to prostate [facility]) - Per Oncology 3. Weakness generalized - Improving   General Counseling: Michael [name] verbalizes understanding of the findings of todays visit and agrees with plan of treatment. I have discussed any further diagnostic evaluation that may be needed or ordered today. We also reviewed his medications today. he has been encouraged to call the office with any questions or concerns that should arise related to todays visit.    Time spent:25 Minutes     Dr [name] Internal medicine

## 2017-06-11 NOTE — Telephone Encounter (Signed)
Michael [name] informed and she asked that something be faxed in writing so I printed note and faxed to her at [phone]

## 2017-06-11 NOTE — Telephone Encounter (Signed)
Per Dr. Dietrich Pates should not have any dental work performed. He has an apt next Monday. Md states pt must wait until this apt to further discuss.

## 2017-06-12 MED FILL — XTANDI 40 MG CAPSULE: 40 | 30 days supply | Qty: 120 | Fill #0

## 2017-06-13 ENCOUNTER — Ambulatory Visit: Payer: Medicare Other | Admitting: Urology

## 2017-06-15 ENCOUNTER — Encounter: Payer: Self-pay | Admitting: Urology

## 2017-06-15 ENCOUNTER — Ambulatory Visit (INDEPENDENT_AMBULATORY_CARE_PROVIDER_SITE_OTHER): Payer: Medicare Other | Admitting: Urology

## 2017-06-15 VITALS — BP 132/77 | HR 108 | Ht 66.0 in | Wt 128.0 lb

## 2017-06-15 DIAGNOSIS — D649 Anemia, unspecified: Secondary | ICD-10-CM | POA: Diagnosis not present

## 2017-06-15 DIAGNOSIS — Z9289 Personal history of other medical treatment: Secondary | ICD-10-CM | POA: Diagnosis not present

## 2017-06-15 DIAGNOSIS — E7800 Pure hypercholesterolemia, unspecified: Secondary | ICD-10-CM | POA: Insufficient documentation

## 2017-06-15 DIAGNOSIS — N320 Bladder-neck obstruction: Secondary | ICD-10-CM | POA: Diagnosis not present

## 2017-06-15 DIAGNOSIS — N1330 Unspecified hydronephrosis: Secondary | ICD-10-CM | POA: Diagnosis not present

## 2017-06-15 DIAGNOSIS — E119 Type 2 diabetes mellitus without complications: Secondary | ICD-10-CM | POA: Diagnosis not present

## 2017-06-15 DIAGNOSIS — C61 Malignant neoplasm of prostate: Secondary | ICD-10-CM

## 2017-06-15 DIAGNOSIS — I2510 Atherosclerotic heart disease of native coronary artery without angina pectoris: Secondary | ICD-10-CM

## 2017-06-15 DIAGNOSIS — Z794 Long term (current) use of insulin: Secondary | ICD-10-CM | POA: Diagnosis not present

## 2017-06-15 DIAGNOSIS — E1151 Type 2 diabetes mellitus with diabetic peripheral angiopathy without gangrene: Secondary | ICD-10-CM | POA: Diagnosis not present

## 2017-06-15 DIAGNOSIS — Z436 Encounter for attention to other artificial openings of urinary tract: Secondary | ICD-10-CM | POA: Diagnosis not present

## 2017-06-15 DIAGNOSIS — Z466 Encounter for fitting and adjustment of urinary device: Secondary | ICD-10-CM | POA: Diagnosis not present

## 2017-06-15 DIAGNOSIS — R6889 Other general symptoms and signs: Secondary | ICD-10-CM | POA: Diagnosis not present

## 2017-06-15 DIAGNOSIS — Z7689 Persons encountering health services in other specified circumstances: Secondary | ICD-10-CM | POA: Diagnosis not present

## 2017-06-15 DIAGNOSIS — D500 Iron deficiency anemia secondary to blood loss (chronic): Secondary | ICD-10-CM | POA: Diagnosis not present

## 2017-06-15 DIAGNOSIS — I10 Essential (primary) hypertension: Secondary | ICD-10-CM | POA: Diagnosis not present

## 2017-06-15 NOTE — Progress Notes (Signed)
Cath Change/ Replacement  Patient is present today for a catheter change due to urinary retention.  11ml of water was removed from the balloon, a 16FR foley cath was removed with out difficulty.  Patient was cleaned and prepped in a sterile fashion with betadine and 2% lidocaine jelly was instilled into the urethra. A 16 coude FR foley cath was replaced into the bladder no complications were noted Urine return was noted 16ml and urine was clear yellow in color. The balloon was filled with 49ml of sterile water. A night bag was attached for drainage.  A night bag was also given to the patient and patient was given instruction on how to change from one bag to another. Patient was given proper instruction on catheter care.    Preformed by: Fonnie Jarvis, CMA  Follow up: 1 month

## 2017-06-15 NOTE — Progress Notes (Signed)
   06/15/17  CC:  Chief Complaint  Patient presents with  . Urinary Retention    1 month    HPI: 84 year old male with metastatic prostate cancer with bulky retroperitoneal adenopathy, visceral, and bone disease who returns today for Foley catheter exchange.  He was initially seen and evaluated during a hospital admission with urinary retention, acute kidney injury with bilateral hydronephrosis.  He underwent biopsy of his bladder neck revealing prostate cancer and started on ADT.  He required surgical intervention for Foley catheter placement given severe bladder neck contracture at which time a left ureteral stent was placed and ultimately a right PCN.  He has since undergone internalization of his right PCN tube.  He is now under the care of Dr. Rogue Bussing and on ADT/ Gillermina Phy.   Blood pressure 132/77, pulse (!) 108, height 5\' 6"  (1.676 m), weight 128 lb (58.1 kg). NED. A&Ox3.  Accompanied by daughter today. No respiratory distress .   Abd soft, NT, ND Normal phallus with bilateral descended testicles.  Foley catheter in place draining cloudy urine.  Assessment/ Plan:  1. Prostate cancer [facility]) Advanced metastatic prostate cancer on ADT scheduled to begin chemotherapy today under the care of Dr. Rogue Bussing  2. Bilateral hydronephrosis Bilateral indwelling ureteral stents We will discuss strategies for stent exchange versus stent removal pending the radiographic response to ADT/chemo  3. Bladder neck contracture Successful exchange of 16 French Foley today Will maintain Foley with similar plan as above, reevaluate possible Foley removal pending follow-up imaging Return to care in 4 weeks for catheter exchange Discussed option of CIC, declined   Return in about 1 month (around 07/13/2017), or Foley exchange.   [name], MD

## 2017-06-18 ENCOUNTER — Inpatient Hospital Stay: Payer: Medicare Other

## 2017-06-18 ENCOUNTER — Other Ambulatory Visit: Payer: Self-pay

## 2017-06-18 ENCOUNTER — Encounter: Payer: Self-pay | Admitting: Internal Medicine

## 2017-06-18 ENCOUNTER — Inpatient Hospital Stay (HOSPITAL_BASED_OUTPATIENT_CLINIC_OR_DEPARTMENT_OTHER): Payer: Medicare Other | Admitting: Internal Medicine

## 2017-06-18 VITALS — BP 157/82 | HR 93 | Temp 95.9°F | Resp 18 | Wt 128.6 lb

## 2017-06-18 DIAGNOSIS — I2510 Atherosclerotic heart disease of native coronary artery without angina pectoris: Secondary | ICD-10-CM | POA: Diagnosis not present

## 2017-06-18 DIAGNOSIS — Z5111 Encounter for antineoplastic chemotherapy: Secondary | ICD-10-CM | POA: Diagnosis not present

## 2017-06-18 DIAGNOSIS — R748 Abnormal levels of other serum enzymes: Secondary | ICD-10-CM | POA: Diagnosis not present

## 2017-06-18 DIAGNOSIS — K219 Gastro-esophageal reflux disease without esophagitis: Secondary | ICD-10-CM

## 2017-06-18 DIAGNOSIS — I10 Essential (primary) hypertension: Secondary | ICD-10-CM

## 2017-06-18 DIAGNOSIS — E785 Hyperlipidemia, unspecified: Secondary | ICD-10-CM

## 2017-06-18 DIAGNOSIS — Z794 Long term (current) use of insulin: Secondary | ICD-10-CM

## 2017-06-18 DIAGNOSIS — C61 Malignant neoplasm of prostate: Secondary | ICD-10-CM

## 2017-06-18 DIAGNOSIS — Z191 Hormone sensitive malignancy status: Secondary | ICD-10-CM | POA: Diagnosis not present

## 2017-06-18 DIAGNOSIS — E1165 Type 2 diabetes mellitus with hyperglycemia: Secondary | ICD-10-CM

## 2017-06-18 DIAGNOSIS — C7951 Secondary malignant neoplasm of bone: Secondary | ICD-10-CM | POA: Diagnosis not present

## 2017-06-18 DIAGNOSIS — Z79899 Other long term (current) drug therapy: Secondary | ICD-10-CM

## 2017-06-18 DIAGNOSIS — E119 Type 2 diabetes mellitus without complications: Secondary | ICD-10-CM | POA: Diagnosis not present

## 2017-06-18 DIAGNOSIS — N139 Obstructive and reflux uropathy, unspecified: Secondary | ICD-10-CM | POA: Diagnosis not present

## 2017-06-18 LAB — CBC WITH DIFFERENTIAL/PLATELET
Basophils Absolute: 0 10*3/uL (ref 0–0.1)
Basophils Relative: 1 %
Eosinophils Absolute: 0.1 10*3/uL (ref 0–0.7)
Eosinophils Relative: 1 %
HCT: 30.3 % — ABNORMAL LOW (ref 40.0–52.0)
Hemoglobin: 10.4 g/dL — ABNORMAL LOW (ref 13.0–18.0)
Lymphocytes Relative: 20 %
Lymphs Abs: 1.4 10*3/uL (ref 1.0–3.6)
MCH: 29.6 pg (ref 26.0–34.0)
MCHC: 34.2 g/dL (ref 32.0–36.0)
MCV: 86.8 fL (ref 80.0–100.0)
Monocytes Absolute: 0.7 10*3/uL (ref 0.2–1.0)
Monocytes Relative: 9 %
Neutro Abs: 4.8 10*3/uL (ref 1.4–6.5)
Neutrophils Relative %: 69 %
Platelets: 295 10*3/uL (ref 150–440)
RBC: 3.5 MIL/uL — ABNORMAL LOW (ref 4.40–5.90)
RDW: 18.3 % — ABNORMAL HIGH (ref 11.5–14.5)
WBC: 7 10*3/uL (ref 3.8–10.6)

## 2017-06-18 LAB — COMPREHENSIVE METABOLIC PANEL
ALT: 16 U/L — ABNORMAL LOW (ref 17–63)
AST: 21 U/L (ref 15–41)
Albumin: 3.4 g/dL — ABNORMAL LOW (ref 3.5–5.0)
Alkaline Phosphatase: 341 U/L — ABNORMAL HIGH (ref 38–126)
Anion gap: 9 (ref 5–15)
BUN: 27 mg/dL — ABNORMAL HIGH (ref 6–20)
CO2: 21 mmol/L — ABNORMAL LOW (ref 22–32)
Calcium: 8.7 mg/dL — ABNORMAL LOW (ref 8.9–10.3)
Chloride: 102 mmol/L (ref 101–111)
Creatinine, Ser: 1.03 mg/dL (ref 0.61–1.24)
GFR calc Af Amer: >60 mL/min (ref >60)
GFR calc non Af Amer: >60 mL/min (ref >60)
Glucose, Bld: 288 mg/dL — ABNORMAL HIGH (ref 65–99)
Potassium: 4.9 mmol/L (ref 3.5–5.1)
Sodium: 132 mmol/L — ABNORMAL LOW (ref 135–145)
Total Bilirubin: 0.4 mg/dL (ref 0.3–1.2)
Total Protein: 7.4 g/dL (ref 6.5–8.1)

## 2017-06-18 LAB — PSA: Prostatic Specific Antigen: 0.23 ng/mL (ref 0.00–4.00)

## 2017-06-18 MED ORDER — DEGARELIX ACETATE 80 MG ~~LOC~~ SOLR
80.0000 mg | Freq: Once | SUBCUTANEOUS | Status: AC
  Administered 2017-06-18: 80 mg via SUBCUTANEOUS
  Filled 2017-06-18: qty 4

## 2017-06-18 NOTE — Assessment & Plan Note (Addendum)
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  PET scan March 2019-pelvic/prostate uptake; diffuse bony uptake; without any bony lesions noted on CT imaging.  # Currently on degarelix and [name] [April 2nd 2019] response noted with improvement of PSA/also clinical improvement.  No major side effects noted.  #Obstructive uropathy-bilateral ureteral stents; right side nephrostomy tube-explanted.  Continues to have Foley catheter-given outflow obstruction; will check with urology regarding timing of imaging/which could help make decisions regarding take the Foley catheter out.  # poorly controlled Blood glucose-patient is on lantus/ novolo; improving- 140-150s; increase lantus to 16 at night ; continue humalog scale.    #Bone mets on PET scan; no obvious CT correlate-hold any further imaging or biopsy at this time- Hold x-geva-  given hypocalcemia- ca- 8.7 today.   # Elevated alkaline phosphatase-bony metastases- improving.  # shot today; follow up in 1 months/labs- shot. Will check with Dr.Brandon re: imaging timing.

## 2017-06-18 NOTE — Progress Notes (Signed)
Nutrition Follow-up:  Patient with metastatic prostate cancer.  Patient taking xtandi currently.  Followed by Dr. Judie Bonus with patient and daughter today following MD appointment.  Daughter reports patient appetite is good.  Reports he is eating small frequent meals.  Drinking mostly premier protein shakes 1-2 per day.  Patient also enjoys lentils, peanut butter, nuts and seeds.  Daughter prepares meals and encourages patient to eat.   Daughter concerned about blood glucose and has appointment to see endocrinology in June.     Medications: reviewed  Labs: reviewed  Anthropometrics:   Weight stable at 128 lb 9.6 oz today, has been steady at 127 lb for the last month.   UBW of 130-140s   NUTRITION DIAGNOSIS: Unintentional weight loss stable   INTERVENTION:   Discussed good sources of protein and discussed qunioa as option, pea protein powder.  Provided daughter with protein goal per day.   Encouraged daughter to continue shakes for added protein and calories.     MONITORING, EVALUATION, GOAL: weight trends, intake, blood glucose   NEXT VISIT: as needed  Nehal Witting B. Zenia Resides, [place], [place] Registered Dietitian [phone] (pager)

## 2017-06-18 NOTE — Progress Notes (Signed)
[facility] NOTE  Patient Care Team: [name], MD as PCP - General (Internal Medicine)  CHIEF COMPLAINTS/PURPOSE OF CONSULTATION:  Metastatic prostate cancer  #  Oncology History   # FEB 2019-Metastatic prostate cancer/ castrate sensitive [bulky retroperitoneal adenopathy; invasion into the bladder; rectum] Bladder Bx- prostate. On Degarelix  # April 3rd 2019- [name] [147m/d]  # Poorly controlled DM-on insulin  # s/p right PCN/ Foley cath [Dr.Brandon]; multiple UTIs.     Prostate cancer ([place])     HISTORY OF PRESENTING ILLNESS:  Michael [name] 84 y.o.  male with a recently diagnosed metastatic cancer sensitive prostate cancer is here for follow-up.    In the interim patient has not had any hospitalizations.  No recent UTIs.   He was recently evaluated by urology; change the Foley catheter.  He continues to have internal stents in the ureters.  His appetite is improving.  He is more active at home.  He denies any chest pain or shortness of breath or cough.  Blood sugars overall better controlled; however more recently noted to have fasting blood glucose around 140s to 150s.  Denies any fatigue.  Denies any seizures.  ROS: A complete 10 point review of system is done which is negative except mentioned above in history of present illness  MEDICAL HISTORY:  Past Medical History:  Diagnosis Date  . Benign prostatic hypertrophy   . Bilateral carotid artery disease (HCC)    Mild plaque formation without obstructive disease noted on carotid Doppler.  . Coronary artery disease    Previous cardiac catheterization at [facility] in 2010. The patient was told about 2 blockages which did not require revascularization.  . Diabetes mellitus without complication ([facility])   . Essential hypertension   . GERD (gastroesophageal reflux disease)   . Hyperlipidemia   . Hypertension   . Left carotid artery stenosis     SURGICAL HISTORY: Past Surgical History:   Procedure Laterality Date  . CARDIAC CATHETERIZATION  2010  . CARDIAC CATHETERIZATION N/A 02/03/2015   Procedure: Left Heart Cath and Coronary Angiography;  Surgeon: Wellington [name], MD;  Location: [facility];  Service: Cardiovascular;  Laterality: N/A;  . CATARACT EXTRACTION    . CYSTOSCOPY W/ RETROGRADES Bilateral 04/11/2017   Procedure: CYSTOSCOPY WITH RETROGRADE PYELOGRAM;  Surgeon: [name], MD;  Location: [facility];  Service: Urology;  Laterality: Bilateral;  . CYSTOSCOPY WITH STENT PLACEMENT Left 04/11/2017   Procedure: CYSTOSCOPY WITH STENT PLACEMENT and fulgeration;  Surgeon: [name], MD;  Location: [facility];  Service: Urology;  Laterality: Left;  . CYSTOSCOPY WITH URETHRAL DILATATION Bilateral 04/11/2017   Procedure: CYSTOSCOPY WITH URETHRAL DILATATION;  Surgeon: [name], MD;  Location: [facility];  Service: Urology;  Laterality: Bilateral;  . IR CONVERT RIGHT NEPHROSTOMY TO NEPHROURETERAL CATH  05/21/2017  . IR NEPHROSTOMY PLACEMENT RIGHT  04/13/2017  . PROSTATECTOMY      SOCIAL HISTORY: Social History   Socioeconomic History  . Marital status: Widowed    Spouse name: Not on file  . Number of children: Not on file  . Years of education: Not on file  . Highest education level: Not on file  Occupational History  . Not on file  Social Needs  . Financial resource strain: Not on file  . Food insecurity:    Worry: Not on file    Inability: Not on file  . Transportation needs:    Medical: Not on file    Non-medical: Not on file  Tobacco Use  . Smoking status: Never Smoker  . Smokeless tobacco: Never Used  Substance and Sexual Activity  . Alcohol use: No  . Drug use: No  . Sexual activity: Not on file  Lifestyle  . Physical activity:    Days per week: Not on file    Minutes per session: Not on file  . Stress: Not on file  Relationships  . Social connections:    Talks on phone: Not on file    Gets together: Not on file    Attends religious  service: Not on file    Active member of club or organization: Not on file    Attends meetings of clubs or organizations: Not on file    Relationship status: Not on file  . Intimate partner violence:    Fear of current or ex partner: Not on file    Emotionally abused: Not on file    Physically abused: Not on file    Forced sexual activity: Not on file  Other Topics Concern  . Not on file  Social History Narrative  . Not on file    FAMILY HISTORY: Family History  Problem Relation Age of Onset  . Hypertension Father     ALLERGIES:  has No Known Allergies.  MEDICATIONS:  Current Outpatient Medications  Medication Sig Dispense Refill  . atorvastatin (LIPITOR) 10 MG tablet Take 10 mg by mouth daily.    . Calcium Carb-Cholecalciferol (CALCIUM 600+D3) 600-800 MG-UNIT TABS Take 1 tablet by mouth daily.    [name] glucose blood (ACCU-CHEK GUIDE) test strip Use asdirected three times a day diag E11.65 100 each 3  . insulin glargine (LANTUS) 100 UNIT/ML injection Inject 14-16 Units into the skin at bedtime. Based on sugar levels in am.    . insulin lispro (HUMALOG) 100 UNIT/ML injection Inject 0-10 Units into the skin 3 (three) times daily before meals. <100 = none 100-140 = 0 141-180 = 3u 181-220 = 5-7u 221-260 = 6-9u 261-300 = 7u 301-340 = 8u 341+ = 10u  May be more or less administered depending upon glucose reading    . lisinopril (PRINIVIL,ZESTRIL) 2.5 MG tablet Take 2.5 mg by mouth daily.  4  . metoprolol succinate (TOPROL-XL) 25 MG 24 hr tablet Take 25 mg by mouth daily.   6  . Multiple Vitamins-Minerals (MULTIVITAMIN WITH MINERALS) tablet Take 1 tablet by mouth daily.     [name] omeprazole (PRILOSEC) 20 MG capsule Take 20 mg by mouth daily.  1  . XTANDI 40 MG capsule TAKE 4 CAPSULES (160 MG TOTAL) BY MOUTH DAILY. 120 capsule 0  . acetaminophen (TYLENOL) 500 MG tablet Take 500 mg by mouth every 4 (four) hours as needed for moderate pain or fever.    [name] albuterol (PROVENTIL HFA;VENTOLIN  HFA) 108 (90 Base) MCG/ACT inhaler Inhale 2 puffs into the lungs every 6 (six) hours as needed for wheezing or shortness of breath. (Patient not taking: Reported on 06/18/2017) 1 Inhaler 2  . cetirizine (ZYRTEC) 10 MG tablet Take 1 tablet (10 mg total) by mouth daily. (Patient not taking: Reported on 06/18/2017) 30 tablet 6  . feeding supplement, GLUCERNA SHAKE, (GLUCERNA SHAKE) LIQD Take 237 mLs by mouth 3 (three) times daily between meals. (Patient not taking: Reported on 06/18/2017) 90 Can 5  . ibuprofen (ADVIL,MOTRIN) 200 MG tablet Take 200 mg by mouth every 4 (four) hours as needed for fever or moderate pain.    . Insulin Syringe-Needle U-100 (B-D INS SYR ULTRAFINE 1CC/31G) 31G X  5/16" 1 ML MISC Use as directed (Patient not taking: Reported on 06/18/2017) 100 each 0  . ondansetron (ZOFRAN) 8 MG tablet One pill every 8 hours as needed for nausea/vomitting. (Patient not taking: Reported on 06/18/2017) 40 tablet 1  . prochlorperazine (COMPAZINE) 10 MG tablet Take 1 tablet (10 mg total) by mouth every 6 (six) hours as needed for nausea or vomiting. (Patient not taking: Reported on 06/18/2017) 40 tablet 1   No current facility-administered medications for this visit.       [name]  PHYSICAL EXAMINATION: ECOG PERFORMANCE STATUS: 2 - Symptomatic, <50% confined to bed  Vitals:   06/18/17 1025  BP: (!) 157/82  Pulse: 93  Resp: 18  Temp: (!) 95.9 F (35.5 C)   Filed Weights   06/18/17 1025  Weight: 128 lb 9.6 oz (58.3 kg)    GENERAL: Well-nourished well-developed; Alert, no distress and comfortable.   Accompanied by daughter.  EYES: no pallor or icterus OROPHARYNX: no thrush or ulceration; good dentition  NECK: supple, no masses felt LYMPH:  no palpable lymphadenopathy in the cervical, axillary or inguinal regions LUNGS: clear to auscultation and  No wheeze or crackles HEART/CVS: regular rate & rhythm and no murmurs; No lower extremity edema ABDOMEN: abdomen soft, non-tender and normal bowel  sounds; Foley catheter in place.  Musculoskeletal:no cyanosis of digits and no clubbing  PSYCH: alert & oriented x 3 with fluent speech NEURO: no focal motor/sensory deficits SKIN:  no rashes or significant lesions  LABORATORY DATA:  I have reviewed the data as listed Lab Results  Component Value Date   WBC 7.0 06/18/2017   HGB 10.4 (L) 06/18/2017   HCT 30.3 (L) 06/18/2017   MCV 86.8 06/18/2017   PLT 295 06/18/2017   Recent Labs    05/07/17 0740  05/17/17 1054 05/18/17 0334 05/23/17 0840 06/18/17 0956  NA 124*   < > 133* 135 134* 132*  K 4.0   < > 4.4 4.1 5.2* 4.9  CL 95*   < > 102 104 102 102  CO2 20*   < > 21* 22 24 21*  GLUCOSE 274*   < > 226* 89 285* 288*  BUN 22*   < > 21* 16 23* 27*  CREATININE 1.00   < > 0.81 0.77 0.84 1.03  CALCIUM 7.6*   < > 7.7* 7.7* 8.4* 8.7*  GFRNONAA >60   < > >60 >60 >60 >60  GFRAA >60   < > >60 >60 >60 >60  PROT 7.9  --  7.6  --   --  7.4  ALBUMIN 3.0*  --  2.8*  --   --  3.4*  AST 30  --  29  --   --  21  ALT 29  --  33  --   --  16*  ALKPHOS 522*  --  859*  --   --  341*  BILITOT 0.5  --  0.4  --   --  0.4   < > = values in this interval not displayed.    RADIOGRAPHIC STUDIES: I have personally reviewed the radiological images as listed and agreed with the findings in the report. Ir Convert Right Nephrostomy To Nephroureteral Cath  Result Date: 05/21/2017 INDICATION: 84 year old with prostate cancer and history of obstructive uropathy. Patient currently has a right nephrostomy tube, left ureteral stent and a Foley catheter. Patient presents for conversion of the nephrostomy tube to an antegrade ureteral stent. EXAM: PLACEMENT OF ANTEGRADE RIGHT URETERAL STENT WITH  EXISTING RIGHT NEPHROSTOMY TUBE ACCESS COMPARISON:  04/13/2017 MEDICATIONS: Ciprofloxacin 400 mg; The antibiotic was administered in an appropriate time frame prior to skin puncture. ANESTHESIA/SEDATION: Fentanyl 25 mcg IV; Versed 1.0 mg IV Moderate Sedation Time:  25 minutes  The patient was continuously monitored during the procedure by the interventional radiology nurse under my direct supervision. CONTRAST:  15 mL Isovue-300-administered into the collecting system(s) FLUOROSCOPY TIME:  Fluoroscopy Time: 4 minutes and 24 seconds, 21 mGy COMPLICATIONS: None immediate. PROCEDURE: Informed written consent was obtained from the patient and daughter after a thorough discussion of the procedural risks, benefits and alternatives. All questions were addressed. Maximal Sterile Barrier Technique was utilized including caps, mask, sterile gowns, sterile gloves, sterile drape, hand hygiene and skin antiseptic. A timeout was performed prior to the initiation of the procedure. Patient was placed prone on the interventional table. The right flank was prepped and draped in sterile fashion. Contrast was injected through the nephrostomy tube. The catheter was cut and removed over a Bentson wire. A 5 French Kumpe catheter was advanced into the distal ureter and contrast was injected. Bentson wire easily advanced into the urinary bladder. Bentson wire was exchanged for a stiff Amplatz wire. The 5 French catheter was removed over the wire and exchanged for a 7 [place] vascular sheath. A Bentson wire was placed through the 7 [place] vascular sheath as a safety wire. An 8 [place], 22 cm ureteral stent was selected. The stent was advanced over the stiff Amplatz wire. There was some resistance advancing the stent into the bladder but the stent was successfully placed in the bladder. The distal aspect of the stent reconstituted in the urinary bladder. The proximal aspect of stent was reconstituted in the renal pelvis. Final nephrostogram was performed through the existing access. The stent was widely patent. The existing nephrostomy tube access and the safety wire were removed with fluoroscopy. Bandage placed at the old nephrostomy tube site. FINDINGS: Distal right ureter is patent. Right ureteral stent is well  positioned within the urinary bladder and renal pelvis. IMPRESSION: Successful placement of an antegrade right ureteral stent through the existing right nephrostomy tube access. Nephrostomy tube access was completely removed at the end of the procedure. Electronically Signed   By: Markus Daft M.D.   On: 05/21/2017 12:18    ASSESSMENT & PLAN:   Prostate cancer [facility]) # Metastatic prostate cancer/ castrate sensitive [bulky retroperitoneal adenopathy; invasion into the bladder; rectum].  PET scan March 2019-pelvic/prostate uptake; diffuse bony uptake; without any bony lesions noted on CT imaging.  # Currently on degarelix and [name] [April 2nd 2019] response noted with improvement of PSA/also clinical improvement.  No major side effects noted.  #Obstructive uropathy-bilateral ureteral stents; right side nephrostomy tube-explanted.  Continues to have Foley catheter-given outflow obstruction; will check with urology regarding timing of imaging/which could help make decisions regarding take the Foley catheter out.  # poorly controlled Blood glucose-patient is on lantus/ novolo; improving- 140-150s; increase lantus to 16 at night ; continue humalog scale.    #Bone mets on PET scan; no obvious CT correlate-hold any further imaging or biopsy at this time- Hold x-geva-  given hypocalcemia- ca- 8.7 today.   # Elevated alkaline phosphatase-bony metastases- improving.  # shot today; follow up in 1 months/labs- shot. Will check with Dr.Brandon re: imaging timing.   All questions were answered. The patient knows to call the clinic with any problems, questions or concerns.   [name], MD 06/18/2017 1:21 PM

## 2017-06-18 NOTE — Progress Notes (Signed)
Here for follow up- overall per daughter is doing "good " indwelling catheter changed Fri  06/15/17

## 2017-06-20 ENCOUNTER — Ambulatory Visit: Payer: Self-pay | Admitting: Internal Medicine

## 2017-06-20 ENCOUNTER — Other Ambulatory Visit: Payer: Self-pay

## 2017-06-20 ENCOUNTER — Ambulatory Visit: Payer: Self-pay

## 2017-06-22 ENCOUNTER — Telehealth: Payer: Self-pay | Admitting: Urology

## 2017-06-22 DIAGNOSIS — E1165 Type 2 diabetes mellitus with hyperglycemia: Secondary | ICD-10-CM | POA: Diagnosis not present

## 2017-06-22 DIAGNOSIS — C61 Malignant neoplasm of prostate: Secondary | ICD-10-CM | POA: Diagnosis not present

## 2017-06-22 NOTE — Telephone Encounter (Signed)
Pt's daughter (I think) called with some questions about pt.  He had a positive urine culture.  Please give them a call.

## 2017-06-22 NOTE — Telephone Encounter (Signed)
Pt daughter called and said that pt oncology doctor was supposed to reach out and speak with you about pt catheter. She is wondering when the pt is going to be able to have his catheter removed.

## 2017-06-24 NOTE — Telephone Encounter (Signed)
Thanks, for keeping me in loop. I agree with you JO:INOMVEH the foley catheter in for now. I think 3 Months from starting of his medication [[name]] would be reasonable; ie late June/beginnig of July to assess for response.

## 2017-06-24 NOTE — Telephone Encounter (Signed)
Lengthy discussion with the daughter at his last visit at the time of catheter exchange.  We discussed that I would like to hold off on removing his catheter until we have follow-up imaging per Dr. Rogue Bussing.  He was offered and alternatives at that time including self cath daily to keep his bladder neck patent but declined.  [name], MD

## 2017-06-27 ENCOUNTER — Other Ambulatory Visit: Payer: Self-pay

## 2017-06-28 ENCOUNTER — Other Ambulatory Visit: Payer: Self-pay

## 2017-06-28 MED ORDER — GLIMEPIRIDE 4 MG PO TABS: 4.0000 mg | ORAL_TABLET | Freq: Two times a day (BID) | ORAL | 3 refills | Status: DC

## 2017-06-30 ENCOUNTER — Other Ambulatory Visit: Payer: Self-pay | Admitting: Internal Medicine

## 2017-07-04 ENCOUNTER — Ambulatory Visit: Payer: Self-pay | Admitting: Internal Medicine

## 2017-07-09 ENCOUNTER — Other Ambulatory Visit: Payer: Self-pay | Admitting: Internal Medicine

## 2017-07-09 DIAGNOSIS — C61 Malignant neoplasm of prostate: Secondary | ICD-10-CM

## 2017-07-11 ENCOUNTER — Other Ambulatory Visit: Payer: Self-pay | Admitting: Internal Medicine

## 2017-07-11 DIAGNOSIS — C61 Malignant neoplasm of prostate: Secondary | ICD-10-CM

## 2017-07-11 MED FILL — XTANDI 40 MG CAPSULE: 40 | 30 days supply | Qty: 120 | Fill #0

## 2017-07-13 ENCOUNTER — Inpatient Hospital Stay: Payer: Medicare Other | Attending: Internal Medicine

## 2017-07-13 ENCOUNTER — Ambulatory Visit (INDEPENDENT_AMBULATORY_CARE_PROVIDER_SITE_OTHER): Payer: Medicare Other | Admitting: Family Medicine

## 2017-07-13 ENCOUNTER — Encounter: Payer: Self-pay | Admitting: Family Medicine

## 2017-07-13 ENCOUNTER — Inpatient Hospital Stay (HOSPITAL_BASED_OUTPATIENT_CLINIC_OR_DEPARTMENT_OTHER): Payer: Medicare Other | Admitting: Internal Medicine

## 2017-07-13 ENCOUNTER — Inpatient Hospital Stay: Payer: Medicare Other

## 2017-07-13 ENCOUNTER — Other Ambulatory Visit: Payer: Self-pay

## 2017-07-13 VITALS — BP 123/73 | HR 76 | Temp 97.8°F | Resp 16 | Wt 125.6 lb

## 2017-07-13 VITALS — BP 127/72 | HR 109 | Ht 66.0 in | Wt 126.2 lb

## 2017-07-13 DIAGNOSIS — R748 Abnormal levels of other serum enzymes: Secondary | ICD-10-CM

## 2017-07-13 DIAGNOSIS — N400 Enlarged prostate without lower urinary tract symptoms: Secondary | ICD-10-CM | POA: Insufficient documentation

## 2017-07-13 DIAGNOSIS — Z79899 Other long term (current) drug therapy: Secondary | ICD-10-CM | POA: Diagnosis not present

## 2017-07-13 DIAGNOSIS — C7951 Secondary malignant neoplasm of bone: Secondary | ICD-10-CM

## 2017-07-13 DIAGNOSIS — N320 Bladder-neck obstruction: Secondary | ICD-10-CM

## 2017-07-13 DIAGNOSIS — K219 Gastro-esophageal reflux disease without esophagitis: Secondary | ICD-10-CM | POA: Insufficient documentation

## 2017-07-13 DIAGNOSIS — E1165 Type 2 diabetes mellitus with hyperglycemia: Secondary | ICD-10-CM

## 2017-07-13 DIAGNOSIS — I10 Essential (primary) hypertension: Secondary | ICD-10-CM | POA: Diagnosis not present

## 2017-07-13 DIAGNOSIS — E785 Hyperlipidemia, unspecified: Secondary | ICD-10-CM | POA: Insufficient documentation

## 2017-07-13 DIAGNOSIS — C61 Malignant neoplasm of prostate: Secondary | ICD-10-CM

## 2017-07-13 DIAGNOSIS — I2510 Atherosclerotic heart disease of native coronary artery without angina pectoris: Secondary | ICD-10-CM | POA: Insufficient documentation

## 2017-07-13 DIAGNOSIS — Z794 Long term (current) use of insulin: Secondary | ICD-10-CM | POA: Diagnosis not present

## 2017-07-13 DIAGNOSIS — Z5111 Encounter for antineoplastic chemotherapy: Secondary | ICD-10-CM | POA: Insufficient documentation

## 2017-07-13 LAB — COMPREHENSIVE METABOLIC PANEL
ALT: 22 U/L (ref 17–63)
AST: 26 U/L (ref 15–41)
Albumin: 3.7 g/dL (ref 3.5–5.0)
Alkaline Phosphatase: 157 U/L — ABNORMAL HIGH (ref 38–126)
Anion gap: 6 (ref 5–15)
BUN: 26 mg/dL — ABNORMAL HIGH (ref 6–20)
CO2: 23 mmol/L (ref 22–32)
Calcium: 9.1 mg/dL (ref 8.9–10.3)
Chloride: 104 mmol/L (ref 101–111)
Creatinine, Ser: 0.95 mg/dL (ref 0.61–1.24)
GFR calc Af Amer: >60 mL/min (ref >60)
GFR calc non Af Amer: >60 mL/min (ref >60)
Glucose, Bld: 219 mg/dL — ABNORMAL HIGH (ref 65–99)
Potassium: 4.9 mmol/L (ref 3.5–5.1)
Sodium: 133 mmol/L — ABNORMAL LOW (ref 135–145)
Total Bilirubin: 0.5 mg/dL (ref 0.3–1.2)
Total Protein: 8 g/dL (ref 6.5–8.1)

## 2017-07-13 LAB — CBC WITH DIFFERENTIAL/PLATELET
Basophils Absolute: 0 10*3/uL (ref 0–0.1)
Basophils Relative: 1 %
Eosinophils Absolute: 0.1 10*3/uL (ref 0–0.7)
Eosinophils Relative: 1 %
HCT: 32.7 % — ABNORMAL LOW (ref 40.0–52.0)
Hemoglobin: 11.2 g/dL — ABNORMAL LOW (ref 13.0–18.0)
Lymphocytes Relative: 20 %
Lymphs Abs: 1.3 10*3/uL (ref 1.0–3.6)
MCH: 30 pg (ref 26.0–34.0)
MCHC: 34.2 g/dL (ref 32.0–36.0)
MCV: 87.5 fL (ref 80.0–100.0)
Monocytes Absolute: 0.7 10*3/uL (ref 0.2–1.0)
Monocytes Relative: 11 %
Neutro Abs: 4.5 10*3/uL (ref 1.4–6.5)
Neutrophils Relative %: 67 %
Platelets: 352 10*3/uL (ref 150–440)
RBC: 3.73 MIL/uL — ABNORMAL LOW (ref 4.40–5.90)
RDW: 16.3 % — ABNORMAL HIGH (ref 11.5–14.5)
WBC: 6.7 10*3/uL (ref 3.8–10.6)

## 2017-07-13 MED ORDER — DEGARELIX ACETATE 80 MG ~~LOC~~ SOLR
80.0000 mg | Freq: Once | SUBCUTANEOUS | Status: AC
  Administered 2017-07-13: 80 mg via SUBCUTANEOUS
  Filled 2017-07-13: qty 4

## 2017-07-13 NOTE — Progress Notes (Signed)
[place] OFFICE PROGRESS NOTE  Patient Care Team: [name], MD as PCP - General (Internal Medicine)  Cancer Staging No matching staging information was found for the patient.   Oncology History   # FEB 2019-Metastatic prostate cancer/ castrate sensitive [bulky retroperitoneal adenopathy; invasion into the bladder; rectum] Bladder Bx- prostate. On Degarelix  # April 3rd 2019- [name] [111md]  # Poorly controlled DM-on insulin  # s/p right PCN [explanted]/ Foley cath [Dr.Brandon]; multiple UTIs.  --------------------------------------------------------------------------   DIAGNOSIS: [ FEB 2019] MET. PROSTATE CA  STAGE:   IV ;GOALS: PALLIATIVE  CURRENT/MOST RECENT THERAPY [ Feb-April2019] Lupron+ [name]      Prostate cancer ([facility]      INTERVAL HISTORY:  Michael HIGHAM85y.o.  male pleasant patient above history of metastatic prostate cancer is here for follow-up.  Patient complains of mild fatigue.  Otherwise appetite is improving.  No nausea no vomiting.  Continues to carry a Foley catheter.  No nausea no vomiting.  Denies any seizures.  States her blood sugars are improving.  Review of Systems  Constitutional: Positive for malaise/fatigue. Negative for chills, diaphoresis, fever and weight loss.  HENT: Negative for nosebleeds and sore throat.   Eyes: Negative for double vision.  Respiratory: Negative for cough, hemoptysis, sputum production, shortness of breath and wheezing.   Cardiovascular: Negative for chest pain, palpitations, orthopnea and leg swelling.  Gastrointestinal: Negative for abdominal pain, blood in stool, constipation, diarrhea, heartburn, melena, nausea and vomiting.  Genitourinary: Negative for dysuria, frequency and urgency.  Musculoskeletal: Negative for back pain and joint pain.  Skin: Negative.  Negative for itching and rash.  Neurological: Negative for dizziness, tingling, focal weakness, weakness and headaches.   Endo/Heme/Allergies: Does not bruise/bleed easily.  Psychiatric/Behavioral: Negative for depression. The patient is not nervous/anxious and does not have insomnia.       PAST MEDICAL HISTORY :  Past Medical History:  Diagnosis Date  . Benign prostatic hypertrophy   . Bilateral carotid artery disease (HCC)    Mild plaque formation without obstructive disease noted on carotid Doppler.  . Coronary artery disease    [facility] The patient was told about 2 blockages which did not require revascularization.  . Diabetes mellitus without complication ([place]   . Essential hypertension   . GERD (gastroesophageal reflux disease)   . Hyperlipidemia   . Hypertension   . Left carotid artery stenosis     PAST SURGICAL HISTORY :   Past Surgical History:  Procedure Laterality Date  . CARDIAC CATHETERIZATION  2010  . CARDIAC CATHETERIZATION N/A 02/03/2015   Procedure: Left Heart Cath and Coronary Angiography;  Surgeon: MWellington [name] MD;  [facility];  Service: Cardiovascular;  Laterality: N/A;  . CATARACT EXTRACTION    . CYSTOSCOPY W/ RETROGRADES Bilateral 04/11/2017   Procedure: CYSTOSCOPY WITH RETROGRADE PYELOGRAM;  Surgeon: BHollice Espy MD;  Location: [facility] ORS;  Service: Urology;  Laterality: Bilateral;  . CYSTOSCOPY WITH STENT PLACEMENT Left 04/11/2017   Procedure: CYSTOSCOPY WITH STENT PLACEMENT and fulgeration;  Surgeon: BHollice Espy MD;  Location: [facility] ORS;  Service: Urology;  Laterality: Left;  . CYSTOSCOPY WITH URETHRAL DILATATION Bilateral 04/11/2017   Procedure: CYSTOSCOPY WITH URETHRAL DILATATION;  Surgeon: BHollice Espy MD;  Location: [facility] ORS;  Service: Urology;  Laterality: Bilateral;  . IR CONVERT RIGHT NEPHROSTOMY TO NEPHROURETERAL CATH  05/21/2017  . IR NEPHROSTOMY PLACEMENT RIGHT  04/13/2017  . PROSTATECTOMY      FAMILY  HISTORY :   Family History  Problem Relation Age of Onset  . Hypertension Father      SOCIAL HISTORY:   Social History   Tobacco Use  . Smoking status: Never Smoker  . Smokeless tobacco: Never Used  Substance Use Topics  . Alcohol use: No  . Drug use: No    ALLERGIES:  has No Known Allergies.  MEDICATIONS:  Current Outpatient Medications  Medication Sig Dispense Refill  . acetaminophen (TYLENOL) 500 MG tablet Take 500 mg by mouth every 4 (four) hours as needed for moderate pain or fever.    [name] atorvastatin (LIPITOR) 10 MG tablet Take 10 mg by mouth daily.    . Calcium Carb-Cholecalciferol (CALCIUM 600+D3) 600-800 MG-UNIT TABS Take 1 tablet by mouth daily.    . feeding supplement, GLUCERNA SHAKE, (GLUCERNA SHAKE) LIQD Take 237 mLs by mouth 3 (three) times daily between meals. 90 Can 5  . glucose blood (ACCU-CHEK GUIDE) test strip Use asdirected three times a day diag E11.65 100 each 3  . ibuprofen (ADVIL,MOTRIN) 200 MG tablet Take 200 mg by mouth every 4 (four) hours as needed for fever or moderate pain.    [name] insulin glargine (LANTUS) 100 UNIT/ML injection Inject 14-16 Units into the skin at bedtime. Based on sugar levels in am.    . JARDIANCE 25 MG TABS tablet Take 25 mg by mouth daily.  3  . lisinopril (PRINIVIL,ZESTRIL) 2.5 MG tablet Take 2.5 mg by mouth daily.  4  . metoprolol succinate (TOPROL-XL) 25 MG 24 hr tablet Take 25 mg by mouth daily.   6  . Multiple Vitamins-Minerals (MULTIVITAMIN WITH MINERALS) tablet Take 1 tablet by mouth daily.     [name] omeprazole (PRILOSEC) 20 MG capsule TAKE ONE CAPSULE BY MOUTH EVERY DAY 90 capsule 2  . XTANDI 40 MG capsule TAKE 4 CAPSULES (160 MG TOTAL) BY MOUTH DAILY. 120 capsule 3  . albuterol (PROVENTIL HFA;VENTOLIN HFA) 108 (90 Base) MCG/ACT inhaler Inhale 2 puffs into the lungs every 6 (six) hours as needed for wheezing or shortness of breath. (Patient not taking: Reported on 06/18/2017) 1 Inhaler 2  . cetirizine (ZYRTEC) 10 MG tablet Take 1 tablet (10 mg total) by mouth daily. (Patient not taking: Reported on 06/18/2017) 30  tablet 6  . Insulin Syringe-Needle U-100 (B-D INS SYR ULTRAFINE 1CC/31G) 31G X 5/16" 1 ML MISC Use as directed (Patient not taking: Reported on 06/18/2017) 100 each 0  . ondansetron (ZOFRAN) 8 MG tablet One pill every 8 hours as needed for nausea/vomitting. (Patient not taking: Reported on 06/18/2017) 40 tablet 1  . prochlorperazine (COMPAZINE) 10 MG tablet Take 1 tablet (10 mg total) by mouth every 6 (six) hours as needed for nausea or vomiting. (Patient not taking: Reported on 06/18/2017) 40 tablet 1   No current facility-administered medications for this visit.     PHYSICAL EXAMINATION: ECOG PERFORMANCE STATUS: 1 - Symptomatic but completely ambulatory  BP 123/73 (BP Location: Left Arm, Patient Position: Sitting)   Pulse 76   Temp 97.8 F (36.6 C) (Tympanic)   Resp 16   Wt 125 lb 9.6 oz (57 kg)   BMI 20.27 kg/m   Filed Weights   07/13/17 1048  Weight: 125 lb 9.6 oz (57 kg)    GENERAL: Well-nourished well-developed; Alert, no distress and comfortable.  Accompanied by his daughter.  EYES: no pallor or icterus OROPHARYNX: no thrush or ulceration; NECK: supple; no lymph nodes felt. LYMPH:  no palpable lymphadenopathy in the axillary or inguinal  regions LUNGS: Decreased breath sounds auscultation bilaterally. No wheeze or crackles HEART/CVS: regular rate & rhythm and no murmurs; No lower extremity edema ABDOMEN:abdomen soft, non-tender and normal bowel sounds. No hepatomegaly or splenomegaly.  Positive for Foley catheter. Musculoskeletal:no cyanosis of digits and no clubbing  PSYCH: alert & oriented x 3 with fluent speech NEURO: no focal motor/sensory deficits SKIN:  no rashes or significant lesions    LABORATORY DATA:  I have reviewed the data as listed    Component Value Date/Time   NA 133 (L) 07/13/2017 0934   NA 136 05/07/2012 1328   K 4.9 07/13/2017 0934   K 4.5 05/07/2012 1328   CL 104 07/13/2017 0934   CL 104 05/07/2012 1328   CO2 23 07/13/2017 0934   CO2 28  05/07/2012 1328   GLUCOSE 219 (H) 07/13/2017 0934   GLUCOSE 73 05/07/2012 1328   BUN 26 (H) 07/13/2017 0934   BUN 16 05/07/2012 1328   CREATININE 0.95 07/13/2017 0934   CREATININE 0.84 05/07/2012 1328   CALCIUM 9.1 07/13/2017 0934   CALCIUM 8.8 05/07/2012 1328   PROT 8.0 07/13/2017 0934   ALBUMIN 3.7 07/13/2017 0934   AST 26 07/13/2017 0934   ALT 22 07/13/2017 0934   ALKPHOS 157 (H) 07/13/2017 0934   BILITOT 0.5 07/13/2017 0934   GFRNONAA >60 07/13/2017 0934   GFRNONAA >60 05/07/2012 1328   GFRAA >60 07/13/2017 0934   GFRAA >60 05/07/2012 1328    No results found for: SPEP, UPEP  Lab Results  Component Value Date   WBC 6.7 07/13/2017   NEUTROABS 4.5 07/13/2017   HGB 11.2 (L) 07/13/2017   HCT 32.7 (L) 07/13/2017   MCV 87.5 07/13/2017   PLT 352 07/13/2017      Chemistry      Component Value Date/Time   NA 133 (L) 07/13/2017 0934   NA 136 05/07/2012 1328   K 4.9 07/13/2017 0934   K 4.5 05/07/2012 1328   CL 104 07/13/2017 0934   CL 104 05/07/2012 1328   CO2 23 07/13/2017 0934   CO2 28 05/07/2012 1328   BUN 26 (H) 07/13/2017 0934   BUN 16 05/07/2012 1328   CREATININE 0.95 07/13/2017 0934   CREATININE 0.84 05/07/2012 1328      Component Value Date/Time   CALCIUM 9.1 07/13/2017 0934   CALCIUM 8.8 05/07/2012 1328   ALKPHOS 157 (H) 07/13/2017 0934   AST 26 07/13/2017 0934   ALT 22 07/13/2017 0934   BILITOT 0.5 07/13/2017 0934     Results for RODGER, GIANGREGORIO (MRN [id number]) as of 07/13/2017 11:02  Ref. Range 04/11/2017 12:10 05/04/2017 09:28 06/18/2017 09:56  Prostatic Specific Antigen Latest Ref Range: 0.00 - 4.00 ng/mL 9.02 (H) 1.80 0.23    RADIOGRAPHIC STUDIES: I have personally reviewed the radiological images as listed and agreed with the findings in the report. No results found.   ASSESSMENT & PLAN:  Prostate cancer [facility]) # Metastatic prostate cancer/ castrate sensitive [bulky retroperitoneal adenopathy; invasion into the bladder; rectum].  PET scan  March 2019-pelvic/prostate uptake; diffuse bony uptake; without any bony lesions noted on CT imaging.  #Continue degarelix; Xtandi[April 2019]-clinical improvement noted PSA also improved.  #Obstructive uropathy-status post bilateral stenting; right nephrostomy expanded.  Continue Foley catheter given bladder neck obstruction; patient declines self-catheterization.  Will get a CT scan to reevaluate prior to next visit.  Also discussed with Dr. Erlene Quan.  #Diabetes-currently blood sugars improving.   #Question bone mets on PET scan; hold Xgeva.  Stable  #  Elevated alkaline phosphatase-bony metastases-improving  # CT scan a/p; labs- psa; follow up with me on jun e 28th; firmagin shot.    Orders Placed This Encounter  Procedures  . CT Abdomen Pelvis W Contrast    Standing Status:   Future    Standing Expiration Date:   07/13/2018    Order Specific Question:   If indicated for the ordered procedure, I authorize the administration of contrast media per Radiology protocol    Answer:   Yes    Order Specific Question:   Preferred imaging location?    Answer:   [facility]    Order Specific Question:   Is Oral Contrast requested for this exam?    Answer:   Yes, Per Radiology protocol    Order Specific Question:   Radiology Contrast Protocol - do NOT remove file path    Answer:   _[email or web address]  . CBC with Differential    Standing Status:   Future    Standing Expiration Date:   07/14/2018  . Comprehensive metabolic panel    Standing Status:   Future    Standing Expiration Date:   07/14/2018  . PSA    Standing Status:   Future    Standing Expiration Date:   07/14/2018   All questions were answered. The patient knows to call the clinic with any problems, questions or concerns.      [name], MD 07/16/2017 7:35 AM

## 2017-07-13 NOTE — Progress Notes (Signed)
Cath Change/ Replacement  Patient is present today for a catheter change due to urinary retention.  23ml of water was removed from the balloon, a 16 coude foley cath was removed with out difficulty.  Patient was cleaned and prepped in a sterile fashion with betadine and 2% lidocaine jelly was instilled into the urethra. A 16 coude foley cath was replaced into the bladder no complications were noted Urine return was noted 46ml and urine was yellow/cloudy in color. The balloon was filled with 42ml of sterile water. A night bag was attached for drainage. Patient was given proper instruction on catheter care.    Preformed by: Elberta Leatherwood, CMA  Follow up: 1 month

## 2017-07-13 NOTE — Assessment & Plan Note (Addendum)
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  PET scan March 2019-pelvic/prostate uptake; diffuse bony uptake; without any bony lesions noted on CT imaging.  #Continue degarelix; Xtandi[April 2019]-clinical improvement noted PSA also improved.  #Obstructive uropathy-status post bilateral stenting; right nephrostomy expanded.  Continue Foley catheter given bladder neck obstruction; patient declines self-catheterization.  Will get a CT scan to reevaluate prior to next visit.  Also discussed with Dr. Erlene Quan.  #Diabetes-currently blood sugars improving.   #Question bone mets on PET scan; hold Xgeva.  Stable  # Elevated alkaline phosphatase-bony metastases-improving  # CT scan a/p; labs- psa; follow up with me on jun e 28th; firmagin shot.

## 2017-07-18 ENCOUNTER — Ambulatory Visit: Payer: Self-pay | Admitting: Internal Medicine

## 2017-07-18 ENCOUNTER — Ambulatory Visit: Payer: Self-pay

## 2017-07-18 ENCOUNTER — Other Ambulatory Visit: Payer: Self-pay

## 2017-07-24 DIAGNOSIS — E119 Type 2 diabetes mellitus without complications: Secondary | ICD-10-CM | POA: Diagnosis not present

## 2017-07-24 DIAGNOSIS — Z794 Long term (current) use of insulin: Secondary | ICD-10-CM | POA: Diagnosis not present

## 2017-07-24 DIAGNOSIS — C61 Malignant neoplasm of prostate: Secondary | ICD-10-CM | POA: Diagnosis not present

## 2017-07-27 ENCOUNTER — Telehealth: Payer: Self-pay

## 2017-07-27 ENCOUNTER — Telehealth: Payer: Self-pay | Admitting: Urology

## 2017-07-27 MED ORDER — CIPROFLOXACIN HCL 500 MG PO TABS: 500.0000 mg | ORAL_TABLET | Freq: Two times a day (BID) | ORAL | 0 refills | Status: DC

## 2017-07-27 NOTE — Telephone Encounter (Signed)
Received [facility] fax from [facility] and it advised that patient currently doesn't want any assistance with community resources.  dbs

## 2017-07-27 NOTE — Telephone Encounter (Signed)
Patient and his daughter walked into the office todaay with low-grade temp 100.2.  In the setting of chronic stents, indwelling Foley he high-risk for infection, we will go ahead and treat with Cipro 500 mg twice daily as a precaution.  Called in to pharmacy.  They will go to the emergency room if symptoms worsen.  [name], MD

## 2017-07-31 ENCOUNTER — Telehealth: Payer: Self-pay | Admitting: Urology

## 2017-07-31 NOTE — Telephone Encounter (Signed)
Pt daughter Hina, called stating she ran an UA on her dad's the pt's urine and saw a lot of yeast in his urine and would like to have something called in for this @ [facility]. Please call daughter at work or on her cell. Please advise. Thanks.

## 2017-08-02 ENCOUNTER — Ambulatory Visit: Payer: Medicare Other

## 2017-08-02 NOTE — Telephone Encounter (Signed)
Pt daughter informed he needs to come in for nurse visit, scheduled for 3pm.

## 2017-08-03 ENCOUNTER — Ambulatory Visit (INDEPENDENT_AMBULATORY_CARE_PROVIDER_SITE_OTHER): Payer: Medicare Other | Admitting: Family Medicine

## 2017-08-03 DIAGNOSIS — R8290 Unspecified abnormal findings in urine: Secondary | ICD-10-CM | POA: Diagnosis not present

## 2017-08-03 DIAGNOSIS — N390 Urinary tract infection, site not specified: Secondary | ICD-10-CM | POA: Diagnosis not present

## 2017-08-03 DIAGNOSIS — N320 Bladder-neck obstruction: Secondary | ICD-10-CM

## 2017-08-03 NOTE — Progress Notes (Signed)
A urine sample was not collected today because patient is still currently on ABx for infection. He has been rescheduled to come in next week to learn CIC.

## 2017-08-06 ENCOUNTER — Ambulatory Visit (INDEPENDENT_AMBULATORY_CARE_PROVIDER_SITE_OTHER): Payer: Medicare Other

## 2017-08-06 DIAGNOSIS — R339 Retention of urine, unspecified: Secondary | ICD-10-CM | POA: Diagnosis not present

## 2017-08-06 LAB — URINALYSIS, COMPLETE
Bilirubin, UA: NEGATIVE
Ketones, UA: NEGATIVE
Nitrite, UA: POSITIVE — AB
Specific Gravity, UA: 1.01 (ref 1.005–1.030)
Urobilinogen, Ur: 0.2 mg/dL (ref 0.2–1.0)
pH, UA: 5.5 (ref 5.0–7.5)

## 2017-08-06 LAB — MICROSCOPIC EXAMINATION: Epithelial Cells (non renal): NONE SEEN /hpf (ref 0–10)

## 2017-08-06 NOTE — Progress Notes (Signed)
Catheter Removal  Patient is present today for a catheter removal.  35ml of water was drained from the balloon. A 16FR foley cath was removed from the bladder no complications were noted . Patient tolerated well.  Preformed by: Fonnie Jarvis, CMA  Follow up/ Additional notes: prior to removal cath was plugged to get urine to send for culture , UA done at PCP on Friday was not sent, patient is no longer on abx  Continuous Intermittent Catheterization  Due to urinary retention patient is present today for a teaching of self I & O Catheterization. Patient was given detailed verbal and printed instructions of self catheterization. Patient was cleaned and prepped in a sterile fashion.  With instruction and assistance patient inserted a 14FR coude (due to BPH/obsrtruction and prostate cancer a coude was used) and urine return was noted 20 ml, urine was milky in color. Patient tolerated well, no complications were noted Patient was given a sample bag with supplies to take home.  Instructions were given per Dr. Erlene Quan for patient to cath 2 times daily morning and night if able to urinate and record PVR, if patient not able to urinate he is to cath 4 times daily and record volumes. Call in one week with volumes for further instruction on how many times to CIC and which cath is perfered. Patient given samples of spedicath and flex pro to try.  An order will placed with [facility] when they call back in a week with this information.   Preformed by: Fonnie Jarvis, CMA  Additional Notes: Patient's daughter and son-in-law were present at today's visit and were given detailed instruction as well, will call with culture results. Will wait to treat, they will call if patient gets fever of 101 or higher

## 2017-08-08 ENCOUNTER — Telehealth: Payer: Self-pay | Admitting: *Deleted

## 2017-08-08 ENCOUNTER — Ambulatory Visit (INDEPENDENT_AMBULATORY_CARE_PROVIDER_SITE_OTHER): Payer: Medicare Other | Admitting: Urology

## 2017-08-08 ENCOUNTER — Encounter: Payer: Self-pay | Admitting: Urology

## 2017-08-08 ENCOUNTER — Telehealth: Payer: Self-pay | Admitting: Radiology

## 2017-08-08 VITALS — BP 130/76 | HR 97 | Ht 66.0 in | Wt 125.4 lb

## 2017-08-08 DIAGNOSIS — B3749 Other urogenital candidiasis: Secondary | ICD-10-CM

## 2017-08-08 DIAGNOSIS — I2510 Atherosclerotic heart disease of native coronary artery without angina pectoris: Secondary | ICD-10-CM | POA: Diagnosis not present

## 2017-08-08 DIAGNOSIS — N481 Balanitis: Secondary | ICD-10-CM | POA: Diagnosis not present

## 2017-08-08 DIAGNOSIS — C61 Malignant neoplasm of prostate: Secondary | ICD-10-CM

## 2017-08-08 LAB — CULTURE, URINE COMPREHENSIVE

## 2017-08-08 MED ORDER — NYSTATIN-TRIAMCINOLONE 100000-0.1 UNIT/GM-% EX OINT: 1.0000 "application " | TOPICAL_OINTMENT | Freq: Two times a day (BID) | CUTANEOUS | 0 refills | Status: DC

## 2017-08-08 NOTE — Telephone Encounter (Signed)
Daughter states patient's penis is red & very sore. States patient says it is painful to self cath. She asks for medication be called to [facility] on [address]. since the urinalysis shows yeast is present. Please advise.

## 2017-08-08 NOTE — Telephone Encounter (Signed)
Patient urine is very cloudy and she brought a sample to look at under microscope and has found yeast in his urine. Also of not is that his penis is red and she told the nurse when she caome to remove his foley and was told id id red because he is on cipro. Asking if we can order something for his yeast, do you want to order a ua with microscopic? She still has the sample if so. Please advise

## 2017-08-08 NOTE — Progress Notes (Signed)
08/08/2017 3:27 PM   Michael [name] November 01, 1933 [id number]  Referring provider: Tracie [name], [facility] [place], [place] [address]  Chief Complaint  Patient presents with  . Penis Pain    HPI: Patient is an 84 year old male who presents today with his daughter, Michael [name], with the complaint of a red and swollen penis.   Patient's daughter states that he is been having pain and swelling in his penis before the Foley catheter was removed.  She is a Best boy has been dipping his urine and looking at his urine underneath the microscope.  She states that she sees yeast in his urine specimen.   Patient denies any gross hematuria, dysuria or suprapubic/flank pain.  Patient denies any fevers, chills, nausea or vomiting.   He states he has been voiding every 2 hours since the catheter was removed.  He is self cathing volumes of 75 cc.  He is about twice daily.    He has a history of metastatic prostate cancer with bulky retroperitoneal adenopathy, visceral, and bone disease who is under the care of Dr. Rogue [name] and on ADT/Xtandi.     PMH: Past Medical History:  Diagnosis Date  . Benign prostatic hypertrophy   . Bilateral carotid artery disease (HCC)    Mild plaque formation without obstructive disease noted on carotid Doppler.  . Coronary artery disease    Previous cardiac catheterization at [facility] in 2010. The patient was told about 2 blockages which did not require revascularization.  . Diabetes mellitus without complication (Michael [name])   . Essential hypertension   . GERD (gastroesophageal reflux disease)   . Hyperlipidemia   . Hypertension   . Left carotid artery stenosis     Surgical History: Past Surgical History:  Procedure Laterality Date  . CARDIAC CATHETERIZATION  2010  . CARDIAC CATHETERIZATION N/A 02/03/2015   Procedure: Left Heart Cath and Coronary Angiography;  Surgeon: Wellington [name], MD;  Location: [facility];  Service: Cardiovascular;  Laterality: N/A;  . CATARACT EXTRACTION    . CYSTOSCOPY W/ RETROGRADES Bilateral 04/11/2017   Procedure: CYSTOSCOPY WITH RETROGRADE PYELOGRAM;  Surgeon: [name], MD;  Location: [facility];  Service: Urology;  Laterality: Bilateral;  . CYSTOSCOPY WITH STENT PLACEMENT Left 04/11/2017   Procedure: CYSTOSCOPY WITH STENT PLACEMENT and fulgeration;  Surgeon: [name], MD;  Location: [facility];  Service: Urology;  Laterality: Left;  . CYSTOSCOPY WITH URETHRAL DILATATION Bilateral 04/11/2017   Procedure: CYSTOSCOPY WITH URETHRAL DILATATION;  Surgeon: [name], MD;  Location: [facility];  Service: Urology;  Laterality: Bilateral;  . IR CONVERT RIGHT NEPHROSTOMY TO NEPHROURETERAL CATH  05/21/2017  . IR NEPHROSTOMY PLACEMENT RIGHT  04/13/2017  . PROSTATECTOMY      Home Medications:  Allergies as of 08/08/2017   No Known Allergies     Medication List        Accurate as of 08/08/17  3:27 PM. Always use your most recent med list.          acetaminophen 500 MG tablet Commonly known as:  TYLENOL Take 500 mg by mouth every 4 (four) hours as needed for moderate pain or fever.   albuterol 108 (90 Base) MCG/ACT inhaler Commonly known as:  PROVENTIL HFA;VENTOLIN HFA Inhale 2 puffs into the lungs every 6 (six) hours as needed for wheezing or shortness of breath.   atorvastatin 10 MG tablet Commonly known as:  LIPITOR Take 10 mg by mouth daily.   CALCIUM  600+D3 600-800 MG-UNIT Tabs Generic drug:  Calcium Carb-Cholecalciferol Take 1 tablet by mouth daily.   cetirizine 10 MG tablet Commonly known as:  ZYRTEC Take 1 tablet (10 mg total) by mouth daily.   ciprofloxacin 500 MG tablet Commonly known as:  CIPRO Take 1 tablet (500 mg total) by mouth 2 (two) times daily.   feeding supplement (GLUCERNA SHAKE) Liqd Take 237 mLs by mouth 3 (three) times daily between meals.   glucose blood test strip Commonly known as:  ACCU-CHEK GUIDE Use asdirected three  times a day diag E11.65   ibuprofen 200 MG tablet Commonly known as:  ADVIL,MOTRIN Take 200 mg by mouth every 4 (four) hours as needed for fever or moderate pain.   insulin glargine 100 UNIT/ML injection Commonly known as:  LANTUS Inject 14-16 Units into the skin at bedtime. Based on sugar levels in am.   Insulin Syringe-Needle U-100 31G X 5/16" 1 ML Misc Commonly known as:  B-D INS SYR ULTRAFINE 1CC/31G Use as directed   JARDIANCE 25 MG Tabs tablet Generic drug:  empagliflozin Take 25 mg by mouth daily.   lisinopril 2.5 MG tablet Commonly known as:  PRINIVIL,ZESTRIL Take 2.5 mg by mouth daily.   metoprolol succinate 25 MG 24 hr tablet Commonly known as:  TOPROL-XL Take 25 mg by mouth daily.   multivitamin with minerals tablet Take 1 tablet by mouth daily.   nystatin-triamcinolone ointment Commonly known as:  MYCOLOG Apply 1 application topically 2 (two) times daily.   omeprazole 20 MG capsule Commonly known as:  PRILOSEC TAKE ONE CAPSULE BY MOUTH EVERY DAY   ondansetron 8 MG tablet Commonly known as:  ZOFRAN One pill every 8 hours as needed for nausea/vomitting.   prochlorperazine 10 MG tablet Commonly known as:  COMPAZINE Take 1 tablet (10 mg total) by mouth every 6 (six) hours as needed for nausea or vomiting.   XTANDI 40 MG capsule Generic drug:  enzalutamide TAKE 4 CAPSULES (160 MG TOTAL) BY MOUTH DAILY.       Allergies: No Known Allergies  Family History: Family History  Problem Relation Age of Onset  . Hypertension Father     Social History:  reports that he has never smoked. He has never used smokeless tobacco. He reports that he does not drink alcohol or use drugs.  ROS: UROLOGY Frequent Urination?: Yes Hard to postpone urination?: No Burning/pain with urination?: No Get up at night to urinate?: No Leakage of urine?: No Urine stream starts and stops?: No Trouble starting stream?: No Do you have to strain to urinate?: No Blood in urine?:  Yes Urinary tract infection?: Yes Sexually transmitted disease?: No Injury to kidneys or bladder?: No Painful intercourse?: No Weak stream?: No Erection problems?: No Penile pain?: No  Gastrointestinal Nausea?: No Vomiting?: No Indigestion/heartburn?: No Diarrhea?: No Constipation?: No  Constitutional Fever: No Night sweats?: No Weight loss?: No Fatigue?: No  Skin Skin rash/lesions?: No Itching?: No  Eyes Blurred vision?: No Double vision?: No  Ears/Nose/Throat Sore throat?: No Sinus problems?: No  Hematologic/Lymphatic Swollen glands?: No Easy bruising?: No  Cardiovascular Leg swelling?: No Chest pain?: No  Respiratory Cough?: No Shortness of breath?: No  Endocrine Excessive thirst?: No  Musculoskeletal Back pain?: No Joint pain?: No  Neurological Headaches?: No Dizziness?: No  Psychologic Depression?: No Anxiety?: No  Physical Exam: BP 130/76 (BP Location: Right Arm, Patient Position: Sitting, Cuff Size: Normal)   Pulse 97   Ht 5\' 6"  (1.676 m)   Wt 125 lb 6.4  oz (56.9 kg)   BMI 20.24 kg/m   Constitutional:  Well nourished. Alert and oriented, No acute distress. HEENT: [place] AT, moist mucus membranes.  Trachea midline, no masses. Cardiovascular: No clubbing, cyanosis, or edema. Respiratory: Normal respiratory effort, no increased work of breathing. GI: Abdomen is soft, non tender, non distended, no abdominal masses. Liver and spleen not palpable.  No hernias appreciated.  Stool sample for occult testing is not indicated.   GU: No CVA tenderness.  No bladder fullness or masses.  Patient with uncircumcised phallus.   Foreskin easily retracted.   Balanitis is present.  Urethral meatus is patent.  No penile discharge. No penile lesions or rashes. Scrotum without lesions, cysts, rashes and/or edema.  Testicles are located scrotally bilaterally. No masses are appreciated in the testicles. Left and right epididymis are normal. Rectal: Not performed.     Skin: No rashes, bruises or suspicious lesions. Lymph: No cervical or inguinal adenopathy. Neurologic: Grossly intact, no focal deficits, moving all 4 extremities. Psychiatric: Normal mood and affect.  Laboratory Data: Lab Results  Component Value Date   WBC 6.7 07/13/2017   HGB 11.2 (L) 07/13/2017   HCT 32.7 (L) 07/13/2017   MCV 87.5 07/13/2017   PLT 352 07/13/2017    Lab Results  Component Value Date   CREATININE 0.95 07/13/2017    No results found for: PSA  No results found for: TESTOSTERONE  Lab Results  Component Value Date   HGBA1C 9.3 04/05/2017    No results found for: TSH  No results found for: CHOL, HDL, CHOLHDL, VLDL, LDLCALC  Lab Results  Component Value Date   AST 26 07/13/2017   Lab Results  Component Value Date   ALT 22 07/13/2017   No components found for: ALKALINEPHOPHATASE No components found for: BILIRUBINTOTAL  No results found for: ESTRADIOL  Urinalysis    Component Value Date/Time   COLORURINE YELLOW (A) 05/17/2017 1228   APPEARANCEUR Cloudy (A) 08/06/2017 1051   LABSPEC 1.012 05/17/2017 1228   PHURINE 7.0 05/17/2017 1228   GLUCOSEU 3+ (A) 08/06/2017 1051   HGBUR SMALL (A) 05/17/2017 1228   BILIRUBINUR Negative 08/06/2017 1051   KETONESUR NEGATIVE 05/17/2017 1228   PROTEINUR 2+ (A) 08/06/2017 1051   PROTEINUR 30 (A) 05/17/2017 1228   UROBILINOGEN 0.2 04/05/2017 1214   NITRITE Positive (A) 08/06/2017 1051   NITRITE POSITIVE (A) 05/17/2017 1228   LEUKOCYTESUR 1+ (A) 08/06/2017 1051    I have reviewed the labs.  Assessment & Plan:    1. Balanitis Patient prescribed Mycolog cream II bid He is instructed to pull back his foreskin and wash the glans of his penis with soapy water and dry it completely before applying the Mycolog cream.  This needs to be done twice daily. He will return in 1 week for recheck  I also discussed with the daughter that due to his ureteral stent placements his urine would be difficult to evaluate  for infection and if he started to experience fevers, suprapubic pain, flank pain or blood in the urine to contact our office.  It is not advisable to look at the urine randomly when the patient is not having symptoms.    Return in about 1 week (around 08/15/2017) for recheck; quick visit put anywhere .  These notes generated with voice recognition software. I apologize for typographical errors.  Zara Council, PA-C  [facility] [address]  [place], [place] [address] [phone]

## 2017-08-08 NOTE — Telephone Encounter (Signed)
Per Sonia Baller, NP - proceed with ordering u/a and urine culture

## 2017-08-08 NOTE — Telephone Encounter (Signed)
Patient actually had ua and urine culture done by Dr Erlene Quan on the 17th and he is positive for yeast, bacteria and blood, the culture report is not back yet. I advised Ms Trumbull that she should contact D rBrandon with her concerns since she should have resuls of these tests

## 2017-08-08 NOTE — Telephone Encounter (Signed)
Red and swollen penis needs to be seen and evaluated.  This could be paraphimosis if he is uncircumcised.  Please arrange for him to be seen this afternoon if possible as a work in. Looks like Larene Beach has some time this afternoon.    [name], MD

## 2017-08-09 ENCOUNTER — Telehealth: Payer: Self-pay | Admitting: Pharmacist

## 2017-08-09 ENCOUNTER — Telehealth: Payer: Self-pay | Admitting: Urology

## 2017-08-09 NOTE — Telephone Encounter (Signed)
Oral Chemotherapy Pharmacist Encounter   Attempted to reach patient's daughter for follow up on oral medication: Xtandi (enzalutamide). No answer. Left VM for patient's daughter to call back.    Darl Pikes, PharmD, BCPS Hematology/Oncology Clinical Pharmacist [facility] [phone]  08/09/2017 12:25 PM

## 2017-08-09 NOTE — Telephone Encounter (Signed)
Patient seen on 08/06/2017 for cath removal.  Is a little unclear to me why a UA/urine culture was sent on this day.  As anticipated, he is colonized with bacteria.  He grew E. coli this time.    This absolutely is not indication to treat this.  He should only be treated if he is symptomatic i.e. fevers, chills, lethargy.  He self caths daily and has indwelling bilateral ureteral stents.  I am not going to prescribe antibiotics at this time.  Continue to prescribe antibiotics when he is otherwise asymptomatic we will breed resistance and cause issues down the road.   [name], MD

## 2017-08-10 NOTE — Telephone Encounter (Signed)
Called pt's daughter informed her of the information below, pt is not symptomatic at this time. Daughter have verbal understanding. Daughter also states that they need the order placed for catheters, she states pt is cathing 2 times daily. Will place order.

## 2017-08-13 ENCOUNTER — Ambulatory Visit: Payer: Medicare Other

## 2017-08-13 ENCOUNTER — Ambulatory Visit
Admission: RE | Admit: 2017-08-13 | Discharge: 2017-08-13 | Disposition: A | Discharge status: Home / Self Care | Payer: Medicare Other | Source: Ambulatory Visit | Attending: Internal Medicine | Admitting: Internal Medicine

## 2017-08-13 DIAGNOSIS — C61 Malignant neoplasm of prostate: Secondary | ICD-10-CM

## 2017-08-13 DIAGNOSIS — N1330 Unspecified hydronephrosis: Secondary | ICD-10-CM | POA: Diagnosis not present

## 2017-08-13 DIAGNOSIS — C7951 Secondary malignant neoplasm of bone: Secondary | ICD-10-CM | POA: Diagnosis not present

## 2017-08-13 HISTORY — DX: Malignant neoplasm of prostate: C61

## 2017-08-13 MED ORDER — IOPAMIDOL (ISOVUE-300) INJECTION 61%
100.0000 mL | Freq: Once | INTRAVENOUS | Status: AC | PRN
  Administered 2017-08-13: 100 mL via INTRAVENOUS

## 2017-08-14 NOTE — Progress Notes (Signed)
The patient is having difficulty passing a straight tip catheter through the urethra, and a coude catheter is required to conform to the patient's anatomy.

## 2017-08-14 NOTE — Progress Notes (Signed)
08/15/2017 8:05 PM   Michael [name] Nov 26, 1933 [id number]  Referring provider: [name], [facility] [place], [place] [address]  Chief Complaint  Patient presents with  . Follow-up    HPI: 84 yo male who present with his daughter, [name], for recheck.  Background history Patient is an 84 year old male who presents today with his daughter, [name], with the complaint of a red and swollen penis.  Patient's daughter states that he is been having pain and swelling in his penis before the Foley catheter was removed.  She is a Best boy has been dipping his urine and looking at his urine underneath the microscope.  She states that she sees yeast in his urine specimen.   Patient denies any gross hematuria, dysuria or suprapubic/flank pain.  Patient denies any fevers, chills, nausea or vomiting.  He states he has been voiding every 2 hours since the catheter was removed.  He is self cathing volumes of 75 cc.  He is about twice daily.    He has a history of metastatic prostate cancer with bulky retroperitoneal adenopathy, visceral, and bone disease who is under the care of Dr. Rogue Bussing and on ADT/Xtandi.    Their insurance would not cover the Mycolog cream, so they contacted the PCP and they called in the nystatin powder.  He has been using it twice daily and has noticed an improvement.  He is no longer having pain or swelling in the penile area.  She states he is having urinary frequency urinating every 2 hours.  Patient denies any gross hematuria, dysuria or suprapubic/flank pain.  Patient denies any fevers, chills, nausea or vomiting.    PMH: Past Medical History:  Diagnosis Date  . Benign prostatic hypertrophy   . Bilateral carotid artery disease (HCC)    Mild plaque formation without obstructive disease noted on carotid Doppler.  . Coronary artery disease    Previous cardiac catheterization at [facility] in 2010. The patient was told  about 2 blockages which did not require revascularization.  . Diabetes mellitus without complication ([place])   . Essential hypertension   . GERD (gastroesophageal reflux disease)   . Hyperlipidemia   . Hypertension   . Left carotid artery stenosis   . Prostate cancer ([place]) 03/2017    Surgical History: Past Surgical History:  Procedure Laterality Date  . CARDIAC CATHETERIZATION  2010  . CARDIAC CATHETERIZATION N/A 02/03/2015   Procedure: Left Heart Cath and Coronary Angiography;  Surgeon: Wellington [name], MD;  Location: [facility];  Service: Cardiovascular;  Laterality: N/A;  . CATARACT EXTRACTION    . CYSTOSCOPY W/ RETROGRADES Bilateral 04/11/2017   Procedure: CYSTOSCOPY WITH RETROGRADE PYELOGRAM;  Surgeon: [name], MD;  Location: [facility];  Service: Urology;  Laterality: Bilateral;  . CYSTOSCOPY WITH STENT PLACEMENT Left 04/11/2017   Procedure: CYSTOSCOPY WITH STENT PLACEMENT and fulgeration;  Surgeon: [name], MD;  Location: [facility];  Service: Urology;  Laterality: Left;  . CYSTOSCOPY WITH URETHRAL DILATATION Bilateral 04/11/2017   Procedure: CYSTOSCOPY WITH URETHRAL DILATATION;  Surgeon: [name], MD;  Location: [facility];  Service: Urology;  Laterality: Bilateral;  . IR CONVERT RIGHT NEPHROSTOMY TO NEPHROURETERAL CATH  05/21/2017  . IR NEPHROSTOMY PLACEMENT RIGHT  04/13/2017  . PROSTATECTOMY      Home Medications:  Allergies as of 08/15/2017      Reactions   Metformin    Other reaction(s): Vomiting      Medication List  Accurate as of 08/15/17 11:59 PM. Always use your most recent med list.          acetaminophen 500 MG tablet Commonly known as:  TYLENOL Take 500 mg by mouth every 4 (four) hours as needed for moderate pain or fever.   atorvastatin 10 MG tablet Commonly known as:  LIPITOR Take 10 mg by mouth daily.   CALCIUM 600+D3 600-800 MG-UNIT Tabs Generic drug:  Calcium Carb-Cholecalciferol Take 1 tablet by mouth daily.     feeding supplement (GLUCERNA SHAKE) Liqd Take 237 mLs by mouth 3 (three) times daily between meals.   glimepiride 4 MG tablet Commonly known as:  AMARYL   glucose blood test strip Commonly known as:  ACCU-CHEK GUIDE Use asdirected three times a day diag E11.65   ibuprofen 200 MG tablet Commonly known as:  ADVIL,MOTRIN Take 200 mg by mouth every 4 (four) hours as needed for fever or moderate pain.   insulin glargine 100 UNIT/ML injection Commonly known as:  LANTUS Inject 14-16 Units into the skin at bedtime. Based on sugar levels in am.   JARDIANCE 25 MG Tabs tablet Generic drug:  empagliflozin Take 25 mg by mouth daily.   lisinopril 2.5 MG tablet Commonly known as:  PRINIVIL,ZESTRIL Take 2.5 mg by mouth daily.   metoprolol succinate 25 MG 24 hr tablet Commonly known as:  TOPROL-XL Take 25 mg by mouth daily.   mirabegron ER 25 MG Tb24 tablet Commonly known as:  MYRBETRIQ Take 1 tablet (25 mg total) by mouth daily.   multivitamin with minerals tablet Take 1 tablet by mouth daily.   nitrofurantoin (macrocrystal-monohydrate) 100 MG capsule Commonly known as:  MACROBID Take 100 mg by mouth 2 (two) times daily.   nystatin powder Commonly known as:  MYCOSTATIN/NYSTOP APPLY TO AFFECTED AREA TWICE A DAY   nystatin-triamcinolone ointment Commonly known as:  MYCOLOG Apply 1 application topically 2 (two) times daily.   omeprazole 20 MG capsule Commonly known as:  PRILOSEC TAKE ONE CAPSULE BY MOUTH EVERY DAY   ondansetron 8 MG tablet Commonly known as:  ZOFRAN One pill every 8 hours as needed for nausea/vomitting.   prochlorperazine 10 MG tablet Commonly known as:  COMPAZINE Take 1 tablet (10 mg total) by mouth every 6 (six) hours as needed for nausea or vomiting.   XTANDI 40 MG capsule Generic drug:  enzalutamide TAKE 4 CAPSULES (160 MG TOTAL) BY MOUTH DAILY.       Allergies:  Allergies  Allergen Reactions  . Metformin     Other reaction(s): Vomiting     Family History: Family History  Problem Relation Age of Onset  . Hypertension Father     Social History:  reports that he has never smoked. He has never used smokeless tobacco. He reports that he does not drink alcohol or use drugs.  ROS: UROLOGY Frequent Urination?: Yes Hard to postpone urination?: Yes Burning/pain with urination?: Yes Get up at night to urinate?: Yes Leakage of urine?: No Urine stream starts and stops?: No Trouble starting stream?: No Do you have to strain to urinate?: No Blood in urine?: No Urinary tract infection?: No Sexually transmitted disease?: No Injury to kidneys or bladder?: No Painful intercourse?: No Weak stream?: No Erection problems?: No Penile pain?: No  Gastrointestinal Nausea?: No Vomiting?: No Indigestion/heartburn?: No Diarrhea?: No Constipation?: No  Constitutional Fever: No Night sweats?: No Weight loss?: No Fatigue?: No  Skin Skin rash/lesions?: No Itching?: No  Eyes Blurred vision?: No Double vision?: No  Ears/Nose/Throat Sore  throat?: No Sinus problems?: No  Hematologic/Lymphatic Swollen glands?: No Easy bruising?: No  Cardiovascular Leg swelling?: No Chest pain?: No  Respiratory Cough?: No Shortness of breath?: No  Endocrine Excessive thirst?: No  Musculoskeletal Back pain?: No Joint pain?: No  Neurological Headaches?: No Dizziness?: No  Psychologic Depression?: No Anxiety?: No  Physical Exam: BP 124/77 (BP Location: Right Arm, Patient Position: Sitting, Cuff Size: Normal)   Pulse (!) 104   Ht 5\' 6"  (1.676 m)   Wt 122 lb 11.2 oz (55.7 kg)   BMI 19.80 kg/m   Constitutional: Well nourished. Alert and oriented, No acute distress. HEENT: [place] AT, moist mucus membranes. Trachea midline, no masses. Cardiovascular: No clubbing, cyanosis, or edema. Respiratory: Normal respiratory effort, no increased work of breathing. GI: Abdomen is soft, non tender, non distended, no abdominal masses.  Liver and spleen not palpable.  No hernias appreciated.  Stool sample for occult testing is not indicated.   GU: No CVA tenderness.  No bladder fullness or masses.  Patient with uncircumcised phallus.  Foreskin easily retracted.  Balanitis resolved.  Urethral meatus is patent.  No penile discharge. No penile lesions or rashes. Scrotum without lesions, cysts, rashes and/or edema.  Testicles are located scrotally bilaterally. No masses are appreciated in the testicles. Left and right epididymis are normal. Rectal: Not performed.   Skin: No rashes, bruises or suspicious lesions. Lymph: No cervical or inguinal adenopathy. Neurologic: Grossly intact, no focal deficits, moving all 4 extremities. Psychiatric: Normal mood and affect.   Laboratory Data: Lab Results  Component Value Date   WBC 7.7 08/16/2017   HGB 11.3 (L) 08/16/2017   HCT 33.4 (L) 08/16/2017   MCV 84.9 08/16/2017   PLT 368 08/16/2017    Lab Results  Component Value Date   CREATININE 1.09 08/16/2017    No results found for: PSA  No results found for: TESTOSTERONE  Lab Results  Component Value Date   HGBA1C 9.3 04/05/2017    No results found for: TSH  No results found for: CHOL, HDL, CHOLHDL, VLDL, LDLCALC  Lab Results  Component Value Date   AST 24 08/16/2017   Lab Results  Component Value Date   ALT 25 08/16/2017   No components found for: ALKALINEPHOPHATASE No components found for: BILIRUBINTOTAL  No results found for: ESTRADIOL  Urinalysis    Component Value Date/Time   COLORURINE YELLOW (A) 05/17/2017 1228   APPEARANCEUR Cloudy (A) 08/06/2017 1051   LABSPEC 1.012 05/17/2017 1228   PHURINE 7.0 05/17/2017 1228   GLUCOSEU 3+ (A) 08/06/2017 1051   HGBUR SMALL (A) 05/17/2017 1228   BILIRUBINUR Negative 08/06/2017 1051   KETONESUR NEGATIVE 05/17/2017 1228   PROTEINUR 2+ (A) 08/06/2017 1051   PROTEINUR 30 (A) 05/17/2017 1228   UROBILINOGEN 0.2 04/05/2017 1214   NITRITE Positive (A) 08/06/2017 1051    NITRITE POSITIVE (A) 05/17/2017 1228   LEUKOCYTESUR 1+ (A) 08/06/2017 1051    I have reviewed the labs.  Assessment & Plan:    1. Balanitis Resolved.    2. Prostate cancer Seeing Dr. Rogue Bussing tomorrow   3. Bilateral hydronephrosis ? Stent removal   4. Ureteral stent discomfort Myrbetriq 25 mg samples given, #28   Return for pending oncology .  These notes generated with voice recognition software. I apologize for typographical errors.  Zara Council, PA-C  [facility] [address]  [place], [place] [address] [phone]

## 2017-08-15 ENCOUNTER — Encounter: Payer: Self-pay | Admitting: Urology

## 2017-08-15 ENCOUNTER — Telehealth: Payer: Self-pay | Admitting: Family Medicine

## 2017-08-15 ENCOUNTER — Ambulatory Visit (INDEPENDENT_AMBULATORY_CARE_PROVIDER_SITE_OTHER): Payer: Medicare Other | Admitting: Urology

## 2017-08-15 VITALS — BP 124/77 | HR 104 | Ht 66.0 in | Wt 122.7 lb

## 2017-08-15 DIAGNOSIS — N1330 Unspecified hydronephrosis: Secondary | ICD-10-CM

## 2017-08-15 DIAGNOSIS — N481 Balanitis: Secondary | ICD-10-CM

## 2017-08-15 DIAGNOSIS — I2510 Atherosclerotic heart disease of native coronary artery without angina pectoris: Secondary | ICD-10-CM

## 2017-08-15 DIAGNOSIS — C61 Malignant neoplasm of prostate: Secondary | ICD-10-CM

## 2017-08-15 MED ORDER — MIRABEGRON ER 25 MG PO TB24: 25.0000 mg | ORAL_TABLET | Freq: Every day | ORAL | 0 refills | Status: DC

## 2017-08-15 NOTE — Telephone Encounter (Signed)
Patient has chronic retention and requires a coude due to inability to pass a straight catheter. He cath's 5 times daily.

## 2017-08-16 ENCOUNTER — Telehealth: Payer: Self-pay | Admitting: Pharmacist

## 2017-08-16 ENCOUNTER — Inpatient Hospital Stay: Payer: Medicare Other

## 2017-08-16 ENCOUNTER — Other Ambulatory Visit: Payer: Self-pay

## 2017-08-16 ENCOUNTER — Inpatient Hospital Stay: Payer: Medicare Other | Attending: Internal Medicine

## 2017-08-16 ENCOUNTER — Inpatient Hospital Stay (HOSPITAL_BASED_OUTPATIENT_CLINIC_OR_DEPARTMENT_OTHER): Payer: Medicare Other | Admitting: Internal Medicine

## 2017-08-16 ENCOUNTER — Encounter: Payer: Self-pay | Admitting: Internal Medicine

## 2017-08-16 VITALS — BP 134/83 | HR 94 | Temp 97.9°F | Resp 18 | Ht 66.0 in | Wt 122.0 lb

## 2017-08-16 DIAGNOSIS — C7951 Secondary malignant neoplasm of bone: Secondary | ICD-10-CM | POA: Insufficient documentation

## 2017-08-16 DIAGNOSIS — K219 Gastro-esophageal reflux disease without esophagitis: Secondary | ICD-10-CM | POA: Diagnosis not present

## 2017-08-16 DIAGNOSIS — I2510 Atherosclerotic heart disease of native coronary artery without angina pectoris: Secondary | ICD-10-CM | POA: Insufficient documentation

## 2017-08-16 DIAGNOSIS — I10 Essential (primary) hypertension: Secondary | ICD-10-CM | POA: Insufficient documentation

## 2017-08-16 DIAGNOSIS — I6522 Occlusion and stenosis of left carotid artery: Secondary | ICD-10-CM

## 2017-08-16 DIAGNOSIS — Z191 Hormone sensitive malignancy status: Secondary | ICD-10-CM | POA: Insufficient documentation

## 2017-08-16 DIAGNOSIS — Z79899 Other long term (current) drug therapy: Secondary | ICD-10-CM

## 2017-08-16 DIAGNOSIS — C7911 Secondary malignant neoplasm of bladder: Secondary | ICD-10-CM

## 2017-08-16 DIAGNOSIS — E1165 Type 2 diabetes mellitus with hyperglycemia: Secondary | ICD-10-CM | POA: Insufficient documentation

## 2017-08-16 DIAGNOSIS — C61 Malignant neoplasm of prostate: Secondary | ICD-10-CM

## 2017-08-16 LAB — COMPREHENSIVE METABOLIC PANEL
ALT: 25 U/L (ref 0–44)
AST: 24 U/L (ref 15–41)
Albumin: 3.4 g/dL — ABNORMAL LOW (ref 3.5–5.0)
Alkaline Phosphatase: 103 U/L (ref 38–126)
Anion gap: 9 (ref 5–15)
BUN: 29 mg/dL — ABNORMAL HIGH (ref 8–23)
CO2: 22 mmol/L (ref 22–32)
Calcium: 8.7 mg/dL — ABNORMAL LOW (ref 8.9–10.3)
Chloride: 100 mmol/L (ref 98–111)
Creatinine, Ser: 1.09 mg/dL (ref 0.61–1.24)
GFR calc Af Amer: >60 mL/min (ref >60)
GFR calc non Af Amer: >60 mL/min (ref >60)
Glucose, Bld: 286 mg/dL — ABNORMAL HIGH (ref 70–99)
Potassium: 4.5 mmol/L (ref 3.5–5.1)
Sodium: 131 mmol/L — ABNORMAL LOW (ref 135–145)
Total Bilirubin: 0.3 mg/dL (ref 0.3–1.2)
Total Protein: 7.5 g/dL (ref 6.5–8.1)

## 2017-08-16 LAB — CBC WITH DIFFERENTIAL/PLATELET
Basophils Absolute: 0 10*3/uL (ref 0–0.1)
Basophils Relative: 1 %
Eosinophils Absolute: 0 10*3/uL (ref 0–0.7)
Eosinophils Relative: 0 %
HCT: 33.4 % — ABNORMAL LOW (ref 40.0–52.0)
Hemoglobin: 11.3 g/dL — ABNORMAL LOW (ref 13.0–18.0)
Lymphocytes Relative: 16 %
Lymphs Abs: 1.3 10*3/uL (ref 1.0–3.6)
MCH: 28.7 pg (ref 26.0–34.0)
MCHC: 33.8 g/dL (ref 32.0–36.0)
MCV: 84.9 fL (ref 80.0–100.0)
Monocytes Absolute: 0.9 10*3/uL (ref 0.2–1.0)
Monocytes Relative: 12 %
Neutro Abs: 5.5 10*3/uL (ref 1.4–6.5)
Neutrophils Relative %: 71 %
Platelets: 368 10*3/uL (ref 150–440)
RBC: 3.94 MIL/uL — ABNORMAL LOW (ref 4.40–5.90)
RDW: 14.6 % — ABNORMAL HIGH (ref 11.5–14.5)
WBC: 7.7 10*3/uL (ref 3.8–10.6)

## 2017-08-16 LAB — PSA: Prostatic Specific Antigen: 0.12 ng/mL (ref 0.00–4.00)

## 2017-08-16 MED ORDER — DEGARELIX ACETATE 80 MG ~~LOC~~ SOLR
80.0000 mg | Freq: Once | SUBCUTANEOUS | Status: AC
  Administered 2017-08-16: 80 mg via SUBCUTANEOUS
  Filled 2017-08-16: qty 4

## 2017-08-16 MED FILL — XTANDI 40 MG CAPSULE: 40 | 30 days supply | Qty: 120 | Fill #1

## 2017-08-16 NOTE — Telephone Encounter (Signed)
Oral Chemotherapy Pharmacist Encounter  Follow-Up Form  Called patient today to follow up regarding patient's oral chemotherapy medication: Xtandi (enzalutamide)  Original Start date of oral chemotherapy: 05/2017  Pt reports 0 tablets/doses of Xtandi missed in the last month.  Pt reports the following side effects: per daughter patient experiencing fatigue  New medications?: yes, Jardiance and mirabegron, no DDIs with Gillermina Phy  Other Issues: none reported  Patient knows to call the office with questions or concerns. [facility] Clinic will continue to follow.  Darl Pikes, PharmD, BCPS Hematology/Oncology Clinical Pharmacist [facility] [phone]  08/16/2017 9:46 AM

## 2017-08-16 NOTE — Assessment & Plan Note (Addendum)
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  On Xtandi; June 2019 CT scan shows significant improvement of prostate/pelvic soft tissue lesions/retroperitoneal lymphadenopathy; and bilateral hydronephrosis.  #Continue degarelix; Xtandi[April 2019]-clinical improvement noted PSA also improved.  Will switch over to Lupron at next visit.   #Obstructive uropathy-status post bilateral stenting; right nephrostomy expanded.  Currently straight cathing.  Discussed with Dr. Algernon Huxley reevaluate for removal of the stents.  #Sclerotic bone mets noted on CT scan-likely healing not new metastases.  # UTI-improved . E.coli s/p macrobid  # shot today; follow up in 1 month/lupron/PSA;labs.  # I reviewed the blood work- with the patient in detail; also reviewed the imaging independently [as summarized above]; and with the patient in detail.    Cc; Dr.brandon.

## 2017-08-16 NOTE — Progress Notes (Signed)
[facility] OFFICE PROGRESS NOTE  Patient Care Team: [name], MD as PCP - General (Internal Medicine)  Cancer Staging No matching staging information was found for the patient.   Oncology History   # FEB 2019-Metastatic prostate cancer/ castrate sensitive [bulky retroperitoneal adenopathy; invasion into the bladder; rectum] Bladder Bx- prostate. On Degarelix  # April 3rd 2019- [name] [133md]  # Poorly controlled DM-on insulin  # s/p right PCN [explanted]/ Foley cath [Dr.Brandon]; multiple UTIs.  --------------------------------------------------------------------------   DIAGNOSIS: [ FEB 2019] MET. PROSTATE CA  STAGE:   IV ;GOALS: PALLIATIVE  CURRENT/MOST RECENT THERAPY [ Feb-April2019] Lupron+ [name]      Prostate cancer ([facility]      INTERVAL HISTORY:  Michael [id number].[id number].  male pleasant patient above history castrate sensitive prostate cancer on XGillermina Phyis here for follow-up/ECT review the results of his CAT scan.  Patient had a episode of UTI which was treated by his PCP with Macrobid.  Currently symptoms resolved.  No nausea no vomiting no diarrhea.  Appetite is good.  Patient continues to straight cath.   Review of Systems  Constitutional: Negative for chills, diaphoresis, fever, malaise/fatigue and weight loss.  HENT: Negative for nosebleeds and sore throat.   Eyes: Negative for double vision.  Respiratory: Negative for cough, hemoptysis, sputum production, shortness of breath and wheezing.   Cardiovascular: Negative for chest pain, palpitations, orthopnea and leg swelling.  Gastrointestinal: Negative for abdominal pain, blood in stool, constipation, diarrhea, heartburn, melena, nausea and vomiting.  Genitourinary: Negative for dysuria, frequency and urgency.  Musculoskeletal: Negative for back pain and joint pain.  Skin: Negative.  Negative for itching and rash.  Neurological: Negative for dizziness, tingling, focal weakness,  weakness and headaches.  Endo/Heme/Allergies: Does not bruise/bleed easily.  Psychiatric/Behavioral: Negative for depression. The patient is not nervous/anxious and does not have insomnia.       PAST MEDICAL HISTORY :  Past Medical History:  Diagnosis Date  . Benign prostatic hypertrophy   . Bilateral carotid artery disease (HCC)    Mild plaque formation without obstructive disease noted on carotid Doppler.  . Coronary artery disease    [facility] The patient was told about 2 blockages which did not require revascularization.  . Diabetes mellitus without complication ([facility]   . Essential hypertension   . GERD (gastroesophageal reflux disease)   . Hyperlipidemia   . Hypertension   . Left carotid artery stenosis   . [place] [place]    PAST SURGICAL HISTORY :   Past Surgical History:  Procedure Laterality Date  . CARDIAC CATHETERIZATION  2010  . CARDIAC CATHETERIZATION N/A 02/03/2015   Procedure: Left Heart Cath and Coronary Angiography;  Surgeon: MWellington [name] MD;  [facility];  Service: Cardiovascular;  Laterality: N/A;  . CATARACT EXTRACTION    . CYSTOSCOPY W/ RETROGRADES Bilateral 04/11/2017   Procedure: CYSTOSCOPY WITH RETROGRADE PYELOGRAM;  Surgeon: BHollice Espy MD;  Location: [facility] OR[facility];  Service: Urology;  Laterality: Bilateral;  . CYSTOSCOPY WITH STENT PLACEMENT Left 04/11/2017   Procedure: CYSTOSCOPY WITH STENT PLACEMENT and fulgeration;  Surgeon: BHollice Espy MD;  Location: [facility] OR[facility];  Service: Urology;  Laterality: Left;  . CYSTOSCOPY WITH URETHRAL DILATATION Bilateral 04/11/2017   Procedure: CYSTOSCOPY WITH URETHRAL DILATATION;  Surgeon: BHollice Espy MD;  Location: [facility] OR[facility];  Service: Urology;  Laterality: Bilateral;  . IR CONVERT RIGHT NEPHROSTOMY TO NEPHROURETERAL CATH  05/21/2017  . IR NEPHROSTOMY PLACEMENT RIGHT  04/13/2017  . PROSTATECTOMY      FAMILY HISTORY :   Family History   Problem Relation Age of Onset  . Hypertension Father     SOCIAL HISTORY:   Social History   Tobacco Use  . Smoking status: Never Smoker  . Smokeless tobacco: Never Used  Substance Use Topics  . Alcohol use: No  . Drug use: No    ALLERGIES:  is allergic to metformin.  MEDICATIONS:  Current Outpatient Medications  Medication Sig Dispense Refill  . acetaminophen (TYLENOL) 500 MG tablet Take 500 mg by mouth every 4 (four) hours as needed for moderate pain or fever.    [name] atorvastatin (LIPITOR) 10 MG tablet Take 10 mg by mouth daily.    . Calcium Carb-Cholecalciferol (CALCIUM 600+D3) 600-800 MG-UNIT TABS Take 1 tablet by mouth daily.    . feeding supplement, GLUCERNA SHAKE, (GLUCERNA SHAKE) LIQD Take 237 mLs by mouth 3 (three) times daily between meals. 90 Can 5  . glimepiride (AMARYL) 4 MG tablet     . glucose blood (ACCU-CHEK GUIDE) test strip Use asdirected three times a day diag E11.65 100 each 3  . ibuprofen (ADVIL,MOTRIN) 200 MG tablet Take 200 mg by mouth every 4 (four) hours as needed for fever or moderate pain.    [name] insulin glargine (LANTUS) 100 UNIT/ML injection Inject 14-16 Units into the skin at bedtime. Based on sugar levels in am.    . JARDIANCE 25 MG TABS tablet Take 25 mg by mouth daily.  3  . lisinopril (PRINIVIL,ZESTRIL) 2.5 MG tablet Take 2.5 mg by mouth daily.  4  . metoprolol succinate (TOPROL-XL) 25 MG 24 hr tablet Take 25 mg by mouth daily.   6  . mirabegron ER (MYRBETRIQ) 25 MG TB24 tablet Take 1 tablet (25 mg total) by mouth daily. 30 tablet 0  . Multiple Vitamins-Minerals (MULTIVITAMIN WITH MINERALS) tablet Take 1 tablet by mouth daily.     . nitrofurantoin, macrocrystal-monohydrate, (MACROBID) 100 MG capsule Take 100 mg by mouth 2 (two) times daily.  0  . nystatin (MYCOSTATIN/NYSTOP) powder APPLY TO AFFECTED AREA TWICE A DAY  1  . nystatin-triamcinolone ointment (MYCOLOG) Apply 1 application topically 2 (two) times daily. 30 g 0  . omeprazole (PRILOSEC) 20  MG capsule TAKE ONE CAPSULE BY MOUTH EVERY DAY 90 capsule 2  . ondansetron (ZOFRAN) 8 MG tablet One pill every 8 hours as needed for nausea/vomitting. 40 tablet 1  . prochlorperazine (COMPAZINE) 10 MG tablet Take 1 tablet (10 mg total) by mouth every 6 (six) hours as needed for nausea or vomiting. 40 tablet 1  . XTANDI 40 MG capsule TAKE 4 CAPSULES (160 MG TOTAL) BY MOUTH DAILY. 120 capsule 3   No current facility-administered medications for this visit.     PHYSICAL EXAMINATION: ECOG PERFORMANCE STATUS: 0 - Asymptomatic  BP 134/83   Pulse 94   Temp 97.9 F (36.6 C) (Tympanic)   Resp 18   Ht 5' 6" (1.676 m)   Wt 122 lb (55.3 kg)   BMI 19.69 kg/m   Filed Weights   08/16/17 0930  Weight: 122 lb (55.3 kg)    GENERAL: Well-nourished well-developed; Alert, no distress and comfortable. \Accompanied by family.  EYES: no pallor or icterus OROPHARYNX: no thrush or ulceration; NECK: supple; no lymph nodes felt. LYMPH:  no palpable lymphadenopathy in the axillary or inguinal regions LUNGS: Decreased breath sounds auscultation bilaterally. No wheeze or crackles HEART/CVS: regular rate & rhythm and no murmurs; No lower extremity  edema ABDOMEN:abdomen soft, non-tender and normal bowel sounds. No hepatomegaly or splenomegaly.  Musculoskeletal:no cyanosis of digits and no clubbing  PSYCH: alert & oriented x 3 with fluent speech NEURO: no focal motor/sensory deficits SKIN:  no rashes or significant lesions    LABORATORY DATA:  I have reviewed the data as listed    Component Value Date/Time   NA 131 (L) 08/16/2017 0902   NA 136 05/07/2012 1328   K 4.5 08/16/2017 0902   K 4.5 05/07/2012 1328   CL 100 08/16/2017 0902   CL 104 05/07/2012 1328   CO2 22 08/16/2017 0902   CO2 28 05/07/2012 1328   GLUCOSE 286 (H) 08/16/2017 0902   GLUCOSE 73 05/07/2012 1328   BUN 29 (H) 08/16/2017 0902   BUN 16 05/07/2012 1328   CREATININE 1.09 08/16/2017 0902   CREATININE 0.84 05/07/2012 1328    CALCIUM 8.7 (L) 08/16/2017 0902   CALCIUM 8.8 05/07/2012 1328   PROT 7.5 08/16/2017 0902   ALBUMIN 3.4 (L) 08/16/2017 0902   AST 24 08/16/2017 0902   ALT 25 08/16/2017 0902   ALKPHOS 103 08/16/2017 0902   BILITOT 0.3 08/16/2017 0902   GFRNONAA >60 08/16/2017 0902   GFRNONAA >60 05/07/2012 1328   GFRAA >60 08/16/2017 0902   GFRAA >60 05/07/2012 1328    No results found for: SPEP, UPEP  Lab Results  Component Value Date   WBC 7.7 08/16/2017   NEUTROABS 5.5 08/16/2017   HGB 11.3 (L) 08/16/2017   HCT 33.4 (L) 08/16/2017   MCV 84.9 08/16/2017   PLT 368 08/16/2017      Chemistry      Component Value Date/Time   NA 131 (L) 08/16/2017 0902   NA 136 05/07/2012 1328   K 4.5 08/16/2017 0902   K 4.5 05/07/2012 1328   CL 100 08/16/2017 0902   CL 104 05/07/2012 1328   CO2 22 08/16/2017 0902   CO2 28 05/07/2012 1328   BUN 29 (H) 08/16/2017 0902   BUN 16 05/07/2012 1328   CREATININE 1.09 08/16/2017 0902   CREATININE 0.84 05/07/2012 1328      Component Value Date/Time   CALCIUM 8.7 (L) 08/16/2017 0902   CALCIUM 8.8 05/07/2012 1328   ALKPHOS 103 08/16/2017 0902   AST 24 08/16/2017 0902   ALT 25 08/16/2017 0902   BILITOT 0.3 08/16/2017 0902       RADIOGRAPHIC STUDIES: I have personally reviewed the radiological images as listed and agreed with the findings in the report. No results found.   ASSESSMENT & PLAN:  Prostate cancer [facility]) # Metastatic prostate cancer/ castrate sensitive [bulky retroperitoneal adenopathy; invasion into the bladder; rectum].  On Xtandi; June 2019 CT scan shows significant improvement of prostate/pelvic soft tissue lesions/retroperitoneal lymphadenopathy; and bilateral hydronephrosis.  #Continue degarelix; Xtandi[April 2019]-clinical improvement noted PSA also improved.  Will switch over to Lupron at next visit.   #Obstructive uropathy-status post bilateral stenting; right nephrostomy expanded.  Currently straight cathing.  Discussed with Dr.  Algernon Huxley reevaluate for removal of the stents.  #Sclerotic bone mets noted on CT scan-likely healing not new metastases.  # UTI-improved . E.coli s/p macrobid  # shot today; follow up in 1 month/lupron/PSA;labs.  # I reviewed the blood work- with the patient in detail; also reviewed the imaging independently [as summarized above]; and with the patient in detail.    Cc; Dr.brandon.    Orders Placed This Encounter  Procedures  . CBC with Differential/Platelet    Standing Status:   Future  Standing Expiration Date:   08/17/2018  . Comprehensive metabolic panel    Standing Status:   Future    Standing Expiration Date:   08/17/2018  . PSA    Standing Status:   Future    Standing Expiration Date:   08/17/2018   All questions were answered. The patient knows to call the clinic with any problems, questions or concerns.      [name], MD 08/19/2017 12:54 PM

## 2017-08-16 NOTE — Progress Notes (Signed)
Patient c/o fatigue. 

## 2017-08-17 ENCOUNTER — Other Ambulatory Visit: Payer: Self-pay

## 2017-08-17 ENCOUNTER — Ambulatory Visit: Payer: Self-pay | Admitting: Internal Medicine

## 2017-08-17 ENCOUNTER — Ambulatory Visit: Payer: Medicare Other

## 2017-08-17 ENCOUNTER — Ambulatory Visit: Payer: Self-pay

## 2017-08-17 DIAGNOSIS — C61 Malignant neoplasm of prostate: Secondary | ICD-10-CM | POA: Diagnosis not present

## 2017-08-17 DIAGNOSIS — Z794 Long term (current) use of insulin: Secondary | ICD-10-CM | POA: Diagnosis not present

## 2017-08-17 DIAGNOSIS — E1165 Type 2 diabetes mellitus with hyperglycemia: Secondary | ICD-10-CM | POA: Diagnosis not present

## 2017-08-17 DIAGNOSIS — E119 Type 2 diabetes mellitus without complications: Secondary | ICD-10-CM | POA: Diagnosis not present

## 2017-08-19 ENCOUNTER — Other Ambulatory Visit: Payer: Self-pay | Admitting: Internal Medicine

## 2017-08-19 ENCOUNTER — Telehealth: Payer: Self-pay | Admitting: Internal Medicine

## 2017-08-19 NOTE — Telephone Encounter (Signed)
I have ordered Lupron for the next visit. Thx

## 2017-08-20 ENCOUNTER — Telehealth: Payer: Self-pay | Admitting: Radiology

## 2017-08-20 ENCOUNTER — Ambulatory Visit (INDEPENDENT_AMBULATORY_CARE_PROVIDER_SITE_OTHER): Payer: Medicare Other | Admitting: Urology

## 2017-08-20 ENCOUNTER — Encounter: Payer: Self-pay | Admitting: Urology

## 2017-08-20 VITALS — BP 137/84 | HR 102 | Wt 124.0 lb

## 2017-08-20 DIAGNOSIS — N320 Bladder-neck obstruction: Secondary | ICD-10-CM

## 2017-08-20 DIAGNOSIS — R8271 Bacteriuria: Secondary | ICD-10-CM

## 2017-08-20 DIAGNOSIS — C61 Malignant neoplasm of prostate: Secondary | ICD-10-CM | POA: Diagnosis not present

## 2017-08-20 DIAGNOSIS — Z01818 Encounter for other preprocedural examination: Secondary | ICD-10-CM | POA: Diagnosis not present

## 2017-08-20 DIAGNOSIS — I2510 Atherosclerotic heart disease of native coronary artery without angina pectoris: Secondary | ICD-10-CM

## 2017-08-20 DIAGNOSIS — N1330 Unspecified hydronephrosis: Secondary | ICD-10-CM | POA: Diagnosis not present

## 2017-08-20 DIAGNOSIS — N401 Enlarged prostate with lower urinary tract symptoms: Secondary | ICD-10-CM | POA: Diagnosis not present

## 2017-08-20 NOTE — Progress Notes (Signed)
Interpreter ID #[id number]

## 2017-08-20 NOTE — H&P (View-Only) (Signed)
08/20/2017 7:39 PM   ABBIE JABLON 06-15-1933 [id number]  Referring provider: [name], MD [address] [facility] [place], [place] [address]  Chief Complaint  Patient presents with  . Pre-op Exam    HPI: 84 year old male with advanced metastatic prostate cancer managed with bilateral indwelling ureteral stents who returns today following recent repeat staging imaging to discuss results and stent management.  He was initially seen and evaluated during a hospital admission in 03/2017 with urinary retention, acute kidney injury with bilateral hydronephrosis.  He underwent biopsy of his bladder neck revealing prostate cancer and started on ADT.  He required surgical intervention for Foley catheter placement given severe bladder neck contracture at which time a left ureteral stent was placed and ultimately a right PCN.  Right PCN was later converted to indwelling double-J stent.  His Foley is now been removed and he self cathing several times a day to keep his bladder neck open.  He is now under the care of Dr. Rogue Bussing and on ADT/ Gillermina Phy.    Most recently, he underwent repeat staging imaging on 08/13/2017 which showed almost complete resolution of his retro-, abdominal, and pelvic lymphadenopathy.  In addition, he is on regression of the mass involving his prostate and bladder base.  There is no new metastatic disease.  His PSA is a poor biomarker of his disease.  The time of diagnosis his PSA was 9.02.  His most recent PSA was 0.12 as of 08/16/2017.  In terms of urination, he feels that he has a good stream at this point in time.  He does have some mild intermittent dysuria.  He also has urinary frequency and nocturia q. every 2 hours.  No fevers or chills.  He is been self cathing to keep his bladder neck open for volumes less than 100 cc.  Has been doing this twice a day.  He is chronically colonized with bacteria and occasionally with  yeast.    PMH: Past Medical History:  Diagnosis Date  . Benign prostatic hypertrophy   . Bilateral carotid artery disease (HCC)    Mild plaque formation without obstructive disease noted on carotid Doppler.  . Coronary artery disease    Previous cardiac catheterization at [facility] in 2010. The patient was told about 2 blockages which did not require revascularization.  . Diabetes mellitus without complication ([place])   . Essential hypertension   . GERD (gastroesophageal reflux disease)   . Hyperlipidemia   . Hypertension   . Left carotid artery stenosis   . Prostate cancer ([place]) 03/2017    Surgical History: Past Surgical History:  Procedure Laterality Date  . CARDIAC CATHETERIZATION  2010  . CARDIAC CATHETERIZATION N/A 02/03/2015   Procedure: Left Heart Cath and Coronary Angiography;  Surgeon: Wellington [name], MD;  Location: [facility];  Service: Cardiovascular;  Laterality: N/A;  . CATARACT EXTRACTION    . CYSTOSCOPY W/ RETROGRADES Bilateral 04/11/2017   Procedure: CYSTOSCOPY WITH RETROGRADE PYELOGRAM;  Surgeon: [name], MD;  Location: [facility];  Service: Urology;  Laterality: Bilateral;  . CYSTOSCOPY WITH STENT PLACEMENT Left 04/11/2017   Procedure: CYSTOSCOPY WITH STENT PLACEMENT and fulgeration;  Surgeon: [name], MD;  Location: [facility];  Service: Urology;  Laterality: Left;  . CYSTOSCOPY WITH URETHRAL DILATATION Bilateral 04/11/2017   Procedure: CYSTOSCOPY WITH URETHRAL DILATATION;  Surgeon: [name], MD;  Location: [facility];  Service: Urology;  Laterality: Bilateral;  . IR CONVERT RIGHT NEPHROSTOMY TO NEPHROURETERAL CATH  05/21/2017  .  IR NEPHROSTOMY PLACEMENT RIGHT  04/13/2017  . PROSTATECTOMY      Home Medications:  Allergies as of 08/20/2017      Reactions   Metformin    Other reaction(s): Vomiting      Medication List        Accurate as of 08/20/17  7:39 PM. Always use your most recent med list.          acetaminophen 500 MG  tablet Commonly known as:  TYLENOL Take 500 mg by mouth every 4 (four) hours as needed for moderate pain or fever.   atorvastatin 10 MG tablet Commonly known as:  LIPITOR Take 10 mg by mouth daily.   CALCIUM 600+D3 600-800 MG-UNIT Tabs Generic drug:  Calcium Carb-Cholecalciferol Take 1 tablet by mouth daily.   feeding supplement (GLUCERNA SHAKE) Liqd Take 237 mLs by mouth 3 (three) times daily between meals.   glimepiride 4 MG tablet Commonly known as:  AMARYL   glucose blood test strip Commonly known as:  ACCU-CHEK GUIDE Use asdirected three times a day diag E11.65   ibuprofen 200 MG tablet Commonly known as:  ADVIL,MOTRIN Take 200 mg by mouth every 4 (four) hours as needed for fever or moderate pain.   insulin glargine 100 UNIT/ML injection Commonly known as:  LANTUS Inject 14-16 Units into the skin at bedtime. Based on sugar levels in am.   JARDIANCE 25 MG Tabs tablet Generic drug:  empagliflozin Take 25 mg by mouth daily.   lisinopril 2.5 MG tablet Commonly known as:  PRINIVIL,ZESTRIL Take 2.5 mg by mouth daily.   metoprolol succinate 25 MG 24 hr tablet Commonly known as:  TOPROL-XL Take 25 mg by mouth daily.   mirabegron ER 25 MG Tb24 tablet Commonly known as:  MYRBETRIQ Take 1 tablet (25 mg total) by mouth daily.   multivitamin with minerals tablet Take 1 tablet by mouth daily.   nitrofurantoin (macrocrystal-monohydrate) 100 MG capsule Commonly known as:  MACROBID Take 100 mg by mouth 2 (two) times daily.   nystatin powder Commonly known as:  MYCOSTATIN/NYSTOP APPLY TO AFFECTED AREA TWICE A DAY   nystatin-triamcinolone ointment Commonly known as:  MYCOLOG Apply 1 application topically 2 (two) times daily.   omeprazole 20 MG capsule Commonly known as:  PRILOSEC TAKE ONE CAPSULE BY MOUTH EVERY DAY   ondansetron 8 MG tablet Commonly known as:  ZOFRAN One pill every 8 hours as needed for nausea/vomitting.   prochlorperazine 10 MG tablet Commonly  known as:  COMPAZINE Take 1 tablet (10 mg total) by mouth every 6 (six) hours as needed for nausea or vomiting.   XTANDI 40 MG capsule Generic drug:  enzalutamide TAKE 4 CAPSULES (160 MG TOTAL) BY MOUTH DAILY.       Allergies:  Allergies  Allergen Reactions  . Metformin     Other reaction(s): Vomiting    Family History: Family History  Problem Relation Age of Onset  . Hypertension Father     Social History:  reports that he has never smoked. He has never used smokeless tobacco. He reports that he does not drink alcohol or use drugs.  ROS: UROLOGY Frequent Urination?: Yes Hard to postpone urination?: No Burning/pain with urination?: Yes Get up at night to urinate?: Yes Leakage of urine?: No Urine stream starts and stops?: No Trouble starting stream?: No Do you have to strain to urinate?: No Blood in urine?: No Urinary tract infection?: No Sexually transmitted disease?: No Injury to kidneys or bladder?: No Painful intercourse?: No Weak  stream?: No Erection problems?: No Penile pain?: No  Gastrointestinal Nausea?: No Vomiting?: No Indigestion/heartburn?: No Diarrhea?: No Constipation?: No  Constitutional Fever: No Night sweats?: No Weight loss?: No Fatigue?: No  Skin Skin rash/lesions?: No Itching?: No  Eyes Blurred vision?: No Double vision?: No  Ears/Nose/Throat Sore throat?: No Sinus problems?: No  Hematologic/Lymphatic Swollen glands?: No Easy bruising?: No  Cardiovascular Leg swelling?: No Chest pain?: No  Respiratory Cough?: No Shortness of breath?: No  Endocrine Excessive thirst?: No  Musculoskeletal Back pain?: No Joint pain?: No  Neurological Headaches?: No Dizziness?: No  Psychologic Depression?: No Anxiety?: No  Physical Exam: BP 137/84   Pulse (!) 102   Wt 124 lb (56.2 kg)   BMI 20.01 kg/m   Constitutional:  Alert and oriented, No acute distress.  Accompanied by daughter today.  Telephone Hindi translator  used today. HEENT: [place] AT, moist mucus membranes.  Trachea midline, no masses. Cardiovascular: No clubbing, cyanosis, or edema. Respiratory: Normal respiratory effort, no increased work of breathing. Skin: No rashes, bruises or suspicious lesions. Neurologic: Grossly intact, no focal deficits, moving all 4 extremities. Psychiatric: Normal mood and affect.  Laboratory Data: Lab Results  Component Value Date   WBC 7.7 08/16/2017   HGB 11.3 (L) 08/16/2017   HCT 33.4 (L) 08/16/2017   MCV 84.9 08/16/2017   PLT 368 08/16/2017    Lab Results  Component Value Date   CREATININE 1.09 08/16/2017    Lab Results  Component Value Date   HGBA1C 9.3 04/05/2017    Urinalysis UA + nit   Pertinent Imaging: CLINICAL DATA:  Followup metastatic prostate carcinoma. Undergoing chemotherapy.  EXAM: CT ABDOMEN AND PELVIS WITH CONTRAST  TECHNIQUE: Multidetector CT imaging of the abdomen and pelvis was performed using the standard protocol following bolus administration of intravenous contrast.  CONTRAST:  138mL ISOVUE-300 IOPAMIDOL (ISOVUE-300) INJECTION 61%  COMPARISON:  04/11/2017  FINDINGS: Lower Chest: No acute findings.  Hepatobiliary: No hepatic masses identified. Tiny calcified gallstones noted, without evidence of cholecystitis or biliary ductal dilatation.  Pancreas:  No mass or inflammatory changes.  Spleen: Within normal limits in size and appearance.  Adrenals/Urinary Tract: Bilateral ureteral stents are now seen in appropriate position and there has been significant decrease in bilateral hydronephrosis since prior exam. Stable tiny right renal cyst noted. Diffuse bladder wall thickening is seen, however previously seen focal soft tissue mass involving posterior wall and base is no longer visualized.  Stomach/Bowel: No evidence of obstruction, inflammatory process or abnormal fluid collections. Normal appendix visualized. Large amount of stool again seen  throughout the colon.  Vascular/Lymphatic: There has been near complete resolution of diffuse abdominal retroperitoneal lymphadenopathy,, with largest lymph node in the left paraaortic region currently measuring 11 mm on image 38/5 compared to 4.0 cm previously. Diffuse bilateral iliac lymphadenopathy has also nearly completely resolved, with index lymph node in the left external iliac chain on image 68/5 measuring 1.6 cm compared to 3.0 cm previously. Peri-prostatic and perirectal lymphadenopathy is also resolved.  Reproductive: Decreased size of prostate gland since previous study. Previously seen soft tissue masses involving the seminal vesicles and bladder base have resolved since previous exam.  Other:  None.  Musculoskeletal: Diffuse bone metastases show increased sclerosis since prior study, likely due to post treatment response. 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  IMPRESSION: Near complete resolution of metastatic abdominal and pelvic lymphadenopathy since prior study.  Near complete resolution of soft tissue masses involving the prostate and  bladder base since prior exam.  No new or progressive metastatic disease identified.  Increased sclerosis of diffuse bone metastases, likely due to interval response therapy.  Increased diffuse bladder wall thickening, consistent with cystitis.  Significant decrease in bilateral hydronephrosis following ureteral stent placement.   Electronically Signed   By: Earle Gell M.D.   On: 08/13/2017 13:01  CT scan personally reviewed today.  Assessment & Plan:    1. Prostate cancer [facility]) Currently on ADT and Xtandi managed by Dr. Rogue Bussing CT scan reviewed, excellent regression of local abdominal pelvic disease - Urinalysis, Complete - CULTURE, URINE COMPREHENSIVE  2. Bilateral hydronephrosis Management bilateral indwelling ureteral stents Given  excellent response to the above, recommend proceeding to the operating room for stent removal, bilateral retrograde pyelogram and possible ureteral stent removal depending on urinary drainage Both he and his daughter understand that if the kidneys are persistently hydronephrotic or fail to drain promptly, we will replace the stents They are agreeable this plan Urinary symptoms including frequency partially likely related to stent irritation  3. Bladder neck contracture Continue daily self cath acute bladder neck open May reduce from twice daily to once daily as long as catheter passes easily  4. Bacteriuria Chronic urinary bacteriuria Baseline urinary symptoms likely related to multiple factors including prostate cancer, stent irritation, amongst others No evidence of systemic infection We will send urine culture today and likely treat for several days prior to stent exchange to reduce bacterial load With the patient and his daughter are agreeable with this plan   [name], MD  [facility] [address], [place], [place] [address] [phone]

## 2017-08-20 NOTE — Telephone Encounter (Signed)
-----   Message from [name], MD sent at 08/19/2017  3:47 PM EDT ----- Regarding: apt with me This patient needs an appointment with me in the near future with me with UA/ UCx to discuss returning to the operating room for cystoscopy, possible stent removal, bilateral retrogrades.  [name], MD

## 2017-08-20 NOTE — Telephone Encounter (Signed)
Made daughter aware of appointment made to discuss surgery. No questions at this time. Daughter voices understanding.

## 2017-08-20 NOTE — Progress Notes (Signed)
08/20/2017 7:39 PM   GAYLEN VENNING [birth year] [id number]  Referring provider: [name], MD [address] [facility] [place], [place] [address]  Chief Complaint  Patient presents with  . Pre-op Exam    HPI: 84 year old male with advanced metastatic prostate cancer managed with bilateral indwelling ureteral stents who returns today following recent repeat staging imaging to discuss results and stent management.  He was initially seen and evaluated during a hospital admission in 03/2017 with urinary retention, acute kidney injury with bilateral hydronephrosis.  He underwent biopsy of his bladder neck revealing prostate cancer and started on ADT.  He required surgical intervention for Foley catheter placement given severe bladder neck contracture at which time a left ureteral stent was placed and ultimately a right PCN.  Right PCN was later converted to indwelling double-J stent.  His Foley is now been removed and he self cathing several times a day to keep his bladder neck open.  He is now under the care of Dr. Rogue Bussing and on ADT/ Gillermina Phy.    Most recently, he underwent repeat staging imaging on 08/13/2017 which showed almost complete resolution of his retro-, abdominal, and pelvic lymphadenopathy.  In addition, he is on regression of the mass involving his prostate and bladder base.  There is no new metastatic disease.  His PSA is a poor biomarker of his disease.  The time of diagnosis his PSA was 9.02.  His most recent PSA was 0.12 as of 08/16/2017.  In terms of urination, he feels that he has a good stream at this point in time.  He does have some mild intermittent dysuria.  He also has urinary frequency and nocturia q. every 2 hours.  No fevers or chills.  He is been self cathing to keep his bladder neck open for volumes less than 100 cc.  Has been doing this twice a day.  He is chronically colonized with bacteria and occasionally with  yeast.    PMH: Past Medical History:  Diagnosis Date  . Benign prostatic hypertrophy   . Bilateral carotid artery disease (HCC)    Mild plaque formation without obstructive disease noted on carotid Doppler.  . Coronary artery disease    Previous cardiac catheterization at [facility] in 2010. The patient was told about 2 blockages which did not require revascularization.  . Diabetes mellitus without complication ([place])   . Essential hypertension   . GERD (gastroesophageal reflux disease)   . Hyperlipidemia   . Hypertension   . Left carotid artery stenosis   . Prostate cancer ([place]) 03/2017    Surgical History: Past Surgical History:  Procedure Laterality Date  . CARDIAC CATHETERIZATION  2010  . CARDIAC CATHETERIZATION N/A 02/03/2015   Procedure: Left Heart Cath and Coronary Angiography;  Surgeon: Wellington [name], MD;  Location: [facility];  Service: Cardiovascular;  Laterality: N/A;  . CATARACT EXTRACTION    . CYSTOSCOPY W/ RETROGRADES Bilateral 04/11/2017   Procedure: CYSTOSCOPY WITH RETROGRADE PYELOGRAM;  Surgeon: [name], MD;  Location: [facility];  Service: Urology;  Laterality: Bilateral;  . CYSTOSCOPY WITH STENT PLACEMENT Left 04/11/2017   Procedure: CYSTOSCOPY WITH STENT PLACEMENT and fulgeration;  Surgeon: [name], MD;  Location: [facility];  Service: Urology;  Laterality: Left;  . CYSTOSCOPY WITH URETHRAL DILATATION Bilateral 04/11/2017   Procedure: CYSTOSCOPY WITH URETHRAL DILATATION;  Surgeon: [name], MD;  Location: [facility];  Service: Urology;  Laterality: Bilateral;  . IR CONVERT RIGHT NEPHROSTOMY TO NEPHROURETERAL CATH  05/21/2017  .  IR NEPHROSTOMY PLACEMENT RIGHT  04/13/2017  . PROSTATECTOMY      Home Medications:  Allergies as of 08/20/2017      Reactions   Metformin    Other reaction(s): Vomiting      Medication List        Accurate as of 08/20/17  7:39 PM. Always use your most recent med list.          acetaminophen 500 MG  tablet Commonly known as:  TYLENOL Take 500 mg by mouth every 4 (four) hours as needed for moderate pain or fever.   atorvastatin 10 MG tablet Commonly known as:  LIPITOR Take 10 mg by mouth daily.   CALCIUM 600+D3 600-800 MG-UNIT Tabs Generic drug:  Calcium Carb-Cholecalciferol Take 1 tablet by mouth daily.   feeding supplement (GLUCERNA SHAKE) Liqd Take 237 mLs by mouth 3 (three) times daily between meals.   glimepiride 4 MG tablet Commonly known as:  AMARYL   glucose blood test strip Commonly known as:  ACCU-CHEK GUIDE Use asdirected three times a day diag E11.65   ibuprofen 200 MG tablet Commonly known as:  ADVIL,MOTRIN Take 200 mg by mouth every 4 (four) hours as needed for fever or moderate pain.   insulin glargine 100 UNIT/ML injection Commonly known as:  LANTUS Inject 14-16 Units into the skin at bedtime. Based on sugar levels in am.   JARDIANCE 25 MG Tabs tablet Generic drug:  empagliflozin Take 25 mg by mouth daily.   lisinopril 2.5 MG tablet Commonly known as:  PRINIVIL,ZESTRIL Take 2.5 mg by mouth daily.   metoprolol succinate 25 MG 24 hr tablet Commonly known as:  TOPROL-XL Take 25 mg by mouth daily.   mirabegron ER 25 MG Tb24 tablet Commonly known as:  MYRBETRIQ Take 1 tablet (25 mg total) by mouth daily.   multivitamin with minerals tablet Take 1 tablet by mouth daily.   nitrofurantoin (macrocrystal-monohydrate) 100 MG capsule Commonly known as:  MACROBID Take 100 mg by mouth 2 (two) times daily.   nystatin powder Commonly known as:  MYCOSTATIN/NYSTOP APPLY TO AFFECTED AREA TWICE A DAY   nystatin-triamcinolone ointment Commonly known as:  MYCOLOG Apply 1 application topically 2 (two) times daily.   omeprazole 20 MG capsule Commonly known as:  PRILOSEC TAKE ONE CAPSULE BY MOUTH EVERY DAY   ondansetron 8 MG tablet Commonly known as:  ZOFRAN One pill every 8 hours as needed for nausea/vomitting.   prochlorperazine 10 MG tablet Commonly  known as:  COMPAZINE Take 1 tablet (10 mg total) by mouth every 6 (six) hours as needed for nausea or vomiting.   XTANDI 40 MG capsule Generic drug:  enzalutamide TAKE 4 CAPSULES (160 MG TOTAL) BY MOUTH DAILY.       Allergies:  Allergies  Allergen Reactions  . Metformin     Other reaction(s): Vomiting    Family History: Family History  Problem Relation Age of Onset  . Hypertension Father     Social History:  reports that he has never smoked. He has never used smokeless tobacco. He reports that he does not drink alcohol or use drugs.  ROS: UROLOGY Frequent Urination?: Yes Hard to postpone urination?: No Burning/pain with urination?: Yes Get up at night to urinate?: Yes Leakage of urine?: No Urine stream starts and stops?: No Trouble starting stream?: No Do you have to strain to urinate?: No Blood in urine?: No Urinary tract infection?: No Sexually transmitted disease?: No Injury to kidneys or bladder?: No Painful intercourse?: No Weak  stream?: No Erection problems?: No Penile pain?: No  Gastrointestinal Nausea?: No Vomiting?: No Indigestion/heartburn?: No Diarrhea?: No Constipation?: No  Constitutional Fever: No Night sweats?: No Weight loss?: No Fatigue?: No  Skin Skin rash/lesions?: No Itching?: No  Eyes Blurred vision?: No Double vision?: No  Ears/Nose/Throat Sore throat?: No Sinus problems?: No  Hematologic/Lymphatic Swollen glands?: No Easy bruising?: No  Cardiovascular Leg swelling?: No Chest pain?: No  Respiratory Cough?: No Shortness of breath?: No  Endocrine Excessive thirst?: No  Musculoskeletal Back pain?: No Joint pain?: No  Neurological Headaches?: No Dizziness?: No  Psychologic Depression?: No Anxiety?: No  Physical Exam: BP 137/84   Pulse (!) 102   Wt 124 lb (56.2 kg)   BMI 20.01 kg/m   Constitutional:  Alert and oriented, No acute distress.  Accompanied by daughter today.  Telephone Hindi translator  used today. HEENT: [place] AT, moist mucus membranes.  Trachea midline, no masses. Cardiovascular: No clubbing, cyanosis, or edema. Respiratory: Normal respiratory effort, no increased work of breathing. Skin: No rashes, bruises or suspicious lesions. Neurologic: Grossly intact, no focal deficits, moving all 4 extremities. Psychiatric: Normal mood and affect.  Laboratory Data: Lab Results  Component Value Date   WBC 7.7 08/16/2017   HGB 11.3 (L) 08/16/2017   HCT 33.4 (L) 08/16/2017   MCV 84.9 08/16/2017   PLT 368 08/16/2017    Lab Results  Component Value Date   CREATININE 1.09 08/16/2017    Lab Results  Component Value Date   HGBA1C 9.3 04/05/2017    Urinalysis UA + nit   Pertinent Imaging: CLINICAL DATA:  Followup metastatic prostate carcinoma. Undergoing chemotherapy.  EXAM: CT ABDOMEN AND PELVIS WITH CONTRAST  TECHNIQUE: Multidetector CT imaging of the abdomen and pelvis was performed using the standard protocol following bolus administration of intravenous contrast.  CONTRAST:  112mL ISOVUE-300 IOPAMIDOL (ISOVUE-300) INJECTION 61%  COMPARISON:  04/11/2017  FINDINGS: Lower Chest: No acute findings.  Hepatobiliary: No hepatic masses identified. Tiny calcified gallstones noted, without evidence of cholecystitis or biliary ductal dilatation.  Pancreas:  No mass or inflammatory changes.  Spleen: Within normal limits in size and appearance.  Adrenals/Urinary Tract: Bilateral ureteral stents are now seen in appropriate position and there has been significant decrease in bilateral hydronephrosis since prior exam. Stable tiny right renal cyst noted. Diffuse bladder wall thickening is seen, however previously seen focal soft tissue mass involving posterior wall and base is no longer visualized.  Stomach/Bowel: No evidence of obstruction, inflammatory process or abnormal fluid collections. Normal appendix visualized. Large amount of stool again seen  throughout the colon.  Vascular/Lymphatic: There has been near complete resolution of diffuse abdominal retroperitoneal lymphadenopathy,, with largest lymph node in the left paraaortic region currently measuring 11 mm on image 38/5 compared to 4.0 cm previously. Diffuse bilateral iliac lymphadenopathy has also nearly completely resolved, with index lymph node in the left external iliac chain on image 68/5 measuring 1.6 cm compared to 3.0 cm previously. Peri-prostatic and perirectal lymphadenopathy is also resolved.  Reproductive: Decreased size of prostate gland since previous study. Previously seen soft tissue masses involving the seminal vesicles and bladder base have resolved since previous exam.  Other:  None.  Musculoskeletal: Diffuse bone metastases show increased sclerosis since prior study, likely due to post treatment response. 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  IMPRESSION: Near complete resolution of metastatic abdominal and pelvic lymphadenopathy since prior study.  Near complete resolution of soft tissue masses involving the prostate and  bladder base since prior exam.  No new or progressive metastatic disease identified.  Increased sclerosis of diffuse bone metastases, likely due to interval response therapy.  Increased diffuse bladder wall thickening, consistent with cystitis.  Significant decrease in bilateral hydronephrosis following ureteral stent placement.   Electronically Signed   By: Earle Gell M.D.   On: 08/13/2017 13:01  CT scan personally reviewed today.  Assessment & Plan:    1. Prostate cancer [facility]) Currently on ADT and Xtandi managed by Dr. Rogue Bussing CT scan reviewed, excellent regression of local abdominal pelvic disease - Urinalysis, Complete - CULTURE, URINE COMPREHENSIVE  2. Bilateral hydronephrosis Management bilateral indwelling ureteral stents Given  excellent response to the above, recommend proceeding to the operating room for stent removal, bilateral retrograde pyelogram and possible ureteral stent removal depending on urinary drainage Both he and his daughter understand that if the kidneys are persistently hydronephrotic or fail to drain promptly, we will replace the stents They are agreeable this plan Urinary symptoms including frequency partially likely related to stent irritation  3. Bladder neck contracture Continue daily self cath acute bladder neck open May reduce from twice daily to once daily as long as catheter passes easily  4. Bacteriuria Chronic urinary bacteriuria Baseline urinary symptoms likely related to multiple factors including prostate cancer, stent irritation, amongst others No evidence of systemic infection We will send urine culture today and likely treat for several days prior to stent exchange to reduce bacterial load With the patient and his daughter are agreeable with this plan   [name], MD  [facility] [address] [place], [place] [address] [phone]

## 2017-08-21 ENCOUNTER — Other Ambulatory Visit: Payer: Self-pay | Admitting: Radiology

## 2017-08-21 LAB — MICROSCOPIC EXAMINATION
RBC, UA: >30 /hpf — ABNORMAL HIGH (ref 0–2)
WBC, UA: >30 /hpf — ABNORMAL HIGH (ref 0–5)

## 2017-08-21 LAB — URINALYSIS, COMPLETE
Bilirubin, UA: NEGATIVE
Ketones, UA: NEGATIVE
Nitrite, UA: POSITIVE — AB
Specific Gravity, UA: <1.005 — ABNORMAL LOW (ref 1.005–1.030)
Urobilinogen, Ur: 0.2 mg/dL (ref 0.2–1.0)
pH, UA: 5.5 (ref 5.0–7.5)

## 2017-08-23 LAB — CULTURE, URINE COMPREHENSIVE

## 2017-08-27 ENCOUNTER — Other Ambulatory Visit: Payer: Self-pay | Admitting: Radiology

## 2017-08-27 ENCOUNTER — Telehealth: Payer: Self-pay | Admitting: Radiology

## 2017-08-27 DIAGNOSIS — B3749 Other urogenital candidiasis: Secondary | ICD-10-CM

## 2017-08-27 MED ORDER — FLUCONAZOLE 100 MG PO TABS: 100.0000 mg | ORAL_TABLET | Freq: Every day | ORAL | 0 refills | Status: DC

## 2017-08-27 NOTE — Telephone Encounter (Signed)
Daughter called stating patient's temperature was as high as 100.1 over the weekend but is afebrile now. Still complains of burning with urination & frequency. States last dose of Macrobid will be today. Discussed urine culture collected 08/20/2017 with Dr Matilde Sprang. Orders received for Diflucan 100mg  x 5 days. Per Dr Matilde Sprang, explained that since urine culture showed multiple species it is likely skin contamination rather than a urinary tract infection. He would like to repeat urine culture on 08/31/2017 in order to choose antibiotic to be used during surgery. Also notified daughter of script sent to pharmacy. Questions answered. Daughter voices understanding.

## 2017-08-31 ENCOUNTER — Ambulatory Visit (INDEPENDENT_AMBULATORY_CARE_PROVIDER_SITE_OTHER): Payer: Medicare Other | Admitting: Family Medicine

## 2017-08-31 VITALS — BP 132/82 | HR 76 | Wt 124.0 lb

## 2017-08-31 DIAGNOSIS — R8271 Bacteriuria: Secondary | ICD-10-CM | POA: Diagnosis not present

## 2017-08-31 DIAGNOSIS — C61 Malignant neoplasm of prostate: Secondary | ICD-10-CM | POA: Diagnosis not present

## 2017-08-31 DIAGNOSIS — N320 Bladder-neck obstruction: Secondary | ICD-10-CM | POA: Diagnosis not present

## 2017-08-31 DIAGNOSIS — Z01818 Encounter for other preprocedural examination: Secondary | ICD-10-CM | POA: Diagnosis not present

## 2017-08-31 DIAGNOSIS — N1330 Unspecified hydronephrosis: Secondary | ICD-10-CM | POA: Diagnosis not present

## 2017-08-31 LAB — URINALYSIS, COMPLETE
Bilirubin, UA: NEGATIVE
Ketones, UA: NEGATIVE
Nitrite, UA: POSITIVE — AB
Specific Gravity, UA: 1.01 (ref 1.005–1.030)
Urobilinogen, Ur: 0.2 mg/dL (ref 0.2–1.0)
pH, UA: 6 (ref 5.0–7.5)

## 2017-08-31 LAB — MICROSCOPIC EXAMINATION
Epithelial Cells (non renal): NONE SEEN /hpf (ref 0–10)
RBC, UA: >30 /hpf — ABNORMAL HIGH (ref 0–2)
WBC, UA: >30 /hpf — ABNORMAL HIGH (ref 0–5)

## 2017-08-31 NOTE — Progress Notes (Signed)
In and Out Catheterization  Patient is present today for a I & O catheterization due to suregery. Patient was cleaned and prepped in a sterile fashion with betadine and Lidocaine 2% jelly was instilled into the urethra.  A 14FR cath was inserted no complications were noted , 23ml of urine return was noted, urine was yellow in color. A clean urine sample was collected for UA, UCX. Bladder was drained  And catheter was removed with out difficulty.    Preformed by: Elberta Leatherwood, CMA

## 2017-09-04 ENCOUNTER — Other Ambulatory Visit: Payer: Self-pay

## 2017-09-04 ENCOUNTER — Encounter
Admission: RE | Admit: 2017-09-04 | Discharge: 2017-09-04 | Disposition: A | Discharge status: Home / Self Care | Payer: Medicare Other | Source: Ambulatory Visit | Attending: Urology | Admitting: Urology

## 2017-09-04 NOTE — Patient Instructions (Signed)
Your procedure is scheduled on: 09/12/17 Wed Report to Same Day Surgery 2nd floor medical mall [facility] Entrance-take elevator on left to 2nd floor.  Check in with surgery information desk.) To find out your arrival time please call [phone] between 1PM - 3PM on 09/11/17 Tues  Remember: Instructions that are not followed completely may result in serious medical risk, up to and including death, or upon the discretion of your surgeon and anesthesiologist your surgery may need to be rescheduled.    _x___ 1. Do not eat food after midnight the night before your procedure. You may drink clear liquids up to 2 hours before you are scheduled to arrive at the hospital for your procedure.  Do not drink clear liquids within 2 hours of your scheduled arrival to the hospital.  Clear liquids include  --Water or Apple juice without pulp  --Clear carbohydrate beverage such as ClearFast or Gatorade  --Black Coffee or Clear Tea (No milk, no creamers, do not add anything to                  the coffee or Tea Type 1 and type 2 diabetics should only drink water.  No gum chewing or hard candies.     __x__ 2. No Alcohol for 24 hours before or after surgery.   __x__3. No Smoking or e-cigarettes for 24 prior to surgery.  Do not use any chewable tobacco products for at least 6 hour prior to surgery   ____  4. Bring all medications with you on the day of surgery if instructed.    __x__ 5. Notify your doctor if there is any change in your medical condition     (cold, fever, infections).    x___6. On the morning of surgery brush your teeth with toothpaste and water.  You may rinse your mouth with mouth wash if you wish.  Do not swallow any toothpaste or mouthwash.   Do not wear jewelry, make-up, hairpins, clips or nail polish.  Do not wear lotions, powders, or perfumes. You may wear deodorant.  Do not shave 48 hours prior to surgery. Men may shave face and neck.  Do not bring valuables to the hospital.     [facility] is not responsible for any belongings or valuables.               Contacts, dentures or bridgework may not be worn into surgery.  Leave your suitcase in the car. After surgery it may be brought to your room.  For patients admitted to the hospital, discharge time is determined by your                       treatment team.  _  Patients discharged the day of surgery will not be allowed to drive home.  You will need someone to drive you home and stay with you the night of your procedure.    Please read over the following fact sheets that you were given:   [facility] Preparing for Surgery and or MRSA Information   _x___ Take anti-hypertensive listed below, cardiac, seizure, asthma,     anti-reflux and psychiatric medicines. These include:  1. albuterol (PROVENTIL HFA;VENTOLIN HFA use and bring to hospital  2.metoprolol succinate   3.omeprazole (PRILOSEC) 20 MG capsule  4.  5.  6.  ____Fleets enema or Magnesium Citrate as directed.   _x___ Use CHG Soap or sage wipes as directed on instruction sheet   ____ Use  inhalers on the day of surgery and bring to hospital day of surgery  ____ Stop Metformin and Janumet 2 days prior to surgery.    _x___ Take 1/2 of usual insulin dose the night before surgery and none on the morning     surgery.   _x___ Follow recommendations from Cardiologist, Pulmonologist or PCP regarding          stopping Aspirin, Coumadin, Plavix ,Eliquis, Effient, or Pradaxa, and Pletal.  X____Stop Anti-inflammatories such as Advil, Aleve, Ibuprofen, Motrin, Naproxen, Naprosyn, Goodies powders or aspirin products. OK to take Tylenol and                          Celebrex.   _x___ Stop supplements until after surgery.  But may continue Vitamin D, Vitamin B,       and multivitamin.   ____ Bring C-Pap to the hospital.

## 2017-09-05 DIAGNOSIS — E119 Type 2 diabetes mellitus without complications: Secondary | ICD-10-CM | POA: Diagnosis not present

## 2017-09-05 DIAGNOSIS — Z960 Presence of urogenital implants: Secondary | ICD-10-CM | POA: Diagnosis not present

## 2017-09-05 DIAGNOSIS — Z794 Long term (current) use of insulin: Secondary | ICD-10-CM | POA: Diagnosis not present

## 2017-09-05 DIAGNOSIS — Z7689 Persons encountering health services in other specified circumstances: Secondary | ICD-10-CM | POA: Diagnosis not present

## 2017-09-05 DIAGNOSIS — I10 Essential (primary) hypertension: Secondary | ICD-10-CM | POA: Diagnosis not present

## 2017-09-05 DIAGNOSIS — C61 Malignant neoplasm of prostate: Secondary | ICD-10-CM | POA: Diagnosis not present

## 2017-09-05 DIAGNOSIS — E7800 Pure hypercholesterolemia, unspecified: Secondary | ICD-10-CM | POA: Diagnosis not present

## 2017-09-05 LAB — CULTURE, URINE COMPREHENSIVE

## 2017-09-06 ENCOUNTER — Telehealth: Payer: Self-pay | Admitting: Radiology

## 2017-09-06 ENCOUNTER — Other Ambulatory Visit: Payer: Self-pay | Admitting: Radiology

## 2017-09-06 DIAGNOSIS — C61 Malignant neoplasm of prostate: Secondary | ICD-10-CM

## 2017-09-06 DIAGNOSIS — R8271 Bacteriuria: Secondary | ICD-10-CM

## 2017-09-06 MED ORDER — AMOXICILLIN-POT CLAVULANATE 875-125 MG PO TABS: 1.0000 | ORAL_TABLET | Freq: Two times a day (BID) | ORAL | 0 refills | Status: DC

## 2017-09-06 NOTE — Telephone Encounter (Signed)
-----   Message from [name], MD sent at 09/06/2017  9:29 AM EDT ----- This patient ended up growing 2 bacteria in his preop urine culture, Pseudomonas and E. coli.  Unfortunately, these are fairly resistant.  We can treat the E. coli with Augmentin twice daily to be started about 5 days prior to the procedure for a total of 7 days.  Unfortunately, the Pseudomonas is resistant to everything oral.  As such, we will just give him imipenem intraoperatively and given that this is a relatively low risk procedure, hope that will be sufficient.    [name], MD

## 2017-09-06 NOTE — Telephone Encounter (Signed)
Notified daughter of script sent to pharmacy. Instructions given. Daughter voices understanding.

## 2017-09-10 DIAGNOSIS — C61 Malignant neoplasm of prostate: Secondary | ICD-10-CM | POA: Diagnosis not present

## 2017-09-10 DIAGNOSIS — E7800 Pure hypercholesterolemia, unspecified: Secondary | ICD-10-CM | POA: Diagnosis not present

## 2017-09-10 DIAGNOSIS — R630 Anorexia: Secondary | ICD-10-CM | POA: Diagnosis not present

## 2017-09-10 DIAGNOSIS — D649 Anemia, unspecified: Secondary | ICD-10-CM | POA: Diagnosis not present

## 2017-09-10 DIAGNOSIS — E8809 Other disorders of plasma-protein metabolism, not elsewhere classified: Secondary | ICD-10-CM | POA: Diagnosis not present

## 2017-09-10 DIAGNOSIS — I10 Essential (primary) hypertension: Secondary | ICD-10-CM | POA: Diagnosis not present

## 2017-09-10 DIAGNOSIS — E1165 Type 2 diabetes mellitus with hyperglycemia: Secondary | ICD-10-CM | POA: Diagnosis not present

## 2017-09-12 ENCOUNTER — Ambulatory Visit
Admission: RE | Admit: 2017-09-12 | Discharge: 2017-09-12 | Disposition: A | Discharge status: Home / Self Care | Payer: Medicare Other | Source: Ambulatory Visit | Attending: Urology | Admitting: Urology

## 2017-09-12 ENCOUNTER — Encounter
Admission: RE | Disposition: A | Discharge status: Home / Self Care | Payer: Self-pay | Source: Ambulatory Visit | Attending: Urology

## 2017-09-12 ENCOUNTER — Other Ambulatory Visit: Payer: Self-pay

## 2017-09-12 ENCOUNTER — Ambulatory Visit: Payer: Medicare Other | Admitting: Anesthesiology

## 2017-09-12 DIAGNOSIS — I6523 Occlusion and stenosis of bilateral carotid arteries: Secondary | ICD-10-CM | POA: Diagnosis not present

## 2017-09-12 DIAGNOSIS — I10 Essential (primary) hypertension: Secondary | ICD-10-CM | POA: Diagnosis not present

## 2017-09-12 DIAGNOSIS — E785 Hyperlipidemia, unspecified: Secondary | ICD-10-CM | POA: Diagnosis not present

## 2017-09-12 DIAGNOSIS — E1151 Type 2 diabetes mellitus with diabetic peripheral angiopathy without gangrene: Secondary | ICD-10-CM | POA: Diagnosis not present

## 2017-09-12 DIAGNOSIS — N320 Bladder-neck obstruction: Secondary | ICD-10-CM | POA: Diagnosis not present

## 2017-09-12 DIAGNOSIS — E119 Type 2 diabetes mellitus without complications: Secondary | ICD-10-CM | POA: Insufficient documentation

## 2017-09-12 DIAGNOSIS — I2510 Atherosclerotic heart disease of native coronary artery without angina pectoris: Secondary | ICD-10-CM | POA: Diagnosis not present

## 2017-09-12 DIAGNOSIS — N1330 Unspecified hydronephrosis: Secondary | ICD-10-CM | POA: Diagnosis not present

## 2017-09-12 DIAGNOSIS — Z9079 Acquired absence of other genital organ(s): Secondary | ICD-10-CM | POA: Insufficient documentation

## 2017-09-12 DIAGNOSIS — C61 Malignant neoplasm of prostate: Secondary | ICD-10-CM | POA: Diagnosis not present

## 2017-09-12 DIAGNOSIS — Z79899 Other long term (current) drug therapy: Secondary | ICD-10-CM | POA: Insufficient documentation

## 2017-09-12 DIAGNOSIS — I25119 Atherosclerotic heart disease of native coronary artery with unspecified angina pectoris: Secondary | ICD-10-CM | POA: Diagnosis not present

## 2017-09-12 DIAGNOSIS — Z8249 Family history of ischemic heart disease and other diseases of the circulatory system: Secondary | ICD-10-CM | POA: Diagnosis not present

## 2017-09-12 DIAGNOSIS — R8271 Bacteriuria: Secondary | ICD-10-CM | POA: Diagnosis not present

## 2017-09-12 DIAGNOSIS — E7800 Pure hypercholesterolemia, unspecified: Secondary | ICD-10-CM | POA: Diagnosis not present

## 2017-09-12 DIAGNOSIS — Z794 Long term (current) use of insulin: Secondary | ICD-10-CM | POA: Insufficient documentation

## 2017-09-12 DIAGNOSIS — C7951 Secondary malignant neoplasm of bone: Secondary | ICD-10-CM | POA: Diagnosis not present

## 2017-09-12 DIAGNOSIS — Z888 Allergy status to other drugs, medicaments and biological substances status: Secondary | ICD-10-CM | POA: Diagnosis not present

## 2017-09-12 HISTORY — PX: CYSTOSCOPY W/ RETROGRADES: SHX1426

## 2017-09-12 HISTORY — PX: CYSTOSCOPY W/ URETERAL STENT PLACEMENT: SHX1429

## 2017-09-12 LAB — GLUCOSE, CAPILLARY
Glucose-Capillary: 158 mg/dL — ABNORMAL HIGH (ref 70–99)
Glucose-Capillary: 137 mg/dL — ABNORMAL HIGH (ref 70–99)

## 2017-09-12 SURGERY — CYSTOSCOPY, WITH RETROGRADE PYELOGRAM
Anesthesia: General | Site: Ureter | Laterality: Bilateral | Wound class: Clean Contaminated

## 2017-09-12 MED ORDER — PHENYLEPHRINE HCL 10 MG/ML IJ SOLN
INTRAMUSCULAR | Status: DC | PRN
  Administered 2017-09-12: 100 ug via INTRAVENOUS
  Administered 2017-09-12: 50 ug via INTRAVENOUS
  Administered 2017-09-12: 100 ug via INTRAVENOUS
  Administered 2017-09-12: 50 ug via INTRAVENOUS
  Administered 2017-09-12: 100 ug via INTRAVENOUS
  Administered 2017-09-12: 50 ug via INTRAVENOUS

## 2017-09-12 MED ORDER — PROMETHAZINE HCL 25 MG/ML IJ SOLN: 6.2500 mg | INTRAMUSCULAR | Status: DC | PRN

## 2017-09-12 MED ORDER — FAMOTIDINE 20 MG PO TABS
ORAL_TABLET | ORAL | Status: AC
  Filled 2017-09-12: qty 1

## 2017-09-12 MED ORDER — PROPOFOL 10 MG/ML IV BOLUS
INTRAVENOUS | Status: DC | PRN
  Administered 2017-09-12: 60 mg via INTRAVENOUS

## 2017-09-12 MED ORDER — FENTANYL CITRATE (PF) 100 MCG/2ML IJ SOLN
INTRAMUSCULAR | Status: AC
  Filled 2017-09-12: qty 2

## 2017-09-12 MED ORDER — PROPOFOL 10 MG/ML IV BOLUS
INTRAVENOUS | Status: AC
  Filled 2017-09-12: qty 20

## 2017-09-12 MED ORDER — ONDANSETRON HCL 4 MG/2ML IJ SOLN
INTRAMUSCULAR | Status: DC | PRN
  Administered 2017-09-12: 4 mg via INTRAVENOUS

## 2017-09-12 MED ORDER — SODIUM CHLORIDE 0.9 % IV SOLN
500.0000 mg | Freq: Once | INTRAVENOUS | Status: AC
  Administered 2017-09-12: 500 mg via INTRAVENOUS
  Filled 2017-09-12: qty 500

## 2017-09-12 MED ORDER — HYDROMORPHONE HCL 1 MG/ML IJ SOLN: 0.2500 mg | INTRAMUSCULAR | Status: DC | PRN

## 2017-09-12 MED ORDER — ACETAMINOPHEN 325 MG PO TABS: 325.0000 mg | ORAL_TABLET | ORAL | Status: DC | PRN

## 2017-09-12 MED ORDER — IOTHALAMATE MEGLUMINE 43 % IV SOLN
INTRAVENOUS | Status: DC | PRN
  Administered 2017-09-12: 30 mL via URETHRAL

## 2017-09-12 MED ORDER — HYDROCODONE-ACETAMINOPHEN 7.5-325 MG PO TABS: 1.0000 | ORAL_TABLET | Freq: Once | ORAL | Status: DC | PRN

## 2017-09-12 MED ORDER — MIDAZOLAM HCL 2 MG/2ML IJ SOLN
INTRAMUSCULAR | Status: AC
  Filled 2017-09-12: qty 2

## 2017-09-12 MED ORDER — FAMOTIDINE 20 MG PO TABS
20.0000 mg | ORAL_TABLET | Freq: Once | ORAL | Status: AC
  Administered 2017-09-12: 20 mg via ORAL

## 2017-09-12 MED ORDER — FENTANYL CITRATE (PF) 100 MCG/2ML IJ SOLN
INTRAMUSCULAR | Status: DC | PRN
  Administered 2017-09-12 (×4): 25 ug via INTRAVENOUS

## 2017-09-12 MED ORDER — ACETAMINOPHEN 160 MG/5ML PO SOLN
325.0000 mg | ORAL | Status: DC | PRN
  Filled 2017-09-12: qty 20.3

## 2017-09-12 MED ORDER — LIDOCAINE HCL (CARDIAC) PF 100 MG/5ML IV SOSY
PREFILLED_SYRINGE | INTRAVENOUS | Status: DC | PRN
  Administered 2017-09-12: 40 mg via INTRAVENOUS

## 2017-09-12 MED ORDER — SODIUM CHLORIDE 0.9 % IV SOLN
INTRAVENOUS | Status: DC
  Administered 2017-09-12: 11:00:00 via INTRAVENOUS

## 2017-09-12 MED ORDER — ONDANSETRON HCL 4 MG/2ML IJ SOLN
INTRAMUSCULAR | Status: AC
  Filled 2017-09-12: qty 2

## 2017-09-12 MED ORDER — MEPERIDINE HCL 50 MG/ML IJ SOLN: 6.2500 mg | INTRAMUSCULAR | Status: DC | PRN

## 2017-09-12 MED ORDER — LIDOCAINE HCL (PF) 2 % IJ SOLN
INTRAMUSCULAR | Status: AC
  Filled 2017-09-12: qty 10

## 2017-09-12 SURGICAL SUPPLY — 19 items
BAG DRAIN CYSTO-URO LG1000N (MISCELLANEOUS) ×3 IMPLANT
BRUSH SCRUB EZ  4% CHG (MISCELLANEOUS) ×3
BRUSH SCRUB EZ 4% CHG (MISCELLANEOUS) ×2 IMPLANT
CATH URETL 5X70 OPEN END (CATHETERS) ×3 IMPLANT
CONRAY 43 FOR UROLOGY 50M (MISCELLANEOUS) ×3 IMPLANT
DRAPE UTILITY 15X26 TOWEL STRL (DRAPES) ×3 IMPLANT
GLOVE BIO SURGEON STRL SZ 6.5 (GLOVE) ×3 IMPLANT
GOWN STRL REUS W/ TWL LRG LVL3 (GOWN DISPOSABLE) ×4 IMPLANT
GOWN STRL REUS W/TWL LRG LVL3 (GOWN DISPOSABLE) ×6
KIT TURNOVER CYSTO (KITS) ×3 IMPLANT
PACK CYSTO AR (MISCELLANEOUS) ×3 IMPLANT
SENSORWIRE 0.038 NOT ANGLED (WIRE) ×3
SET CYSTO W/LG BORE CLAMP LF (SET/KITS/TRAYS/PACK) ×3 IMPLANT
SOL .9 NS 3000ML IRR  AL (IV SOLUTION) ×3
SOL .9 NS 3000ML IRR UROMATIC (IV SOLUTION) ×2 IMPLANT
STENT URO INLAY 6FRX24CM (STENTS) ×6 IMPLANT
SURGILUBE 2OZ TUBE FLIPTOP (MISCELLANEOUS) ×3 IMPLANT
WATER STERILE IRR 1000ML POUR (IV SOLUTION) ×3 IMPLANT
WIRE SENSOR 0.038 NOT ANGLED (WIRE) ×2 IMPLANT

## 2017-09-12 NOTE — Anesthesia Procedure Notes (Signed)
Procedure Name: LMA Insertion Date/Time: 09/12/2017 11:13 AM Performed by: Rudean Hitt, CRNA Pre-anesthesia Checklist: Patient identified, Patient being monitored, Timeout performed, Emergency Drugs available and Suction available Patient Re-evaluated:Patient Re-evaluated prior to induction Oxygen Delivery Method: Circle system utilized Preoxygenation: Pre-oxygenation with 100% oxygen Induction Type: IV induction Ventilation: Mask ventilation without difficulty LMA: LMA inserted Tube type: Oral Number of attempts: 1 Placement Confirmation: positive ETCO2 and breath sounds checked- equal and bilateral Tube secured with: Tape Dental Injury: Teeth and Oropharynx as per pre-operative assessment

## 2017-09-12 NOTE — Discharge Instructions (Signed)
You have a ureteral stent in place.  This is a tube that extends from your kidney to your bladder.  This may cause urinary bleeding, burning with urination, and urinary frequency.  Please call our office or present to the ED if you develop fevers >101 or pain which is not able to be controlled with oral pain medications.  You may be given either Flomax and/ or ditropan to help with bladder spasms and stent pain in addition to pain medications.    [facility] [address], [address] [place], [place] [address] [phone] 

## 2017-09-12 NOTE — Anesthesia Postprocedure Evaluation (Signed)
Anesthesia Post Note  Patient: Michael [name]  Procedure(s) Performed: CYSTOSCOPY WITH RETROGRADE PYELOGRAM (Bilateral Ureter) CYSTOSCOPY WITH STENT REPLACEMENT (Bilateral Ureter)  Patient location during evaluation: PACU Anesthesia Type: General Level of consciousness: awake and alert Pain management: pain level controlled Vital Signs Assessment: post-procedure vital signs reviewed and stable Respiratory status: spontaneous breathing, nonlabored ventilation and respiratory function stable Cardiovascular status: blood pressure returned to baseline and stable Postop Assessment: no apparent nausea or vomiting Anesthetic complications: no     Last Vitals:  Vitals:   09/12/17 1245 09/12/17 1306  BP: 129/81 (!) 130/93  Pulse: 80 82  Resp:    Temp: (!) 36.4 C   SpO2: 100% 100%    Last Pain:  Vitals:   09/12/17 1306  TempSrc:   PainSc: 0-No pain                 Alphonsus Sias

## 2017-09-12 NOTE — Anesthesia Post-op Follow-up Note (Signed)
Anesthesia QCDR form completed.        

## 2017-09-12 NOTE — Op Note (Signed)
Date of procedure: 09/12/17  Preoperative diagnosis:  1. Metastatic prostate cancer 2. Bilateral hydronephrosis 3. History of bladder neck contracture  Postoperative diagnosis:        Same as above  Procedure: 1. Cystoscopy 2. Bilateral ureteral stent exchange 3. Bilateral retrograde pyelogram   Surgeon: [name], MD  Anesthesia: General  Complications: None  Intraoperative findings: Irregular prostatic fossa with scar tissue bladder neck appreciated.  Bilateral stents with a Morphis matrix likes material adherent  EBL: Minimal  Specimens: None  Drains: 6 x 24 French Bard Optima ureteral stents bilaterally  Indication: Michael [name] is a 84 y.o. patient with metastatic prostate cancer now responding well to therapy.  He previously had indwelling ureteral stents for lateral ureteral obstruction return to the operating room today to assess whether or not these need to be exchanged or removed.  After reviewing the management options for treatment, he elected to proceed with the above surgical procedure(s). We have discussed the potential benefits and risks of the procedure, side effects of the proposed treatment, the likelihood of the patient achieving the goals of the procedure, and any potential problems that might occur during the procedure or recuperation. Informed consent has been obtained.  Description of procedure:  The patient was taken to the operating room and general anesthesia was induced.  The patient was placed in the dorsal lithotomy position, prepped and draped in the usual sterile fashion, and preoperative antibiotics were administered. A preoperative time-out was performed.   A 21 French scope was advanced per urethra into the bladder.  Of note, the prostatic fossa was notably irregular with friable scarlike appearance of the bladder neck which was somewhat fixed and narrowed but was able to accommodate the 21 [place] scope.  The bladder itself had an  irregular bladder neck but improved appearance since the time of his last cystoscopy.  Cloudy urine was drained from his bladder and the bladder was irrigated several times to improve visualization.  There are no other significant lesions within the bladder.    Attention was first turned to the left ureteral orifice from which a ureteral stent was seen emanating.  The distal coil of the stent was grasped and brought to level the urethral meatus.  The stent was then easily cannulated with a wire up to level the of the kidney.  This is left in place as a safety wire.  Next, the scope was reintroduced and a open-ended 5 [place] ureteral catheter was placed just within the UO and a gentle retrograde pyelogram was performed.  This revealed decompressed mid and distal ureter but there was a level obstruction within the proximal ureter and hydroureteronephrosis down to this level.  I then advanced a 5 [place] open-ended ureteral catheter over a second wire up to level the proximal ureter and injected additional contrast solution.  This showed no filling defects but there was minimal to no drainage of this contrast material to the level.  This is concerning for ongoing extrinsic compression.  As such, the safety wire was backloaded over the rigid cystoscope and a 6 x 24 French Bard Optima ureteral stent was then readvanced over the wire up to level the kidney.  The wires partially drawn until focal is noted within the renal pelvis and the bladder.  Next, the same exact procedure was performed in the right.  The distal coil of the stent was grasped brought to level urethral meatus.  Was cannulated using a sensor wire up to level the kidney.  The scope was then reintroduced and a 5 [place] open-ended ureteral catheter was inserted alongside of the wire and retrograde pyelogram was performed.  This revealed no hydroureteronephrosis and no obvious filling defects.  Contrast did fill the upper tract collecting system.  I waited  at least 5 minutes, but there is still retained material within the kidney but prompt drainage from the ureter.  Given the somewhat sluggish drainage on the side as well, I opted to replace the stent as well.  The safety wire was backloaded over rigid cystoscope.  A 6 x 24 French double-J ureteral stent was advanced over the wire up to level the kidney.  The wire was partially drawn until full coils noted both within the renal pelvis and the bladder.  The bladder was then drained.  The patient was then clean and dry, repositioned the supine position, reversed from anesthesia, and taken the PACU in stable condition.  Plan: Findings were discussed with the patient's daughter.  I explained my rationale for placing the stents.  We will see him again in 5 months to arrange for stent exchange.  He should continue to self cath daily and may slowly decrease the frequency of self cath as long as this is easy for him.  I have encouraged him to take Myrbetriq for urinary urgency frequency which he is not currently compliant with to help with his urinary symptoms.  [name], M.D.

## 2017-09-12 NOTE — Progress Notes (Signed)
Pharmacy Antibiotic Note  CLOUD GRAHAM is a 84 y.o. male admitted on 09/12/2017 with surgical prophylaxis. Patient has a h/o ESBL E. Coli Pharmacy has been consulted for Imilpenem-Cilastatin dosing.  Plan: Will give one dose of imipenem-cilastatin 500 mg IV x 1 for surgical prophylaxis  Weight: 118 lb (53.5 kg)  Temp (24hrs), Avg:97.3 F (36.3 C), Min:97.3 F (36.3 C), Max:97.3 F (36.3 C)  No results for input(s): WBC, CREATININE, LATICACIDVEN, VANCOTROUGH, VANCOPEAK, VANCORANDOM, GENTTROUGH, GENTPEAK, GENTRANDOM, TOBRATROUGH, TOBRAPEAK, TOBRARND, AMIKACINPEAK, AMIKACINTROU, AMIKACIN in the last 168 hours.  CrCl cannot be calculated (Patient's most recent lab result is older than the maximum 21 days allowed.).    Allergies  Allergen Reactions  . Metformin Nausea And Vomiting    Thank you for allowing pharmacy to be a part of this patient's care.  Tobie Lords, PharmD, BCPS Clinical Pharmacist 09/12/2017

## 2017-09-12 NOTE — Progress Notes (Signed)
Voided x1    Bloody urine noted

## 2017-09-12 NOTE — Transfer of Care (Signed)
Immediate Anesthesia Transfer of Care Note  Patient: Michael [name]  Procedure(s) Performed: CYSTOSCOPY WITH RETROGRADE PYELOGRAM (Bilateral Ureter) CYSTOSCOPY WITH STENT REPLACEMENT (Bilateral Ureter)  Patient Location: PACU  Anesthesia Type:General  Level of Consciousness: sedated  Airway & Oxygen Therapy: Patient Spontanous Breathing and Patient connected to face mask oxygen  Post-op Assessment: Report given to RN and Post -op Vital signs reviewed and stable  Post vital signs: Reviewed and stable  Last Vitals:  Vitals Value Taken Time  BP 125/72 09/12/2017 11:52 AM  Temp 36.4 C 09/12/2017 11:52 AM  Pulse 78 09/12/2017 11:53 AM  Resp 14 09/12/2017 11:53 AM  SpO2 100 % 09/12/2017 11:53 AM  Vitals shown include unvalidated device data.  Last Pain:  Vitals:   09/12/17 1152  TempSrc:   PainSc: Asleep         Complications: No apparent anesthesia complications

## 2017-09-12 NOTE — Interval H&P Note (Signed)
History and Physical Interval Note:  09/12/2017 10:49 AM  Michael [name]  has presented today for surgery, with the diagnosis of prostate cancer  The various methods of treatment have been discussed with the patient and family. After consideration of risks, benefits and other options for treatment, the patient has consented to  Procedure(s): CYSTOSCOPY WITH RETROGRADE PYELOGRAM (Bilateral) CYSTOSCOPY WITH STENT REMOVAL (Bilateral) as a surgical intervention .  The patient's history has been reviewed, patient examined, no change in status, stable for surgery.  I have reviewed the patient's chart and labs.  Questions were answered to the patient's satisfaction.    RRR CTAB  + preop UCx, treated but due to MDR, will be getting imipenem in OR today to reduce risk of pyelonephritis/ systemic infection   [name]

## 2017-09-12 NOTE — Anesthesia Preprocedure Evaluation (Signed)
Anesthesia Evaluation  Patient identified by MRN, date of birth, ID band Patient awake    Reviewed: Allergy & Precautions, H&P , NPO status , reviewed documented beta blocker date and time   Airway Mallampati: II  TM Distance: >3 FB Neck ROM: full    Dental  (+) Upper Dentures, Lower Dentures   Pulmonary           Cardiovascular hypertension, + angina + CAD and + Peripheral Vascular Disease       Neuro/Psych    GI/Hepatic GERD  ,  Endo/Other  diabetes  Renal/GU Renal disease     Musculoskeletal   Abdominal   Peds  Hematology  (+) anemia ,   Anesthesia Other Findings LMA 4 for cysto 03/2017  Past Medical History: No date: Benign prostatic hypertrophy No date: Bilateral carotid artery disease (HCC)     Comment:  Mild plaque formation without obstructive disease noted               on carotid Doppler. No date: Coronary artery disease     Comment:  Previous cardiac catheterization at [facility] in 2010. The               patient was told about 2 blockages which did not require               revascularization. No date: Diabetes mellitus without complication (HCC) No date: Essential hypertension No date: GERD (gastroesophageal reflux disease) No date: Hyperlipidemia No date: Hypertension No date: Left carotid artery stenosis 03/2017: Prostate cancer [facility]) Past Surgical History: 2010: CARDIAC CATHETERIZATION 02/03/2015: CARDIAC CATHETERIZATION; N/A     Comment:  Procedure: Left Heart Cath and Coronary Angiography;                Surgeon: Wellington [name], MD;  Location: [place] CV               LAB;  Service: Cardiovascular;  Laterality: N/A; No date: CATARACT EXTRACTION 04/11/2017: CYSTOSCOPY W/ RETROGRADES; Bilateral     Comment:  Procedure: CYSTOSCOPY WITH RETROGRADE PYELOGRAM;                Surgeon: [name], MD;  Location: [facility] ORS;                Service: Urology;  Laterality: Bilateral; 04/11/2017:  CYSTOSCOPY WITH STENT PLACEMENT; Left     Comment:  Procedure: CYSTOSCOPY WITH STENT PLACEMENT and               fulgeration;  Surgeon: [name], MD;  Location:               [facility] ORS;  Service: Urology;  Laterality: Left; 04/11/2017: CYSTOSCOPY WITH URETHRAL DILATATION; Bilateral     Comment:  Procedure: CYSTOSCOPY WITH URETHRAL DILATATION;                Surgeon: [name], MD;  Location: [facility] ORS;                Service: Urology;  Laterality: Bilateral; 05/21/2017: IR CONVERT RIGHT NEPHROSTOMY TO NEPHROURETERAL CATH 04/13/2017: IR NEPHROSTOMY PLACEMENT RIGHT No date: PROSTATECTOMY   Reproductive/Obstetrics                             Anesthesia Physical Anesthesia Plan  ASA: III  Anesthesia Plan: General LMA   Post-op Pain Management:    Induction:   PONV Risk Score and Plan: 2 and Ondansetron, Treatment  may vary due to age or medical condition, Midazolam, Metaclopromide and Dexamethasone  Airway Management Planned:   Additional Equipment:   Intra-op Plan:   Post-operative Plan:   Informed Consent: I have reviewed the patients History and Physical, chart, labs and discussed the procedure including the risks, benefits and alternatives for the proposed anesthesia with the patient or authorized representative who has indicated his/her understanding and acceptance.   Dental Advisory Given  Plan Discussed with: CRNA  Anesthesia Plan Comments:         Anesthesia Quick Evaluation

## 2017-09-13 ENCOUNTER — Other Ambulatory Visit: Payer: Self-pay | Admitting: Internal Medicine

## 2017-09-14 ENCOUNTER — Inpatient Hospital Stay: Payer: Medicare Other

## 2017-09-14 ENCOUNTER — Inpatient Hospital Stay (HOSPITAL_BASED_OUTPATIENT_CLINIC_OR_DEPARTMENT_OTHER): Payer: Medicare Other | Admitting: Internal Medicine

## 2017-09-14 ENCOUNTER — Inpatient Hospital Stay: Payer: Medicare Other | Attending: Internal Medicine

## 2017-09-14 ENCOUNTER — Other Ambulatory Visit: Payer: Self-pay

## 2017-09-14 VITALS — BP 123/76 | HR 99 | Temp 97.8°F | Resp 16 | Ht 68.0 in | Wt 121.0 lb

## 2017-09-14 DIAGNOSIS — I2510 Atherosclerotic heart disease of native coronary artery without angina pectoris: Secondary | ICD-10-CM | POA: Insufficient documentation

## 2017-09-14 DIAGNOSIS — Z794 Long term (current) use of insulin: Secondary | ICD-10-CM | POA: Diagnosis not present

## 2017-09-14 DIAGNOSIS — Z79818 Long term (current) use of other agents affecting estrogen receptors and estrogen levels: Secondary | ICD-10-CM | POA: Insufficient documentation

## 2017-09-14 DIAGNOSIS — Z79899 Other long term (current) drug therapy: Secondary | ICD-10-CM | POA: Diagnosis not present

## 2017-09-14 DIAGNOSIS — Z87440 Personal history of urinary (tract) infections: Secondary | ICD-10-CM | POA: Diagnosis not present

## 2017-09-14 DIAGNOSIS — E1165 Type 2 diabetes mellitus with hyperglycemia: Secondary | ICD-10-CM

## 2017-09-14 DIAGNOSIS — R5383 Other fatigue: Secondary | ICD-10-CM | POA: Insufficient documentation

## 2017-09-14 DIAGNOSIS — I10 Essential (primary) hypertension: Secondary | ICD-10-CM

## 2017-09-14 DIAGNOSIS — N1330 Unspecified hydronephrosis: Secondary | ICD-10-CM | POA: Diagnosis not present

## 2017-09-14 DIAGNOSIS — K219 Gastro-esophageal reflux disease without esophagitis: Secondary | ICD-10-CM | POA: Diagnosis not present

## 2017-09-14 DIAGNOSIS — M25511 Pain in right shoulder: Secondary | ICD-10-CM

## 2017-09-14 DIAGNOSIS — C785 Secondary malignant neoplasm of large intestine and rectum: Secondary | ICD-10-CM

## 2017-09-14 DIAGNOSIS — R590 Localized enlarged lymph nodes: Secondary | ICD-10-CM | POA: Insufficient documentation

## 2017-09-14 DIAGNOSIS — C7911 Secondary malignant neoplasm of bladder: Secondary | ICD-10-CM

## 2017-09-14 DIAGNOSIS — Z191 Hormone sensitive malignancy status: Secondary | ICD-10-CM | POA: Diagnosis not present

## 2017-09-14 DIAGNOSIS — E785 Hyperlipidemia, unspecified: Secondary | ICD-10-CM

## 2017-09-14 DIAGNOSIS — C61 Malignant neoplasm of prostate: Secondary | ICD-10-CM

## 2017-09-14 DIAGNOSIS — I6522 Occlusion and stenosis of left carotid artery: Secondary | ICD-10-CM

## 2017-09-14 DIAGNOSIS — R509 Fever, unspecified: Secondary | ICD-10-CM

## 2017-09-14 LAB — CBC WITH DIFFERENTIAL/PLATELET
Basophils Absolute: 0 10*3/uL (ref 0–0.1)
Basophils Relative: 1 %
Eosinophils Absolute: 0.2 10*3/uL (ref 0–0.7)
Eosinophils Relative: 2 %
HCT: 30.8 % — ABNORMAL LOW (ref 40.0–52.0)
Hemoglobin: 10.3 g/dL — ABNORMAL LOW (ref 13.0–18.0)
Lymphocytes Relative: 13 %
Lymphs Abs: 1.2 10*3/uL (ref 1.0–3.6)
MCH: 27.3 pg (ref 26.0–34.0)
MCHC: 33.3 g/dL (ref 32.0–36.0)
MCV: 82.1 fL (ref 80.0–100.0)
Monocytes Absolute: 1.1 10*3/uL — ABNORMAL HIGH (ref 0.2–1.0)
Monocytes Relative: 12 %
Neutro Abs: 7.1 10*3/uL — ABNORMAL HIGH (ref 1.4–6.5)
Neutrophils Relative %: 72 %
Platelets: 418 10*3/uL (ref 150–440)
RBC: 3.76 MIL/uL — ABNORMAL LOW (ref 4.40–5.90)
RDW: 15 % — ABNORMAL HIGH (ref 11.5–14.5)
WBC: 9.6 10*3/uL (ref 3.8–10.6)

## 2017-09-14 LAB — COMPREHENSIVE METABOLIC PANEL
ALT: 24 U/L (ref 0–44)
AST: 24 U/L (ref 15–41)
Albumin: 2.9 g/dL — ABNORMAL LOW (ref 3.5–5.0)
Alkaline Phosphatase: 91 U/L (ref 38–126)
Anion gap: 11 (ref 5–15)
BUN: 33 mg/dL — ABNORMAL HIGH (ref 8–23)
CO2: 17 mmol/L — ABNORMAL LOW (ref 22–32)
Calcium: 8.7 mg/dL — ABNORMAL LOW (ref 8.9–10.3)
Chloride: 102 mmol/L (ref 98–111)
Creatinine, Ser: 1.17 mg/dL (ref 0.61–1.24)
GFR calc Af Amer: >60 mL/min (ref >60)
GFR calc non Af Amer: 56 mL/min — ABNORMAL LOW (ref >60)
Glucose, Bld: 143 mg/dL — ABNORMAL HIGH (ref 70–99)
Potassium: 3.6 mmol/L (ref 3.5–5.1)
Sodium: 130 mmol/L — ABNORMAL LOW (ref 135–145)
Total Bilirubin: 0.3 mg/dL (ref 0.3–1.2)
Total Protein: 7.7 g/dL (ref 6.5–8.1)

## 2017-09-14 LAB — PSA: Prostatic Specific Antigen: 0.15 ng/mL (ref 0.00–4.00)

## 2017-09-14 MED ORDER — LEUPROLIDE ACETATE (6 MONTH) 45 MG IM KIT
45.0000 mg | PACK | Freq: Once | INTRAMUSCULAR | Status: AC
  Administered 2017-09-14: 45 mg via INTRAMUSCULAR
  Filled 2017-09-14: qty 45

## 2017-09-14 MED ORDER — NITROFURANTOIN MONOHYD MACRO 100 MG PO CAPS: 100.0000 mg | ORAL_CAPSULE | Freq: Two times a day (BID) | ORAL | 0 refills | Status: DC

## 2017-09-14 NOTE — Assessment & Plan Note (Addendum)
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  On Xtandi; June 2019 CT scan shows significant improvement of prostate/pelvic soft tissue lesions/retroperitoneal lymphadenopathy; and bilateral hydronephrosis.  Clinically stable.  #Extreme fatigue-likely secondary to [facility].  Recommend holding Xtandi for 2 weeks; await for clinical improvement-and if improves recommend starting 2 pills a day.  Continue Lupron every 6 months.  Injection #1 today.  #Obstructive uropathy-status post bilateral stenting [re-changed]; stable.  # Low grade fever- 100.1; reviewed the recent urine cultures-E. coli sensitive to Macrobid; proceed with Macrobid 100 mg twice a day for 7 days.  #Shoulder pain right-with movement-clinically less likely from metastatic disease.  Reviewed the recent PET scan done.  Question arthritis; discussed regarding shoulder x-ray/steroid injection.  Recommend follow-up with PCP  # HOLD x-tandi x2 week; and then re-start. Follow up in 4 weeks/labs.   Dr. Ginette Pitman

## 2017-09-14 NOTE — Progress Notes (Signed)
[place] OFFICE PROGRESS NOTE  Patient Care Team: [name], MD as PCP - General (Internal Medicine)  Cancer Staging No matching staging information was found for the patient.   Oncology History   # FEB 2019-Metastatic prostate cancer/ castrate sensitive [bulky retroperitoneal adenopathy; invasion into the bladder; rectum] Bladder Bx- prostate. On Degarelix  # April 3rd 2019- [name] [156md];  Lupron q 6 M [[id number]]  # Poorly controlled DM-on insulin  # s/p right PCN [explanted]/ Foley cath [Dr.Brandon]; multiple UTIs.  ----------------------------------------------------------------- DIAGNOSIS: [ FEB 2019] MET. PROSTATE CA  STAGE:   IV ;GOALS: PALLIATIVE  CURRENT/MOST RECENT THERAPY [ Feb-April2019] Lupron+ [name]      Prostate cancer ([facility]      INTERVAL HISTORY:  Michael [id number].o.  male pleasant patient above history of metastatic prostate cancer castrate sensitive currently on Xtandi/Lupron is here for follow-up.  Patient had a recent exchange of ureteral stents.  Is also treated with IV imipenem prior to his procedure-given positive for Pseudomonas/E. Coli-multidrug-resistant.  As per the daughter patient complaining of low-grade fevers up to 100.1; with malaise.  No chills.  He is currently not straight cathing.   Also complains of shoulder pain with extreme adduction and abduction movements.  No pain anywhere else.  As per the daughter patient complained of extreme fatigue; poor appetite.  No nausea no vomiting.   Review of Systems  Constitutional: Positive for malaise/fatigue and weight loss. Negative for chills, diaphoresis and fever.  HENT: Negative for nosebleeds and sore throat.   Eyes: Negative for double vision.  Respiratory: Negative for cough, hemoptysis, sputum production, shortness of breath and wheezing.   Cardiovascular: Negative for chest pain, palpitations, orthopnea and leg swelling.  Gastrointestinal:  Negative for abdominal pain, blood in stool, constipation, diarrhea, heartburn, melena, nausea and vomiting.  Genitourinary: Negative for dysuria, frequency and urgency.  Musculoskeletal: Positive for joint pain and myalgias. Negative for back pain.  Skin: Negative.  Negative for itching and rash.  Neurological: Negative for dizziness, tingling, focal weakness, weakness and headaches.  Endo/Heme/Allergies: Does not bruise/bleed easily.  Psychiatric/Behavioral: Negative for depression. The patient is not nervous/anxious and does not have insomnia.       PAST MEDICAL HISTORY :  Past Medical History:  Diagnosis Date  . Benign prostatic hypertrophy   . Bilateral carotid artery disease (HCC)    Mild plaque formation without obstructive disease noted on carotid Doppler.  . Coronary artery disease    [facility] The patient was told about 2 blockages which did not require revascularization.  . [place]   . Essential hypertension   . GERD (gastroesophageal reflux disease)   . Hyperlipidemia   . Hypertension   . Left carotid artery stenosis   . [place]    PAST SURGICAL HISTORY :   Past Surgical History:  Procedure Laterality Date  . CARDIAC CATHETERIZATION  2010  . CARDIAC CATHETERIZATION N/A 02/03/2015   Procedure: Left Heart Cath and Coronary Angiography;  Surgeon: MWellington [name] MD;  [facility];  Service: Cardiovascular;  Laterality: N/A;  . CATARACT EXTRACTION    . CYSTOSCOPY W/ RETROGRADES Bilateral 04/11/2017   Procedure: CYSTOSCOPY WITH RETROGRADE PYELOGRAM;  Surgeon: BHollice Espy MD;  Location: [facility] OR[facility];  Service: Urology;  Laterality: Bilateral;  . CYSTOSCOPY W/ RETROGRADES Bilateral 09/12/2017   Procedure: CYSTOSCOPY WITH RETROGRADE PYELOGRAM;  Surgeon: BHollice Espy MD;  Location: [facility] OR[facility];  Service: Urology;  Laterality: Bilateral;  . CYSTOSCOPY W/  URETERAL STENT PLACEMENT Bilateral 09/12/2017   Procedure: CYSTOSCOPY WITH STENT REPLACEMENT;  Surgeon: [name], MD;  Location: [facility] OR[facility];  Service: Urology;  Laterality: Bilateral;  . CYSTOSCOPY WITH STENT PLACEMENT Left 04/11/2017   Procedure: CYSTOSCOPY WITH STENT PLACEMENT and fulgeration;  Surgeon: [name], MD;  Location: [facility] ORS;  Service: Urology;  Laterality: Left;  . CYSTOSCOPY WITH URETHRAL DILATATION Bilateral 04/11/2017   Procedure: CYSTOSCOPY WITH URETHRAL DILATATION;  Surgeon: [name], MD;  Location: [facility] OR[facility];  Service: Urology;  Laterality: Bilateral;  . IR CONVERT RIGHT NEPHROSTOMY TO NEPHROURETERAL CATH  05/21/2017  . IR NEPHROSTOMY PLACEMENT RIGHT  04/13/2017  . PROSTATECTOMY      FAMILY HISTORY :   Family History  Problem Relation Age of Onset  . Hypertension Father     SOCIAL HISTORY:   Social History   Tobacco Use  . Smoking status: Never Smoker  . Smokeless tobacco: Never Used  Substance Use Topics  . Alcohol use: No  . Drug use: No    ALLERGIES:  is allergic to metformin.  MEDICATIONS:  Current Outpatient Medications  Medication Sig Dispense Refill  . acetaminophen (TYLENOL) 500 MG tablet Take 500 mg by mouth every 4 (four) hours as needed for moderate pain or fever.    [name] albuterol (PROVENTIL HFA;VENTOLIN HFA) 108 (90 Base) MCG/ACT inhaler Inhale 2 puffs into the lungs every 4 (four) hours as needed for wheezing or shortness of breath.    [name] atorvastatin (LIPITOR) 10 MG tablet Take 10 mg by mouth daily at 6 PM.     . Calcium Carb-Cholecalciferol (CALCIUM 600+D3) 600-800 MG-UNIT TABS Take 1 tablet by mouth daily.    [name] glucose blood (ACCU-CHEK GUIDE) test strip Use asdirected three times a day diag E11.65 100 each 3  . ibuprofen (ADVIL,MOTRIN) 200 MG tablet Take 200 mg by mouth every 6 (six) hours as needed for fever or moderate pain.     [name] insulin glargine (LANTUS) 100 UNIT/ML injection Inject 18 Units into the skin every morning. Based on  sugar levels in am.     . insulin lispro (HUMALOG) 100 UNIT/ML injection Inject 8 Units into the skin See admin instructions. Before dinner    . JARDIANCE 25 MG TABS tablet Take 25 mg by mouth daily.  3  . lisinopril (PRINIVIL,ZESTRIL) 2.5 MG tablet Take 2.5 mg by mouth daily.  4  . megestrol (MEGACE) 40 MG/ML suspension Take 10 mLs by mouth daily.    . metoprolol succinate (TOPROL-XL) 25 MG 24 hr tablet Take 25 mg by mouth daily.   6  . Multiple Vitamins-Minerals (MULTIVITAMIN WITH MINERALS) tablet Take 1 tablet by mouth daily.     [name] omeprazole (PRILOSEC) 20 MG capsule TAKE ONE CAPSULE BY MOUTH EVERY DAY 90 capsule 2  . XTANDI 40 MG capsule TAKE 4 CAPSULES (160 MG TOTAL) BY MOUTH DAILY. 120 capsule 3  . nitrofurantoin, macrocrystal-monohydrate, (MACROBID) 100 MG capsule Take 1 capsule (100 mg total) by mouth 2 (two) times daily. 14 capsule 0   No current facility-administered medications for this visit.     PHYSICAL EXAMINATION: ECOG PERFORMANCE STATUS: 1 - Symptomatic but completely ambulatory  BP 123/76 (BP Location: Left Arm, Patient Position: Sitting)   Pulse 99   Temp 97.8 F (36.6 C) (Tympanic) Comment: daughter gave patient ibuprofen at 830 am  Resp 16   Ht 5' 8" (1.727 m)   Wt 121 lb (54.9 kg)   BMI 18.40 kg/m  Filed Weights   09/14/17 1050  Weight: 121 lb (54.9 kg)    GENERAL: Well-nourished well-developed; Alert, no distress and comfortable.  Accompanied by his daughter. EYES: no pallor or icterus OROPHARYNX: no thrush or ulceration; NECK: supple; no lymph nodes felt. LYMPH:  no palpable lymphadenopathy in the axillary or inguinal regions LUNGS: Decreased breath sounds auscultation bilaterally. No wheeze or crackles HEART/CVS: regular rate & rhythm and no murmurs; No lower extremity edema ABDOMEN:abdomen soft, non-tender and normal bowel sounds. No hepatomegaly or splenomegaly.  Musculoskeletal:no cyanosis of digits and no clubbing  PSYCH: alert & oriented x 3  with fluent speech NEURO: no focal motor/sensory deficits SKIN:  no rashes or significant lesions    LABORATORY DATA:  I have reviewed the data as listed    Component Value Date/Time   NA 130 (L) 09/14/2017 1016   NA 136 05/07/2012 1328   K 3.6 09/14/2017 1016   K 4.5 05/07/2012 1328   CL 102 09/14/2017 1016   CL 104 05/07/2012 1328   CO2 17 (L) 09/14/2017 1016   CO2 28 05/07/2012 1328   GLUCOSE 143 (H) 09/14/2017 1016   GLUCOSE 73 05/07/2012 1328   BUN 33 (H) 09/14/2017 1016   BUN 16 05/07/2012 1328   CREATININE 1.17 09/14/2017 1016   CREATININE 0.84 05/07/2012 1328   CALCIUM 8.7 (L) 09/14/2017 1016   CALCIUM 8.8 05/07/2012 1328   PROT 7.7 09/14/2017 1016   ALBUMIN 2.9 (L) 09/14/2017 1016   AST 24 09/14/2017 1016   ALT 24 09/14/2017 1016   ALKPHOS 91 09/14/2017 1016   BILITOT 0.3 09/14/2017 1016   GFRNONAA 56 (L) 09/14/2017 1016   GFRNONAA >60 05/07/2012 1328   GFRAA >60 09/14/2017 1016   GFRAA >60 05/07/2012 1328    No results found for: SPEP, UPEP  Lab Results  Component Value Date   WBC 9.6 09/14/2017   NEUTROABS 7.1 (H) 09/14/2017   HGB 10.3 (L) 09/14/2017   HCT 30.8 (L) 09/14/2017   MCV 82.1 09/14/2017   PLT 418 09/14/2017      Chemistry      Component Value Date/Time   NA 130 (L) 09/14/2017 1016   NA 136 05/07/2012 1328   K 3.6 09/14/2017 1016   K 4.5 05/07/2012 1328   CL 102 09/14/2017 1016   CL 104 05/07/2012 1328   CO2 17 (L) 09/14/2017 1016   CO2 28 05/07/2012 1328   BUN 33 (H) 09/14/2017 1016   BUN 16 05/07/2012 1328   CREATININE 1.17 09/14/2017 1016   CREATININE 0.84 05/07/2012 1328      Component Value Date/Time   CALCIUM 8.7 (L) 09/14/2017 1016   CALCIUM 8.8 05/07/2012 1328   ALKPHOS 91 09/14/2017 1016   AST 24 09/14/2017 1016   ALT 24 09/14/2017 1016   BILITOT 0.3 09/14/2017 1016       RADIOGRAPHIC STUDIES: I have personally reviewed the radiological images as listed and agreed with the findings in the report. No results  found.   ASSESSMENT & PLAN:  Prostate cancer [facility]) # Metastatic prostate cancer/ castrate sensitive [bulky retroperitoneal adenopathy; invasion into the bladder; rectum].  On Xtandi; June 2019 CT scan shows significant improvement of prostate/pelvic soft tissue lesions/retroperitoneal lymphadenopathy; and bilateral hydronephrosis.  Clinically stable.  #Extreme fatigue-likely secondary to [facility].  Recommend holding Xtandi for 2 weeks; await for clinical improvement-and if improves recommend starting 2 pills a day.  Continue Lupron every 6 months.  Injection #1 today.  #Obstructive uropathy-status post bilateral stenting [re-changed];  stable.  # Low grade fever- 100.1; reviewed the recent urine cultures-E. coli sensitive to Macrobid; proceed with Macrobid 100 mg twice a day for 7 days.  #Shoulder pain right-with movement-clinically less likely from metastatic disease.  Reviewed the recent PET scan done.  Question arthritis; discussed regarding shoulder x-ray/steroid injection.  Recommend follow-up with PCP  # HOLD x-tandi x2 week; and then re-start. Follow up in 4 weeks/labs.   Dr. Ginette Pitman    No orders of the defined types were placed in this encounter.  All questions were answered. The patient knows to call the clinic with any problems, questions or concerns.      [name], MD 09/14/2017 1:27 PM

## 2017-09-14 NOTE — Progress Notes (Signed)
Temp at home 99.7 today; last night temp 100.1; patient felt like he was "running a fever" daughter alternating tylenol/ibuprofen 1 tablet every 6-8 hours. Last ibuprofen taken at 8:30 am  Patient c/o persistent right shoulder pain over the last month. decrease range of motion in right shoulder.

## 2017-09-17 ENCOUNTER — Telehealth: Payer: Self-pay | Admitting: *Deleted

## 2017-09-17 NOTE — Telephone Encounter (Signed)
-----   Message from [name], MD sent at 09/14/2017  9:18 PM EDT ----- Please inform daughter that PSA is fairly stable; no new recommendations follow-up as planned

## 2017-09-17 NOTE — Telephone Encounter (Signed)
Spoke with daughter - Hina. psa stable. Daughter thanked me for calling her with these results.

## 2017-09-25 ENCOUNTER — Ambulatory Visit: Payer: Self-pay | Admitting: Internal Medicine

## 2017-09-27 ENCOUNTER — Encounter: Payer: Self-pay | Admitting: *Deleted

## 2017-09-27 ENCOUNTER — Telehealth: Payer: Self-pay | Admitting: *Deleted

## 2017-09-27 NOTE — Telephone Encounter (Signed)
Patient is at Dentist office and the letter requested for clearance did not get received so they want to cancel his appointment for teeth extraction. Daughter requests we please send clearance to [place].   She is also asking what dose of chemotherapy pills he is to take

## 2017-09-27 NOTE — Telephone Encounter (Signed)
Dental clearance note faxed

## 2017-09-27 NOTE — Telephone Encounter (Signed)
What about his chemotherapy dose?

## 2017-09-28 NOTE — Telephone Encounter (Signed)
Per Dr. Jacinto Reap, patient may take 2 pills Xtandi daily until further instruction at next visit. Patient's daughter notified & verbalized understanding.

## 2017-10-10 ENCOUNTER — Other Ambulatory Visit: Payer: Self-pay | Admitting: *Deleted

## 2017-10-10 ENCOUNTER — Encounter: Payer: Self-pay | Admitting: Internal Medicine

## 2017-10-10 ENCOUNTER — Other Ambulatory Visit: Payer: Self-pay | Admitting: Internal Medicine

## 2017-10-10 DIAGNOSIS — C61 Malignant neoplasm of prostate: Secondary | ICD-10-CM

## 2017-10-10 MED ORDER — ENZALUTAMIDE 40 MG PO CAPS: 80.0000 mg | ORAL_CAPSULE | Freq: Every day | ORAL | 3 refills | Status: DC

## 2017-10-15 ENCOUNTER — Other Ambulatory Visit: Payer: Self-pay | Admitting: Internal Medicine

## 2017-10-15 ENCOUNTER — Inpatient Hospital Stay: Payer: Medicare Other

## 2017-10-15 ENCOUNTER — Inpatient Hospital Stay: Payer: Medicare Other | Attending: Internal Medicine | Admitting: Internal Medicine

## 2017-10-15 VITALS — BP 131/82 | HR 75 | Temp 97.1°F | Resp 16 | Wt 120.0 lb

## 2017-10-15 DIAGNOSIS — E785 Hyperlipidemia, unspecified: Secondary | ICD-10-CM | POA: Diagnosis not present

## 2017-10-15 DIAGNOSIS — M25511 Pain in right shoulder: Secondary | ICD-10-CM

## 2017-10-15 DIAGNOSIS — K219 Gastro-esophageal reflux disease without esophagitis: Secondary | ICD-10-CM | POA: Diagnosis not present

## 2017-10-15 DIAGNOSIS — I2510 Atherosclerotic heart disease of native coronary artery without angina pectoris: Secondary | ICD-10-CM | POA: Diagnosis not present

## 2017-10-15 DIAGNOSIS — Z79899 Other long term (current) drug therapy: Secondary | ICD-10-CM | POA: Insufficient documentation

## 2017-10-15 DIAGNOSIS — C61 Malignant neoplasm of prostate: Secondary | ICD-10-CM | POA: Diagnosis not present

## 2017-10-15 DIAGNOSIS — C7911 Secondary malignant neoplasm of bladder: Secondary | ICD-10-CM | POA: Diagnosis not present

## 2017-10-15 DIAGNOSIS — Z191 Hormone sensitive malignancy status: Secondary | ICD-10-CM | POA: Insufficient documentation

## 2017-10-15 DIAGNOSIS — N1330 Unspecified hydronephrosis: Secondary | ICD-10-CM | POA: Diagnosis not present

## 2017-10-15 DIAGNOSIS — E1165 Type 2 diabetes mellitus with hyperglycemia: Secondary | ICD-10-CM | POA: Insufficient documentation

## 2017-10-15 DIAGNOSIS — Z794 Long term (current) use of insulin: Secondary | ICD-10-CM | POA: Diagnosis not present

## 2017-10-15 DIAGNOSIS — I6522 Occlusion and stenosis of left carotid artery: Secondary | ICD-10-CM | POA: Diagnosis not present

## 2017-10-15 DIAGNOSIS — I10 Essential (primary) hypertension: Secondary | ICD-10-CM | POA: Diagnosis not present

## 2017-10-15 LAB — CBC WITH DIFFERENTIAL/PLATELET
Basophils Absolute: 0 10*3/uL (ref 0–0.1)
Basophils Relative: 0 %
Eosinophils Absolute: 0.1 10*3/uL (ref 0–0.7)
Eosinophils Relative: 1 %
HCT: 31.7 % — ABNORMAL LOW (ref 40.0–52.0)
Hemoglobin: 10.5 g/dL — ABNORMAL LOW (ref 13.0–18.0)
Lymphocytes Relative: 22 %
Lymphs Abs: 1.9 10*3/uL (ref 1.0–3.6)
MCH: 27.4 pg (ref 26.0–34.0)
MCHC: 33 g/dL (ref 32.0–36.0)
MCV: 82.8 fL (ref 80.0–100.0)
Monocytes Absolute: 0.8 10*3/uL (ref 0.2–1.0)
Monocytes Relative: 9 %
Neutro Abs: 5.8 10*3/uL (ref 1.4–6.5)
Neutrophils Relative %: 68 %
Platelets: 379 10*3/uL (ref 150–440)
RBC: 3.82 MIL/uL — ABNORMAL LOW (ref 4.40–5.90)
RDW: 18.2 % — ABNORMAL HIGH (ref 11.5–14.5)
WBC: 8.6 10*3/uL (ref 3.8–10.6)

## 2017-10-15 LAB — COMPREHENSIVE METABOLIC PANEL
ALT: 13 U/L (ref 0–44)
AST: 17 U/L (ref 15–41)
Albumin: 3.3 g/dL — ABNORMAL LOW (ref 3.5–5.0)
Alkaline Phosphatase: 87 U/L (ref 38–126)
Anion gap: 7 (ref 5–15)
BUN: 28 mg/dL — ABNORMAL HIGH (ref 8–23)
CO2: 22 mmol/L (ref 22–32)
Calcium: 9 mg/dL (ref 8.9–10.3)
Chloride: 103 mmol/L (ref 98–111)
Creatinine, Ser: 1.19 mg/dL (ref 0.61–1.24)
GFR calc Af Amer: >60 mL/min (ref >60)
GFR calc non Af Amer: 55 mL/min — ABNORMAL LOW (ref >60)
Glucose, Bld: 224 mg/dL — ABNORMAL HIGH (ref 70–99)
Potassium: 4.3 mmol/L (ref 3.5–5.1)
Sodium: 132 mmol/L — ABNORMAL LOW (ref 135–145)
Total Bilirubin: 0.4 mg/dL (ref 0.3–1.2)
Total Protein: 7.9 g/dL (ref 6.5–8.1)

## 2017-10-15 LAB — PSA: Prostatic Specific Antigen: 0.17 ng/mL (ref 0.00–4.00)

## 2017-10-15 MED FILL — XTANDI 40 MG CAPSULE: 40 | 30 days supply | Qty: 60 | Fill #0

## 2017-10-15 NOTE — Progress Notes (Signed)
[name]  Patient Care Team: [name], MD as PCP - General (Internal Medicine)  Cancer Staging No matching staging information was found for the patient.   Oncology History   # FEB 2019-Metastatic prostate cancer/ castrate sensitive [bulky retroperitoneal adenopathy; invasion into the bladder; rectum] Bladder Bx- prostate. On Degarelix  # April 3rd 2019- [name] [137md];  Lupron q 6 M [[id number]]; MID AUG 2019- reduced dose to 830mday  # Poorly controlled DM-on insulin  # s/p right PCN [explanted]/ Foley cath [Dr.Brandon]; multiple UTIs.  ----------------------------------------------------------------- DIAGNOSIS: [ FEB 2019] MET. PROSTATE CA  STAGE:   IV ;GOALS: PALLIATIVE  CURRENT/MOST RECENT THERAPY [ Feb-April2019] Lupron+ [name]      [name]     INTERVAL HISTORY:  Michael PRITCHARD89.o.  male pleasant patient [name] is here for follow-up.  Patient is also XtGillermina Phyas reduced to 80 mg approximately 2 weeks ago because of significant fatigue.  Patient currently feels significantly improved.  Is been exercising.  Appetite is good.  He continues to have chronic right shoulder pain not any worse.  Review of Systems  Constitutional: Negative for chills, diaphoresis, fever, malaise/fatigue and weight loss.  HENT: Negative for nosebleeds and sore throat.   Eyes: Negative for double vision.  Respiratory: Negative for cough, hemoptysis, sputum production, shortness of breath and wheezing.   Cardiovascular: Negative for chest pain, palpitations, orthopnea and leg swelling.  Gastrointestinal: Negative for abdominal pain, blood in stool, constipation, diarrhea, heartburn, melena, nausea and vomiting.  Genitourinary: Negative for dysuria, frequency and urgency.  Musculoskeletal: Positive for back pain and joint pain.  Skin: Negative.   Negative for itching and rash.  Neurological: Negative for dizziness, tingling, focal weakness, weakness and headaches.  Endo/Heme/Allergies: Does not bruise/bleed easily.  Psychiatric/Behavioral: Negative for depression. The patient is not nervous/anxious and does not have insomnia.       PAST MEDICAL HISTORY :  Past Medical History:  Diagnosis Date  . Benign prostatic hypertrophy   . Bilateral carotid artery disease (HCC)    Mild plaque formation without obstructive disease noted on carotid Doppler.  . Coronary artery disease    [facility] The patient was told about 2 blockages which did not require revascularization.  . Diabetes mellitus without complication ([facility]  . Essential hypertension   . GERD (gastroesophageal reflux disease)   . Hyperlipidemia   . Hypertension   . Left carotid artery stenosis   . [place]state [place]    PAST SURGICAL HISTORY :   Past Surgical History:  Procedure Laterality Date  . CARDIAC CATHETERIZATION  2010  . CARDIAC CATHETERIZATION N/A 02/03/2015   Procedure: Left Heart Cath and Coronary Angiography;  Surgeon: MuWellington HampshireMD;  [facility];  Service: Cardiovascular;  Laterality: N/A;  . CATARACT EXTRACTION    . CYSTOSCOPY W/ RETROGRADES Bilateral 04/11/2017   Procedure: CYSTOSCOPY WITH RETROGRADE PYELOGRAM;  Surgeon: [name];  Location: [facility] OR[facility];  Service: Urology;  Laterality: Bilateral;  . CYSTOSCOPY W/ RETROGRADES Bilateral 09/12/2017   Procedure: CYSTOSCOPY WITH RETROGRADE PYELOGRAM;  Surgeon: [name];  Location: [facility] ORS;  Service: Urology;  Laterality: Bilateral;  . CYSTOSCOPY W/ URETERAL STENT PLACEMENT Bilateral 09/12/2017   Procedure: CYSTOSCOPY WITH STENT REPLACEMENT;  Surgeon: [name];  Location: [facility] ORS;  Service: Urology;  Laterality: Bilateral;  . [place]  Left 04/11/2017   Procedure: CYSTOSCOPY WITH STENT  PLACEMENT and fulgeration;  Surgeon: [name], MD;  Location: [facility] ORS;  Service: Urology;  Laterality: Left;  . CYSTOSCOPY WITH URETHRAL DILATATION Bilateral 04/11/2017   Procedure: CYSTOSCOPY WITH URETHRAL DILATATION;  Surgeon: [name], MD;  Location: [facility] ORS;  Service: Urology;  Laterality: Bilateral;  . IR CONVERT RIGHT NEPHROSTOMY TO NEPHROURETERAL CATH  05/21/2017  . IR NEPHROSTOMY PLACEMENT RIGHT  04/13/2017  . PROSTATECTOMY      FAMILY HISTORY :   Family History  Problem Relation Age of Onset  . Hypertension Father     SOCIAL HISTORY:   Social History   Tobacco Use  . Smoking status: Never Smoker  . Smokeless tobacco: Never Used  Substance Use Topics  . Alcohol use: No  . Drug use: No    ALLERGIES:  is allergic to metformin.  MEDICATIONS:  Current Outpatient Medications  Medication Sig Dispense Refill  . acetaminophen (TYLENOL) 500 MG tablet Take 500 mg by mouth every 4 (four) hours as needed for moderate pain or fever.    [name] albuterol (PROVENTIL HFA;VENTOLIN HFA) 108 (90 Base) MCG/ACT inhaler Inhale 2 puffs into the lungs every 4 (four) hours as needed for wheezing or shortness of breath.    [name] atorvastatin (LIPITOR) 10 MG tablet Take 10 mg by mouth daily at 6 PM.     . Calcium Carb-Cholecalciferol (CALCIUM 600+D3) 600-800 MG-UNIT TABS Take 1 tablet by mouth daily.    . enzalutamide (XTANDI) 40 MG capsule Take 2 capsules (80 mg total) by mouth daily. 60 capsule 3  . glucose blood (ACCU-CHEK GUIDE) test strip Use asdirected three times a day diag E11.65 100 each 3  . ibuprofen (ADVIL,MOTRIN) 200 MG tablet Take 200 mg by mouth every 6 (six) hours as needed for fever or moderate pain.     [name] insulin glargine (LANTUS) 100 UNIT/ML injection Inject 18 Units into the skin every morning. Based on sugar levels in am.     . insulin lispro (HUMALOG) 100 UNIT/ML injection Inject 8 Units into the skin See admin instructions. Before dinner    . JARDIANCE 25 MG TABS tablet  Take 25 mg by mouth daily.  3  . lisinopril (PRINIVIL,ZESTRIL) 2.5 MG tablet Take 2.5 mg by mouth daily.  4  . megestrol (MEGACE) 40 MG/ML suspension Take 10 mLs by mouth daily.    . metoprolol succinate (TOPROL-XL) 25 MG 24 hr tablet Take 25 mg by mouth daily.   6  . Multiple Vitamins-Minerals (MULTIVITAMIN WITH MINERALS) tablet Take 1 tablet by mouth daily.     . nitrofurantoin, macrocrystal-monohydrate, (MACROBID) 100 MG capsule Take 1 capsule (100 mg total) by mouth 2 (two) times daily. 14 capsule 0  . omeprazole (PRILOSEC) 20 MG capsule TAKE ONE CAPSULE BY MOUTH EVERY DAY 90 capsule 2   No current facility-administered medications for this visit.     PHYSICAL EXAMINATION: ECOG PERFORMANCE STATUS: 1 - Symptomatic but completely ambulatory  BP 131/82 (BP Location: Left Arm, Patient Position: Sitting)   Pulse 75   Temp (!) 97.1 F (36.2 C) (Tympanic)   Resp 16   Wt 120 lb (54.4 kg)   BMI 18.25 kg/m   Filed Weights   10/15/17 1022  Weight: 120 lb (54.4 kg)    Physical Exam  Constitutional: He is oriented to person, place, and time and well-developed, well-nourished, and in no distress.  HENT:  Head: Normocephalic and atraumatic.  Mouth/Throat: Oropharynx is clear and moist. No oropharyngeal  exudate.  Eyes: Pupils are equal, round, and reactive to light.  Neck: Normal range of motion. Neck supple.  Cardiovascular: Normal rate and regular rhythm.  Pulmonary/Chest: No respiratory distress. He has no wheezes.  Abdominal: Soft. Bowel sounds are normal. He exhibits no distension and no mass. There is no tenderness. There is no rebound and no guarding.  Musculoskeletal: Normal range of motion. He exhibits no edema or tenderness.  Neurological: He is alert and oriented to person, place, and time.  Skin: Skin is warm.  Psychiatric: Affect normal.       LABORATORY DATA:  I have reviewed the data as listed    Component Value Date/Time   NA 132 (L) 10/15/2017 0931   NA 136  05/07/2012 1328   K 4.3 10/15/2017 0931   K 4.5 05/07/2012 1328   CL 103 10/15/2017 0931   CL 104 05/07/2012 1328   CO2 22 10/15/2017 0931   CO2 28 05/07/2012 1328   GLUCOSE 224 (H) 10/15/2017 0931   GLUCOSE 73 05/07/2012 1328   BUN 28 (H) 10/15/2017 0931   BUN 16 05/07/2012 1328   CREATININE 1.19 10/15/2017 0931   CREATININE 0.84 05/07/2012 1328   CALCIUM 9.0 10/15/2017 0931   CALCIUM 8.8 05/07/2012 1328   PROT 7.9 10/15/2017 0931   ALBUMIN 3.3 (L) 10/15/2017 0931   AST 17 10/15/2017 0931   ALT 13 10/15/2017 0931   ALKPHOS 87 10/15/2017 0931   BILITOT 0.4 10/15/2017 0931   GFRNONAA 55 (L) 10/15/2017 0931   GFRNONAA >60 05/07/2012 1328   GFRAA >60 10/15/2017 0931   GFRAA >60 05/07/2012 1328    No results found for: SPEP, UPEP  Lab Results  Component Value Date   WBC 8.6 10/15/2017   NEUTROABS 5.8 10/15/2017   HGB 10.5 (L) 10/15/2017   HCT 31.7 (L) 10/15/2017   MCV 82.8 10/15/2017   PLT 379 10/15/2017      Chemistry      Component Value Date/Time   NA 132 (L) 10/15/2017 0931   NA 136 05/07/2012 1328   K 4.3 10/15/2017 0931   K 4.5 05/07/2012 1328   CL 103 10/15/2017 0931   CL 104 05/07/2012 1328   CO2 22 10/15/2017 0931   CO2 28 05/07/2012 1328   BUN 28 (H) 10/15/2017 0931   BUN 16 05/07/2012 1328   CREATININE 1.19 10/15/2017 0931   CREATININE 0.84 05/07/2012 1328      Component Value Date/Time   CALCIUM 9.0 10/15/2017 0931   CALCIUM 8.8 05/07/2012 1328   ALKPHOS 87 10/15/2017 0931   AST 17 10/15/2017 0931   ALT 13 10/15/2017 0931   BILITOT 0.4 10/15/2017 0931       RADIOGRAPHIC STUDIES: I have personally reviewed the radiological images as listed and agreed with the findings in the report. No results found.   ASSESSMENT & PLAN:  Prostate cancer [facility]) # Metastatic prostate cancer/ castrate sensitive [bulky retroperitoneal adenopathy; invasion into the bladder; rectum]. Lupron [62mq 646mlast 09/12/2017] On Xtandi; June 2019 CT scan shows  significant improvement of prostate/pelvic soft tissue lesions/retroperitoneal lymphadenopathy; and bilateral hydronephrosis.  Clinically stable.  # Continue [name] 80 mg/day [reduced dose sec to fatigue]; PSA from today pending.  Clinically stable.  #Fat[place] Currently improved on Xtandi 80 mg a day.  #Obstructive uropathy-status post bilateral stenting [re-changed]; STABLE.   # Shoulder pain right-with movement-clinically less likely from metastatic disease.  STABLE.   # Follow up in 4 weeks/labs.  Dr. Ginette Pitman    No orders of the defined types were placed in this encounter.  All questions were answered. The patient knows to call the clinic with any problems, questions or concerns.      [name], MD 10/15/2017 2:09 PM

## 2017-10-15 NOTE — Assessment & Plan Note (Addendum)
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 Lupron [61m q 11m; last 09/12/2017] On Xtandi; June 2019 CT scan shows significant improvement of prostate/pelvic soft tissue lesions/retroperitoneal lymphadenopathy; and bilateral hydronephrosis.  Clinically stable.  # Continue [name] 80 mg/day [reduced dose sec to fatigue]; PSA from today pending.  Clinically stable.  #Fatigue-secondary to [place].  Currently improved on Xtandi 80 mg a day.  #Obstructive uropathy-status post bilateral stenting [re-changed]; STABLE.   # Shoulder pain right-with movement-clinically less likely from metastatic disease.  STABLE.   # Follow up in 4 weeks/labs.    Dr. Ginette Pitman

## 2017-10-17 DIAGNOSIS — Z794 Long term (current) use of insulin: Secondary | ICD-10-CM | POA: Diagnosis not present

## 2017-10-17 DIAGNOSIS — E119 Type 2 diabetes mellitus without complications: Secondary | ICD-10-CM | POA: Diagnosis not present

## 2017-10-17 DIAGNOSIS — M25511 Pain in right shoulder: Secondary | ICD-10-CM | POA: Diagnosis not present

## 2017-10-17 DIAGNOSIS — D649 Anemia, unspecified: Secondary | ICD-10-CM | POA: Diagnosis not present

## 2017-10-17 DIAGNOSIS — C61 Malignant neoplasm of prostate: Secondary | ICD-10-CM | POA: Diagnosis not present

## 2017-10-24 ENCOUNTER — Other Ambulatory Visit: Payer: Self-pay | Admitting: Internal Medicine

## 2017-10-24 ENCOUNTER — Telehealth: Payer: Self-pay | Admitting: Urology

## 2017-10-24 DIAGNOSIS — R339 Retention of urine, unspecified: Secondary | ICD-10-CM

## 2017-10-24 DIAGNOSIS — M25511 Pain in right shoulder: Secondary | ICD-10-CM

## 2017-10-24 DIAGNOSIS — C61 Malignant neoplasm of prostate: Secondary | ICD-10-CM

## 2017-10-24 NOTE — Telephone Encounter (Signed)
Pt dghtr lmom asking for a refill for Myrbetriq 25mg  pharmacy [facility] [address]

## 2017-10-25 DIAGNOSIS — M545 Low back pain: Secondary | ICD-10-CM | POA: Diagnosis not present

## 2017-10-25 DIAGNOSIS — R399 Unspecified symptoms and signs involving the genitourinary system: Secondary | ICD-10-CM | POA: Diagnosis not present

## 2017-10-25 DIAGNOSIS — R350 Frequency of micturition: Secondary | ICD-10-CM | POA: Diagnosis not present

## 2017-10-25 DIAGNOSIS — R8290 Unspecified abnormal findings in urine: Secondary | ICD-10-CM | POA: Diagnosis not present

## 2017-10-25 DIAGNOSIS — Z794 Long term (current) use of insulin: Secondary | ICD-10-CM | POA: Diagnosis not present

## 2017-10-25 DIAGNOSIS — E119 Type 2 diabetes mellitus without complications: Secondary | ICD-10-CM | POA: Diagnosis not present

## 2017-10-26 MED ORDER — MIRABEGRON ER 25 MG PO TB24: 25.0000 mg | ORAL_TABLET | Freq: Every day | ORAL | 11 refills | Status: DC

## 2017-10-26 NOTE — Addendum Note (Signed)
Addended by: Donalee Citrin on: 10/26/2017 04:46 PM   Modules accepted: Orders

## 2017-10-26 NOTE — Telephone Encounter (Signed)
Per notes in medications, it looks like Myrbetriq was d/c due to change in therapy, please advise.

## 2017-10-26 NOTE — Telephone Encounter (Signed)
Per Dr. Cherrie Gauze OR note, she encouraged him to take the Myrbetriq.

## 2017-10-26 NOTE — Telephone Encounter (Signed)
RX sent

## 2017-10-29 DIAGNOSIS — H43391 Other vitreous opacities, right eye: Secondary | ICD-10-CM | POA: Diagnosis not present

## 2017-10-29 DIAGNOSIS — C61 Malignant neoplasm of prostate: Secondary | ICD-10-CM | POA: Diagnosis present

## 2017-10-29 DIAGNOSIS — H3321 Serous retinal detachment, right eye: Secondary | ICD-10-CM | POA: Diagnosis not present

## 2017-10-29 DIAGNOSIS — J320 Chronic maxillary sinusitis: Secondary | ICD-10-CM | POA: Diagnosis not present

## 2017-10-29 DIAGNOSIS — H4419 Other endophthalmitis: Secondary | ICD-10-CM | POA: Diagnosis not present

## 2017-10-29 DIAGNOSIS — E785 Hyperlipidemia, unspecified: Secondary | ICD-10-CM | POA: Diagnosis present

## 2017-10-29 DIAGNOSIS — N3000 Acute cystitis without hematuria: Secondary | ICD-10-CM | POA: Diagnosis not present

## 2017-10-29 DIAGNOSIS — N390 Urinary tract infection, site not specified: Secondary | ICD-10-CM | POA: Diagnosis present

## 2017-10-29 DIAGNOSIS — N400 Enlarged prostate without lower urinary tract symptoms: Secondary | ICD-10-CM | POA: Diagnosis present

## 2017-10-29 DIAGNOSIS — I10 Essential (primary) hypertension: Secondary | ICD-10-CM | POA: Diagnosis present

## 2017-10-29 DIAGNOSIS — Z681 Body mass index (BMI) 19 or less, adult: Secondary | ICD-10-CM | POA: Diagnosis not present

## 2017-10-29 DIAGNOSIS — H40013 Open angle with borderline findings, low risk, bilateral: Secondary | ICD-10-CM | POA: Diagnosis not present

## 2017-10-29 DIAGNOSIS — B369 Superficial mycosis, unspecified: Secondary | ICD-10-CM | POA: Diagnosis not present

## 2017-10-29 DIAGNOSIS — H3091 Unspecified chorioretinal inflammation, right eye: Secondary | ICD-10-CM | POA: Diagnosis not present

## 2017-10-29 DIAGNOSIS — K219 Gastro-esophageal reflux disease without esophagitis: Secondary | ICD-10-CM | POA: Diagnosis present

## 2017-10-29 DIAGNOSIS — E1159 Type 2 diabetes mellitus with other circulatory complications: Secondary | ICD-10-CM | POA: Diagnosis not present

## 2017-10-29 DIAGNOSIS — H47091 Other disorders of optic nerve, not elsewhere classified, right eye: Secondary | ICD-10-CM | POA: Diagnosis not present

## 2017-10-29 DIAGNOSIS — H44001 Unspecified purulent endophthalmitis, right eye: Secondary | ICD-10-CM | POA: Insufficient documentation

## 2017-10-29 DIAGNOSIS — C7911 Secondary malignant neoplasm of bladder: Secondary | ICD-10-CM | POA: Diagnosis present

## 2017-10-29 DIAGNOSIS — B479 Mycetoma, unspecified: Secondary | ICD-10-CM | POA: Diagnosis present

## 2017-10-29 DIAGNOSIS — H47011 Ischemic optic neuropathy, right eye: Secondary | ICD-10-CM | POA: Diagnosis not present

## 2017-10-29 DIAGNOSIS — Z87440 Personal history of urinary (tract) infections: Secondary | ICD-10-CM | POA: Diagnosis not present

## 2017-10-29 DIAGNOSIS — E43 Unspecified severe protein-calorie malnutrition: Secondary | ICD-10-CM | POA: Diagnosis present

## 2017-10-29 DIAGNOSIS — Z794 Long term (current) use of insulin: Secondary | ICD-10-CM | POA: Diagnosis not present

## 2017-10-29 DIAGNOSIS — B9620 Unspecified Escherichia coli [E. coli] as the cause of diseases classified elsewhere: Secondary | ICD-10-CM | POA: Diagnosis present

## 2017-10-29 DIAGNOSIS — E1165 Type 2 diabetes mellitus with hyperglycemia: Secondary | ICD-10-CM | POA: Diagnosis present

## 2017-10-29 DIAGNOSIS — C785 Secondary malignant neoplasm of large intestine and rectum: Secondary | ICD-10-CM | POA: Diagnosis present

## 2017-10-29 DIAGNOSIS — B470 Eumycetoma: Secondary | ICD-10-CM | POA: Diagnosis not present

## 2017-10-29 DIAGNOSIS — H318 Other specified disorders of choroid: Secondary | ICD-10-CM | POA: Diagnosis not present

## 2017-10-29 DIAGNOSIS — H538 Other visual disturbances: Secondary | ICD-10-CM | POA: Diagnosis not present

## 2017-10-30 DIAGNOSIS — H4419 Other endophthalmitis: Secondary | ICD-10-CM | POA: Diagnosis not present

## 2017-10-30 DIAGNOSIS — H3321 Serous retinal detachment, right eye: Secondary | ICD-10-CM | POA: Diagnosis not present

## 2017-10-30 DIAGNOSIS — H40013 Open angle with borderline findings, low risk, bilateral: Secondary | ICD-10-CM | POA: Diagnosis not present

## 2017-10-30 DIAGNOSIS — H47091 Other disorders of optic nerve, not elsewhere classified, right eye: Secondary | ICD-10-CM | POA: Diagnosis not present

## 2017-11-01 DIAGNOSIS — H318 Other specified disorders of choroid: Secondary | ICD-10-CM | POA: Diagnosis not present

## 2017-11-01 DIAGNOSIS — H47011 Ischemic optic neuropathy, right eye: Secondary | ICD-10-CM | POA: Diagnosis not present

## 2017-11-01 DIAGNOSIS — H4419 Other endophthalmitis: Secondary | ICD-10-CM | POA: Diagnosis not present

## 2017-11-01 DIAGNOSIS — B470 Eumycetoma: Secondary | ICD-10-CM | POA: Insufficient documentation

## 2017-11-04 ENCOUNTER — Ambulatory Visit
Admission: RE | Admit: 2017-11-04 | Discharge: 2017-11-04 | Disposition: A | Discharge status: Home / Self Care | Payer: Medicare Other | Source: Ambulatory Visit | Attending: Internal Medicine | Admitting: Internal Medicine

## 2017-11-04 DIAGNOSIS — C7951 Secondary malignant neoplasm of bone: Secondary | ICD-10-CM | POA: Diagnosis not present

## 2017-11-04 DIAGNOSIS — M25511 Pain in right shoulder: Secondary | ICD-10-CM

## 2017-11-04 DIAGNOSIS — X58XXXA Exposure to other specified factors, initial encounter: Secondary | ICD-10-CM | POA: Diagnosis not present

## 2017-11-04 DIAGNOSIS — S43081A Other subluxation of right shoulder joint, initial encounter: Secondary | ICD-10-CM | POA: Insufficient documentation

## 2017-11-05 DIAGNOSIS — C61 Malignant neoplasm of prostate: Secondary | ICD-10-CM | POA: Diagnosis not present

## 2017-11-05 DIAGNOSIS — E1159 Type 2 diabetes mellitus with other circulatory complications: Secondary | ICD-10-CM | POA: Diagnosis not present

## 2017-11-05 DIAGNOSIS — E1165 Type 2 diabetes mellitus with hyperglycemia: Secondary | ICD-10-CM | POA: Diagnosis not present

## 2017-11-06 DIAGNOSIS — H359 Unspecified retinal disorder: Secondary | ICD-10-CM | POA: Diagnosis not present

## 2017-11-06 DIAGNOSIS — H4419 Other endophthalmitis: Secondary | ICD-10-CM | POA: Diagnosis not present

## 2017-11-06 DIAGNOSIS — H35351 Cystoid macular degeneration, right eye: Secondary | ICD-10-CM | POA: Diagnosis not present

## 2017-11-08 DIAGNOSIS — C61 Malignant neoplasm of prostate: Secondary | ICD-10-CM | POA: Diagnosis not present

## 2017-11-08 DIAGNOSIS — E119 Type 2 diabetes mellitus without complications: Secondary | ICD-10-CM | POA: Diagnosis not present

## 2017-11-08 DIAGNOSIS — C7951 Secondary malignant neoplasm of bone: Secondary | ICD-10-CM | POA: Diagnosis not present

## 2017-11-08 DIAGNOSIS — Z09 Encounter for follow-up examination after completed treatment for conditions other than malignant neoplasm: Secondary | ICD-10-CM | POA: Diagnosis not present

## 2017-11-08 DIAGNOSIS — H44001 Unspecified purulent endophthalmitis, right eye: Secondary | ICD-10-CM | POA: Diagnosis not present

## 2017-11-08 DIAGNOSIS — I10 Essential (primary) hypertension: Secondary | ICD-10-CM | POA: Diagnosis not present

## 2017-11-08 DIAGNOSIS — B470 Eumycetoma: Secondary | ICD-10-CM | POA: Diagnosis not present

## 2017-11-08 DIAGNOSIS — H539 Unspecified visual disturbance: Secondary | ICD-10-CM | POA: Diagnosis not present

## 2017-11-08 DIAGNOSIS — Z794 Long term (current) use of insulin: Secondary | ICD-10-CM | POA: Diagnosis not present

## 2017-11-08 DIAGNOSIS — D649 Anemia, unspecified: Secondary | ICD-10-CM | POA: Diagnosis not present

## 2017-11-09 ENCOUNTER — Other Ambulatory Visit: Payer: Self-pay | Admitting: Internal Medicine

## 2017-11-09 ENCOUNTER — Telehealth: Payer: Self-pay

## 2017-11-09 DIAGNOSIS — Z794 Long term (current) use of insulin: Secondary | ICD-10-CM

## 2017-11-09 DIAGNOSIS — E119 Type 2 diabetes mellitus without complications: Secondary | ICD-10-CM

## 2017-11-09 NOTE — Telephone Encounter (Signed)
erro  neous encounter

## 2017-11-09 NOTE — Telephone Encounter (Signed)
Oral Chemotherapy Pharmacy Student Encounter  Follow-Up Form  Called patient today to follow up regarding patient's oral chemotherapy medication: Xtandi (enzalutamide)  Original Start date of oral chemotherapy: 05/2017  Pt reports 2 doses of Xtandi missed in the last month due to surgery on 11/02/17.   Pt reports the following side effects: per daughter patient experiencing fatigue, which has gotten better after dose reduction  New medications?: yes, metformin, no DDIs with Gillermina Phy  Other Issues: none reported  Patient knows to call the office with questions or concerns. Oral Oncology Clinic will continue to follow.  Almira Bar, PharmD Candidate

## 2017-11-09 NOTE — Telephone Encounter (Signed)
PT IS NO LONGER OUR PT PER HIS DAUGHTER.

## 2017-11-12 ENCOUNTER — Other Ambulatory Visit: Payer: Self-pay | Admitting: *Deleted

## 2017-11-12 ENCOUNTER — Inpatient Hospital Stay: Payer: Medicare Other | Attending: Internal Medicine

## 2017-11-12 ENCOUNTER — Encounter: Payer: Self-pay | Admitting: Internal Medicine

## 2017-11-12 ENCOUNTER — Inpatient Hospital Stay (HOSPITAL_BASED_OUTPATIENT_CLINIC_OR_DEPARTMENT_OTHER): Payer: Medicare Other | Admitting: Internal Medicine

## 2017-11-12 VITALS — BP 128/79 | HR 79 | Temp 97.3°F | Resp 16 | Wt 122.6 lb

## 2017-11-12 DIAGNOSIS — E1165 Type 2 diabetes mellitus with hyperglycemia: Secondary | ICD-10-CM | POA: Insufficient documentation

## 2017-11-12 DIAGNOSIS — C61 Malignant neoplasm of prostate: Secondary | ICD-10-CM | POA: Insufficient documentation

## 2017-11-12 DIAGNOSIS — I2510 Atherosclerotic heart disease of native coronary artery without angina pectoris: Secondary | ICD-10-CM | POA: Insufficient documentation

## 2017-11-12 DIAGNOSIS — Z87440 Personal history of urinary (tract) infections: Secondary | ICD-10-CM

## 2017-11-12 DIAGNOSIS — K219 Gastro-esophageal reflux disease without esophagitis: Secondary | ICD-10-CM | POA: Diagnosis not present

## 2017-11-12 DIAGNOSIS — D649 Anemia, unspecified: Secondary | ICD-10-CM | POA: Diagnosis not present

## 2017-11-12 DIAGNOSIS — I10 Essential (primary) hypertension: Secondary | ICD-10-CM | POA: Insufficient documentation

## 2017-11-12 DIAGNOSIS — E785 Hyperlipidemia, unspecified: Secondary | ICD-10-CM | POA: Insufficient documentation

## 2017-11-12 DIAGNOSIS — Z794 Long term (current) use of insulin: Secondary | ICD-10-CM

## 2017-11-12 DIAGNOSIS — I6522 Occlusion and stenosis of left carotid artery: Secondary | ICD-10-CM | POA: Diagnosis not present

## 2017-11-12 DIAGNOSIS — Z79899 Other long term (current) drug therapy: Secondary | ICD-10-CM | POA: Diagnosis not present

## 2017-11-12 DIAGNOSIS — M25511 Pain in right shoulder: Secondary | ICD-10-CM | POA: Insufficient documentation

## 2017-11-12 LAB — COMPREHENSIVE METABOLIC PANEL
ALT: 14 U/L (ref 0–44)
AST: 20 U/L (ref 15–41)
Albumin: 3.6 g/dL (ref 3.5–5.0)
Alkaline Phosphatase: 77 U/L (ref 38–126)
Anion gap: 8 (ref 5–15)
BUN: 21 mg/dL (ref 8–23)
CO2: 21 mmol/L — ABNORMAL LOW (ref 22–32)
Calcium: 9.1 mg/dL (ref 8.9–10.3)
Chloride: 103 mmol/L (ref 98–111)
Creatinine, Ser: 1 mg/dL (ref 0.61–1.24)
GFR calc Af Amer: >60 mL/min (ref >60)
GFR calc non Af Amer: >60 mL/min (ref >60)
Glucose, Bld: 207 mg/dL — ABNORMAL HIGH (ref 70–99)
Potassium: 4.4 mmol/L (ref 3.5–5.1)
Sodium: 132 mmol/L — ABNORMAL LOW (ref 135–145)
Total Bilirubin: 0.5 mg/dL (ref 0.3–1.2)
Total Protein: 7.9 g/dL (ref 6.5–8.1)

## 2017-11-12 LAB — CBC WITH DIFFERENTIAL/PLATELET
Basophils Absolute: 0 10*3/uL (ref 0–0.1)
Basophils Relative: 1 %
Eosinophils Absolute: 0.1 10*3/uL (ref 0–0.7)
Eosinophils Relative: 1 %
HCT: 28.4 % — ABNORMAL LOW (ref 40.0–52.0)
Hemoglobin: 9.5 g/dL — ABNORMAL LOW (ref 13.0–18.0)
Lymphocytes Relative: 21 %
Lymphs Abs: 1.7 10*3/uL (ref 1.0–3.6)
MCH: 27.9 pg (ref 26.0–34.0)
MCHC: 33.5 g/dL (ref 32.0–36.0)
MCV: 83.4 fL (ref 80.0–100.0)
Monocytes Absolute: 0.9 10*3/uL (ref 0.2–1.0)
Monocytes Relative: 11 %
Neutro Abs: 5.6 10*3/uL (ref 1.4–6.5)
Neutrophils Relative %: 66 %
Platelets: 345 10*3/uL (ref 150–440)
RBC: 3.41 MIL/uL — ABNORMAL LOW (ref 4.40–5.90)
RDW: 19.5 % — ABNORMAL HIGH (ref 11.5–14.5)
WBC: 8.3 10*3/uL (ref 3.8–10.6)

## 2017-11-12 LAB — IRON AND TIBC
Iron: 59 ug/dL (ref 45–182)
Saturation Ratios: 18 % (ref 17.9–39.5)
TIBC: 333 ug/dL (ref 250–450)
UIBC: 274 ug/dL

## 2017-11-12 LAB — PSA: Prostatic Specific Antigen: 0.08 ng/mL (ref 0.00–4.00)

## 2017-11-12 LAB — FERRITIN: Ferritin: 41 ng/mL (ref 24–336)

## 2017-11-12 NOTE — Progress Notes (Signed)
[place] OFFICE PROGRESS NOTE  Patient Care Team: [name], MD as PCP - General (Internal Medicine)  Cancer Staging No matching staging information was found for the patient.   Oncology History   # FEB 2019-Metastatic prostate cancer/ castrate sensitive [bulky retroperitoneal adenopathy; invasion into the bladder; rectum] Bladder Bx- prostate. On Degarelix  # April 3rd 2019- [name] [143md];  Lupron q 6 M [[id number]]; MID AUG 2019- reduced dose to 878mday  # Poorly controlled DM-on insulin  # s/p right PCN [explanted]/ Foley cath [Dr.Brandon]; multiple UTIs.  ----------------------------------------------------------------- DIAGNOSIS: [ FEB 2019] MET. PROSTATE CA  STAGE:   IV ;GOALS: PALLIATIVE  CURRENT/MOST RECENT THERAPY [ Feb-April2019] Lupron+ [name]      [facility]     INTERVAL HISTORY:  Michael HILLYARD89.o.  male pleasant patient [name] is here for follow-up.  In the interim patient [facility]iven his difficulty with vision.  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  Patient is currently using prednisone drops.  Patient is also XtGillermina Phyas reduced to 80 mg approximately 2 weeks ago because of significant fatigue.  Patient continues to have right shoulder pain; not worse with movement.  Is currently taking Tylenol/Advil.  Review of Systems  Constitutional: Negative for chills, diaphoresis, fever, malaise/fatigue and weight loss.  HENT: Negative for nosebleeds and sore throat.   Eyes: Positive for blurred vision. Negative for double vision.  Respiratory: Negative for cough, hemoptysis, sputum production, shortness of breath and wheezing.   Cardiovascular: Negative for chest  pain, palpitations, orthopnea and leg swelling.  Gastrointestinal: Negative for abdominal pain, blood in stool, constipation, diarrhea, heartburn, melena, nausea and vomiting.  Genitourinary: Negative for dysuria, frequency and urgency.  Musculoskeletal: Positive for back pain and joint pain.  Skin: Negative.  Negative for itching and rash.  Neurological: Negative for dizziness, tingling, focal weakness, weakness and headaches.  Endo/Heme/Allergies: Does not bruise/bleed easily.  Psychiatric/Behavioral: Negative for depression. The patient is not nervous/anxious and does not have insomnia.       PAST MEDICAL HISTORY :  Past Medical History:  Diagnosis Date  . Benign prostatic hypertrophy   . Bilateral carotid artery disease (HCC)    Mild plaque formation without obstructive disease noted on carotid Doppler.  . Coronary artery disease    [facility] The patient was told about 2 blockages which did not require revascularization.  . Diabetes mellitus without complication ([facility]  . Essential hypertension   . GERD (gastroesophageal reflux disease)   . Hyperlipidemia   . Hypertension   . Left carotid artery stenosis   . [place] ([facility]    PAST SURGICAL HISTORY :   Past Surgical History:  Procedure Laterality Date  . CARDIAC CATHETERIZATION  2010  . CARDIAC CATHETERIZATION N/A 02/03/2015   Procedure: Left Heart Cath and Coronary Angiography;  Surgeon: MuWellington HampshireMD;  [facility];  Service: Cardiovascular;  Laterality: N/A;  . CATARACT EXTRACTION    . CYSTOSCOPY W/ RETROGRADES Bilateral 04/11/2017   Procedure: CYSTOSCOPY WITH RETROGRADE PYELOGRAM;  Surgeon: [name];  Location: [facility] OR[facility];  Service: Urology;  Laterality: Bilateral;  . CYSTOSCOPY W/ RETROGRADES Bilateral 09/12/2017   Procedure: CYSTOSCOPY WITH RETROGRADE PYELOGRAM;  Surgeon: [name];  Location: [facility] OR[facility];  Service:  Urology;   Laterality: Bilateral;  . CYSTOSCOPY W/ URETERAL STENT PLACEMENT Bilateral 09/12/2017   Procedure: CYSTOSCOPY WITH STENT REPLACEMENT;  Surgeon: [name], MD;  Location: [facility] ORS;  Service: Urology;  Laterality: Bilateral;  . CYSTOSCOPY WITH STENT PLACEMENT Left 04/11/2017   Procedure: CYSTOSCOPY WITH STENT PLACEMENT and fulgeration;  Surgeon: [name], MD;  Location: [facility] ORS;  Service: Urology;  Laterality: Left;  . CYSTOSCOPY WITH URETHRAL DILATATION Bilateral 04/11/2017   Procedure: CYSTOSCOPY WITH URETHRAL DILATATION;  Surgeon: [name], MD;  Location: [facility] OR[facility];  Service: Urology;  Laterality: Bilateral;  . IR CONVERT RIGHT NEPHROSTOMY TO NEPHROURETERAL CATH  05/21/2017  . IR NEPHROSTOMY PLACEMENT RIGHT  04/13/2017  . PROSTATECTOMY      FAMILY HISTORY :   Family History  Problem Relation Age of Onset  . Hypertension Father     SOCIAL HISTORY:   Social History   Tobacco Use  . Smoking status: Never Smoker  . Smokeless tobacco: Never Used  Substance Use Topics  . Alcohol use: No  . Drug use: No    ALLERGIES:  is allergic to metformin.  MEDICATIONS:  Current Outpatient Medications  Medication Sig Dispense Refill  . acetaminophen (TYLENOL) 500 MG tablet Take 500 mg by mouth every 4 (four) hours as needed for moderate pain or fever.    [name] albuterol (PROVENTIL HFA;VENTOLIN HFA) 108 (90 Base) MCG/ACT inhaler Inhale 2 puffs into the lungs every 4 (four) hours as needed for wheezing or shortness of breath.    [name] atorvastatin (LIPITOR) 10 MG tablet Take 10 mg by mouth daily at 6 PM.     . Calcium Carb-Cholecalciferol (CALCIUM 600+D3) 600-800 MG-UNIT TABS Take 1 tablet by mouth daily.    . enzalutamide (XTANDI) 40 MG capsule Take 2 capsules (80 mg total) by mouth daily. 60 capsule 3  . glucose blood (ACCU-CHEK GUIDE) test strip Use asdirected three times a day diag E11.65 100 each 3  . ibuprofen (ADVIL,MOTRIN) 200 MG tablet Take 200 mg by mouth every 6 (six) hours as  needed for fever or moderate pain.     [name] insulin glargine (LANTUS) 100 UNIT/ML injection Inject 18 Units into the skin every morning. Based on sugar levels in am.     . insulin lispro (HUMALOG) 100 UNIT/ML injection Inject 8 Units into the skin See admin instructions. Before dinner    . lisinopril (PRINIVIL,ZESTRIL) 2.5 MG tablet TAKE 1 TABLET BY MOUTH EVERY DAY 90 tablet 4  . megestrol (MEGACE) 40 MG/ML suspension Take 10 mLs by mouth daily.    [name] METFORMIN HCL PO Take by mouth.    . metoprolol succinate (TOPROL-XL) 25 MG 24 hr tablet TAKE 1 TABLET BY MOUTH EVERY DAY FOR BLOOD PRESSURE 90 tablet 3  . mirabegron ER (MYRBETRIQ) 25 MG TB24 tablet Take 1 tablet (25 mg total) by mouth daily. 30 tablet 11  . Multiple Vitamins-Minerals (MULTIVITAMIN WITH MINERALS) tablet Take 1 tablet by mouth daily.     . nitrofurantoin, macrocrystal-monohydrate, (MACROBID) 100 MG capsule Take 1 capsule (100 mg total) by mouth 2 (two) times daily. 14 capsule 0  . omeprazole (PRILOSEC) 20 MG capsule TAKE ONE CAPSULE BY MOUTH EVERY DAY 90 capsule 2   No current facility-administered medications for this visit.     PHYSICAL EXAMINATION: ECOG PERFORMANCE STATUS: 1 - Symptomatic but completely ambulatory  BP 128/79 (BP Location: Left Arm, Patient Position: Sitting)   Pulse 79   Temp (!) 97.3 F (36.3 C) (Tympanic)   Resp 16  Wt 122 lb 9.6 oz (55.6 kg)   BMI 18.64 kg/m   Filed Weights   11/12/17 0925  Weight: 122 lb 9.6 oz (55.6 kg)    Physical Exam  Constitutional: He is oriented to person, place, and time and well-developed, well-nourished, and in no distress.  Accompanied by his daughter.  HENT:  Head: Normocephalic and atraumatic.  Mouth/Throat: Oropharynx is clear and moist. No oropharyngeal exudate.  Eyes: Pupils are equal, round, and reactive to light.  Neck: Normal range of motion. Neck supple.  Cardiovascular: Normal rate and regular rhythm.  Pulmonary/Chest: No respiratory distress. He has no  wheezes.  Abdominal: Soft. Bowel sounds are normal. He exhibits no distension and no mass. There is no tenderness. There is no rebound and no guarding.  Musculoskeletal: Normal range of motion. He exhibits no edema or tenderness.  Neurological: He is alert and oriented to person, place, and time.  Skin: Skin is warm.  Psychiatric: Affect normal.       LABORATORY DATA:  I have reviewed the data as listed    Component Value Date/Time   NA 132 (L) 11/12/2017 0907   NA 136 05/07/2012 1328   K 4.4 11/12/2017 0907   K 4.5 05/07/2012 1328   CL 103 11/12/2017 0907   CL 104 05/07/2012 1328   CO2 21 (L) 11/12/2017 0907   CO2 28 05/07/2012 1328   GLUCOSE 207 (H) 11/12/2017 0907   GLUCOSE 73 05/07/2012 1328   BUN 21 11/12/2017 0907   BUN 16 05/07/2012 1328   CREATININE 1.00 11/12/2017 0907   CREATININE 0.84 05/07/2012 1328   CALCIUM 9.1 11/12/2017 0907   CALCIUM 8.8 05/07/2012 1328   PROT 7.9 11/12/2017 0907   ALBUMIN 3.6 11/12/2017 0907   AST 20 11/12/2017 0907   ALT 14 11/12/2017 0907   ALKPHOS 77 11/12/2017 0907   BILITOT 0.5 11/12/2017 0907   GFRNONAA >60 11/12/2017 0907   GFRNONAA >60 05/07/2012 1328   GFRAA >60 11/12/2017 0907   GFRAA >60 05/07/2012 1328    No results found for: SPEP, UPEP  Lab Results  Component Value Date   WBC 8.3 11/12/2017   NEUTROABS 5.6 11/12/2017   HGB 9.5 (L) 11/12/2017   HCT 28.4 (L) 11/12/2017   MCV 83.4 11/12/2017   PLT 345 11/12/2017      Chemistry      Component Value Date/Time   NA 132 (L) 11/12/2017 0907   NA 136 05/07/2012 1328   K 4.4 11/12/2017 0907   K 4.5 05/07/2012 1328   CL 103 11/12/2017 0907   CL 104 05/07/2012 1328   CO2 21 (L) 11/12/2017 0907   CO2 28 05/07/2012 1328   BUN 21 11/12/2017 0907   BUN 16 05/07/2012 1328   CREATININE 1.00 11/12/2017 0907   CREATININE 0.84 05/07/2012 1328      Component Value Date/Time   CALCIUM 9.1 11/12/2017 0907   CALCIUM 8.8 05/07/2012 1328   ALKPHOS 77 11/12/2017 0907    AST 20 11/12/2017 0907   ALT 14 11/12/2017 0907   BILITOT 0.5 11/12/2017 2297       RADIOGRAPHIC STUDIES: I have personally reviewed the radiological images as listed and agreed with the findings in the report. No results found.   ASSESSMENT & PLAN:  Prostate cancer [facility]) # Metastatic prostate cancer/ castrate sensitive [bulky retroperitoneal adenopathy; invasion into the bladder; rectum]. Lupron [104mq 618mlast 09/12/2017]   # On Xtandi; June 2019 CT scan shows significant improvement of prostate/pelvic soft tissue  lesions/retroperitoneal lymphadenopathy; and bilateral hydronephrosis.  Clinically stable.  We will plan to get imaging every 4 to 6 months.  # Continue [name] 80 mg/day [reduced dose sec to fatigue]; PSA -September 2019-0.08.  Clinically stable.  #Fatigue-secondary to [place].  Stable  #Obstructive uropathy-status post bilateral stenting [re-changed]; stable  # Anemia-hemoglobin 9.2.  Worse.  Check Iron studies; recommend PO iron q OD.   # Shoulder pain right-with movement-? metastatic disease on MRI shoulder.  Discussed regarding Xgeva discussed hypocalcemia; osteonecrosis of jaw.  Recommend continue calcium plus vitamin D twice a day.  Given dental extraction approximately 1 month ago plan to [facility] in 1 month.  #Right eye vision disturbance/pain-defer to ophthalmology.  # Follow up in 4 weeks/labs-PSA; X-geva.   Cc: Dr. Ginette Pitman    Orders Placed This Encounter  Procedures  . Ferritin    Standing Status:   Future    Number of Occurrences:   1    Standing Expiration Date:   11/13/2018  . Iron and TIBC    Standing Status:   Future    Number of Occurrences:   1    Standing Expiration Date:   11/13/2018  . CBC with Differential/Platelet    Standing Status:   Future    Standing Expiration Date:   11/13/2018  . Comprehensive metabolic panel    Standing Status:   Future    Standing Expiration Date:   11/13/2018   All questions were answered. The patient knows to  call the clinic with any problems, questions or concerns.      [name], MD 11/12/2017 9:30 PM

## 2017-11-12 NOTE — Assessment & Plan Note (Addendum)
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 Lupron [29m q 73m; last 09/12/2017]   # On Xtandi; June 2019 CT scan shows significant improvement of prostate/pelvic soft tissue lesions/retroperitoneal lymphadenopathy; and bilateral hydronephrosis.  Clinically stable.  We will plan to get imaging every 4 to 6 months.  # Continue [name] 80 mg/day [reduced dose sec to fatigue]; PSA -September 2019-0.08.  Clinically stable.  #Fatigue-secondary to [place].  Stable  #Obstructive uropathy-status post bilateral stenting [re-changed]; stable  # Anemia-hemoglobin 9.2.  Worse.  Check Iron studies; recommend PO iron q OD.   # Shoulder pain right-with movement-? metastatic disease on MRI shoulder.  Discussed regarding Xgeva discussed hypocalcemia; osteonecrosis of jaw.  Recommend continue calcium plus vitamin D twice a day.  Given dental extraction approximately 1 month ago plan to [facility] in 1 month.  #Right eye vision disturbance/pain-defer to ophthalmology.  # Follow up in 4 weeks/labs-PSA; X-geva.   Cc: Dr. Ginette Pitman

## 2017-11-13 MED FILL — XTANDI 40 MG CAPSULE: 40 | 30 days supply | Qty: 60 | Fill #1

## 2017-11-16 DIAGNOSIS — J329 Chronic sinusitis, unspecified: Secondary | ICD-10-CM

## 2017-11-16 DIAGNOSIS — B49 Unspecified mycosis: Secondary | ICD-10-CM | POA: Diagnosis not present

## 2017-11-16 DIAGNOSIS — Z9889 Other specified postprocedural states: Secondary | ICD-10-CM | POA: Diagnosis not present

## 2017-12-10 ENCOUNTER — Inpatient Hospital Stay (HOSPITAL_BASED_OUTPATIENT_CLINIC_OR_DEPARTMENT_OTHER): Payer: Medicare Other | Admitting: Internal Medicine

## 2017-12-10 ENCOUNTER — Inpatient Hospital Stay: Payer: Medicare Other | Attending: Internal Medicine

## 2017-12-10 ENCOUNTER — Inpatient Hospital Stay: Payer: Medicare Other

## 2017-12-10 ENCOUNTER — Encounter: Payer: Self-pay | Admitting: Internal Medicine

## 2017-12-10 VITALS — BP 123/71 | HR 71 | Temp 97.5°F | Resp 18 | Wt 128.2 lb

## 2017-12-10 DIAGNOSIS — I6522 Occlusion and stenosis of left carotid artery: Secondary | ICD-10-CM | POA: Diagnosis not present

## 2017-12-10 DIAGNOSIS — D649 Anemia, unspecified: Secondary | ICD-10-CM | POA: Diagnosis not present

## 2017-12-10 DIAGNOSIS — K219 Gastro-esophageal reflux disease without esophagitis: Secondary | ICD-10-CM

## 2017-12-10 DIAGNOSIS — I10 Essential (primary) hypertension: Secondary | ICD-10-CM | POA: Diagnosis not present

## 2017-12-10 DIAGNOSIS — C61 Malignant neoplasm of prostate: Secondary | ICD-10-CM

## 2017-12-10 DIAGNOSIS — C772 Secondary and unspecified malignant neoplasm of intra-abdominal lymph nodes: Secondary | ICD-10-CM | POA: Diagnosis not present

## 2017-12-10 DIAGNOSIS — I2510 Atherosclerotic heart disease of native coronary artery without angina pectoris: Secondary | ICD-10-CM | POA: Insufficient documentation

## 2017-12-10 DIAGNOSIS — C785 Secondary malignant neoplasm of large intestine and rectum: Secondary | ICD-10-CM | POA: Diagnosis not present

## 2017-12-10 DIAGNOSIS — E785 Hyperlipidemia, unspecified: Secondary | ICD-10-CM | POA: Diagnosis not present

## 2017-12-10 DIAGNOSIS — M25511 Pain in right shoulder: Secondary | ICD-10-CM

## 2017-12-10 DIAGNOSIS — Z794 Long term (current) use of insulin: Secondary | ICD-10-CM | POA: Diagnosis not present

## 2017-12-10 DIAGNOSIS — E1165 Type 2 diabetes mellitus with hyperglycemia: Secondary | ICD-10-CM

## 2017-12-10 DIAGNOSIS — C7911 Secondary malignant neoplasm of bladder: Secondary | ICD-10-CM | POA: Insufficient documentation

## 2017-12-10 DIAGNOSIS — H539 Unspecified visual disturbance: Secondary | ICD-10-CM

## 2017-12-10 DIAGNOSIS — Z79899 Other long term (current) drug therapy: Secondary | ICD-10-CM | POA: Insufficient documentation

## 2017-12-10 DIAGNOSIS — Z87440 Personal history of urinary (tract) infections: Secondary | ICD-10-CM

## 2017-12-10 LAB — CBC WITH DIFFERENTIAL/PLATELET
Abs Immature Granulocytes: 0.04 10*3/uL (ref 0.00–0.07)
Basophils Absolute: 0 10*3/uL (ref 0.0–0.1)
Basophils Relative: 0 %
Eosinophils Absolute: 0.1 10*3/uL (ref 0.0–0.5)
Eosinophils Relative: 1 %
HCT: 30 % — ABNORMAL LOW (ref 39.0–52.0)
Hemoglobin: 9.6 g/dL — ABNORMAL LOW (ref 13.0–17.0)
Immature Granulocytes: 1 %
Lymphocytes Relative: 21 %
Lymphs Abs: 1.4 10*3/uL (ref 0.7–4.0)
MCH: 27.6 pg (ref 26.0–34.0)
MCHC: 32 g/dL (ref 30.0–36.0)
MCV: 86.2 fL (ref 80.0–100.0)
Monocytes Absolute: 0.8 10*3/uL (ref 0.1–1.0)
Monocytes Relative: 12 %
Neutro Abs: 4.5 10*3/uL (ref 1.7–7.7)
Neutrophils Relative %: 65 %
Platelets: 306 10*3/uL (ref 150–400)
RBC: 3.48 MIL/uL — ABNORMAL LOW (ref 4.22–5.81)
RDW: 16 % — ABNORMAL HIGH (ref 11.5–15.5)
WBC: 6.9 10*3/uL (ref 4.0–10.5)
nRBC: 0 % (ref 0.0–0.2)

## 2017-12-10 LAB — COMPREHENSIVE METABOLIC PANEL
ALT: 22 U/L (ref 0–44)
AST: 24 U/L (ref 15–41)
Albumin: 3.3 g/dL — ABNORMAL LOW (ref 3.5–5.0)
Alkaline Phosphatase: 83 U/L (ref 38–126)
Anion gap: 7 (ref 5–15)
BUN: 26 mg/dL — ABNORMAL HIGH (ref 8–23)
CO2: 25 mmol/L (ref 22–32)
Calcium: 9.1 mg/dL (ref 8.9–10.3)
Chloride: 105 mmol/L (ref 98–111)
Creatinine, Ser: 0.95 mg/dL (ref 0.61–1.24)
GFR calc Af Amer: >60 mL/min (ref >60)
GFR calc non Af Amer: >60 mL/min (ref >60)
Glucose, Bld: 141 mg/dL — ABNORMAL HIGH (ref 70–99)
Potassium: 4.2 mmol/L (ref 3.5–5.1)
Sodium: 137 mmol/L (ref 135–145)
Total Bilirubin: 0.4 mg/dL (ref 0.3–1.2)
Total Protein: 7.6 g/dL (ref 6.5–8.1)

## 2017-12-10 MED ORDER — DENOSUMAB 120 MG/1.7ML ~~LOC~~ SOLN: 120.0000 mg | Freq: Once | SUBCUTANEOUS | Status: DC

## 2017-12-10 NOTE — Assessment & Plan Note (Addendum)
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 Lupron [30m q 37m; last 09/12/2017]; On Xtandi; June 24th 2019 CT scan shows significant improvement of prostate/pelvic soft tissue lesions/retroperitoneal lymphadenopathy; and bilateral hydronephrosis.    # Continue [name] 80 mg/day [reduced dose sec to fatigue]; PSA -September 2019-0.08.  Clinically stable.   #Fatigue-secondary to [place].  Stable  #Obstructive uropathy-status post bilateral stenting [re-changed]; stable  # Anemia-hemoglobin 9.2.  STABLE;iron sat- 16%; contine PO iron q OD.   # Shoulder pain right-with movement-? metastatic disease on MRI shoulder. STABLE; plan X-geva today.  #Right eye vision disturbance/pain-defer to ophthalmology.  Stable.  # DISPOSITION:  # treatment today # Follow up in 4 weeks/labs-PSA; X-geva Dr.B  Cc: Dr. Ginette Pitman

## 2017-12-10 NOTE — Progress Notes (Signed)
Patient did not receive Xgeva injection due to family not able to wait today.

## 2017-12-10 NOTE — Progress Notes (Signed)
[place] OFFICE PROGRESS NOTE  Patient Care Team: [name], MD as PCP - General (Internal Medicine)  Cancer Staging No matching staging information was found for the patient.   Oncology History   # FEB 2019-Metastatic prostate cancer/ castrate sensitive [bulky retroperitoneal adenopathy; invasion into the bladder; rectum] Bladder Bx- prostate. On Degarelix  # April 3rd 2019- [name] [143md];  Lupron q 6 M [[id number]]; MID AUG 2019- reduced dose to 87mday  # Poorly controlled DM-on insulin  # s/p right PCN [explanted]/ Foley cath [Dr.Brandon]; multiple UTIs.  ----------------------------------------------------------------- DIAGNOSIS: [ FEB 2019] MET. PROSTATE CA  STAGE:   IV ;GOALS: PALLIATIVE  CURRENT/MOST RECENT THERAPY [ Feb-April2019] Lupron+ [name]      [facility]     INTERVAL HISTORY:  Michael [name].o.  male pleasant patient [name] is here for follow-up.  Patient denies any worsening of his right shoulder pain.  Denies any worsening changes of his vision.  He denies any worsening fatigue.  Continues to take Tylenol as needed for pain.  Review of Systems  Constitutional: Negative for chills, diaphoresis, fever, malaise/fatigue and weight loss.  HENT: Negative for nosebleeds and sore throat.   Eyes: Negative for double vision.  Respiratory: Negative for cough, hemoptysis, sputum production, shortness of breath and wheezing.   Cardiovascular: Negative for chest pain, palpitations, orthopnea and leg swelling.  Gastrointestinal: Negative for abdominal pain, blood in stool, constipation, diarrhea, heartburn, melena, nausea and vomiting.  Genitourinary: Negative for dysuria, frequency and urgency.  Musculoskeletal: Positive for back pain and joint pain.  Skin: Negative.  Negative for itching and rash.  Neurological: Negative for dizziness,  tingling, focal weakness, weakness and headaches.  Endo/Heme/Allergies: Does not bruise/bleed easily.  Psychiatric/Behavioral: Negative for depression. The patient is not nervous/anxious and does not have insomnia.       PAST MEDICAL HISTORY :  Past Medical History:  Diagnosis Date  . Benign prostatic hypertrophy   . Bilateral carotid artery disease (HCC)    Mild plaque formation without obstructive disease noted on carotid Doppler.  . Coronary artery disease    [facility] The patient was told about 2 blockages which did not require revascularization.  . Diabetes mellitus without complication ([facility]  . Essential hypertension   . GERD (gastroesophageal reflux disease)   . Hyperlipidemia   . Hypertension   . Left carotid artery stenosis   . [place] ([facility]    PAST SURGICAL HISTORY :   Past Surgical History:  Procedure Laterality Date  . CARDIAC CATHETERIZATION  2010  . CARDIAC CATHETERIZATION N/A 02/03/2015   Procedure: Left Heart Cath and Coronary Angiography;  Surgeon: MuWellington HampshireMD;  [facility];  Service: Cardiovascular;  Laterality: N/A;  . CATARACT EXTRACTION    . CYSTOSCOPY W/ RETROGRADES Bilateral 04/11/2017   Procedure: CYSTOSCOPY WITH RETROGRADE PYELOGRAM;  Surgeon: [name];  Location: [facility] ORS;  Service: Urology;  Laterality: Bilateral;  . CYSTOSCOPY W/ RETROGRADES Bilateral 09/12/2017   Procedure: CYSTOSCOPY WITH RETROGRADE PYELOGRAM;  Surgeon: [name];  Location: [facility] ORS;  Service: Urology;  Laterality: Bilateral;  . CYSTOSCOPY W/ URETERAL STENT PLACEMENT Bilateral 09/12/2017   Procedure: CYSTOSCOPY WITH STENT REPLACEMENT;  Surgeon: [name];  Location: [facility] ORS;  Service: Urology;  Laterality: Bilateral;  . CYSTOSCOPY WITH STENT PLACEMENT Left 04/11/2017   Procedure: CYSTOSCOPY WITH STENT PLACEMENT and fulgeration;  Surgeon: [name], MD;  Location: [facility]  ORS;  Service: Urology;  Laterality: Left;  . CYSTOSCOPY WITH URETHRAL DILATATION Bilateral 04/11/2017   Procedure: CYSTOSCOPY WITH URETHRAL DILATATION;  Surgeon: [name], MD;  Location: [facility] ORS;  Service: Urology;  Laterality: Bilateral;  . IR CONVERT RIGHT NEPHROSTOMY TO NEPHROURETERAL CATH  05/21/2017  . IR NEPHROSTOMY PLACEMENT RIGHT  04/13/2017  . PROSTATECTOMY      FAMILY HISTORY :   Family History  Problem Relation Age of Onset  . Hypertension Father     SOCIAL HISTORY:   Social History   Tobacco Use  . Smoking status: Never Smoker  . Smokeless tobacco: Never Used  Substance Use Topics  . Alcohol use: No  . Drug use: No    ALLERGIES:  is allergic to metformin.  MEDICATIONS:  Current Outpatient Medications  Medication Sig Dispense Refill  . acetaminophen (TYLENOL) 500 MG tablet Take 500 mg by mouth every 4 (four) hours as needed for moderate pain or fever.    [name] albuterol (PROVENTIL HFA;VENTOLIN HFA) 108 (90 Base) MCG/ACT inhaler Inhale 2 puffs into the lungs every 4 (four) hours as needed for wheezing or shortness of breath.    [name] atorvastatin (LIPITOR) 10 MG tablet Take 10 mg by mouth daily at 6 PM.     . Calcium Carb-Cholecalciferol (CALCIUM 600+D3) 600-800 MG-UNIT TABS Take 1 tablet by mouth daily.    . enzalutamide (XTANDI) 40 MG capsule Take 2 capsules (80 mg total) by mouth daily. 60 capsule 3  . glucose blood (ACCU-CHEK GUIDE) test strip Use asdirected three times a day diag E11.65 100 each 3  . ibuprofen (ADVIL,MOTRIN) 200 MG tablet Take 200 mg by mouth every 6 (six) hours as needed for fever or moderate pain.     [name] insulin glargine (LANTUS) 100 UNIT/ML injection Inject 18 Units into the skin every morning. Based on sugar levels in am.     . insulin lispro (HUMALOG) 100 UNIT/ML injection Inject 8 Units into the skin See admin instructions. Before dinner    . lisinopril (PRINIVIL,ZESTRIL) 2.5 MG tablet TAKE 1 TABLET BY MOUTH EVERY DAY 90 tablet 4  . METFORMIN  HCL PO Take by mouth.    . metoprolol succinate (TOPROL-XL) 25 MG 24 hr tablet TAKE 1 TABLET BY MOUTH EVERY DAY FOR BLOOD PRESSURE 90 tablet 3  . mirabegron ER (MYRBETRIQ) 25 MG TB24 tablet Take 1 tablet (25 mg total) by mouth daily. 30 tablet 11  . Multiple Vitamins-Minerals (MULTIVITAMIN WITH MINERALS) tablet Take 1 tablet by mouth daily.     . nitrofurantoin, macrocrystal-monohydrate, (MACROBID) 100 MG capsule Take 1 capsule (100 mg total) by mouth 2 (two) times daily. 14 capsule 0  . omeprazole (PRILOSEC) 20 MG capsule TAKE ONE CAPSULE BY MOUTH EVERY DAY 90 capsule 2  . megestrol (MEGACE) 40 MG/ML suspension Take 10 mLs by mouth daily.     No current facility-administered medications for this visit.    Facility-Administered Medications Ordered in Other Visits  Medication Dose Route Frequency Provider Last Rate Last Dose  . denosumab (XGEVA) injection 120 mg  120 mg Subcutaneous Once [name], MD        PHYSICAL EXAMINATION: ECOG PERFORMANCE STATUS: 1 - Symptomatic but completely ambulatory  BP 123/71 (BP Location: Left Arm, Patient Position: Sitting)   Pulse 71   Temp (!) 97.5 F (36.4 C) (Tympanic)   Resp 18   Wt 128 lb 4 oz (58.2 kg)   BMI 19.50 kg/m  Filed Weights   12/10/17 0949  Weight: 128 lb 4 oz (58.2 kg)    Physical Exam  Constitutional: He is oriented to person, place, and time and well-developed, well-nourished, and in no distress.  Accompanied by his daughter.  HENT:  Head: Normocephalic and atraumatic.  Mouth/Throat: Oropharynx is clear and moist. No oropharyngeal exudate.  Eyes: Pupils are equal, round, and reactive to light.  Neck: Normal range of motion. Neck supple.  Cardiovascular: Normal rate and regular rhythm.  Pulmonary/Chest: No respiratory distress. He has no wheezes.  Abdominal: Soft. Bowel sounds are normal. He exhibits no distension and no mass. There is no tenderness. There is no rebound and no guarding.  Musculoskeletal: Normal  range of motion. He exhibits no edema or tenderness.  Neurological: He is alert and oriented to person, place, and time.  Skin: Skin is warm.  Psychiatric: Affect normal.       LABORATORY DATA:  I have reviewed the data as listed    Component Value Date/Time   NA 137 12/10/2017 0839   NA 136 05/07/2012 1328   K 4.2 12/10/2017 0839   K 4.5 05/07/2012 1328   CL 105 12/10/2017 0839   CL 104 05/07/2012 1328   CO2 25 12/10/2017 0839   CO2 28 05/07/2012 1328   GLUCOSE 141 (H) 12/10/2017 0839   GLUCOSE 73 05/07/2012 1328   BUN 26 (H) 12/10/2017 0839   BUN 16 05/07/2012 1328   CREATININE 0.95 12/10/2017 0839   CREATININE 0.84 05/07/2012 1328   CALCIUM 9.1 12/10/2017 0839   CALCIUM 8.8 05/07/2012 1328   PROT 7.6 12/10/2017 0839   ALBUMIN 3.3 (L) 12/10/2017 0839   AST 24 12/10/2017 0839   ALT 22 12/10/2017 0839   ALKPHOS 83 12/10/2017 0839   BILITOT 0.4 12/10/2017 0839   GFRNONAA >60 12/10/2017 0839   GFRNONAA >60 05/07/2012 1328   GFRAA >60 12/10/2017 0839   GFRAA >60 05/07/2012 1328    No results found for: SPEP, UPEP  Lab Results  Component Value Date   WBC 6.9 12/10/2017   NEUTROABS 4.5 12/10/2017   HGB 9.6 (L) 12/10/2017   HCT 30.0 (L) 12/10/2017   MCV 86.2 12/10/2017   PLT 306 12/10/2017      Chemistry      Component Value Date/Time   NA 137 12/10/2017 0839   NA 136 05/07/2012 1328   K 4.2 12/10/2017 0839   K 4.5 05/07/2012 1328   CL 105 12/10/2017 0839   CL 104 05/07/2012 1328   CO2 25 12/10/2017 0839   CO2 28 05/07/2012 1328   BUN 26 (H) 12/10/2017 0839   BUN 16 05/07/2012 1328   CREATININE 0.95 12/10/2017 0839   CREATININE 0.84 05/07/2012 1328      Component Value Date/Time   CALCIUM 9.1 12/10/2017 0839   CALCIUM 8.8 05/07/2012 1328   ALKPHOS 83 12/10/2017 0839   AST 24 12/10/2017 0839   ALT 22 12/10/2017 0839   BILITOT 0.4 12/10/2017 0839       RADIOGRAPHIC STUDIES: I have personally reviewed the radiological images as listed and agreed  with the findings in the report. No results found.   ASSESSMENT & PLAN:  Prostate cancer [facility]) # Metastatic prostate cancer/ castrate sensitive [bulky retroperitoneal adenopathy; invasion into the bladder; rectum]. Lupron [29mq 671mlast 09/12/2017]; On Xtandi; June 24th 2019 CT scan shows significant improvement of prostate/pelvic soft tissue lesions/retroperitoneal lymphadenopathy; and bilateral hydronephrosis.    # Continue [name] 80 mg/day [reduced dose sec to  fatigue]; PSA -September 2019-0.08.  Clinically stable.   #Fatigue-secondary to Michael [name].  Stable  #Obstructive uropathy-status post bilateral stenting [re-changed]; stable  # Anemia-hemoglobin 9.2.  STABLE;iron sat- 16%; contine PO iron q OD.   # Shoulder pain right-with movement-? metastatic disease on MRI shoulder. STABLE; plan X-geva today.  #Right eye vision disturbance/pain-defer to ophthalmology.  Stable.  # DISPOSITION:  # treatment today # Follow up in 4 weeks/labs-PSA; X-geva Dr.B  Cc: Dr. Ginette Pitman    No orders of the defined types were placed in this encounter.  All questions were answered. The patient knows to call the clinic with any problems, questions or concerns.      [name], MD 12/10/2017 10:27 AM

## 2017-12-10 NOTE — Progress Notes (Signed)
Pt in for follow up, weight up 7 lbs since 11/12/17.

## 2017-12-12 MED FILL — XTANDI 40 MG CAPSULE: 40 | 30 days supply | Qty: 60 | Fill #2

## 2017-12-17 DIAGNOSIS — E7800 Pure hypercholesterolemia, unspecified: Secondary | ICD-10-CM | POA: Diagnosis not present

## 2017-12-17 DIAGNOSIS — Z125 Encounter for screening for malignant neoplasm of prostate: Secondary | ICD-10-CM | POA: Diagnosis not present

## 2017-12-17 DIAGNOSIS — I10 Essential (primary) hypertension: Secondary | ICD-10-CM | POA: Diagnosis not present

## 2017-12-17 DIAGNOSIS — E1159 Type 2 diabetes mellitus with other circulatory complications: Secondary | ICD-10-CM | POA: Diagnosis not present

## 2017-12-17 DIAGNOSIS — D649 Anemia, unspecified: Secondary | ICD-10-CM | POA: Diagnosis not present

## 2017-12-17 DIAGNOSIS — R8290 Unspecified abnormal findings in urine: Secondary | ICD-10-CM | POA: Diagnosis not present

## 2017-12-17 DIAGNOSIS — E1165 Type 2 diabetes mellitus with hyperglycemia: Secondary | ICD-10-CM | POA: Diagnosis not present

## 2017-12-17 DIAGNOSIS — C61 Malignant neoplasm of prostate: Secondary | ICD-10-CM | POA: Diagnosis not present

## 2017-12-28 DIAGNOSIS — Z23 Encounter for immunization: Secondary | ICD-10-CM | POA: Diagnosis not present

## 2017-12-28 DIAGNOSIS — E1165 Type 2 diabetes mellitus with hyperglycemia: Secondary | ICD-10-CM | POA: Diagnosis not present

## 2017-12-28 DIAGNOSIS — I10 Essential (primary) hypertension: Secondary | ICD-10-CM | POA: Diagnosis not present

## 2017-12-28 DIAGNOSIS — Z794 Long term (current) use of insulin: Secondary | ICD-10-CM | POA: Diagnosis not present

## 2017-12-28 DIAGNOSIS — E7800 Pure hypercholesterolemia, unspecified: Secondary | ICD-10-CM | POA: Diagnosis not present

## 2017-12-28 DIAGNOSIS — D649 Anemia, unspecified: Secondary | ICD-10-CM | POA: Diagnosis not present

## 2017-12-28 DIAGNOSIS — Z0000 Encounter for general adult medical examination without abnormal findings: Secondary | ICD-10-CM | POA: Diagnosis not present

## 2017-12-28 DIAGNOSIS — C61 Malignant neoplasm of prostate: Secondary | ICD-10-CM | POA: Diagnosis not present

## 2017-12-28 DIAGNOSIS — M25511 Pain in right shoulder: Secondary | ICD-10-CM | POA: Diagnosis not present

## 2018-01-01 ENCOUNTER — Other Ambulatory Visit: Payer: Self-pay | Admitting: Internal Medicine

## 2018-01-01 DIAGNOSIS — C61 Malignant neoplasm of prostate: Secondary | ICD-10-CM

## 2018-01-07 ENCOUNTER — Inpatient Hospital Stay (HOSPITAL_BASED_OUTPATIENT_CLINIC_OR_DEPARTMENT_OTHER): Payer: Medicare Other | Admitting: Internal Medicine

## 2018-01-07 ENCOUNTER — Encounter: Payer: Self-pay | Admitting: Internal Medicine

## 2018-01-07 ENCOUNTER — Other Ambulatory Visit: Payer: Self-pay

## 2018-01-07 ENCOUNTER — Inpatient Hospital Stay: Payer: Medicare Other

## 2018-01-07 ENCOUNTER — Inpatient Hospital Stay: Payer: Medicare Other | Attending: Internal Medicine

## 2018-01-07 VITALS — BP 128/72 | HR 79 | Temp 99.1°F | Resp 20

## 2018-01-07 DIAGNOSIS — C7951 Secondary malignant neoplasm of bone: Secondary | ICD-10-CM | POA: Diagnosis not present

## 2018-01-07 DIAGNOSIS — Z79899 Other long term (current) drug therapy: Secondary | ICD-10-CM

## 2018-01-07 DIAGNOSIS — Z87440 Personal history of urinary (tract) infections: Secondary | ICD-10-CM

## 2018-01-07 DIAGNOSIS — I10 Essential (primary) hypertension: Secondary | ICD-10-CM | POA: Insufficient documentation

## 2018-01-07 DIAGNOSIS — D649 Anemia, unspecified: Secondary | ICD-10-CM

## 2018-01-07 DIAGNOSIS — Z191 Hormone sensitive malignancy status: Secondary | ICD-10-CM | POA: Insufficient documentation

## 2018-01-07 DIAGNOSIS — E785 Hyperlipidemia, unspecified: Secondary | ICD-10-CM | POA: Diagnosis not present

## 2018-01-07 DIAGNOSIS — C61 Malignant neoplasm of prostate: Secondary | ICD-10-CM

## 2018-01-07 DIAGNOSIS — I2510 Atherosclerotic heart disease of native coronary artery without angina pectoris: Secondary | ICD-10-CM | POA: Diagnosis not present

## 2018-01-07 DIAGNOSIS — Z794 Long term (current) use of insulin: Secondary | ICD-10-CM | POA: Diagnosis not present

## 2018-01-07 DIAGNOSIS — M2550 Pain in unspecified joint: Secondary | ICD-10-CM | POA: Insufficient documentation

## 2018-01-07 DIAGNOSIS — M549 Dorsalgia, unspecified: Secondary | ICD-10-CM | POA: Insufficient documentation

## 2018-01-07 DIAGNOSIS — I6522 Occlusion and stenosis of left carotid artery: Secondary | ICD-10-CM | POA: Diagnosis not present

## 2018-01-07 DIAGNOSIS — K219 Gastro-esophageal reflux disease without esophagitis: Secondary | ICD-10-CM

## 2018-01-07 DIAGNOSIS — E1165 Type 2 diabetes mellitus with hyperglycemia: Secondary | ICD-10-CM | POA: Diagnosis not present

## 2018-01-07 DIAGNOSIS — R599 Enlarged lymph nodes, unspecified: Secondary | ICD-10-CM | POA: Insufficient documentation

## 2018-01-07 LAB — COMPREHENSIVE METABOLIC PANEL
ALT: 14 U/L (ref 0–44)
AST: 22 U/L (ref 15–41)
Albumin: 3.5 g/dL (ref 3.5–5.0)
Alkaline Phosphatase: 87 U/L (ref 38–126)
Anion gap: 9 (ref 5–15)
BUN: 28 mg/dL — ABNORMAL HIGH (ref 8–23)
CO2: 25 mmol/L (ref 22–32)
Calcium: 9.1 mg/dL (ref 8.9–10.3)
Chloride: 103 mmol/L (ref 98–111)
Creatinine, Ser: 1.11 mg/dL (ref 0.61–1.24)
GFR calc Af Amer: >60 mL/min (ref >60)
GFR calc non Af Amer: 60 mL/min — ABNORMAL LOW (ref >60)
Glucose, Bld: 110 mg/dL — ABNORMAL HIGH (ref 70–99)
Potassium: 3.9 mmol/L (ref 3.5–5.1)
Sodium: 137 mmol/L (ref 135–145)
Total Bilirubin: 0.4 mg/dL (ref 0.3–1.2)
Total Protein: 7.8 g/dL (ref 6.5–8.1)

## 2018-01-07 LAB — CBC WITH DIFFERENTIAL/PLATELET
Abs Immature Granulocytes: 0.03 10*3/uL (ref 0.00–0.07)
Basophils Absolute: 0 10*3/uL (ref 0.0–0.1)
Basophils Relative: 1 %
Eosinophils Absolute: 0 10*3/uL (ref 0.0–0.5)
Eosinophils Relative: 1 %
HCT: 31.4 % — ABNORMAL LOW (ref 39.0–52.0)
Hemoglobin: 9.9 g/dL — ABNORMAL LOW (ref 13.0–17.0)
Immature Granulocytes: 1 %
Lymphocytes Relative: 26 %
Lymphs Abs: 1.5 10*3/uL (ref 0.7–4.0)
MCH: 27.3 pg (ref 26.0–34.0)
MCHC: 31.5 g/dL (ref 30.0–36.0)
MCV: 86.7 fL (ref 80.0–100.0)
Monocytes Absolute: 0.9 10*3/uL (ref 0.1–1.0)
Monocytes Relative: 15 %
Neutro Abs: 3.4 10*3/uL (ref 1.7–7.7)
Neutrophils Relative %: 56 %
Platelets: 288 10*3/uL (ref 150–400)
RBC: 3.62 MIL/uL — ABNORMAL LOW (ref 4.22–5.81)
RDW: 14.6 % (ref 11.5–15.5)
WBC: 5.9 10*3/uL (ref 4.0–10.5)
nRBC: 0 % (ref 0.0–0.2)

## 2018-01-07 LAB — IRON AND TIBC
Iron: 34 ug/dL — ABNORMAL LOW (ref 45–182)
Saturation Ratios: 9 % — ABNORMAL LOW (ref 17.9–39.5)
TIBC: 398 ug/dL (ref 250–450)
UIBC: 364 ug/dL

## 2018-01-07 LAB — FERRITIN: Ferritin: 14 ng/mL — ABNORMAL LOW (ref 24–336)

## 2018-01-07 MED ORDER — DENOSUMAB 120 MG/1.7ML ~~LOC~~ SOLN
120.0000 mg | Freq: Once | SUBCUTANEOUS | Status: AC
  Administered 2018-01-07: 120 mg via SUBCUTANEOUS
  Filled 2018-01-07: qty 1.7

## 2018-01-07 NOTE — Assessment & Plan Note (Addendum)
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 Lupron [75m q 41m; last 09/12/2017]; On Xtandi; June 24th 2019 CT scan shows significant improvement of prostate/pelvic soft tissue lesions/retroperitoneal lymphadenopathy; and bilateral hydronephrosis.    # Continue [name] 80 mg/day [reduced dose sec to fatigue]; PSA -Oct 2019-0.04 [[facility]] clinically stable.  #Fatigue-secondary to [place].  Stable.  #Castrate sensitive prostate cancer mets to bone-Xgeva every 6 months.  #Obstructive uropathy-status post bilateral stenting [re-changed]; stable  # Anemia-hemoglobin 9.6.  STABLE;on PO iron.  Await iron studies from today.  #Right eye vision disturbance/pain-defer to ophthalmology.  Stable  # DISPOSITION:  # treatment today # Follow up in 4 weeks/labs-PSA; Lupron Dr.B  Addendum iron studies: Suggestive of iron deficiency saturation 9% ferritin 14.  P.o. iron as discussed above.  Cc: Dr. Ginette Pitman

## 2018-01-07 NOTE — Progress Notes (Signed)
[place] OFFICE PROGRESS NOTE  Patient Care Team: [name], MD as PCP - General (Internal Medicine)  Cancer Staging No matching staging information was found for the patient.   Oncology History   # FEB 2019-Metastatic prostate cancer/ castrate sensitive [bulky retroperitoneal adenopathy; invasion into the bladder; rectum] Bladder Bx- prostate. On Degarelix  # April 3rd 2019- [name] [158md];  Lupron q 6 M [[id number]]; MID AUG 2019- reduced dose to 850mday  # Poorly controlled DM-on insulin  # s/p right PCN [explanted]/ Foley cath [Dr.Brandon]; multiple UTIs.  ----------------------------------------------------------------- DIAGNOSIS: [ FEB 2019] MET. PROSTATE CA  STAGE:   IV ;GOALS: PALLIATIVE  CURRENT/MOST RECENT THERAPY [ Feb-April2019] Lupron+ [name]      [facility]     INTERVAL HISTORY:  GoDAKOTAH ORREGO89.o.  male pleasant patient [name] is here for follow-up.  Patient appetite is good.  Denies any weight loss.  Denies any nausea vomiting.  Denies any worsening vision changes.  Chronic mild back pain/joint pains.  Review of Systems  Constitutional: Negative for chills, diaphoresis, fever, malaise/fatigue and weight loss.  HENT: Negative for nosebleeds and sore throat.   Eyes: Negative for double vision.  Respiratory: Negative for cough, hemoptysis, sputum production, shortness of breath and wheezing.   Cardiovascular: Negative for chest pain, palpitations, orthopnea and leg swelling.  Gastrointestinal: Negative for abdominal pain, blood in stool, constipation, diarrhea, heartburn, melena, nausea and vomiting.  Genitourinary: Negative for dysuria, frequency and urgency.  Musculoskeletal: Positive for back pain and joint pain.  Skin: Negative.  Negative for itching and rash.  Neurological: Negative for dizziness, tingling, focal weakness,  weakness and headaches.  Endo/Heme/Allergies: Does not bruise/bleed easily.  Psychiatric/Behavioral: Negative for depression. The patient is not nervous/anxious and does not have insomnia.       PAST MEDICAL HISTORY :  Past Medical History:  Diagnosis Date  . Benign prostatic hypertrophy   . Bilateral carotid artery disease (HCC)    Mild plaque formation without obstructive disease noted on carotid Doppler.  . Coronary artery disease    [facility] The patient was told about 2 blockages which did not require revascularization.  . Diabetes mellitus without complication ([place]  . Essential hypertension   . GERD (gastroesophageal reflux disease)   . Hyperlipidemia   . Hypertension   . Left carotid artery stenosis   . Prostate cancer ([facility]    PAST SURGICAL HISTORY :   Past Surgical History:  Procedure Laterality Date  . CARDIAC CATHETERIZATION  2010  . CARDIAC CATHETERIZATION N/A 02/03/2015   Procedure: Left Heart Cath and Coronary Angiography;  Surgeon: MuWellington HampshireMD;  [facility];  Service: Cardiovascular;  Laterality: N/A;  . CATARACT EXTRACTION    . CYSTOSCOPY W/ RETROGRADES Bilateral 04/11/2017   Procedure: CYSTOSCOPY WITH RETROGRADE PYELOGRAM;  Surgeon: [name];  Location: [facility] ORS;  Service: Urology;  Laterality: Bilateral;  . CYSTOSCOPY W/ RETROGRADES Bilateral 09/12/2017   Procedure: CYSTOSCOPY WITH RETROGRADE PYELOGRAM;  Surgeon: [name];  Location: [facility] ORS;  Service: Urology;  Laterality: Bilateral;  . CYSTOSCOPY W/ URETERAL STENT PLACEMENT Bilateral 09/12/2017   Procedure: CYSTOSCOPY WITH STENT REPLACEMENT;  Surgeon: [name];  Location: [facility] ORS;  Service: Urology;  Laterality: Bilateral;  . CYSTOSCOPY WITH STENT PLACEMENT Left 04/11/2017   Procedure: CYSTOSCOPY WITH STENT PLACEMENT and fulgeration;  Surgeon: [name];  Location: [facility] ORS;  Service: Urology;   Laterality: Left;  . CYSTOSCOPY WITH URETHRAL DILATATION Bilateral 04/11/2017   Procedure: CYSTOSCOPY WITH URETHRAL DILATATION;  Surgeon: [name], MD;  Location: [facility] ORS;  Service: Urology;  Laterality: Bilateral;  . IR CONVERT RIGHT NEPHROSTOMY TO NEPHROURETERAL CATH  05/21/2017  . IR NEPHROSTOMY PLACEMENT RIGHT  04/13/2017  . PROSTATECTOMY      FAMILY HISTORY :   Family History  Problem Relation Age of Onset  . Hypertension Father     SOCIAL HISTORY:   Social History   Tobacco Use  . Smoking status: Never Smoker  . Smokeless tobacco: Never Used  Substance Use Topics  . Alcohol use: No  . Drug use: No    ALLERGIES:  is allergic to codeine; metformin; and penicillin g.  MEDICATIONS:  Current Outpatient Medications  Medication Sig Dispense Refill  . acetaminophen (TYLENOL) 500 MG tablet Take 500 mg by mouth every 4 (four) hours as needed for moderate pain or fever.    [name] albuterol (PROVENTIL HFA;VENTOLIN HFA) 108 (90 Base) MCG/ACT inhaler Inhale 2 puffs into the lungs every 4 (four) hours as needed for wheezing or shortness of breath.    [name] atorvastatin (LIPITOR) 10 MG tablet Take 10 mg by mouth daily at 6 PM.     . Calcium Carb-Cholecalciferol (CALCIUM 600+D3) 600-800 MG-UNIT TABS Take 1 tablet by mouth daily.    . enzalutamide (XTANDI) 40 MG capsule Take 2 capsules (80 mg total) by mouth daily. 60 capsule 3  . glucose blood (ACCU-CHEK GUIDE) test strip Use asdirected three times a day diag E11.65 100 each 3  . ibuprofen (ADVIL,MOTRIN) 200 MG tablet Take 200 mg by mouth every 6 (six) hours as needed for fever or moderate pain.     [name] insulin glargine (LANTUS) 100 UNIT/ML injection Inject 16 Units into the skin every morning. Based on sugar levels in am.     . iron polysaccharides (NIFEREX) 150 MG capsule Take 1 capsule by mouth daily.    [name] lisinopril (PRINIVIL,ZESTRIL) 2.5 MG tablet TAKE 1 TABLET BY MOUTH EVERY DAY 90 tablet 4  . metFORMIN (GLUCOPHAGE) 1000 MG tablet Take 1  tablet by mouth daily.     . metoprolol succinate (TOPROL-XL) 25 MG 24 hr tablet TAKE 1 TABLET BY MOUTH EVERY DAY FOR BLOOD PRESSURE 90 tablet 3  . mirabegron ER (MYRBETRIQ) 25 MG TB24 tablet Take 1 tablet (25 mg total) by mouth daily. 30 tablet 11  . Multiple Vitamins-Minerals (MULTIVITAMIN WITH MINERALS) tablet Take 1 tablet by mouth daily.     [name] omeprazole (PRILOSEC) 20 MG capsule TAKE ONE CAPSULE BY MOUTH EVERY DAY 90 capsule 2   No current facility-administered medications for this visit.     PHYSICAL EXAMINATION: ECOG PERFORMANCE STATUS: 1 - Symptomatic but completely ambulatory  BP 128/72   Pulse 79   Temp 99.1 F (37.3 C) (Oral)   Resp 20   There were no vitals filed for this visit.  Physical Exam  Constitutional: He is oriented to person, place, and time and well-developed, well-nourished, and in no distress.  Accompanied by his daughter.  HENT:  Head: Normocephalic and atraumatic.  Mouth/Throat: Oropharynx is clear and moist. No oropharyngeal exudate.  Eyes: Pupils are equal, round, and reactive to light.  Neck: Normal range of motion. Neck supple.  Cardiovascular: Normal rate and regular rhythm.  Pulmonary/Chest: No respiratory distress. He has no wheezes.  Abdominal: Soft. Bowel sounds are normal. He exhibits no distension and no mass.  There is no tenderness. There is no rebound and no guarding.  Musculoskeletal: Normal range of motion. He exhibits no edema or tenderness.  Neurological: He is alert and oriented to person, place, and time.  Skin: Skin is warm.  Psychiatric: Affect normal.       LABORATORY DATA:  I have reviewed the data as listed    Component Value Date/Time   NA 137 01/07/2018 0903   NA 136 05/07/2012 1328   K 3.9 01/07/2018 0903   K 4.5 05/07/2012 1328   CL 103 01/07/2018 0903   CL 104 05/07/2012 1328   CO2 25 01/07/2018 0903   CO2 28 05/07/2012 1328   GLUCOSE 110 (H) 01/07/2018 0903   GLUCOSE 73 05/07/2012 1328   BUN 28 (H)  01/07/2018 0903   BUN 16 05/07/2012 1328   CREATININE 1.11 01/07/2018 0903   CREATININE 0.84 05/07/2012 1328   CALCIUM 9.1 01/07/2018 0903   CALCIUM 8.8 05/07/2012 1328   PROT 7.8 01/07/2018 0903   ALBUMIN 3.5 01/07/2018 0903   AST 22 01/07/2018 0903   ALT 14 01/07/2018 0903   ALKPHOS 87 01/07/2018 0903   BILITOT 0.4 01/07/2018 0903   GFRNONAA 60 (L) 01/07/2018 0903   GFRNONAA >60 05/07/2012 1328   GFRAA >60 01/07/2018 0903   GFRAA >60 05/07/2012 1328    No results found for: SPEP, UPEP  Lab Results  Component Value Date   WBC 5.9 01/07/2018   NEUTROABS 3.4 01/07/2018   HGB 9.9 (L) 01/07/2018   HCT 31.4 (L) 01/07/2018   MCV 86.7 01/07/2018   PLT 288 01/07/2018      Chemistry      Component Value Date/Time   NA 137 01/07/2018 0903   NA 136 05/07/2012 1328   K 3.9 01/07/2018 0903   K 4.5 05/07/2012 1328   CL 103 01/07/2018 0903   CL 104 05/07/2012 1328   CO2 25 01/07/2018 0903   CO2 28 05/07/2012 1328   BUN 28 (H) 01/07/2018 0903   BUN 16 05/07/2012 1328   CREATININE 1.11 01/07/2018 0903   CREATININE 0.84 05/07/2012 1328      Component Value Date/Time   CALCIUM 9.1 01/07/2018 0903   CALCIUM 8.8 05/07/2012 1328   ALKPHOS 87 01/07/2018 0903   AST 22 01/07/2018 0903   ALT 14 01/07/2018 0903   BILITOT 0.4 01/07/2018 0903       RADIOGRAPHIC STUDIES: I have personally reviewed the radiological images as listed and agreed with the findings in the report. No results found.   ASSESSMENT & PLAN:  Prostate cancer [facility]) # Metastatic prostate cancer/ castrate sensitive [bulky retroperitoneal adenopathy; invasion into the bladder; rectum]. Lupron [5mq 688mlast 09/12/2017]; On Xtandi; June 24th 2019 CT scan shows significant improvement of prostate/pelvic soft tissue lesions/retroperitoneal lymphadenopathy; and bilateral hydronephrosis.    # Continue [name] 80 mg/day [reduced dose sec to fatigue]; PSA -Oct 2019-0.04 [[facility]] clinically stable.  #Fatigue-secondary to  [place] Stable.  #Castrate sensitive prostate cancer mets to bone-Xgeva every 6 months.  #Obstructive uropathy-status post bilateral stenting [re-changed]; stable  # Anemia-hemoglobin 9.6.  STABLE;on PO iron.  Await iron studies from today.  #Right eye vision disturbance/pain-defer to ophthalmology.  Stable  # DISPOSITION:  # treatment today # Follow up in 4 weeks/labs-PSA; Lupron Dr.B  Addendum iron studies: Suggestive of iron deficiency saturation 9% ferritin 14.  P.o. iron as discussed above.  Cc: Dr. HaGinette Pitman No orders of the defined types were placed in this encounter.  All questions were answered. The patient knows to call the clinic with any problems, questions or concerns.      [name], MD 01/07/2018 12:54 PM

## 2018-01-09 MED FILL — XTANDI 40 MG CAPSULE: 40 | 30 days supply | Qty: 60 | Fill #3

## 2018-01-11 DIAGNOSIS — H04223 Epiphora due to insufficient drainage, bilateral lacrimal glands: Secondary | ICD-10-CM | POA: Diagnosis not present

## 2018-01-21 DIAGNOSIS — E1159 Type 2 diabetes mellitus with other circulatory complications: Secondary | ICD-10-CM | POA: Diagnosis not present

## 2018-01-29 ENCOUNTER — Telehealth: Payer: Self-pay | Admitting: Radiology

## 2018-01-29 ENCOUNTER — Other Ambulatory Visit: Payer: Self-pay | Admitting: Radiology

## 2018-01-29 ENCOUNTER — Encounter: Payer: Self-pay | Admitting: Urology

## 2018-01-29 ENCOUNTER — Ambulatory Visit (INDEPENDENT_AMBULATORY_CARE_PROVIDER_SITE_OTHER): Payer: Medicare Other | Admitting: Urology

## 2018-01-29 VITALS — BP 138/78 | HR 92 | Ht 68.0 in | Wt 131.0 lb

## 2018-01-29 DIAGNOSIS — R8271 Bacteriuria: Secondary | ICD-10-CM

## 2018-01-29 DIAGNOSIS — I2510 Atherosclerotic heart disease of native coronary artery without angina pectoris: Secondary | ICD-10-CM

## 2018-01-29 DIAGNOSIS — C61 Malignant neoplasm of prostate: Secondary | ICD-10-CM

## 2018-01-29 DIAGNOSIS — N320 Bladder-neck obstruction: Secondary | ICD-10-CM | POA: Diagnosis not present

## 2018-01-29 DIAGNOSIS — N1330 Unspecified hydronephrosis: Secondary | ICD-10-CM | POA: Diagnosis not present

## 2018-01-29 DIAGNOSIS — R300 Dysuria: Secondary | ICD-10-CM | POA: Diagnosis not present

## 2018-01-29 NOTE — Telephone Encounter (Signed)
Patient was given the [facility] Information form below as well as the Instructions for Pre-Admission Testing form & a map of [facility].   [place], [place], [place] [address] Telephone: [phone] Fax: [phone]   Thank you for choosing [facility] for your upcoming surgery!  We are always here to assist in your urological needs.  Please read the following information with specific details for your upcoming appointments related to your surgery. Please contact Ethin Drummond at [phone] Option 3 with any questions.  The Name of Your Surgery: Cystoscopy, bilateral retrograde pyelogram, bilateral stent removal vs. exchange Your Surgery Date: 02/25/2018 Your Surgeon: [name]  Please call Same Day Surgery at [phone] between the hours of 1pm-3pm one day prior to your surgery. They will inform you of the time to arrive at Same Day Surgery which is located on the second floor of the [facility].   Please refer to the attached letter regarding instructions for Pre-Admission Testing. You will receive a call from the [place] office regarding your appointment with them.  The Pre-Admission Testing office is located at [place], on the first floor of the [place] [facility] in [place] (office is to the right as you enter through the [employer] of the [facility]). Please have all medications you are currently taking and your insurance card available.   Patient was advised to have nothing to eat or drink after midnight the night prior to surgery except that he may have only water until 2 hours before surgery with nothing to drink within 2 hours of surgery.  The patient states he currently takes no blood thinners. Patient's questions were answered and he expressed understanding of these instructions.

## 2018-01-29 NOTE — Progress Notes (Signed)
01/29/2018 3:17 PM   Michael [name] [birth year] [id number]  Referring provider: [name], [place] [facility] [place], [place] [address]  Chief Complaint  Patient presents with  . Dysuria    urinary odor    HPI: 84 year old male with advanced metastatic (castrate sensitive) prostate cancer managed by Dr. per Monday currently on ADT/Xtandi with recent dose reduction and Xgeva he returns to the office to discuss his ongoing urinary symptoms.    He was initially seen and evaluated during inpatient hospital admission in 03/2017 with urinary retention, acute kidney injury with bilateral hydronephrosis. He underwent biopsy of his bladder neck revealing prostate cancer and started on ADT. He required surgical intervention for Foley catheter placement given severe bladder neck contracture at which time a left ureteral stent was placed and ultimately a right PCN.  Right PCN was later converted to indwelling double-J stent.  His Foley was subsequently removed and he self cathing several times a week to keep his bladder neck open (low PVRs).  More recently, he returned to the operating room on 09/12/2017 for bilateral retrograde pyelogram and ultimately stent exchange due to poor excretion of contrast material the time of retrogrades.  Terms of cancer control, his most recent PSA was 0.08 on 10/2017 be at his PSA is a relatively poor biomarker of the extent of his disease (9 at the time of diagnosis).  Most recent imaging in the form of CT abdomen pelvis with contrast on 07/2017 shows response to ADT with near complete resolution of metastatic abdominal and pelvic lymphadenopathy.  He is taking Myrbetriq 25 mg for urinary frequency including nocturia.  This is helping somewhat but he continues to get up at least every 2 hours and a night to void.  His daughter reports a foul-smelling urine.  He denies any dysuria or gross hematuria.  No fevers or chills.  No recent  urinary tract infections.  He does have a personal history of ESBL E. Coli.  He is gaining weight and feeling relatively well.   PMH: Past Medical History:  Diagnosis Date  . Benign prostatic hypertrophy   . Bilateral carotid artery disease (HCC)    Mild plaque formation without obstructive disease noted on carotid Doppler.  . Coronary artery disease    Previous cardiac catheterization at [facility] in 2010. The patient was told about 2 blockages which did not require revascularization.  . Diabetes mellitus without complication ([place])   . Essential hypertension   . GERD (gastroesophageal reflux disease)   . Hyperlipidemia   . Hypertension   . Left carotid artery stenosis   . Prostate cancer ([place]) 03/2017    Surgical History: Past Surgical History:  Procedure Laterality Date  . CARDIAC CATHETERIZATION  2010  . CARDIAC CATHETERIZATION N/A 02/03/2015   Procedure: Left Heart Cath and Coronary Angiography;  Surgeon: Wellington [name], MD;  Location: [facility] CV [facility];  Service: Cardiovascular;  Laterality: N/A;  . CATARACT EXTRACTION    . CYSTOSCOPY W/ RETROGRADES Bilateral 04/11/2017   Procedure: CYSTOSCOPY WITH RETROGRADE PYELOGRAM;  Surgeon: [name], MD;  Location: [facility] ORS;  Service: Urology;  Laterality: Bilateral;  . CYSTOSCOPY W/ RETROGRADES Bilateral 09/12/2017   Procedure: CYSTOSCOPY WITH RETROGRADE PYELOGRAM;  Surgeon: [name], MD;  Location: [facility] ORS;  Service: Urology;  Laterality: Bilateral;  . CYSTOSCOPY W/ URETERAL STENT PLACEMENT Bilateral 09/12/2017   Procedure: CYSTOSCOPY WITH STENT REPLACEMENT;  Surgeon: [name], MD;  Location: [facility] ORS;  Service: Urology;  Laterality: Bilateral;  .  CYSTOSCOPY WITH STENT PLACEMENT Left 04/11/2017   Procedure: CYSTOSCOPY WITH STENT PLACEMENT and fulgeration;  Surgeon: [name], MD;  Location: [facility] ORS;  Service: Urology;  Laterality: Left;  . CYSTOSCOPY WITH URETHRAL DILATATION Bilateral 04/11/2017   Procedure:  CYSTOSCOPY WITH URETHRAL DILATATION;  Surgeon: [name], MD;  Location: [facility] ORS;  Service: Urology;  Laterality: Bilateral;  . IR CONVERT RIGHT NEPHROSTOMY TO NEPHROURETERAL CATH  05/21/2017  . IR NEPHROSTOMY PLACEMENT RIGHT  04/13/2017  . PROSTATECTOMY      Home Medications:  Allergies as of 01/29/2018      Reactions   Codeine Nausea And Vomiting   Metformin Nausea And Vomiting   Penicillin G Nausea And Vomiting      Medication List        Accurate as of 01/29/18  3:17 PM. Always use your most recent med list.          acetaminophen 500 MG tablet Commonly known as:  TYLENOL Take 500 mg by mouth every 4 (four) hours as needed for moderate pain or fever.   albuterol 108 (90 Base) MCG/ACT inhaler Commonly known as:  PROVENTIL HFA;VENTOLIN HFA Inhale 2 puffs into the lungs every 4 (four) hours as needed for wheezing or shortness of breath.   atorvastatin 10 MG tablet Commonly known as:  LIPITOR Take 10 mg by mouth daily at 6 PM.   CALCIUM 600+D3 600-800 MG-UNIT Tabs Generic drug:  Calcium Carb-Cholecalciferol Take 1 tablet by mouth daily.   enzalutamide 40 MG capsule Commonly known as:  XTANDI Take 2 capsules (80 mg total) by mouth daily.   glucose blood test strip Use asdirected three times a day diag E11.65   ibuprofen 200 MG tablet Commonly known as:  ADVIL,MOTRIN Take 200 mg by mouth every 6 (six) hours as needed for fever or moderate pain.   insulin glargine 100 UNIT/ML injection Commonly known as:  LANTUS Inject 16 Units into the skin every morning. Based on sugar levels in am.   iron polysaccharides 150 MG capsule Commonly known as:  NIFEREX Take 1 capsule by mouth daily.   lisinopril 2.5 MG tablet Commonly known as:  PRINIVIL,ZESTRIL TAKE 1 TABLET BY MOUTH EVERY DAY   metFORMIN 1000 MG tablet Commonly known as:  GLUCOPHAGE Take 1 tablet by mouth daily.   metoprolol succinate 25 MG 24 hr tablet Commonly known as:  TOPROL-XL TAKE 1 TABLET BY  MOUTH EVERY DAY FOR BLOOD PRESSURE   mirabegron ER 25 MG Tb24 tablet Commonly known as:  MYRBETRIQ Take 1 tablet (25 mg total) by mouth daily.   multivitamin with minerals tablet Take 1 tablet by mouth daily.   omeprazole 20 MG capsule Commonly known as:  PRILOSEC TAKE ONE CAPSULE BY MOUTH EVERY DAY       Allergies:  Allergies  Allergen Reactions  . Codeine Nausea And Vomiting  . Metformin Nausea And Vomiting  . Penicillin G Nausea And Vomiting    Family History: Family History  Problem Relation Age of Onset  . Hypertension Father     Social History:  reports that he has never smoked. He has never used smokeless tobacco. He reports that he does not drink alcohol or use drugs.  ROS: UROLOGY Frequent Urination?: Yes Hard to postpone urination?: No Burning/pain with urination?: Yes Get up at night to urinate?: No Leakage of urine?: No Urine stream starts and stops?: No Trouble starting stream?: No Do you have to strain to urinate?: No Blood in urine?: No Urinary tract infection?: No  Sexually transmitted disease?: No Injury to kidneys or bladder?: No Painful intercourse?: No Weak stream?: No Erection problems?: No Penile pain?: No  Gastrointestinal Nausea?: No Vomiting?: No Indigestion/heartburn?: No Diarrhea?: No Constipation?: No  Constitutional Fever: No Night sweats?: No Weight loss?: No Fatigue?: Yes  Skin Skin rash/lesions?: No Itching?: No  Eyes Blurred vision?: Yes Double vision?: No  Ears/Nose/Throat Sore throat?: No Sinus problems?: No  Hematologic/Lymphatic Swollen glands?: No Easy bruising?: No  Cardiovascular Leg swelling?: No Chest pain?: No  Respiratory Cough?: No Shortness of breath?: No  Endocrine Excessive thirst?: No  Musculoskeletal Back pain?: No Joint pain?: No  Neurological Headaches?: No Dizziness?: No  Psychologic Depression?: No Anxiety?: No  Physical Exam: BP 138/78   Pulse 92   Ht 5\' 8"   (1.727 m)   Wt 131 lb (59.4 kg)   BMI 19.92 kg/m   Constitutional:  Alert and oriented, No acute distress.  Accompanied by daughter today. HEENT: [place] AT, moist mucus membranes.  Trachea midline, no masses. Cardiovascular: No clubbing, cyanosis, or edema. Respiratory: Normal respiratory effort, no increased work of breathing. GI: Abdomen is soft, nontender, nondistended, no abdominal masses GU: No CVA tenderness Skin: No rashes, bruises or suspicious lesions. Neurologic: Grossly intact, no focal deficits, moving all 4 extremities. Psychiatric: Normal mood and affect.  Laboratory Data: Lab Results  Component Value Date   WBC 5.9 01/07/2018   HGB 9.9 (L) 01/07/2018   HCT 31.4 (L) 01/07/2018   MCV 86.7 01/07/2018   PLT 288 01/07/2018    Lab Results  Component Value Date   CREATININE 1.11 01/07/2018    Lab Results  Component Value Date   HGBA1C 9.3 04/05/2017    Urinalysis UA grossly positive, urine culture pending  Pertinent Imaging: No new recent interval imaging  Assessment & Plan:    1. Prostate cancer [facility]) Metastatic castrate sensitive prostate cancer Managed by Dr. Oneita Jolly at the cancer center on Lupron/Xtandi/Xgeva  - Urinalysis, Complete - CULTURE, URINE COMPREHENSIVE  2. Bilateral hydronephrosis Managed with bilateral indwelling stents Due for stent exchange We discussed retrograde pyelogram at the time of stent exchange with consideration of removing stents based on drainage, previously had sluggish drainage bilaterally at last stent exchange Discussed risk of this in detail including risk of bleeding, infection, damage surrounding structures, need for stent replacement amongst others All questions answered Preop urine culture today Directed antibiotics based on chronic colonization, may treat preoperatively depending on urine culture data  3. Bladder neck contracture Intermittently cathing to keep bladder neck open, voiding spontaneously well  4.  Chronic bacteriuria As above, preop precautions  Schedule surgery as above  [name], MD  [facility] [address] [place], [place] [address] [phone]

## 2018-01-29 NOTE — H&P (View-Only) (Signed)
01/29/2018 3:17 PM   Michael [name] 02/12/1934 [id number]  Referring provider: [name], [place] [facility] [place],  [address]  Chief Complaint  Patient presents with  . Dysuria    urinary odor    HPI: 84 year old male with advanced metastatic (castrate sensitive) prostate cancer managed by Dr. per Monday currently on ADT/Xtandi with recent dose reduction and Xgeva he returns to the office to discuss his ongoing urinary symptoms.    He was initially seen and evaluated during inpatient hospital admission in 03/2017 with urinary retention, acute kidney injury with bilateral hydronephrosis. He underwent biopsy of his bladder neck revealing prostate cancer and started on ADT. He required surgical intervention for Foley catheter placement given severe bladder neck contracture at which time a left ureteral stent was placed and ultimately a right PCN.  Right PCN was later converted to indwelling double-J stent.  His Foley was subsequently removed and he self cathing several times a week to keep his bladder neck open (low PVRs).  More recently, he returned to the operating room on 09/12/2017 for bilateral retrograde pyelogram and ultimately stent exchange due to poor excretion of contrast material the time of retrogrades.  Terms of cancer control, his most recent PSA was 0.08 on 10/2017 be at his PSA is a relatively poor biomarker of the extent of his disease (9 at the time of diagnosis).  Most recent imaging in the form of CT abdomen pelvis with contrast on 07/2017 shows response to ADT with near complete resolution of metastatic abdominal and pelvic lymphadenopathy.  He is taking Myrbetriq 25 mg for urinary frequency including nocturia.  This is helping somewhat but he continues to get up at least every 2 hours and a night to void.  His daughter reports a foul-smelling urine.  He denies any dysuria or gross hematuria.  No fevers or chills.  No recent  urinary tract infections.  He does have a personal history of ESBL E. Coli.  He is gaining weight and feeling relatively well.   PMH: Past Medical History:  Diagnosis Date  . Benign prostatic hypertrophy   . Bilateral carotid artery disease (HCC)    Mild plaque formation without obstructive disease noted on carotid Doppler.  . Coronary artery disease    Previous cardiac catheterization at [facility] in 2010. The patient was told about 2 blockages which did not require revascularization.  . Diabetes mellitus without complication ([place])   . Essential hypertension   . GERD (gastroesophageal reflux disease)   . Hyperlipidemia   . Hypertension   . Left carotid artery stenosis   . Prostate cancer ([place]) 03/2017    Surgical History: Past Surgical History:  Procedure Laterality Date  . CARDIAC CATHETERIZATION  2010  . CARDIAC CATHETERIZATION N/A 02/03/2015   Procedure: Left Heart Cath and Coronary Angiography;  Surgeon: Wellington [name], MD;  Location: [place];  Service: Cardiovascular;  Laterality: N/A;  . CATARACT EXTRACTION    . CYSTOSCOPY W/ RETROGRADES Bilateral 04/11/2017   Procedure: CYSTOSCOPY WITH RETROGRADE PYELOGRAM;  Surgeon: [name], MD;  Location: [facility] ORS;  Service: Urology;  Laterality: Bilateral;  . CYSTOSCOPY W/ RETROGRADES Bilateral 09/12/2017   Procedure: CYSTOSCOPY WITH RETROGRADE PYELOGRAM;  Surgeon: [name], MD;  Location: [facility] ORS;  Service: Urology;  Laterality: Bilateral;  . CYSTOSCOPY W/ URETERAL STENT PLACEMENT Bilateral 09/12/2017   Procedure: CYSTOSCOPY WITH STENT REPLACEMENT;  Surgeon: [name], MD;  Location: [facility] ORS;  Service: Urology;  Laterality: Bilateral;  .  CYSTOSCOPY WITH STENT PLACEMENT Left 04/11/2017   Procedure: CYSTOSCOPY WITH STENT PLACEMENT and fulgeration;  Surgeon: [name], MD;  Location: [facility] ORS;  Service: Urology;  Laterality: Left;  . CYSTOSCOPY WITH URETHRAL DILATATION Bilateral 04/11/2017   Procedure:  CYSTOSCOPY WITH URETHRAL DILATATION;  Surgeon: [name], MD;  Location: [facility] ORS;  Service: Urology;  Laterality: Bilateral;  . IR CONVERT RIGHT NEPHROSTOMY TO NEPHROURETERAL CATH  05/21/2017  . IR NEPHROSTOMY PLACEMENT RIGHT  04/13/2017  . PROSTATECTOMY      Home Medications:  Allergies as of 01/29/2018      Reactions   Codeine Nausea And Vomiting   Metformin Nausea And Vomiting   Penicillin G Nausea And Vomiting      Medication List        Accurate as of 01/29/18  3:17 PM. Always use your most recent med list.          acetaminophen 500 MG tablet Commonly known as:  TYLENOL Take 500 mg by mouth every 4 (four) hours as needed for moderate pain or fever.   albuterol 108 (90 Base) MCG/ACT inhaler Commonly known as:  PROVENTIL HFA;VENTOLIN HFA Inhale 2 puffs into the lungs every 4 (four) hours as needed for wheezing or shortness of breath.   atorvastatin 10 MG tablet Commonly known as:  LIPITOR Take 10 mg by mouth daily at 6 PM.   CALCIUM 600+D3 600-800 MG-UNIT Tabs Generic drug:  Calcium Carb-Cholecalciferol Take 1 tablet by mouth daily.   enzalutamide 40 MG capsule Commonly known as:  XTANDI Take 2 capsules (80 mg total) by mouth daily.   glucose blood test strip Use asdirected three times a day diag E11.65   ibuprofen 200 MG tablet Commonly known as:  ADVIL,MOTRIN Take 200 mg by mouth every 6 (six) hours as needed for fever or moderate pain.   insulin glargine 100 UNIT/ML injection Commonly known as:  LANTUS Inject 16 Units into the skin every morning. Based on sugar levels in am.   iron polysaccharides 150 MG capsule Commonly known as:  NIFEREX Take 1 capsule by mouth daily.   lisinopril 2.5 MG tablet Commonly known as:  PRINIVIL,ZESTRIL TAKE 1 TABLET BY MOUTH EVERY DAY   metFORMIN 1000 MG tablet Commonly known as:  GLUCOPHAGE Take 1 tablet by mouth daily.   metoprolol succinate 25 MG 24 hr tablet Commonly known as:  TOPROL-XL TAKE 1 TABLET BY  MOUTH EVERY DAY FOR BLOOD PRESSURE   mirabegron ER 25 MG Tb24 tablet Commonly known as:  MYRBETRIQ Take 1 tablet (25 mg total) by mouth daily.   multivitamin with minerals tablet Take 1 tablet by mouth daily.   omeprazole 20 MG capsule Commonly known as:  PRILOSEC TAKE ONE CAPSULE BY MOUTH EVERY DAY       Allergies:  Allergies  Allergen Reactions  . Codeine Nausea And Vomiting  . Metformin Nausea And Vomiting  . Penicillin G Nausea And Vomiting    Family History: Family History  Problem Relation Age of Onset  . Hypertension Father     Social History:  reports that he has never smoked. He has never used smokeless tobacco. He reports that he does not drink alcohol or use drugs.  ROS: UROLOGY Frequent Urination?: Yes Hard to postpone urination?: No Burning/pain with urination?: Yes Get up at night to urinate?: No Leakage of urine?: No Urine stream starts and stops?: No Trouble starting stream?: No Do you have to strain to urinate?: No Blood in urine?: No Urinary tract infection?: No  Sexually transmitted disease?: No Injury to kidneys or bladder?: No Painful intercourse?: No Weak stream?: No Erection problems?: No Penile pain?: No  Gastrointestinal Nausea?: No Vomiting?: No Indigestion/heartburn?: No Diarrhea?: No Constipation?: No  Constitutional Fever: No Night sweats?: No Weight loss?: No Fatigue?: Yes  Skin Skin rash/lesions?: No Itching?: No  Eyes Blurred vision?: Yes Double vision?: No  Ears/Nose/Throat Sore throat?: No Sinus problems?: No  Hematologic/Lymphatic Swollen glands?: No Easy bruising?: No  Cardiovascular Leg swelling?: No Chest pain?: No  Respiratory Cough?: No Shortness of breath?: No  Endocrine Excessive thirst?: No  Musculoskeletal Back pain?: No Joint pain?: No  Neurological Headaches?: No Dizziness?: No  Psychologic Depression?: No Anxiety?: No  Physical Exam: BP 138/78   Pulse 92   Ht 5\' 8"   (1.727 m)   Wt 131 lb (59.4 kg)   BMI 19.92 kg/m   Constitutional:  Alert and oriented, No acute distress.  Accompanied by daughter today. HEENT: [place] AT, moist mucus membranes.  Trachea midline, no masses. Cardiovascular: No clubbing, cyanosis, or edema. Respiratory: Normal respiratory effort, no increased work of breathing. GI: Abdomen is soft, nontender, nondistended, no abdominal masses GU: No CVA tenderness Skin: No rashes, bruises or suspicious lesions. Neurologic: Grossly intact, no focal deficits, moving all 4 extremities. Psychiatric: Normal mood and affect.  Laboratory Data: Lab Results  Component Value Date   WBC 5.9 01/07/2018   HGB 9.9 (L) 01/07/2018   HCT 31.4 (L) 01/07/2018   MCV 86.7 01/07/2018   PLT 288 01/07/2018    Lab Results  Component Value Date   CREATININE 1.11 01/07/2018    Lab Results  Component Value Date   HGBA1C 9.3 04/05/2017    Urinalysis UA grossly positive, urine culture pending  Pertinent Imaging: No new recent interval imaging  Assessment & Plan:    1. Prostate cancer [facility]) Metastatic castrate sensitive prostate cancer Managed by Dr. Oneita Jolly at the cancer center on Lupron/Xtandi/Xgeva  - Urinalysis, Complete - CULTURE, URINE COMPREHENSIVE  2. Bilateral hydronephrosis Managed with bilateral indwelling stents Due for stent exchange We discussed retrograde pyelogram at the time of stent exchange with consideration of removing stents based on drainage, previously had sluggish drainage bilaterally at last stent exchange Discussed risk of this in detail including risk of bleeding, infection, damage surrounding structures, need for stent replacement amongst others All questions answered Preop urine culture today Directed antibiotics based on chronic colonization, may treat preoperatively depending on urine culture data  3. Bladder neck contracture Intermittently cathing to keep bladder neck open, voiding spontaneously well  4.  Chronic bacteriuria As above, preop precautions  Schedule surgery as above  [name], MD  [facility] [address] [place], [place] [address] [phone]

## 2018-01-30 LAB — MICROSCOPIC EXAMINATION
Epithelial Cells (non renal): NONE SEEN /hpf (ref 0–10)
RBC, UA: >30 /hpf — ABNORMAL HIGH (ref 0–2)
WBC, UA: >30 /hpf — ABNORMAL HIGH (ref 0–5)

## 2018-01-30 LAB — URINALYSIS, COMPLETE
Bilirubin, UA: NEGATIVE
Glucose, UA: NEGATIVE
Ketones, UA: NEGATIVE
Nitrite, UA: POSITIVE — AB
Specific Gravity, UA: 1.01 (ref 1.005–1.030)
Urobilinogen, Ur: 0.2 mg/dL (ref 0.2–1.0)
pH, UA: 6 (ref 5.0–7.5)

## 2018-02-01 ENCOUNTER — Other Ambulatory Visit: Payer: Self-pay | Admitting: Radiology

## 2018-02-01 DIAGNOSIS — C61 Malignant neoplasm of prostate: Secondary | ICD-10-CM

## 2018-02-01 DIAGNOSIS — N1330 Unspecified hydronephrosis: Secondary | ICD-10-CM

## 2018-02-01 NOTE — Progress Notes (Signed)
Order received from Dr Erlene Quan for Ampicillin 1g IV for perioperative antibiotic. Dr Erlene Quan is aware of penicillin allergy of nausea & vomiting.

## 2018-02-02 LAB — CULTURE, URINE COMPREHENSIVE

## 2018-02-04 ENCOUNTER — Inpatient Hospital Stay (HOSPITAL_BASED_OUTPATIENT_CLINIC_OR_DEPARTMENT_OTHER): Payer: Medicare Other | Admitting: Internal Medicine

## 2018-02-04 ENCOUNTER — Other Ambulatory Visit: Payer: Self-pay | Admitting: *Deleted

## 2018-02-04 ENCOUNTER — Encounter: Payer: Self-pay | Admitting: Internal Medicine

## 2018-02-04 ENCOUNTER — Other Ambulatory Visit: Payer: Self-pay | Admitting: Internal Medicine

## 2018-02-04 ENCOUNTER — Inpatient Hospital Stay: Payer: Medicare Other

## 2018-02-04 ENCOUNTER — Inpatient Hospital Stay: Payer: Medicare Other | Attending: Internal Medicine

## 2018-02-04 VITALS — BP 132/85 | HR 76 | Resp 16 | Wt 131.0 lb

## 2018-02-04 DIAGNOSIS — Z191 Hormone sensitive malignancy status: Secondary | ICD-10-CM | POA: Diagnosis not present

## 2018-02-04 DIAGNOSIS — Z87440 Personal history of urinary (tract) infections: Secondary | ICD-10-CM | POA: Diagnosis not present

## 2018-02-04 DIAGNOSIS — C7951 Secondary malignant neoplasm of bone: Secondary | ICD-10-CM

## 2018-02-04 DIAGNOSIS — C61 Malignant neoplasm of prostate: Secondary | ICD-10-CM

## 2018-02-04 DIAGNOSIS — I2510 Atherosclerotic heart disease of native coronary artery without angina pectoris: Secondary | ICD-10-CM | POA: Insufficient documentation

## 2018-02-04 DIAGNOSIS — E785 Hyperlipidemia, unspecified: Secondary | ICD-10-CM

## 2018-02-04 DIAGNOSIS — C7911 Secondary malignant neoplasm of bladder: Secondary | ICD-10-CM | POA: Diagnosis not present

## 2018-02-04 DIAGNOSIS — I10 Essential (primary) hypertension: Secondary | ICD-10-CM | POA: Insufficient documentation

## 2018-02-04 DIAGNOSIS — N400 Enlarged prostate without lower urinary tract symptoms: Secondary | ICD-10-CM | POA: Insufficient documentation

## 2018-02-04 DIAGNOSIS — H539 Unspecified visual disturbance: Secondary | ICD-10-CM | POA: Insufficient documentation

## 2018-02-04 DIAGNOSIS — I6522 Occlusion and stenosis of left carotid artery: Secondary | ICD-10-CM

## 2018-02-04 DIAGNOSIS — E1165 Type 2 diabetes mellitus with hyperglycemia: Secondary | ICD-10-CM | POA: Diagnosis not present

## 2018-02-04 DIAGNOSIS — D509 Iron deficiency anemia, unspecified: Secondary | ICD-10-CM

## 2018-02-04 DIAGNOSIS — Z79899 Other long term (current) drug therapy: Secondary | ICD-10-CM | POA: Insufficient documentation

## 2018-02-04 DIAGNOSIS — K219 Gastro-esophageal reflux disease without esophagitis: Secondary | ICD-10-CM | POA: Diagnosis not present

## 2018-02-04 LAB — COMPREHENSIVE METABOLIC PANEL
ALT: 13 U/L (ref 0–44)
AST: 19 U/L (ref 15–41)
Albumin: 3.4 g/dL — ABNORMAL LOW (ref 3.5–5.0)
Alkaline Phosphatase: 79 U/L (ref 38–126)
Anion gap: 8 (ref 5–15)
BUN: 26 mg/dL — ABNORMAL HIGH (ref 8–23)
CO2: 23 mmol/L (ref 22–32)
Calcium: 9 mg/dL (ref 8.9–10.3)
Chloride: 102 mmol/L (ref 98–111)
Creatinine, Ser: 1.14 mg/dL (ref 0.61–1.24)
GFR calc Af Amer: >60 mL/min (ref >60)
GFR calc non Af Amer: 60 mL/min — ABNORMAL LOW (ref >60)
Glucose, Bld: 108 mg/dL — ABNORMAL HIGH (ref 70–99)
Potassium: 4.2 mmol/L (ref 3.5–5.1)
Sodium: 133 mmol/L — ABNORMAL LOW (ref 135–145)
Total Bilirubin: 0.4 mg/dL (ref 0.3–1.2)
Total Protein: 8.1 g/dL (ref 6.5–8.1)

## 2018-02-04 LAB — CBC WITH DIFFERENTIAL/PLATELET
Abs Immature Granulocytes: 0.05 10*3/uL (ref 0.00–0.07)
Basophils Absolute: 0 10*3/uL (ref 0.0–0.1)
Basophils Relative: 0 %
Eosinophils Absolute: 0.1 10*3/uL (ref 0.0–0.5)
Eosinophils Relative: 1 %
HCT: 31 % — ABNORMAL LOW (ref 39.0–52.0)
Hemoglobin: 9.7 g/dL — ABNORMAL LOW (ref 13.0–17.0)
Immature Granulocytes: 1 %
Lymphocytes Relative: 18 %
Lymphs Abs: 1.2 10*3/uL (ref 0.7–4.0)
MCH: 27.3 pg (ref 26.0–34.0)
MCHC: 31.3 g/dL (ref 30.0–36.0)
MCV: 87.3 fL (ref 80.0–100.0)
Monocytes Absolute: 0.9 10*3/uL (ref 0.1–1.0)
Monocytes Relative: 14 %
Neutro Abs: 4.6 10*3/uL (ref 1.7–7.7)
Neutrophils Relative %: 66 %
Platelets: 324 10*3/uL (ref 150–400)
RBC: 3.55 MIL/uL — ABNORMAL LOW (ref 4.22–5.81)
RDW: 13.9 % (ref 11.5–15.5)
WBC: 6.8 10*3/uL (ref 4.0–10.5)
nRBC: 0 % (ref 0.0–0.2)

## 2018-02-04 LAB — PSA: Prostatic Specific Antigen: 0.62 ng/mL (ref 0.00–4.00)

## 2018-02-04 MED ORDER — DENOSUMAB 120 MG/1.7ML ~~LOC~~ SOLN
120.0000 mg | Freq: Once | SUBCUTANEOUS | Status: AC
  Administered 2018-02-04: 120 mg via SUBCUTANEOUS
  Filled 2018-02-04: qty 1.7

## 2018-02-04 NOTE — Progress Notes (Signed)
[place] OFFICE PROGRESS NOTE  Patient Care Team: [name], MD as PCP - General (Internal Medicine)  Cancer Staging No matching staging information was found for the patient.   Oncology History   # FEB 2019-Metastatic prostate cancer/ castrate sensitive [bulky retroperitoneal adenopathy; invasion into the bladder; rectum] Bladder Bx- prostate. On Degarelix  # April 3rd 2019- [name] [173md];  Lupron q 6 M [[id number]]; MID AUG 2019- reduced dose to 853mday  # Poorly controlled DM-on insulin  # s/p right PCN [explanted]/ Foley cath [Dr.Brandon]; multiple UTIs.  ----------------------------------------------------------------- DIAGNOSIS: [ FEB 2019] MET. PROSTATE CA  STAGE:   IV ;GOALS: PALLIATIVE  CURRENT/MOST RECENT THERAPY [ Feb-April2019] Lupron+ [name]      [facility]     INTERVAL HISTORY:  Michael TRAINER89.o.  male pleasant patient [name] is here for follow-up.  Patient complains of mild to moderate fatigue.  Denies any joint pains.  Denies any blood in stools black or stools.  He is on p.o. iron once a day.  Denies any worsening vision changes.  Review of Systems  Constitutional: Negative for chills, diaphoresis, fever, malaise/fatigue and weight loss.  HENT: Negative for nosebleeds and sore throat.   Eyes: Negative for double vision.  Respiratory: Negative for cough, hemoptysis, sputum production, shortness of breath and wheezing.   Cardiovascular: Negative for chest pain, palpitations, orthopnea and leg swelling.  Gastrointestinal: Negative for abdominal pain, blood in stool, constipation, diarrhea, heartburn, melena, nausea and vomiting.  Genitourinary: Negative for dysuria, frequency and urgency.  Musculoskeletal: Positive for back pain and joint pain.  Skin: Negative.  Negative for itching and rash.  Neurological: Negative for  dizziness, tingling, focal weakness, weakness and headaches.  Endo/Heme/Allergies: Does not bruise/bleed easily.  Psychiatric/Behavioral: Negative for depression. The patient is not nervous/anxious and does not have insomnia.       PAST MEDICAL HISTORY :  Past Medical History:  Diagnosis Date  . Benign prostatic hypertrophy   . Bilateral carotid artery disease (HCC)    Mild plaque formation without obstructive disease noted on carotid Doppler.  . Coronary artery disease    [facility] The patient was told about 2 blockages which did not require revascularization.  . [place] complication [place]  . Essential hypertension   . GERD (gastroesophageal reflux disease)   . Hyperlipidemia   . Hypertension   . Left carotid artery stenosis   . Prostate cancer ([facility]    PAST SURGICAL HISTORY :   Past Surgical History:  Procedure Laterality Date  . CARDIAC CATHETERIZATION  2010  . CARDIAC CATHETERIZATION N/A 02/03/2015   Procedure: Left Heart Cath and Coronary Angiography;  Surgeon: MuWellington HampshireMD;  [facility];  Service: Cardiovascular;  Laterality: N/A;  . CATARACT EXTRACTION    . CYSTOSCOPY W/ RETROGRADES Bilateral 04/11/2017   Procedure: CYSTOSCOPY WITH RETROGRADE PYELOGRAM;  Surgeon: [name];  Location: [facility];  Service: Urology;  Laterality: Bilateral;  . CYSTOSCOPY W/ RETROGRADES Bilateral 09/12/2017   Procedure: CYSTOSCOPY WITH RETROGRADE PYELOGRAM;  Surgeon: [name];  Location: [facility];  Service: Urology;  Laterality: Bilateral;  . CYSTOSCOPY W/ URETERAL STENT PLACEMENT Bilateral 09/12/2017   Procedure: CYSTOSCOPY WITH STENT REPLACEMENT;  Surgeon: [name];  Location: [facility];  Service: Urology;  Laterality: Bilateral;  . CYSTOSCOPY WITH STENT PLACEMENT Left 04/11/2017   Procedure: CYSTOSCOPY WITH  STENT PLACEMENT and fulgeration;  Surgeon: [name], MD;   Location: [facility];  Service: Urology;  Laterality: Left;  . CYSTOSCOPY WITH URETHRAL DILATATION Bilateral 04/11/2017   Procedure: CYSTOSCOPY WITH URETHRAL DILATATION;  Surgeon: [name], MD;  Location: [facility];  Service: Urology;  Laterality: Bilateral;  . IR CONVERT RIGHT NEPHROSTOMY TO NEPHROURETERAL CATH  05/21/2017  . IR NEPHROSTOMY PLACEMENT RIGHT  04/13/2017  . PROSTATECTOMY      FAMILY HISTORY :   Family History  Problem Relation Age of Onset  . Hypertension Father     SOCIAL HISTORY:   Social History   Tobacco Use  . Smoking status: Never Smoker  . Smokeless tobacco: Never Used  Substance Use Topics  . Alcohol use: No  . Drug use: No    ALLERGIES:  is allergic to codeine and penicillin g.  MEDICATIONS:  Current Outpatient Medications  Medication Sig Dispense Refill  . acetaminophen (TYLENOL) 500 MG tablet Take 500 mg by mouth every 4 (four) hours as needed for moderate pain or fever.    [name] albuterol (PROVENTIL HFA;VENTOLIN HFA) 108 (90 Base) MCG/ACT inhaler Inhale 2 puffs into the lungs every 4 (four) hours as needed for wheezing or shortness of breath.    [name] atorvastatin (LIPITOR) 10 MG tablet Take 10 mg by mouth daily at 6 PM.     . Calcium Carb-Cholecalciferol (CALCIUM 600+D3) 600-800 MG-UNIT TABS Take 1 tablet by mouth daily.    . enzalutamide (XTANDI) 40 MG capsule Take 2 capsules (80 mg total) by mouth daily. 60 capsule 3  . glucose blood (ACCU-CHEK GUIDE) test strip Use asdirected three times a day diag E11.65 100 each 3  . ibuprofen (ADVIL,MOTRIN) 200 MG tablet Take 200 mg by mouth every 6 (six) hours as needed for fever or moderate pain.     [name] insulin glargine (LANTUS) 100 UNIT/ML injection Inject 16 Units into the skin every morning. Based on sugar levels in am.     . iron polysaccharides (NIFEREX) 150 MG capsule Take 150 mg by mouth daily.     [name] lisinopril (PRINIVIL,ZESTRIL) 2.5 MG tablet TAKE 1 TABLET BY MOUTH EVERY DAY (Patient taking differently: Take  2.5 mg by mouth daily. ) 90 tablet 4  . metFORMIN (GLUCOPHAGE) 1000 MG tablet Take 1,000 mg by mouth daily.     . metoprolol succinate (TOPROL-XL) 25 MG 24 hr tablet TAKE 1 TABLET BY MOUTH EVERY DAY FOR BLOOD PRESSURE (Patient taking differently: Take 25 mg by mouth daily. ) 90 tablet 3  . mirabegron ER (MYRBETRIQ) 25 MG TB24 tablet Take 1 tablet (25 mg total) by mouth daily. 30 tablet 11  . Multiple Vitamins-Minerals (MULTIVITAMIN WITH MINERALS) tablet Take 1 tablet by mouth daily.     [name] omeprazole (PRILOSEC) 20 MG capsule TAKE ONE CAPSULE BY MOUTH EVERY DAY (Patient taking differently: Take 20 mg by mouth daily. ) 90 capsule 2   No current facility-administered medications for this visit.     PHYSICAL EXAMINATION: ECOG PERFORMANCE STATUS: 1 - Symptomatic but completely ambulatory  BP 132/85 (BP Location: Left Arm, Patient Position: Sitting)   Pulse 76   Resp 16   Wt 131 lb (59.4 kg)   BMI 19.92 kg/m   Filed Weights   02/04/18 1029  Weight: 131 lb (59.4 kg)    Physical Exam  Constitutional: He is oriented to person, place, and time and well-developed, well-nourished, and in no distress.  Accompanied by his daughter.  HENT:  Head: Normocephalic and atraumatic.  Mouth/Throat: Oropharynx is clear and moist. No oropharyngeal exudate.  Eyes: Pupils are equal, round, and reactive to light.  Neck: Normal range of motion. Neck supple.  Cardiovascular: Normal rate and regular rhythm.  Pulmonary/Chest: No respiratory distress. He has no wheezes.  Abdominal: Soft. Bowel sounds are normal. He exhibits no distension and no mass. There is no abdominal tenderness. There is no rebound and no guarding.  Musculoskeletal: Normal range of motion.        General: No tenderness or edema.  Neurological: He is alert and oriented to person, place, and time.  Skin: Skin is warm.  Psychiatric: Affect normal.       LABORATORY DATA:  I have reviewed the data as listed    Component Value  Date/Time   NA 133 (L) 02/04/2018 0945   NA 136 05/07/2012 1328   K 4.2 02/04/2018 0945   K 4.5 05/07/2012 1328   CL 102 02/04/2018 0945   CL 104 05/07/2012 1328   CO2 23 02/04/2018 0945   CO2 28 05/07/2012 1328   GLUCOSE 108 (H) 02/04/2018 0945   GLUCOSE 73 05/07/2012 1328   BUN 26 (H) 02/04/2018 0945   BUN 16 05/07/2012 1328   CREATININE 1.14 02/04/2018 0945   CREATININE 0.84 05/07/2012 1328   CALCIUM 9.0 02/04/2018 0945   CALCIUM 8.8 05/07/2012 1328   PROT 8.1 02/04/2018 0945   ALBUMIN 3.4 (L) 02/04/2018 0945   AST 19 02/04/2018 0945   ALT 13 02/04/2018 0945   ALKPHOS 79 02/04/2018 0945   BILITOT 0.4 02/04/2018 0945   GFRNONAA 60 (L) 02/04/2018 0945   GFRNONAA >60 05/07/2012 1328   GFRAA >60 02/04/2018 0945   GFRAA >60 05/07/2012 1328    No results found for: SPEP, UPEP  Lab Results  Component Value Date   WBC 6.8 02/04/2018   NEUTROABS 4.6 02/04/2018   HGB 9.7 (L) 02/04/2018   HCT 31.0 (L) 02/04/2018   MCV 87.3 02/04/2018   PLT 324 02/04/2018      Chemistry      Component Value Date/Time   NA 133 (L) 02/04/2018 0945   NA 136 05/07/2012 1328   K 4.2 02/04/2018 0945   K 4.5 05/07/2012 1328   CL 102 02/04/2018 0945   CL 104 05/07/2012 1328   CO2 23 02/04/2018 0945   CO2 28 05/07/2012 1328   BUN 26 (H) 02/04/2018 0945   BUN 16 05/07/2012 1328   CREATININE 1.14 02/04/2018 0945   CREATININE 0.84 05/07/2012 1328      Component Value Date/Time   CALCIUM 9.0 02/04/2018 0945   CALCIUM 8.8 05/07/2012 1328   ALKPHOS 79 02/04/2018 0945   AST 19 02/04/2018 0945   ALT 13 02/04/2018 0945   BILITOT 0.4 02/04/2018 0945       RADIOGRAPHIC STUDIES: I have personally reviewed the radiological images as listed and agreed with the findings in the report. No results found.   ASSESSMENT & PLAN:  Prostate cancer [facility]) # Metastatic prostate cancer/ castrate sensitive [bulky retroperitoneal adenopathy; invasion into the bladder; rectum]. Lupron [72mq 632mlast  09/12/2017]; On Xtandi; June 24th 2019 CT scan shows significant improvement of prostate/pelvic soft tissue lesions/retroperitoneal lymphadenopathy; and bilateral hydronephrosis.    # Continue [name] 80 mg/day [reduced dose sec to fatigue]; PSA -Oct 2019-0.04 [[facility]] clinically stable.  #Fatigue-secondary to [place] Stable.  # Castrate sensitive prostate cancer mets to bone-Xgeva q [place].  #Obstructive uropathy-status post bilateral stenting [re-changed]; stable  # Anemia-iron studies- LOW; hemoglobin 9.7on  PO iron; Ferrahem x1wwek;a agin in next week.   #Right eye vision disturbance/pain-defer to ophthalmology.  Stable  # DISPOSITION:  # treatment today/X-geva.  # stool occult x2; UA today. # ferrahem next week.  # Follow up in 4 weeks-MD/labs-cbc/cmp/psa; Ferrahem/Lupron/X-geva-Dr.B   Cc: Dr. Ginette Pitman   Orders Placed This Encounter  Procedures  . Occult blood card to lab, stool    Standing Status:   Future    Standing Expiration Date:   02/05/2019  . Occult blood card to lab, stool    Standing Status:   Future    Standing Expiration Date:   02/05/2019  . CBC with Differential    Standing Status:   Standing    Number of Occurrences:   20    Standing Expiration Date:   02/05/2019  . Comprehensive metabolic panel    Standing Status:   Standing    Number of Occurrences:   20    Standing Expiration Date:   02/05/2019  . PSA    Standing Status:   Standing    Number of Occurrences:   20    Standing Expiration Date:   02/05/2019   All questions were answered. The patient knows to call the clinic with any problems, questions or concerns.      [name], MD 02/04/2018 1:21 PM

## 2018-02-04 NOTE — Assessment & Plan Note (Addendum)
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 Lupron [84m q 64m; last 09/12/2017]; On Xtandi; June 24th 2019 CT scan shows significant improvement of prostate/pelvic soft tissue lesions/retroperitoneal lymphadenopathy; and bilateral hydronephrosis.    # Continue [name] 80 mg/day [reduced dose sec to fatigue]; PSA -Oct 2019-0.04 [[facility]] clinically stable.  Will repeat scan in 2 months will order at next visit.  #Fatigue-secondary to [place].  Stable.  # Castrate sensitive prostate cancer mets to bone-?Xgeva q [place].  #Obstructive uropathy-status post bilateral stenting [re-changed]; stable followed by urology.  UA pending.  # Anemia-iron studies- LOW; hemoglobin 9.7on PO iron; Ferrahem x1wwek;a agin in next week.  Discussed regarding colonoscopy; reluctant.  We will get a CT scan in 2 months and then reevaluate.  #Right eye vision disturbance/pain-defer to ophthalmology.  Stable  # DISPOSITION:  # treatment today/X-geva.  # stool occult x2; UA today. # ferrahem next week.  # Follow up in 4 weeks-MD/labs-cbc/cmp/psa; Ferrahem/Lupron/X-geva-Dr.B  Cc: Dr. Ginette Pitman

## 2018-02-04 NOTE — Progress Notes (Signed)
U/a not collected today. Spoke with Dr. Rogue Bussing. V/o to cnl order since Dr. Erlene Quan recently ordered U/A. Positive for hematuria.

## 2018-02-04 NOTE — Telephone Encounter (Signed)
)   Ref Range & Units 09:45 4wk ago 62mo ago 71mo ago 53mo ago 24mo ago 40mo ago  WBC 4.0 - 10.5 K/uL 6.8  5.9  6.9  8.3 R 8.6 R 9.6 R 7.7 R  RBC 4.22 - 5.81 MIL/uL 3.55Low   3.62Low   3.48Low   3.41Low  R 3.82Low  R 3.76Low  R 3.94Low  R  Hemoglobin 13.0 - 17.0 g/dL 9.7Low   9.9Low   9.6Low   9.5Low  R 10.5Low  R 10.3Low  R 11.3Low  R  HCT 39.0 - 52.0 % 31.0Low   31.4Low   30.0Low   28.4Low  R 31.7Low  R 30.8Low  R 33.4Low  R  MCV 80.0 - 100.0 fL 87.3  86.7  86.2  83.4  82.8  82.1  84.9   MCH 26.0 - 34.0 pg 27.3  27.3  27.6  27.9  27.4  27.3  28.7   MCHC 30.0 - 36.0 g/dL 31.3  31.5  32.0  33.5 R 33.0 R 33.3 R 33.8 R  RDW 11.5 - 15.5 % 13.9  14.6  16.0High   19.5High  R 18.2High  R 15.0High  R 14.6High  R  Platelets 150 - 400 K/uL 324  288  306  345 R 379 R 418 R 368 R  nRBC 0.0 - 0.2 % 0.0  0.0  0.0       Neutrophils Relative % % 66  56  65  66  68  72  71   Neutro Abs 1.7 - 7.7 K/uL 4.6  3.4  4.5  5.6 R 5.8 R 7.1High  R 5.5 R  Lymphocytes Relative % 18  26  21  21  22  13  16    Lymphs Abs 0.7 - 4.0 K/uL 1.2  1.5  1.4  1.7 R 1.9 R 1.2 R 1.3 R  Monocytes Relative % 14  15  12  11  9  12  12    Monocytes Absolute 0.1 - 1.0 K/uL 0.9  0.9  0.8  0.9 R 0.8 R 1.1High  R 0.9 R  Eosinophils Relative % 1  1  1  1  1  2   0   Eosinophils Absolute 0.0 - 0.5 K/uL 0.1  0.0  0.1  0.1 R 0.1 R 0.2 R 0.0 R  Basophils Relative % 0  1  0  1  0  1  1   Basophils Absolute 0.0 - 0.1 K/uL 0.0  0.0  0.0  0.0 R, CM 0.0 R, CM 0.0 R, CM 0.0 R, CM  Immature Granulocytes % 1  1  1        Abs Immature Granulocytes 0.00 - 0.07 K/uL 0.05  0.03 CM 0.04 CM      Comment: Performed at [facility], [address] [place],  [address]  Resulting Agency  [facility] [place] [facility] [place] [facility] [place] [facility] [place] [facility] [place] [facility] [place] [facility]      Specimen Collected: 02/04/18 09:45  Last Resulted: 02/04/18 09:58       )   Ref Range & Units 09:45  Sodium 135 - 145 mmol/L 133Low    Potassium 3.5 - 5.1 mmol/L 4.2    Chloride 98 - 111 mmol/L 102   CO2 22 - 32 mmol/L 23   Glucose, Bld 70 - 99 mg/dL 108High    BUN 8 - 23 mg/dL 26High    Creatinine, Ser 0.61 -  1.24 mg/dL 1.14   Calcium 8.9 - 10.3 mg/dL 9.0   Total Protein 6.5 - 8.1 g/dL 8.1   Albumin 3.5 - 5.0 g/dL 3.4Low    AST 15 - 41 U/L 19   ALT 0 - 44 U/L 13   Alkaline Phosphatase 38 - 126 U/L 79   Total Bilirubin 0.3 - 1.2 mg/dL 0.4   GFR calc non Af Amer >60 mL/min 60Low    GFR calc Af Amer >60 mL/min >60   Anion gap 5 - 15 8   Comment: Performed at [facility], [address], [place], [place] [address]  Resulting Agency  [facility] CL[facility] LAB      Specimen Collected: 02/04/18 09:45

## 2018-02-06 ENCOUNTER — Telehealth: Payer: Self-pay | Admitting: Radiology

## 2018-02-06 ENCOUNTER — Other Ambulatory Visit: Payer: Self-pay | Admitting: Radiology

## 2018-02-06 MED FILL — XTANDI 40 MG CAPSULE: 40 | 30 days supply | Qty: 60 | Fill #0

## 2018-02-06 NOTE — Telephone Encounter (Signed)
-----   Message from [name], MD sent at 02/05/2018  6:51 PM EST ----- No need to repeat.  Yes, abx are fine.  [name], MD ----- Message ----- From: Ranell Patrick, RN Sent: 02/05/2018   2:30 PM EST To: [name], MD  Should ucx be repeated at pre-admit? Are amp & gent still ok for periop abx?

## 2018-02-08 ENCOUNTER — Other Ambulatory Visit: Payer: Self-pay | Admitting: Urgent Care

## 2018-02-09 ENCOUNTER — Other Ambulatory Visit: Payer: Self-pay | Admitting: Internal Medicine

## 2018-02-10 DIAGNOSIS — Z79899 Other long term (current) drug therapy: Secondary | ICD-10-CM | POA: Diagnosis not present

## 2018-02-10 DIAGNOSIS — Z191 Hormone sensitive malignancy status: Secondary | ICD-10-CM | POA: Diagnosis not present

## 2018-02-10 DIAGNOSIS — C61 Malignant neoplasm of prostate: Secondary | ICD-10-CM | POA: Diagnosis not present

## 2018-02-10 DIAGNOSIS — C7951 Secondary malignant neoplasm of bone: Secondary | ICD-10-CM | POA: Diagnosis not present

## 2018-02-10 DIAGNOSIS — C7911 Secondary malignant neoplasm of bladder: Secondary | ICD-10-CM | POA: Diagnosis not present

## 2018-02-10 DIAGNOSIS — D509 Iron deficiency anemia, unspecified: Secondary | ICD-10-CM | POA: Diagnosis not present

## 2018-02-11 ENCOUNTER — Other Ambulatory Visit: Payer: Self-pay

## 2018-02-11 ENCOUNTER — Inpatient Hospital Stay: Payer: Medicare Other

## 2018-02-11 ENCOUNTER — Encounter
Admission: RE | Admit: 2018-02-11 | Discharge: 2018-02-11 | Disposition: A | Discharge status: Home / Self Care | Payer: Medicare Other | Source: Ambulatory Visit | Attending: Urology | Admitting: Urology

## 2018-02-11 DIAGNOSIS — C7911 Secondary malignant neoplasm of bladder: Secondary | ICD-10-CM | POA: Diagnosis not present

## 2018-02-11 DIAGNOSIS — Z79899 Other long term (current) drug therapy: Secondary | ICD-10-CM | POA: Diagnosis not present

## 2018-02-11 DIAGNOSIS — Z191 Hormone sensitive malignancy status: Secondary | ICD-10-CM | POA: Diagnosis not present

## 2018-02-11 DIAGNOSIS — C61 Malignant neoplasm of prostate: Secondary | ICD-10-CM

## 2018-02-11 DIAGNOSIS — D509 Iron deficiency anemia, unspecified: Secondary | ICD-10-CM

## 2018-02-11 DIAGNOSIS — R9431 Abnormal electrocardiogram [ECG] [EKG]: Secondary | ICD-10-CM | POA: Diagnosis not present

## 2018-02-11 DIAGNOSIS — I10 Essential (primary) hypertension: Secondary | ICD-10-CM | POA: Diagnosis not present

## 2018-02-11 DIAGNOSIS — C7951 Secondary malignant neoplasm of bone: Secondary | ICD-10-CM | POA: Diagnosis not present

## 2018-02-11 DIAGNOSIS — E119 Type 2 diabetes mellitus without complications: Secondary | ICD-10-CM | POA: Insufficient documentation

## 2018-02-11 LAB — OCCULT BLOOD X 1 CARD TO LAB, STOOL
Fecal Occult Bld: POSITIVE — AB
Fecal Occult Bld: NEGATIVE

## 2018-02-11 MED ORDER — SODIUM CHLORIDE 0.9 % IV SOLN
Freq: Once | INTRAVENOUS | Status: AC
  Administered 2018-02-11: 15:00:00 via INTRAVENOUS
  Filled 2018-02-11: qty 250

## 2018-02-11 MED ORDER — SODIUM CHLORIDE 0.9 % IV SOLN
510.0000 mg | Freq: Once | INTRAVENOUS | Status: AC
  Administered 2018-02-11: 510 mg via INTRAVENOUS
  Filled 2018-02-11: qty 17

## 2018-02-11 NOTE — Patient Instructions (Signed)
Your procedure is scheduled on: Monday 02/25/18.  Report to DAY SURGERY DEPARTMENT LOCATED ON 2ND FLOOR MEDICAL MALL ENTRANCE. To find out your arrival time please call [phone] between 1PM - 3PM on Friday 02/22/17.    Remember: Instructions that are not followed completely may result in serious medical risk, up to and including death, or upon the discretion of your surgeon and anesthesiologist your surgery may need to be rescheduled.      _X__ 1. Do not eat food after midnight the night before your procedure.                 No gum chewing or hard candies. You may drink SUGAR FREE clear liquids up to 2 hours                 before you are scheduled to arrive for your surgery- DO NOT drink clear                 liquids within 2 hours of the start of your surgery.                    __X__2.  On the morning of surgery brush your teeth with toothpaste and water, you may rinse your mouth with mouthwash if you wish.  Do not swallow any toothpaste or mouthwash.     __X__3.  Notify your doctor if there is any change in your medical condition      (cold, fever, infections).       Do not wear jewelry, make-up, hairpins, clips or nail polish. Do not wear lotions, powders, or perfumes.  Do not shave 48 hours prior to surgery. Men may shave face and neck. Do not bring valuables to the hospital.    [facility] is not responsible for any belongings or valuables.  Contacts, dentures/partials or body piercings may not be worn into surgery. Bring a case for your contacts, glasses or hearing aids, a denture cup will be supplied.   Patients discharged the day of surgery will not be allowed to drive home.    Please read over the following fact sheets that you were given:   MRSA Information   __X__ Take these medicines the morning of surgery with A SIP OF WATER:     1. albuterol (PROVENTIL HFA;VENTOLIN HFA) 108 (90 Base) MCG/ACT inhaler  2. omeprazole (PRILOSEC) 20 MG capsule  3.    4.  5.  6.     __X__ Use CHG Soap/SAGE wipes as directed   _ X___ Use inhalers on the day of surgery. Also bring the inhaler with you to the hospital on the morning of surgery.   __X__ Stop Metformin 2 days prior to surgery. Last dose on Friday 02/22/18.     __X__ No insulin the morning of surgery.    __X__ Stop Blood Thinners Coumadin/Plavix/Xarelto/Pleta/Pradaxa/Eliquis/Effient/Aspirin    __X__ Stop Anti-inflammatories 7 days before surgery such as Advil, Ibuprofen, Motrin, BC or Goodies Powder, Naprosyn, Naproxen, Aleve, Aspirin, Meloxicam. Last does of Ibuprofen will be on 02/18/18. May take Tylenol if needed for pain or discomfort.     __X__ Do not begin taking any new herbal supplements before your surgery.

## 2018-02-12 ENCOUNTER — Telehealth: Payer: Self-pay | Admitting: Internal Medicine

## 2018-02-12 NOTE — Telephone Encounter (Signed)
FYI-  ILeft a message for the patient daughter to call us back regarding positive stool cards.  Recommend GI evaluation/colonoscopy.  She will need to reach out to PCP regarding referral to GI.

## 2018-02-14 NOTE — Pre-Procedure Instructions (Signed)
Faxed request for clearance/ ekg to dr Ginette Pitman and to dr Erlene Quan

## 2018-02-22 NOTE — OR Nursing (Signed)
Requested H&P from Dr. Erlene Quan for surgery scheduled on Monday.

## 2018-02-25 ENCOUNTER — Encounter: Payer: Self-pay | Admitting: Emergency Medicine

## 2018-02-25 ENCOUNTER — Encounter
Admission: RE | Disposition: A | Discharge status: Home / Self Care | Payer: Self-pay | Source: Home / Self Care | Attending: Urology

## 2018-02-25 ENCOUNTER — Inpatient Hospital Stay
Admission: EM | Admit: 2018-02-25 | Discharge: 2018-02-27 | DRG: 863 | Disposition: A | Discharge status: Home / Self Care | Payer: Medicare Other | Attending: Internal Medicine | Admitting: Internal Medicine

## 2018-02-25 ENCOUNTER — Encounter: Payer: Self-pay | Admitting: *Deleted

## 2018-02-25 ENCOUNTER — Ambulatory Visit
Admission: RE | Admit: 2018-02-25 | Discharge: 2018-02-25 | Disposition: A | Discharge status: Home / Self Care | Payer: Medicare Other | Source: Home / Self Care | Attending: Urology | Admitting: Urology

## 2018-02-25 ENCOUNTER — Inpatient Hospital Stay: Payer: Medicare Other

## 2018-02-25 ENCOUNTER — Ambulatory Visit: Payer: Medicare Other | Admitting: Anesthesiology

## 2018-02-25 ENCOUNTER — Other Ambulatory Visit: Payer: Self-pay

## 2018-02-25 DIAGNOSIS — A419 Sepsis, unspecified organism: Secondary | ICD-10-CM

## 2018-02-25 DIAGNOSIS — C61 Malignant neoplasm of prostate: Secondary | ICD-10-CM

## 2018-02-25 DIAGNOSIS — Z8249 Family history of ischemic heart disease and other diseases of the circulatory system: Secondary | ICD-10-CM

## 2018-02-25 DIAGNOSIS — Z888 Allergy status to other drugs, medicaments and biological substances status: Secondary | ICD-10-CM

## 2018-02-25 DIAGNOSIS — Z8619 Personal history of other infectious and parasitic diseases: Secondary | ICD-10-CM | POA: Diagnosis not present

## 2018-02-25 DIAGNOSIS — E872 Acidosis: Secondary | ICD-10-CM | POA: Diagnosis present

## 2018-02-25 DIAGNOSIS — N136 Pyonephrosis: Secondary | ICD-10-CM | POA: Diagnosis present

## 2018-02-25 DIAGNOSIS — Z955 Presence of coronary angioplasty implant and graft: Secondary | ICD-10-CM | POA: Insufficient documentation

## 2018-02-25 DIAGNOSIS — C799 Secondary malignant neoplasm of unspecified site: Secondary | ICD-10-CM | POA: Diagnosis present

## 2018-02-25 DIAGNOSIS — Z7951 Long term (current) use of inhaled steroids: Secondary | ICD-10-CM | POA: Insufficient documentation

## 2018-02-25 DIAGNOSIS — I6523 Occlusion and stenosis of bilateral carotid arteries: Secondary | ICD-10-CM

## 2018-02-25 DIAGNOSIS — D509 Iron deficiency anemia, unspecified: Secondary | ICD-10-CM | POA: Diagnosis present

## 2018-02-25 DIAGNOSIS — I2510 Atherosclerotic heart disease of native coronary artery without angina pectoris: Secondary | ICD-10-CM | POA: Insufficient documentation

## 2018-02-25 DIAGNOSIS — T8140XA Infection following a procedure, unspecified, initial encounter: Principal | ICD-10-CM | POA: Diagnosis present

## 2018-02-25 DIAGNOSIS — Z794 Long term (current) use of insulin: Secondary | ICD-10-CM

## 2018-02-25 DIAGNOSIS — Z885 Allergy status to narcotic agent status: Secondary | ICD-10-CM

## 2018-02-25 DIAGNOSIS — Z951 Presence of aortocoronary bypass graft: Secondary | ICD-10-CM | POA: Insufficient documentation

## 2018-02-25 DIAGNOSIS — N1330 Unspecified hydronephrosis: Secondary | ICD-10-CM

## 2018-02-25 DIAGNOSIS — Z87440 Personal history of urinary (tract) infections: Secondary | ICD-10-CM | POA: Diagnosis not present

## 2018-02-25 DIAGNOSIS — I10 Essential (primary) hypertension: Secondary | ICD-10-CM

## 2018-02-25 DIAGNOSIS — E7800 Pure hypercholesterolemia, unspecified: Secondary | ICD-10-CM | POA: Diagnosis present

## 2018-02-25 DIAGNOSIS — E119 Type 2 diabetes mellitus without complications: Secondary | ICD-10-CM | POA: Diagnosis present

## 2018-02-25 DIAGNOSIS — N320 Bladder-neck obstruction: Secondary | ICD-10-CM | POA: Diagnosis present

## 2018-02-25 DIAGNOSIS — K219 Gastro-esophageal reflux disease without esophagitis: Secondary | ICD-10-CM

## 2018-02-25 DIAGNOSIS — Z7984 Long term (current) use of oral hypoglycemic drugs: Secondary | ICD-10-CM

## 2018-02-25 DIAGNOSIS — Z880 Allergy status to penicillin: Secondary | ICD-10-CM

## 2018-02-25 DIAGNOSIS — Z791 Long term (current) use of non-steroidal anti-inflammatories (NSAID): Secondary | ICD-10-CM

## 2018-02-25 DIAGNOSIS — Z79899 Other long term (current) drug therapy: Secondary | ICD-10-CM | POA: Diagnosis not present

## 2018-02-25 DIAGNOSIS — N401 Enlarged prostate with lower urinary tract symptoms: Secondary | ICD-10-CM

## 2018-02-25 DIAGNOSIS — R6520 Severe sepsis without septic shock: Secondary | ICD-10-CM | POA: Diagnosis present

## 2018-02-25 DIAGNOSIS — N400 Enlarged prostate without lower urinary tract symptoms: Secondary | ICD-10-CM | POA: Diagnosis present

## 2018-02-25 DIAGNOSIS — Z9842 Cataract extraction status, left eye: Secondary | ICD-10-CM | POA: Diagnosis not present

## 2018-02-25 DIAGNOSIS — E785 Hyperlipidemia, unspecified: Secondary | ICD-10-CM | POA: Diagnosis present

## 2018-02-25 DIAGNOSIS — N390 Urinary tract infection, site not specified: Secondary | ICD-10-CM | POA: Diagnosis present

## 2018-02-25 DIAGNOSIS — T8144XA Sepsis following a procedure, initial encounter: Secondary | ICD-10-CM | POA: Diagnosis present

## 2018-02-25 DIAGNOSIS — Z9841 Cataract extraction status, right eye: Secondary | ICD-10-CM

## 2018-02-25 HISTORY — PX: CYSTOSCOPY W/ URETERAL STENT REMOVAL: SHX1430

## 2018-02-25 HISTORY — PX: CYSTOSCOPY W/ RETROGRADES: SHX1426

## 2018-02-25 HISTORY — DX: Anemia, unspecified: D64.9

## 2018-02-25 LAB — COMPREHENSIVE METABOLIC PANEL
ALT: 16 U/L (ref 0–44)
AST: 26 U/L (ref 15–41)
Albumin: 3.2 g/dL — ABNORMAL LOW (ref 3.5–5.0)
Alkaline Phosphatase: 75 U/L (ref 38–126)
Anion gap: 11 (ref 5–15)
BUN: 27 mg/dL — ABNORMAL HIGH (ref 8–23)
CO2: 16 mmol/L — ABNORMAL LOW (ref 22–32)
Calcium: 7.9 mg/dL — ABNORMAL LOW (ref 8.9–10.3)
Chloride: 103 mmol/L (ref 98–111)
Creatinine, Ser: 1.27 mg/dL — ABNORMAL HIGH (ref 0.61–1.24)
GFR calc Af Amer: >60 mL/min (ref >60)
GFR calc non Af Amer: 52 mL/min — ABNORMAL LOW (ref >60)
Glucose, Bld: 282 mg/dL — ABNORMAL HIGH (ref 70–99)
Potassium: 4 mmol/L (ref 3.5–5.1)
Sodium: 130 mmol/L — ABNORMAL LOW (ref 135–145)
Total Bilirubin: 0.4 mg/dL (ref 0.3–1.2)
Total Protein: 7.3 g/dL (ref 6.5–8.1)

## 2018-02-25 LAB — URINALYSIS, COMPLETE (UACMP) WITH MICROSCOPIC
Bilirubin Urine: NEGATIVE
Glucose, UA: >=500 mg/dL — AB
Ketones, ur: NEGATIVE mg/dL
Nitrite: NEGATIVE
Protein, ur: 30 mg/dL — AB
RBC / HPF: >50 RBC/hpf — ABNORMAL HIGH (ref 0–5)
Specific Gravity, Urine: 1.007 (ref 1.005–1.030)
Squamous Epithelial / HPF: NONE SEEN (ref 0–5)
WBC, UA: >50 WBC/hpf — ABNORMAL HIGH (ref 0–5)
pH: 6 (ref 5.0–8.0)

## 2018-02-25 LAB — CBC WITH DIFFERENTIAL/PLATELET
Abs Immature Granulocytes: 0.06 10*3/uL (ref 0.00–0.07)
Basophils Absolute: 0 10*3/uL (ref 0.0–0.1)
Basophils Relative: 0 %
Eosinophils Absolute: 0 10*3/uL (ref 0.0–0.5)
Eosinophils Relative: 0 %
HCT: 30.2 % — ABNORMAL LOW (ref 39.0–52.0)
Hemoglobin: 9.7 g/dL — ABNORMAL LOW (ref 13.0–17.0)
Immature Granulocytes: 0 %
Lymphocytes Relative: 3 %
Lymphs Abs: 0.5 10*3/uL — ABNORMAL LOW (ref 0.7–4.0)
MCH: 28.2 pg (ref 26.0–34.0)
MCHC: 32.1 g/dL (ref 30.0–36.0)
MCV: 87.8 fL (ref 80.0–100.0)
Monocytes Absolute: 0.9 10*3/uL (ref 0.1–1.0)
Monocytes Relative: 7 %
Neutro Abs: 12.5 10*3/uL — ABNORMAL HIGH (ref 1.7–7.7)
Neutrophils Relative %: 90 %
Platelets: 276 10*3/uL (ref 150–400)
RBC: 3.44 MIL/uL — ABNORMAL LOW (ref 4.22–5.81)
RDW: 16 % — ABNORMAL HIGH (ref 11.5–15.5)
WBC: 14 10*3/uL — ABNORMAL HIGH (ref 4.0–10.5)
nRBC: 0 % (ref 0.0–0.2)

## 2018-02-25 LAB — CG4 I-STAT (LACTIC ACID): Lactic Acid, Venous: 2.69 mmol/L (ref 0.5–1.9)

## 2018-02-25 LAB — PROTIME-INR
INR: 1.15
Prothrombin Time: 14.6 seconds (ref 11.4–15.2)

## 2018-02-25 LAB — GLUCOSE, CAPILLARY
Glucose-Capillary: 125 mg/dL — ABNORMAL HIGH (ref 70–99)
Glucose-Capillary: 113 mg/dL — ABNORMAL HIGH (ref 70–99)
Glucose-Capillary: 275 mg/dL — ABNORMAL HIGH (ref 70–99)

## 2018-02-25 SURGERY — REMOVAL, STENT, URETER, CYSTOSCOPIC
Anesthesia: General | Site: Ureter | Laterality: Bilateral

## 2018-02-25 MED ORDER — ENZALUTAMIDE 40 MG PO CAPS
80.0000 mg | ORAL_CAPSULE | Freq: Every day | ORAL | Status: DC
  Administered 2018-02-26 – 2018-02-27 (×2): 80 mg via ORAL
  Filled 2018-02-25 (×2): qty 2

## 2018-02-25 MED ORDER — ALBUTEROL SULFATE (2.5 MG/3ML) 0.083% IN NEBU: 2.5000 mg | INHALATION_SOLUTION | RESPIRATORY_TRACT | Status: DC | PRN

## 2018-02-25 MED ORDER — LACTATED RINGERS IV SOLN
INTRAVENOUS | Status: DC | PRN
  Administered 2018-02-25: 08:00:00 via INTRAVENOUS

## 2018-02-25 MED ORDER — CALCIUM CARBONATE-VITAMIN D 500-200 MG-UNIT PO TABS
1.0000 | ORAL_TABLET | Freq: Every evening | ORAL | Status: DC
  Administered 2018-02-26: 1 via ORAL
  Filled 2018-02-25: qty 1

## 2018-02-25 MED ORDER — PROPOFOL 10 MG/ML IV BOLUS
INTRAVENOUS | Status: AC
  Filled 2018-02-25: qty 80

## 2018-02-25 MED ORDER — BISACODYL 5 MG PO TBEC: 5.0000 mg | DELAYED_RELEASE_TABLET | Freq: Every day | ORAL | Status: DC | PRN

## 2018-02-25 MED ORDER — ACETAMINOPHEN 325 MG PO TABS: 650.0000 mg | ORAL_TABLET | Freq: Four times a day (QID) | ORAL | Status: DC | PRN

## 2018-02-25 MED ORDER — SODIUM CHLORIDE 0.9 % IV SOLN
INTRAVENOUS | Status: DC
  Administered 2018-02-25: 22:00:00 via INTRAVENOUS

## 2018-02-25 MED ORDER — SODIUM CHLORIDE 0.9 % IV BOLUS: 500.0000 mL | Freq: Once | INTRAVENOUS | Status: DC

## 2018-02-25 MED ORDER — GLIMEPIRIDE 2 MG PO TABS
2.0000 mg | ORAL_TABLET | Freq: Every day | ORAL | Status: DC
  Administered 2018-02-26 – 2018-02-27 (×2): 2 mg via ORAL
  Filled 2018-02-25 (×3): qty 1

## 2018-02-25 MED ORDER — ONDANSETRON HCL 4 MG/2ML IJ SOLN: 4.0000 mg | Freq: Four times a day (QID) | INTRAMUSCULAR | Status: DC | PRN

## 2018-02-25 MED ORDER — FENTANYL CITRATE (PF) 100 MCG/2ML IJ SOLN
INTRAMUSCULAR | Status: DC | PRN
  Administered 2018-02-25: 100 ug via INTRAVENOUS

## 2018-02-25 MED ORDER — DOCUSATE SODIUM 100 MG PO CAPS
100.0000 mg | ORAL_CAPSULE | Freq: Two times a day (BID) | ORAL | Status: DC
  Administered 2018-02-26 – 2018-02-27 (×3): 100 mg via ORAL
  Filled 2018-02-25 (×3): qty 1

## 2018-02-25 MED ORDER — FENTANYL CITRATE (PF) 100 MCG/2ML IJ SOLN
INTRAMUSCULAR | Status: AC
  Filled 2018-02-25: qty 2

## 2018-02-25 MED ORDER — MIRABEGRON ER 25 MG PO TB24
25.0000 mg | ORAL_TABLET | Freq: Every day | ORAL | Status: DC
  Administered 2018-02-26 – 2018-02-27 (×2): 25 mg via ORAL
  Filled 2018-02-25 (×3): qty 1

## 2018-02-25 MED ORDER — TRAZODONE HCL 50 MG PO TABS: 25.0000 mg | ORAL_TABLET | Freq: Every evening | ORAL | Status: DC | PRN

## 2018-02-25 MED ORDER — HYDROCODONE-ACETAMINOPHEN 5-325 MG PO TABS: 1.0000 | ORAL_TABLET | ORAL | Status: DC | PRN

## 2018-02-25 MED ORDER — ONDANSETRON HCL 4 MG PO TABS: 4.0000 mg | ORAL_TABLET | Freq: Four times a day (QID) | ORAL | Status: DC | PRN

## 2018-02-25 MED ORDER — PHENYLEPHRINE HCL 10 MG/ML IJ SOLN
INTRAMUSCULAR | Status: DC | PRN
  Administered 2018-02-25 (×2): 100 ug via INTRAVENOUS

## 2018-02-25 MED ORDER — SODIUM CHLORIDE 0.9 % IV SOLN
1.0000 g | Freq: Two times a day (BID) | INTRAVENOUS | Status: DC
  Administered 2018-02-26 – 2018-02-27 (×3): 1 g via INTRAVENOUS
  Filled 2018-02-25 (×4): qty 1

## 2018-02-25 MED ORDER — LIDOCAINE HCL (CARDIAC) PF 100 MG/5ML IV SOSY
PREFILLED_SYRINGE | INTRAVENOUS | Status: DC | PRN
  Administered 2018-02-25: 60 mg via INTRAVENOUS

## 2018-02-25 MED ORDER — ADULT MULTIVITAMIN W/MINERALS CH
1.0000 | ORAL_TABLET | Freq: Every day | ORAL | Status: DC
  Administered 2018-02-26 – 2018-02-27 (×2): 1 via ORAL
  Filled 2018-02-25 (×2): qty 1

## 2018-02-25 MED ORDER — SODIUM CHLORIDE 0.9 % IV SOLN
1.0000 g | Freq: Once | INTRAVENOUS | Status: AC
  Administered 2018-02-25: 1 g via INTRAVENOUS
  Filled 2018-02-25: qty 1

## 2018-02-25 MED ORDER — IOTHALAMATE MEGLUMINE 43 % IV SOLN
INTRAVENOUS | Status: DC | PRN
  Administered 2018-02-25: 20 mL via URETHRAL

## 2018-02-25 MED ORDER — SODIUM CHLORIDE 0.9 % IV SOLN
1.0000 g | INTRAVENOUS | Status: AC
  Administered 2018-02-25: 1 g via INTRAVENOUS
  Filled 2018-02-25: qty 1000

## 2018-02-25 MED ORDER — GENTAMICIN SULFATE 40 MG/ML IJ SOLN
5.0000 mg/kg | INTRAVENOUS | Status: AC
  Administered 2018-02-25: 300 mg via INTRAVENOUS
  Filled 2018-02-25: qty 7.5

## 2018-02-25 MED ORDER — POLYSACCHARIDE IRON COMPLEX 150 MG PO CAPS
150.0000 mg | ORAL_CAPSULE | Freq: Every day | ORAL | Status: DC
  Administered 2018-02-26 – 2018-02-27 (×2): 150 mg via ORAL
  Filled 2018-02-25 (×3): qty 1

## 2018-02-25 MED ORDER — ENOXAPARIN SODIUM 40 MG/0.4ML ~~LOC~~ SOLN
40.0000 mg | SUBCUTANEOUS | Status: DC
  Administered 2018-02-25 – 2018-02-26 (×2): 40 mg via SUBCUTANEOUS
  Filled 2018-02-25 (×2): qty 0.4

## 2018-02-25 MED ORDER — ACETAMINOPHEN 650 MG RE SUPP: 650.0000 mg | Freq: Four times a day (QID) | RECTAL | Status: DC | PRN

## 2018-02-25 MED ORDER — INSULIN ASPART 100 UNIT/ML ~~LOC~~ SOLN
0.0000 [IU] | Freq: Three times a day (TID) | SUBCUTANEOUS | Status: DC
  Administered 2018-02-26: 2 [IU] via SUBCUTANEOUS
  Administered 2018-02-27: 3 [IU] via SUBCUTANEOUS
  Administered 2018-02-27: 5 [IU] via SUBCUTANEOUS
  Filled 2018-02-25 (×3): qty 1

## 2018-02-25 MED ORDER — IBUPROFEN 400 MG PO TABS: 200.0000 mg | ORAL_TABLET | Freq: Four times a day (QID) | ORAL | Status: DC | PRN

## 2018-02-25 MED ORDER — SODIUM CHLORIDE 0.9 % IV SOLN: 1.0000 g | INTRAVENOUS | Status: DC

## 2018-02-25 MED ORDER — FENTANYL CITRATE (PF) 100 MCG/2ML IJ SOLN: 25.0000 ug | INTRAMUSCULAR | Status: DC | PRN

## 2018-02-25 MED ORDER — SODIUM CHLORIDE 0.9 % IV SOLN
INTRAVENOUS | Status: DC
  Administered 2018-02-25: 75 mL/h via INTRAVENOUS

## 2018-02-25 MED ORDER — DEXAMETHASONE SODIUM PHOSPHATE 10 MG/ML IJ SOLN
INTRAMUSCULAR | Status: DC | PRN
  Administered 2018-02-25: 4 mg via INTRAVENOUS

## 2018-02-25 MED ORDER — PROPOFOL 10 MG/ML IV BOLUS
INTRAVENOUS | Status: DC | PRN
  Administered 2018-02-25: 80 mg via INTRAVENOUS
  Administered 2018-02-25: 50 mg via INTRAVENOUS

## 2018-02-25 MED ORDER — ATORVASTATIN CALCIUM 20 MG PO TABS
10.0000 mg | ORAL_TABLET | Freq: Every day | ORAL | Status: DC
  Administered 2018-02-25 – 2018-02-26 (×2): 10 mg via ORAL
  Filled 2018-02-25 (×2): qty 1

## 2018-02-25 MED ORDER — ONDANSETRON HCL 4 MG/2ML IJ SOLN
INTRAMUSCULAR | Status: DC | PRN
  Administered 2018-02-25: 4 mg via INTRAVENOUS

## 2018-02-25 SURGICAL SUPPLY — 17 items
BAG DRAIN CYSTO-URO LG1000N (MISCELLANEOUS) ×2 IMPLANT
BRUSH SCRUB EZ  4% CHG (MISCELLANEOUS) ×2
BRUSH SCRUB EZ 4% CHG (MISCELLANEOUS) ×1 IMPLANT
CATH URETL 5X70 OPEN END (CATHETERS) ×2 IMPLANT
DRAPE UTILITY 15X26 TOWEL STRL (DRAPES) ×2 IMPLANT
GLOVE BIO SURGEON STRL SZ 6.5 (GLOVE) ×2 IMPLANT
GOWN STRL REUS W/ TWL LRG LVL3 (GOWN DISPOSABLE) ×2 IMPLANT
GOWN STRL REUS W/TWL LRG LVL3 (GOWN DISPOSABLE) ×4
KIT TURNOVER CYSTO (KITS) ×2 IMPLANT
PACK CYSTO AR (MISCELLANEOUS) ×2 IMPLANT
SENSORWIRE 0.038 NOT ANGLED (WIRE) ×2
SET CYSTO W/LG BORE CLAMP LF (SET/KITS/TRAYS/PACK) ×2 IMPLANT
SOL .9 NS 3000ML IRR  AL (IV SOLUTION) ×2
SOL .9 NS 3000ML IRR UROMATIC (IV SOLUTION) ×1 IMPLANT
SURGILUBE 2OZ TUBE FLIPTOP (MISCELLANEOUS) ×2 IMPLANT
WATER STERILE IRR 1000ML POUR (IV SOLUTION) ×2 IMPLANT
WIRE SENSOR 0.038 NOT ANGLED (WIRE) ×1 IMPLANT

## 2018-02-25 NOTE — H&P (Signed)
[facility] [place] NAME: Michael [name]    MR#:  [id number]  DATE OF BIRTH:  11-06-1933  DATE OF ADMISSION:  02/25/2018  PRIMARY CARE PHYSICIAN: [name], MD   REQUESTING/REFERRING PHYSICIAN: Dr. Merlyn Lot  CHIEF COMPLAINT: Fever   Chief Complaint  Patient presents with  . Fever    HISTORY OF PRESENT ILLNESS:  Michael [name]  is a 85 y.o. male with a known history of metastatic prostate cancer, managed by oncology getting chemotherapy underwent bilateral ureteral stent removal by Dr. Erlene Quan today, after discharge patient went home and started to have fever up to 102 Fahrenheit, brought in because of the same.  Patient had bilateral ureteral stent removed in OR today, seen by urology as well.  Patient denies any chest pain, shortness of breath, dysuria or lightheadedness.  PAST MEDICAL HISTORY:   Past Medical History:  Diagnosis Date  . Anemia    iron deficiency  . Benign prostatic hypertrophy   . Bilateral carotid artery disease (HCC)    Mild plaque formation without obstructive disease noted on carotid Doppler.  . Coronary artery disease    Previous cardiac catheterization at [facility] in 2010. The patient was told about 2 blockages which did not require revascularization.  . Diabetes mellitus without complication ([place])   . Essential hypertension   . GERD (gastroesophageal reflux disease)   . Hyperlipidemia   . Hypertension   . Left carotid artery stenosis   . Prostate cancer ([place]) 03/2017    PAST SURGICAL HISTOIRY:   Past Surgical History:  Procedure Laterality Date  . CARDIAC CATHETERIZATION  2010  . CARDIAC CATHETERIZATION N/A 02/03/2015   Procedure: Left Heart Cath and Coronary Angiography;  Surgeon: Wellington [name], MD;  Location: [place] [facility];  Service: Cardiovascular;  Laterality: N/A;  . CATARACT EXTRACTION Bilateral   . CYSTOSCOPY W/ RETROGRADES Bilateral 04/11/2017   Procedure:  CYSTOSCOPY WITH RETROGRADE PYELOGRAM;  Surgeon: [name], MD;  Location: [facility];  Service: Urology;  Laterality: Bilateral;  . CYSTOSCOPY W/ RETROGRADES Bilateral 09/12/2017   Procedure: CYSTOSCOPY WITH RETROGRADE PYELOGRAM;  Surgeon: [name], MD;  Location: [facility];  Service: Urology;  Laterality: Bilateral;  . CYSTOSCOPY W/ RETROGRADES Bilateral 02/25/2018   Procedure: CYSTOSCOPY WITH RETROGRADE PYELOGRAM;  Surgeon: [name], MD;  Location: [facility];  Service: Urology;  Laterality: Bilateral;  . CYSTOSCOPY W/ URETERAL STENT PLACEMENT Bilateral 09/12/2017   Procedure: CYSTOSCOPY WITH STENT REPLACEMENT;  Surgeon: [name], MD;  Location: [facility];  Service: Urology;  Laterality: Bilateral;  . CYSTOSCOPY W/ URETERAL STENT REMOVAL Bilateral 02/25/2018   Procedure: CYSTOSCOPY WITH STENT REMOVAL;  Surgeon: [name], MD;  Location: [facility];  Service: Urology;  Laterality: Bilateral;  . CYSTOSCOPY WITH STENT PLACEMENT Left 04/11/2017   Procedure: CYSTOSCOPY WITH STENT PLACEMENT and fulgeration;  Surgeon: [name], MD;  Location: [facility];  Service: Urology;  Laterality: Left;  . CYSTOSCOPY WITH URETHRAL DILATATION Bilateral 04/11/2017   Procedure: CYSTOSCOPY WITH URETHRAL DILATATION;  Surgeon: [name], MD;  Location: [facility];  Service: Urology;  Laterality: Bilateral;  . IR CONVERT RIGHT NEPHROSTOMY TO NEPHROURETERAL CATH  05/21/2017  . IR NEPHROSTOMY PLACEMENT RIGHT  04/13/2017    SOCIAL HISTORY:   Social History   Tobacco Use  . Smoking status: Never Smoker  . Smokeless tobacco: Never Used  Substance Use Topics  . Alcohol use: No    FAMILY HISTORY:   Family History  Problem Relation Age of  Onset  . Hypertension Father     DRUG ALLERGIES:  No Known Allergies  REVIEW OF SYSTEMS:  CONSTITUTIONAL: High fever today EYES: No blurred or double vision.  EARS, NOSE, AND THROAT: No tinnitus or ear pain.  RESPIRATORY: No cough, shortness of  breath, wheezing or hemoptysis.  CARDIOVASCULAR: No chest pain, orthopnea, edema.  GASTROINTESTINAL: No nausea, vomiting, diarrhea or abdominal pain.  GENITOURINARY: No dysuria, hematuria.  ENDOCRINE: No polyuria, nocturia,  HEMATOLOGY: No anemia, easy bruising or bleeding SKIN: No rash or lesion. MUSCULOSKELETAL: No joint pain or arthritis.   NEUROLOGIC: No tingling, numbness, weakness.  PSYCHIATRY: No anxiety or depression.   MEDICATIONS AT HOME:   Prior to Admission medications   Medication Sig Start Date End Date Taking? Authorizing Provider  acetaminophen (TYLENOL) 500 MG tablet Take 500 mg by mouth every 4 (four) hours as needed for moderate pain or fever.    [provider]  albuterol (PROVENTIL HFA;VENTOLIN HFA) 108 (90 Base) MCG/ACT inhaler Inhale 2 puffs into the lungs every 4 (four) hours as needed for wheezing or shortness of breath.    [provider]  atorvastatin (LIPITOR) 10 MG tablet Take 10 mg by mouth daily at 6 PM.     [provider]  Calcium Carb-Cholecalciferol (CALCIUM 600+D3) 600-800 MG-UNIT TABS Take 1 tablet by mouth every evening.     [provider]  glimepiride (AMARYL) 2 MG tablet Take 2 mg by mouth daily with breakfast.    [provider]  glucose blood (ACCU-CHEK GUIDE) test strip Use asdirected three times a day diag E11.65 04/19/17   [name], MD  ibuprofen (ADVIL,MOTRIN) 200 MG tablet Take 200 mg by mouth every 6 (six) hours as needed for fever or moderate pain.     [provider]  insulin glargine (LANTUS) 100 UNIT/ML injection Inject 15 Units into the skin every morning. Based on sugar levels in am.     [provider]  iron polysaccharides (NIFEREX) 150 MG capsule Take 150 mg by mouth daily.  12/28/17 12/28/18  [provider]  lisinopril (PRINIVIL,ZESTRIL) 2.5 MG tablet TAKE 1 TABLET BY MOUTH EVERY DAY Patient taking differently: Take 2.5 mg by mouth daily.  11/09/17   [name], MD  metFORMIN (GLUCOPHAGE) 1000 MG tablet Take 1,000 mg by mouth every evening. With dinner    [provider]  metoprolol succinate (TOPROL-XL) 25 MG 24 hr tablet TAKE 1 TABLET BY MOUTH EVERY DAY FOR BLOOD PRESSURE Patient taking differently: Take 25 mg by mouth every evening.  11/09/17   [name], MD  mirabegron ER (MYRBETRIQ) 25 MG TB24 tablet Take 1 tablet (25 mg total) by mouth daily. 10/26/17   Zara Council A, PA-C  Multiple Vitamins-Minerals (MULTIVITAMIN WITH MINERALS) tablet Take 1 tablet by mouth daily.     [provider]  omeprazole (PRILOSEC) 20 MG capsule TAKE ONE CAPSULE BY MOUTH EVERY DAY Patient taking differently: Take 20 mg by mouth daily.  07/02/17   [name], MD  XTANDI 40 MG capsule TAKE 2 CAPSULES (80 MG TOTAL) BY MOUTH DAILY. 02/05/18   [name], MD      VITAL SIGNS:  Blood pressure 105/87, pulse (!) 115, temperature 100.1 F (37.8 C), temperature source Oral, resp. rate 18, height 5\' 6"  (1.676 m), weight 59.7 kg, SpO2 98 %.  PHYSICAL EXAMINATION:  GENERAL:  85 y.o.-year-old patient lying in the bed with no acute distress.  EYES: Pupils equal, round, reactive to  light and accommodation. No scleral icterus. Extraocular muscles intact.  HEENT: Head atraumatic, normocephalic. Oropharynx and nasopharynx clear.  NECK:  Supple, no jugular venous distention. No thyroid enlargement, no tenderness.  LUNGS: Normal breath sounds bilaterally, no wheezing, rales,rhonchi or crepitation. No use of accessory muscles of respiration.  CARDIOVASCULAR: S1, S2 normal. No murmurs, rubs, or gallops.  ABDOMEN: Soft, nontender, nondistended. Bowel sounds present. No organomegaly or mass.  EXTREMITIES: No pedal edema, cyanosis, or clubbing.  NEUROLOGIC: Cranial nerves II through XII are intact. Muscle strength 5/5 in all extremities. Sensation intact. Gait not checked.  PSYCHIATRIC: The patient is alert and oriented x 3.  SKIN: No obvious  rash, lesion, or ulcer.   LABORATORY PANEL:   CBC Recent Labs  Lab 02/25/18 1707  WBC 14.0*  HGB 9.7*  HCT 30.2*  PLT 276   ------------------------------------------------------------------------------------------------------------------  Chemistries  Recent Labs  Lab 02/25/18 1707  NA 130*  K 4.0  CL 103  CO2 16*  GLUCOSE 282*  BUN 27*  CREATININE 1.27*  CALCIUM 7.9*  AST 26  ALT 16  ALKPHOS 75  BILITOT 0.4   ------------------------------------------------------------------------------------------------------------------  Cardiac Enzymes No results for input(s): TROPONINI in the last 168 hours. ------------------------------------------------------------------------------------------------------------------  RADIOLOGY:  Dg Chest Port 1 View  Result Date: 02/25/2018 CLINICAL DATA:  Sepsis. EXAM: PORTABLE CHEST 1 VIEW COMPARISON:  Radiographs of May 07, 2017. FINDINGS: The heart size and mediastinal contours are within normal limits. Both lungs are clear. The visualized skeletal structures are unremarkable. IMPRESSION: No active disease. Electronically Signed   By: Marijo Conception, M.D.   On: 02/25/2018 18:35    EKG:   Orders placed or performed during the hospital encounter of 02/11/18  . EKG 12-Lead  . EKG 12-Lead    IMPRESSION AND PLAN:   85 year old male patient with metastatic prostate cancer, status post bilateral ureteral stent, removal of ureteral stent today comes in with fever. 1.  Sepsis, fever likely due to instrumentation, UA also showed UTI.  Admit to hospital, start IV antibiotics with meropenem, follow blood cultures, urine cultures, continue broad-spectrum antibiotics.  Seen by urology, no indication for Foley placement or stent placement at this time.  Urology recommends n.p.o. after midnight in case he would if he develops flank pain or other indication for stent replacement otherwise continue his previous diet, epic text to Dr. Dorothea Ogle as  well. 2.  Diabetes mellitus type 2: Continue Amaryl, decrease the dose of Lantus.  Hold the metformin because of sepsis. 3.  Essential hypertension, restart metoprolol 25 mg p.o. daily.  Plan discussed with patient's daughter.  All the records are reviewed and case discussed with ED provider. Management plans discussed with the patient, family and they are in agreement.  CODE STATUS: Full code TOTAL TIME TAKING CARE OF THIS PATIENT: 55 minutes.    Epifanio Lesches M.D on 02/25/2018 at 7:23 PM  Between 7am to 6pm - Pager - [phone]  After 6pm go to [email or web address] - password [email or web address] [facility]  Office  [phone]  CC: Primary care physician; [name], MD  Note: This dictation was prepared with Dragon dictation along with smaller phrase technology. Any transcriptional errors that result from this process are unintentional.

## 2018-02-25 NOTE — Anesthesia Preprocedure Evaluation (Addendum)
Anesthesia Evaluation  Patient identified by MRN, date of birth, ID band Patient awake    Reviewed: Allergy & Precautions, H&P , NPO status , Patient's Chart, lab work & pertinent test results  Airway Mallampati: II       Dental  (+) Poor Dentition, Missing, Chipped   Pulmonary neg pulmonary ROS,           Cardiovascular hypertension, + angina + CAD  (-) Cardiac Stents and (-) CABG   02/03/15 LHC:   Mid LAD to Dist LAD lesion, 45% stenosed.  Prox LAD to Mid LAD lesion, 10% stenosed.  Ost 2nd Diag to 2nd Diag lesion, 30% stenosed.  The left ventricular systolic function is normal.   1. Left dominant coronary system with moderate one-vessel coronary artery disease involving a heavily calcified mid LAD. No evidence of obstructive disease. 2. Normal LV systolic function with mildly elevated left ventricular end-diastolic pressure.   Neuro/Psych negative neurological ROS  negative psych ROS   GI/Hepatic Neg liver ROS, GERD  Controlled,  Endo/Other  negative endocrine ROSdiabetes  Renal/GU Renal disease (hydronephrosis)     Musculoskeletal   Abdominal   Peds  Hematology  (+) Blood dyscrasia, anemia ,   Anesthesia Other Findings Past Medical History: No date: Anemia     Comment:  iron deficiency No date: Benign prostatic hypertrophy No date: Bilateral carotid artery disease (HCC)     Comment:  Mild plaque formation without obstructive disease noted               on carotid Doppler. No date: Coronary artery disease     Comment:  Previous cardiac catheterization at [facility] in 2010. The               patient was told about 2 blockages which did not require               revascularization. No date: Diabetes mellitus without complication (HCC) No date: Essential hypertension No date: GERD (gastroesophageal reflux disease) No date: Hyperlipidemia No date: Hypertension No date: Left carotid artery stenosis 03/2017:  Prostate cancer [facility])  Past Surgical History: 2010: CARDIAC CATHETERIZATION 02/03/2015: CARDIAC CATHETERIZATION; N/A     Comment:  Procedure: Left Heart Cath and Coronary Angiography;                Surgeon: Wellington [name], MD;  Location: [place] [facility]               LAB;  Service: Cardiovascular;  Laterality: N/A; No date: CATARACT EXTRACTION; Bilateral 04/11/2017: CYSTOSCOPY W/ RETROGRADES; Bilateral     Comment:  Procedure: CYSTOSCOPY WITH RETROGRADE PYELOGRAM;                Surgeon: [name], MD;  Location: [facility];                Service: Urology;  Laterality: Bilateral; 09/12/2017: CYSTOSCOPY W/ RETROGRADES; Bilateral     Comment:  Procedure: CYSTOSCOPY WITH RETROGRADE PYELOGRAM;                Surgeon: [name], MD;  Location: [facility];                Service: Urology;  Laterality: Bilateral; 09/12/2017: CYSTOSCOPY W/ URETERAL STENT PLACEMENT; Bilateral     Comment:  Procedure: CYSTOSCOPY WITH STENT REPLACEMENT;  Surgeon:               [name], MD;  Location: [facility];  Service:  Urology;  Laterality: Bilateral; 04/11/2017: CYSTOSCOPY WITH STENT PLACEMENT; Left     Comment:  Procedure: CYSTOSCOPY WITH STENT PLACEMENT and               fulgeration;  Surgeon: [name], MD;  Location:               [facility];  Service: Urology;  Laterality: Left; 04/11/2017: CYSTOSCOPY WITH URETHRAL DILATATION; Bilateral     Comment:  Procedure: CYSTOSCOPY WITH URETHRAL DILATATION;                Surgeon: [name], MD;  Location: [facility];                Service: Urology;  Laterality: Bilateral; 05/21/2017: IR CONVERT RIGHT NEPHROSTOMY TO NEPHROURETERAL CATH 04/13/2017: IR NEPHROSTOMY PLACEMENT RIGHT  BMI    Body Mass Index:  21.24 kg/m      Reproductive/Obstetrics negative OB ROS                            Anesthesia Physical Anesthesia Plan  ASA: III  Anesthesia Plan: General LMA   Post-op Pain Management:     Induction:   PONV Risk Score and Plan: Dexamethasone, Ondansetron and Treatment may vary due to age or medical condition  Airway Management Planned:   Additional Equipment:   Intra-op Plan:   Post-operative Plan:   Informed Consent: I have reviewed the patients History and Physical, chart, labs and discussed the procedure including the risks, benefits and alternatives for the proposed anesthesia with the patient or authorized representative who has indicated his/her understanding and acceptance.   Dental Advisory Given  Plan Discussed with: Anesthesiologist, CRNA and Surgeon  Anesthesia Plan Comments:         Anesthesia Quick Evaluation

## 2018-02-25 NOTE — ED Notes (Signed)
Pt in hallway bed.  Pt alert.  Iv meds started.  Pt waitng on admission.

## 2018-02-25 NOTE — ED Provider Notes (Signed)
[facility] Emergency Department Provider Note    First MD Initiated Contact with Patient 02/25/18 1734     (approximate)  I have reviewed the triage vital signs and the nursing notes.   HISTORY  Chief Complaint Fever    HPI ARMONIE STATEN is a 85 y.o. male presents for fevers chills malaise started today status post patient having cystoscopy with renal stent removal.  Has not been on any antibiotics.  Does have a history of recurrent UTIs.  Denies any nausea or vomiting.  No headache.  Does have some burning discomfort when he urinates.  No diarrhea.    Past Medical History:  Diagnosis Date  . Anemia    iron deficiency  . Benign prostatic hypertrophy   . Bilateral carotid artery disease (HCC)    Mild plaque formation without obstructive disease noted on carotid Doppler.  . Coronary artery disease    Previous cardiac catheterization at [facility] in 2010. The patient was told about 2 blockages which did not require revascularization.  . Diabetes mellitus without complication ([place])   . Essential hypertension   . GERD (gastroesophageal reflux disease)   . Hyperlipidemia   . Hypertension   . Left carotid artery stenosis   . Prostate cancer ([place]) 03/2017   Family History  Problem Relation Age of Onset  . Hypertension Father    Past Surgical History:  Procedure Laterality Date  . CARDIAC CATHETERIZATION  2010  . CARDIAC CATHETERIZATION N/A 02/03/2015   Procedure: Left Heart Cath and Coronary Angiography;  Surgeon: Wellington [name], MD;  Location: [place] CV LAB;  Service: Cardiovascular;  Laterality: N/A;  . CATARACT EXTRACTION Bilateral   . CYSTOSCOPY W/ RETROGRADES Bilateral 04/11/2017   Procedure: CYSTOSCOPY WITH RETROGRADE PYELOGRAM;  Surgeon: [name], MD;  Location: [facility] ORS;  Service: Urology;  Laterality: Bilateral;  . CYSTOSCOPY W/ RETROGRADES Bilateral 09/12/2017   Procedure: CYSTOSCOPY WITH RETROGRADE PYELOGRAM;  Surgeon:  [name], MD;  Location: [facility] ORS;  Service: Urology;  Laterality: Bilateral;  . CYSTOSCOPY W/ RETROGRADES Bilateral 02/25/2018   Procedure: CYSTOSCOPY WITH RETROGRADE PYELOGRAM;  Surgeon: [name], MD;  Location: [facility] ORS;  Service: Urology;  Laterality: Bilateral;  . CYSTOSCOPY W/ URETERAL STENT PLACEMENT Bilateral 09/12/2017   Procedure: CYSTOSCOPY WITH STENT REPLACEMENT;  Surgeon: [name], MD;  Location: [facility] ORS;  Service: Urology;  Laterality: Bilateral;  . CYSTOSCOPY W/ URETERAL STENT REMOVAL Bilateral 02/25/2018   Procedure: CYSTOSCOPY WITH STENT REMOVAL;  Surgeon: [name], MD;  Location: [facility] ORS;  Service: Urology;  Laterality: Bilateral;  . CYSTOSCOPY WITH STENT PLACEMENT Left 04/11/2017   Procedure: CYSTOSCOPY WITH STENT PLACEMENT and fulgeration;  Surgeon: [name], MD;  Location: [facility] ORS;  Service: Urology;  Laterality: Left;  . CYSTOSCOPY WITH URETHRAL DILATATION Bilateral 04/11/2017   Procedure: CYSTOSCOPY WITH URETHRAL DILATATION;  Surgeon: [name], MD;  Location: [facility] ORS;  Service: Urology;  Laterality: Bilateral;  . IR CONVERT RIGHT NEPHROSTOMY TO NEPHROURETERAL CATH  05/21/2017  . IR NEPHROSTOMY PLACEMENT RIGHT  04/13/2017   Patient Active Problem List   Diagnosis Date Noted  . Sepsis ([place]) 02/25/2018  . Fungal sinusitis 11/16/2017  . 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 Endophthalmitis, right eye 10/29/2017  . Type 2 diabetes mellitus without complication, with long-term current use of insulin ([place]) 06/15/2017  . Pure hypercholesterolemia 06/15/2017  . Anemia, unspecified 06/15/2017  . UTI due to extended-spectrum beta lactamase (ESBL) producing Escherichia coli 05/17/2017  . Protein-calorie malnutrition, severe 05/09/2017  . Acute febrile  illness 05/07/2017  . Goals of care, counseling/discussion 04/21/2017  . Prostate cancer ([place])   . Retroperitoneal lymphadenopathy   . Hydronephrosis of right kidney   . Abdominal pain 04/11/2017  .  Type II diabetes mellitus, uncontrolled ([place]) 04/05/2017  . Coronary artery disease involving native coronary artery of native heart without angina pectoris 03/15/2015  . Coronary artery disease involving native coronary artery with angina pectoris ([place]) 01/22/2015  . Essential hypertension   . Hyperlipidemia   . Spermatocele 03/26/2012  . Benign localized prostatic hyperplasia with lower urinary tract symptoms (LUTS) 03/22/2012  . Candidiasis of urogenital sites 03/22/2012  . ED (erectile dysfunction) of organic origin 03/22/2012  . Elevated prostate specific antigen (PSA) 03/22/2012  . Incomplete emptying of bladder 03/22/2012  . Redundant prepuce and phimosis 03/22/2012  . Urge incontinence 03/22/2012      Prior to Admission medications   Medication Sig Start Date End Date Taking? Authorizing Provider  acetaminophen (TYLENOL) 500 MG tablet Take 500 mg by mouth every 4 (four) hours as needed for moderate pain or fever.    [provider]  albuterol (PROVENTIL HFA;VENTOLIN HFA) 108 (90 Base) MCG/ACT inhaler Inhale 2 puffs into the lungs every 4 (four) hours as needed for wheezing or shortness of breath.    [provider]  atorvastatin (LIPITOR) 10 MG tablet Take 10 mg by mouth daily at 6 PM.     [provider]  Calcium Carb-Cholecalciferol (CALCIUM 600+D3) 600-800 MG-UNIT TABS Take 1 tablet by mouth every evening.     [provider]  glimepiride (AMARYL) 2 MG tablet Take 2 mg by mouth daily with breakfast.    [provider]  glucose blood (ACCU-CHEK GUIDE) test strip Use asdirected three times a day diag E11.65 04/19/17   [name], MD  ibuprofen (ADVIL,MOTRIN) 200 MG tablet Take 200 mg by mouth every 6 (six) hours as needed for fever or moderate pain.     [provider]  insulin glargine (LANTUS) 100 UNIT/ML injection Inject 15 Units into the skin every morning. Based on sugar levels in am.     [provider]  iron  polysaccharides (NIFEREX) 150 MG capsule Take 150 mg by mouth daily.  12/28/17 12/28/18  [provider]  lisinopril (PRINIVIL,ZESTRIL) 2.5 MG tablet TAKE 1 TABLET BY MOUTH EVERY DAY Patient taking differently: Take 2.5 mg by mouth daily.  11/09/17   [name], MD  metFORMIN (GLUCOPHAGE) 1000 MG tablet Take 1,000 mg by mouth every evening. With dinner    [provider]  metoprolol succinate (TOPROL-XL) 25 MG 24 hr tablet TAKE 1 TABLET BY MOUTH EVERY DAY FOR BLOOD PRESSURE Patient taking differently: Take 25 mg by mouth every evening.  11/09/17   [name], MD  mirabegron ER (MYRBETRIQ) 25 MG TB24 tablet Take 1 tablet (25 mg total) by mouth daily. 10/26/17   Zara Council A, PA-C  Multiple Vitamins-Minerals (MULTIVITAMIN WITH MINERALS) tablet Take 1 tablet by mouth daily.     [provider]  omeprazole (PRILOSEC) 20 MG capsule TAKE ONE CAPSULE BY MOUTH EVERY DAY Patient taking differently: Take 20 mg by mouth daily.  07/02/17   [name], MD  XTANDI 40 MG capsule TAKE 2 CAPSULES (80 MG TOTAL) BY MOUTH DAILY. 02/05/18   [name], MD    Allergies Patient has no known allergies.    Social History Social History   Tobacco Use  . Smoking status: Never Smoker  . Smokeless tobacco:  Never Used  Substance Use Topics  . Alcohol use: No  . Drug use: No    Review of Systems Patient denies headaches, rhinorrhea, blurry vision, numbness, shortness of breath, chest pain, edema, cough, abdominal pain, nausea, vomiting, diarrhea, dysuria, fevers, rashes or hallucinations unless otherwise stated above in HPI. ____________________________________________   PHYSICAL EXAM:  VITAL SIGNS: Vitals:   02/25/18 1654  BP: 105/87  Pulse: (!) 115  Resp: 18  Temp: 100.1 F (37.8 C)  SpO2: 98%    Constitutional: Alert and oriented. Pleasant and non toxic appearing Eyes: Conjunctivae are normal.  Head: Atraumatic. Nose: No  congestion/rhinnorhea. Mouth/Throat: Mucous membranes are moist.   Neck: No stridor. Painless ROM.  Cardiovascular: Normal rate, regular rhythm. Grossly normal heart sounds.  Good peripheral circulation. Respiratory: Normal respiratory effort.  No retractions. Lungs CTAB. Gastrointestinal: Soft and nontender. No distention. No abdominal bruits. No CVA tenderness. Genitourinary: deferred Musculoskeletal: No lower extremity tenderness nor edema.  No joint effusions. Neurologic:  Normal speech and language. No gross focal neurologic deficits are appreciated. No facial droop Skin:  Skin is warm, dry and intact. No rash noted. Psychiatric: Mood and affect are normal. Speech and behavior are normal.  ____________________________________________   LABS (all labs ordered are listed, but only abnormal results are displayed)  Results for orders placed or performed during the hospital encounter of 02/25/18 (from the past 24 hour(s))  Comprehensive metabolic panel     Status: Abnormal   Collection Time: 02/25/18  5:07 PM  Result Value Ref Range   Sodium 130 (L) 135 - 145 mmol/L   Potassium 4.0 3.5 - 5.1 mmol/L   Chloride 103 98 - 111 mmol/L   CO2 16 (L) 22 - 32 mmol/L   Glucose, Bld 282 (H) 70 - 99 mg/dL   BUN 27 (H) 8 - 23 mg/dL   Creatinine, Ser 1.27 (H) 0.61 - 1.24 mg/dL   Calcium 7.9 (L) 8.9 - 10.3 mg/dL   Total Protein 7.3 6.5 - 8.1 g/dL   Albumin 3.2 (L) 3.5 - 5.0 g/dL   AST 26 15 - 41 U/L   ALT 16 0 - 44 U/L   Alkaline Phosphatase 75 38 - 126 U/L   Total Bilirubin 0.4 0.3 - 1.2 mg/dL   GFR calc non Af Amer 52 (L) >60 mL/min   GFR calc Af Amer >60 >60 mL/min   Anion gap 11 5 - 15  CBC with Differential     Status: Abnormal   Collection Time: 02/25/18  5:07 PM  Result Value Ref Range   WBC 14.0 (H) 4.0 - 10.5 K/uL   RBC 3.44 (L) 4.22 - 5.81 MIL/uL   Hemoglobin 9.7 (L) 13.0 - 17.0 g/dL   HCT 30.2 (L) 39.0 - 52.0 %   MCV 87.8 80.0 - 100.0 fL   MCH 28.2 26.0 - 34.0 pg   MCHC 32.1  30.0 - 36.0 g/dL   RDW 16.0 (H) 11.5 - 15.5 %   Platelets 276 150 - 400 K/uL   nRBC 0.0 0.0 - 0.2 %   Neutrophils Relative % 90 %   Neutro Abs 12.5 (H) 1.7 - 7.7 K/uL   Lymphocytes Relative 3 %   Lymphs Abs 0.5 (L) 0.7 - 4.0 K/uL   Monocytes Relative 7 %   Monocytes Absolute 0.9 0.1 - 1.0 K/uL   Eosinophils Relative 0 %   Eosinophils Absolute 0.0 0.0 - 0.5 K/uL   Basophils Relative 0 %   Basophils Absolute 0.0 0.0 - 0.1  K/uL   Immature Granulocytes 0 %   Abs Immature Granulocytes 0.06 0.00 - 0.07 K/uL  Protime-INR     Status: None   Collection Time: 02/25/18  5:07 PM  Result Value Ref Range   Prothrombin Time 14.6 11.4 - 15.2 seconds   INR 1.15   Urinalysis, Complete w Microscopic     Status: Abnormal   Collection Time: 02/25/18  5:13 PM  Result Value Ref Range   Color, Urine YELLOW (A) YELLOW   APPearance CLOUDY (A) CLEAR   Specific Gravity, Urine 1.007 1.005 - 1.030   pH 6.0 5.0 - 8.0   Glucose, UA >=500 (A) NEGATIVE mg/dL   Hgb urine dipstick LARGE (A) NEGATIVE   Bilirubin Urine NEGATIVE NEGATIVE   Ketones, ur NEGATIVE NEGATIVE mg/dL   Protein, ur 30 (A) NEGATIVE mg/dL   Nitrite NEGATIVE NEGATIVE   Leukocytes, UA LARGE (A) NEGATIVE   RBC / HPF >50 (H) 0 - 5 RBC/hpf   WBC, UA >50 (H) 0 - 5 WBC/hpf   Bacteria, UA RARE (A) NONE SEEN   Squamous Epithelial / LPF NONE SEEN 0 - 5   Mucus PRESENT    Hyaline Casts, UA PRESENT   CG4 I-STAT (Lactic acid)     Status: Abnormal   Collection Time: 02/25/18  5:18 PM  Result Value Ref Range   Lactic Acid, Venous 2.69 (HH) 0.5 - 1.9 mmol/L   Comment NOTIFIED PHYSICIAN    ____________________________________________ ____________________________________________  RADIOLOGY   ____________________________________________   PROCEDURES  Procedure(s) performed:  Procedures    Critical Care performed: no ____________________________________________   INITIAL IMPRESSION / ASSESSMENT AND PLAN / ED COURSE  Pertinent labs &  imaging results that were available during my care of the patient were reviewed by me and considered in my medical decision making (see chart for details).   DDX: Cystitis, dehydration, sepsis, electrolyte abnormality, prostatitis  MOMIN MISKO is a 85 y.o. who presents to the ED with symptoms as described above.  Patient febrile but nontoxic-appearing.  Does have leukocytosis and mildly elevated lactic acidosis.  Patient does meet septic criteria but not any any evidence of shock.  Patient has been evaluated by urology with plan for admission the hospital for IV Merrem.  Blood cultures and urine culture sent.  No indication for imaging at this time.      As part of my medical decision making, I reviewed the following data within the [place] notes reviewed and incorporated, Labs reviewed, notes from prior ED visits.  ____________________________________________   FINAL CLINICAL IMPRESSION(S) / ED DIAGNOSES  Final diagnoses:  Sepsis without acute organ dysfunction, due to unspecified organism [facility])      NEW MEDICATIONS STARTED DURING THIS VISIT:  New Prescriptions   No medications on file     Note:  This document was prepared using Dragon voice recognition software and may include unintentional dictation errors.    Merlyn Lot, MD 02/25/18 [phone]

## 2018-02-25 NOTE — Anesthesia Procedure Notes (Signed)
Procedure Name: LMA Insertion Date/Time: 02/25/2018 7:55 AM Performed by: Leander Rams, CRNA Pre-anesthesia Checklist: Patient identified, Emergency Drugs available, Suction available, Patient being monitored and Timeout performed Patient Re-evaluated:Patient Re-evaluated prior to induction Oxygen Delivery Method: Circle system utilized Preoxygenation: Pre-oxygenation with 100% oxygen Induction Type: IV induction LMA: LMA inserted LMA Size: 4.0 Number of attempts: 1

## 2018-02-25 NOTE — Discharge Instructions (Signed)
You have been scheduled for renal ultrasound in the near future.  Please keep this follow-up.  He also has lab scheduled through the cancer center already.  No need to repeat labs for me, I can just follow-up with those lab values.   AMBULATORY SURGERY  DISCHARGE INSTRUCTIONS   1) The drugs that you were given will stay in your system until tomorrow so for the next 24 hours you should not:  A) Drive an automobile B) Make any legal decisions C) Drink any alcoholic beverage   2) You may resume regular meals tomorrow.  Today it is better to start with liquids and gradually work up to solid foods.  You may eat anything you prefer, but it is better to start with liquids, then soup and crackers, and gradually work up to solid foods.   3) Please notify your doctor immediately if you have any unusual bleeding, trouble breathing, redness and pain at the surgery site, drainage, fever, or pain not relieved by medication.    4) Additional Instructions:        Please contact your physician with any problems or Same Day Surgery at [phone], Monday through Friday 6 am to 4 pm, or [place] at [facility] number at [phone].

## 2018-02-25 NOTE — ED Notes (Signed)
Report called to [place] rn floor nurse

## 2018-02-25 NOTE — Anesthesia Postprocedure Evaluation (Signed)
Anesthesia Post Note  Patient: Michael [name]  Procedure(s) Performed: CYSTOSCOPY WITH STENT REMOVAL (Bilateral Ureter) CYSTOSCOPY WITH RETROGRADE PYELOGRAM (Bilateral Ureter)  Patient location during evaluation: PACU Anesthesia Type: General Level of consciousness: awake and alert Pain management: pain level controlled Vital Signs Assessment: post-procedure vital signs reviewed and stable Respiratory status: spontaneous breathing, nonlabored ventilation, respiratory function stable and patient connected to nasal cannula oxygen Cardiovascular status: blood pressure returned to baseline and stable Postop Assessment: no apparent nausea or vomiting Anesthetic complications: no     Last Vitals:  Vitals:   02/25/18 0917 02/25/18 1005  BP: (!) 139/96 124/69  Pulse: 61 99  Resp: 16 16  Temp: 36.7 C (!) 36.4 C  SpO2: 100% 100%    Last Pain:  Vitals:   02/25/18 1005  TempSrc: Oral  PainSc: 0-No pain                 Durenda Hurt

## 2018-02-25 NOTE — Consult Note (Signed)
Michael [name] 1933-10-24 [id number]  CC: Fever   HPI: I was asked to see Michael [name] in consultation for fever from Michael [name].  He is an 85 year old male with metastatic prostate cancer managed by oncology on ADT/Xtandi who underwent bilateral ureteral stent removal with Michael [name] today.  Bilateral retrograde pyelograms in the operating room today showed good drainage of contrast bilaterally, and the decision was made to remove both ureteral stents.  After discharging home this afternoon, the daughter checked his temperature as he looked flushed and it was 102.7.  He was given Tylenol at home.  He denies any complaints currently including flank pain, difficulty voiding, lightheadedness, chest pain, or shortness of breath.  He is voiding yellow urine without difficulty.  His last urine culture 01/29/2018 showed more than 3 organisms, however these were not speciated and sensitivities were not sent.  He was given ampicillin and gentamicin pre-operatively today.  He does have a history of E. coli and Pseudomonas with multiple antibiotic resistances (see urine culture 08/31/2017).  There are no aggravating or alleviating factors.  Severity is moderate.   PMH: Past Medical History:  Diagnosis Date  . Anemia    iron deficiency  . Benign prostatic hypertrophy   . Bilateral carotid artery disease (HCC)    Mild plaque formation without obstructive disease noted on carotid Doppler.  . Coronary artery disease    Previous cardiac catheterization at [facility] in 2010. The patient was told about 2 blockages which did not require revascularization.  . Diabetes mellitus without complication ([place])   . Essential hypertension   . GERD (gastroesophageal reflux disease)   . Hyperlipidemia   . Hypertension   . Left carotid artery stenosis   . Prostate cancer ([place]) 03/2017    Surgical History: Past Surgical History:  Procedure Laterality Date  . CARDIAC CATHETERIZATION  2010  . CARDIAC  CATHETERIZATION N/A 02/03/2015   Procedure: Left Heart Cath and Coronary Angiography;  Surgeon: Michael [name], MD;  Location: [place] CV LAB;  Service: Cardiovascular;  Laterality: N/A;  . CATARACT EXTRACTION Bilateral   . CYSTOSCOPY W/ RETROGRADES Bilateral 04/11/2017   Procedure: CYSTOSCOPY WITH RETROGRADE PYELOGRAM;  Surgeon: [name], MD;  Location: [facility] ORS;  Service: Urology;  Laterality: Bilateral;  . CYSTOSCOPY W/ RETROGRADES Bilateral 09/12/2017   Procedure: CYSTOSCOPY WITH RETROGRADE PYELOGRAM;  Surgeon: [name], MD;  Location: [facility];  Service: Urology;  Laterality: Bilateral;  . CYSTOSCOPY W/ RETROGRADES Bilateral 02/25/2018   Procedure: CYSTOSCOPY WITH RETROGRADE PYELOGRAM;  Surgeon: [name], MD;  Location: [facility];  Service: Urology;  Laterality: Bilateral;  . CYSTOSCOPY W/ URETERAL STENT PLACEMENT Bilateral 09/12/2017   Procedure: CYSTOSCOPY WITH STENT REPLACEMENT;  Surgeon: [name], MD;  Location: [facility];  Service: Urology;  Laterality: Bilateral;  . CYSTOSCOPY W/ URETERAL STENT REMOVAL Bilateral 02/25/2018   Procedure: CYSTOSCOPY WITH STENT REMOVAL;  Surgeon: [name], MD;  Location: [facility];  Service: Urology;  Laterality: Bilateral;  . CYSTOSCOPY WITH STENT PLACEMENT Left 04/11/2017   Procedure: CYSTOSCOPY WITH STENT PLACEMENT and fulgeration;  Surgeon: [name], MD;  Location: [facility];  Service: Urology;  Laterality: Left;  . CYSTOSCOPY WITH URETHRAL DILATATION Bilateral 04/11/2017   Procedure: CYSTOSCOPY WITH URETHRAL DILATATION;  Surgeon: [name], MD;  Location: [facility];  Service: Urology;  Laterality: Bilateral;  . IR CONVERT RIGHT NEPHROSTOMY TO NEPHROURETERAL CATH  05/21/2017  . IR NEPHROSTOMY PLACEMENT RIGHT  04/13/2017     Allergies: No Known Allergies  Family History:  Family History  Problem Relation Age of Onset  . Hypertension Father     Social History:  reports that he has never smoked. He has never  used smokeless tobacco. He reports that he does not drink alcohol or use drugs.  ROS: Please see flowsheet from today's date for complete review of systems.  Physical Exam: BP 105/87   Pulse (!) 115   Temp 100.1 F (37.8 C) (Oral)   Resp 18   Ht _0  (1.676 m)   Wt 59.7 kg   SpO2 98%   BMI 21.24 kg/m    Constitutional:  Alert and oriented, No acute distress. Cardiovascular: No clubbing, cyanosis, or edema. Respiratory: Normal respiratory effort, no increased work of breathing. GI: Abdomen is soft, nontender, nondistended, no abdominal masses GU: No CVA tenderness Lymph: No cervical or inguinal lymphadenopathy. Skin: No rashes, bruises or suspicious lesions. Neurologic: Grossly intact, no focal deficits, moving all 4 extremities. Psychiatric: Normal mood and affect.  Laboratory Data: Reviewed Serum creatinine 1.27, EGFR 52 WBC 14 Lactate 2.69  Urinalysis today rare bacteria, greater than 50 WBCs, nitrite negative  Urine and blood culture pending  Pertinent Imaging: None to review  Assessment & Plan:   In summary, Michael [name] is a [name]-morbid 85 year old male with metastatic prostate cancer POD #0 from bilateral ureteral stent removal and bilateral retrograde pyelograms with good drainage of contrast bilaterally.  He presents with fever to 102.7 and concern for post-manipulation UTI.  He denies any flank pain worrisome for acute upper tract obstruction, and is voiding yellow urine spontaneously. Case discussed with Michael [name].  -Recommend admission to hospitalist for broad-spectrum antibiotics, recommend meropenem based on prior culture data -No indication for Foley placement or ureteral stent replacement -NPO at midnight in case he develops flank pain or other indication for stent replacement -Follow-up urine and blood cultures -Michael [name] will continue to follow tomorrow  Michael Co, MD  [facility] [address] [place], [place] [address] [phone]

## 2018-02-25 NOTE — Progress Notes (Signed)
Pharmacy Antibiotic Note  Michael [name] is a 85 y.o. male admitted on 02/25/2018 with sepsis.  Pharmacy has been consulted for meropenem dosing.  Plan: Will order meropenem 1 g q12H  Height: 5\' 6"  (167.6 cm) Weight: 131 lb 9.8 oz (59.7 kg) IBW/kg (Calculated) : 63.8  Temp (24hrs), Avg:98.2 F (36.8 C), Min:97.5 F (36.4 C), Max:100.1 F (37.8 C)  Recent Labs  Lab 02/25/18 1707 02/25/18 1718  WBC 14.0*  --   CREATININE 1.27*  --   LATICACIDVEN  --  2.69*    Estimated Creatinine Clearance: 37.9 mL/min (A) (by C-G formula based on SCr of 1.27 mg/dL (H)).    No Known Allergies  Antimicrobials this admission: 1/6 meropenem >>    Dose adjustments this admission: Meropenem renal adjusted  Microbiology results: 1/6 BCx: pending  Thank you for allowing pharmacy to be a part of this patient's care.  Oswald Hillock, PharmD, BCPS Clinical Pharmacist 02/25/2018 6:18 PM

## 2018-02-25 NOTE — ED Triage Notes (Signed)
Pt had 2 renal stents removed this morning.  Spiked temp of 102.7 at home.  Wife gave 2 tylenol at 1600. Low grade temp in triage. Pt denies pain at this time.  Unlabored. NAD.

## 2018-02-25 NOTE — ED Notes (Signed)
Daughter states pt had kidney stents removed this am at [facility].  This afternoon, pt started with a fever.  Tylenol given by family.  Pt in hallway bed.  Pt alert.  Iv in place.  Pt denies pain.  States bloody urine from stent removal

## 2018-02-25 NOTE — Transfer of Care (Signed)
Immediate Anesthesia Transfer of Care Note  Patient: [name]  Procedure(s) Performed: CYSTOSCOPY WITH STENT REMOVAL (Bilateral Ureter) CYSTOSCOPY WITH RETROGRADE PYELOGRAM (Bilateral Ureter)  Patient Location: PACU  Anesthesia Type:General  Level of Consciousness: awake  Airway & Oxygen Therapy: Patient Spontanous Breathing  Post-op Assessment: Report given to RN  Post vital signs: Reviewed  Last Vitals:  Vitals Value Taken Time  BP 117/68 02/25/2018  8:32 AM  Temp 36.4 C 02/25/2018  8:32 AM  Pulse 80 02/25/2018  8:34 AM  Resp 14 02/25/2018  8:34 AM  SpO2 100 % 02/25/2018  8:34 AM  Vitals shown include unvalidated device data.  Last Pain:  Vitals:   02/25/18 0639  TempSrc: Oral  PainSc: 0-No pain      Patients Stated Pain Goal: 0 (95/63/87 5643)  Complications: No apparent anesthesia complications

## 2018-02-25 NOTE — Op Note (Signed)
Date of procedure: 02/25/18  Preoperative diagnosis:  1. Prostate cancer 2. Bilateral hydronephrosis  Postoperative diagnosis:  1. Prostate cancer  Procedure: 1. Cystoscopy 2. Bilateral retrograde pyelogram 3. Bilateral ureteral stent removal  Surgeon: [name], MD  Anesthesia: General  Complications: None  Intraoperative findings: Fullness of right collecting system with somewhat sluggish drainage but prompt drainage of the ureter without overt hydronephrosis.  Left ureter somewhat dilated but prompt drainage.  Bilateral stents removed today.  EBL: none  Specimens: none  Drains: none  Indication: Michael [name] is a 85 y.o. patient with locally advanced prostate cancer with history of bilateral ureteral obstruction managed with bilateral ureteral stents now with regression of his disease on pharmacotherapy.  After reviewing the management options for treatment, he elected to proceed with the above surgical procedure(s). We have discussed the potential benefits and risks of the procedure, side effects of the proposed treatment, the likelihood of the patient achieving the goals of the procedure, and any potential problems that might occur during the procedure or recuperation. Informed consent has been obtained.  Description of procedure:  The patient was taken to the operating room and general anesthesia was induced.  The patient was placed in the dorsal lithotomy position, prepped and draped in the usual sterile fashion, and preoperative antibiotics were administered. A preoperative time-out was performed.   A 21 French scope was advanced per urethra into the bladder.  Notably, the prostatic fossa was markedly irregular and relatively tight but ultimately the 21 [place] scope was able to be passed.  Tension was turned to the right ureteral orifice from which a ureteral stent was seen emanating.  Graspers were used to grasp the distal coil of the stent and ultimately remove  the stent in entirety without difficulty.  There was only mild encrustation.  There was some debris in the bladder.  The right UO was then intubated using a 5 [place] open-ended ureteral catheter and gentle retrograde pyelogram was performed.  The ureter itself was normal with a mildly dilated renal pelvis without overt caliectasis.  There is prompt drainage of the ureter with somewhat stagnant drainage of the renal pelvis.  After 5 minutes, approximately half or so of the contrast material drained.  Attention was then turned to the left ureteral orifice.  The left distal stent coil was grasped and removed.  A retrograde was performed on the side which revealed a somewhat dilated ureter without any dilation of the renal pelvis or upper tract collecting system.  Again, there was prompt excretion of contrast material with only slight retained contrast after about 5 minutes in the upper tract.  This is relatively reassuring of adequate drainage of this unit.  At this point, based on findings of retrograde pyelogram, I did feel comfortable leaving both stent out.  Patient was then clean dry, repositioned supine position, reversed anesthesia, taken the PACU in stable condition.  Plan:  We will have close follow-up with [place] with labs next week, renal ultrasound a week thereafter to assess for possible partial obstruction.  Warning symptoms reviewed.    [name], MD

## 2018-02-25 NOTE — ED Notes (Signed)
Dr Clearnce Hasten aware of pt

## 2018-02-25 NOTE — Interval H&P Note (Signed)
History and Physical Interval Note:  02/25/2018 7:36 AM  Michael [name]  has presented today for surgery, with the diagnosis of prostate cancer, bilateral hydronephrosis  The various methods of treatment have been discussed with the patient and family. After consideration of risks, benefits and other options for treatment, the patient has consented to  Procedure(s): CYSTOSCOPY WITH STENT REMOVAL VS EXCHANGE (Bilateral) CYSTOSCOPY WITH RETROGRADE PYELOGRAM (Bilateral) as a surgical intervention .  The patient's history has been reviewed, patient examined, no change in status, stable for surgery.  I have reviewed the patient's chart and labs.  Questions were answered to the patient's satisfaction.    RRR CTAB  [name]

## 2018-02-25 NOTE — ED Notes (Addendum)
MD and RN informed urology would like to be paged when patient was in room

## 2018-02-25 NOTE — Anesthesia Post-op Follow-up Note (Signed)
Anesthesia QCDR form completed.        

## 2018-02-26 ENCOUNTER — Other Ambulatory Visit: Payer: Self-pay

## 2018-02-26 DIAGNOSIS — A419 Sepsis, unspecified organism: Secondary | ICD-10-CM

## 2018-02-26 LAB — CBC
HCT: 27.9 % — ABNORMAL LOW (ref 39.0–52.0)
Hemoglobin: 8.9 g/dL — ABNORMAL LOW (ref 13.0–17.0)
MCH: 28.1 pg (ref 26.0–34.0)
MCHC: 31.9 g/dL (ref 30.0–36.0)
MCV: 88 fL (ref 80.0–100.0)
Platelets: 254 10*3/uL (ref 150–400)
RBC: 3.17 MIL/uL — ABNORMAL LOW (ref 4.22–5.81)
RDW: 15.8 % — ABNORMAL HIGH (ref 11.5–15.5)
WBC: 9.7 10*3/uL (ref 4.0–10.5)
nRBC: 0 % (ref 0.0–0.2)

## 2018-02-26 LAB — GLUCOSE, CAPILLARY
Glucose-Capillary: 152 mg/dL — ABNORMAL HIGH (ref 70–99)
Glucose-Capillary: 158 mg/dL — ABNORMAL HIGH (ref 70–99)
Glucose-Capillary: 103 mg/dL — ABNORMAL HIGH (ref 70–99)
Glucose-Capillary: 233 mg/dL — ABNORMAL HIGH (ref 70–99)

## 2018-02-26 LAB — BASIC METABOLIC PANEL
Anion gap: 7 (ref 5–15)
BUN: 22 mg/dL (ref 8–23)
CO2: 20 mmol/L — ABNORMAL LOW (ref 22–32)
Calcium: 7.5 mg/dL — ABNORMAL LOW (ref 8.9–10.3)
Chloride: 106 mmol/L (ref 98–111)
Creatinine, Ser: 1.15 mg/dL (ref 0.61–1.24)
GFR calc Af Amer: >60 mL/min (ref >60)
GFR calc non Af Amer: 59 mL/min — ABNORMAL LOW (ref >60)
Glucose, Bld: 219 mg/dL — ABNORMAL HIGH (ref 70–99)
Potassium: 3.9 mmol/L (ref 3.5–5.1)
Sodium: 133 mmol/L — ABNORMAL LOW (ref 135–145)

## 2018-02-26 MED ORDER — INSULIN GLARGINE 100 UNIT/ML ~~LOC~~ SOLN
10.0000 [IU] | Freq: Every day | SUBCUTANEOUS | Status: DC
  Administered 2018-02-26 – 2018-02-27 (×2): 10 [IU] via SUBCUTANEOUS
  Filled 2018-02-26 (×2): qty 0.1

## 2018-02-26 NOTE — Progress Notes (Signed)
Urology Consult Follow Up  Subjective: Afebrile since admission yesterday.  Labs within normal limits.  Denies flank pain.  Voiding spontaneously.  Initially bloody yesterday but has cleared this morning.  Urine and blood cultures pending.  Antibiotics broadened to meropenem.  Anti-infectives: Anti-infectives (From admission, onward)   Start     Dose/Rate Route Frequency Ordered Stop   02/26/18 0600  meropenem (MERREM) 1 g in sodium chloride 0.9 % 100 mL IVPB     1 g 200 mL/hr over 30 Minutes Intravenous Every 12 hours 02/25/18 1822     02/25/18 1800  meropenem (MERREM) 1 g in sodium chloride 0.9 % 100 mL IVPB     1 g 200 mL/hr over 30 Minutes Intravenous  Once 02/25/18 1750 02/25/18 1916      Current Facility-Administered Medications  Medication Dose Route Frequency Provider Last Rate Last Dose  . acetaminophen (TYLENOL) tablet 650 mg  650 mg Oral Q6H PRN Epifanio Lesches, MD       Or  . acetaminophen (TYLENOL) suppository 650 mg  650 mg Rectal Q6H PRN Epifanio Lesches, MD      . albuterol (PROVENTIL) (2.5 MG/3ML) 0.083% nebulizer solution 2.5 mg  2.5 mg Inhalation Q4H PRN Epifanio Lesches, MD      . atorvastatin (LIPITOR) tablet 10 mg  10 mg Oral q1800 Epifanio Lesches, MD   10 mg at 02/25/18 2204  . bisacodyl (DULCOLAX) EC tablet 5 mg  5 mg Oral Daily PRN Epifanio Lesches, MD      . calcium-vitamin D (OSCAL WITH D) 500-200 MG-UNIT per tablet 1 tablet  1 tablet Oral QPM Epifanio Lesches, MD      . docusate sodium (COLACE) capsule 100 mg  100 mg Oral BID Epifanio Lesches, MD      . enoxaparin (LOVENOX) injection 40 mg  40 mg Subcutaneous Q24H Epifanio Lesches, MD   40 mg at 02/25/18 2204  . enzalutamide (XTANDI) capsule 80 mg  80 mg Oral Daily Epifanio Lesches, MD      . glimepiride (AMARYL) tablet 2 mg  2 mg Oral Q breakfast Epifanio Lesches, MD      . HYDROcodone-acetaminophen (NORCO/VICODIN) 5-325 MG per tablet 1-2 tablet  1-2 tablet Oral Q4H  PRN Epifanio Lesches, MD      . ibuprofen (ADVIL,MOTRIN) tablet 200 mg  200 mg Oral Q6H PRN Epifanio Lesches, MD      . insulin aspart (novoLOG) injection 0-9 Units  0-9 Units Subcutaneous TID WC Epifanio Lesches, MD      . iron polysaccharides (NIFEREX) capsule 150 mg  150 mg Oral Daily Epifanio Lesches, MD      . meropenem (MERREM) 1 g in sodium chloride 0.9 % 100 mL IVPB  1 g Intravenous Q12H Keigo, Whalley, RPH 200 mL/hr at 02/26/18 0522 1 g at 02/26/18 0522  . mirabegron ER (MYRBETRIQ) tablet 25 mg  25 mg Oral Daily Epifanio Lesches, MD      . multivitamin with minerals tablet 1 tablet  1 tablet Oral Daily Epifanio Lesches, MD      . ondansetron (ZOFRAN) tablet 4 mg  4 mg Oral Q6H PRN Epifanio Lesches, MD       Or  . ondansetron (ZOFRAN) injection 4 mg  4 mg Intravenous Q6H PRN Epifanio Lesches, MD      . sodium chloride 0.9 % bolus 500 mL  500 mL Intravenous Once Epifanio Lesches, MD      . traZODone (DESYREL) tablet 25 mg  25 mg Oral QHS PRN  Epifanio Lesches, MD         Objective: Vital signs in last 24 hours: Temp:  [97.5 F (36.4 C)-100.1 F (37.8 C)] 99.1 F (37.3 C) (01/07 0850) Pulse Rate:  [89-115] 114 (01/07 0850) Resp:  [16-20] 18 (01/07 0850) BP: (105-133)/(59-87) 114/61 (01/07 0850) SpO2:  [98 %-100 %] 100 % (01/07 0850) Weight:  [59.7 kg] 59.7 kg (01/06 1654)  Intake/Output from previous day: 01/06 0701 - 01/07 0700 In: 376.5 [I.V.:376.5] Out: -  Intake/Output this shift: No intake/output data recorded.   Physical Exam Constitutional:      Appearance: Normal appearance.  HENT:     Head: Normocephalic.  Pulmonary:     Effort: Pulmonary effort is normal. No respiratory distress.  Abdominal:     General: Abdomen is flat.     Palpations: Abdomen is soft.  Genitourinary:    Comments: No CVA tenderness Neurological:     Mental Status: He is alert.  Psychiatric:        Mood and Affect: Mood normal.       Lab  Results:  Recent Labs    02/25/18 1707 02/26/18 0308  WBC 14.0* 9.7  HGB 9.7* 8.9*  HCT 30.2* 27.9*  PLT 276 254   BMET Recent Labs    02/25/18 1707 02/26/18 0308  NA 130* 133*  K 4.0 3.9  CL 103 106  CO2 16* 20*  GLUCOSE 282* 219*  BUN 27* 22  CREATININE 1.27* 1.15  CALCIUM 7.9* 7.5*   PT/INR Recent Labs    02/25/18 1707  LABPROT 14.6  INR 1.15    Studies/Results: Dg Chest Port 1 View  Result Date: 02/25/2018 CLINICAL DATA:  Sepsis. EXAM: PORTABLE CHEST 1 VIEW COMPARISON:  Radiographs of May 07, 2017. FINDINGS: The heart size and mediastinal contours are within normal limits. Both lungs are clear. The visualized skeletal structures are unremarkable. IMPRESSION: No active disease. Electronically Signed   By: Marijo Conception, M.D.   On: 02/25/2018 18:35    Assessment: 85 year old male admitted with fever/sepsis likely of urinary source following bilateral stent removal yesterday.  Suspect transient bacteremia secondary to GU manipulation in the setting of bacterial colonization.  Clinically improved this morning and afebrile.  Labs within normal limits.  Plan: -No indication for further urologic intervention at this time -Continue broad-spectrum antibiotics and adjust based on culture and sensitivity data -Appropriate for discharge from urological perspective, defer to primary team -Would recommend discharge home with 2 weeks of antibiotics based on culture sensitivities, previous history of ESBL E. Coli -Follow-up with me as planned for renal ultrasound in a few weeks    LOS: 1 day    [name] 02/26/2018

## 2018-02-26 NOTE — Progress Notes (Signed)
Inpatient Diabetes Program Recommendations  AACE/ADA: New Consensus Statement on Inpatient Glycemic Control (2015)  Target Ranges:  Prepandial:   less than 140 mg/dL      Peak postprandial:   less than 180 mg/dL (1-2 hours)      Critically ill patients:  140 - 180 mg/dL   Lab Results  Component Value Date   GLUCAP 158 (H) 02/26/2018   HGBA1C 9.3 04/05/2017    Review of Glycemic Control Results for Michael [name], Michael [name] (MRN [id number]) as of 02/26/2018 12:21  Ref. Range 02/25/2018 06:30 02/25/2018 08:39 02/25/2018 22:47 02/26/2018 07:36 02/26/2018 11:37  Glucose-Capillary Latest Ref Range: 70 - 99 mg/dL 125 (H) 113 (H) 275 (H) 152 (H) 158 (H)   Diabetes history: DM2 Outpatient Diabetes medications: Lantus 15 + Amaryl 2 mg + Metformin 1 gm q pm Current orders for Inpatient glycemic control: Amaryl 2 mg + Novolog sensitive tid  Inpatient Diabetes Program Recommendations:   While in the hospital: -D/C Amaryl -Lantus 8 units qd (50% of home insulin dose)  Thank you, Bethena Roys E. Hoang Pettingill, RN, MSN, CDE  Diabetes Coordinator Inpatient Glycemic Control Team Team Pager [phone] (8am-5pm) 02/26/2018 12:23 PM

## 2018-02-26 NOTE — Progress Notes (Signed)
Patient's daughter at bedside requesting to speak with MD prior to administration of morning medications.

## 2018-02-26 NOTE — Care Management Note (Signed)
Case Management Note  Patient Details  Name: Michael [name] MRN: [id number] Date of Birth: Oct 25, 1933  Subjective/Objective:                 Patient had outpatient  bilateral urethral stent  removal 1/6.  Returned to ED later evening with fever.  He has metastatic prostate cancer. Current with PCP and no issues accessing medical care.  No issues with adls or ambulation. Admitted with sepsis. Urine and blood cultures pending   Action/Plan:   Expected Discharge Date:                  Expected Discharge Plan:     In-House Referral:     Discharge planning Services     Post Acute Care Choice:    Choice offered to:     DME Arranged:    DME Agency:     HH Arranged:    HH Agency:     Status of Service:     If discussed at [employer], dates discussed:    Additional Comments:  Katrina Stack, RN 02/26/2018, 10:58 AM

## 2018-02-26 NOTE — Progress Notes (Signed)
Michael [name] at [place] NAME: Michael [name]    MR#:  [id number]  DATE OF BIRTH:  01-15-1934  SUBJECTIVE:  CHIEF COMPLAINT:   Chief Complaint  Patient presents with  . Fever   Patient had bilateral ureteral stent removal procedure by urologist yesterday and had fever so came to emergency room after that. No complaints today.  Feels much better. REVIEW OF SYSTEMS:  CONSTITUTIONAL: No fever, fatigue or weakness.  EYES: No blurred or double vision.  EARS, NOSE, AND THROAT: No tinnitus or ear pain.  RESPIRATORY: No cough, shortness of breath, wheezing or hemoptysis.  CARDIOVASCULAR: No chest pain, orthopnea, edema.  GASTROINTESTINAL: No nausea, vomiting, diarrhea or abdominal pain.  GENITOURINARY: No dysuria, hematuria.  ENDOCRINE: No polyuria, nocturia,  HEMATOLOGY: No anemia, easy bruising or bleeding SKIN: No rash or lesion. MUSCULOSKELETAL: No joint pain or arthritis.   NEUROLOGIC: No tingling, numbness, weakness.  PSYCHIATRY: No anxiety or depression.   ROS  DRUG ALLERGIES:  No Known Allergies  VITALS:  Blood pressure 127/77, pulse 85, temperature 98.1 F (36.7 C), temperature source Oral, resp. rate 20, height 5\' 6"  (1.676 m), weight 59.7 kg, SpO2 97 %.  PHYSICAL EXAMINATION:  GENERAL:  85 y.o.-year-old patient lying in the bed with no acute distress.  EYES: Pupils equal, round, reactive to light and accommodation. No scleral icterus. Extraocular muscles intact.  HEENT: Head atraumatic, normocephalic. Oropharynx and nasopharynx clear.  NECK:  Supple, no jugular venous distention. No thyroid enlargement, no tenderness.  LUNGS: Normal breath sounds bilaterally, no wheezing, rales,rhonchi or crepitation. No use of accessory muscles of respiration.  CARDIOVASCULAR: S1, S2 normal. No murmurs, rubs, or gallops.  ABDOMEN: Soft, nontender, nondistended. Bowel sounds present. No organomegaly or mass.  EXTREMITIES: No pedal edema,  cyanosis, or clubbing.  NEUROLOGIC: Cranial nerves II through XII are intact. Muscle strength 4/5 in all extremities. Sensation intact. Gait not checked.  PSYCHIATRIC: The patient is alert and oriented x 3.  SKIN: No obvious rash, lesion, or ulcer.   Physical Exam LABORATORY PANEL:   CBC Recent Labs  Lab 02/26/18 0308  WBC 9.7  HGB 8.9*  HCT 27.9*  PLT 254   ------------------------------------------------------------------------------------------------------------------  Chemistries  Recent Labs  Lab 02/25/18 1707 02/26/18 0308  NA 130* 133*  K 4.0 3.9  CL 103 106  CO2 16* 20*  GLUCOSE 282* 219*  BUN 27* 22  CREATININE 1.27* 1.15  CALCIUM 7.9* 7.5*  AST 26  --   ALT 16  --   ALKPHOS 75  --   BILITOT 0.4  --    ------------------------------------------------------------------------------------------------------------------  Cardiac Enzymes No results for input(s): TROPONINI in the last 168 hours. ------------------------------------------------------------------------------------------------------------------  RADIOLOGY:  Dg Chest Port 1 View  Result Date: 02/25/2018 CLINICAL DATA:  Sepsis. EXAM: PORTABLE CHEST 1 VIEW COMPARISON:  Radiographs of May 07, 2017. FINDINGS: The heart size and mediastinal contours are within normal limits. Both lungs are clear. The visualized skeletal structures are unremarkable. IMPRESSION: No active disease. Electronically Signed   By: Marijo Conception, M.D.   On: 02/25/2018 18:35    ASSESSMENT AND PLAN:   Active Problems:   Sepsis ([place])  *Sepsis due to UTI Currently given IV meropenem because of previous history of resistant bacteria. Follow urine culture. Blood cultures are negative so far. Appreciate help by urologist.  *Prostate cancer Appreciate help by urologist, follow in clinic.  *Diabetes mellitus type 2 We will continue home medications and keep on sliding scale coverage.  *  Essential  hypertension Metoprolol.   All the records are reviewed and case discussed with Care Management/Social Workerr. Management plans discussed with the patient, family and they are in agreement.  CODE STATUS: Full  TOTAL TIME TAKING CARE OF THIS PATIENT: 35 minutes.   Discussed with urologist and patient's daughter on the phone. I speak [place], I had communicated with patient about the finding and treatment plan also.  POSSIBLE D/C IN 1-2 DAYS, DEPENDING ON CLINICAL CONDITION.   Vaughan Basta M.D on 02/26/2018   Between 7am to 6pm - Pager - [phone]  After 6pm go to [email or web address] - password [email or web address] [facility]  Office  [phone]  CC: Primary care physician; [name], MD  Note: This dictation was prepared with Dragon dictation along with smaller phrase technology. Any transcriptional errors that result from this process are unintentional.

## 2018-02-27 LAB — URINE CULTURE: Culture: <10000 — AB

## 2018-02-27 LAB — GLUCOSE, CAPILLARY
Glucose-Capillary: 255 mg/dL — ABNORMAL HIGH (ref 70–99)
Glucose-Capillary: 207 mg/dL — ABNORMAL HIGH (ref 70–99)

## 2018-02-27 MED ORDER — AMOXICILLIN-POT CLAVULANATE 875-125 MG PO TABS: 1.0000 | ORAL_TABLET | Freq: Two times a day (BID) | ORAL | 0 refills | Status: AC

## 2018-03-02 LAB — CULTURE, BLOOD (ROUTINE X 2)
Culture: NO GROWTH
Culture: NO GROWTH
Special Requests: ADEQUATE
Special Requests: ADEQUATE

## 2018-03-04 ENCOUNTER — Inpatient Hospital Stay (HOSPITAL_BASED_OUTPATIENT_CLINIC_OR_DEPARTMENT_OTHER): Payer: Medicare Other | Admitting: Internal Medicine

## 2018-03-04 ENCOUNTER — Inpatient Hospital Stay: Payer: Medicare Other

## 2018-03-04 ENCOUNTER — Inpatient Hospital Stay: Payer: Medicare Other | Attending: Internal Medicine

## 2018-03-04 ENCOUNTER — Other Ambulatory Visit: Payer: Self-pay

## 2018-03-04 DIAGNOSIS — E1165 Type 2 diabetes mellitus with hyperglycemia: Secondary | ICD-10-CM | POA: Insufficient documentation

## 2018-03-04 DIAGNOSIS — N1330 Unspecified hydronephrosis: Secondary | ICD-10-CM | POA: Insufficient documentation

## 2018-03-04 DIAGNOSIS — Z79899 Other long term (current) drug therapy: Secondary | ICD-10-CM | POA: Insufficient documentation

## 2018-03-04 DIAGNOSIS — N400 Enlarged prostate without lower urinary tract symptoms: Secondary | ICD-10-CM

## 2018-03-04 DIAGNOSIS — K219 Gastro-esophageal reflux disease without esophagitis: Secondary | ICD-10-CM

## 2018-03-04 DIAGNOSIS — D649 Anemia, unspecified: Secondary | ICD-10-CM | POA: Diagnosis not present

## 2018-03-04 DIAGNOSIS — I6522 Occlusion and stenosis of left carotid artery: Secondary | ICD-10-CM | POA: Insufficient documentation

## 2018-03-04 DIAGNOSIS — C61 Malignant neoplasm of prostate: Secondary | ICD-10-CM | POA: Diagnosis present

## 2018-03-04 DIAGNOSIS — I2510 Atherosclerotic heart disease of native coronary artery without angina pectoris: Secondary | ICD-10-CM | POA: Insufficient documentation

## 2018-03-04 DIAGNOSIS — C785 Secondary malignant neoplasm of large intestine and rectum: Secondary | ICD-10-CM | POA: Insufficient documentation

## 2018-03-04 DIAGNOSIS — E785 Hyperlipidemia, unspecified: Secondary | ICD-10-CM | POA: Insufficient documentation

## 2018-03-04 DIAGNOSIS — C7911 Secondary malignant neoplasm of bladder: Secondary | ICD-10-CM | POA: Insufficient documentation

## 2018-03-04 DIAGNOSIS — Z191 Hormone sensitive malignancy status: Secondary | ICD-10-CM | POA: Insufficient documentation

## 2018-03-04 DIAGNOSIS — I10 Essential (primary) hypertension: Secondary | ICD-10-CM

## 2018-03-04 DIAGNOSIS — Z794 Long term (current) use of insulin: Secondary | ICD-10-CM | POA: Insufficient documentation

## 2018-03-04 DIAGNOSIS — Z79818 Long term (current) use of other agents affecting estrogen receptors and estrogen levels: Secondary | ICD-10-CM

## 2018-03-04 DIAGNOSIS — Z87440 Personal history of urinary (tract) infections: Secondary | ICD-10-CM

## 2018-03-04 DIAGNOSIS — M549 Dorsalgia, unspecified: Secondary | ICD-10-CM | POA: Diagnosis not present

## 2018-03-04 LAB — CBC WITH DIFFERENTIAL/PLATELET
Abs Immature Granulocytes: 0.21 10*3/uL — ABNORMAL HIGH (ref 0.00–0.07)
Basophils Absolute: 0 10*3/uL (ref 0.0–0.1)
Basophils Relative: 0 %
Eosinophils Absolute: 0.2 10*3/uL (ref 0.0–0.5)
Eosinophils Relative: 2 %
HCT: 34.1 % — ABNORMAL LOW (ref 39.0–52.0)
Hemoglobin: 10.9 g/dL — ABNORMAL LOW (ref 13.0–17.0)
Immature Granulocytes: 3 %
Lymphocytes Relative: 23 %
Lymphs Abs: 1.8 10*3/uL (ref 0.7–4.0)
MCH: 27.9 pg (ref 26.0–34.0)
MCHC: 32 g/dL (ref 30.0–36.0)
MCV: 87.2 fL (ref 80.0–100.0)
Monocytes Absolute: 0.7 10*3/uL (ref 0.1–1.0)
Monocytes Relative: 9 %
Neutro Abs: 5.1 10*3/uL (ref 1.7–7.7)
Neutrophils Relative %: 63 %
Platelets: 349 10*3/uL (ref 150–400)
RBC: 3.91 MIL/uL — ABNORMAL LOW (ref 4.22–5.81)
RDW: 15.3 % (ref 11.5–15.5)
WBC: 8 10*3/uL (ref 4.0–10.5)
nRBC: 0 % (ref 0.0–0.2)

## 2018-03-04 LAB — COMPREHENSIVE METABOLIC PANEL
ALT: 20 U/L (ref 0–44)
AST: 22 U/L (ref 15–41)
Albumin: 3.6 g/dL (ref 3.5–5.0)
Alkaline Phosphatase: 83 U/L (ref 38–126)
Anion gap: 10 (ref 5–15)
BUN: 28 mg/dL — ABNORMAL HIGH (ref 8–23)
CO2: 26 mmol/L (ref 22–32)
Calcium: 9.7 mg/dL (ref 8.9–10.3)
Chloride: 100 mmol/L (ref 98–111)
Creatinine, Ser: 1.06 mg/dL (ref 0.61–1.24)
GFR calc Af Amer: >60 mL/min (ref >60)
GFR calc non Af Amer: >60 mL/min (ref >60)
Glucose, Bld: 183 mg/dL — ABNORMAL HIGH (ref 70–99)
Potassium: 4.5 mmol/L (ref 3.5–5.1)
Sodium: 136 mmol/L (ref 135–145)
Total Bilirubin: 0.3 mg/dL (ref 0.3–1.2)
Total Protein: 8.1 g/dL (ref 6.5–8.1)

## 2018-03-04 LAB — PSA: Prostatic Specific Antigen: 0.47 ng/mL (ref 0.00–4.00)

## 2018-03-04 MED ORDER — SODIUM CHLORIDE 0.9 % IV SOLN
510.0000 mg | Freq: Once | INTRAVENOUS | Status: AC
  Administered 2018-03-04: 510 mg via INTRAVENOUS
  Filled 2018-03-04: qty 17

## 2018-03-04 MED ORDER — SODIUM CHLORIDE 0.9 % IV SOLN
Freq: Once | INTRAVENOUS | Status: AC
  Administered 2018-03-04: 11:00:00 via INTRAVENOUS
  Filled 2018-03-04: qty 250

## 2018-03-04 MED ORDER — DENOSUMAB 120 MG/1.7ML ~~LOC~~ SOLN: 120.0000 mg | Freq: Once | SUBCUTANEOUS | Status: DC

## 2018-03-04 MED ORDER — LEUPROLIDE ACETATE (6 MONTH) 45 MG IM KIT
45.0000 mg | PACK | Freq: Once | INTRAMUSCULAR | Status: AC
  Administered 2018-03-04: 45 mg via INTRAMUSCULAR
  Filled 2018-03-04: qty 45

## 2018-03-04 NOTE — Progress Notes (Signed)
[facility] OFFICE PROGRESS NOTE  Patient Care Team: [name], MD as PCP - General (Internal Medicine)  Cancer Staging No matching staging information was found for the patient.   Oncology History   # FEB 2019-Metastatic prostate cancer/ castrate sensitive [bulky retroperitoneal adenopathy; invasion into the bladder; rectum] Bladder Bx- prostate. On Degarelix  # April 3rd 2019- [name] [128md];  Lupron q 6 M [[id number]]; MID AUG 2019- reduced dose to 868mday  # Poorly controlled DM-on insulin  # s/p right PCN [explanted]/ Foley cath [Dr.Brandon]; multiple UTIs.  ----------------------------------------------------------------- DIAGNOSIS: [ FEB 2019] MET. PROSTATE CA  STAGE:   IV ;GOALS: PALLIATIVE  CURRENT/MOST RECENT THERAPY [ Feb-April2019] Lupron+ [name]      [facility]     INTERVAL HISTORY:  GoJANSON LAMAR89.o.  male pleasant patient [name] is here for follow-up.  In the interim patient was admitted to hospital for UTI/after stent exchange.  Is currently home.  Patient is doing well.  Denies any fevers or chills.  Chronic mild joint pain/back pain.  Denies any vision problems.  No nausea no vomiting.  Denies any constipation.  No blood in stools.  Review of Systems  Constitutional: Negative for chills, diaphoresis, fever, malaise/fatigue and weight loss.  HENT: Negative for nosebleeds and sore throat.   Eyes: Negative for double vision.  Respiratory: Negative for cough, hemoptysis, sputum production, shortness of breath and wheezing.   Cardiovascular: Negative for chest pain, palpitations, orthopnea and leg swelling.  Gastrointestinal: Negative for abdominal pain, blood in stool, constipation, diarrhea, heartburn, melena, nausea and vomiting.  Genitourinary: Negative for dysuria, frequency and urgency.  Musculoskeletal: Positive for back pain  and joint pain.  Skin: Negative.  Negative for itching and rash.  Neurological: Negative for dizziness, tingling, focal weakness, weakness and headaches.  Endo/Heme/Allergies: Does not bruise/bleed easily.  Psychiatric/Behavioral: Negative for depression. The patient is not nervous/anxious and does not have insomnia.       PAST MEDICAL HISTORY :  Past Medical History:  Diagnosis Date  . Anemia    iron deficiency  . Benign prostatic hypertrophy   . Bilateral carotid artery disease (HCC)    Mild plaque formation without obstructive disease noted on carotid Doppler.  . Coronary artery disease    [facility]. The patient was told about 2 blockages which did not require revascularization.  . Diabetes mellitus without complication ([facility]  . Essential hypertension   . GERD (gastroesophageal reflux disease)   . Hyperlipidemia   . Hypertension   . Left carotid artery stenosis   . Prostate cancer ([facility]/2019    PAST SURGICAL HISTORY :   Past Surgical History:  Procedure Laterality Date  . CARDIAC CATHETERIZATION  2010  . CARDIAC CATHETERIZATION N/A 02/03/2015   Procedure: Left Heart Cath and Coronary Angiography;  Surgeon: MuWellington HampshireMD;  [facility];  Service: Cardiovascular;  Laterality: N/A;  . CATARACT EXTRACTION Bilateral   . CYSTOSCOPY W/ RETROGRADES Bilateral 04/11/2017   Procedure: CYSTOSCOPY WITH RETROGRADE PYELOGRAM;  Surgeon: [name];  Location: [facility];  Service: Urology;  Laterality: Bilateral;  . CYSTOSCOPY W/ RETROGRADES Bilateral 09/12/2017   Procedure: CYSTOSCOPY WITH RETROGRADE PYELOGRAM;  Surgeon: [name];  Location: [facility];  Service: Urology;  Laterality: Bilateral;  . CYSTOSCOPY W/ RETROGRADES Bilateral 02/25/2018   Procedure: CYSTOSCOPY WITH RETROGRADE PYELOGRAM;  Surgeon: [name];  Location: [facility];  Service: Urology;  Laterality: Bilateral;  . CYSTOSCOPY W/  URETERAL STENT PLACEMENT Bilateral 09/12/2017   Procedure: CYSTOSCOPY WITH STENT REPLACEMENT;  Surgeon: [name], MD;  Location: [facility];  Service: Urology;  Laterality: Bilateral;  . CYSTOSCOPY W/ URETERAL STENT REMOVAL Bilateral 02/25/2018   Procedure: CYSTOSCOPY WITH STENT REMOVAL;  Surgeon: [name], MD;  Location: [facility];  Service: Urology;  Laterality: Bilateral;  . CYSTOSCOPY WITH STENT PLACEMENT Left 04/11/2017   Procedure: CYSTOSCOPY WITH STENT PLACEMENT and fulgeration;  Surgeon: [name], MD;  Location: [facility];  Service: Urology;  Laterality: Left;  . CYSTOSCOPY WITH URETHRAL DILATATION Bilateral 04/11/2017   Procedure: CYSTOSCOPY WITH URETHRAL DILATATION;  Surgeon: [name], MD;  Location: [facility];  Service: Urology;  Laterality: Bilateral;  . IR CONVERT RIGHT NEPHROSTOMY TO NEPHROURETERAL CATH  05/21/2017  . IR NEPHROSTOMY PLACEMENT RIGHT  04/13/2017    FAMILY HISTORY :   Family History  Problem Relation Age of Onset  . Hypertension Father     SOCIAL HISTORY:   Social History   Tobacco Use  . Smoking status: Never Smoker  . Smokeless tobacco: Never Used  Substance Use Topics  . Alcohol use: No  . Drug use: No    ALLERGIES:  has No Known Allergies.  MEDICATIONS:  Current Outpatient Medications  Medication Sig Dispense Refill  . acetaminophen (TYLENOL) 500 MG tablet Take 500 mg by mouth every 4 (four) hours as needed for moderate pain or fever.    [name] albuterol (PROVENTIL HFA;VENTOLIN HFA) 108 (90 Base) MCG/ACT inhaler Inhale 2 puffs into the lungs every 4 (four) hours as needed for wheezing or shortness of breath.    [name] amoxicillin-clavulanate (AUGMENTIN) 875-125 MG tablet Take 1 tablet by mouth 2 (two) times daily for 10 days. 20 tablet 0  . atorvastatin (LIPITOR) 10 MG tablet Take 10 mg by mouth daily at 6 PM.     . Calcium Carb-Cholecalciferol (CALCIUM 600+D3) 600-800 MG-UNIT TABS Take 1 tablet by mouth every evening.     [name] glimepiride  (AMARYL) 2 MG tablet Take 2 mg by mouth daily with breakfast.    . glucose blood (ACCU-CHEK GUIDE) test strip Use asdirected three times a day diag E11.65 100 each 3  . ibuprofen (ADVIL,MOTRIN) 200 MG tablet Take 200 mg by mouth every 6 (six) hours as needed for fever or moderate pain.     [name] insulin glargine (LANTUS) 100 UNIT/ML injection Inject 15 Units into the skin every morning. Based on sugar levels in am.     . iron polysaccharides (NIFEREX) 150 MG capsule Take 150 mg by mouth daily.     . metFORMIN (GLUCOPHAGE) 1000 MG tablet Take 1,000 mg by mouth every evening. With dinner    . metoprolol succinate (TOPROL-XL) 25 MG 24 hr tablet TAKE 1 TABLET BY MOUTH EVERY DAY FOR BLOOD PRESSURE (Patient taking differently: Take 25 mg by mouth every evening. ) 90 tablet 3  . mirabegron ER (MYRBETRIQ) 25 MG TB24 tablet Take 1 tablet (25 mg total) by mouth daily. 30 tablet 11  . Multiple Vitamins-Minerals (MULTIVITAMIN WITH MINERALS) tablet Take 1 tablet by mouth daily.     [name] omeprazole (PRILOSEC) 20 MG capsule TAKE ONE CAPSULE BY MOUTH EVERY DAY (Patient taking differently: Take 20 mg by mouth daily. ) 90 capsule 2  . XTANDI 40 MG capsule TAKE 2 CAPSULES (80 MG TOTAL) BY MOUTH DAILY. (Patient taking differently: Take 80 mg by mouth daily. ) 60 capsule 3  No current facility-administered medications for this visit.    Facility-Administered Medications Ordered in Other Visits  Medication Dose Route Frequency Provider Last Rate Last Dose  . denosumab (XGEVA) injection 120 mg  120 mg Subcutaneous Once [name], MD   Stopped at 03/04/18 1112    PHYSICAL EXAMINATION: ECOG PERFORMANCE STATUS: 1 - Symptomatic but completely ambulatory  There were no vitals taken for this visit.  There were no vitals filed for this visit.  Physical Exam  Constitutional: He is oriented to person, place, and time and well-developed, well-nourished, and in no distress.  Accompanied by his daughter.  HENT:   Head: Normocephalic and atraumatic.  Mouth/Throat: Oropharynx is clear and moist. No oropharyngeal exudate.  Eyes: Pupils are equal, round, and reactive to light.  Neck: Normal range of motion. Neck supple.  Cardiovascular: Normal rate and regular rhythm.  Pulmonary/Chest: No respiratory distress. He has no wheezes.  Abdominal: Soft. Bowel sounds are normal. He exhibits no distension and no mass. There is no abdominal tenderness. There is no rebound and no guarding.  Musculoskeletal: Normal range of motion.        General: No tenderness or edema.  Neurological: He is alert and oriented to person, place, and time.  Skin: Skin is warm.  Psychiatric: Affect normal.       LABORATORY DATA:  I have reviewed the data as listed    Component Value Date/Time   NA 136 03/04/2018 0922   NA 136 05/07/2012 1328   K 4.5 03/04/2018 0922   K 4.5 05/07/2012 1328   CL 100 03/04/2018 0922   CL 104 05/07/2012 1328   CO2 26 03/04/2018 0922   CO2 28 05/07/2012 1328   GLUCOSE 183 (H) 03/04/2018 0922   GLUCOSE 73 05/07/2012 1328   BUN 28 (H) 03/04/2018 0922   BUN 16 05/07/2012 1328   CREATININE 1.06 03/04/2018 0922   CREATININE 0.84 05/07/2012 1328   CALCIUM 9.7 03/04/2018 0922   CALCIUM 8.8 05/07/2012 1328   PROT 8.1 03/04/2018 0922   ALBUMIN 3.6 03/04/2018 0922   AST 22 03/04/2018 0922   ALT 20 03/04/2018 0922   ALKPHOS 83 03/04/2018 0922   BILITOT 0.3 03/04/2018 0922   GFRNONAA >60 03/04/2018 0922   GFRNONAA >60 05/07/2012 1328   GFRAA >60 03/04/2018 0922   GFRAA >60 05/07/2012 1328    No results found for: SPEP, UPEP  Lab Results  Component Value Date   WBC 8.0 03/04/2018   NEUTROABS 5.1 03/04/2018   HGB 10.9 (L) 03/04/2018   HCT 34.1 (L) 03/04/2018   MCV 87.2 03/04/2018   PLT 349 03/04/2018      Chemistry      Component Value Date/Time   NA 136 03/04/2018 0922   NA 136 05/07/2012 1328   K 4.5 03/04/2018 0922   K 4.5 05/07/2012 1328   CL 100 03/04/2018 0922   CL 104  05/07/2012 1328   CO2 26 03/04/2018 0922   CO2 28 05/07/2012 1328   BUN 28 (H) 03/04/2018 0922   BUN 16 05/07/2012 1328   CREATININE 1.06 03/04/2018 0922   CREATININE 0.84 05/07/2012 1328      Component Value Date/Time   CALCIUM 9.7 03/04/2018 0922   CALCIUM 8.8 05/07/2012 1328   ALKPHOS 83 03/04/2018 0922   AST 22 03/04/2018 0922   ALT 20 03/04/2018 0922   BILITOT 0.3 03/04/2018 7253       RADIOGRAPHIC STUDIES: I have personally reviewed the radiological images as listed and  agreed with the findings in the report. No results found.   ASSESSMENT & PLAN:  Prostate cancer [facility]) # Metastatic prostate cancer/ castrate sensitive [bulky retroperitoneal adenopathy; invasion into the bladder; rectum]. Lupron [65mq 631mlast 09/12/2017]; On Xtandi; June 24th 2019 CT scan shows significant improvement of prostate/pelvic soft tissue lesions/retroperitoneal lymphadenopathy; and bilateral hydronephrosis.    #Continue Xtandi 80 mg a day [secondary to fatigue]; PSA from today pending.  Also get a CT scan abdomen pelvis [see discussion below] in 1 month  #[facility]   # Castrate sensitive prostate cancer mets to bone-hold Xgeva for now/hypercalcemia.  #Anemia-iron deficiency unclear etiology.  GU losses versus GI-stool occult positive question artifactual.  Recommend colonoscopy.  Await CT scan abdomen pelvis above.  #Right eye vision disturbance/pain-defer to ophthalmology stable  # DISPOSITION:  # treatment today-Ferrahem-Lupron. HOLD x-geva.   # Follow up in 4 weeks-MD/labs-cbc/cmp/psa;CT scan prior-Dr.B  Cc: Dr. HaGinette Pitman Orders Placed This Encounter  Procedures  . CT Abdomen Pelvis Wo Contrast    Standing Status:   Future    Standing Expiration Date:   03/04/2019    Order Specific Question:   ** REASON FOR EXAM (FREE TEXT)    Answer:   prostate cancer    Order Specific Question:   Preferred imaging location?    Answer:   [facility]    Order  Specific Question:   Is Oral Contrast requested for this exam?    Answer:   Yes, Per Radiology protocol    Order Specific Question:   Radiology Contrast Protocol - do NOT remove file path    Answer:   [email or web address]   All questions were answered. The patient knows to call the clinic with any problems, questions or concerns.      GoCammie SickleMD 03/04/2018 12:59 PM

## 2018-03-04 NOTE — Assessment & Plan Note (Addendum)
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 Lupron [55m q 76m; last 09/12/2017]; On Xtandi; June 24th 2019 CT scan shows significant improvement of prostate/pelvic soft tissue lesions/retroperitoneal lymphadenopathy; and bilateral hydronephrosis.    #Continue Xtandi 80 mg a day [secondary to fatigue]; PSA from today pending.  Also get a CT scan abdomen pelvis [see discussion below] in 1 month  #Fatigue-secondary to [facility]. Stable.   # Castrate sensitive prostate cancer mets to bone-hold Xgeva for now/hypercalcemia.  #Anemia-iron deficiency unclear etiology.  GU losses versus GI-stool occult positive question artifactual.  Recommend colonoscopy.  Await CT scan abdomen pelvis above.  #Right eye vision disturbance/pain-defer to ophthalmology stable  # DISPOSITION:  # treatment today-Ferrahem-Lupron. HOLD x-geva.   # Follow up in 4 weeks-MD/labs-cbc/cmp/psa;CT scan prior-Dr.B  Cc: Dr. Ginette Pitman

## 2018-03-05 ENCOUNTER — Telehealth: Payer: Self-pay | Admitting: Internal Medicine

## 2018-03-05 NOTE — Telephone Encounter (Signed)
Mychart message sent.

## 2018-03-06 NOTE — Discharge Summary (Signed)
[name] NAME: Michael [name]    MR#:  [id number]  DATE OF BIRTH:  1933-06-25  DATE OF ADMISSION:  02/25/2018 ADMITTING PHYSICIAN: Epifanio Lesches, MD  DATE OF DISCHARGE: 02/27/2018  2:28 PM  PRIMARY CARE PHYSICIAN: [name], MD    ADMISSION DIAGNOSIS:  Sepsis ([place]) [A41.9] Sepsis without acute organ dysfunction, due to unspecified organism ([place]) [A41.9]  DISCHARGE DIAGNOSIS:  Active Problems:   Sepsis ([place])   SECONDARY DIAGNOSIS:   Past Medical History:  Diagnosis Date  . Anemia    iron deficiency  . Benign prostatic hypertrophy   . Bilateral carotid artery disease (HCC)    Mild plaque formation without obstructive disease noted on carotid Doppler.  . Coronary artery disease    Previous cardiac catheterization at [facility] in 2010. The patient was told about 2 blockages which did not require revascularization.  . Diabetes mellitus without complication ([place])   . Essential hypertension   . GERD (gastroesophageal reflux disease)   . Hyperlipidemia   . Hypertension   . Left carotid artery stenosis   . Prostate cancer ([place]) 03/2017    HOSPITAL COURSE:    *Sepsis due to UTI Currently given IV meropenem because of previous history of resistant bacteria. Follow urine culture. Blood cultures are negative so far. Appreciate help by urologist.  Ur Cx did not grow anything.  Abx adjusted per Urologist recommendation.  *Prostate cancer Appreciate help by urologist, follow in clinic.  *Diabetes mellitus type 2 We will continue home medications and keep on sliding scale coverage.  *Essential hypertension Metoprolol.  DISCHARGE CONDITIONS:   Stable.  CONSULTS OBTAINED:  Treatment Team:  Billey Co, MD [name], MD  DRUG ALLERGIES:  No Known Allergies  DISCHARGE MEDICATIONS:   Allergies as of 02/27/2018   No Known Allergies     Medication List    STOP taking these  medications   lisinopril 2.5 MG tablet Commonly known as:  PRINIVIL,ZESTRIL     TAKE these medications   acetaminophen 500 MG tablet Commonly known as:  TYLENOL Take 500 mg by mouth every 4 (four) hours as needed for moderate pain or fever.   albuterol 108 (90 Base) MCG/ACT inhaler Commonly known as:  PROVENTIL HFA;VENTOLIN HFA Inhale 2 puffs into the lungs every 4 (four) hours as needed for wheezing or shortness of breath.   amoxicillin-clavulanate 875-125 MG tablet Commonly known as:  AUGMENTIN Take 1 tablet by mouth 2 (two) times daily for 10 days.   atorvastatin 10 MG tablet Commonly known as:  LIPITOR Take 10 mg by mouth daily at 6 PM.   CALCIUM 600+D3 600-800 MG-UNIT Tabs Generic drug:  Calcium Carb-Cholecalciferol Take 1 tablet by mouth every evening.   glimepiride 2 MG tablet Commonly known as:  AMARYL Take 2 mg by mouth daily with breakfast.   glucose blood test strip Commonly known as:  ACCU-CHEK GUIDE Use asdirected three times a day diag E11.65   ibuprofen 200 MG tablet Commonly known as:  ADVIL,MOTRIN Take 200 mg by mouth every 6 (six) hours as needed for fever or moderate pain.   insulin glargine 100 UNIT/ML injection Commonly known as:  LANTUS Inject 15 Units into the skin every morning. Based on sugar levels in am.   iron polysaccharides 150 MG capsule Commonly known as:  NIFEREX Take 150 mg by mouth daily.   metFORMIN 1000 MG tablet Commonly known as:  GLUCOPHAGE Take 1,000 mg by mouth every evening. With  dinner   metoprolol succinate 25 MG 24 hr tablet Commonly known as:  TOPROL-XL TAKE 1 TABLET BY MOUTH EVERY DAY FOR BLOOD PRESSURE What changed:  See the new instructions.   mirabegron ER 25 MG Tb24 tablet Commonly known as:  MYRBETRIQ Take 1 tablet (25 mg total) by mouth daily.   multivitamin with minerals tablet Take 1 tablet by mouth daily.   omeprazole 20 MG capsule Commonly known as:  PRILOSEC TAKE ONE CAPSULE BY MOUTH EVERY DAY    XTANDI 40 MG capsule Generic drug:  enzalutamide TAKE 2 CAPSULES (80 MG TOTAL) BY MOUTH DAILY. What changed:  See the new instructions.        DISCHARGE INSTRUCTIONS:    Follow with PMD. Urologist in 1-2 weeks.  If you experience worsening of your admission symptoms, develop shortness of breath, life threatening emergency, suicidal or homicidal thoughts you must seek medical attention immediately by calling 911 or calling your MD immediately  if symptoms less severe.  You Must read complete instructions/literature along with all the possible adverse reactions/side effects for all the Medicines you take and that have been prescribed to you. Take any new Medicines after you have completely understood and accept all the possible adverse reactions/side effects.   Please note  You were cared for by a hospitalist during your hospital stay. If you have any questions about your discharge medications or the care you received while you were in the hospital after you are discharged, you can call the unit and asked to speak with the hospitalist on call if the hospitalist that took care of you is not available. Once you are discharged, your primary care physician will handle any further medical issues. Please note that NO REFILLS for any discharge medications will be authorized once you are discharged, as it is imperative that you return to your primary care physician (or establish a relationship with a primary care physician if you do not have one) for your aftercare needs so that they can reassess your need for medications and monitor your lab values.    Today   CHIEF COMPLAINT:   Chief Complaint  Patient presents with  . Fever    HISTORY OF PRESENT ILLNESS:  Michael [name]  is a 85 y.o. male with a known history of metastatic prostate cancer, managed by oncology getting chemotherapy underwent bilateral ureteral stent removal by Dr. Erlene Quan today, after discharge patient went home and  started to have fever up to 102 Fahrenheit, brought in because of the same.  Patient had bilateral ureteral stent removed in OR today, seen by urology as well.  Patient denies any chest pain, shortness of breath, dysuria or lightheadedness.   VITAL SIGNS:  Blood pressure 124/71, pulse 99, temperature 98 F (36.7 C), temperature source Oral, resp. rate 16, height 5\' 6"  (1.676 m), weight 58.1 kg, SpO2 98 %.  I/O:  No intake or output data in the 24 hours ending 03/06/18 2028  PHYSICAL EXAMINATION:   GENERAL:  85 y.o.-year-old patient lying in the bed with no acute distress.  EYES: Pupils equal, round, reactive to light and accommodation. No scleral icterus. Extraocular muscles intact.  HEENT: Head atraumatic, normocephalic. Oropharynx and nasopharynx clear.  NECK:  Supple, no jugular venous distention. No thyroid enlargement, no tenderness.  LUNGS: Normal breath sounds bilaterally, no wheezing, rales,rhonchi or crepitation. No use of accessory muscles of respiration.  CARDIOVASCULAR: S1, S2 normal. No murmurs, rubs, or gallops.  ABDOMEN: Soft, nontender, nondistended. Bowel sounds present. No organomegaly or  mass.  EXTREMITIES: No pedal edema, cyanosis, or clubbing.  NEUROLOGIC: Cranial nerves II through XII are intact. Muscle strength 4/5 in all extremities. Sensation intact. Gait not checked.  PSYCHIATRIC: The patient is alert and oriented x 3.  SKIN: No obvious rash, lesion, or ulcer.   DATA REVIEW:   CBC Recent Labs  Lab 03/04/18 0922  WBC 8.0  HGB 10.9*  HCT 34.1*  PLT 349    Chemistries  Recent Labs  Lab 03/04/18 0922  NA 136  K 4.5  CL 100  CO2 26  GLUCOSE 183*  BUN 28*  CREATININE 1.06  CALCIUM 9.7  AST 22  ALT 20  ALKPHOS 83  BILITOT 0.3    Cardiac Enzymes No results for input(s): TROPONINI in the last 168 hours.  Microbiology Results  Results for orders placed or performed during the hospital encounter of 02/25/18  Culture, blood (Routine x 2)      Status: None   Collection Time: 02/25/18  5:07 PM  Result Value Ref Range Status   Specimen Description BLOOD RIGHT ANTECUBITAL  Final   Special Requests   Final    BOTTLES DRAWN AEROBIC AND ANAEROBIC Blood Culture adequate volume   Culture   Final    NO GROWTH 5 DAYS Performed at [facility], [address], [place], [place] [address]    Report Status 03/02/2018 FINAL  Final  Culture, blood (Routine x 2)     Status: None   Collection Time: 02/25/18  5:07 PM  Result Value Ref Range Status   Specimen Description BLOOD BLOOD RIGHT HAND  Final   Special Requests   Final    BOTTLES DRAWN AEROBIC AND ANAEROBIC Blood Culture adequate volume   Culture   Final    NO GROWTH 5 DAYS Performed at [facility], [address], [place], [place] [address]    Report Status 03/02/2018 FINAL  Final  Urine culture     Status: Abnormal   Collection Time: 02/25/18  5:13 PM  Result Value Ref Range Status   Specimen Description   Final    URINE, RANDOM Performed at [facility], [address] [place], [place] [address]    Special Requests   Final    NONE Performed at [facility], [address] [place], [place] [address]    Culture (A)  Final    <10,000 COLONIES/mL INSIGNIFICANT GROWTH Performed at [facility], [place] [address], [place], [place] [address]    Report Status 02/27/2018 FINAL  Final    RADIOLOGY:  No results found.  EKG:   Orders placed or performed during the hospital encounter of 02/11/18  . EKG 12-Lead  . EKG 12-Lead     Management plans discussed with the patient, family and they are in agreement.  CODE STATUS:  Code Status History    Date Active Date Inactive Code Status Order ID Comments User Context   02/25/2018 1806 02/27/2018 1728 Full Code [id number]  Epifanio Lesches, MD ED   05/17/2017 1653 05/19/2017 1440 Full Code [id number]  Demetrios Loll, MD Inpatient   05/07/2017 1124 05/09/2017 1844 Full Code [id number]  Fritzi Mandes, MD Inpatient   04/11/2017 1130 04/16/2017 1643 Full Code [id number]  Loletha Grayer, MD Inpatient   02/03/2015 0925 02/03/2015 1542 Full Code [id number]  Wellington [name], MD Inpatient      TOTAL TIME TAKING CARE OF THIS PATIENT: 35 minutes.    Vaughan Basta M.D on 03/06/2018 at 8:28 PM  Between 7am to 6pm - Pager - [phone]  After 6pm go to [email or web address] - password [email or web address] [place] [facility]  Office  [phone]  CC: Primary care physician; [name], MD   Note: This dictation was prepared with Dragon dictation along with smaller phrase technology. Any transcriptional errors that result from this process are unintentional.

## 2018-03-08 ENCOUNTER — Ambulatory Visit
Admission: RE | Admit: 2018-03-08 | Discharge: 2018-03-08 | Disposition: A | Discharge status: Home / Self Care | Payer: Medicare Other | Source: Ambulatory Visit | Attending: Urology | Admitting: Urology

## 2018-03-08 DIAGNOSIS — C61 Malignant neoplasm of prostate: Secondary | ICD-10-CM

## 2018-03-08 DIAGNOSIS — N1330 Unspecified hydronephrosis: Secondary | ICD-10-CM | POA: Diagnosis present

## 2018-03-12 ENCOUNTER — Ambulatory Visit: Payer: Medicare Other | Admitting: Urology

## 2018-03-13 ENCOUNTER — Ambulatory Visit (INDEPENDENT_AMBULATORY_CARE_PROVIDER_SITE_OTHER): Payer: Medicare Other | Admitting: Urology

## 2018-03-13 ENCOUNTER — Encounter: Payer: Self-pay | Admitting: Urology

## 2018-03-13 VITALS — BP 146/86 | HR 84 | Ht 66.0 in | Wt 132.3 lb

## 2018-03-13 DIAGNOSIS — C61 Malignant neoplasm of prostate: Secondary | ICD-10-CM

## 2018-03-13 DIAGNOSIS — N1330 Unspecified hydronephrosis: Secondary | ICD-10-CM | POA: Diagnosis not present

## 2018-03-13 MED FILL — XTANDI 40 MG CAPSULE: 40 | 30 days supply | Qty: 60 | Fill #1

## 2018-03-13 NOTE — Progress Notes (Signed)
03/13/2018 4:23 PM   Michael [name] 04-09-1933 [id number]  Referring provider: [name], MD [address] [facility] [place], [place] [address]  Chief Complaint  Patient presents with  . Prostate Cancer    HPI: Michael [name] is a 85 yo M with advanced metastatic (castrate sensitive) prostate cancer managed by Dr. Per Monday currently on ADT/Xtandi with recent dose reduction and Michael [name] returns to the office post op cystoscopy with stent removal.   His postop course was complicated by postop fevers, presumably secondary to transient bacteremia although his urine and blood cultures are negative.  Ultimately he clinically improved and was discharged.  He reports of improvement in urinary symptoms. He reports of improvement from urinating every hour to every 2 hours. He is overall pleased with his urinary symptoms.   He denies any flank pain, further fevers or chills.  His most recent imaging in the form of renal ultrasound on 03/08/2018 shows moderate bilateral hydronephrosis.  Most recent labs indicate his creatinine remained stable at 1.06 on 03/04/2018.  Previous history:  He was initially seen and evaluated during inpatient hospital admission in 2/2019with urinary retention, acute kidney injury with bilateral hydronephrosis. He underwent biopsy of his bladder neck revealing prostate cancer and started on ADT. He required surgical intervention for Foley catheter placement given severe bladder neck contracture at which time a left ureteral stent was placed and ultimately a right PCN.Right PCN was later converted to indwelling double-J stent. His Foley was subsequently removed and he self cathing several times a week to keep his bladder neck open (low PVRs).  More recently, he returned to the operating room on 09/12/2017 for bilateral retrograde pyelogram and ultimately stent exchange due to poor excretion of contrast material the time of  retrogrades.  Most recent imaging in the form of CT abdomen pelvis with contrast on 07/2017 shows response to ADT with near complete resolution of metastatic abdominal and pelvic lymphadenopathy.  He has been taking Myrbetriq for OAB symptoms related to stent discomfort.  PMH: Past Medical History:  Diagnosis Date  . Anemia    iron deficiency  . Benign prostatic hypertrophy   . Bilateral carotid artery disease (HCC)    Mild plaque formation without obstructive disease noted on carotid Doppler.  . Coronary artery disease    Previous cardiac catheterization at [facility] in 2010. The patient was told about 2 blockages which did not require revascularization.  . Diabetes mellitus without complication ([place])   . Essential hypertension   . GERD (gastroesophageal reflux disease)   . Hyperlipidemia   . Hypertension   . Left carotid artery stenosis   . Prostate cancer ([place]) 03/2017    Surgical History: Past Surgical History:  Procedure Laterality Date  . CARDIAC CATHETERIZATION  2010  . CARDIAC CATHETERIZATION N/A 02/03/2015   Procedure: Left Heart Cath and Coronary Angiography;  Surgeon: Wellington [name], MD;  Location: [place] [facility];  Service: Cardiovascular;  Laterality: N/A;  . CATARACT EXTRACTION Bilateral   . CYSTOSCOPY W/ RETROGRADES Bilateral 04/11/2017   Procedure: CYSTOSCOPY WITH RETROGRADE PYELOGRAM;  Surgeon: [name], MD;  Location: [facility];  Service: Urology;  Laterality: Bilateral;  . CYSTOSCOPY W/ RETROGRADES Bilateral 09/12/2017   Procedure: CYSTOSCOPY WITH RETROGRADE PYELOGRAM;  Surgeon: [name], MD;  Location: [facility];  Service: Urology;  Laterality: Bilateral;  . CYSTOSCOPY W/ RETROGRADES Bilateral 02/25/2018   Procedure: CYSTOSCOPY WITH RETROGRADE PYELOGRAM;  Surgeon: [name], MD;  Location: [facility];  Service: Urology;  Laterality: Bilateral;  . CYSTOSCOPY W/ URETERAL STENT PLACEMENT Bilateral 09/12/2017   Procedure: CYSTOSCOPY WITH STENT  REPLACEMENT;  Surgeon: [name], MD;  Location: [facility];  Service: Urology;  Laterality: Bilateral;  . CYSTOSCOPY W/ URETERAL STENT REMOVAL Bilateral 02/25/2018   Procedure: CYSTOSCOPY WITH STENT REMOVAL;  Surgeon: [name], MD;  Location: [facility];  Service: Urology;  Laterality: Bilateral;  . CYSTOSCOPY WITH STENT PLACEMENT Left 04/11/2017   Procedure: CYSTOSCOPY WITH STENT PLACEMENT and fulgeration;  Surgeon: [name], MD;  Location: [facility];  Service: Urology;  Laterality: Left;  . CYSTOSCOPY WITH URETHRAL DILATATION Bilateral 04/11/2017   Procedure: CYSTOSCOPY WITH URETHRAL DILATATION;  Surgeon: [name], MD;  Location: [facility];  Service: Urology;  Laterality: Bilateral;  . IR CONVERT RIGHT NEPHROSTOMY TO NEPHROURETERAL CATH  05/21/2017  . IR NEPHROSTOMY PLACEMENT RIGHT  04/13/2017    Home Medications:  Allergies as of 03/13/2018   No Known Allergies     Medication List       Accurate as of March 13, 2018  4:23 PM. Always use your most recent med list.        acetaminophen 500 MG tablet Commonly known as:  TYLENOL Take 500 mg by mouth every 4 (four) hours as needed for moderate pain or fever.   albuterol 108 (90 Base) MCG/ACT inhaler Commonly known as:  PROVENTIL HFA;VENTOLIN HFA Inhale 2 puffs into the lungs every 4 (four) hours as needed for wheezing or shortness of breath.   atorvastatin 10 MG tablet Commonly known as:  LIPITOR Take 10 mg by mouth daily at 6 PM.   BD PEN NEEDLE MICRO U/F 32G X 6 MM Misc Generic drug:  Insulin Pen Needle   CALCIUM 600+D3 600-800 MG-UNIT Tabs Generic drug:  Calcium Carb-Cholecalciferol Take 1 tablet by mouth every evening.   glimepiride 2 MG tablet Commonly known as:  AMARYL Take 2 mg by mouth daily with breakfast.   glucose blood test strip Commonly known as:  ACCU-CHEK GUIDE Use asdirected three times a day diag E11.65   ibuprofen 200 MG tablet Commonly known as:  ADVIL,MOTRIN Take 200 mg by mouth  every 6 (six) hours as needed for fever or moderate pain.   insulin glargine 100 UNIT/ML injection Commonly known as:  LANTUS Inject 15 Units into the skin every morning. Based on sugar levels in am.   iron polysaccharides 150 MG capsule Commonly known as:  NIFEREX Take 150 mg by mouth daily.   metFORMIN 1000 MG tablet Commonly known as:  GLUCOPHAGE Take 1,000 mg by mouth every evening. With dinner   metoprolol succinate 25 MG 24 hr tablet Commonly known as:  TOPROL-XL TAKE 1 TABLET BY MOUTH EVERY DAY FOR BLOOD PRESSURE   mirabegron ER 25 MG Tb24 tablet Commonly known as:  MYRBETRIQ Take 1 tablet (25 mg total) by mouth daily.   multivitamin with minerals tablet Take 1 tablet by mouth daily.   NOVOLOG FLEXPEN 100 UNIT/ML FlexPen Generic drug:  insulin aspart   omeprazole 20 MG capsule Commonly known as:  PRILOSEC TAKE ONE CAPSULE BY MOUTH EVERY DAY   XTANDI 40 MG capsule Generic drug:  enzalutamide TAKE 2 CAPSULES (80 MG TOTAL) BY MOUTH DAILY.       Allergies: No Known Allergies  Family History: Family History  Problem Relation Age of Onset  . Hypertension Father     Social History:  reports that he has never smoked. He has never used smokeless tobacco. He reports that he does not drink  alcohol or use drugs.  ROS: UROLOGY Frequent Urination?: Yes Hard to postpone urination?: No Burning/pain with urination?: No Get up at night to urinate?: No Leakage of urine?: No Urine stream starts and stops?: No Trouble starting stream?: No Do you have to strain to urinate?: No Blood in urine?: No Urinary tract infection?: No Sexually transmitted disease?: No Injury to kidneys or bladder?: No Painful intercourse?: No Weak stream?: No Erection problems?: No Penile pain?: No  Gastrointestinal Nausea?: No Vomiting?: No Indigestion/heartburn?: No Diarrhea?: No Constipation?: No  Constitutional Fever: No Night sweats?: No Weight loss?: No Fatigue?:  No  Skin Skin rash/lesions?: No Itching?: No  Eyes Blurred vision?: No Double vision?: No  Ears/Nose/Throat Sore throat?: No Sinus problems?: No  Hematologic/Lymphatic Swollen glands?: No Easy bruising?: No  Cardiovascular Leg swelling?: No Chest pain?: No  Respiratory Cough?: No Shortness of breath?: No  Endocrine Excessive thirst?: No  Musculoskeletal Back pain?: No Joint pain?: No  Neurological Headaches?: No Dizziness?: No  Psychologic Depression?: No Anxiety?: No  Physical Exam: BP (!) 146/86 (BP Location: Left Arm, Patient Position: Sitting, Cuff Size: Normal)   Pulse 84   Ht 5\' 6"  (1.676 m)   Wt 132 lb 4.8 oz (60 kg)   BMI 21.35 kg/m   Constitutional:  Alert and oriented, No acute distress.  But by daughter today. HEENT: [place] AT, moist mucus membranes.  Trachea midline, no masses. Cardiovascular: No clubbing, cyanosis, or edema. Respiratory: Normal respiratory effort, no increased work of breathing. GU: No CVA tenderness Skin: No rashes, bruises or suspicious lesions. Neurologic: Grossly intact, no focal deficits, moving all 4 extremities. Psychiatric: Normal mood and affect.  Laboratory Data: Component     Latest Ref Rng & Units 04/11/2017 05/04/2017 06/18/2017 08/16/2017  Prostatic Specific Antigen     0.00 - 4.00 ng/mL 9.02 (H) 1.80 0.23 0.12   Component     Latest Ref Rng & Units 09/14/2017 10/15/2017 11/12/2017 02/04/2018  Prostatic Specific Antigen     0.00 - 4.00 ng/mL 0.15 0.17 0.08 0.62   Component     Latest Ref Rng & Units 03/04/2018  Prostatic Specific Antigen     0.00 - 4.00 ng/mL 0.47    Pertinent Imaging: Results for orders placed during the hospital encounter of 03/08/18  US RENAL   Narrative CLINICAL DATA:  Prostate cancer, BILATERAL hydronephrosis  EXAM: RENAL / URINARY TRACT ULTRASOUND COMPLETE  COMPARISON:  CT abdomen and pelvis 08/13/2017  FINDINGS: Right Kidney:  Renal measurements: 8.9 x 4.8 x 3.9 cm = volume:  88 mL. Moderate hydronephrosis. Normal cortical thickness. Slightly increased cortical echogenicity. No definite renal mass or shadowing calcification. Question dilatation of proximal RIGHT ureter. Ureteral stent seen on prior exam not visualized.  Left Kidney:  Renal measurements: 10.0 x 4.7 x 4.5 cm = volume: 110 mL. Cortical thickness normal. Increased cortical echogenicity. Moderate hydronephrosis. Dilatation of proximal LEFT ureter. No renal mass or shadowing calcification. Ureteral stent seen on the prior CT exam no longer identified.  Bladder:  Question minimal wall thickening at the posterior urinary bladder versus dependent debris. Prior CT exam demonstrated bladder wall thickening. No focal mass or calcification. Remainder of bladder unremarkable.  IMPRESSION: BILATERAL moderate hydronephrosis with dilatation of the proximal LEFT and question proximal RIGHT ureter.  Medical renal disease changes of both kidneys.  Bladder wall thickening.   Electronically Signed   By: Lavonia Dana M.D.   On: 03/08/2018 17:36    I have reviewed US renal independently and with pt.  Assessment & Plan:    1. Prostate cancer [facility]) Metastatic castrate sensitive prostate cancer Managed by Dr. Rogue Bussing at the cancer center on Lupron/Xtandi/Xgeva    2. Bilateral hydronephrosis Stent removed on 02/25/2018 RUS shows bilateral moderate hydronephrosis but is otherwise asymptomatic and his creatinine remains normal,?  Chronic hydronephrosis versus partial obstruction He is due for repeat imaging, will follow-up shortly thereafter to reassess both his renal function and overall degree of hydronephrosis on cross-sectional imaging In the interim call warning symptoms of renal failure and urinary obstruction were reviewed in detail, all questions answered Patient and his daughter are agreeable with the plan   Return in about 3 weeks (around 04/03/2018) for f/u CT and Labs with Dr. B at  cancer center .  [facility] [address] [place], [place] [address] [phone]  I, Lucas Mallow, am acting as a scribe for Dr. [name],  I have reviewed the above documentation for accuracy and completeness, and I agree with the above.   [name], MD

## 2018-03-28 ENCOUNTER — Telehealth: Payer: Self-pay | Admitting: *Deleted

## 2018-03-28 ENCOUNTER — Ambulatory Visit: Payer: Medicare Other

## 2018-03-28 NOTE — Telephone Encounter (Signed)
Received phone call from [place] in [place] to clarify if MD would like IV contrast for Ct scan tomorrow. clarified with MD that no iv contrast is needed due to hydronephrosis. Ben in [place] updated on the patient's orders. Also per Per Suezanne Jacquet, pt has not picked up ct oral contrast. I contacted daughter Hina to inform her to pick up oral contrast. She gave verbal understanding and will do so this evening.

## 2018-03-29 ENCOUNTER — Ambulatory Visit
Admission: RE | Admit: 2018-03-29 | Discharge: 2018-03-29 | Disposition: A | Discharge status: Home / Self Care | Payer: Medicare Other | Source: Ambulatory Visit | Attending: Internal Medicine | Admitting: Internal Medicine

## 2018-03-29 DIAGNOSIS — C61 Malignant neoplasm of prostate: Secondary | ICD-10-CM | POA: Insufficient documentation

## 2018-03-30 NOTE — Progress Notes (Incomplete)
04/03/2018  5:03 PM   Michael [name] 1933/09/19 [id number]  Referring provider: [name], [facility] [place], [place] [address]  No chief complaint on file.   HPI: Michael [name] is a 85 y.o. male with advanced metastatic (castrate sensitive) prostate cancer managed by Dr. Per Monday currently on ADT/Xtandi and Xgeve and with bilateral hydronephrosis who presents today for 3 week follow up on bilateral hydronephrosis.  The CT abdomen pelvis without contrast done on 03/29/2018 by Dr. [place] Pink has the impression: Widespread/diffuse osseous metastases, grossly unchanged.  Overall improvement of retroperitoneal nodes, with a residual 9 mm short axis node along the aortic bifurcation.  Mild fullness of the bilateral renal collecting system. Prior ureteral stents have been removed.  Thick-walled bladder.   ***  PMH: Past Medical History:  Diagnosis Date  . Anemia    iron deficiency  . Benign prostatic hypertrophy   . Bilateral carotid artery disease (HCC)    Mild plaque formation without obstructive disease noted on carotid Doppler.  . Coronary artery disease    Previous cardiac catheterization at [facility] in 2010. The patient was told about 2 blockages which did not require revascularization.  . Diabetes mellitus without complication ([place])   . Essential hypertension   . GERD (gastroesophageal reflux disease)   . Hyperlipidemia   . Hypertension   . Left carotid artery stenosis   . Prostate cancer ([place]) 03/2017    Surgical History: Past Surgical History:  Procedure Laterality Date  . CARDIAC CATHETERIZATION  2010  . CARDIAC CATHETERIZATION N/A 02/03/2015   Procedure: Left Heart Cath and Coronary Angiography;  Surgeon: Wellington [name], MD;  Location: [place] [facility];  Service: Cardiovascular;  Laterality: N/A;  . CATARACT EXTRACTION Bilateral   . CYSTOSCOPY W/ RETROGRADES Bilateral 04/11/2017   Procedure: CYSTOSCOPY WITH  RETROGRADE PYELOGRAM;  Surgeon: [name], MD;  Location: [facility];  Service: Urology;  Laterality: Bilateral;  . CYSTOSCOPY W/ RETROGRADES Bilateral 09/12/2017   Procedure: CYSTOSCOPY WITH RETROGRADE PYELOGRAM;  Surgeon: [name], MD;  Location: [facility];  Service: Urology;  Laterality: Bilateral;  . CYSTOSCOPY W/ RETROGRADES Bilateral 02/25/2018   Procedure: CYSTOSCOPY WITH RETROGRADE PYELOGRAM;  Surgeon: [name], MD;  Location: [facility];  Service: Urology;  Laterality: Bilateral;  . CYSTOSCOPY W/ URETERAL STENT PLACEMENT Bilateral 09/12/2017   Procedure: CYSTOSCOPY WITH STENT REPLACEMENT;  Surgeon: [name], MD;  Location: [facility];  Service: Urology;  Laterality: Bilateral;  . CYSTOSCOPY W/ URETERAL STENT REMOVAL Bilateral 02/25/2018   Procedure: CYSTOSCOPY WITH STENT REMOVAL;  Surgeon: [name], MD;  Location: [facility];  Service: Urology;  Laterality: Bilateral;  . CYSTOSCOPY WITH STENT PLACEMENT Left 04/11/2017   Procedure: CYSTOSCOPY WITH STENT PLACEMENT and fulgeration;  Surgeon: [name], MD;  Location: [facility];  Service: Urology;  Laterality: Left;  . CYSTOSCOPY WITH URETHRAL DILATATION Bilateral 04/11/2017   Procedure: CYSTOSCOPY WITH URETHRAL DILATATION;  Surgeon: [name], MD;  Location: [facility];  Service: Urology;  Laterality: Bilateral;  . IR CONVERT RIGHT NEPHROSTOMY TO NEPHROURETERAL CATH  05/21/2017  . IR NEPHROSTOMY PLACEMENT RIGHT  04/13/2017    Home Medications:  Allergies as of 04/03/2018   No Known Allergies     Medication List       Accurate as of March 30, 2018  5:03 PM. Always use your most recent med list.        acetaminophen 500 MG tablet Commonly known as:  TYLENOL Take 500 mg by mouth every  4 (four) hours as needed for moderate pain or fever.   albuterol 108 (90 Base) MCG/ACT inhaler Commonly known as:  PROVENTIL HFA;VENTOLIN HFA Inhale 2 puffs into the lungs every 4 (four) hours as needed for wheezing or  shortness of breath.   atorvastatin 10 MG tablet Commonly known as:  LIPITOR Take 10 mg by mouth daily at 6 PM.   BD PEN NEEDLE MICRO U/F 32G X 6 MM Misc Generic drug:  Insulin Pen Needle   CALCIUM 600+D3 600-800 MG-UNIT Tabs Generic drug:  Calcium Carb-Cholecalciferol Take 1 tablet by mouth every evening.   glimepiride 2 MG tablet Commonly known as:  AMARYL Take 2 mg by mouth daily with breakfast.   glucose blood test strip Commonly known as:  ACCU-CHEK GUIDE Use asdirected three times a day diag E11.65   ibuprofen 200 MG tablet Commonly known as:  ADVIL,MOTRIN Take 200 mg by mouth every 6 (six) hours as needed for fever or moderate pain.   insulin glargine 100 UNIT/ML injection Commonly known as:  LANTUS Inject 15 Units into the skin every morning. Based on sugar levels in am.   iron polysaccharides 150 MG capsule Commonly known as:  NIFEREX Take 150 mg by mouth daily.   metFORMIN 1000 MG tablet Commonly known as:  GLUCOPHAGE Take 1,000 mg by mouth every evening. With dinner   metoprolol succinate 25 MG 24 hr tablet Commonly known as:  TOPROL-XL TAKE 1 TABLET BY MOUTH EVERY DAY FOR BLOOD PRESSURE   mirabegron ER 25 MG Tb24 tablet Commonly known as:  MYRBETRIQ Take 1 tablet (25 mg total) by mouth daily.   multivitamin with minerals tablet Take 1 tablet by mouth daily.   NOVOLOG FLEXPEN 100 UNIT/ML FlexPen Generic drug:  insulin aspart   omeprazole 20 MG capsule Commonly known as:  PRILOSEC TAKE ONE CAPSULE BY MOUTH EVERY DAY   XTANDI 40 MG capsule Generic drug:  enzalutamide TAKE 2 CAPSULES (80 MG TOTAL) BY MOUTH DAILY.       Allergies: No Known Allergies  Family History: Family History  Problem Relation Age of Onset  . Hypertension Father     Social History:  reports that he has never smoked. He has never used smokeless tobacco. He reports that he does not drink alcohol or use drugs.  ROS:                                         Physical Exam: There were no vitals taken for this visit.  Constitutional:  Well nourished. Alert and oriented, No acute distress. {HEENT: [place] AT, moist mucus membranes.  Trachea midline, no masses.} Cardiovascular: No clubbing, cyanosis, or edema. Respiratory: Normal respiratory effort, no increased work of breathing. {GI: Abdomen is soft, non tender, non distended, no abdominal masses. Liver and spleen not palpable.  No hernias appreciated.  Stool sample for occult testing is not indicated.} {GU: No CVA tenderness.  No bladder fullness or masses.  Patient with circumcised/uncircumcised phallus. ***Foreskin easily retracted***  Urethral meatus is patent.  No penile discharge. No penile lesions or rashes. Scrotum without lesions, cysts, rashes and/or edema.  Testicles are located scrotally bilaterally. No masses are appreciated in the testicles. Left and right epididymis are normal.} {Rectal: Patient with normal sphincter tone.  No rectal masses are appreciated. Prostate is approximately *** grams, *** nodules are appreciated.} Skin: No rashes, bruises or suspicious lesions. {Lymph: No cervical  or inguinal adenopathy.} Neurologic: Grossly intact, no focal deficits, moving all 4 extremities. Psychiatric: Normal mood and affect.  Laboratory Data: Lab Results  Component Value Date   WBC 8.0 03/04/2018   HGB 10.9 (L) 03/04/2018   HCT 34.1 (L) 03/04/2018   MCV 87.2 03/04/2018   PLT 349 03/04/2018    Lab Results  Component Value Date   CREATININE 1.06 03/04/2018   Lab Results  Component Value Date   HGBA1C 9.3 04/05/2017   Urinalysis    Component Value Date/Time   COLORURINE YELLOW (A) 02/25/2018 1713   APPEARANCEUR CLOUDY (A) 02/25/2018 1713   APPEARANCEUR Cloudy (A) 01/29/2018 1510   LABSPEC 1.007 02/25/2018 1713   PHURINE 6.0 02/25/2018 1713   GLUCOSEU >=500 (A) 02/25/2018 1713   HGBUR LARGE (A) 02/25/2018 1713   BILIRUBINUR NEGATIVE 02/25/2018 1713   BILIRUBINUR  Negative 01/29/2018 1510   KETONESUR NEGATIVE 02/25/2018 1713   PROTEINUR 30 (A) 02/25/2018 1713   UROBILINOGEN 0.2 04/05/2017 1214   NITRITE NEGATIVE 02/25/2018 1713   LEUKOCYTESUR LARGE (A) 02/25/2018 1713   LEUKOCYTESUR 3+ (A) 01/29/2018 1510    Lab Results  Component Value Date   LABMICR See below: 01/29/2018   WBCUA >30 (H) 01/29/2018   RBCUA >30 (H) 01/29/2018   LABEPIT None seen 01/29/2018   MUCUS Present (A) 01/29/2018   BACTERIA RARE (A) 02/25/2018    Pertinent Imaging: CLINICAL DATA:  Follow-up metastatic prostate cancer  EXAM: CT ABDOMEN AND PELVIS WITHOUT CONTRAST  TECHNIQUE: Multidetector CT imaging of the abdomen and pelvis was performed following the standard protocol without IV contrast.  COMPARISON:  08/13/2017  FINDINGS: Lower chest: Lung bases are essentially clear.  Hepatobiliary: Unenhanced liver is grossly unremarkable.  Gallbladder is unremarkable. No intrahepatic or extrahepatic ductal dilatation.  Pancreas: Within normal limits.  Spleen: Within normal limits.  Adrenals/Urinary Tract: Adrenal glands are within normal limits.  Kidneys are within normal limits.  Mild fullness of the bilateral renal collecting systems. Prior ureteral stents have been removed.  Thick-walled bladder.  Stomach/Bowel: Stomach is within normal limits.  No evidence of bowel obstruction.  Normal appendix (series 2/image 40).  Mild left colonic stool burden.  Vascular/Lymphatic: No evidence of abdominal aortic aneurysm.  Atherosclerotic calcifications of the abdominal aorta and branch vessels.  9 mm short axis node along the aortic bifurcation (series 2/image 50). Mild retroperitoneal stranding with additional small para-aortic nodes.  Reproductive: Prostate is grossly unremarkable.  Other: No abdominopelvic ascites.  Mild fat in the left inguinal canal.  Musculoskeletal: Widespread/diffuse osseous metastases  throughout the visualized axial and appendicular skeleton, grossly unchanged.  IMPRESSION: Widespread/diffuse osseous metastases, grossly unchanged.  Overall improvement of retroperitoneal nodes, with a residual 9 mm short axis node along the aortic bifurcation.  Mild fullness of the bilateral renal collecting system. Prior ureteral stents have been removed.  Thick-walled bladder.   Electronically Signed   By: Julian Hy M.D.   On: 03/29/2018 09:10  ***  Assessment & Plan:    1. Prostate cancer [facility] - Metastatic castrate sensitive prostate cancer - Managed by Dr. Rogue Bussing at the cancer center on Lupron/Xtandi/Xgeva  2. Bilateral hydronephrosis - Stent removed 02/25/2018 - ***  No follow-ups on file.  [facility] [address] [place], [place] [address] [phone]

## 2018-04-01 ENCOUNTER — Inpatient Hospital Stay: Payer: Medicare Other | Attending: Internal Medicine

## 2018-04-01 ENCOUNTER — Inpatient Hospital Stay (HOSPITAL_BASED_OUTPATIENT_CLINIC_OR_DEPARTMENT_OTHER): Payer: Medicare Other | Admitting: Internal Medicine

## 2018-04-01 VITALS — BP 131/81 | HR 82 | Temp 97.1°F | Resp 16 | Wt 133.0 lb

## 2018-04-01 DIAGNOSIS — C61 Malignant neoplasm of prostate: Secondary | ICD-10-CM

## 2018-04-01 DIAGNOSIS — Z79899 Other long term (current) drug therapy: Secondary | ICD-10-CM

## 2018-04-01 DIAGNOSIS — I10 Essential (primary) hypertension: Secondary | ICD-10-CM | POA: Diagnosis not present

## 2018-04-01 DIAGNOSIS — D509 Iron deficiency anemia, unspecified: Secondary | ICD-10-CM

## 2018-04-01 DIAGNOSIS — Z794 Long term (current) use of insulin: Secondary | ICD-10-CM | POA: Diagnosis not present

## 2018-04-01 DIAGNOSIS — E785 Hyperlipidemia, unspecified: Secondary | ICD-10-CM

## 2018-04-01 DIAGNOSIS — C161 Malignant neoplasm of fundus of stomach: Secondary | ICD-10-CM | POA: Diagnosis not present

## 2018-04-01 DIAGNOSIS — C7951 Secondary malignant neoplasm of bone: Secondary | ICD-10-CM

## 2018-04-01 DIAGNOSIS — E1165 Type 2 diabetes mellitus with hyperglycemia: Secondary | ICD-10-CM | POA: Diagnosis not present

## 2018-04-01 DIAGNOSIS — Z791 Long term (current) use of non-steroidal anti-inflammatories (NSAID): Secondary | ICD-10-CM | POA: Insufficient documentation

## 2018-04-01 LAB — COMPREHENSIVE METABOLIC PANEL
ALT: 17 U/L (ref 0–44)
AST: 23 U/L (ref 15–41)
Albumin: 3.7 g/dL (ref 3.5–5.0)
Alkaline Phosphatase: 67 U/L (ref 38–126)
Anion gap: 6 (ref 5–15)
BUN: 28 mg/dL — ABNORMAL HIGH (ref 8–23)
CO2: 26 mmol/L (ref 22–32)
Calcium: 8.9 mg/dL (ref 8.9–10.3)
Chloride: 101 mmol/L (ref 98–111)
Creatinine, Ser: 1.01 mg/dL (ref 0.61–1.24)
GFR calc Af Amer: >60 mL/min (ref >60)
GFR calc non Af Amer: >60 mL/min (ref >60)
Glucose, Bld: 135 mg/dL — ABNORMAL HIGH (ref 70–99)
Potassium: 4 mmol/L (ref 3.5–5.1)
Sodium: 133 mmol/L — ABNORMAL LOW (ref 135–145)
Total Bilirubin: 0.4 mg/dL (ref 0.3–1.2)
Total Protein: 7.5 g/dL (ref 6.5–8.1)

## 2018-04-01 LAB — CBC WITH DIFFERENTIAL/PLATELET
Abs Immature Granulocytes: 0.04 10*3/uL (ref 0.00–0.07)
Basophils Absolute: 0 10*3/uL (ref 0.0–0.1)
Basophils Relative: 0 %
Eosinophils Absolute: 0.1 10*3/uL (ref 0.0–0.5)
Eosinophils Relative: 3 %
HCT: 33.9 % — ABNORMAL LOW (ref 39.0–52.0)
Hemoglobin: 11.1 g/dL — ABNORMAL LOW (ref 13.0–17.0)
Immature Granulocytes: 1 %
Lymphocytes Relative: 22 %
Lymphs Abs: 1.1 10*3/uL (ref 0.7–4.0)
MCH: 29.2 pg (ref 26.0–34.0)
MCHC: 32.7 g/dL (ref 30.0–36.0)
MCV: 89.2 fL (ref 80.0–100.0)
Monocytes Absolute: 0.9 10*3/uL (ref 0.1–1.0)
Monocytes Relative: 19 %
Neutro Abs: 2.7 10*3/uL (ref 1.7–7.7)
Neutrophils Relative %: 55 %
Platelets: 240 10*3/uL (ref 150–400)
RBC: 3.8 MIL/uL — ABNORMAL LOW (ref 4.22–5.81)
RDW: 17 % — ABNORMAL HIGH (ref 11.5–15.5)
WBC: 4.9 10*3/uL (ref 4.0–10.5)
nRBC: 0 % (ref 0.0–0.2)

## 2018-04-01 LAB — PSA: Prostatic Specific Antigen: 0.4 ng/mL (ref 0.00–4.00)

## 2018-04-01 NOTE — Assessment & Plan Note (Addendum)
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 Lupron [39m q 66m; last 03/04/2018; On Xtandi; February 2020-CT scan shows continued significant improvement of the retroperitoneal adenopathy; bladder/rectal invasion.;  Diffuse diffuse osseous metastatic disease stable.  #Continue Xtandi 80 mg a day [secondary to fatigue]; PSA from today pending.  PSA slightly going up.  Monitor for now especially CAT scan shows stable disease.  # Fatigue-secondary to [employer]	[place].  Stable.   # prostate cancer mets to bone-; change to zometa every 3 months/sec to hypocalcemia.    #Anemia-iron deficiency unclear etiology.  Status post IV iron improved.  Hold of coloscopy-family request.  # DISPOSITION:  # Zometa in 1 week.  # Follow up in 4  weeks-MD/labs-cbc/cmp/psa;-Dr.B  # I reviewed the blood work- with the patient in detail; also reviewed the imaging independently [as summarized above]; and with the patient in detail.    Cc: Dr. Ginette Pitman

## 2018-04-01 NOTE — Progress Notes (Signed)
[place] OFFICE PROGRESS NOTE  Patient Care Team: [name], MD as PCP - General (Internal Medicine)  Cancer Staging No matching staging information was found for the patient.   Oncology History   # FEB 2019-Metastatic prostate cancer/ castrate sensitive [bulky retroperitoneal adenopathy; invasion into the bladder; rectum] Bladder Bx- prostate. On Degarelix  # April 3rd 2019- [name] [154md];  Lupron q 6 M [[id number]]; MID AUG 2019- reduced dose to '80mg'$ /day  # Poorly controlled DM-on insulin  # s/p right PCN [explanted]/ Foley cath [Dr.Brandon]; multiple UTIs.  ----------------------------------------------------------------- DIAGNOSIS: [ FEB 2019] MET. PROSTATE CA  STAGE:   IV ;GOALS: PALLIATIVE  CURRENT/MOST RECENT THERAPY [ Feb-April2019] Lupron+ [name]      Prostate cancer ([facility]      INTERVAL HISTORY:  Michael [id number].  male pleasant patient [name] is here for follow-up/CT scan A/P.  In the interim patient was admitted to hospital for UTI/stent exchange.  Currently no further chills.  Appetite is good.  Continues to have chronic mild pain in his right shoulder.  No vision problems.  No nausea vomiting or constipation no blood in stools. Mild fatigue.  Review of Systems  Constitutional: Positive for malaise/fatigue. Negative for chills, diaphoresis, fever and weight loss.  HENT: Negative for nosebleeds and sore throat.   Eyes: Negative for double vision.  Respiratory: Negative for cough, hemoptysis, sputum production, shortness of breath and wheezing.   Cardiovascular: Negative for chest pain, palpitations, orthopnea and leg swelling.  Gastrointestinal: Negative for abdominal pain, blood in stool, constipation, diarrhea, heartburn, melena, nausea and vomiting.  Genitourinary: Negative for dysuria, frequency and urgency.  Musculoskeletal: Positive for  back pain and joint pain.  Skin: Negative.  Negative for itching and rash.  Neurological: Negative for dizziness, tingling, focal weakness, weakness and headaches.  Endo/Heme/Allergies: Does not bruise/bleed easily.  Psychiatric/Behavioral: Negative for depression. The patient is not nervous/anxious and does not have insomnia.       PAST MEDICAL HISTORY :  Past Medical History:  Diagnosis Date  . Anemia    iron deficiency  . Benign prostatic hypertrophy   . Bilateral carotid artery disease (HCC)    Mild plaque formation without obstructive disease noted on carotid Doppler.  . Coronary artery disease    [facility] The patient was told about 2 blockages which did not require revascularization.  . [place]   . Essential hypertension   . GERD (gastroesophageal reflux disease)   . Hyperlipidemia   . Hypertension   . Left carotid artery stenosis   . [place]    PAST SURGICAL HISTORY :   Past Surgical History:  Procedure Laterality Date  . CARDIAC CATHETERIZATION  2010  . CARDIAC CATHETERIZATION N/A 02/03/2015   Procedure: Left Heart Cath and Coronary Angiography;  Surgeon: MWellington [name] MD;  [facility];  Service: Cardiovascular;  Laterality: N/A;  . CATARACT EXTRACTION Bilateral   . CYSTOSCOPY W/ RETROGRADES Bilateral 04/11/2017   Procedure: CYSTOSCOPY WITH RETROGRADE PYELOGRAM;  Surgeon: BHollice Espy MD;  Location: [facility];  Service: Urology;  Laterality: Bilateral;  . CYSTOSCOPY W/ RETROGRADES Bilateral 09/12/2017   Procedure: CYSTOSCOPY WITH RETROGRADE PYELOGRAM;  Surgeon: BHollice Espy MD;  Location: [facility];  Service: Urology;  Laterality: Bilateral;  . CYSTOSCOPY W/ RETROGRADES Bilateral 02/25/2018   Procedure: CYSTOSCOPY WITH RETROGRADE PYELOGRAM;  Surgeon: BHollice Espy MD;  Location:  [facility];  Service: Urology;  Laterality: Bilateral;  . CYSTOSCOPY  W/ URETERAL STENT PLACEMENT Bilateral 09/12/2017   Procedure: CYSTOSCOPY WITH STENT REPLACEMENT;  Surgeon: [name], MD;  Location: [facility];  Service: Urology;  Laterality: Bilateral;  . CYSTOSCOPY W/ URETERAL STENT REMOVAL Bilateral 02/25/2018   Procedure: CYSTOSCOPY WITH STENT REMOVAL;  Surgeon: [name], MD;  Location: [facility];  Service: Urology;  Laterality: Bilateral;  . CYSTOSCOPY WITH STENT PLACEMENT Left 04/11/2017   Procedure: CYSTOSCOPY WITH STENT PLACEMENT and fulgeration;  Surgeon: [name], MD;  Location: [facility];  Service: Urology;  Laterality: Left;  . CYSTOSCOPY WITH URETHRAL DILATATION Bilateral 04/11/2017   Procedure: CYSTOSCOPY WITH URETHRAL DILATATION;  Surgeon: [name], MD;  Location: [facility];  Service: Urology;  Laterality: Bilateral;  . IR CONVERT RIGHT NEPHROSTOMY TO NEPHROURETERAL CATH  05/21/2017  . IR NEPHROSTOMY PLACEMENT RIGHT  04/13/2017    FAMILY HISTORY :   Family History  Problem Relation Age of Onset  . Hypertension Father     SOCIAL HISTORY:   Social History   Tobacco Use  . Smoking status: Never Smoker  . Smokeless tobacco: Never Used  Substance Use Topics  . Alcohol use: No  . Drug use: No    ALLERGIES:  has No Known Allergies.  MEDICATIONS:  Current Outpatient Medications  Medication Sig Dispense Refill  . acetaminophen (TYLENOL) 500 MG tablet Take 500 mg by mouth every 4 (four) hours as needed for moderate pain or fever.    [name] albuterol (PROVENTIL HFA;VENTOLIN HFA) 108 (90 Base) MCG/ACT inhaler Inhale 2 puffs into the lungs every 4 (four) hours as needed for wheezing or shortness of breath.    [name] atorvastatin (LIPITOR) 10 MG tablet Take 10 mg by mouth daily at 6 PM.     . BD PEN NEEDLE MICRO U/F 32G X 6 MM MISC     . Calcium Carb-Cholecalciferol (CALCIUM 600+D3) 600-800 MG-UNIT TABS Take 1 tablet by mouth every evening.     [name] glimepiride (AMARYL) 2 MG tablet Take 2 mg by mouth daily with breakfast.    . glucose  blood (ACCU-CHEK GUIDE) test strip Use asdirected three times a day diag E11.65 100 each 3  . ibuprofen (ADVIL,MOTRIN) 200 MG tablet Take 200 mg by mouth every 6 (six) hours as needed for fever or moderate pain.     [name] insulin glargine (LANTUS) 100 UNIT/ML injection Inject 15 Units into the skin every morning. Based on sugar levels in am.     . iron polysaccharides (NIFEREX) 150 MG capsule Take 150 mg by mouth daily.     . metFORMIN (GLUCOPHAGE) 1000 MG tablet Take 1,000 mg by mouth every evening. With dinner    . metoprolol succinate (TOPROL-XL) 25 MG 24 hr tablet TAKE 1 TABLET BY MOUTH EVERY DAY FOR BLOOD PRESSURE (Patient taking differently: Take 25 mg by mouth every evening. ) 90 tablet 3  . mirabegron ER (MYRBETRIQ) 25 MG TB24 tablet Take 1 tablet (25 mg total) by mouth daily. 30 tablet 11  . Multiple Vitamins-Minerals (MULTIVITAMIN WITH MINERALS) tablet Take 1 tablet by mouth daily.     [name] NOVOLOG FLEXPEN 100 UNIT/ML FlexPen     . omeprazole (PRILOSEC) 20 MG capsule TAKE ONE CAPSULE BY MOUTH EVERY DAY (Patient taking differently: Take 20 mg by mouth daily. ) 90 capsule 2  . XTANDI 40 MG capsule TAKE 2 CAPSULES (80 MG TOTAL) BY MOUTH DAILY. (Patient taking differently: Take 80 mg by mouth daily. ) 60 capsule  3   No current facility-administered medications for this visit.     PHYSICAL EXAMINATION: ECOG PERFORMANCE STATUS: 1 - Symptomatic but completely ambulatory  BP 131/81 (BP Location: Left Arm, Patient Position: Sitting, Cuff Size: Normal)   Pulse 82   Temp (!) 97.1 F (36.2 C) (Tympanic)   Resp 16   Wt 133 lb (60.3 kg)   BMI 21.47 kg/m   Filed Weights   04/01/18 0951  Weight: 133 lb (60.3 kg)    Physical Exam  Constitutional: He is oriented to person, place, and time and well-developed, well-nourished, and in no distress.  Accompanied by his daughter.  HENT:  Head: Normocephalic and atraumatic.  Mouth/Throat: Oropharynx is clear and moist. No oropharyngeal exudate.  Eyes:  Pupils are equal, round, and reactive to light.  Neck: Normal range of motion. Neck supple.  Cardiovascular: Normal rate and regular rhythm.  Pulmonary/Chest: No respiratory distress. He has no wheezes.  Abdominal: Soft. Bowel sounds are normal. He exhibits no distension and no mass. There is no abdominal tenderness. There is no rebound and no guarding.  Musculoskeletal: Normal range of motion.        General: No tenderness or edema.  Neurological: He is alert and oriented to person, place, and time.  Skin: Skin is warm.  Psychiatric: Affect normal.       LABORATORY DATA:  I have reviewed the data as listed    Component Value Date/Time   NA 133 (L) 04/01/2018 0928   NA 136 05/07/2012 1328   K 4.0 04/01/2018 0928   K 4.5 05/07/2012 1328   CL 101 04/01/2018 0928   CL 104 05/07/2012 1328   CO2 26 04/01/2018 0928   CO2 28 05/07/2012 1328   GLUCOSE 135 (H) 04/01/2018 0928   GLUCOSE 73 05/07/2012 1328   BUN 28 (H) 04/01/2018 0928   BUN 16 05/07/2012 1328   CREATININE 1.01 04/01/2018 0928   CREATININE 0.84 05/07/2012 1328   CALCIUM 8.9 04/01/2018 0928   CALCIUM 8.8 05/07/2012 1328   PROT 7.5 04/01/2018 0928   ALBUMIN 3.7 04/01/2018 0928   AST 23 04/01/2018 0928   ALT 17 04/01/2018 0928   ALKPHOS 67 04/01/2018 0928   BILITOT 0.4 04/01/2018 0928   GFRNONAA >60 04/01/2018 0928   GFRNONAA >60 05/07/2012 1328   GFRAA >60 04/01/2018 0928   GFRAA >60 05/07/2012 1328    No results found for: SPEP, UPEP  Lab Results  Component Value Date   WBC 4.9 04/01/2018   NEUTROABS 2.7 04/01/2018   HGB 11.1 (L) 04/01/2018   HCT 33.9 (L) 04/01/2018   MCV 89.2 04/01/2018   PLT 240 04/01/2018      Chemistry      Component Value Date/Time   NA 133 (L) 04/01/2018 0928   NA 136 05/07/2012 1328   K 4.0 04/01/2018 0928   K 4.5 05/07/2012 1328   CL 101 04/01/2018 0928   CL 104 05/07/2012 1328   CO2 26 04/01/2018 0928   CO2 28 05/07/2012 1328   BUN 28 (H) 04/01/2018 0928   BUN 16  05/07/2012 1328   CREATININE 1.01 04/01/2018 0928   CREATININE 0.84 05/07/2012 1328      Component Value Date/Time   CALCIUM 8.9 04/01/2018 0928   CALCIUM 8.8 05/07/2012 1328   ALKPHOS 67 04/01/2018 0928   AST 23 04/01/2018 0928   ALT 17 04/01/2018 0928   BILITOT 0.4 04/01/2018 0928       RADIOGRAPHIC STUDIES: I have personally reviewed the  radiological images as listed and agreed with the findings in the report. No results found.   ASSESSMENT & PLAN:  Prostate cancer [facility]) # Metastatic prostate cancer/ castrate sensitive [bulky retroperitoneal adenopathy; invasion into the bladder; rectum]. Lupron [42mq 669mlast 03/04/2018; On Xtandi; February 2020-CT scan shows continued significant improvement of the retroperitoneal adenopathy; bladder/rectal invasion.;  Diffuse diffuse osseous metastatic disease stable.  #Continue Xtandi 80 mg a day [secondary to fatigue]; PSA from today pending.  PSA slightly going up.  Monitor for now especially CAT scan shows stable disease.  # Fatigue-secondary to XtKB Home	[place] Stable.   # prostate cancer mets to bone-; change to zometa every 3 months/sec to hypocalcemia.    #Anemia-iron deficiency unclear etiology.  Status post IV iron improved.  Hold of coloscopy-family request.  # DISPOSITION:  # Zometa in 1 week.  # Follow up in 4  weeks-MD/labs-cbc/cmp/psa;-Dr.B  # I reviewed the blood work- with the patient in detail; also reviewed the imaging independently [as summarized above]; and with the patient in detail.    Cc: Dr. HaGinette Pitman No orders of the defined types were placed in this encounter.  All questions were answered. The patient knows to call the clinic with any problems, questions or concerns.      GoCammie SickleMD 04/01/2018 12:47 PM

## 2018-04-03 ENCOUNTER — Ambulatory Visit: Payer: Medicare Other | Admitting: Urology

## 2018-04-05 ENCOUNTER — Ambulatory Visit (INDEPENDENT_AMBULATORY_CARE_PROVIDER_SITE_OTHER): Payer: Medicare Other | Admitting: Urology

## 2018-04-05 ENCOUNTER — Encounter: Payer: Self-pay | Admitting: Urology

## 2018-04-05 VITALS — BP 146/70 | HR 87 | Ht 66.0 in | Wt 135.0 lb

## 2018-04-05 DIAGNOSIS — N1330 Unspecified hydronephrosis: Secondary | ICD-10-CM

## 2018-04-05 DIAGNOSIS — C61 Malignant neoplasm of prostate: Secondary | ICD-10-CM

## 2018-04-05 DIAGNOSIS — R350 Frequency of micturition: Secondary | ICD-10-CM

## 2018-04-05 DIAGNOSIS — N320 Bladder-neck obstruction: Secondary | ICD-10-CM

## 2018-04-08 ENCOUNTER — Inpatient Hospital Stay: Payer: Medicare Other

## 2018-04-08 VITALS — BP 124/86 | HR 74 | Temp 95.7°F | Resp 18

## 2018-04-08 DIAGNOSIS — C61 Malignant neoplasm of prostate: Secondary | ICD-10-CM

## 2018-04-08 DIAGNOSIS — C7951 Secondary malignant neoplasm of bone: Secondary | ICD-10-CM | POA: Diagnosis not present

## 2018-04-08 MED ORDER — SODIUM CHLORIDE 0.9 % IV SOLN
Freq: Once | INTRAVENOUS | Status: AC
  Administered 2018-04-08: 09:00:00 via INTRAVENOUS
  Filled 2018-04-08: qty 250

## 2018-04-08 MED ORDER — ZOLEDRONIC ACID 4 MG/5ML IV CONC
3.3000 mg | Freq: Once | INTRAVENOUS | Status: AC
  Administered 2018-04-08: 3.3 mg via INTRAVENOUS
  Filled 2018-04-08: qty 4.13

## 2018-04-08 MED FILL — XTANDI 40 MG CAPSULE: 40 | 30 days supply | Qty: 60 | Fill #2

## 2018-04-11 NOTE — Progress Notes (Signed)
04/05/2018 9:20 AM   Michael [name] [birth year] [id number]  Referring provider: [name], MD [address] [facility] [place], [place] [address]  Chief Complaint  Patient presents with  . Prostate Cancer    3wk follow up    HPI: 85 year old male with advanced metastatic prostate cancer managed by Dr. Oneita Jolly on ADT/Xtandi and [place] See he returns today to the office for reevaluation after bilateral ureteral stent removal on 02/25/2018.  His creatinine is remained normal.  He denies any flank pain.  Most recent imaging in the form of CT scan on 03/29/2018 shows bilateral renal pelvic fullness without overt hydroureteronephrosis.  Overall, he is feeling very well.  His irritative voiding symptoms are improving.  He continues on Myrbetriq 50.  He is self cathing a couple times a week without difficulty to keep his bladder neck open.  Please see previous notes for details.  Interested in traveling to [place] this summer and wondering if this would be a reasonable idea.   PMH: Past Medical History:  Diagnosis Date  . Anemia    iron deficiency  . Benign prostatic hypertrophy   . Bilateral carotid artery disease (HCC)    Mild plaque formation without obstructive disease noted on carotid Doppler.  . Coronary artery disease    Previous cardiac catheterization at [facility] in 2010. The patient was told about 2 blockages which did not require revascularization.  . Diabetes mellitus without complication ([place])   . Essential hypertension   . GERD (gastroesophageal reflux disease)   . Hyperlipidemia   . Hypertension   . Left carotid artery stenosis   . Prostate cancer ([place]) 03/2017    Surgical History: Past Surgical History:  Procedure Laterality Date  . CARDIAC CATHETERIZATION  2010  . CARDIAC CATHETERIZATION N/A 02/03/2015   Procedure: Left Heart Cath and Coronary Angiography;  Surgeon: Wellington [name], MD;  Location: [place] [facility];  Service: Cardiovascular;   Laterality: N/A;  . CATARACT EXTRACTION Bilateral   . CYSTOSCOPY W/ RETROGRADES Bilateral 04/11/2017   Procedure: CYSTOSCOPY WITH RETROGRADE PYELOGRAM;  Surgeon: [name], MD;  Location: [facility];  Service: Urology;  Laterality: Bilateral;  . CYSTOSCOPY W/ RETROGRADES Bilateral 09/12/2017   Procedure: CYSTOSCOPY WITH RETROGRADE PYELOGRAM;  Surgeon: [name], MD;  Location: [facility];  Service: Urology;  Laterality: Bilateral;  . CYSTOSCOPY W/ RETROGRADES Bilateral 02/25/2018   Procedure: CYSTOSCOPY WITH RETROGRADE PYELOGRAM;  Surgeon: [name], MD;  Location: [facility];  Service: Urology;  Laterality: Bilateral;  . CYSTOSCOPY W/ URETERAL STENT PLACEMENT Bilateral 09/12/2017   Procedure: CYSTOSCOPY WITH STENT REPLACEMENT;  Surgeon: [name], MD;  Location: [facility];  Service: Urology;  Laterality: Bilateral;  . CYSTOSCOPY W/ URETERAL STENT REMOVAL Bilateral 02/25/2018   Procedure: CYSTOSCOPY WITH STENT REMOVAL;  Surgeon: [name], MD;  Location: [facility];  Service: Urology;  Laterality: Bilateral;  . CYSTOSCOPY WITH STENT PLACEMENT Left 04/11/2017   Procedure: CYSTOSCOPY WITH STENT PLACEMENT and fulgeration;  Surgeon: [name], MD;  Location: [facility];  Service: Urology;  Laterality: Left;  . CYSTOSCOPY WITH URETHRAL DILATATION Bilateral 04/11/2017   Procedure: CYSTOSCOPY WITH URETHRAL DILATATION;  Surgeon: [name], MD;  Location: [facility];  Service: Urology;  Laterality: Bilateral;  . IR CONVERT RIGHT NEPHROSTOMY TO NEPHROURETERAL CATH  05/21/2017  . IR NEPHROSTOMY PLACEMENT RIGHT  04/13/2017    Home Medications:  Allergies as of 04/05/2018   No Known Allergies     Medication List       Accurate as  of April 05, 2018 11:59 PM. Always use your most recent med list.        acetaminophen 500 MG tablet Commonly known as:  TYLENOL Take 500 mg by mouth every 4 (four) hours as needed for moderate pain or fever.   albuterol 108 (90 Base) MCG/ACT  inhaler Commonly known as:  PROVENTIL HFA;VENTOLIN HFA Inhale 2 puffs into the lungs every 4 (four) hours as needed for wheezing or shortness of breath.   atorvastatin 10 MG tablet Commonly known as:  LIPITOR Take 10 mg by mouth daily at 6 PM.   BD PEN NEEDLE MICRO U/F 32G X 6 MM Misc Generic drug:  Insulin Pen Needle   CALCIUM 600+D3 600-800 MG-UNIT Tabs Generic drug:  Calcium Carb-Cholecalciferol Take 1 tablet by mouth every evening.   glimepiride 2 MG tablet Commonly known as:  AMARYL Take 2 mg by mouth daily with breakfast.   glucose blood test strip Commonly known as:  ACCU-CHEK GUIDE Use asdirected three times a day diag E11.65   ibuprofen 200 MG tablet Commonly known as:  ADVIL,MOTRIN Take 200 mg by mouth every 6 (six) hours as needed for fever or moderate pain.   insulin glargine 100 UNIT/ML injection Commonly known as:  LANTUS Inject 15 Units into the skin every morning. Based on sugar levels in am.   iron polysaccharides 150 MG capsule Commonly known as:  NIFEREX Take 150 mg by mouth daily.   metFORMIN 1000 MG tablet Commonly known as:  GLUCOPHAGE Take 1,000 mg by mouth every evening. With dinner   metoprolol succinate 25 MG 24 hr tablet Commonly known as:  TOPROL-XL TAKE 1 TABLET BY MOUTH EVERY DAY FOR BLOOD PRESSURE   mirabegron ER 25 MG Tb24 tablet Commonly known as:  MYRBETRIQ Take 1 tablet (25 mg total) by mouth daily.   multivitamin with minerals tablet Take 1 tablet by mouth daily.   omeprazole 20 MG capsule Commonly known as:  PRILOSEC TAKE ONE CAPSULE BY MOUTH EVERY DAY   XTANDI 40 MG capsule Generic drug:  enzalutamide TAKE 2 CAPSULES (80 MG TOTAL) BY MOUTH DAILY.       Allergies: No Known Allergies  Family History: Family History  Problem Relation Age of Onset  . Hypertension Father     Social History:  reports that he has never smoked. He has never used smokeless tobacco. He reports that he does not drink alcohol or use  drugs.  ROS: UROLOGY Frequent Urination?: Yes Hard to postpone urination?: No Burning/pain with urination?: No Get up at night to urinate?: No Leakage of urine?: No Urine stream starts and stops?: No Trouble starting stream?: No Do you have to strain to urinate?: No Blood in urine?: No Urinary tract infection?: No Sexually transmitted disease?: No Injury to kidneys or bladder?: No Painful intercourse?: No Weak stream?: No Erection problems?: No Penile pain?: No  Gastrointestinal Nausea?: No Vomiting?: No Indigestion/heartburn?: No Diarrhea?: No Constipation?: No  Constitutional Fever: No Night sweats?: No Weight loss?: No Fatigue?: No  Skin Skin rash/lesions?: No Itching?: No  Eyes Blurred vision?: No Double vision?: No  Ears/Nose/Throat Sore throat?: No Sinus problems?: No  Hematologic/Lymphatic Swollen glands?: No Easy bruising?: No  Cardiovascular Leg swelling?: No Chest pain?: No  Respiratory Cough?: No Shortness of breath?: No  Endocrine Excessive thirst?: No  Musculoskeletal Back pain?: No Joint pain?: No  Neurological Headaches?: No Dizziness?: No  Psychologic Depression?: No Anxiety?: No  Physical Exam: BP (!) 146/70   Pulse 87   Ht  5\' 6"  (1.676 m)   Wt 135 lb (61.2 kg)   BMI 21.79 kg/m   Constitutional:  Alert and oriented, No acute distress.  Accompanied by daughter today. HEENT: [place] AT, moist mucus membranes.  Trachea midline, no masses. Cardiovascular: No clubbing, cyanosis, or edema. Respiratory: Normal respiratory effort, no increased work of breathing. GI: No CVA tenderness bilaterally Skin: No rashes, bruises or suspicious lesions. Neurologic: Grossly intact, no focal deficits, moving all 4 extremities. Psychiatric: Normal mood and affect.  Laboratory Data: Lab Results  Component Value Date   WBC 4.9 04/01/2018   HGB 11.1 (L) 04/01/2018   HCT 33.9 (L) 04/01/2018   MCV 89.2 04/01/2018   PLT 240 04/01/2018     Lab Results  Component Value Date   CREATININE 1.01 04/01/2018    Lab Results  Component Value Date   HGBA1C 9.3 04/05/2017   PSA 0.4 on 04/01/2018  Pertinent Imaging: CLINICAL DATA:  Follow-up metastatic prostate cancer  EXAM: CT ABDOMEN AND PELVIS WITHOUT CONTRAST  TECHNIQUE: Multidetector CT imaging of the abdomen and pelvis was performed following the standard protocol without IV contrast.  COMPARISON:  08/13/2017  FINDINGS: Lower chest: Lung bases are essentially clear.  Hepatobiliary: Unenhanced liver is grossly unremarkable.  Gallbladder is unremarkable. No intrahepatic or extrahepatic ductal dilatation.  Pancreas: Within normal limits.  Spleen: Within normal limits.  Adrenals/Urinary Tract: Adrenal glands are within normal limits.  Kidneys are within normal limits.  Mild fullness of the bilateral renal collecting systems. Prior ureteral stents have been removed.  Thick-walled bladder.  Stomach/Bowel: Stomach is within normal limits.  No evidence of bowel obstruction.  Normal appendix (series 2/image 40).  Mild left colonic stool burden.  Vascular/Lymphatic: No evidence of abdominal aortic aneurysm.  Atherosclerotic calcifications of the abdominal aorta and branch vessels.  9 mm short axis node along the aortic bifurcation (series 2/image 50). Mild retroperitoneal stranding with additional small para-aortic nodes.  Reproductive: Prostate is grossly unremarkable.  Other: No abdominopelvic ascites.  Mild fat in the left inguinal canal.  Musculoskeletal: Widespread/diffuse osseous metastases throughout the visualized axial and appendicular skeleton, grossly unchanged.  IMPRESSION: Widespread/diffuse osseous metastases, grossly unchanged.  Overall improvement of retroperitoneal nodes, with a residual 9 mm short axis node along the aortic bifurcation.  Mild fullness of the bilateral renal collecting system.  Prior ureteral stents have been removed.  Thick-walled bladder.   Electronically Signed   By: Julian Hy M.D.   On: 03/29/2018 09:10  CT scan imaging was personally reviewed today as well as with the patient and his daughter.  Agree with radiologic interpretation, no overt hydronephrosis although there is persistent renal pelvic fullness, likely chronic.  Assessment & Plan:    1. Prostate cancer [facility]) Managed by Dr. Rogue Bussing at the Cancer center Currently on ADT/Xtandi/Xgeva, well-tolerated with excellent response  2. Bilateral hydronephrosis Status post bilateral ureteral stent removal Most recent cross-sectional imaging in the form of CT scan shows no overt hydronephrosis other is chronic renal pelvic fullness Creatinine is normal No indication for placement of stents We will continue to monitor creatinine and interval imaging, there is evidence of recurrent or progressive disease, reassess need for placement of stents Patient and daughter agreeable this plan  3. 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 Urinary frequency Continue  Myrbetriq 50 mg, improving  At this point time, from urologic standpoint see no reason why he cannot travel to [place] for a month.  He should continue self cath while there.   Return in about 6 months (around 10/04/2018) for recheck prostate cancer.  [name], MD  [facility] [address] [place], [place] [address] [phone]

## 2018-04-17 ENCOUNTER — Other Ambulatory Visit: Payer: Self-pay | Admitting: Internal Medicine

## 2018-04-29 ENCOUNTER — Other Ambulatory Visit: Payer: Self-pay

## 2018-04-29 ENCOUNTER — Inpatient Hospital Stay (HOSPITAL_BASED_OUTPATIENT_CLINIC_OR_DEPARTMENT_OTHER): Payer: Medicare Other | Admitting: Internal Medicine

## 2018-04-29 ENCOUNTER — Inpatient Hospital Stay: Payer: Medicare Other | Attending: Internal Medicine

## 2018-04-29 ENCOUNTER — Encounter: Payer: Self-pay | Admitting: Internal Medicine

## 2018-04-29 VITALS — BP 123/79 | HR 76 | Temp 97.6°F | Resp 18 | Ht 66.0 in | Wt 135.8 lb

## 2018-04-29 DIAGNOSIS — Z87442 Personal history of urinary calculi: Secondary | ICD-10-CM

## 2018-04-29 DIAGNOSIS — M791 Myalgia, unspecified site: Secondary | ICD-10-CM

## 2018-04-29 DIAGNOSIS — Z794 Long term (current) use of insulin: Secondary | ICD-10-CM | POA: Diagnosis not present

## 2018-04-29 DIAGNOSIS — E1165 Type 2 diabetes mellitus with hyperglycemia: Secondary | ICD-10-CM

## 2018-04-29 DIAGNOSIS — I10 Essential (primary) hypertension: Secondary | ICD-10-CM | POA: Insufficient documentation

## 2018-04-29 DIAGNOSIS — N400 Enlarged prostate without lower urinary tract symptoms: Secondary | ICD-10-CM | POA: Insufficient documentation

## 2018-04-29 DIAGNOSIS — M2550 Pain in unspecified joint: Secondary | ICD-10-CM | POA: Insufficient documentation

## 2018-04-29 DIAGNOSIS — D649 Anemia, unspecified: Secondary | ICD-10-CM

## 2018-04-29 DIAGNOSIS — Z191 Hormone sensitive malignancy status: Secondary | ICD-10-CM

## 2018-04-29 DIAGNOSIS — C774 Secondary and unspecified malignant neoplasm of inguinal and lower limb lymph nodes: Secondary | ICD-10-CM

## 2018-04-29 DIAGNOSIS — E785 Hyperlipidemia, unspecified: Secondary | ICD-10-CM | POA: Diagnosis not present

## 2018-04-29 DIAGNOSIS — C61 Malignant neoplasm of prostate: Secondary | ICD-10-CM

## 2018-04-29 DIAGNOSIS — D509 Iron deficiency anemia, unspecified: Secondary | ICD-10-CM | POA: Insufficient documentation

## 2018-04-29 DIAGNOSIS — I2510 Atherosclerotic heart disease of native coronary artery without angina pectoris: Secondary | ICD-10-CM | POA: Insufficient documentation

## 2018-04-29 DIAGNOSIS — K219 Gastro-esophageal reflux disease without esophagitis: Secondary | ICD-10-CM

## 2018-04-29 DIAGNOSIS — Z79899 Other long term (current) drug therapy: Secondary | ICD-10-CM | POA: Diagnosis not present

## 2018-04-29 LAB — COMPREHENSIVE METABOLIC PANEL
ALT: 13 U/L (ref 0–44)
AST: 18 U/L (ref 15–41)
Albumin: 3.7 g/dL (ref 3.5–5.0)
Alkaline Phosphatase: 70 U/L (ref 38–126)
Anion gap: 9 (ref 5–15)
BUN: 14 mg/dL (ref 8–23)
CO2: 24 mmol/L (ref 22–32)
Calcium: 9 mg/dL (ref 8.9–10.3)
Chloride: 99 mmol/L (ref 98–111)
Creatinine, Ser: 1.04 mg/dL (ref 0.61–1.24)
GFR calc Af Amer: >60 mL/min (ref >60)
GFR calc non Af Amer: >60 mL/min (ref >60)
Glucose, Bld: 148 mg/dL — ABNORMAL HIGH (ref 70–99)
Potassium: 3.6 mmol/L (ref 3.5–5.1)
Sodium: 132 mmol/L — ABNORMAL LOW (ref 135–145)
Total Bilirubin: 0.4 mg/dL (ref 0.3–1.2)
Total Protein: 8.4 g/dL — ABNORMAL HIGH (ref 6.5–8.1)

## 2018-04-29 LAB — CBC WITH DIFFERENTIAL/PLATELET
Abs Immature Granulocytes: 0.07 10*3/uL (ref 0.00–0.07)
Basophils Absolute: 0 10*3/uL (ref 0.0–0.1)
Basophils Relative: 0 %
Eosinophils Absolute: 0.1 10*3/uL (ref 0.0–0.5)
Eosinophils Relative: 1 %
HCT: 34.3 % — ABNORMAL LOW (ref 39.0–52.0)
Hemoglobin: 11.5 g/dL — ABNORMAL LOW (ref 13.0–17.0)
Immature Granulocytes: 1 %
Lymphocytes Relative: 22 %
Lymphs Abs: 1.4 10*3/uL (ref 0.7–4.0)
MCH: 30.3 pg (ref 26.0–34.0)
MCHC: 33.5 g/dL (ref 30.0–36.0)
MCV: 90.3 fL (ref 80.0–100.0)
Monocytes Absolute: 0.9 10*3/uL (ref 0.1–1.0)
Monocytes Relative: 14 %
Neutro Abs: 3.9 10*3/uL (ref 1.7–7.7)
Neutrophils Relative %: 62 %
Platelets: 391 10*3/uL (ref 150–400)
RBC: 3.8 MIL/uL — ABNORMAL LOW (ref 4.22–5.81)
RDW: 14.7 % (ref 11.5–15.5)
WBC: 6.3 10*3/uL (ref 4.0–10.5)
nRBC: 0 % (ref 0.0–0.2)

## 2018-04-29 LAB — PSA: Prostatic Specific Antigen: 0.3 ng/mL (ref 0.00–4.00)

## 2018-04-29 NOTE — Assessment & Plan Note (Addendum)
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 Lupron [38m q 41m; last 03/04/2018; On Xtandi; February 2020-CT scan shows continued significant improvement of the retroperitoneal adenopathy; bladder/rectal invasion.;  Diffuse diffuse osseous metastatic disease- STABLE.   #Continue Xtandi 80 mg a day [secondary to fatigue]; PSA from today pending. feb PSA- stable/ 0.4.   #Fatigue-grade 1-2 stable secondary to [place].  Monitor for now.   # prostate cancer mets to bone-; poor tolerance because of joint pains bone pain.  Plan Zometa every 6 months [last 2/21].  Discussed the role of Zometa in castrate resistant prostate cancer/osteoporosis.  #Anemia-iron deficiency-hemoglobin 11.5 improving.  # DISPOSITION:  # Follow up in 6  weeks-MD/labs-cbc/cmp/psa;-Dr.B  Cc: Dr. Ginette Pitman

## 2018-04-29 NOTE — Progress Notes (Signed)
[facility] OFFICE PROGRESS NOTE  Patient Care Team: [name], MD as PCP - General (Internal Medicine)  Cancer Staging No matching staging information was found for the patient.   Oncology History   # FEB 2019-Metastatic prostate cancer/ castrate sensitive [bulky retroperitoneal adenopathy; invasion into the bladder; rectum] Bladder Bx- prostate. On Degarelix  # April 3rd 2019- [name] [145md];  Lupron q 6 M [[id number]]; MID AUG 2019- reduced dose to 827mday  # Poorly controlled DM-on insulin  # s/p right PCN [explanted]/ Foley cath [Dr.Brandon]; multiple UTIs.  ----------------------------------------------------------------- DIAGNOSIS: [ FEB 2019] MET. PROSTATE CA  STAGE:   IV ;GOALS: PALLIATIVE  CURRENT/MOST RECENT THERAPY [ Feb-April2019] Lupron+ [name]      [facility]     INTERVAL HISTORY:  Michael AIRD27.o.  male pleasant patient [name] is here for follow-up  Patient noted to have significant joint pains muscle pain post Zometa.  Symptoms improved after 3 to 4 days.  Currently resolved.  Appetite is good.  No weight loss.  Complains of continued fatigue.  No shortness of breath or cough.  No fevers or chills.  No difficulty urination.  No falls.   Review of Systems  Constitutional: Positive for malaise/fatigue. Negative for chills, diaphoresis, fever and weight loss.  HENT: Negative for nosebleeds and sore throat.   Eyes: Negative for double vision.  Respiratory: Negative for cough, hemoptysis, sputum production, shortness of breath and wheezing.   Cardiovascular: Negative for chest pain, palpitations, orthopnea and leg swelling.  Gastrointestinal: Negative for abdominal pain, blood in stool, constipation, diarrhea, heartburn, melena, nausea and vomiting.  Genitourinary: Negative for dysuria, frequency and urgency.  Musculoskeletal:  Positive for back pain and joint pain.  Skin: Negative.  Negative for itching and rash.  Neurological: Negative for dizziness, tingling, focal weakness, weakness and headaches.  Endo/Heme/Allergies: Does not bruise/bleed easily.  Psychiatric/Behavioral: Negative for depression. The patient is not nervous/anxious and does not have insomnia.       PAST MEDICAL HISTORY :  Past Medical History:  Diagnosis Date  . Anemia    iron deficiency  . Benign prostatic hypertrophy   . Bilateral carotid artery disease (HCC)    Mild plaque formation without obstructive disease noted on carotid Doppler.  . Coronary artery disease    [facility] The patient was told about 2 blockages which did not require revascularization.  . Diabetes mellitus without complication ([facility]  . Essential hypertension   . GERD (gastroesophageal reflux disease)   . Hyperlipidemia   . Hypertension   . Left carotid artery stenosis   . Prostate cancer ([facility]2019    PAST SURGICAL HISTORY :   Past Surgical History:  Procedure Laterality Date  . CARDIAC CATHETERIZATION  2010  . CARDIAC CATHETERIZATION N/A 02/03/2015   Procedure: Left Heart Cath and Coronary Angiography;  Surgeon: MuWellington HampshireMD;  [facility];  Service: Cardiovascular;  Laterality: N/A;  . CATARACT EXTRACTION Bilateral   . CYSTOSCOPY W/ RETROGRADES Bilateral 04/11/2017   Procedure: CYSTOSCOPY WITH RETROGRADE PYELOGRAM;  Surgeon: [name];  Location: [facility];  Service: Urology;  Laterality: Bilateral;  . CYSTOSCOPY W/ RETROGRADES Bilateral 09/12/2017   Procedure: CYSTOSCOPY WITH RETROGRADE PYELOGRAM;  Surgeon: [name];  Location: [facility];  Service: Urology;  Laterality: Bilateral;  . CYSTOSCOPY W/ RETROGRADES Bilateral 02/25/2018   Procedure: CYSTOSCOPY WITH RETROGRADE PYELOGRAM;  Surgeon: [name], MD;  Location: [facility];  Service: Urology;  Laterality: Bilateral;   . CYSTOSCOPY W/ URETERAL STENT PLACEMENT Bilateral 09/12/2017   Procedure: CYSTOSCOPY WITH STENT REPLACEMENT;  Surgeon: [name], MD;  Location: [facility];  Service: Urology;  Laterality: Bilateral;  . CYSTOSCOPY W/ URETERAL STENT REMOVAL Bilateral 02/25/2018   Procedure: CYSTOSCOPY WITH STENT REMOVAL;  Surgeon: [name], MD;  Location: [facility];  Service: Urology;  Laterality: Bilateral;  . CYSTOSCOPY WITH STENT PLACEMENT Left 04/11/2017   Procedure: CYSTOSCOPY WITH STENT PLACEMENT and fulgeration;  Surgeon: [name], MD;  Location: [facility];  Service: Urology;  Laterality: Left;  . CYSTOSCOPY WITH URETHRAL DILATATION Bilateral 04/11/2017   Procedure: CYSTOSCOPY WITH URETHRAL DILATATION;  Surgeon: [name], MD;  Location: [facility];  Service: Urology;  Laterality: Bilateral;  . IR CONVERT RIGHT NEPHROSTOMY TO NEPHROURETERAL CATH  05/21/2017  . IR NEPHROSTOMY PLACEMENT RIGHT  04/13/2017    FAMILY HISTORY :   Family History  Problem Relation Age of Onset  . Hypertension Father     SOCIAL HISTORY:   Social History   Tobacco Use  . Smoking status: Never Smoker  . Smokeless tobacco: Never Used  Substance Use Topics  . Alcohol use: No  . Drug use: No    ALLERGIES:  has No Known Allergies.  MEDICATIONS:  Current Outpatient Medications  Medication Sig Dispense Refill  . acetaminophen (TYLENOL) 500 MG tablet Take 500 mg by mouth every 4 (four) hours as needed for moderate pain or fever.    [name] albuterol (PROVENTIL HFA;VENTOLIN HFA) 108 (90 Base) MCG/ACT inhaler Inhale 2 puffs into the lungs every 4 (four) hours as needed for wheezing or shortness of breath.    [name] atorvastatin (LIPITOR) 10 MG tablet Take 10 mg by mouth daily at 6 PM.     . BD PEN NEEDLE MICRO U/F 32G X 6 MM MISC     . Calcium Carb-Cholecalciferol (CALCIUM 600+D3) 600-800 MG-UNIT TABS Take 1 tablet by mouth every evening.     [name] glimepiride (AMARYL) 2 MG tablet Take 2 mg by mouth daily with breakfast.     . glucose blood (ACCU-CHEK GUIDE) test strip Use asdirected three times a day diag E11.65 100 each 3  . ibuprofen (ADVIL,MOTRIN) 200 MG tablet Take 200 mg by mouth every 6 (six) hours as needed for fever or moderate pain.     [name] insulin glargine (LANTUS) 100 UNIT/ML injection Inject 15 Units into the skin every morning. Based on sugar levels in am.     . iron polysaccharides (NIFEREX) 150 MG capsule Take 150 mg by mouth daily.     . metFORMIN (GLUCOPHAGE) 1000 MG tablet Take 1,000 mg by mouth every evening. With dinner    . metoprolol succinate (TOPROL-XL) 25 MG 24 hr tablet TAKE 1 TABLET BY MOUTH EVERY DAY FOR BLOOD PRESSURE (Patient taking differently: Take 25 mg by mouth every evening. ) 90 tablet 3  . mirabegron ER (MYRBETRIQ) 25 MG TB24 tablet Take 1 tablet (25 mg total) by mouth daily. 30 tablet 11  . Multiple Vitamins-Minerals (MULTIVITAMIN WITH MINERALS) tablet Take 1 tablet by mouth daily.     [name] omeprazole (PRILOSEC) 20 MG capsule TAKE ONE CAPSULE BY MOUTH EVERY DAY (Patient taking differently: Take 20 mg by mouth daily. ) 90 capsule 2  . XTANDI 40 MG capsule TAKE 2 CAPSULES (80 MG TOTAL) BY MOUTH DAILY. (Patient taking differently: Take 80 mg by mouth daily. ) 60 capsule 3   No  current facility-administered medications for this visit.     PHYSICAL EXAMINATION: ECOG PERFORMANCE STATUS: 1 - Symptomatic but completely ambulatory  BP 123/79 (BP Location: Left Arm, Patient Position: Sitting)   Pulse 76   Temp 97.6 F (36.4 C) (Tympanic)   Resp 18   Ht '5\' 6"'  (1.676 m)   Wt 135 lb 12.8 oz (61.6 kg)   BMI 21.92 kg/m   Filed Weights   04/29/18 0958  Weight: 135 lb 12.8 oz (61.6 kg)    Physical Exam  Constitutional: He is oriented to person, place, and time and well-developed, well-nourished, and in no distress.  Accompanied by his daughter.  HENT:  Head: Normocephalic and atraumatic.  Mouth/Throat: Oropharynx is clear and moist. No oropharyngeal exudate.  Eyes: Pupils are  equal, round, and reactive to light.  Neck: Normal range of motion. Neck supple.  Cardiovascular: Normal rate and regular rhythm.  Pulmonary/Chest: No respiratory distress. He has no wheezes.  Abdominal: Soft. Bowel sounds are normal. He exhibits no distension and no mass. There is no abdominal tenderness. There is no rebound and no guarding.  Musculoskeletal: Normal range of motion.        General: No tenderness or edema.  Neurological: He is alert and oriented to person, place, and time.  Skin: Skin is warm.  Psychiatric: Affect normal.       LABORATORY DATA:  I have reviewed the data as listed    Component Value Date/Time   NA 132 (L) 04/29/2018 0935   NA 136 05/07/2012 1328   K 3.6 04/29/2018 0935   K 4.5 05/07/2012 1328   CL 99 04/29/2018 0935   CL 104 05/07/2012 1328   CO2 24 04/29/2018 0935   CO2 28 05/07/2012 1328   GLUCOSE 148 (H) 04/29/2018 0935   GLUCOSE 73 05/07/2012 1328   BUN 14 04/29/2018 0935   BUN 16 05/07/2012 1328   CREATININE 1.04 04/29/2018 0935   CREATININE 0.84 05/07/2012 1328   CALCIUM 9.0 04/29/2018 0935   CALCIUM 8.8 05/07/2012 1328   PROT 8.4 (H) 04/29/2018 0935   ALBUMIN 3.7 04/29/2018 0935   AST 18 04/29/2018 0935   ALT 13 04/29/2018 0935   ALKPHOS 70 04/29/2018 0935   BILITOT 0.4 04/29/2018 0935   GFRNONAA >60 04/29/2018 0935   GFRNONAA >60 05/07/2012 1328   GFRAA >60 04/29/2018 0935   GFRAA >60 05/07/2012 1328    No results found for: SPEP, UPEP  Lab Results  Component Value Date   WBC 6.3 04/29/2018   NEUTROABS 3.9 04/29/2018   HGB 11.5 (L) 04/29/2018   HCT 34.3 (L) 04/29/2018   MCV 90.3 04/29/2018   PLT 391 04/29/2018      Chemistry      Component Value Date/Time   NA 132 (L) 04/29/2018 0935   NA 136 05/07/2012 1328   K 3.6 04/29/2018 0935   K 4.5 05/07/2012 1328   CL 99 04/29/2018 0935   CL 104 05/07/2012 1328   CO2 24 04/29/2018 0935   CO2 28 05/07/2012 1328   BUN 14 04/29/2018 0935   BUN 16 05/07/2012 1328    CREATININE 1.04 04/29/2018 0935   CREATININE 0.84 05/07/2012 1328      Component Value Date/Time   CALCIUM 9.0 04/29/2018 0935   CALCIUM 8.8 05/07/2012 1328   ALKPHOS 70 04/29/2018 0935   AST 18 04/29/2018 0935   ALT 13 04/29/2018 0935   BILITOT 0.4 04/29/2018 0935       RADIOGRAPHIC STUDIES: I have personally  reviewed the radiological images as listed and agreed with the findings in the report. No results found.   ASSESSMENT & PLAN:  Prostate cancer [facility]) # Metastatic prostate cancer/ castrate sensitive [bulky retroperitoneal adenopathy; invasion into the bladder; rectum]. Lupron [71mq 641mlast 03/04/2018; On Xtandi; February 2020-CT scan shows continued significant improvement of the retroperitoneal adenopathy; bladder/rectal invasion.;  Diffuse diffuse osseous metastatic disease- STABLE.   #Continue Xtandi 80 mg a day [secondary to fatigue]; PSA from today pending. feb PSA- stable/ 0.4.   #Fatigue-grade 1-2 stable secondary to X[place]Fort McDermitt Monitor for now.   # prostate cancer mets to bone-; poor tolerance because of joint pains bone pain.  Plan Zometa every 6 months [last 2/21].  Discussed the role of Zometa in castrate resistant prostate cancer/osteoporosis.  #Anemia-iron deficiency-hemoglobin 11.5 improving.  # DISPOSITION:  # Follow up in 6  weeks-MD/labs-cbc/cmp/psa;-Dr.B  Cc: Dr. HaGinette Pitman Orders Placed This Encounter  Procedures  . CBC with Differential/Platelet    Standing Status:   Future    Standing Expiration Date:   04/29/2019  . Comprehensive metabolic panel    Standing Status:   Future    Standing Expiration Date:   04/29/2019  . PSA    Standing Status:   Future    Standing Expiration Date:   04/29/2019   All questions were answered. The patient knows to call the clinic with any problems, questions or concerns.      GoCammie SickleMD 04/29/2018 1:00 PM

## 2018-05-07 MED FILL — XTANDI 40 MG CAPSULE: 40 | 30 days supply | Qty: 60 | Fill #3

## 2018-05-30 ENCOUNTER — Other Ambulatory Visit: Payer: Self-pay | Admitting: Internal Medicine

## 2018-05-30 DIAGNOSIS — C61 Malignant neoplasm of prostate: Secondary | ICD-10-CM

## 2018-05-30 NOTE — Telephone Encounter (Signed)
CBC with Differential  Order: [id number]  Status:  Final result  Visible to patient:  Yes (MyChart)  Next appt:  06/10/2018 at 09:00 AM in Oncology ([facility])  Dx:  Prostate cancer ([place])   Ref Range & Units 23mo ago (04/29/18) 45mo ago (04/01/18) 37mo ago (03/04/18) 39mo ago (02/26/18) 19mo ago (02/25/18) 16mo ago (02/04/18) 63mo ago (01/07/18)  WBC 4.0 - 10.5 K/uL 6.3  4.9  8.0  9.7  14.0High   6.8  5.9   RBC 4.22 - 5.81 MIL/uL 3.80Low   3.80Low   3.91Low   3.17Low   3.44Low   3.55Low   3.62Low    Hemoglobin 13.0 - 17.0 g/dL 11.5Low   11.1Low   10.9Low   8.9Low   9.7Low   9.7Low   9.9Low    HCT 39.0 - 52.0 % 34.3Low   33.9Low   34.1Low   27.9Low   30.2Low   31.0Low   31.4Low    MCV 80.0 - 100.0 fL 90.3  89.2  87.2  88.0  87.8  87.3  86.7   MCH 26.0 - 34.0 pg 30.3  29.2  27.9  28.1  28.2  27.3  27.3   MCHC 30.0 - 36.0 g/dL 33.5  32.7  32.0  31.9  32.1  31.3  31.5   RDW 11.5 - 15.5 % 14.7  17.0High   15.3  15.8High   16.0High   13.9  14.6   Platelets 150 - 400 K/uL 391  240  349  254  276  324  288   nRBC 0.0 - 0.2 % 0.0  0.0  0.0  0.0 CM 0.0  0.0  0.0   Neutrophils Relative % % 62  55  63   90  66  56   Neutro Abs 1.7 - 7.7 K/uL 3.9  2.7  5.1   12.5High   4.6  3.4   Lymphocytes Relative % 22  22  23   3  18  26    Lymphs Abs 0.7 - 4.0 K/uL 1.4  1.1  1.8   0.5Low   1.2  1.5   Monocytes Relative % 14  19  9   7  14  15    Monocytes Absolute 0.1 - 1.0 K/uL 0.9  0.9  0.7   0.9  0.9  0.9   Eosinophils Relative % 1  3  2    0  1  1   Eosinophils Absolute 0.0 - 0.5 K/uL 0.1  0.1  0.2   0.0  0.1  0.0   Basophils Relative % 0  0  0   0  0  1   Basophils Absolute 0.0 - 0.1 K/uL 0.0  0.0  0.0   0.0  0.0  0.0   Immature Granulocytes % 1  1  3    0  1  1   Abs Immature Granulocytes 0.00 - 0.07 K/uL 0.07  0.04 CM 0.21High  CM  0.06 CM 0.05 CM 0.03 CM  Comment: Performed at [facility], [address] [place], [place] [address]  [place]  [facility] CLIN LAB [place] CLIN LAB [place] CLIN LAB [place] CLIN LAB [place] CLIN  LAB [place] CLIN LAB [facility] CLIN LAB      Specimen Collected: 04/29/18 09:35  Last Resulted: 04/29/18 09:48     Lab Flowsheet    Order Details    View Encounter    Lab and Collection Details    Routing    Result  History      CM=Additional comments      Other Results from 04/29/2018   PSA  Order: [id number]   Status:  Final result  Visible to patient:  Yes (MyChart)  Next appt:  06/10/2018 at 09:00 AM in Oncology ([facility])  Dx:  Prostate cancer ([place])   Ref Range & Units 10mo ago (04/29/18) 56mo ago (04/01/18) 35mo ago (03/04/18) 75mo ago (02/04/18) 13mo ago (11/12/17) 53mo ago (10/15/17) 23mo ago (09/14/17)  Prostatic Specific Antigen 0.00 - 4.00 ng/mL 0.30  0.40 CM 0.47 CM 0.62 CM 0.08 CM 0.17 CM 0.15 CM  Comment: (NOTE)  While PSA levels of <=4.0 ng/ml are reported as reference range, some  men with levels below 4.0 ng/ml can have prostate cancer and many men  with PSA above 4.0 ng/ml do not have prostate cancer. Other tests  such as free PSA, age specific reference ranges, PSA velocity and PSA  doubling time may be helpful especially in men less than 46 years  old.  Performed at [facility], [place] [address], [place], [place]  [address]   Resulting Agency  [place] CLIN LAB [place] CLIN LAB [place] CLIN LAB [place] CLIN LAB [place] CLIN LAB [place] CLIN LAB [place] CLIN LAB      Specimen Collected: 04/29/18 09:35  Last Resulted: 04/29/18 20:07     Lab Flowsheet    Order Details    View Encounter    Lab and Collection Details    Routing    Result History      CM=Additional comments        Contains abnormal data Comprehensive metabolic panel  Order: [id number]   Status:  Final result  Visible to patient:  Yes (MyChart)  Next appt:  06/10/2018 at 09:00 AM in Oncology ([facility])  Dx:  Prostate cancer ([place])   Ref Range & Units 72mo ago (04/29/18) 88mo ago (04/01/18) 29mo ago (03/04/18) 73mo ago (02/26/18) 82mo ago (02/25/18) 45mo ago (02/04/18) 35mo ago (01/07/18)  Sodium 135 - 145 mmol/L  132Low   133Low   136  133Low   130Low   133Low   137   Potassium 3.5 - 5.1 mmol/L 3.6  4.0  4.5  3.9  4.0  4.2  3.9   Chloride 98 - 111 mmol/L 99  101  100  106  103  102  103   CO2 22 - 32 mmol/L 24  26  26   20Low   16Low   23  25   Glucose, Bld 70 - 99 mg/dL 148High   135High   183High   219High   282High   108High   110High    BUN 8 - 23 mg/dL 14  28High   28High   22  27High   26High   28High    Creatinine, Ser 0.61 - 1.24 mg/dL 1.04  1.01  1.06  1.15  1.27High   1.14  1.11   Calcium 8.9 - 10.3 mg/dL 9.0  8.9  9.7  7.5Low   7.9Low   9.0  9.1   Total Protein 6.5 - 8.1 g/dL 8.4High   7.5  8.1   7.3  8.1  7.8   Albumin 3.5 - 5.0 g/dL 3.7  3.7  3.6   3.2Low   3.4Low   3.5   AST 15 - 41 U/L 18  23  22   26  19  22    ALT 0 - 44 U/L 13  17  20  16  13  14    Alkaline Phosphatase 38 - 126 U/L 70  67  83   75  79  87   Total Bilirubin 0.3 - 1.2 mg/dL 0.4  0.4  0.3   0.4  0.4  0.4   GFR calc non Af Amer >60 mL/min >60  >60  >60  59Low   52Low   60Low   60Low    GFR calc Af Amer >60 mL/min >60  >60  >60  >60  >60  >60  >60 CM  Anion gap 5 - 15 9  6  CM 10 CM 7 CM 11 CM 8 CM 9 CM  Comment: Performed at [facility], [address] [place], [place] [address]  Resulting Agency  [facility] CLIN LAB [place] CLIN LAB [place] CLIN LAB [place] CLIN LAB [place] CLIN LAB [place] CLIN LAB [place] CLIN LAB      Specimen Collected: 04/29/18 09:35  Last Resulted: 04/29/18 10:00

## 2018-06-03 MED FILL — XTANDI 40 MG CAPSULE: 40 | 30 days supply | Qty: 60 | Fill #0

## 2018-06-04 ENCOUNTER — Telehealth: Payer: Self-pay | Admitting: *Deleted

## 2018-06-04 NOTE — Telephone Encounter (Signed)
I left vm for patient's daughter. md requesting to change pt's visit to lab only next Monday and have a virtual visit the next day. Requested call back from daughter to further discussions.

## 2018-06-10 ENCOUNTER — Inpatient Hospital Stay: Payer: Medicare Other | Attending: Internal Medicine

## 2018-06-10 ENCOUNTER — Ambulatory Visit: Payer: Self-pay | Admitting: Internal Medicine

## 2018-06-10 ENCOUNTER — Other Ambulatory Visit: Payer: Self-pay

## 2018-06-10 ENCOUNTER — Inpatient Hospital Stay (HOSPITAL_BASED_OUTPATIENT_CLINIC_OR_DEPARTMENT_OTHER): Payer: Medicare Other | Admitting: Internal Medicine

## 2018-06-10 ENCOUNTER — Encounter: Payer: Self-pay | Admitting: Internal Medicine

## 2018-06-10 DIAGNOSIS — C61 Malignant neoplasm of prostate: Secondary | ICD-10-CM

## 2018-06-10 DIAGNOSIS — N401 Enlarged prostate with lower urinary tract symptoms: Secondary | ICD-10-CM

## 2018-06-10 LAB — CBC WITH DIFFERENTIAL/PLATELET
Abs Immature Granulocytes: 0.02 10*3/uL (ref 0.00–0.07)
Basophils Absolute: 0 10*3/uL (ref 0.0–0.1)
Basophils Relative: 1 %
Eosinophils Absolute: 0.1 10*3/uL (ref 0.0–0.5)
Eosinophils Relative: 3 %
HCT: 35.3 % — ABNORMAL LOW (ref 39.0–52.0)
Hemoglobin: 11.6 g/dL — ABNORMAL LOW (ref 13.0–17.0)
Immature Granulocytes: 0 %
Lymphocytes Relative: 24 %
Lymphs Abs: 1.3 10*3/uL (ref 0.7–4.0)
MCH: 31.1 pg (ref 26.0–34.0)
MCHC: 32.9 g/dL (ref 30.0–36.0)
MCV: 94.6 fL (ref 80.0–100.0)
Monocytes Absolute: 0.8 10*3/uL (ref 0.1–1.0)
Monocytes Relative: 16 %
Neutro Abs: 3 10*3/uL (ref 1.7–7.7)
Neutrophils Relative %: 56 %
Platelets: 234 10*3/uL (ref 150–400)
RBC: 3.73 MIL/uL — ABNORMAL LOW (ref 4.22–5.81)
RDW: 13.5 % (ref 11.5–15.5)
WBC: 5.3 10*3/uL (ref 4.0–10.5)
nRBC: 0 % (ref 0.0–0.2)

## 2018-06-10 LAB — COMPREHENSIVE METABOLIC PANEL
ALT: 16 U/L (ref 0–44)
AST: 21 U/L (ref 15–41)
Albumin: 3.7 g/dL (ref 3.5–5.0)
Alkaline Phosphatase: 59 U/L (ref 38–126)
Anion gap: 8 (ref 5–15)
BUN: 20 mg/dL (ref 8–23)
CO2: 24 mmol/L (ref 22–32)
Calcium: 9 mg/dL (ref 8.9–10.3)
Chloride: 104 mmol/L (ref 98–111)
Creatinine, Ser: 1.09 mg/dL (ref 0.61–1.24)
GFR calc Af Amer: >60 mL/min (ref >60)
GFR calc non Af Amer: >60 mL/min (ref >60)
Glucose, Bld: 172 mg/dL — ABNORMAL HIGH (ref 70–99)
Potassium: 5.1 mmol/L (ref 3.5–5.1)
Sodium: 136 mmol/L (ref 135–145)
Total Bilirubin: 0.4 mg/dL (ref 0.3–1.2)
Total Protein: 7.8 g/dL (ref 6.5–8.1)

## 2018-06-10 LAB — PSA: Prostatic Specific Antigen: 0.29 ng/mL (ref 0.00–4.00)

## 2018-06-10 NOTE — Progress Notes (Signed)
I connected with Michael [name] on 06/10/18 at  1:00 PM EDT by telephone visit and verified that I am speaking with the correct person using two identifiers.  I discussed the limitations, risks, security and privacy concerns of performing an evaluation and management service by telemedicine and the availability of in-person appointments. I also discussed with the patient that there may be a patient responsible charge related to this service. The patient expressed understanding and agreed to proceed.    Other persons participating in the visit and their role in the encounter: daughter  Patient's location: home  Provider's location: home   Oncology History   # FEB 2019-Metastatic prostate cancer/ castrate sensitive [bulky retroperitoneal adenopathy; invasion into the bladder; rectum] Bladder Bx- prostate. On Degarelix  # April 3rd 2019- [name] [141md];  Lupron q 6 M [july26th2019]; MID AUG 2019- reduced dose to 82mday  # Poorly controlled DM-on insulin  # s/p right PCN [explanted]/ Foley cath [Dr.Brandon]; multiple UTIs.  ----------------------------------------------------------------- DIAGNOSIS: [ FEB 2019] MET. PROSTATE CA  STAGE:   IV ;GOALS: PALLIATIVE  CURRENT/MOST RECENT THERAPY [ Feb-April2019] Lupron+ [name]      [facility]    Chief Complaint: Prostate cancer   History of present illness:Michael [name] [age].o.  male with history of prostate cancer.  Patient continues to complain of mild to moderate pain of his right shoulder.  Also complains of mild worsening shortness of breath on exertion.  No shortness of breath at rest.  No chest pain.  Observation/objective: Hemoglobin 11.7.  Improving.  Assessment and plan: [facility] Metastatic prostate cancer/ castrate sensitive [bulky retroperitoneal adenopathy; invasion into the bladder; rectum]. Lupron [4534m67m;35mst 03/04/2018; On Xtandi; February 2020-CT scan shows continued  significant improvement of the retroperitoneal adenopathy; bladder/rectal invasion.;  Diffuse diffuse osseous metastatic disease-stable  #Continue Xtandi 80 mg a day [secondary to fatiTumwaterA from today pending. feb PSA- stable/ 0.3.    # Dyspnea?/ Fatigue-grade 1-2 stable secondary to XtanColumbiaonitor for now.  If dyspnea worse recommend chest x-ray also work-up for cardiopulmonary causes.   # prostate cancer mets to bone-; poor tolerance because of joint pains bone pain.  Plan Zometa every 6 months [last 2/21].  Stable.  # Anemia-iron deficiency-hemoglobin 11.7 stable.  # DISPOSITION:  # Follow up in 6  weeks-MD/labs-cbc/cmp/psa;-Dr.B  Cc: Dr. HandGinette PitmanFollow-up instructions:  I discussed the assessment and treatment plan with the patient.  The patient was provided an opportunity to ask questions and all were answered.  The patient agreed with the plan and demonstrated understanding of instructions.  The patient was advised to call back or seek an in person evaluation if the symptoms worsen or if the condition fails to improve as anticipated.  I provided 12 minutes of non face-to-face telephone visit time during this encounter, and > 50% was spent counseling as documented under my assessment & plan.   Dr. GoviCharlaine DaltonCMayaguezAlamAssociated Eye Surgical [facility] LLC0/2020 2:39 PM

## 2018-06-10 NOTE — Assessment & Plan Note (Addendum)
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 Lupron [69m q 43m; last 03/04/2018; On Xtandi; February 2020-CT scan shows continued significant improvement of the retroperitoneal adenopathy; bladder/rectal invasion.;  Diffuse diffuse osseous metastatic disease-stable  #Continue Xtandi 80 mg a day [secondary to [place]; PSA from today pending. feb PSA- stable/ 0.3.    # Dyspnea?/ Fatigue-grade 1-2 stable secondary to [place].  Monitor for now.  If dyspnea worse recommend chest x-ray also work-up for cardiopulmonary causes.   # prostate cancer mets to bone-; poor tolerance because of joint pains bone pain.  Plan Zometa every 6 months [last 2/21].  Stable.  # Anemia-iron deficiency-hemoglobin 11.7 stable.  # DISPOSITION:  # Follow up in 6  weeks-MD/labs-cbc/cmp/psa;-Dr.B  Cc: Dr. Ginette Pitman

## 2018-06-11 ENCOUNTER — Ambulatory Visit: Payer: Self-pay | Admitting: Internal Medicine

## 2018-07-04 MED FILL — XTANDI 40 MG CAPSULE: 40 | 30 days supply | Qty: 60 | Fill #1

## 2018-07-19 ENCOUNTER — Other Ambulatory Visit: Payer: Self-pay

## 2018-07-22 ENCOUNTER — Other Ambulatory Visit: Payer: Self-pay

## 2018-07-22 ENCOUNTER — Inpatient Hospital Stay (HOSPITAL_BASED_OUTPATIENT_CLINIC_OR_DEPARTMENT_OTHER): Payer: Medicare Other | Admitting: Internal Medicine

## 2018-07-22 ENCOUNTER — Inpatient Hospital Stay: Payer: Medicare Other | Attending: Internal Medicine

## 2018-07-22 ENCOUNTER — Encounter: Payer: Self-pay | Admitting: Internal Medicine

## 2018-07-22 VITALS — BP 132/83 | HR 73 | Temp 97.1°F | Wt 132.1 lb

## 2018-07-22 DIAGNOSIS — C7951 Secondary malignant neoplasm of bone: Secondary | ICD-10-CM

## 2018-07-22 DIAGNOSIS — Z171 Estrogen receptor negative status [ER-]: Secondary | ICD-10-CM | POA: Diagnosis not present

## 2018-07-22 DIAGNOSIS — C61 Malignant neoplasm of prostate: Secondary | ICD-10-CM | POA: Diagnosis present

## 2018-07-22 DIAGNOSIS — C785 Secondary malignant neoplasm of large intestine and rectum: Secondary | ICD-10-CM | POA: Diagnosis not present

## 2018-07-22 DIAGNOSIS — E1165 Type 2 diabetes mellitus with hyperglycemia: Secondary | ICD-10-CM

## 2018-07-22 DIAGNOSIS — N390 Urinary tract infection, site not specified: Secondary | ICD-10-CM

## 2018-07-22 DIAGNOSIS — M25511 Pain in right shoulder: Secondary | ICD-10-CM

## 2018-07-22 DIAGNOSIS — D509 Iron deficiency anemia, unspecified: Secondary | ICD-10-CM | POA: Insufficient documentation

## 2018-07-22 DIAGNOSIS — C7911 Secondary malignant neoplasm of bladder: Secondary | ICD-10-CM | POA: Diagnosis not present

## 2018-07-22 DIAGNOSIS — I2510 Atherosclerotic heart disease of native coronary artery without angina pectoris: Secondary | ICD-10-CM | POA: Diagnosis not present

## 2018-07-22 DIAGNOSIS — K219 Gastro-esophageal reflux disease without esophagitis: Secondary | ICD-10-CM | POA: Diagnosis not present

## 2018-07-22 DIAGNOSIS — R0600 Dyspnea, unspecified: Secondary | ICD-10-CM | POA: Diagnosis not present

## 2018-07-22 DIAGNOSIS — Z87440 Personal history of urinary (tract) infections: Secondary | ICD-10-CM

## 2018-07-22 DIAGNOSIS — I6522 Occlusion and stenosis of left carotid artery: Secondary | ICD-10-CM | POA: Insufficient documentation

## 2018-07-22 DIAGNOSIS — Z794 Long term (current) use of insulin: Secondary | ICD-10-CM

## 2018-07-22 DIAGNOSIS — Z79899 Other long term (current) drug therapy: Secondary | ICD-10-CM

## 2018-07-22 DIAGNOSIS — E785 Hyperlipidemia, unspecified: Secondary | ICD-10-CM | POA: Diagnosis not present

## 2018-07-22 DIAGNOSIS — B3749 Other urogenital candidiasis: Secondary | ICD-10-CM

## 2018-07-22 DIAGNOSIS — I10 Essential (primary) hypertension: Secondary | ICD-10-CM

## 2018-07-22 LAB — CBC WITH DIFFERENTIAL/PLATELET
Abs Immature Granulocytes: 0.02 10*3/uL (ref 0.00–0.07)
Basophils Absolute: 0 10*3/uL (ref 0.0–0.1)
Basophils Relative: 1 %
Eosinophils Absolute: 0.1 10*3/uL (ref 0.0–0.5)
Eosinophils Relative: 2 %
HCT: 35.2 % — ABNORMAL LOW (ref 39.0–52.0)
Hemoglobin: 11.9 g/dL — ABNORMAL LOW (ref 13.0–17.0)
Immature Granulocytes: 0 %
Lymphocytes Relative: 25 %
Lymphs Abs: 1.6 10*3/uL (ref 0.7–4.0)
MCH: 31.9 pg (ref 26.0–34.0)
MCHC: 33.8 g/dL (ref 30.0–36.0)
MCV: 94.4 fL (ref 80.0–100.0)
Monocytes Absolute: 0.8 10*3/uL (ref 0.1–1.0)
Monocytes Relative: 13 %
Neutro Abs: 3.7 10*3/uL (ref 1.7–7.7)
Neutrophils Relative %: 59 %
Platelets: 232 10*3/uL (ref 150–400)
RBC: 3.73 MIL/uL — ABNORMAL LOW (ref 4.22–5.81)
RDW: 13 % (ref 11.5–15.5)
WBC: 6.2 10*3/uL (ref 4.0–10.5)
nRBC: 0 % (ref 0.0–0.2)

## 2018-07-22 LAB — COMPREHENSIVE METABOLIC PANEL
ALT: 16 U/L (ref 0–44)
AST: 22 U/L (ref 15–41)
Albumin: 4 g/dL (ref 3.5–5.0)
Alkaline Phosphatase: 50 U/L (ref 38–126)
Anion gap: 8 (ref 5–15)
BUN: 28 mg/dL — ABNORMAL HIGH (ref 8–23)
CO2: 24 mmol/L (ref 22–32)
Calcium: 9.1 mg/dL (ref 8.9–10.3)
Chloride: 103 mmol/L (ref 98–111)
Creatinine, Ser: 1.07 mg/dL (ref 0.61–1.24)
GFR calc Af Amer: >60 mL/min (ref >60)
GFR calc non Af Amer: >60 mL/min (ref >60)
Glucose, Bld: 104 mg/dL — ABNORMAL HIGH (ref 70–99)
Potassium: 4.5 mmol/L (ref 3.5–5.1)
Sodium: 135 mmol/L (ref 135–145)
Total Bilirubin: 0.4 mg/dL (ref 0.3–1.2)
Total Protein: 7.8 g/dL (ref 6.5–8.1)

## 2018-07-22 LAB — URINALYSIS, COMPLETE (UACMP) WITH MICROSCOPIC
Bilirubin Urine: NEGATIVE
Glucose, UA: NEGATIVE mg/dL
Ketones, ur: NEGATIVE mg/dL
Nitrite: NEGATIVE
Protein, ur: NEGATIVE mg/dL
Specific Gravity, Urine: 1.005 (ref 1.005–1.030)
WBC, UA: >50 WBC/hpf — ABNORMAL HIGH (ref 0–5)
pH: 7 (ref 5.0–8.0)

## 2018-07-22 LAB — PSA: Prostatic Specific Antigen: 0.23 ng/mL (ref 0.00–4.00)

## 2018-07-22 NOTE — Progress Notes (Signed)
Michael [name] OFFICE PROGRESS NOTE  Patient Care Team: Michael Harrier, [name] as PCP - General (Internal Medicine)  Cancer Staging No matching staging information was found for the patient.   Oncology History   # FEB 2019-Metastatic prostate cancer/ castrate sensitive [bulky retroperitoneal adenopathy; invasion into the bladder; rectum] Bladder Bx- prostate. On Degarelix  # April 3rd 2019- [name] [161md];  Lupron q 6 M [[id number]]; MID AUG 2019- reduced dose to 864mday  # Poorly controlled DM-on insulin  # s/p right PCN [explanted]/ Foley cath [Dr.Brandon]; multiple UTIs.  ----------------------------------------------------------------- DIAGNOSIS: [ FEB 2019] MET. PROSTATE CA  STAGE:   IV ;GOALS: PALLIATIVE  CURRENT/MOST RECENT THERAPY [ Feb-April2019] Lupron+ [name]      [facility]     INTERVAL HISTORY:  GoTREAVON CASTILLEJA89.o.  male pleasant patient [name] is here for follow-up.  As per the patient is having mild pain in his right shoulder.  Not any worse.   Denies any falls.  Denies any headaches.  Mild chronic fatigue.  As per the daughter-mild foul-smelling urine.  Mild dysuria.  No fevers or chills or back pain.   Review of Systems  Constitutional: Positive for malaise/fatigue. Negative for chills, diaphoresis, fever and weight loss.  HENT: Negative for nosebleeds and sore throat.   Eyes: Negative for double vision.  Respiratory: Negative for cough, hemoptysis, sputum production, shortness of breath and wheezing.   Cardiovascular: Negative for chest pain, palpitations, orthopnea and leg swelling.  Gastrointestinal: Negative for abdominal pain, blood in stool, constipation, diarrhea, heartburn, melena, nausea and vomiting.  Genitourinary: Negative for dysuria, frequency and urgency.  Musculoskeletal: Positive for back pain and joint pain.  Skin:  Negative.  Negative for itching and rash.  Neurological: Negative for dizziness, tingling, focal weakness, weakness and headaches.  Endo/Heme/Allergies: Does not bruise/bleed easily.  Psychiatric/Behavioral: Negative for depression. The patient is not nervous/anxious and does not have insomnia.       PAST MEDICAL HISTORY :  Past Medical History:  Diagnosis Date  . Anemia    iron deficiency  . Benign prostatic hypertrophy   . Bilateral carotid artery disease (HCC)    Mild plaque formation without obstructive disease noted on carotid Doppler.  . Coronary artery disease    [facility] The patient was told about 2 blockages which did not require revascularization.  . Diabetes mellitus without complication ([facility]  . Essential hypertension   . GERD (gastroesophageal reflux disease)   . Hyperlipidemia   . Hypertension   . Left carotid artery stenosis   . [place]    PAST SURGICAL HISTORY :   Past Surgical History:  Procedure Laterality Date  . CARDIAC CATHETERIZATION  2010  . CARDIAC CATHETERIZATION N/A 02/03/2015   Procedure: Left Heart Cath and Coronary Angiography;  Surgeon: Michael [name];  [facility];  Service: Cardiovascular;  Laterality: N/A;  . CATARACT EXTRACTION Bilateral   . CYSTOSCOPY W/ RETROGRADES Bilateral 04/11/2017   Procedure: CYSTOSCOPY WITH RETROGRADE PYELOGRAM;  Surgeon: Michael [name];  Location: [facility] ORS;  Service: Urology;  Laterality: Bilateral;  . CYSTOSCOPY W/ RETROGRADES Bilateral 09/12/2017   Procedure: CYSTOSCOPY WITH RETROGRADE PYELOGRAM;  Surgeon: Michael [name];  Location: [facility] ORS;  Service: Urology;  Laterality: Bilateral;  . CYSTOSCOPY W/ RETROGRADES Bilateral 02/25/2018   Procedure: CYSTOSCOPY WITH RETROGRADE PYELOGRAM;  Surgeon: Michael [name];  [facility];  Service: Urology;  Laterality: Bilateral;  . CYSTOSCOPY W/ URETERAL STENT PLACEMENT  Bilateral 09/12/2017   Procedure: CYSTOSCOPY WITH STENT REPLACEMENT;  Surgeon: Michael [name];  Location: [facility] ORS;  Service: Urology;  Laterality: Bilateral;  . CYSTOSCOPY W/ URETERAL STENT REMOVAL Bilateral 02/25/2018   Procedure: CYSTOSCOPY WITH STENT REMOVAL;  Surgeon: Michael [name];  Location: [facility] ORS;  Service: Urology;  Laterality: Bilateral;  . CYSTOSCOPY WITH STENT PLACEMENT Left 04/11/2017   Procedure: CYSTOSCOPY WITH STENT PLACEMENT and fulgeration;  Surgeon: Michael [name];  Location: [facility] ORS;  Service: Urology;  Laterality: Left;  . CYSTOSCOPY WITH URETHRAL DILATATION Bilateral 04/11/2017   Procedure: CYSTOSCOPY WITH URETHRAL DILATATION;  Surgeon: Michael [name];  Location: [facility] ORS;  Service: Urology;  Laterality: Bilateral;  . IR CONVERT RIGHT NEPHROSTOMY TO NEPHROURETERAL CATH  05/21/2017  . IR NEPHROSTOMY PLACEMENT RIGHT  04/13/2017    FAMILY HISTORY :   Family History  Problem Relation Age of Onset  . Hypertension Father     SOCIAL HISTORY:   Social History   Tobacco Use  . Smoking status: Never Smoker  . Smokeless tobacco: Never Used  Substance Use Topics  . Alcohol use: No  . Drug use: No    ALLERGIES:  has No Known Allergies.  MEDICATIONS:  Current Outpatient Medications  Medication Sig Dispense Refill  . acetaminophen (TYLENOL) 500 MG tablet Take 500 mg by mouth every 4 (four) hours as needed for moderate pain or fever.    [name] albuterol (PROVENTIL HFA;VENTOLIN HFA) 108 (90 Base) MCG/ACT inhaler Inhale 2 puffs into the lungs every 4 (four) hours as needed for wheezing or shortness of breath.    [name] atorvastatin (LIPITOR) 10 MG tablet Take 10 mg by mouth daily at 6 PM.     . BD PEN NEEDLE MICRO U/F 32G X 6 MM MISC     . Calcium Carb-Cholecalciferol (CALCIUM 600+D3) 600-800 MG-UNIT TABS Take 1 tablet by mouth every evening.     [name] glimepiride (AMARYL) 2 MG tablet Take 2 mg by mouth daily with breakfast.    . glucose blood (ACCU-CHEK GUIDE) test  strip Use asdirected three times a day diag E11.65 100 each 3  . ibuprofen (ADVIL,MOTRIN) 200 MG tablet Take 200 mg by mouth every 6 (six) hours as needed for fever or moderate pain.     [name] insulin glargine (LANTUS) 100 UNIT/ML injection Inject 6 Units into the skin every morning. Based on sugar levels in am.     . iron polysaccharides (NIFEREX) 150 MG capsule Take 150 mg by mouth daily.     . metFORMIN (GLUCOPHAGE) 1000 MG tablet Take 1,000 mg by mouth every evening. With dinner    . metoprolol succinate (TOPROL-XL) 25 MG 24 hr tablet TAKE 1 TABLET BY MOUTH EVERY DAY FOR BLOOD PRESSURE (Patient taking differently: Take 25 mg by mouth every evening. ) 90 tablet 3  . mirabegron ER (MYRBETRIQ) 25 MG TB24 tablet Take 1 tablet (25 mg total) by mouth daily. 30 tablet 11  . Multiple Vitamins-Minerals (MULTIVITAMIN WITH MINERALS) tablet Take 1 tablet by mouth daily.     [name] omeprazole (PRILOSEC) 20 MG capsule TAKE ONE CAPSULE BY MOUTH EVERY DAY (Patient taking differently: Take 20 mg by mouth daily. ) 90 capsule 2  . XTANDI 40 MG capsule TAKE 2 CAPSULES (80 MG TOTAL) BY MOUTH DAILY. 60 capsule 3   No current facility-administered medications for this visit.     PHYSICAL EXAMINATION: ECOG PERFORMANCE STATUS: 1 -  Symptomatic but completely ambulatory  BP 132/83 (BP Location: Left Arm, Patient Position: Sitting)   Pulse 73   Temp (!) 97.1 F (36.2 C) (Tympanic)   Wt 132 lb 2 oz (59.9 kg)   BMI 21.33 kg/m   Filed Weights   07/22/18 1004  Weight: 132 lb 2 oz (59.9 kg)    Physical Exam  Constitutional: He is oriented to person, place, and time and well-developed, well-nourished, and in no distress.  He is alone.  Walking himself.  HENT:  Head: Normocephalic and atraumatic.  Mouth/Throat: Oropharynx is clear and moist. No oropharyngeal exudate.  Eyes: Pupils are equal, round, and reactive to light.  Neck: Normal range of motion. Neck supple.  Cardiovascular: Normal rate and regular rhythm.   Pulmonary/Chest: Effort normal and breath sounds normal. No respiratory distress. He has no wheezes.  Abdominal: Soft. Bowel sounds are normal. He exhibits no distension and no mass. There is no abdominal tenderness. There is no rebound and no guarding.  Musculoskeletal: Normal range of motion.        General: No tenderness or edema.  Neurological: He is alert and oriented to person, place, and time.  Skin: Skin is warm.  Psychiatric: Affect normal.       LABORATORY DATA:  I have reviewed the data as listed    Component Value Date/Time   NA 135 07/22/2018 0933   NA 136 05/07/2012 1328   K 4.5 07/22/2018 0933   K 4.5 05/07/2012 1328   CL 103 07/22/2018 0933   CL 104 05/07/2012 1328   CO2 24 07/22/2018 0933   CO2 28 05/07/2012 1328   GLUCOSE 104 (H) 07/22/2018 0933   GLUCOSE 73 05/07/2012 1328   BUN 28 (H) 07/22/2018 0933   BUN 16 05/07/2012 1328   CREATININE 1.07 07/22/2018 0933   CREATININE 0.84 05/07/2012 1328   CALCIUM 9.1 07/22/2018 0933   CALCIUM 8.8 05/07/2012 1328   PROT 7.8 07/22/2018 0933   ALBUMIN 4.0 07/22/2018 0933   AST 22 07/22/2018 0933   ALT 16 07/22/2018 0933   ALKPHOS 50 07/22/2018 0933   BILITOT 0.4 07/22/2018 0933   GFRNONAA >60 07/22/2018 0933   GFRNONAA >60 05/07/2012 1328   GFRAA >60 07/22/2018 0933   GFRAA >60 05/07/2012 1328    No results found for: SPEP, UPEP  Lab Results  Component Value Date   WBC 6.2 07/22/2018   NEUTROABS 3.7 07/22/2018   HGB 11.9 (L) 07/22/2018   HCT 35.2 (L) 07/22/2018   MCV 94.4 07/22/2018   PLT 232 07/22/2018      Chemistry      Component Value Date/Time   NA 135 07/22/2018 0933   NA 136 05/07/2012 1328   K 4.5 07/22/2018 0933   K 4.5 05/07/2012 1328   CL 103 07/22/2018 0933   CL 104 05/07/2012 1328   CO2 24 07/22/2018 0933   CO2 28 05/07/2012 1328   BUN 28 (H) 07/22/2018 0933   BUN 16 05/07/2012 1328   CREATININE 1.07 07/22/2018 0933   CREATININE 0.84 05/07/2012 1328      Component Value  Date/Time   CALCIUM 9.1 07/22/2018 0933   CALCIUM 8.8 05/07/2012 1328   ALKPHOS 50 07/22/2018 0933   AST 22 07/22/2018 0933   ALT 16 07/22/2018 0933   BILITOT 0.4 07/22/2018 0933       RADIOGRAPHIC STUDIES: I have personally reviewed the radiological images as listed and agreed with the findings in the report. No results found.   ASSESSMENT &  PLAN:  Prostate cancer [facility]) # Metastatic prostate cancer/ castrate sensitive [bulky retroperitoneal adenopathy; invasion into the bladder; rectum]. Lupron [2mq 673mlast 03/04/2018; On Xtandi; February 2020-CT scan shows continued significant improvement of the retroperitoneal adenopathy; bladder/rectal invasion.;  Diffuse diffuse osseous metastatic disease- stable;   #Continue Xtandi 80 mg a day [secondary to [place]A from today pending. feb PSA- stable/ 0.3.  Based on PSA will repeat CT scan; monitor PSA for now.   # Dyspnea?/ Fatigue-grade 1-2 stable secondary to Xt[place]hav[place].    # prostate cancer mets to bone-; poor tolerance because of joint pains bone pain.  Plan Zometa every 6 months [last 2/21].  Stable.   # Anemia-iron deficiency-hemoglobin 11.9- Stable.   # UTI-new- foul smelling/ ? symtoms- UA & culture.  Discussed regarding possible colonization/and retreatment with antibiotics will lead to antibiotic resistance.  #Discussed with patient's daughter over the phone at length.  # DISPOSITION:  # UA today # Follow up in 6  weeks-[name]/labs-cbc/cmp/psa/Lupron;-Dr.B  Cc: Dr. HaGinette Pitman No orders of the defined types were placed in this encounter.  All questions were answered. The patient knows to call the clinic with any problems, questions or concerns.      GoCammie SickleMD 07/22/2018 1:00 PM

## 2018-07-22 NOTE — Progress Notes (Signed)
Patient here today for follow up.  Patient's daughter states that pt's urine still has strong odor to it, requested urine to be tested for E. Coli again.

## 2018-07-22 NOTE — Assessment & Plan Note (Addendum)
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 Lupron [44m q 7m; last 03/04/2018; On Xtandi; February 2020-CT scan shows continued significant improvement of the retroperitoneal adenopathy; bladder/rectal invasion.;  Diffuse diffuse osseous metastatic disease- stable;   #Continue Xtandi 80 mg a day [secondary to [place]; PSA from today pending. feb PSA- stable/ 0.3.  Based on PSA will repeat CT scan; monitor PSA for now.   # Dyspnea?/ Fatigue-grade 1-2 stable secondary to [place].Stable.    # prostate cancer mets to bone-; poor tolerance because of joint pains bone pain.  Plan Zometa every 6 months [last 2/21].  Stable.   # Anemia-iron deficiency-hemoglobin 11.9- Stable.   # UTI-new- foul smelling/ ? symtoms- UA & culture.  Discussed regarding possible colonization/and retreatment with antibiotics will lead to antibiotic resistance.  #Discussed with patient's daughter over the phone at length.  # DISPOSITION:  # UA today # Follow up in 6  weeks-MD/labs-cbc/cmp/psa/Lupron;-Dr.B  Cc: Dr. Ginette Pitman

## 2018-07-24 LAB — URINE CULTURE: Culture: >=100000 — AB

## 2018-07-24 NOTE — Progress Notes (Signed)
Patient has a UTI. Would you like me to call in an antibiotic?

## 2018-07-29 ENCOUNTER — Telehealth: Payer: Self-pay | Admitting: Internal Medicine

## 2018-07-29 NOTE — Telephone Encounter (Signed)
Spoke to patient daughter Hina.  Patient does not have any fever or any dysuria suggestive of any active infection.  He just pungent smell to the urine.  Reviewed the urine culture-E. coli with multiple drug resistance.  Suspect colonization rather than active infection.  Hold off antibiotics.  Patient's daughter agrees.

## 2018-07-29 NOTE — Telephone Encounter (Signed)
I left a message for patient's daughter at [phone] discussed the urinalysis/culture results-colonization versus active infection.  Recommend call back to discuss further.

## 2018-07-29 NOTE — Telephone Encounter (Signed)
Daughter left vm returning Dr. Sharmaine Base call.

## 2018-08-05 MED FILL — XTANDI 40 MG CAPSULE: 40 | 30 days supply | Qty: 60 | Fill #2

## 2018-09-02 ENCOUNTER — Inpatient Hospital Stay (HOSPITAL_BASED_OUTPATIENT_CLINIC_OR_DEPARTMENT_OTHER): Payer: Medicare Other | Admitting: Internal Medicine

## 2018-09-02 ENCOUNTER — Inpatient Hospital Stay: Payer: Medicare Other

## 2018-09-02 ENCOUNTER — Inpatient Hospital Stay: Payer: Medicare Other | Attending: Internal Medicine

## 2018-09-02 ENCOUNTER — Other Ambulatory Visit: Payer: Self-pay

## 2018-09-02 DIAGNOSIS — R350 Frequency of micturition: Secondary | ICD-10-CM

## 2018-09-02 DIAGNOSIS — E119 Type 2 diabetes mellitus without complications: Secondary | ICD-10-CM | POA: Insufficient documentation

## 2018-09-02 DIAGNOSIS — Z191 Hormone sensitive malignancy status: Secondary | ICD-10-CM

## 2018-09-02 DIAGNOSIS — Z794 Long term (current) use of insulin: Secondary | ICD-10-CM | POA: Insufficient documentation

## 2018-09-02 DIAGNOSIS — I6522 Occlusion and stenosis of left carotid artery: Secondary | ICD-10-CM

## 2018-09-02 DIAGNOSIS — C61 Malignant neoplasm of prostate: Secondary | ICD-10-CM

## 2018-09-02 DIAGNOSIS — I2510 Atherosclerotic heart disease of native coronary artery without angina pectoris: Secondary | ICD-10-CM

## 2018-09-02 DIAGNOSIS — K219 Gastro-esophageal reflux disease without esophagitis: Secondary | ICD-10-CM | POA: Insufficient documentation

## 2018-09-02 DIAGNOSIS — R5383 Other fatigue: Secondary | ICD-10-CM

## 2018-09-02 DIAGNOSIS — E785 Hyperlipidemia, unspecified: Secondary | ICD-10-CM | POA: Insufficient documentation

## 2018-09-02 DIAGNOSIS — D509 Iron deficiency anemia, unspecified: Secondary | ICD-10-CM | POA: Diagnosis not present

## 2018-09-02 DIAGNOSIS — C7951 Secondary malignant neoplasm of bone: Secondary | ICD-10-CM

## 2018-09-02 DIAGNOSIS — I10 Essential (primary) hypertension: Secondary | ICD-10-CM

## 2018-09-02 DIAGNOSIS — Z79899 Other long term (current) drug therapy: Secondary | ICD-10-CM | POA: Insufficient documentation

## 2018-09-02 DIAGNOSIS — Z79818 Long term (current) use of other agents affecting estrogen receptors and estrogen levels: Secondary | ICD-10-CM

## 2018-09-02 LAB — COMPREHENSIVE METABOLIC PANEL
ALT: 15 U/L (ref 0–44)
AST: 21 U/L (ref 15–41)
Albumin: 3.9 g/dL (ref 3.5–5.0)
Alkaline Phosphatase: 49 U/L (ref 38–126)
Anion gap: 6 (ref 5–15)
BUN: 22 mg/dL (ref 8–23)
CO2: 23 mmol/L (ref 22–32)
Calcium: 8.9 mg/dL (ref 8.9–10.3)
Chloride: 106 mmol/L (ref 98–111)
Creatinine, Ser: 0.96 mg/dL (ref 0.61–1.24)
GFR calc Af Amer: >60 mL/min (ref >60)
GFR calc non Af Amer: >60 mL/min (ref >60)
Glucose, Bld: 132 mg/dL — ABNORMAL HIGH (ref 70–99)
Potassium: 4.1 mmol/L (ref 3.5–5.1)
Sodium: 135 mmol/L (ref 135–145)
Total Bilirubin: 0.5 mg/dL (ref 0.3–1.2)
Total Protein: 7.8 g/dL (ref 6.5–8.1)

## 2018-09-02 LAB — CBC WITH DIFFERENTIAL/PLATELET
Abs Immature Granulocytes: 0.03 10*3/uL (ref 0.00–0.07)
Basophils Absolute: 0 10*3/uL (ref 0.0–0.1)
Basophils Relative: 1 %
Eosinophils Absolute: 0.1 10*3/uL (ref 0.0–0.5)
Eosinophils Relative: 2 %
HCT: 36.8 % — ABNORMAL LOW (ref 39.0–52.0)
Hemoglobin: 12.5 g/dL — ABNORMAL LOW (ref 13.0–17.0)
Immature Granulocytes: 0 %
Lymphocytes Relative: 27 %
Lymphs Abs: 1.9 10*3/uL (ref 0.7–4.0)
MCH: 31.6 pg (ref 26.0–34.0)
MCHC: 34 g/dL (ref 30.0–36.0)
MCV: 93.2 fL (ref 80.0–100.0)
Monocytes Absolute: 0.8 10*3/uL (ref 0.1–1.0)
Monocytes Relative: 11 %
Neutro Abs: 4 10*3/uL (ref 1.7–7.7)
Neutrophils Relative %: 59 %
Platelets: 223 10*3/uL (ref 150–400)
RBC: 3.95 MIL/uL — ABNORMAL LOW (ref 4.22–5.81)
RDW: 12.2 % (ref 11.5–15.5)
WBC: 6.8 10*3/uL (ref 4.0–10.5)
nRBC: 0 % (ref 0.0–0.2)

## 2018-09-02 LAB — PSA: Prostatic Specific Antigen: 0.18 ng/mL (ref 0.00–4.00)

## 2018-09-02 MED ORDER — LEUPROLIDE ACETATE (6 MONTH) 45 MG IM KIT
45.0000 mg | PACK | Freq: Once | INTRAMUSCULAR | Status: AC
  Administered 2018-09-02: 45 mg via INTRAMUSCULAR
  Filled 2018-09-02: qty 45

## 2018-09-02 NOTE — Assessment & Plan Note (Addendum)
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 Lupron [1m q 18m; last 03/04/2018; On Xtandi; February 2020-CT scan shows continued significant improvement of the retroperitoneal adenopathy; bladder/rectal invasion.;  Diffuse diffuse osseous metastatic disease- Stable.   #Continue Xtandi 80 mg a day [secondary to fatigue]; PSA from today pending. feb PSA- stable/ 0.23.     # prostate cancer mets to bone-; poor tolerance because of joint pains bone pain.  Plan Zometa every 6 months [last 2/21]. Stable.   # Anemia-iron deficiency-hemoglobin 12.5- Stable.   # increased urinary frequency- chronic- not any worse; UA-June 2020 shows ESBL E. Coli.  Not treated; thought to be colonization.  Discussed with the daughter if improved will let us know we will repeat UA and culture.  #Discussed with patient's daughter over the phone at length.  # DISPOSITION:  # Lupron today.  # Follow up in 6  weeks-MD/labs-cbc/cmp/psa/Zometa-Dr.B  Cc: Dr. Ginette Pitman

## 2018-09-02 NOTE — Progress Notes (Signed)
Called patient's daughter [name] at [phone].  Hina, daughter, confirmed there ar no changes in patient's medications and he is doing well.  BP 156/96 in office today.

## 2018-09-02 NOTE — Progress Notes (Signed)
[place] OFFICE PROGRESS NOTE  Patient Care Team: [name], MD as PCP - General (Internal Medicine)  Cancer Staging No matching staging information was found for the patient.   Oncology History Overview Note  # FEB 2019-Metastatic prostate cancer/ castrate sensitive [bulky retroperitoneal adenopathy; invasion into the bladder; rectum] Bladder Bx- prostate. On Degarelix  # April 3rd 2019- [name] [136md];  Lupron q 6 M [[id number]]; MID AUG 2019- reduced dose to 880mday  # Poorly controlled DM-on insulin  # s/p right PCN [explanted]/ Foley cath [Dr.Brandon]; multiple UTIs.  ----------------------------------------------------------------- DIAGNOSIS: [ FEB 2019] MET. PROSTATE CA  STAGE:   IV ;GOALS: PALLIATIVE  CURRENT/MOST RECENT THERAPY [ Feb-April2019] Lupron+ [name]    [facility]     INTERVAL HISTORY:  GoLINAS STEPTER89.o.  male pleasant patient [name] is here for follow-up.  Patient denies any falls.  Denies any headaches.  Chronic mild fatigue but not any worse.  He continues to have chronic mild difficulty with urination/increased frequency.  However this is not getting any worse.  No fevers or chills.   Review of Systems  Constitutional: Positive for malaise/fatigue. Negative for chills, diaphoresis, fever and weight loss.  HENT: Negative for nosebleeds and sore throat.   Eyes: Negative for double vision.  Respiratory: Negative for cough, hemoptysis, sputum production, shortness of breath and wheezing.   Cardiovascular: Negative for chest pain, palpitations, orthopnea and leg swelling.  Gastrointestinal: Negative for abdominal pain, blood in stool, constipation, diarrhea, heartburn, melena, nausea and vomiting.  Genitourinary: Positive for dysuria and frequency.  Musculoskeletal: Positive for back pain and joint pain.  Skin: Negative.   Negative for itching and rash.  Neurological: Negative for dizziness, tingling, focal weakness, weakness and headaches.  Endo/Heme/Allergies: Does not bruise/bleed easily.  Psychiatric/Behavioral: Negative for depression. The patient is not nervous/anxious and does not have insomnia.       PAST MEDICAL HISTORY :  Past Medical History:  Diagnosis Date  . Anemia    iron deficiency  . Benign prostatic hypertrophy   . Bilateral carotid artery disease (HCC)    Mild plaque formation without obstructive disease noted on carotid Doppler.  . Coronary artery disease    [facility] The patient was told about 2 blockages which did not require revascularization.  . Diabetes mellitus without complication ([facility]  . Essential hypertension   . GERD (gastroesophageal reflux disease)   . Hyperlipidemia   . Hypertension   . Left carotid artery stenosis   . [place]    PAST SURGICAL HISTORY :   Past Surgical History:  Procedure Laterality Date  . CARDIAC CATHETERIZATION  2010  . CARDIAC CATHETERIZATION N/A 02/03/2015   Procedure: Left Heart Cath and Coronary Angiography;  Surgeon: MuWellington HampshireMD;  [facility];  Service: Cardiovascular;  Laterality: N/A;  . CATARACT EXTRACTION Bilateral   . CYSTOSCOPY W/ RETROGRADES Bilateral 04/11/2017   Procedure: CYSTOSCOPY WITH RETROGRADE PYELOGRAM;  Surgeon: [name];  Location: [facility];  Service: Urology;  Laterality: Bilateral;  . CYSTOSCOPY W/ RETROGRADES Bilateral 09/12/2017   Procedure: CYSTOSCOPY WITH RETROGRADE PYELOGRAM;  Surgeon: [name];  Location: [facility];  Service: Urology;  Laterality: Bilateral;  . CYSTOSCOPY W/ RETROGRADES Bilateral 02/25/2018   Procedure: CYSTOSCOPY WITH RETROGRADE PYELOGRAM;  Surgeon: [name];  Location: [facility];  Service: Urology;  Laterality: Bilateral;  .  CYSTOSCOPY W/ URETERAL STENT PLACEMENT Bilateral  09/12/2017   Procedure: CYSTOSCOPY WITH STENT REPLACEMENT;  Surgeon: [name], MD;  Location: [facility];  Service: Urology;  Laterality: Bilateral;  . CYSTOSCOPY W/ URETERAL STENT REMOVAL Bilateral 02/25/2018   Procedure: CYSTOSCOPY WITH STENT REMOVAL;  Surgeon: [name], MD;  Location: [facility];  Service: Urology;  Laterality: Bilateral;  . CYSTOSCOPY WITH STENT PLACEMENT Left 04/11/2017   Procedure: CYSTOSCOPY WITH STENT PLACEMENT and fulgeration;  Surgeon: [name], MD;  Location: [facility];  Service: Urology;  Laterality: Left;  . CYSTOSCOPY WITH URETHRAL DILATATION Bilateral 04/11/2017   Procedure: CYSTOSCOPY WITH URETHRAL DILATATION;  Surgeon: [name], MD;  Location: [facility];  Service: Urology;  Laterality: Bilateral;  . IR CONVERT RIGHT NEPHROSTOMY TO NEPHROURETERAL CATH  05/21/2017  . IR NEPHROSTOMY PLACEMENT RIGHT  04/13/2017    FAMILY HISTORY :   Family History  Problem Relation Age of Onset  . Hypertension Father     SOCIAL HISTORY:   Social History   Tobacco Use  . Smoking status: Never Smoker  . Smokeless tobacco: Never Used  Substance Use Topics  . Alcohol use: No  . Drug use: No    ALLERGIES:  has No Known Allergies.  MEDICATIONS:  Current Outpatient Medications  Medication Sig Dispense Refill  . acetaminophen (TYLENOL) 500 MG tablet Take 500 mg by mouth every 4 (four) hours as needed for moderate pain or fever.    [name] albuterol (PROVENTIL HFA;VENTOLIN HFA) 108 (90 Base) MCG/ACT inhaler Inhale 2 puffs into the lungs every 4 (four) hours as needed for wheezing or shortness of breath.    [name] atorvastatin (LIPITOR) 10 MG tablet Take 10 mg by mouth daily at 6 PM.     . BD PEN NEEDLE MICRO U/F 32G X 6 MM MISC     . Calcium Carb-Cholecalciferol (CALCIUM 600+D3) 600-800 MG-UNIT TABS Take 1 tablet by mouth every evening.     [name] glimepiride (AMARYL) 2 MG tablet Take 2 mg by mouth daily with breakfast.    . glucose blood (ACCU-CHEK GUIDE) test strip Use  asdirected three times a day diag E11.65 100 each 3  . ibuprofen (ADVIL,MOTRIN) 200 MG tablet Take 200 mg by mouth every 6 (six) hours as needed for fever or moderate pain.     [name] insulin glargine (LANTUS) 100 UNIT/ML injection Inject 6 Units into the skin every morning. Based on sugar levels in am.     . iron polysaccharides (NIFEREX) 150 MG capsule Take 150 mg by mouth daily.     . metFORMIN (GLUCOPHAGE) 1000 MG tablet Take 1,000 mg by mouth every evening. With dinner    . metoprolol succinate (TOPROL-XL) 25 MG 24 hr tablet TAKE 1 TABLET BY MOUTH EVERY DAY FOR BLOOD PRESSURE (Patient taking differently: Take 25 mg by mouth every evening. ) 90 tablet 3  . mirabegron ER (MYRBETRIQ) 25 MG TB24 tablet Take 1 tablet (25 mg total) by mouth daily. 30 tablet 11  . Multiple Vitamins-Minerals (MULTIVITAMIN WITH MINERALS) tablet Take 1 tablet by mouth daily.     [name] omeprazole (PRILOSEC) 20 MG capsule TAKE ONE CAPSULE BY MOUTH EVERY DAY (Patient taking differently: Take 20 mg by mouth daily. ) 90 capsule 2  . XTANDI 40 MG capsule TAKE 2 CAPSULES (80 MG TOTAL) BY MOUTH DAILY. 60 capsule 3   No current facility-administered medications for this visit.     PHYSICAL EXAMINATION: ECOG PERFORMANCE STATUS: 1 - Symptomatic but completely ambulatory  BP (!) 156/96  Pulse 86   Temp (!) 97 F (36.1 C)   Resp 18   Wt 136 lb 8 oz (61.9 kg)   BMI 22.03 kg/m   Filed Weights   09/02/18 1002  Weight: 136 lb 8 oz (61.9 kg)    Physical Exam  Constitutional: He is oriented to person, place, and time and well-developed, well-nourished, and in no distress.  He is alone.  Walking himself.  HENT:  Head: Normocephalic and atraumatic.  Mouth/Throat: Oropharynx is clear and moist. No oropharyngeal exudate.  Eyes: Pupils are equal, round, and reactive to light.  Neck: Normal range of motion. Neck supple.  Cardiovascular: Normal rate and regular rhythm.  Pulmonary/Chest: Effort normal and breath sounds normal. No  respiratory distress. He has no wheezes.  Abdominal: Soft. Bowel sounds are normal. He exhibits no distension and no mass. There is no abdominal tenderness. There is no rebound and no guarding.  Musculoskeletal: Normal range of motion.        General: No tenderness or edema.  Neurological: He is alert and oriented to person, place, and time.  Skin: Skin is warm.  Psychiatric: Affect normal.       LABORATORY DATA:  I have reviewed the data as listed    Component Value Date/Time   NA 135 09/02/2018 0946   NA 136 05/07/2012 1328   K 4.1 09/02/2018 0946   K 4.5 05/07/2012 1328   CL 106 09/02/2018 0946   CL 104 05/07/2012 1328   CO2 23 09/02/2018 0946   CO2 28 05/07/2012 1328   GLUCOSE 132 (H) 09/02/2018 0946   GLUCOSE 73 05/07/2012 1328   BUN 22 09/02/2018 0946   BUN 16 05/07/2012 1328   CREATININE 0.96 09/02/2018 0946   CREATININE 0.84 05/07/2012 1328   CALCIUM 8.9 09/02/2018 0946   CALCIUM 8.8 05/07/2012 1328   PROT 7.8 09/02/2018 0946   ALBUMIN 3.9 09/02/2018 0946   AST 21 09/02/2018 0946   ALT 15 09/02/2018 0946   ALKPHOS 49 09/02/2018 0946   BILITOT 0.5 09/02/2018 0946   GFRNONAA >60 09/02/2018 0946   GFRNONAA >60 05/07/2012 1328   GFRAA >60 09/02/2018 0946   GFRAA >60 05/07/2012 1328    No results found for: SPEP, UPEP  Lab Results  Component Value Date   WBC 6.8 09/02/2018   NEUTROABS 4.0 09/02/2018   HGB 12.5 (L) 09/02/2018   HCT 36.8 (L) 09/02/2018   MCV 93.2 09/02/2018   PLT 223 09/02/2018      Chemistry      Component Value Date/Time   NA 135 09/02/2018 0946   NA 136 05/07/2012 1328   K 4.1 09/02/2018 0946   K 4.5 05/07/2012 1328   CL 106 09/02/2018 0946   CL 104 05/07/2012 1328   CO2 23 09/02/2018 0946   CO2 28 05/07/2012 1328   BUN 22 09/02/2018 0946   BUN 16 05/07/2012 1328   CREATININE 0.96 09/02/2018 0946   CREATININE 0.84 05/07/2012 1328      Component Value Date/Time   CALCIUM 8.9 09/02/2018 0946   CALCIUM 8.8 05/07/2012 1328    ALKPHOS 49 09/02/2018 0946   AST 21 09/02/2018 0946   ALT 15 09/02/2018 0946   BILITOT 0.5 09/02/2018 0946       RADIOGRAPHIC STUDIES: I have personally reviewed the radiological images as listed and agreed with the findings in the report. No results found.   ASSESSMENT & PLAN:  Prostate cancer [facility]) # Metastatic prostate cancer/ castrate sensitive [bulky retroperitoneal adenopathy;  invasion into the bladder; rectum]. Lupron [62mq 650mlast 03/04/2018; On Xtandi; February 2020-CT scan shows continued significant improvement of the retroperitoneal adenopathy; bladder/rectal invasion.;  Diffuse diffuse osseous metastatic disease- Stable.   #Continue Xtandi 80 mg a day [secondary to fatigue]; PSA from today pending. feb PSA- stable/ 0.23.     # prostate cancer mets to bone-; poor tolerance because of joint pains bone pain.  Plan Zometa every 6 months [last 2/21]. Stable.   # Anemia-iron deficiency-hemoglobin 12.5- Stable.   # increased urinary frequency- chronic- not any worse; UA-June 2020 shows ESBL E. Coli.  Not treated; thought to be colonization.  Discussed with the daughter if improved will let usKoreanow we will repeat UA and culture.  #Discussed with patient's daughter over the phone at length.  # DISPOSITION:  # Lupron today.  # Follow up in 6  weeks-MD/labs-cbc/cmp/psa/Zometa-Dr.B  Cc: Dr. HaGinette Pitman No orders of the defined types were placed in this encounter.  All questions were answered. The patient knows to call the clinic with any problems, questions or concerns.      GoCammie SickleMD 09/02/2018 10:54 AM

## 2018-09-04 MED FILL — XTANDI 40 MG CAPSULE: 40 | 30 days supply | Qty: 60 | Fill #3

## 2018-09-30 ENCOUNTER — Other Ambulatory Visit: Payer: Self-pay | Admitting: Internal Medicine

## 2018-09-30 DIAGNOSIS — C61 Malignant neoplasm of prostate: Secondary | ICD-10-CM

## 2018-09-30 NOTE — Telephone Encounter (Signed)
CBC with Differential Order: [id number] Status:  Final result Visible to patient:  Yes (MyChart) Next appt:  10/08/2018 at 03:15 PM in Urology [name], MD) Dx:  Prostate cancer [facility])  Ref Range & Units 4wk ago 27mo ago 22mo ago  WBC 4.0 - 10.5 K/uL 6.8  6.2  5.3   RBC 4.22 - 5.81 MIL/uL 3.95Low   3.73Low   3.73Low    Hemoglobin 13.0 - 17.0 g/dL 12.5Low   11.9Low   11.6Low    HCT 39.0 - 52.0 % 36.8Low   35.2Low   35.3Low    MCV 80.0 - 100.0 fL 93.2  94.4  94.6   MCH 26.0 - 34.0 pg 31.6  31.9  31.1   MCHC 30.0 - 36.0 g/dL 34.0  33.8  32.9   RDW 11.5 - 15.5 % 12.2  13.0  13.5   Platelets 150 - 400 K/uL 223  232  234   nRBC 0.0 - 0.2 % 0.0  0.0  0.0   Neutrophils Relative % % 59  59  56   Neutro Abs 1.7 - 7.7 K/uL 4.0  3.7  3.0   Lymphocytes Relative % 27  25  24    Lymphs Abs 0.7 - 4.0 K/uL 1.9  1.6  1.3   Monocytes Relative % 11  13  16    Monocytes Absolute 0.1 - 1.0 K/uL 0.8  0.8  0.8   Eosinophils Relative % 2  2  3    Eosinophils Absolute 0.0 - 0.5 K/uL 0.1  0.1  0.1   Basophils Relative % 1  1  1    Basophils Absolute 0.0 - 0.1 K/uL 0.0  0.0  0.0   Immature Granulocytes % 0  0  0   Abs Immature Granulocytes 0.00 - 0.07 K/uL 0.03  0.02 CM  0.02 CM   Comment: Performed at [facility], [address] [place], [place] [address]  Resulting Agency  [facility] CL[facility] LAB [facility] CLIN LAB [facility] LAB      Specimen Collected: 09/02/18 09:46 Last Resulted: 09/02/18 09:56     Lab Flowsheet   Order Details   View Encounter   Lab and Collection Details   Routing   Result History     CM=Additional comments      Other Results from 09/02/2018  Result Notes for PSA  Notes recorded by [name], MD on 09/03/2018 at 9:58 AM EDT  Hi Hina: Your father's PSA is 0.18/improving. Continue Xtandi no new recommendations.   Call us if questions   Thank you Dr. Tonita Cong  PSA Order: [id number]  Status:  Final result Visible to patient:  Yes (MyChart) Next appt:   10/08/2018 at 03:15 PM in Urology [name], MD) Dx:  Prostate cancer [facility])  Ref Range & Units 4wk ago 42mo ago 85mo ago  Prostatic Specific Antigen 0.00 - 4.00 ng/mL 0.18  0.23 CM  0.29 CM   Comment: (NOTE)  While PSA levels of <=4.0 ng/ml are reported as reference range, some  men with levels below 4.0 ng/ml can have prostate cancer and many men  with PSA above 4.0 ng/ml do not have prostate cancer. Other tests  such as free PSA, age specific reference ranges, PSA velocity and PSA  doubling time may be helpful especially in men less than 44 years  old.  Performed at [facility], [place] [address], [place], [place]  [address]   Resulting Agency  [place] [facility] [place] [facility]  Specimen Collected: 09/02/18 09:46 Last Resulted: 09/02/18 14:34     Lab Flowsheet   Order Details   View Encounter   Lab and Collection Details   Routing   Result History     CM=Additional comments        Result Notes for Comprehensive metabolic panel  Notes recorded by [name], MD on 09/03/2018 at 9:58 AM EDT  Hi Hina: Your father's PSA is 0.18/improving. Continue Xtandi no new recommendations.   Call us if questions   Thank you Dr. Tonita Cong  Contains abnormal data Comprehensive metabolic panel Order: [id number]  Status:  Final result Visible to patient:  Yes (MyChart) Next appt:  10/08/2018 at 03:15 PM in Urology [name], MD) Dx:  Prostate cancer [facility])  Ref Range & Units 4wk ago 17mo ago 72mo ago  Sodium 135 - 145 mmol/L 135  135  136   Potassium 3.5 - 5.1 mmol/L 4.1  4.5  5.1   Chloride 98 - 111 mmol/L 106  103  104   CO2 22 - 32 mmol/L 23  24  24    Glucose, Bld 70 - 99 mg/dL 132High   104High   172High    BUN 8 - 23 mg/dL 22  28High   20   Creatinine, Ser 0.61 - 1.24 mg/dL 0.96  1.07  1.09   Calcium 8.9 - 10.3 mg/dL 8.9  9.1  9.0   Total Protein 6.5 - 8.1 g/dL 7.8  7.8  7.8   Albumin 3.5 - 5.0 g/dL 3.9  4.0  3.7   AST 15 - 41 U/L 21  22   21    ALT 0 - 44 U/L 15  16  16    Alkaline Phosphatase 38 - 126 U/L 49  50  59   Total Bilirubin 0.3 - 1.2 mg/dL 0.5  0.4  0.4   GFR calc non Af Amer >60 mL/min >60  >60  >60   GFR calc Af Amer >60 mL/min >60  >60  >60   Anion gap 5 - 15 6  8  CM  8 CM   Comment: Performed at [facility], [address] [place], [place] [address]  Resulting Agency  [facility] CL[facility] LAB [facility] LAB [facility] LAB      Specimen Collected: 09/02/18 09:46 Last Resulted: 09/02/18 10:09

## 2018-10-03 MED FILL — XTANDI 40 MG CAPSULE: 40 | 30 days supply | Qty: 60 | Fill #0

## 2018-10-08 ENCOUNTER — Encounter: Payer: Self-pay | Admitting: Urology

## 2018-10-08 ENCOUNTER — Ambulatory Visit (INDEPENDENT_AMBULATORY_CARE_PROVIDER_SITE_OTHER): Payer: Medicare Other | Admitting: Urology

## 2018-10-08 ENCOUNTER — Other Ambulatory Visit: Payer: Self-pay

## 2018-10-08 VITALS — BP 117/71 | HR 90 | Ht 66.0 in | Wt 136.0 lb

## 2018-10-08 DIAGNOSIS — N1330 Unspecified hydronephrosis: Secondary | ICD-10-CM

## 2018-10-08 DIAGNOSIS — N320 Bladder-neck obstruction: Secondary | ICD-10-CM

## 2018-10-08 DIAGNOSIS — R350 Frequency of micturition: Secondary | ICD-10-CM

## 2018-10-08 DIAGNOSIS — C61 Malignant neoplasm of prostate: Secondary | ICD-10-CM

## 2018-10-08 DIAGNOSIS — R8271 Bacteriuria: Secondary | ICD-10-CM

## 2018-10-08 LAB — URINALYSIS, COMPLETE
Bilirubin, UA: NEGATIVE
Glucose, UA: NEGATIVE
Ketones, UA: NEGATIVE
Nitrite, UA: POSITIVE — AB
Protein,UA: NEGATIVE
Specific Gravity, UA: 1.01 (ref 1.005–1.030)
Urobilinogen, Ur: 0.2 mg/dL (ref 0.2–1.0)
pH, UA: 6.5 (ref 5.0–7.5)

## 2018-10-08 LAB — MICROSCOPIC EXAMINATION: WBC, UA: >30 /hpf — AB (ref 0–5)

## 2018-10-08 NOTE — Progress Notes (Signed)
10/08/2018 2:55 PM   Michael [name] 1933/04/10 [id number]  Referring provider: [name], [facility] [place],  [place] [address]  Chief Complaint  Patient presents with  . Prostate Cancer    HPI: 85 year old male with advanced metastatic prostate cancer on ADT/Xtandi who returns today for 79-month follow-up.  Creatinine has remained stable.  No additional interval imaging.  In terms of urinary symptoms, he is cathing at decreased frequency.  He is able to advance without difficulty.  Currently on Myrbetriq 50 for urgency frequency symptoms, improving.  He has gained weight.  Overall feeling quite well.  Daughter is concerned today about odor of urine.  Denies dysuria, fevers or chills.   PMH: Past Medical History:  Diagnosis Date  . Anemia    iron deficiency  . Benign prostatic hypertrophy   . Bilateral carotid artery disease (HCC)    Mild plaque formation without obstructive disease noted on carotid Doppler.  . Coronary artery disease    Previous cardiac catheterization at [facility] in 2010. The patient was told about 2 blockages which did not require revascularization.  . Diabetes mellitus without complication ([place])   . Essential hypertension   . GERD (gastroesophageal reflux disease)   . Hyperlipidemia   . Hypertension   . Left carotid artery stenosis   . Prostate cancer ([place]) 03/2017    Surgical History: Past Surgical History:  Procedure Laterality Date  . CARDIAC CATHETERIZATION  2010  . CARDIAC CATHETERIZATION N/A 02/03/2015   Procedure: Left Heart Cath and Coronary Angiography;  Surgeon: Wellington [name], MD;  Location: [place] [facility];  Service: Cardiovascular;  Laterality: N/A;  . CATARACT EXTRACTION Bilateral   . CYSTOSCOPY W/ RETROGRADES Bilateral 04/11/2017   Procedure: CYSTOSCOPY WITH RETROGRADE PYELOGRAM;  Surgeon: [name], MD;  Location: [facility];  Service: Urology;  Laterality: Bilateral;  .  CYSTOSCOPY W/ RETROGRADES Bilateral 09/12/2017   Procedure: CYSTOSCOPY WITH RETROGRADE PYELOGRAM;  Surgeon: [name], MD;  Location: [facility];  Service: Urology;  Laterality: Bilateral;  . CYSTOSCOPY W/ RETROGRADES Bilateral 02/25/2018   Procedure: CYSTOSCOPY WITH RETROGRADE PYELOGRAM;  Surgeon: [name], MD;  Location: [facility];  Service: Urology;  Laterality: Bilateral;  . CYSTOSCOPY W/ URETERAL STENT PLACEMENT Bilateral 09/12/2017   Procedure: CYSTOSCOPY WITH STENT REPLACEMENT;  Surgeon: [name], MD;  Location: [facility];  Service: Urology;  Laterality: Bilateral;  . CYSTOSCOPY W/ URETERAL STENT REMOVAL Bilateral 02/25/2018   Procedure: CYSTOSCOPY WITH STENT REMOVAL;  Surgeon: [name], MD;  Location: [facility];  Service: Urology;  Laterality: Bilateral;  . CYSTOSCOPY WITH STENT PLACEMENT Left 04/11/2017   Procedure: CYSTOSCOPY WITH STENT PLACEMENT and fulgeration;  Surgeon: [name], MD;  Location: [facility];  Service: Urology;  Laterality: Left;  . CYSTOSCOPY WITH URETHRAL DILATATION Bilateral 04/11/2017   Procedure: CYSTOSCOPY WITH URETHRAL DILATATION;  Surgeon: [name], MD;  Location: [facility];  Service: Urology;  Laterality: Bilateral;  . IR CONVERT RIGHT NEPHROSTOMY TO NEPHROURETERAL CATH  05/21/2017  . IR NEPHROSTOMY PLACEMENT RIGHT  04/13/2017    Home Medications:  Allergies as of 10/08/2018   No Known Allergies     Medication List       Accurate as of October 08, 2018  2:55 PM. If you have any questions, ask your nurse or doctor.        acetaminophen 500 MG tablet Commonly known as: TYLENOL Take 500 mg by mouth every 4 (four) hours as needed for moderate pain or fever.  albuterol 108 (90 Base) MCG/ACT inhaler Commonly known as: VENTOLIN HFA Inhale 2 puffs into the lungs every 4 (four) hours as needed for wheezing or shortness of breath.   atorvastatin 10 MG tablet Commonly known as: LIPITOR Take 10 mg by mouth daily at 6 PM.   BD Pen  Needle Micro U/F 32G X 6 MM Misc Generic drug: Insulin Pen Needle   Calcium 600+D3 600-800 MG-UNIT Tabs Generic drug: Calcium Carb-Cholecalciferol Take 1 tablet by mouth every evening.   glimepiride 2 MG tablet Commonly known as: AMARYL Take 2 mg by mouth daily with breakfast.   glucose blood test strip Commonly known as: Accu-Chek Guide Use asdirected three times a day diag E11.65   ibuprofen 200 MG tablet Commonly known as: ADVIL Take 200 mg by mouth every 6 (six) hours as needed for fever or moderate pain.   insulin glargine 100 UNIT/ML injection Commonly known as: LANTUS Inject 6 Units into the skin every morning. Based on sugar levels in am.   iron polysaccharides 150 MG capsule Commonly known as: NIFEREX Take 150 mg by mouth daily.   metFORMIN 1000 MG tablet Commonly known as: GLUCOPHAGE Take 1,000 mg by mouth every evening. With dinner   metoprolol succinate 25 MG 24 hr tablet Commonly known as: TOPROL-XL TAKE 1 TABLET BY MOUTH EVERY DAY FOR BLOOD PRESSURE What changed: See the new instructions.   mirabegron ER 25 MG Tb24 tablet Commonly known as: MYRBETRIQ Take 1 tablet (25 mg total) by mouth daily.   multivitamin with minerals tablet Take 1 tablet by mouth daily.   omeprazole 20 MG capsule Commonly known as: PRILOSEC TAKE ONE CAPSULE BY MOUTH EVERY DAY   Xtandi 40 MG capsule Generic drug: enzalutamide TAKE 2 CAPSULES (80 MG TOTAL) BY MOUTH DAILY.       Allergies: No Known Allergies  Family History: Family History  Problem Relation Age of Onset  . Hypertension Father     Social History:  reports that he has never smoked. He has never used smokeless tobacco. He reports that he does not drink alcohol or use drugs.  ROS: UROLOGY Frequent Urination?: Yes Hard to postpone urination?: No Burning/pain with urination?: Yes Get up at night to urinate?: No Leakage of urine?: No Urine stream starts and stops?: No Trouble starting stream?: No Do  you have to strain to urinate?: No Blood in urine?: No Urinary tract infection?: No Sexually transmitted disease?: No Injury to kidneys or bladder?: No Painful intercourse?: No Weak stream?: No Erection problems?: No Penile pain?: No  Gastrointestinal Nausea?: No Vomiting?: No Indigestion/heartburn?: No Diarrhea?: No Constipation?: No  Constitutional Fever: No Night sweats?: No Weight loss?: No Fatigue?: No  Skin Skin rash/lesions?: No Itching?: No  Eyes Blurred vision?: No Double vision?: No  Ears/Nose/Throat Sore throat?: No Sinus problems?: No  Hematologic/Lymphatic Swollen glands?: No Easy bruising?: No  Cardiovascular Leg swelling?: No  Respiratory Cough?: No Shortness of breath?: No  Endocrine Excessive thirst?: No  Musculoskeletal Back pain?: No Joint pain?: No  Neurological Headaches?: No Dizziness?: No  Psychologic Depression?: No Anxiety?: No  Physical Exam: BP 117/71   Pulse 90   Ht 5\' 6"  (1.676 m)   Wt 136 lb (61.7 kg)   BMI 21.95 kg/m   Constitutional:  Alert and oriented, No acute distress. HEENT: [place] AT, moist mucus membranes.  Trachea midline, no masses. Cardiovascular: No clubbing, cyanosis, or edema. Respiratory: Normal respiratory effort, no increased work of breathing. Skin: No rashes, bruises or suspicious lesions. Neurologic:  Grossly intact, no focal deficits, moving all 4 extremities. Psychiatric: Normal mood and affect.  Laboratory Data: Lab Results  Component Value Date   WBC 6.8 09/02/2018   HGB 12.5 (L) 09/02/2018   HCT 36.8 (L) 09/02/2018   MCV 93.2 09/02/2018   PLT 223 09/02/2018    Lab Results  Component Value Date   CREATININE 0.96 09/02/2018      Lab Results  Component Value Date   HGBA1C 9.3 04/05/2017    Urinalysis    Component Value Date/Time   COLORURINE YELLOW (A) 07/22/2018 1036   APPEARANCEUR HAZY (A) 07/22/2018 1036   APPEARANCEUR Cloudy (A) 01/29/2018 1510   LABSPEC 1.005  07/22/2018 1036   PHURINE 7.0 07/22/2018 1036   GLUCOSEU NEGATIVE 07/22/2018 1036   HGBUR SMALL (A) 07/22/2018 1036   BILIRUBINUR NEGATIVE 07/22/2018 1036   BILIRUBINUR Negative 01/29/2018 1510   KETONESUR NEGATIVE 07/22/2018 1036   PROTEINUR NEGATIVE 07/22/2018 1036   UROBILINOGEN 0.2 04/05/2017 1214   NITRITE NEGATIVE 07/22/2018 1036   LEUKOCYTESUR LARGE (A) 07/22/2018 1036    Lab Results  Component Value Date   LABMICR See below: 01/29/2018   WBCUA >30 (H) 01/29/2018   RBCUA >30 (H) 01/29/2018   LABEPIT None seen 01/29/2018   MUCUS Present (A) 01/29/2018   BACTERIA RARE (A) 07/22/2018    Pertinent Imaging: Results for orders placed during the hospital encounter of 03/08/18  US RENAL   Narrative CLINICAL DATA:  Prostate cancer, BILATERAL hydronephrosis  EXAM: RENAL / URINARY TRACT ULTRASOUND COMPLETE  COMPARISON:  CT abdomen and pelvis 08/13/2017  FINDINGS: Right Kidney:  Renal measurements: 8.9 x 4.8 x 3.9 cm = volume: 88 mL. Moderate hydronephrosis. Normal cortical thickness. Slightly increased cortical echogenicity. No definite renal mass or shadowing calcification. Question dilatation of proximal RIGHT ureter. Ureteral stent seen on prior exam not visualized.  Left Kidney:  Renal measurements: 10.0 x 4.7 x 4.5 cm = volume: 110 mL. Cortical thickness normal. Increased cortical echogenicity. Moderate hydronephrosis. Dilatation of proximal LEFT ureter. No renal mass or shadowing calcification. Ureteral stent seen on the prior CT exam no longer identified.  Bladder:  Question minimal wall thickening at the posterior urinary bladder versus dependent debris. Prior CT exam demonstrated bladder wall thickening. No focal mass or calcification. Remainder of bladder unremarkable.  IMPRESSION: BILATERAL moderate hydronephrosis with dilatation of the proximal LEFT and question proximal RIGHT ureter.  Medical renal disease changes of both kidneys.  Bladder wall  thickening.   Electronically Signed   By: Lavonia Dana M.D.   On: 03/08/2018 17:36      Assessment & Plan:    1. Urinary frequency Symptoms stable on Myrbetriq - Urinalysis, Complete  2. Bilateral hydronephrosis Chronic stable, Cr stable  3. Bladder neck contracture Occasionally cathing to keep bladder neck open  4. Chronic bacteriuria Chronically colonized, only treat for symptoms  5. Prostate cancer [facility]) Managed by Dr. Rogue Bussing   F/u 6 months  [name], MD  [facility] [address], [place], [place] [address] [phone]

## 2018-10-11 ENCOUNTER — Other Ambulatory Visit: Payer: Self-pay

## 2018-10-14 ENCOUNTER — Inpatient Hospital Stay: Payer: Medicare Other

## 2018-10-14 ENCOUNTER — Inpatient Hospital Stay (HOSPITAL_BASED_OUTPATIENT_CLINIC_OR_DEPARTMENT_OTHER): Payer: Medicare Other | Admitting: Internal Medicine

## 2018-10-14 ENCOUNTER — Other Ambulatory Visit: Payer: Self-pay

## 2018-10-14 ENCOUNTER — Inpatient Hospital Stay: Payer: Medicare Other | Attending: Internal Medicine

## 2018-10-14 ENCOUNTER — Encounter: Payer: Self-pay | Admitting: Internal Medicine

## 2018-10-14 VITALS — BP 151/90 | HR 76 | Temp 96.8°F | Resp 20 | Ht 66.0 in | Wt 136.0 lb

## 2018-10-14 DIAGNOSIS — E1165 Type 2 diabetes mellitus with hyperglycemia: Secondary | ICD-10-CM | POA: Diagnosis not present

## 2018-10-14 DIAGNOSIS — I10 Essential (primary) hypertension: Secondary | ICD-10-CM | POA: Insufficient documentation

## 2018-10-14 DIAGNOSIS — D509 Iron deficiency anemia, unspecified: Secondary | ICD-10-CM | POA: Insufficient documentation

## 2018-10-14 DIAGNOSIS — C61 Malignant neoplasm of prostate: Secondary | ICD-10-CM | POA: Diagnosis present

## 2018-10-14 DIAGNOSIS — M2550 Pain in unspecified joint: Secondary | ICD-10-CM | POA: Insufficient documentation

## 2018-10-14 DIAGNOSIS — Z794 Long term (current) use of insulin: Secondary | ICD-10-CM | POA: Insufficient documentation

## 2018-10-14 DIAGNOSIS — Z191 Hormone sensitive malignancy status: Secondary | ICD-10-CM | POA: Insufficient documentation

## 2018-10-14 DIAGNOSIS — M549 Dorsalgia, unspecified: Secondary | ICD-10-CM | POA: Diagnosis not present

## 2018-10-14 DIAGNOSIS — E119 Type 2 diabetes mellitus without complications: Secondary | ICD-10-CM | POA: Diagnosis not present

## 2018-10-14 DIAGNOSIS — K219 Gastro-esophageal reflux disease without esophagitis: Secondary | ICD-10-CM | POA: Diagnosis not present

## 2018-10-14 DIAGNOSIS — E785 Hyperlipidemia, unspecified: Secondary | ICD-10-CM | POA: Insufficient documentation

## 2018-10-14 DIAGNOSIS — Z87440 Personal history of urinary (tract) infections: Secondary | ICD-10-CM | POA: Diagnosis not present

## 2018-10-14 DIAGNOSIS — I6522 Occlusion and stenosis of left carotid artery: Secondary | ICD-10-CM | POA: Insufficient documentation

## 2018-10-14 DIAGNOSIS — I2510 Atherosclerotic heart disease of native coronary artery without angina pectoris: Secondary | ICD-10-CM | POA: Insufficient documentation

## 2018-10-14 DIAGNOSIS — Z79899 Other long term (current) drug therapy: Secondary | ICD-10-CM | POA: Diagnosis not present

## 2018-10-14 DIAGNOSIS — G8929 Other chronic pain: Secondary | ICD-10-CM | POA: Diagnosis not present

## 2018-10-14 LAB — COMPREHENSIVE METABOLIC PANEL
ALT: 14 U/L (ref 0–44)
AST: 20 U/L (ref 15–41)
Albumin: 4.1 g/dL (ref 3.5–5.0)
Alkaline Phosphatase: 55 U/L (ref 38–126)
Anion gap: 10 (ref 5–15)
BUN: 19 mg/dL (ref 8–23)
CO2: 24 mmol/L (ref 22–32)
Calcium: 9.4 mg/dL (ref 8.9–10.3)
Chloride: 102 mmol/L (ref 98–111)
Creatinine, Ser: 1.04 mg/dL (ref 0.61–1.24)
GFR calc Af Amer: >60 mL/min (ref >60)
GFR calc non Af Amer: >60 mL/min (ref >60)
Glucose, Bld: 162 mg/dL — ABNORMAL HIGH (ref 70–99)
Potassium: 4.6 mmol/L (ref 3.5–5.1)
Sodium: 136 mmol/L (ref 135–145)
Total Bilirubin: 0.5 mg/dL (ref 0.3–1.2)
Total Protein: 7.9 g/dL (ref 6.5–8.1)

## 2018-10-14 LAB — CBC WITH DIFFERENTIAL/PLATELET
Abs Immature Granulocytes: 0.05 10*3/uL (ref 0.00–0.07)
Basophils Absolute: 0 10*3/uL (ref 0.0–0.1)
Basophils Relative: 0 %
Eosinophils Absolute: 0.1 10*3/uL (ref 0.0–0.5)
Eosinophils Relative: 2 %
HCT: 34.7 % — ABNORMAL LOW (ref 39.0–52.0)
Hemoglobin: 11.8 g/dL — ABNORMAL LOW (ref 13.0–17.0)
Immature Granulocytes: 1 %
Lymphocytes Relative: 24 %
Lymphs Abs: 1.7 10*3/uL (ref 0.7–4.0)
MCH: 31.9 pg (ref 26.0–34.0)
MCHC: 34 g/dL (ref 30.0–36.0)
MCV: 93.8 fL (ref 80.0–100.0)
Monocytes Absolute: 0.8 10*3/uL (ref 0.1–1.0)
Monocytes Relative: 11 %
Neutro Abs: 4.4 10*3/uL (ref 1.7–7.7)
Neutrophils Relative %: 62 %
Platelets: 216 10*3/uL (ref 150–400)
RBC: 3.7 MIL/uL — ABNORMAL LOW (ref 4.22–5.81)
RDW: 12.3 % (ref 11.5–15.5)
WBC: 7 10*3/uL (ref 4.0–10.5)
nRBC: 0 % (ref 0.0–0.2)

## 2018-10-14 LAB — PSA: Prostatic Specific Antigen: 0.14 ng/mL (ref 0.00–4.00)

## 2018-10-14 NOTE — Assessment & Plan Note (Addendum)
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 Lupron [8m q 46m; last 03/04/2018; On Xtandi; February 2020-CT scan shows continued significant improvement of the retroperitoneal adenopathy; bladder/rectal invasion.;  Diffuse diffuse osseous metastatic disease-STABLE  #Continue Xtandi 80 mg a day [secondary to [place]; July PSA- stable/ 0.18.     # prostate cancer mets to bone-hold Zometa; poor tolerance because of joint pains bone pain. HOLD off for now.  [last 2/21].    # Anemia-iron deficiency-hemoglobin 11.7-overall stable  #Discussed with patient's daughter over the phone at length.  # DISPOSITION:  # HOLD Zometa today # Follow up in 6  weeks-MD/labs-cbc/cmp/psa-Dr.B  Cc: Dr. Ginette Pitman

## 2018-10-14 NOTE — Progress Notes (Signed)
[place] OFFICE PROGRESS NOTE  Patient Care Team: [name], MD as PCP - General (Internal Medicine)  Cancer Staging No matching staging information was found for the patient.   Oncology History Overview Note  # FEB 2019-Metastatic prostate cancer/ castrate sensitive [bulky retroperitoneal adenopathy; invasion into the bladder; rectum] Bladder Bx- prostate. On Degarelix  # April 3rd 2019- [name] [125md];  Lupron q 6 M [[id number]]; MID AUG 2019- reduced dose to 825mday  # Poorly controlled DM-on insulin  # s/p right PCN [explanted]/ Foley cath [Dr.Brandon]; multiple UTIs.  ----------------------------------------------------------------- DIAGNOSIS: [ FEB 2019] MET. PROSTATE CA  STAGE:   IV ;GOALS: PALLIATIVE  CURRENT/MOST RECENT THERAPY [ Feb-April2019] Lupron+ [name]    [facility]     INTERVAL HISTORY:  GoYOREL REDDER89.o.  male pleasant patient [name] is here for follow-up.  Status post recent evaluation with urology.  Patient continues to have mild fatigue.  No shortness of breath at rest.  However gets short of breath on exertion/climbing steps.  No chest pain.  No fever no chills.  Chronic back pain joint pain started worse.    Review of Systems  Constitutional: Positive for malaise/fatigue. Negative for chills, diaphoresis, fever and weight loss.  HENT: Negative for nosebleeds and sore throat.   Eyes: Negative for double vision.  Respiratory: Negative for cough, hemoptysis, sputum production, shortness of breath and wheezing.   Cardiovascular: Negative for chest pain, palpitations, orthopnea and leg swelling.  Gastrointestinal: Negative for abdominal pain, blood in stool, constipation, diarrhea, heartburn, melena, nausea and vomiting.  Musculoskeletal: Positive for back pain and joint pain.  Skin: Negative.  Negative for itching and rash.   Neurological: Negative for dizziness, tingling, focal weakness, weakness and headaches.  Endo/Heme/Allergies: Does not bruise/bleed easily.  Psychiatric/Behavioral: Negative for depression. The patient is not nervous/anxious and does not have insomnia.       PAST MEDICAL HISTORY :  Past Medical History:  Diagnosis Date  . Anemia    iron deficiency  . Benign prostatic hypertrophy   . Bilateral carotid artery disease (HCC)    Mild plaque formation without obstructive disease noted on carotid Doppler.  . Coronary artery disease    [facility] The patient was told about 2 blockages which did not require revascularization.  . Diabetes [place] ([place]  . Essential hypertension   . GERD (gastroesophageal reflux disease)   . Hyperlipidemia   . Hypertension   . Left carotid artery stenosis   . [place]    PAST SURGICAL HISTORY :   Past Surgical History:  Procedure Laterality Date  . CARDIAC CATHETERIZATION  2010  . CARDIAC CATHETERIZATION N/A 02/03/2015   Procedure: Left Heart Cath and Coronary Angiography;  Surgeon: MuWellington HampshireMD;  [facility];  Service: Cardiovascular;  Laterality: N/A;  . CATARACT EXTRACTION Bilateral   . CYSTOSCOPY W/ RETROGRADES Bilateral 04/11/2017   Procedure: CYSTOSCOPY WITH RETROGRADE PYELOGRAM;  Surgeon: [name];  Location: [facility];  Service: Urology;  Laterality: Bilateral;  . CYSTOSCOPY W/ RETROGRADES Bilateral 09/12/2017   Procedure: CYSTOSCOPY WITH RETROGRADE PYELOGRAM;  Surgeon: [name];  Location: [facility];  Service: Urology;  Laterality: Bilateral;  . CYSTOSCOPY W/ RETROGRADES Bilateral 02/25/2018   Procedure: CYSTOSCOPY WITH RETROGRADE PYELOGRAM;  Surgeon: [name];  Location: [facility];  Service: Urology;  Laterality: Bilateral;  .  CYSTOSCOPY W/ URETERAL STENT PLACEMENT Bilateral 09/12/2017   Procedure: CYSTOSCOPY  WITH STENT REPLACEMENT;  Surgeon: [name], MD;  Location: [facility];  Service: Urology;  Laterality: Bilateral;  . CYSTOSCOPY W/ URETERAL STENT REMOVAL Bilateral 02/25/2018   Procedure: CYSTOSCOPY WITH STENT REMOVAL;  Surgeon: [name], MD;  Location: [facility];  Service: Urology;  Laterality: Bilateral;  . CYSTOSCOPY WITH STENT PLACEMENT Left 04/11/2017   Procedure: CYSTOSCOPY WITH STENT PLACEMENT and fulgeration;  Surgeon: [name], MD;  Location: [facility];  Service: Urology;  Laterality: Left;  . CYSTOSCOPY WITH URETHRAL DILATATION Bilateral 04/11/2017   Procedure: CYSTOSCOPY WITH URETHRAL DILATATION;  Surgeon: [name], MD;  Location: [facility];  Service: Urology;  Laterality: Bilateral;  . IR CONVERT RIGHT NEPHROSTOMY TO NEPHROURETERAL CATH  05/21/2017  . IR NEPHROSTOMY PLACEMENT RIGHT  04/13/2017    FAMILY HISTORY :   Family History  Problem Relation Age of Onset  . Hypertension Father     SOCIAL HISTORY:   Social History   Tobacco Use  . Smoking status: Never Smoker  . Smokeless tobacco: Never Used  Substance Use Topics  . Alcohol use: No  . Drug use: No    ALLERGIES:  has No Known Allergies.  MEDICATIONS:  Current Outpatient Medications  Medication Sig Dispense Refill  . atorvastatin (LIPITOR) 10 MG tablet Take 10 mg by mouth daily at 6 PM.     . BD PEN NEEDLE MICRO U/F 32G X 6 MM MISC     . Calcium Carb-Cholecalciferol (CALCIUM 600+D3) 600-800 MG-UNIT TABS Take 1 tablet by mouth every evening.     [name] glimepiride (AMARYL) 2 MG tablet Take 2 mg by mouth daily with breakfast.    . glucose blood (ACCU-CHEK GUIDE) test strip Use asdirected three times a day diag E11.65 100 each 3  . insulin glargine (LANTUS) 100 UNIT/ML injection Inject 6 Units into the skin every morning. Based on sugar levels in am.     . iron polysaccharides (NIFEREX) 150 MG capsule Take 150 mg by mouth daily.     . metFORMIN (GLUCOPHAGE) 1000 MG tablet Take 1,000 mg by mouth every  evening. With dinner    . metoprolol succinate (TOPROL-XL) 25 MG 24 hr tablet TAKE 1 TABLET BY MOUTH EVERY DAY FOR BLOOD PRESSURE (Patient taking differently: Take 25 mg by mouth every evening. ) 90 tablet 3  . mirabegron ER (MYRBETRIQ) 25 MG TB24 tablet Take 1 tablet (25 mg total) by mouth daily. 30 tablet 11  . Multiple Vitamins-Minerals (MULTIVITAMIN WITH MINERALS) tablet Take 1 tablet by mouth daily.     [name] omeprazole (PRILOSEC) 20 MG capsule TAKE ONE CAPSULE BY MOUTH EVERY DAY (Patient taking differently: Take 20 mg by mouth daily. ) 90 capsule 2  . XTANDI 40 MG capsule TAKE 2 CAPSULES (80 MG TOTAL) BY MOUTH DAILY. 60 capsule 3  . acetaminophen (TYLENOL) 500 MG tablet Take 500 mg by mouth every 4 (four) hours as needed for moderate pain or fever.    [name] albuterol (PROVENTIL HFA;VENTOLIN HFA) 108 (90 Base) MCG/ACT inhaler Inhale 2 puffs into the lungs every 4 (four) hours as needed for wheezing or shortness of breath.    [name] ibuprofen (ADVIL,MOTRIN) 200 MG tablet Take 200 mg by mouth every 6 (six) hours as needed for fever or moderate pain.      No current facility-administered medications for this visit.     PHYSICAL EXAMINATION: ECOG PERFORMANCE STATUS: 1 - Symptomatic but completely ambulatory  BP (!) 151/90  Pulse 76   Temp (!) 96.8 F (36 C) (Tympanic)   Resp 20   Ht '5\' 6"'  (1.676 m)   Wt 136 lb (61.7 kg)   BMI 21.95 kg/m   Filed Weights   10/14/18 0948  Weight: 136 lb (61.7 kg)    Physical Exam  Constitutional: He is oriented to person, place, and time and well-developed, well-nourished, and in no distress.  He is alone.  Walking himself.  HENT:  Head: Normocephalic and atraumatic.  Mouth/Throat: Oropharynx is clear and moist. No oropharyngeal exudate.  Eyes: Pupils are equal, round, and reactive to light.  Neck: Normal range of motion. Neck supple.  Cardiovascular: Normal rate and regular rhythm.  Pulmonary/Chest: Effort normal and breath sounds normal. No respiratory  distress. He has no wheezes.  Abdominal: Soft. Bowel sounds are normal. He exhibits no distension and no mass. There is no abdominal tenderness. There is no rebound and no guarding.  Musculoskeletal: Normal range of motion.        General: No tenderness or edema.  Neurological: He is alert and oriented to person, place, and time.  Skin: Skin is warm.  Psychiatric: Affect normal.       LABORATORY DATA:  I have reviewed the data as listed    Component Value Date/Time   NA 136 10/14/2018 0916   NA 136 05/07/2012 1328   K 4.6 10/14/2018 0916   K 4.5 05/07/2012 1328   CL 102 10/14/2018 0916   CL 104 05/07/2012 1328   CO2 24 10/14/2018 0916   CO2 28 05/07/2012 1328   GLUCOSE 162 (H) 10/14/2018 0916   GLUCOSE 73 05/07/2012 1328   BUN 19 10/14/2018 0916   BUN 16 05/07/2012 1328   CREATININE 1.04 10/14/2018 0916   CREATININE 0.84 05/07/2012 1328   CALCIUM 9.4 10/14/2018 0916   CALCIUM 8.8 05/07/2012 1328   PROT 7.9 10/14/2018 0916   ALBUMIN 4.1 10/14/2018 0916   AST 20 10/14/2018 0916   ALT 14 10/14/2018 0916   ALKPHOS 55 10/14/2018 0916   BILITOT 0.5 10/14/2018 0916   GFRNONAA >60 10/14/2018 0916   GFRNONAA >60 05/07/2012 1328   GFRAA >60 10/14/2018 0916   GFRAA >60 05/07/2012 1328    No results found for: SPEP, UPEP  Lab Results  Component Value Date   WBC 7.0 10/14/2018   NEUTROABS 4.4 10/14/2018   HGB 11.8 (L) 10/14/2018   HCT 34.7 (L) 10/14/2018   MCV 93.8 10/14/2018   PLT 216 10/14/2018      Chemistry      Component Value Date/Time   NA 136 10/14/2018 0916   NA 136 05/07/2012 1328   K 4.6 10/14/2018 0916   K 4.5 05/07/2012 1328   CL 102 10/14/2018 0916   CL 104 05/07/2012 1328   CO2 24 10/14/2018 0916   CO2 28 05/07/2012 1328   BUN 19 10/14/2018 0916   BUN 16 05/07/2012 1328   CREATININE 1.04 10/14/2018 0916   CREATININE 0.84 05/07/2012 1328      Component Value Date/Time   CALCIUM 9.4 10/14/2018 0916   CALCIUM 8.8 05/07/2012 1328   ALKPHOS 55  10/14/2018 0916   AST 20 10/14/2018 0916   ALT 14 10/14/2018 0916   BILITOT 0.5 10/14/2018 0916       RADIOGRAPHIC STUDIES: I have personally reviewed the radiological images as listed and agreed with the findings in the report. No results found.   ASSESSMENT & PLAN:  Prostate cancer [facility]) # Metastatic prostate cancer/ castrate  sensitive [bulky retroperitoneal adenopathy; invasion into the bladder; rectum]. Lupron [59mq 646mlast 03/04/2018; On Xtandi; February 2020-CT scan shows continued significant improvement of the retroperitoneal adenopathy; bladder/rectal invasion.;  Diffuse diffuse osseous metastatic disease-STABLE  #Continue Xtandi 80 mg a day [place]- [place]/ 0.18.     # prostate cancer mets to bone-hold Zometa; poor tolerance because of joint pains bone pain. HOLD off for now.  [last 2/21].    # Anemia-iron deficiency-hemoglobin 11.7-overall stable  #Discussed with patient's daughter over the phone at length.  # DISPOSITION:  # HOLD Zometa today # Follow up in 6  weeks-MD/labs-cbc/cmp/psa-Dr.B  Cc: Dr. HaGinette Pitman Orders Placed This Encounter  Procedures  . CBC with Differential    Standing Status:   Future    Standing Expiration Date:   10/14/2019  . Comprehensive metabolic panel    Standing Status:   Future    Standing Expiration Date:   10/14/2019  . PSA    Standing Status:   Future    Standing Expiration Date:   10/14/2019   All questions were answered. The patient knows to call the clinic with any problems, questions or concerns.      GoCammie SickleMD 10/14/2018 1:23 PM

## 2018-11-05 ENCOUNTER — Other Ambulatory Visit: Payer: Self-pay | Admitting: Urology

## 2018-11-05 ENCOUNTER — Other Ambulatory Visit: Payer: Self-pay | Admitting: Internal Medicine

## 2018-11-05 DIAGNOSIS — R339 Retention of urine, unspecified: Secondary | ICD-10-CM

## 2018-11-05 DIAGNOSIS — C61 Malignant neoplasm of prostate: Secondary | ICD-10-CM

## 2018-11-05 MED FILL — XTANDI 40 MG CAPSULE: 40 | 30 days supply | Qty: 60 | Fill #1

## 2018-11-14 ENCOUNTER — Other Ambulatory Visit: Payer: Self-pay | Admitting: Internal Medicine

## 2018-11-22 ENCOUNTER — Encounter: Payer: Self-pay | Admitting: Internal Medicine

## 2018-11-24 NOTE — Progress Notes (Signed)
[facility] OFFICE PROGRESS NOTE  Patient Care Team: [name], MD as PCP - General (Internal Medicine)  Cancer Staging No matching staging information was found for the patient.   Oncology History Overview Note  # FEB 2019-Metastatic prostate cancer/ castrate sensitive [bulky retroperitoneal adenopathy; invasion into the bladder; rectum] Bladder Bx- prostate. On Degarelix  # April 3rd 2019- [name] [190md];  Lupron q 6 M [[id number]]; MID AUG 2019- reduced dose to 871mday  # Poorly controlled DM-on insulin  # s/p right PCN [explanted]/ Foley cath [Dr.Brandon]; multiple UTIs.  ----------------------------------------------------------------- DIAGNOSIS: [ FEB 2019] MET. PROSTATE CA  STAGE:   IV ;GOALS: PALLIATIVE  CURRENT/MOST RECENT THERAPY [ Feb-April2019] Lupron+ [name]    [facility]     INTERVAL HISTORY:  GoRUPERT AZZARA89.o.  male pleasant patient [name] is here for follow-up.  Patient denies any fevers or chills.  However continues to get short of breath on exertion.  No chest pain.   Review of Systems  Constitutional: Positive for malaise/fatigue. Negative for chills, diaphoresis, fever and weight loss.  HENT: Negative for nosebleeds and sore throat.   Eyes: Negative for double vision.  Respiratory: Positive for shortness of breath. Negative for cough, hemoptysis, sputum production and wheezing.   Cardiovascular: Negative for chest pain, palpitations, orthopnea and leg swelling.  Gastrointestinal: Negative for abdominal pain, blood in stool, constipation, diarrhea, heartburn, melena, nausea and vomiting.  Musculoskeletal: Positive for back pain and joint pain.  Skin: Negative.  Negative for itching and rash.  Neurological: Negative for dizziness, tingling, focal weakness, weakness and headaches.  Endo/Heme/Allergies: Does not bruise/bleed  easily.  Psychiatric/Behavioral: Negative for depression. The patient is not nervous/anxious and does not have insomnia.       PAST MEDICAL HISTORY :  Past Medical History:  Diagnosis Date  . Anemia    iron deficiency  . Benign prostatic hypertrophy   . Bilateral carotid artery disease (HCC)    Mild plaque formation without obstructive disease noted on carotid Doppler.  . Coronary artery disease    [facility] The patient was told about 2 blockages which did not require revascularization.  . [place]  . Essential hypertension   . GERD (gastroesophageal reflux disease)   . Hyperlipidemia   . Hypertension   . Left carotid artery stenosis   . [place]    PAST SURGICAL HISTORY :   Past Surgical History:  Procedure Laterality Date  . CARDIAC CATHETERIZATION  2010  . CARDIAC CATHETERIZATION N/A 02/03/2015   Procedure: Left Heart Cath and Coronary Angiography;  Surgeon: MuWellington HampshireMD;  [facility];  Service: Cardiovascular;  Laterality: N/A;  . CATARACT EXTRACTION Bilateral   . CYSTOSCOPY W/ RETROGRADES Bilateral 04/11/2017   Procedure: CYSTOSCOPY WITH RETROGRADE PYELOGRAM;  Surgeon: [name];  Location: [facility];  Service: Urology;  Laterality: Bilateral;  . CYSTOSCOPY W/ RETROGRADES Bilateral 09/12/2017   Procedure: CYSTOSCOPY WITH RETROGRADE PYELOGRAM;  Surgeon: [name];  Location: [facility];  Service: Urology;  Laterality: Bilateral;  . CYSTOSCOPY W/ RETROGRADES Bilateral 02/25/2018   Procedure: CYSTOSCOPY WITH RETROGRADE PYELOGRAM;  Surgeon: [name];  Location: [facility];  Service: Urology;  Laterality: Bilateral;  . CYSTOSCOPY W/ URETERAL STENT PLACEMENT Bilateral 09/12/2017   Procedure: CYSTOSCOPY WITH STENT REPLACEMENT;  Surgeon: [name];  Location: [facility] OR[facility];  Service:  Urology;  Laterality: Bilateral;  . CYSTOSCOPY W/  URETERAL STENT REMOVAL Bilateral 02/25/2018   Procedure: CYSTOSCOPY WITH STENT REMOVAL;  Surgeon: [name], MD;  Location: [facility];  Service: Urology;  Laterality: Bilateral;  . CYSTOSCOPY WITH STENT PLACEMENT Left 04/11/2017   Procedure: CYSTOSCOPY WITH STENT PLACEMENT and fulgeration;  Surgeon: [name], MD;  Location: [facility];  Service: Urology;  Laterality: Left;  . CYSTOSCOPY WITH URETHRAL DILATATION Bilateral 04/11/2017   Procedure: CYSTOSCOPY WITH URETHRAL DILATATION;  Surgeon: [name], MD;  Location: [facility];  Service: Urology;  Laterality: Bilateral;  . IR CONVERT RIGHT NEPHROSTOMY TO NEPHROURETERAL CATH  05/21/2017  . IR NEPHROSTOMY PLACEMENT RIGHT  04/13/2017    FAMILY HISTORY :   Family History  Problem Relation Age of Onset  . Hypertension Father     SOCIAL HISTORY:   Social History   Tobacco Use  . Smoking status: Never Smoker  . Smokeless tobacco: Never Used  Substance Use Topics  . Alcohol use: No  . Drug use: No    ALLERGIES:  has No Known Allergies.  MEDICATIONS:  Current Outpatient Medications  Medication Sig Dispense Refill  . acetaminophen (TYLENOL) 500 MG tablet Take 500 mg by mouth every 4 (four) hours as needed for moderate pain or fever.    [name] albuterol (PROVENTIL HFA;VENTOLIN HFA) 108 (90 Base) MCG/ACT inhaler Inhale 2 puffs into the lungs every 4 (four) hours as needed for wheezing or shortness of breath.    [name] atorvastatin (LIPITOR) 10 MG tablet Take 10 mg by mouth daily at 6 PM.     . BD PEN NEEDLE MICRO U/F 32G X 6 MM MISC     . Calcium Carb-Cholecalciferol (CALCIUM 600+D3) 600-800 MG-UNIT TABS Take 1 tablet by mouth every evening.     [name] glimepiride (AMARYL) 2 MG tablet Take 2 mg by mouth daily with breakfast.    . glucose blood (ACCU-CHEK GUIDE) test strip Use asdirected three times a day diag E11.65 100 each 3  . ibuprofen (ADVIL,MOTRIN) 200 MG tablet Take 200 mg by mouth every 6 (six) hours as needed for fever or moderate pain.      [name] insulin glargine (LANTUS) 100 UNIT/ML injection Inject 6 Units into the skin every morning. Based on sugar levels in am.     . iron polysaccharides (NIFEREX) 150 MG capsule Take 150 mg by mouth daily.     . metFORMIN (GLUCOPHAGE) 1000 MG tablet Take 1,000 mg by mouth every evening. With dinner    . metoprolol succinate (TOPROL-XL) 25 MG 24 hr tablet TAKE 1 TABLET BY MOUTH EVERY DAY FOR BLOOD PRESSURE (Patient taking differently: Take 25 mg by mouth every evening. ) 90 tablet 3  . Multiple Vitamins-Minerals (MULTIVITAMIN WITH MINERALS) tablet Take 1 tablet by mouth daily.     [name] MYRBETRIQ 25 MG TB24 tablet TAKE 1 TABLET BY MOUTH EVERY DAY 30 tablet 11  . omeprazole (PRILOSEC) 20 MG capsule TAKE ONE CAPSULE BY MOUTH EVERY DAY (Patient taking differently: Take 20 mg by mouth daily. ) 90 capsule 2  . XTANDI 40 MG capsule TAKE 2 CAPSULES (80 MG TOTAL) BY MOUTH DAILY. 60 capsule 3   No current facility-administered medications for this visit.     PHYSICAL EXAMINATION: ECOG PERFORMANCE STATUS: 1 - Symptomatic but completely ambulatory  BP (!) 142/93 (BP Location: Left Arm, Patient Position: Sitting, Cuff Size: Normal)   Pulse 79   Temp (!) 96.8 F (36 C) (Tympanic)   Resp 20   Ht  _0  (1.676 m)   Wt 138 lb (62.6 kg)   BMI 22.27 kg/m   Filed Weights   11/25/18 0950  Weight: 138 lb (62.6 kg)    Physical Exam  Constitutional: He is oriented to person, place, and time and well-developed, well-nourished, and in no distress.  He is alone.  Walking himself.  HENT:  Head: Normocephalic and atraumatic.  Mouth/Throat: Oropharynx is clear and moist. No oropharyngeal exudate.  Eyes: Pupils are equal, round, and reactive to light.  Neck: Normal range of motion. Neck supple.  Cardiovascular: Normal rate and regular rhythm.  Pulmonary/Chest: Effort normal and breath sounds normal. No respiratory distress. He has no wheezes.  Abdominal: Soft. Bowel sounds are normal. He exhibits no distension  and no mass. There is no abdominal tenderness. There is no rebound and no guarding.  Musculoskeletal: Normal range of motion.        General: No tenderness or edema.  Neurological: He is alert and oriented to person, place, and time.  Skin: Skin is warm.  Psychiatric: Affect normal.       LABORATORY DATA:  I have reviewed the data as listed    Component Value Date/Time   NA 137 11/25/2018 0903   NA 136 05/07/2012 1328   K 4.2 11/25/2018 0903   K 4.5 05/07/2012 1328   CL 102 11/25/2018 0903   CL 104 05/07/2012 1328   CO2 25 11/25/2018 0903   CO2 28 05/07/2012 1328   GLUCOSE 115 (H) 11/25/2018 0903   GLUCOSE 73 05/07/2012 1328   BUN 18 11/25/2018 0903   BUN 16 05/07/2012 1328   CREATININE 1.04 11/25/2018 0903   CREATININE 0.84 05/07/2012 1328   CALCIUM 9.4 11/25/2018 0903   CALCIUM 8.8 05/07/2012 1328   PROT 8.0 11/25/2018 0903   ALBUMIN 4.2 11/25/2018 0903   AST 20 11/25/2018 0903   ALT 16 11/25/2018 0903   ALKPHOS 59 11/25/2018 0903   BILITOT 0.5 11/25/2018 0903   GFRNONAA >60 11/25/2018 0903   GFRNONAA >60 05/07/2012 1328   GFRAA >60 11/25/2018 0903   GFRAA >60 05/07/2012 1328    No results found for: SPEP, UPEP  Lab Results  Component Value Date   WBC 6.8 11/25/2018   NEUTROABS 4.0 11/25/2018   HGB 12.2 (L) 11/25/2018   HCT 36.9 (L) 11/25/2018   MCV 93.9 11/25/2018   PLT 239 11/25/2018      Chemistry      Component Value Date/Time   NA 137 11/25/2018 0903   NA 136 05/07/2012 1328   K 4.2 11/25/2018 0903   K 4.5 05/07/2012 1328   CL 102 11/25/2018 0903   CL 104 05/07/2012 1328   CO2 25 11/25/2018 0903   CO2 28 05/07/2012 1328   BUN 18 11/25/2018 0903   BUN 16 05/07/2012 1328   CREATININE 1.04 11/25/2018 0903   CREATININE 0.84 05/07/2012 1328      Component Value Date/Time   CALCIUM 9.4 11/25/2018 0903   CALCIUM 8.8 05/07/2012 1328   ALKPHOS 59 11/25/2018 0903   AST 20 11/25/2018 0903   ALT 16 11/25/2018 0903   BILITOT 0.5 11/25/2018 0903        RADIOGRAPHIC STUDIES: I have personally reviewed the radiological images as listed and agreed with the findings in the report. No results found.   ASSESSMENT & PLAN:  Prostate cancer [facility]) # Metastatic prostate cancer/ castrate sensitive [bulky retroperitoneal adenopathy; invasion into the bladder; rectum]. Lupron [38mq 661mlast 09/02/2018; On Xtandi; February  2020-CT scan shows continued significant improvement of the retroperitoneal adenopathy; bladder/rectal invasion.;  Diffuse diffuse osseous metastatic disease-STABLE.   #Continue Xtandi 80 mg a day [secondary to fatigue]; AUG PSA- stable/ 0.14.   # prostate cancer mets to bone-hold Zometa; poor tolerance because of joint pains bone pain. HOLD off for now.  [last 2/21].    # Anemia-iron deficiency-hemoglobin 12.3-overall stable  # Dyspnea on exertion-unlikely from anemia as hemoglobin is 12.  Recommend cardiac evaluation/daughter awaiting to speak to cardiology office regarding appointment.  #Discussed with patient's daughter,Hina over the phone at length.  # DISPOSITION:  # Follow up in 6  weeks-MD/labs-cbc/cmp/psa-Dr.B    Orders Placed This Encounter  Procedures  . CBC with Differential    Standing Status:   Future    Standing Expiration Date:   11/25/2019  . Comprehensive metabolic panel    Standing Status:   Future    Standing Expiration Date:   11/25/2019  . PSA    Standing Status:   Future    Standing Expiration Date:   11/25/2019   All questions were answered. The patient knows to call the clinic with any problems, questions or concerns.      [name], MD 12/01/2018 8:59 PM

## 2018-11-24 NOTE — Assessment & Plan Note (Addendum)
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 Lupron [42m q 48m; last 09/02/2018; On Xtandi; February 2020-CT scan shows continued significant improvement of the retroperitoneal adenopathy; bladder/rectal invasion.;  Diffuse diffuse osseous metastatic disease-STABLE.   #Continue Xtandi 80 mg a day [secondary to fatigue]; AUG PSA- stable/ 0.14.   # prostate cancer mets to bone-hold Zometa; poor tolerance because of joint pains bone pain. HOLD off for now.  [last 2/21].    # Anemia-iron deficiency-hemoglobin 12.3-overall stable  # Dyspnea on exertion-unlikely from anemia as hemoglobin is 12.  Recommend cardiac evaluation/daughter awaiting to speak to cardiology office regarding appointment.  #Discussed with patient's daughter,Michael [name] over the phone at length.  # DISPOSITION:  # Follow up in 6  weeks-MD/labs-cbc/cmp/psa-Dr.B

## 2018-11-25 ENCOUNTER — Inpatient Hospital Stay (HOSPITAL_BASED_OUTPATIENT_CLINIC_OR_DEPARTMENT_OTHER): Payer: Medicare Other | Admitting: Internal Medicine

## 2018-11-25 ENCOUNTER — Other Ambulatory Visit: Payer: Self-pay

## 2018-11-25 ENCOUNTER — Inpatient Hospital Stay: Payer: Medicare Other | Attending: Internal Medicine

## 2018-11-25 DIAGNOSIS — Z191 Hormone sensitive malignancy status: Secondary | ICD-10-CM | POA: Diagnosis not present

## 2018-11-25 DIAGNOSIS — Z79899 Other long term (current) drug therapy: Secondary | ICD-10-CM | POA: Diagnosis not present

## 2018-11-25 DIAGNOSIS — I2510 Atherosclerotic heart disease of native coronary artery without angina pectoris: Secondary | ICD-10-CM | POA: Diagnosis not present

## 2018-11-25 DIAGNOSIS — Z794 Long term (current) use of insulin: Secondary | ICD-10-CM | POA: Diagnosis not present

## 2018-11-25 DIAGNOSIS — D509 Iron deficiency anemia, unspecified: Secondary | ICD-10-CM | POA: Diagnosis not present

## 2018-11-25 DIAGNOSIS — I10 Essential (primary) hypertension: Secondary | ICD-10-CM | POA: Diagnosis not present

## 2018-11-25 DIAGNOSIS — C7951 Secondary malignant neoplasm of bone: Secondary | ICD-10-CM | POA: Insufficient documentation

## 2018-11-25 DIAGNOSIS — E119 Type 2 diabetes mellitus without complications: Secondary | ICD-10-CM | POA: Insufficient documentation

## 2018-11-25 DIAGNOSIS — K219 Gastro-esophageal reflux disease without esophagitis: Secondary | ICD-10-CM | POA: Diagnosis not present

## 2018-11-25 DIAGNOSIS — R0602 Shortness of breath: Secondary | ICD-10-CM | POA: Diagnosis not present

## 2018-11-25 DIAGNOSIS — E785 Hyperlipidemia, unspecified: Secondary | ICD-10-CM | POA: Insufficient documentation

## 2018-11-25 DIAGNOSIS — C61 Malignant neoplasm of prostate: Secondary | ICD-10-CM | POA: Insufficient documentation

## 2018-11-25 DIAGNOSIS — E1165 Type 2 diabetes mellitus with hyperglycemia: Secondary | ICD-10-CM | POA: Diagnosis not present

## 2018-11-25 LAB — CBC WITH DIFFERENTIAL/PLATELET
Abs Immature Granulocytes: 0.04 10*3/uL (ref 0.00–0.07)
Basophils Absolute: 0 10*3/uL (ref 0.0–0.1)
Basophils Relative: 0 %
Eosinophils Absolute: 0.1 10*3/uL (ref 0.0–0.5)
Eosinophils Relative: 1 %
HCT: 36.9 % — ABNORMAL LOW (ref 39.0–52.0)
Hemoglobin: 12.2 g/dL — ABNORMAL LOW (ref 13.0–17.0)
Immature Granulocytes: 1 %
Lymphocytes Relative: 27 %
Lymphs Abs: 1.9 10*3/uL (ref 0.7–4.0)
MCH: 31 pg (ref 26.0–34.0)
MCHC: 33.1 g/dL (ref 30.0–36.0)
MCV: 93.9 fL (ref 80.0–100.0)
Monocytes Absolute: 0.8 10*3/uL (ref 0.1–1.0)
Monocytes Relative: 12 %
Neutro Abs: 4 10*3/uL (ref 1.7–7.7)
Neutrophils Relative %: 59 %
Platelets: 239 10*3/uL (ref 150–400)
RBC: 3.93 MIL/uL — ABNORMAL LOW (ref 4.22–5.81)
RDW: 12.3 % (ref 11.5–15.5)
WBC: 6.8 10*3/uL (ref 4.0–10.5)
nRBC: 0 % (ref 0.0–0.2)

## 2018-11-25 LAB — COMPREHENSIVE METABOLIC PANEL
ALT: 16 U/L (ref 0–44)
AST: 20 U/L (ref 15–41)
Albumin: 4.2 g/dL (ref 3.5–5.0)
Alkaline Phosphatase: 59 U/L (ref 38–126)
Anion gap: 10 (ref 5–15)
BUN: 18 mg/dL (ref 8–23)
CO2: 25 mmol/L (ref 22–32)
Calcium: 9.4 mg/dL (ref 8.9–10.3)
Chloride: 102 mmol/L (ref 98–111)
Creatinine, Ser: 1.04 mg/dL (ref 0.61–1.24)
GFR calc Af Amer: >60 mL/min (ref >60)
GFR calc non Af Amer: >60 mL/min (ref >60)
Glucose, Bld: 115 mg/dL — ABNORMAL HIGH (ref 70–99)
Potassium: 4.2 mmol/L (ref 3.5–5.1)
Sodium: 137 mmol/L (ref 135–145)
Total Bilirubin: 0.5 mg/dL (ref 0.3–1.2)
Total Protein: 8 g/dL (ref 6.5–8.1)

## 2018-11-25 LAB — PSA: Prostatic Specific Antigen: 0.09 ng/mL (ref 0.00–4.00)

## 2018-12-06 MED FILL — XTANDI 40 MG CAPSULE: 40 | 30 days supply | Qty: 60 | Fill #2

## 2019-01-02 ENCOUNTER — Encounter: Payer: Self-pay | Admitting: Cardiovascular Disease

## 2019-01-02 ENCOUNTER — Ambulatory Visit (INDEPENDENT_AMBULATORY_CARE_PROVIDER_SITE_OTHER): Payer: Medicare Other | Admitting: Cardiovascular Disease

## 2019-01-02 ENCOUNTER — Other Ambulatory Visit: Payer: Self-pay

## 2019-01-02 VITALS — BP 130/72 | HR 77 | Temp 97.0°F | Ht 64.0 in | Wt 137.5 lb

## 2019-01-02 DIAGNOSIS — I10 Essential (primary) hypertension: Secondary | ICD-10-CM | POA: Diagnosis not present

## 2019-01-02 DIAGNOSIS — E785 Hyperlipidemia, unspecified: Secondary | ICD-10-CM | POA: Diagnosis not present

## 2019-01-02 DIAGNOSIS — R0602 Shortness of breath: Secondary | ICD-10-CM

## 2019-01-02 DIAGNOSIS — I25118 Atherosclerotic heart disease of native coronary artery with other forms of angina pectoris: Secondary | ICD-10-CM | POA: Diagnosis not present

## 2019-01-02 NOTE — Progress Notes (Signed)
Cardiology Office Note   Date:  01/02/2019   ID:  [name], DOB [birth year], MRN [id number]  PCP:  [name], MD  Cardiologist:   [name], MD   Chief Complaint  Patient presents with  . other    12 month follow up. Meds reviewed by the pt. verbally. Pt. c/o shortness of breath with little exertion.       History of Present Illness: Michael [name] is a 85 y.o. male who presents for a follow-up visit regarding moderate calcified nonobstructive one-vessel coronary artery disease.  He has chronic medical conditions that include type 2 diabetes, hypertension, mild carotid disease and  hyperlipidemia. Recent  He was seen in 2016 for abnormal stress test.  Cardiac catheterization was done in December 2016 which showed 40-50% heavily calcified stenosis in the mid to distal LAD. Ejection fraction was normal and left ventricular end-diastolic pressure was mildly elevated. The patient has been treated with atorvastatin. The patient was diagnosed with prostate cancer in early 2019.  He is currently on hormone therapy.  Over the last 6 to 12 months, he has experienced progressive exertional dyspnea which is now happening with minimal exertion.  No chest pain.  No orthopnea, PND or leg edema.   Past Medical History:  Diagnosis Date  . Anemia    iron deficiency  . Benign prostatic hypertrophy   . Bilateral carotid artery disease (HCC)    Mild plaque formation without obstructive disease noted on carotid Doppler.  . Coronary artery disease    Previous cardiac catheterization at [facility] in 2010. The patient was told about 2 blockages which did not require revascularization.  . Diabetes mellitus without complication ([place])   . Essential hypertension   . GERD (gastroesophageal reflux disease)   . Hyperlipidemia   . Hypertension   . Left carotid artery stenosis   . Prostate cancer ([place]) 03/2017    Past Surgical History:  Procedure Laterality Date  . CARDIAC  CATHETERIZATION  2010  . CARDIAC CATHETERIZATION N/A 02/03/2015   Procedure: Left Heart Cath and Coronary Angiography;  Surgeon: Wellington [name], MD;  Location: [facility];  Service: Cardiovascular;  Laterality: N/A;  . CATARACT EXTRACTION Bilateral   . CYSTOSCOPY W/ RETROGRADES Bilateral 04/11/2017   Procedure: CYSTOSCOPY WITH RETROGRADE PYELOGRAM;  Surgeon: [name], MD;  Location: [facility];  Service: Urology;  Laterality: Bilateral;  . CYSTOSCOPY W/ RETROGRADES Bilateral 09/12/2017   Procedure: CYSTOSCOPY WITH RETROGRADE PYELOGRAM;  Surgeon: [name], MD;  Location: [facility];  Service: Urology;  Laterality: Bilateral;  . CYSTOSCOPY W/ RETROGRADES Bilateral 02/25/2018   Procedure: CYSTOSCOPY WITH RETROGRADE PYELOGRAM;  Surgeon: [name], MD;  Location: [facility];  Service: Urology;  Laterality: Bilateral;  . CYSTOSCOPY W/ URETERAL STENT PLACEMENT Bilateral 09/12/2017   Procedure: CYSTOSCOPY WITH STENT REPLACEMENT;  Surgeon: [name], MD;  Location: [facility];  Service: Urology;  Laterality: Bilateral;  . CYSTOSCOPY W/ URETERAL STENT REMOVAL Bilateral 02/25/2018   Procedure: CYSTOSCOPY WITH STENT REMOVAL;  Surgeon: [name], MD;  Location: [facility];  Service: Urology;  Laterality: Bilateral;  . CYSTOSCOPY WITH STENT PLACEMENT Left 04/11/2017   Procedure: CYSTOSCOPY WITH STENT PLACEMENT and fulgeration;  Surgeon: [name], MD;  Location: [facility];  Service: Urology;  Laterality: Left;  . CYSTOSCOPY WITH URETHRAL DILATATION Bilateral 04/11/2017   Procedure: CYSTOSCOPY WITH URETHRAL DILATATION;  Surgeon: [name], MD;  Location: [facility];  Service: Urology;  Laterality: Bilateral;  . IR CONVERT RIGHT NEPHROSTOMY TO NEPHROURETERAL  CATH  05/21/2017  . IR NEPHROSTOMY PLACEMENT RIGHT  04/13/2017     Current Outpatient Medications  Medication Sig Dispense Refill  . acetaminophen (TYLENOL) 500 MG tablet Take 500 mg by mouth every 4 (four) hours as needed  for moderate pain or fever.    [name] albuterol (PROVENTIL HFA;VENTOLIN HFA) 108 (90 Base) MCG/ACT inhaler Inhale 2 puffs into the lungs every 4 (four) hours as needed for wheezing or shortness of breath.    [name] atorvastatin (LIPITOR) 10 MG tablet Take 10 mg by mouth daily at 6 PM.     . BD PEN NEEDLE MICRO U/F 32G X 6 MM MISC     . Calcium Carb-Cholecalciferol (CALCIUM 600+D3) 600-800 MG-UNIT TABS Take 1 tablet by mouth every evening.     [name] glimepiride (AMARYL) 2 MG tablet Take 2 mg by mouth daily with breakfast.    . glucose blood (ACCU-CHEK GUIDE) test strip Use asdirected three times a day diag E11.65 100 each 3  . ibuprofen (ADVIL,MOTRIN) 200 MG tablet Take 200 mg by mouth every 6 (six) hours as needed for fever or moderate pain.     [name] insulin glargine (LANTUS) 100 UNIT/ML injection Inject 6 Units into the skin every morning. Based on sugar levels in am.     . iron polysaccharides (NIFEREX) 150 MG capsule Take 150 mg by mouth daily.     . metFORMIN (GLUCOPHAGE) 1000 MG tablet Take 1,000 mg by mouth every evening. With dinner    . metoprolol succinate (TOPROL-XL) 25 MG 24 hr tablet TAKE 1 TABLET BY MOUTH EVERY DAY FOR BLOOD PRESSURE (Patient taking differently: Take 25 mg by mouth every evening. ) 90 tablet 3  . Multiple Vitamins-Minerals (MULTIVITAMIN WITH MINERALS) tablet Take 1 tablet by mouth daily.     [name] MYRBETRIQ 25 MG TB24 tablet TAKE 1 TABLET BY MOUTH EVERY DAY 30 tablet 11  . omeprazole (PRILOSEC) 20 MG capsule TAKE ONE CAPSULE BY MOUTH EVERY DAY (Patient taking differently: Take 20 mg by mouth daily. ) 90 capsule 2  . XTANDI 40 MG capsule TAKE 2 CAPSULES (80 MG TOTAL) BY MOUTH DAILY. 60 capsule 3   No current facility-administered medications for this visit.     Allergies:   Patient has no known allergies.    Social History:  The patient  reports that he has never smoked. He has never used smokeless tobacco. He reports that he does not drink alcohol or use drugs.   Family History:   The patient's family history includes Hypertension in his father.    ROS:  Please see the history of present illness.   Otherwise, review of systems are positive for none.   All other systems are reviewed and negative.    PHYSICAL EXAM: VS:  BP 130/72 (BP Location: Left Arm, Patient Position: Sitting, Cuff Size: Normal)   Pulse 77   Temp (!) 97 F (36.1 C)   Ht 5\' 4"  (1.626 m)   Wt 137 lb 8 oz (62.4 kg)   BMI 23.60 kg/m  , BMI Body mass index is 23.6 kg/m. GEN: Well nourished, well developed, in no acute distress  HEENT: normal  Neck: no JVD, carotid bruits, or masses Cardiac: RRR; no murmurs, rubs, or gallops,no edema  Respiratory:  clear to auscultation bilaterally, normal work of breathing GI: soft, nontender, nondistended, + BS MS: no deformity or atrophy  Skin: warm and dry, no rash Neuro:  Strength and sensation are intact Psych: euthymic mood, full affect Radial pulses normal  bilaterally.  EKG:  EKG is ordered today. The ekg ordered today demonstrates normal sinus rhythm with no significant ST or T wave changes.   Recent Labs: 11/25/2018: ALT 16; BUN 18; Creatinine, Ser 1.04; Hemoglobin 12.2; Platelets 239; Potassium 4.2; Sodium 137    Lipid Panel No results found for: CHOL, TRIG, HDL, CHOLHDL, VLDL, LDLCALC, LDLDIRECT    Wt Readings from Last 3 Encounters:  01/02/19 137 lb 8 oz (62.4 kg)  11/25/18 138 lb (62.6 kg)  10/14/18 136 lb (61.7 kg)       No flowsheet data found.    ASSESSMENT AND PLAN:  1.  Coronary artery disease involving native coronary arteries with worsening exertional dyspnea: This could be angina equivalent.  The patient has not had any recent cardiac evaluation.  Thus, I requested a Lexiscan Myoview as well as an echocardiogram.    2.  Hyperlipidemia: Continue treatment with atorvastatin with a target LDL of less than 70.  3.  Essential hypertension: Blood pressure is controlled on current medications.  4.  Diabetes mellitus: He is  working on better glycemic control.  5.  Metastatic prostate cancer: Responded very well to treatment and currently on hormone therapy.    Disposition:   FU with me in 6 months.  Signed,  [name], MD  01/02/2019 3:50 PM    [facility]

## 2019-01-02 NOTE — Patient Instructions (Signed)
Medication Instructions:  Your physician recommends that you continue on your current medications as directed. Please refer to the Current Medication list given to you today.  *If you need a refill on your cardiac medications before your next appointment, please call your pharmacy*  Lab Work: None ordered If you have labs (blood work) drawn today and your tests are completely normal, you will receive your results only by: [name] MyChart Message (if you have MyChart) OR . A paper copy in the mail If you have any lab test that is abnormal or we need to change your treatment, we will call you to review the results.  Testing/Procedures: Your physician has requested that you have an echocardiogram. Echocardiography is a painless test that uses sound waves to create images of your heart. It provides your doctor with information about the size and shape of your heart and how well your heart's chambers and valves are working. This procedure takes approximately one hour. There are no restrictions for this procedure.  Your physician has requested that you have a lexiscan myoview. For further information please visit [email or web address]. Please follow instruction sheet, as given.    Follow-Up: At [facility], you and your health needs are our priority.  As part of our continuing mission to provide you with exceptional heart care, we have created designated Provider Care Teams.  These Care Teams include your primary Cardiologist (physician) and Advanced Practice Providers (APPs -  Physician Assistants and Nurse Practitioners) who all work together to provide you with the care you need, when you need it.  Your next appointment:   6 months  The format for your next appointment:   In Person  Provider:    You may see Dr. Fletcher Anon or one of the following Advanced Practice Providers on your designated Care Team:    Murray Hodgkins, NP  Christell Faith, PA-C  Marrianne Mood, PA-C   Other  Instructions  [place]  Your caregiver has ordered a Stress Test with nuclear imaging. The purpose of this test is to evaluate the blood supply to your heart muscle. This procedure is referred to as a "Non-Invasive Stress Test." This is because other than having an IV started in your vein, nothing is inserted or "invades" your body. Cardiac stress tests are done to find areas of poor blood flow to the heart by determining the extent of coronary artery disease (CAD). Some patients exercise on a treadmill, which naturally increases the blood flow to your heart, while others who are  unable to walk on a treadmill due to physical limitations have a pharmacologic/chemical stress agent called Lexiscan . This medicine will mimic walking on a treadmill by temporarily increasing your coronary blood flow.   Please note: these test may take anywhere between 2-4 hours to complete  PLEASE REPORT TO [facility] AT THE FIRST DESK WILL DIRECT YOU WHERE TO GO  Date of Procedure:_____________________________________  Arrival Time for Procedure:______________________________  Instructions regarding medication:   _X___ : Hold diabetes medication morning of procedure  _X___:  Hold betablocker(Metoprolol) night before procedure and morning of procedure  ____:  Hold other medications as follows:_________________________________________________________________________________________________________________________________________________________________________________________________________________________________________________________________________________________  PLEASE NOTIFY THE OFFICE AT LEAST 24 HOURS IN ADVANCE IF YOU ARE UNABLE TO KEEP YOUR APPOINTMENT.  [phone] AND  PLEASE NOTIFY NUCLEAR MEDICINE AT [facility] AT LEAST 24 HOURS IN ADVANCE IF YOU ARE UNABLE TO KEEP YOUR APPOINTMENT. [phone]  How to prepare for your Myoview test:  1. Do not eat or  drink  after midnight 2. No caffeine for 24 hours prior to test 3. No smoking 24 hours prior to test. 4. Your medication may be taken with water.  If your doctor stopped a medication because of this test, do not take that medication. 5. Ladies, please do not wear dresses.  Skirts or pants are appropriate. Please wear a short sleeve shirt. 6. No perfume, cologne or lotion. 7. Wear comfortable walking shoes. No heels!

## 2019-01-03 ENCOUNTER — Other Ambulatory Visit: Payer: Self-pay

## 2019-01-03 ENCOUNTER — Encounter: Payer: Self-pay | Admitting: Internal Medicine

## 2019-01-06 ENCOUNTER — Inpatient Hospital Stay (HOSPITAL_BASED_OUTPATIENT_CLINIC_OR_DEPARTMENT_OTHER): Payer: Medicare Other | Admitting: Internal Medicine

## 2019-01-06 ENCOUNTER — Encounter: Payer: Self-pay | Admitting: Internal Medicine

## 2019-01-06 ENCOUNTER — Other Ambulatory Visit: Payer: Self-pay

## 2019-01-06 ENCOUNTER — Inpatient Hospital Stay: Payer: Medicare Other | Attending: Internal Medicine

## 2019-01-06 DIAGNOSIS — Z794 Long term (current) use of insulin: Secondary | ICD-10-CM | POA: Diagnosis not present

## 2019-01-06 DIAGNOSIS — E1165 Type 2 diabetes mellitus with hyperglycemia: Secondary | ICD-10-CM | POA: Diagnosis not present

## 2019-01-06 DIAGNOSIS — I2510 Atherosclerotic heart disease of native coronary artery without angina pectoris: Secondary | ICD-10-CM | POA: Insufficient documentation

## 2019-01-06 DIAGNOSIS — D509 Iron deficiency anemia, unspecified: Secondary | ICD-10-CM | POA: Diagnosis not present

## 2019-01-06 DIAGNOSIS — Z79899 Other long term (current) drug therapy: Secondary | ICD-10-CM | POA: Insufficient documentation

## 2019-01-06 DIAGNOSIS — R531 Weakness: Secondary | ICD-10-CM | POA: Insufficient documentation

## 2019-01-06 DIAGNOSIS — I10 Essential (primary) hypertension: Secondary | ICD-10-CM | POA: Diagnosis not present

## 2019-01-06 DIAGNOSIS — M25512 Pain in left shoulder: Secondary | ICD-10-CM | POA: Insufficient documentation

## 2019-01-06 DIAGNOSIS — E785 Hyperlipidemia, unspecified: Secondary | ICD-10-CM | POA: Insufficient documentation

## 2019-01-06 DIAGNOSIS — K219 Gastro-esophageal reflux disease without esophagitis: Secondary | ICD-10-CM | POA: Insufficient documentation

## 2019-01-06 DIAGNOSIS — C7951 Secondary malignant neoplasm of bone: Secondary | ICD-10-CM | POA: Diagnosis not present

## 2019-01-06 DIAGNOSIS — C61 Malignant neoplasm of prostate: Secondary | ICD-10-CM

## 2019-01-06 DIAGNOSIS — R5383 Other fatigue: Secondary | ICD-10-CM | POA: Diagnosis not present

## 2019-01-06 DIAGNOSIS — R0602 Shortness of breath: Secondary | ICD-10-CM | POA: Insufficient documentation

## 2019-01-06 LAB — PSA: Prostatic Specific Antigen: 0.1 ng/mL (ref 0.00–4.00)

## 2019-01-06 MED FILL — XTANDI 40 MG CAPSULE: 40 | 30 days supply | Qty: 60 | Fill #3

## 2019-01-06 NOTE — Assessment & Plan Note (Addendum)
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 Lupron [104m q 40m; last 09/02/2018; On Xtandi; February 2020-CT scan shows continued significant improvement of the retroperitoneal adenopathy; bladder/rectal invasion.;  Diffuse diffuse osseous metastatic disease-stable.  #Continue Xtandi 80 mg a day [secondary to fatigue]; AUG PSA- stable/ 0.09.  Awaiting PSA from today.   # prostate cancer mets to bone-hold Zometa; poor tolerance because of joint pains bone pain. HOLD off for now.  [last 04/12/2018].    # Anemia-iron deficiency-hemoglobin 12.3-stable  #ESBL in urine-question colonization.  Defer to PCP/ID evaluation.  # Dyspnea on exertion-unlikely from anemia as hemoglobin is 12. Awaiting cardiac work up.   I spoke at length with the patient's family- regarding the patient's clinical status/plan of care.  Family agreement.   # DISPOSITION:  # Follow up in 2 month-MD/labs-cbc/cmp/psa/ Lupron-[[place]/friday early AM appts]Dr.B

## 2019-01-06 NOTE — Progress Notes (Signed)
[place] OFFICE PROGRESS NOTE  Patient Care Team: [name], MD as PCP - General (Internal Medicine)  Cancer Staging No matching staging information was found for the patient.   Oncology History Overview Note  # FEB 2019-Metastatic prostate cancer/ castrate sensitive [bulky retroperitoneal adenopathy; invasion into the bladder; rectum] Bladder Bx- prostate. On Degarelix  # April 3rd 2019- [name] [137md];  Lupron q 6 M [[id number]]; MID AUG 2019- reduced dose to 823mday  # Poorly controlled DM-on insulin  # s/p right PCN [explanted]/ Foley cath [Dr.Brandon]; multiple UTIs.  ----------------------------------------------------------------- DIAGNOSIS: [ FEB 2019] MET. PROSTATE CA  STAGE:   IV ;GOALS: PALLIATIVE  CURRENT/MOST RECENT THERAPY [ Feb-April2019] Lupron+ [name]    [facility]     INTERVAL HISTORY:  GoUZIAH SORTER89.o.  male pleasant patient [name] is here for follow-up.  Patient has not had any recent UTI infections.  Recent UA with PCP showed ESBL E. Coli/question colonization.  Denies any dysuria.  Continues to have mild shortness of breath on exertion.  No chest pain.  Had recent evaluation with cardiology.  Awaiting cardiac work-up  Denies any falls but denies any worsening bone pain.  Left shoulder pain improved.  Review of Systems  Constitutional: Positive for malaise/fatigue. Negative for chills, diaphoresis, fever and weight loss.  HENT: Negative for nosebleeds and sore throat.   Eyes: Negative for double vision.  Respiratory: Positive for shortness of breath. Negative for cough, hemoptysis, sputum production and wheezing.   Cardiovascular: Negative for chest pain, palpitations, orthopnea and leg swelling.  Gastrointestinal: Negative for abdominal pain, blood in stool, constipation, diarrhea, heartburn, melena, nausea and vomiting.   Musculoskeletal: Positive for back pain and joint pain.  Skin: Negative.  Negative for itching and rash.  Neurological: Negative for dizziness, tingling, focal weakness, weakness and headaches.  Endo/Heme/Allergies: Does not bruise/bleed easily.  Psychiatric/Behavioral: Negative for depression. The patient is not nervous/anxious and does not have insomnia.       PAST MEDICAL HISTORY :  Past Medical History:  Diagnosis Date  . Anemia    iron deficiency  . Benign prostatic hypertrophy   . Bilateral carotid artery disease (HCC)    Mild plaque formation without obstructive disease noted on carotid Doppler.  . Coronary artery disease    [facility] The patient was told about 2 blockages which did not require revascularization.  . Diabetes mellitus without complication ([place]  . Essential hypertension   . GERD (gastroesophageal reflux disease)   . Hyperlipidemia   . Hypertension   . Left carotid artery stenosis   . [facility]    PAST SURGICAL HISTORY :   Past Surgical History:  Procedure Laterality Date  . CARDIAC CATHETERIZATION  2010  . CARDIAC CATHETERIZATION N/A 02/03/2015   Procedure: Left Heart Cath and Coronary Angiography;  Surgeon: MuWellington HampshireMD;  [facility];  Service: Cardiovascular;  Laterality: N/A;  . CATARACT EXTRACTION Bilateral   . CYSTOSCOPY W/ RETROGRADES Bilateral 04/11/2017   Procedure: CYSTOSCOPY WITH RETROGRADE PYELOGRAM;  Surgeon: [name];  Location: [facility];  Service: Urology;  Laterality: Bilateral;  . CYSTOSCOPY W/ RETROGRADES Bilateral 09/12/2017   Procedure: CYSTOSCOPY WITH RETROGRADE PYELOGRAM;  Surgeon: [name];  Location: [facility];  Service: Urology;  Laterality: Bilateral;  . CYSTOSCOPY W/ RETROGRADES Bilateral 02/25/2018   Procedure: CYSTOSCOPY WITH RETROGRADE PYELOGRAM;  Surgeon:  [name], MD;  Location: [facility];  Service: Urology;   Laterality: Bilateral;  . CYSTOSCOPY W/ URETERAL STENT PLACEMENT Bilateral 09/12/2017   Procedure: CYSTOSCOPY WITH STENT REPLACEMENT;  Surgeon: [name], MD;  Location: [facility] OR[facility];  Service: Urology;  Laterality: Bilateral;  . CYSTOSCOPY W/ URETERAL STENT REMOVAL Bilateral 02/25/2018   Procedure: CYSTOSCOPY WITH STENT REMOVAL;  Surgeon: [name], MD;  Location: [facility];  Service: Urology;  Laterality: Bilateral;  . CYSTOSCOPY WITH STENT PLACEMENT Left 04/11/2017   Procedure: CYSTOSCOPY WITH STENT PLACEMENT and fulgeration;  Surgeon: [name], MD;  Location: [facility];  Service: Urology;  Laterality: Left;  . CYSTOSCOPY WITH URETHRAL DILATATION Bilateral 04/11/2017   Procedure: CYSTOSCOPY WITH URETHRAL DILATATION;  Surgeon: [name], MD;  Location: [facility];  Service: Urology;  Laterality: Bilateral;  . IR CONVERT RIGHT NEPHROSTOMY TO NEPHROURETERAL CATH  05/21/2017  . IR NEPHROSTOMY PLACEMENT RIGHT  04/13/2017    FAMILY HISTORY :   Family History  Problem Relation Age of Onset  . Hypertension Father     SOCIAL HISTORY:   Social History   Tobacco Use  . Smoking status: Never Smoker  . Smokeless tobacco: Never Used  Substance Use Topics  . Alcohol use: No  . Drug use: No    ALLERGIES:  has No Known Allergies.  MEDICATIONS:  Current Outpatient Medications  Medication Sig Dispense Refill  . atorvastatin (LIPITOR) 10 MG tablet Take 10 mg by mouth daily at 6 PM.     . BD PEN NEEDLE MICRO U/F 32G X 6 MM MISC     . Calcium Carb-Cholecalciferol (CALCIUM 600+D3) 600-800 MG-UNIT TABS Take 1 tablet by mouth every evening.     [name] glimepiride (AMARYL) 2 MG tablet Take 2 mg by mouth daily with breakfast.    . glucose blood (ACCU-CHEK GUIDE) test strip Use asdirected three times a day diag E11.65 100 each 3  . insulin glargine (LANTUS) 100 UNIT/ML injection Inject 6 Units into the skin every morning. Based on sugar levels in am.     . iron polysaccharides (NIFEREX) 150 MG  capsule Take 150 mg by mouth daily.     . metFORMIN (GLUCOPHAGE) 1000 MG tablet Take 1,000 mg by mouth every evening. With dinner    . metoprolol succinate (TOPROL-XL) 25 MG 24 hr tablet TAKE 1 TABLET BY MOUTH EVERY DAY FOR BLOOD PRESSURE (Patient taking differently: Take 25 mg by mouth every evening. ) 90 tablet 3  . Multiple Vitamins-Minerals (MULTIVITAMIN WITH MINERALS) tablet Take 1 tablet by mouth daily.     [name] MYRBETRIQ 25 MG TB24 tablet TAKE 1 TABLET BY MOUTH EVERY DAY 30 tablet 11  . omeprazole (PRILOSEC) 20 MG capsule TAKE ONE CAPSULE BY MOUTH EVERY DAY (Patient taking differently: Take 20 mg by mouth daily. ) 90 capsule 2  . XTANDI 40 MG capsule TAKE 2 CAPSULES (80 MG TOTAL) BY MOUTH DAILY. 60 capsule 3  . acetaminophen (TYLENOL) 500 MG tablet Take 500 mg by mouth every 4 (four) hours as needed for moderate pain or fever.    [name] albuterol (PROVENTIL HFA;VENTOLIN HFA) 108 (90 Base) MCG/ACT inhaler Inhale 2 puffs into the lungs every 4 (four) hours as needed for wheezing or shortness of breath.    [name] ibuprofen (ADVIL,MOTRIN) 200 MG tablet Take 200 mg by mouth every 6 (six) hours as needed for fever or moderate pain.      No current facility-administered medications for this visit.     PHYSICAL EXAMINATION: ECOG PERFORMANCE STATUS:  1 - Symptomatic but completely ambulatory  BP (!) 150/88 (BP Location: Left Arm, Patient Position: Sitting, Cuff Size: Normal)   Pulse 84   Temp (!) 96.3 F (35.7 C) (Oral)   Wt 137 lb 2 oz (62.2 kg)   BMI 23.54 kg/m   Filed Weights   01/06/19 0940  Weight: 137 lb 2 oz (62.2 kg)    Physical Exam  Constitutional: He is oriented to person, place, and time and well-developed, well-nourished, and in no distress.  Accompanied by his daughter.  Walking himself.  HENT:  Head: Normocephalic and atraumatic.  Mouth/Throat: Oropharynx is clear and moist. No oropharyngeal exudate.  Eyes: Pupils are equal, round, and reactive to light.  Neck: Normal range of  motion. Neck supple.  Cardiovascular: Normal rate and regular rhythm.  Pulmonary/Chest: Effort normal and breath sounds normal. No respiratory distress. He has no wheezes.  Abdominal: Soft. Bowel sounds are normal. He exhibits no distension and no mass. There is no abdominal tenderness. There is no rebound and no guarding.  Musculoskeletal: Normal range of motion.        General: No tenderness or edema.  Neurological: He is alert and oriented to person, place, and time.  Skin: Skin is warm.  Psychiatric: Affect normal.     LABORATORY DATA:  I have reviewed the data as listed    Component Value Date/Time   NA 137 11/25/2018 0903   NA 136 05/07/2012 1328   K 4.2 11/25/2018 0903   K 4.5 05/07/2012 1328   CL 102 11/25/2018 0903   CL 104 05/07/2012 1328   CO2 25 11/25/2018 0903   CO2 28 05/07/2012 1328   GLUCOSE 115 (H) 11/25/2018 0903   GLUCOSE 73 05/07/2012 1328   BUN 18 11/25/2018 0903   BUN 16 05/07/2012 1328   CREATININE 1.04 11/25/2018 0903   CREATININE 0.84 05/07/2012 1328   CALCIUM 9.4 11/25/2018 0903   CALCIUM 8.8 05/07/2012 1328   PROT 8.0 11/25/2018 0903   ALBUMIN 4.2 11/25/2018 0903   AST 20 11/25/2018 0903   ALT 16 11/25/2018 0903   ALKPHOS 59 11/25/2018 0903   BILITOT 0.5 11/25/2018 0903   GFRNONAA >60 11/25/2018 0903   GFRNONAA >60 05/07/2012 1328   GFRAA >60 11/25/2018 0903   GFRAA >60 05/07/2012 1328    No results found for: SPEP, UPEP  Lab Results  Component Value Date   WBC 6.8 11/25/2018   NEUTROABS 4.0 11/25/2018   HGB 12.2 (L) 11/25/2018   HCT 36.9 (L) 11/25/2018   MCV 93.9 11/25/2018   PLT 239 11/25/2018      Chemistry      Component Value Date/Time   NA 137 11/25/2018 0903   NA 136 05/07/2012 1328   K 4.2 11/25/2018 0903   K 4.5 05/07/2012 1328   CL 102 11/25/2018 0903   CL 104 05/07/2012 1328   CO2 25 11/25/2018 0903   CO2 28 05/07/2012 1328   BUN 18 11/25/2018 0903   BUN 16 05/07/2012 1328   CREATININE 1.04 11/25/2018 0903    CREATININE 0.84 05/07/2012 1328      Component Value Date/Time   CALCIUM 9.4 11/25/2018 0903   CALCIUM 8.8 05/07/2012 1328   ALKPHOS 59 11/25/2018 0903   AST 20 11/25/2018 0903   ALT 16 11/25/2018 0903   BILITOT 0.5 11/25/2018 0903       RADIOGRAPHIC STUDIES: I have personally reviewed the radiological images as listed and agreed with the findings in the report. No results found.  ASSESSMENT & PLAN:  Prostate cancer [facility]) # Metastatic prostate cancer/ castrate sensitive [bulky retroperitoneal adenopathy; invasion into the bladder; rectum]. Lupron [69mq 673mlast 09/02/2018; On Xtandi; February 2020-CT scan shows continued significant improvement of the retroperitoneal adenopathy; bladder/rectal invasion.;  Diffuse diffuse osseous metastatic disease-stable.  #Continue Xtandi 80 mg a day [secondary to fatigue]; AUG PSA- stable/ 0.09.  Awaiting PSA from today.   # prostate cancer mets to bone-hold Zometa; poor tolerance because of joint pains bone pain. HOLD off for now.  [last 04/12/2018].    # Anemia-iron deficiency-hemoglobin 12.3-stable  #ESBL in urine-question colonization.  Defer to PCP/ID evaluation.  # Dyspnea on exertion-unlikely from anemia as hemoglobin is 12. Awaiting cardiac work up.   I spoke at length with the patient's family- regarding the patient's clinical status/plan of care.  Family agreement.   # DISPOSITION:  # Follow up in 2 month-MD/labs-cbc/cmp/psa/ Lupron-[[place]/friday early AM appts]Dr.B    Orders Placed This Encounter  Procedures  . CBC with Differential    Standing Status:   Future    Standing Expiration Date:   01/06/2020  . Comprehensive metabolic panel    Standing Status:   Future    Standing Expiration Date:   01/06/2020  . PSA    Standing Status:   Future    Standing Expiration Date:   01/06/2020   All questions were answered. The patient knows to call the clinic with any problems, questions or concerns.      GoCammie Sickle MD 01/06/2019 12:55 PM

## 2019-01-07 ENCOUNTER — Telehealth: Payer: Self-pay | Admitting: Infectious Diseases

## 2019-01-07 NOTE — Telephone Encounter (Signed)
Called to make an appt with patient from referral

## 2019-01-14 ENCOUNTER — Other Ambulatory Visit: Payer: Self-pay

## 2019-01-14 ENCOUNTER — Ambulatory Visit: Payer: Medicare Other | Attending: Infectious Diseases | Admitting: Infectious Diseases

## 2019-01-14 ENCOUNTER — Encounter: Payer: Self-pay | Admitting: Infectious Diseases

## 2019-01-14 VITALS — BP 148/84 | HR 84 | Temp 98.0°F | Resp 16 | Ht 64.0 in | Wt 137.0 lb

## 2019-01-14 DIAGNOSIS — Z87440 Personal history of urinary (tract) infections: Secondary | ICD-10-CM

## 2019-01-14 DIAGNOSIS — E119 Type 2 diabetes mellitus without complications: Secondary | ICD-10-CM

## 2019-01-14 DIAGNOSIS — Z2239 Carrier of other specified bacterial diseases: Secondary | ICD-10-CM

## 2019-01-14 DIAGNOSIS — C61 Malignant neoplasm of prostate: Secondary | ICD-10-CM

## 2019-01-14 DIAGNOSIS — Z79899 Other long term (current) drug therapy: Secondary | ICD-10-CM

## 2019-01-14 DIAGNOSIS — C7951 Secondary malignant neoplasm of bone: Secondary | ICD-10-CM

## 2019-01-14 DIAGNOSIS — I10 Essential (primary) hypertension: Secondary | ICD-10-CM

## 2019-01-14 DIAGNOSIS — Z7984 Long term (current) use of oral hypoglycemic drugs: Secondary | ICD-10-CM

## 2019-01-14 DIAGNOSIS — Z22358 Carrier of other enterobacterales: Secondary | ICD-10-CM

## 2019-01-14 DIAGNOSIS — I2510 Atherosclerotic heart disease of native coronary artery without angina pectoris: Secondary | ICD-10-CM

## 2019-01-14 NOTE — Patient Instructions (Signed)
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 Please ask your doctors not to check urine on routine basis.you can try cranberry  Will see you anytime if needed

## 2019-01-14 NOTE — Progress Notes (Signed)
NAME: Michael [name]  DOB: August 08, 1933  MRN: [id number]  Date/Time: 01/14/2019 10:47 AM  REQUESTING PROVIDER: Dr.Hande Subjective:  REASON FOR CONSULT: ESBL e.coli in urine culture ? Michael [name] SCORE is a 85 y.o. with a history of metastatic prostate cancer on Xtandi and Lupron, coronary artery disease, diabetes mellitus, essential hypertension is here with his daughter for ESBL E. coli in the urine.  He was referred by his PCP. Patient has complicated urological history.  In April 11, 2017 he was in the hospital with acute kidney injury due to urinary retention, bilateral hydronephrosis, extensive bulky retroperitoneal adenopathy invasion into the bladder and rectum with metastasis.  He underwent cystoscopy and had left ureteral stent placement as well as bladder neck contracture dilatation.  He had a right nephrostomy placed.  He was diagnosed with advanced prostate cancer.  He had a Foley placed.  Since then he has been under the care of oncology and urology.  PET scan had revealed extensive bone mets as well.  On 05/21/2017 the right nephrostomy was changed into her right ureteral stent by IR.  On 09/12/2017 he had bilateral ureteral stents exchanged by Dr. Erlene Quan..  On 02/25/2018 because both his ureteral stents were removed. Patient is currently without any stents or Foley catheter. Patient has had chronic issue with passing urine freely.  He goes to the bathroom every 2-3 hours.  He has not had any pain in his flank.  As per his daughter he has not had any antibiotics in the last 2 to 3 months.  In the beginning in 2019  he was treated with multiple courses of antibiotics for urinary tract infections. His PCP referred him to me because of the persistent ESBL in the urine. Patient was given a course of fosfomycin on 12/27/2018 but as it was  $150 out of pocket he did not take it.  He does not have any fever.  There is no hematuria.  There is no dysuria.  Since 04/05/2017 every single urine  culture is positive for some bacteria The last was 12/30/2018 and that is ESBL E. coli which is susceptible to Zosyn, Augmentin, nitrofurantoin.  Patient has had the same symptoms for the past many months and nothing has gotten worse recently.  His urine gets checked routinely when he goes to his PCPs office. He currently gets Lupron every 6 months and on Xtandi Past Medical History:  Diagnosis Date  . Anemia    iron deficiency  . Benign prostatic hypertrophy   . Bilateral carotid artery disease (HCC)    Mild plaque formation without obstructive disease noted on carotid Doppler.  . Coronary artery disease    Previous cardiac catheterization at [facility] in 2010. The patient was told about 2 blockages which did not require revascularization.  . Diabetes mellitus without complication ([place])   . Essential hypertension   . GERD (gastroesophageal reflux disease)   . Hyperlipidemia   . Hypertension   . Left carotid artery stenosis   . Prostate cancer ([place]) 03/2017    Past Surgical History:  Procedure Laterality Date  . CARDIAC CATHETERIZATION  2010  . CARDIAC CATHETERIZATION N/A 02/03/2015   Procedure: Left Heart Cath and Coronary Angiography;  Surgeon: Wellington [name], MD;  Location: [facility];  Service: Cardiovascular;  Laterality: N/A;  . CATARACT EXTRACTION Bilateral   . CYSTOSCOPY W/ RETROGRADES Bilateral 04/11/2017   Procedure: CYSTOSCOPY WITH RETROGRADE PYELOGRAM;  Surgeon: [name], MD;  Location: [facility];  Service: Urology;  Laterality: Bilateral;  .  CYSTOSCOPY W/ RETROGRADES Bilateral 09/12/2017   Procedure: CYSTOSCOPY WITH RETROGRADE PYELOGRAM;  Surgeon: [name], MD;  Location: [facility];  Service: Urology;  Laterality: Bilateral;  . CYSTOSCOPY W/ RETROGRADES Bilateral 02/25/2018   Procedure: CYSTOSCOPY WITH RETROGRADE PYELOGRAM;  Surgeon: [name], MD;  Location: [facility];  Service: Urology;  Laterality: Bilateral;  . CYSTOSCOPY W/ URETERAL STENT PLACEMENT  Bilateral 09/12/2017   Procedure: CYSTOSCOPY WITH STENT REPLACEMENT;  Surgeon: [name], MD;  Location: [facility];  Service: Urology;  Laterality: Bilateral;  . CYSTOSCOPY W/ URETERAL STENT REMOVAL Bilateral 02/25/2018   Procedure: CYSTOSCOPY WITH STENT REMOVAL;  Surgeon: [name], MD;  Location: [facility];  Service: Urology;  Laterality: Bilateral;  . CYSTOSCOPY WITH STENT PLACEMENT Left 04/11/2017   Procedure: CYSTOSCOPY WITH STENT PLACEMENT and fulgeration;  Surgeon: [name], MD;  Location: [facility];  Service: Urology;  Laterality: Left;  . CYSTOSCOPY WITH URETHRAL DILATATION Bilateral 04/11/2017   Procedure: CYSTOSCOPY WITH URETHRAL DILATATION;  Surgeon: [name], MD;  Location: [facility];  Service: Urology;  Laterality: Bilateral;  . IR CONVERT RIGHT NEPHROSTOMY TO NEPHROURETERAL CATH  05/21/2017  . IR NEPHROSTOMY PLACEMENT RIGHT  04/13/2017    Social History   Socioeconomic History  . Marital status: Widowed    Spouse name: Not on file  . Number of children: Not on file  . Years of education: Not on file  . Highest education level: Not on file  Occupational History    Comment: retired  Scientific laboratory technician  . Financial resource strain: Not hard at all  . Food insecurity    Worry: Never true    Inability: Never true  . Transportation needs    Medical: No    Non-medical: No  Tobacco Use  . Smoking status: Never Smoker  . Smokeless tobacco: Never Used  Substance and Sexual Activity  . Alcohol use: No  . Drug use: No  . Sexual activity: Not Currently  Lifestyle  . Physical activity    Days per week: 0 days    Minutes per session: 0 min  . Stress: Only a little  Relationships  . Social Herbalist on phone: Twice a week    Gets together: Twice a week    Attends religious service: Never    Active member of club or organization: No    Attends meetings of clubs or organizations: Never    Relationship status: Widowed  . Intimate partner violence     Fear of current or ex partner: No    Emotionally abused: No    Physically abused: No    Forced sexual activity: No  Other Topics Concern  . Not on file  Social History Narrative  . Not on file    Family History  Problem Relation Age of Onset  . Hypertension Father    No Known Allergies  ? Current Outpatient Medications  Medication Sig Dispense Refill  . acetaminophen (TYLENOL) 500 MG tablet Take 500 mg by mouth every 4 (four) hours as needed for moderate pain or fever.    [name] albuterol (PROVENTIL HFA;VENTOLIN HFA) 108 (90 Base) MCG/ACT inhaler Inhale 2 puffs into the lungs every 4 (four) hours as needed for wheezing or shortness of breath.    [name] atorvastatin (LIPITOR) 10 MG tablet Take 10 mg by mouth daily at 6 PM.     . BD PEN NEEDLE MICRO U/F 32G X 6 MM MISC     . Calcium Carb-Cholecalciferol (CALCIUM 600+D3) 600-800 MG-UNIT TABS Take  1 tablet by mouth every evening.     [name] glimepiride (AMARYL) 2 MG tablet Take 2 mg by mouth daily with breakfast.    . glucose blood (ACCU-CHEK GUIDE) test strip Use asdirected three times a day diag E11.65 100 each 3  . ibuprofen (ADVIL,MOTRIN) 200 MG tablet Take 200 mg by mouth every 6 (six) hours as needed for fever or moderate pain.     [name] insulin glargine (LANTUS) 100 UNIT/ML injection Inject 6 Units into the skin every morning. Based on sugar levels in am.     . iron polysaccharides (NIFEREX) 150 MG capsule Take 150 mg by mouth daily.     . metFORMIN (GLUCOPHAGE) 1000 MG tablet Take 1,000 mg by mouth every evening. With dinner    . metoprolol succinate (TOPROL-XL) 25 MG 24 hr tablet TAKE 1 TABLET BY MOUTH EVERY DAY FOR BLOOD PRESSURE (Patient taking differently: Take 25 mg by mouth every evening. ) 90 tablet 3  . Multiple Vitamins-Minerals (MULTIVITAMIN WITH MINERALS) tablet Take 1 tablet by mouth daily.     [name] MYRBETRIQ 25 MG TB24 tablet TAKE 1 TABLET BY MOUTH EVERY DAY 30 tablet 11  . omeprazole (PRILOSEC) 20 MG capsule TAKE ONE CAPSULE BY MOUTH  EVERY DAY (Patient taking differently: Take 20 mg by mouth daily. ) 90 capsule 2  . XTANDI 40 MG capsule TAKE 2 CAPSULES (80 MG TOTAL) BY MOUTH DAILY. 60 capsule 3   No current facility-administered medications for this visit.      Abtx:  Anti-infectives (From admission, onward)   None      REVIEW OF SYSTEMS:  Const: negative fever, negative chills, negative weight loss Eyes: negative diplopia or visual changes, negative eye pain ENT: negative coryza, negative sore throat Resp: negative cough, hemoptysis, dyspnea Cards: negative for chest pain, palpitations, lower extremity edema GU: As above GI: Negative for abdominal pain, diarrhea, bleeding, constipation Skin: negative for rash and pruritus Heme: negative for easy bruising and gum/nose bleeding MS: negative for myalgias, arthralgias, back pain and muscle weakness Neurolo:negative for headaches, dizziness, vertigo, memory problems  Psych: negative for feelings of anxiety, depression  Endocrine: negative for thyroid, diabetes Allergy/Immunology- negative for any medication or food allergies ?  Objective:  VITALS:  BP (!) 148/84   Pulse 84   Temp 98 F (36.7 C) (Temporal)   Resp 16   Ht 5\' 4"  (1.626 m)   Wt 137 lb (62.1 kg)   SpO2 98%   BMI 23.52 kg/m  PHYSICAL EXAM:  General: Alert, cooperative, no distress, appears stated age.  Head: Normocephalic, without obvious abnormality, atraumatic. Eyes: Conjunctivae clear, anicteric sclerae. Pupils are equal ENT Nares normal. No drainage or sinus tenderness. Lips, mucosa, and tongue normal. No Thrush Neck: Supple, symmetrical, no adenopathy, thyroid: non tender no carotid bruit and no JVD. Back: No CVA tenderness. Lungs: Clear to auscultation bilaterally. No Wheezing or Rhonchi. No rales. Heart: Regular rate and rhythm, no murmur, rub or gallop. Abdomen: Soft, non-tender,not distended. Bowel sounds normal. No masses Extremities: atraumatic, no cyanosis. No edema. No  clubbing Skin: No rashes or lesions. Or bruising Lymph: Cervical, supraclavicular normal. Neurologic: Grossly non-focal Pertinent Labs Lab Results CBC    Component Value Date/Time   WBC 6.8 11/25/2018 0903   RBC 3.93 (L) 11/25/2018 0903   HGB 12.2 (L) 11/25/2018 0903   HGB 13.7 05/07/2012 1328   HCT 36.9 (L) 11/25/2018 0903   PLT 239 11/25/2018 0903   MCV 93.9 11/25/2018 0903   MCH 31.0 11/25/2018  0903   MCHC 33.1 11/25/2018 0903   RDW 12.3 11/25/2018 0903   LYMPHSABS 1.9 11/25/2018 0903   MONOABS 0.8 11/25/2018 0903   EOSABS 0.1 11/25/2018 0903   BASOSABS 0.0 11/25/2018 0903    CMP Latest Ref Rng & Units 11/25/2018 10/14/2018 09/02/2018  Glucose 70 - 99 mg/dL 115(H) 162(H) 132(H)  BUN 8 - 23 mg/dL 18 19 22   Creatinine 0.61 - 1.24 mg/dL 1.04 1.04 0.96  Sodium 135 - 145 mmol/L 137 136 135  Potassium 3.5 - 5.1 mmol/L 4.2 4.6 4.1  Chloride 98 - 111 mmol/L 102 102 106  CO2 22 - 32 mmol/L 25 24 23   Calcium 8.9 - 10.3 mg/dL 9.4 9.4 8.9  Total Protein 6.5 - 8.1 g/dL 8.0 7.9 7.8  Total Bilirubin 0.3 - 1.2 mg/dL 0.5 0.5 0.5  Alkaline Phos 38 - 126 U/L 59 55 49  AST 15 - 41 U/L 20 20 21   ALT 0 - 44 U/L 16 14 15      IMAGING RESULTS: Last CT scan from March 29, 2018 shows widespread diffuse osseous metastasis. Overall improvement of retroperitoneal nodes. Mild fullness of the bilateral renal collecting system. Thick-walled bladder.  I have personally reviewed the films ? Impression/Recommendation ? ?85 year old male with history of metastatic prostate cancer with bone mets and bulky pelvic and retroperitoneal lymph nodes with bilateral hydronephrosis all diagnosed February 2019 needing right nephrostomy, bilateral ureteral stents, and Foley catheter.  Now all the hardware has been removed.  He  got chemotherapy and now is on hormonal therapy with Xtandi and Lupron  ESBL E. coli in the urine.  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   He has been treated multiple times in  the past.  Recommend not checking his urine on a routine basis Recommend antibiotics only  if he has fever or flank pain or if he has to undergo cystoscopy. Patient is currently asymptomatic other than his baseline urinary symptoms which includes frequent urination with some element of incomplete emptying.  As he does not have any fever or hematuria or dysuria will hold off on antibiotics for now.  I have told the daughter and the patient note for signs of systemic infection including flank pain, fever, pain abdomen or with worsening of his baseline symptoms and then he can be on antibiotics.  Diabetes mellitus on Lantus and glimepiride and Metformin.  Coronary artery disease on atorvastatin, metoprolol ___________________________________________________ Discussed with patient, and his daughter in great detail Note:  This document was prepared using Dragon voice recognition software and may include unintentional dictation errors.

## 2019-01-17 ENCOUNTER — Ambulatory Visit
Admission: RE | Admit: 2019-01-17 | Discharge: 2019-01-17 | Disposition: A | Discharge status: Home / Self Care | Payer: Medicare Other | Source: Ambulatory Visit | Attending: Cardiovascular Disease | Admitting: Cardiovascular Disease

## 2019-01-17 ENCOUNTER — Other Ambulatory Visit: Payer: Self-pay

## 2019-01-17 DIAGNOSIS — R0602 Shortness of breath: Secondary | ICD-10-CM

## 2019-01-17 LAB — NM MYOCAR MULTI W/SPECT W/WALL MOTION / EF
LV dias vol: 15 mL (ref 62–150)
LV sys vol: 4 mL
Peak HR: 109 {beats}/min
Percent HR: 79 %
Rest HR: 90 {beats}/min
SDS: 0
SRS: 1
SSS: 0
TID: 1

## 2019-01-17 MED ORDER — TECHNETIUM TC 99M TETROFOSMIN IV KIT
32.7200 | PACK | Freq: Once | INTRAVENOUS | Status: AC | PRN
  Administered 2019-01-17: 32.72 via INTRAVENOUS

## 2019-01-17 MED ORDER — TECHNETIUM TC 99M TETROFOSMIN IV KIT
10.0000 | PACK | Freq: Once | INTRAVENOUS | Status: AC | PRN
  Administered 2019-01-17: 10.85 via INTRAVENOUS

## 2019-01-17 MED ORDER — REGADENOSON 0.4 MG/5ML IV SOLN
0.4000 mg | Freq: Once | INTRAVENOUS | Status: AC
  Administered 2019-01-17: 0.4 mg via INTRAVENOUS

## 2019-01-20 ENCOUNTER — Telehealth: Payer: Self-pay

## 2019-01-20 NOTE — Telephone Encounter (Signed)
DPR on file. Patient's daughter [name] made aware of the patient's stress test results. Hina verbalized understanding.

## 2019-01-20 NOTE — Telephone Encounter (Signed)
-----   Message from Wellington [name], MD sent at 01/20/2019  2:45 PM EST ----- Inform patient that  stress test was normal.

## 2019-01-24 ENCOUNTER — Other Ambulatory Visit: Payer: Self-pay | Admitting: Internal Medicine

## 2019-01-30 ENCOUNTER — Other Ambulatory Visit: Payer: Self-pay | Admitting: Internal Medicine

## 2019-01-30 DIAGNOSIS — C61 Malignant neoplasm of prostate: Secondary | ICD-10-CM

## 2019-02-03 ENCOUNTER — Other Ambulatory Visit: Payer: Self-pay

## 2019-02-03 ENCOUNTER — Ambulatory Visit (INDEPENDENT_AMBULATORY_CARE_PROVIDER_SITE_OTHER): Payer: Medicare Other

## 2019-02-03 DIAGNOSIS — R0602 Shortness of breath: Secondary | ICD-10-CM

## 2019-02-05 MED FILL — XTANDI 40 MG CAPSULE: 40 | 30 days supply | Qty: 60 | Fill #0

## 2019-02-06 ENCOUNTER — Telehealth: Payer: Self-pay

## 2019-02-06 NOTE — Telephone Encounter (Signed)
DPR on file. lmom with results for the patients daughter Hina. Results to mychart with MD comments.

## 2019-02-06 NOTE — Telephone Encounter (Signed)
-----   Message from Wellington [name], MD sent at 02/06/2019 10:44 AM EST ----- Inform patient that echo was fine.  Normal ejection fraction with no significant valvular abnormalities.

## 2019-02-18 ENCOUNTER — Other Ambulatory Visit: Payer: Self-pay | Admitting: Internal Medicine

## 2019-03-06 ENCOUNTER — Other Ambulatory Visit: Payer: Self-pay | Admitting: Internal Medicine

## 2019-03-07 ENCOUNTER — Other Ambulatory Visit: Payer: Self-pay

## 2019-03-10 ENCOUNTER — Other Ambulatory Visit: Payer: Self-pay

## 2019-03-10 ENCOUNTER — Inpatient Hospital Stay: Payer: Medicare Other | Attending: Internal Medicine

## 2019-03-10 ENCOUNTER — Inpatient Hospital Stay: Payer: Medicare Other

## 2019-03-10 ENCOUNTER — Inpatient Hospital Stay (HOSPITAL_BASED_OUTPATIENT_CLINIC_OR_DEPARTMENT_OTHER): Payer: Medicare Other | Admitting: Internal Medicine

## 2019-03-10 DIAGNOSIS — C61 Malignant neoplasm of prostate: Secondary | ICD-10-CM | POA: Insufficient documentation

## 2019-03-10 DIAGNOSIS — I2510 Atherosclerotic heart disease of native coronary artery without angina pectoris: Secondary | ICD-10-CM | POA: Insufficient documentation

## 2019-03-10 DIAGNOSIS — C7951 Secondary malignant neoplasm of bone: Secondary | ICD-10-CM | POA: Diagnosis not present

## 2019-03-10 DIAGNOSIS — M25519 Pain in unspecified shoulder: Secondary | ICD-10-CM | POA: Insufficient documentation

## 2019-03-10 DIAGNOSIS — Z79818 Long term (current) use of other agents affecting estrogen receptors and estrogen levels: Secondary | ICD-10-CM | POA: Diagnosis not present

## 2019-03-10 DIAGNOSIS — M549 Dorsalgia, unspecified: Secondary | ICD-10-CM | POA: Diagnosis not present

## 2019-03-10 DIAGNOSIS — R5383 Other fatigue: Secondary | ICD-10-CM | POA: Diagnosis not present

## 2019-03-10 DIAGNOSIS — E1165 Type 2 diabetes mellitus with hyperglycemia: Secondary | ICD-10-CM | POA: Insufficient documentation

## 2019-03-10 DIAGNOSIS — E119 Type 2 diabetes mellitus without complications: Secondary | ICD-10-CM | POA: Diagnosis not present

## 2019-03-10 DIAGNOSIS — I10 Essential (primary) hypertension: Secondary | ICD-10-CM | POA: Insufficient documentation

## 2019-03-10 DIAGNOSIS — Z794 Long term (current) use of insulin: Secondary | ICD-10-CM | POA: Insufficient documentation

## 2019-03-10 DIAGNOSIS — Z79899 Other long term (current) drug therapy: Secondary | ICD-10-CM | POA: Insufficient documentation

## 2019-03-10 DIAGNOSIS — R0602 Shortness of breath: Secondary | ICD-10-CM | POA: Diagnosis not present

## 2019-03-10 DIAGNOSIS — D509 Iron deficiency anemia, unspecified: Secondary | ICD-10-CM | POA: Diagnosis not present

## 2019-03-10 DIAGNOSIS — K219 Gastro-esophageal reflux disease without esophagitis: Secondary | ICD-10-CM | POA: Diagnosis not present

## 2019-03-10 DIAGNOSIS — E785 Hyperlipidemia, unspecified: Secondary | ICD-10-CM | POA: Diagnosis not present

## 2019-03-10 DIAGNOSIS — Z191 Hormone sensitive malignancy status: Secondary | ICD-10-CM | POA: Diagnosis not present

## 2019-03-10 LAB — CBC WITH DIFFERENTIAL/PLATELET
Abs Immature Granulocytes: 0.03 10*3/uL (ref 0.00–0.07)
Basophils Absolute: 0 10*3/uL (ref 0.0–0.1)
Basophils Relative: 1 %
Eosinophils Absolute: 0.1 10*3/uL (ref 0.0–0.5)
Eosinophils Relative: 1 %
HCT: 37.8 % — ABNORMAL LOW (ref 39.0–52.0)
Hemoglobin: 12.1 g/dL — ABNORMAL LOW (ref 13.0–17.0)
Immature Granulocytes: 1 %
Lymphocytes Relative: 23 %
Lymphs Abs: 1.3 10*3/uL (ref 0.7–4.0)
MCH: 30.6 pg (ref 26.0–34.0)
MCHC: 32 g/dL (ref 30.0–36.0)
MCV: 95.5 fL (ref 80.0–100.0)
Monocytes Absolute: 0.7 10*3/uL (ref 0.1–1.0)
Monocytes Relative: 13 %
Neutro Abs: 3.5 10*3/uL (ref 1.7–7.7)
Neutrophils Relative %: 61 %
Platelets: 243 10*3/uL (ref 150–400)
RBC: 3.96 MIL/uL — ABNORMAL LOW (ref 4.22–5.81)
RDW: 12 % (ref 11.5–15.5)
WBC: 5.7 10*3/uL (ref 4.0–10.5)
nRBC: 0 % (ref 0.0–0.2)

## 2019-03-10 LAB — COMPREHENSIVE METABOLIC PANEL
ALT: 15 U/L (ref 0–44)
AST: 21 U/L (ref 15–41)
Albumin: 4.1 g/dL (ref 3.5–5.0)
Alkaline Phosphatase: 68 U/L (ref 38–126)
Anion gap: 8 (ref 5–15)
BUN: 16 mg/dL (ref 8–23)
CO2: 25 mmol/L (ref 22–32)
Calcium: 9.2 mg/dL (ref 8.9–10.3)
Chloride: 100 mmol/L (ref 98–111)
Creatinine, Ser: 0.99 mg/dL (ref 0.61–1.24)
GFR calc Af Amer: >60 mL/min (ref >60)
GFR calc non Af Amer: >60 mL/min (ref >60)
Glucose, Bld: 216 mg/dL — ABNORMAL HIGH (ref 70–99)
Potassium: 4.1 mmol/L (ref 3.5–5.1)
Sodium: 133 mmol/L — ABNORMAL LOW (ref 135–145)
Total Bilirubin: 0.5 mg/dL (ref 0.3–1.2)
Total Protein: 8.1 g/dL (ref 6.5–8.1)

## 2019-03-10 LAB — PSA: Prostatic Specific Antigen: 0.08 ng/mL (ref 0.00–4.00)

## 2019-03-10 MED ORDER — LEUPROLIDE ACETATE (6 MONTH) 45 MG ~~LOC~~ KIT
45.0000 mg | PACK | Freq: Once | SUBCUTANEOUS | Status: AC
  Administered 2019-03-10: 45 mg via SUBCUTANEOUS
  Filled 2019-03-10: qty 45

## 2019-03-10 NOTE — Assessment & Plan Note (Addendum)
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 Eligard [43m q 90m; last 03/10/2019; On Xtandi; February 2020-CT scan shows continued significant improvement of the retroperitoneal adenopathy; bladder/rectal invasion.;  Diffuse diffuse osseous metastatic disease-stable.  #Continue Xtandi 80 mg a day [secondary to fatigue]; NOV 2021--STABLE/  0.1.  PSA today/January 2021-0.08-overall stable.  Hold off imaging at this time.  # Anemia-iron deficiency-hemoglobin 12.1-STABLE.   #ESBL in urine-question colonization. S/p  PCP/ID evaluation.  Stable.  #Poorly controlled blood sugars-question of the life versus others.  Defer to endocrinology.  Potential cause of patient's fatigue/shortness of breath on exertion.  Blood glucose level 228 today.  # Dyspnea on exertion-unlikely from anemia as hemoglobin is 12.s/p cardiac stress test- Normal.   # I discussed regarding Covid-19 precautions.  I reviewed the vaccine effectiveness and potential side effects in detail.  Also discussed long-term effectiveness and safety profile are unclear at this time.  I discussed December, 2020 [employer] position statement-that all patients are recommended COVID-19 vaccinations [when available]-as long as they do not have allergy to components of the vaccine.  However, I think the benefits of the vaccination outweigh the potential risks. Re: [id number] vaccination. Awaiting [facility] appt.   # DISPOSITION:  # Eligard today # Follow up in 2 month-MD/labs-cbc/cmp/psa/ [[place]/friday early AM appts]Dr.B

## 2019-03-10 NOTE — Progress Notes (Signed)
[place] OFFICE PROGRESS NOTE  Patient Care Team: [name], MD as PCP - General (Internal Medicine)  Cancer Staging No matching staging information was found for the patient.   Oncology History Overview Note  # FEB 2019-Metastatic prostate cancer/ castrate sensitive [bulky retroperitoneal adenopathy; invasion into the bladder; rectum] Bladder Bx- prostate. On Degarelix  # April 3rd 2019- [name] [132md];  Lupron q 6 M [[id number]]; MID AUG 2019- reduced dose to 845mday  # Poorly controlled DM-on insulin  # s/p right PCN [explanted]/ Foley cath [Dr.Brandon]; multiple UTIs.  ----------------------------------------------------------------- DIAGNOSIS: [ FEB 2019] MET. PROSTATE CA  STAGE:   IV ;GOALS: PALLIATIVE  CURRENT/MOST RECENT THERAPY [ Feb-April2019] Lupron+ [name]    [facility]     INTERVAL HISTORY:  GoDARYON REMMERT89.o.  male pleasant patient [name] is here for follow-up.  Patient in the interim had a cardiac stress test that was normal as per family.   Patient continues to have mild shortness of breath on exertion.  No chest pain.  Continues to have shoulder pain back pain not any worse.  No falls.  No recent UTIs.    Review of Systems  Constitutional: Positive for malaise/fatigue. Negative for chills, diaphoresis, fever and weight loss.  HENT: Negative for nosebleeds and sore throat.   Eyes: Negative for double vision.  Respiratory: Positive for shortness of breath. Negative for cough, hemoptysis, sputum production and wheezing.   Cardiovascular: Negative for chest pain, palpitations, orthopnea and leg swelling.  Gastrointestinal: Negative for abdominal pain, blood in stool, constipation, diarrhea, heartburn, melena, nausea and vomiting.  Musculoskeletal: Positive for back pain and joint pain.  Skin: Negative.  Negative for itching and  rash.  Neurological: Negative for dizziness, tingling, focal weakness, weakness and headaches.  Endo/Heme/Allergies: Does not bruise/bleed easily.  Psychiatric/Behavioral: Negative for depression. The patient is not nervous/anxious and does not have insomnia.       PAST MEDICAL HISTORY :  Past Medical History:  Diagnosis Date  . Anemia    iron deficiency  . Benign prostatic hypertrophy   . Bilateral carotid artery disease (HCC)    Mild plaque formation without obstructive disease noted on carotid Doppler.  . Coronary artery disease    [facility]. The patient was told about 2 blockages which did not require revascularization.  . [place]  . Essential hypertension   . GERD (gastroesophageal reflux disease)   . Hyperlipidemia   . Hypertension   . Left carotid artery stenosis   . [place] [facility] [place]    PAST SURGICAL HISTORY :   Past Surgical History:  Procedure Laterality Date  . CARDIAC CATHETERIZATION  2010  . CARDIAC CATHETERIZATION N/A 02/03/2015   Procedure: Left Heart Cath and Coronary Angiography;  Surgeon: MuWellington HampshireMD;  [place] [facility];  Service: Cardiovascular;  Laterality: N/A;  . CATARACT EXTRACTION Bilateral   . CYSTOSCOPY W/ RETROGRADES Bilateral 04/11/2017   Procedure: CYSTOSCOPY WITH RETROGRADE PYELOGRAM;  Surgeon: [name];  Location: [facility];  Service: Urology;  Laterality: Bilateral;  . CYSTOSCOPY W/ RETROGRADES Bilateral 09/12/2017   Procedure: CYSTOSCOPY WITH RETROGRADE PYELOGRAM;  Surgeon: [name];  Location: [facility] OR[facility];  Service: Urology;  Laterality: Bilateral;  . CYSTOSCOPY W/ RETROGRADES Bilateral 02/25/2018   Procedure: CYSTOSCOPY WITH RETROGRADE PYELOGRAM;  Surgeon: [name];  Location: [facility];  Service: Urology;  Laterality: Bilateral;  . CYSTOSCOPY W/ URETERAL STENT PLACEMENT Bilateral 09/12/2017   Procedure:  CYSTOSCOPY WITH STENT REPLACEMENT;  Surgeon: [name], MD;  Location: [facility] ORS;  Service: Urology;  Laterality: Bilateral;  . CYSTOSCOPY W/ URETERAL STENT REMOVAL Bilateral 02/25/2018   Procedure: CYSTOSCOPY WITH STENT REMOVAL;  Surgeon: [name], MD;  Location: [facility];  Service: Urology;  Laterality: Bilateral;  . CYSTOSCOPY WITH STENT PLACEMENT Left 04/11/2017   Procedure: CYSTOSCOPY WITH STENT PLACEMENT and fulgeration;  Surgeon: [name], MD;  Location: [facility];  Service: Urology;  Laterality: Left;  . CYSTOSCOPY WITH URETHRAL DILATATION Bilateral 04/11/2017   Procedure: CYSTOSCOPY WITH URETHRAL DILATATION;  Surgeon: [name], MD;  Location: [facility];  Service: Urology;  Laterality: Bilateral;  . IR CONVERT RIGHT NEPHROSTOMY TO NEPHROURETERAL CATH  05/21/2017  . IR NEPHROSTOMY PLACEMENT RIGHT  04/13/2017    FAMILY HISTORY :   Family History  Problem Relation Age of Onset  . Hypertension Father     SOCIAL HISTORY:   Social History   Tobacco Use  . Smoking status: Never Smoker  . Smokeless tobacco: Never Used  Substance Use Topics  . Alcohol use: No  . Drug use: No    ALLERGIES:  has No Known Allergies.  MEDICATIONS:  Current Outpatient Medications  Medication Sig Dispense Refill  . acetaminophen (TYLENOL) 500 MG tablet Take 500 mg by mouth every 4 (four) hours as needed for moderate pain or fever.    [name] albuterol (PROVENTIL HFA;VENTOLIN HFA) 108 (90 Base) MCG/ACT inhaler Inhale 2 puffs into the lungs every 4 (four) hours as needed for wheezing or shortness of breath.    [name] atorvastatin (LIPITOR) 10 MG tablet Take 10 mg by mouth daily at 6 PM.     . BD PEN NEEDLE MICRO U/F 32G X 6 MM MISC     . Calcium Carb-Cholecalciferol (CALCIUM 600+D3) 600-800 MG-UNIT TABS Take 1 tablet by mouth every evening.     [name] glimepiride (AMARYL) 2 MG tablet Take 2 mg by mouth daily with breakfast.    . glucose blood (ACCU-CHEK GUIDE) test strip Use asdirected three times a  day diag E11.65 100 each 3  . ibuprofen (ADVIL,MOTRIN) 200 MG tablet Take 200 mg by mouth every 6 (six) hours as needed for fever or moderate pain.     [name] insulin glargine (LANTUS) 100 UNIT/ML injection Inject 6 Units into the skin every morning. Based on sugar levels in am.     . iron polysaccharides (NIFEREX) 150 MG capsule Take 150 mg by mouth daily.     . metFORMIN (GLUCOPHAGE) 1000 MG tablet Take 1,000 mg by mouth 2 (two) times daily with a meal. With dinner/ temporarily taking this amount per daughter    . metoprolol succinate (TOPROL-XL) 25 MG 24 hr tablet TAKE 1 TABLET BY MOUTH EVERY DAY FOR BLOOD PRESSURE (Patient taking differently: Take 25 mg by mouth every evening. ) 90 tablet 3  . Multiple Vitamins-Minerals (MULTIVITAMIN WITH MINERALS) tablet Take 1 tablet by mouth daily.     [name] MYRBETRIQ 25 MG TB24 tablet TAKE 1 TABLET BY MOUTH EVERY DAY 30 tablet 11  . omeprazole (PRILOSEC) 20 MG capsule TAKE ONE CAPSULE BY MOUTH EVERY DAY (Patient taking differently: Take 20 mg by mouth daily. ) 90 capsule 2  . XTANDI 40 MG capsule TAKE 2 CAPSULES (80 MG TOTAL) BY MOUTH DAILY. 60 capsule 3   No current facility-administered medications for this visit.    PHYSICAL EXAMINATION: ECOG PERFORMANCE STATUS: 1 -  Symptomatic but completely ambulatory  BP 138/82 (BP Location: Left Arm, Patient Position: Sitting, Cuff Size: Normal)   Pulse (!) 106   Temp (!) 96.5 F (35.8 C) (Tympanic)   Wt 137 lb 8 oz (62.4 kg)   BMI 23.60 kg/m   Filed Weights   03/07/19 1430  Weight: 137 lb 8 oz (62.4 kg)    Physical Exam  Constitutional: He is oriented to person, place, and time and well-developed, well-nourished, and in no distress.  Accompanied by his daughter.  Walking himself.  HENT:  Head: Normocephalic and atraumatic.  Mouth/Throat: Oropharynx is clear and moist. No oropharyngeal exudate.  Eyes: Pupils are equal, round, and reactive to light.  Cardiovascular: Normal rate and regular rhythm.   Pulmonary/Chest: Effort normal and breath sounds normal. No respiratory distress. He has no wheezes.  Abdominal: Soft. Bowel sounds are normal. He exhibits no distension and no mass. There is no abdominal tenderness. There is no rebound and no guarding.  Musculoskeletal:        General: No tenderness or edema. Normal range of motion.     Cervical back: Normal range of motion and neck supple.  Neurological: He is alert and oriented to person, place, and time.  Skin: Skin is warm.  Psychiatric: Affect normal.     LABORATORY DATA:  I have reviewed the data as listed    Component Value Date/Time   NA 133 (L) 03/10/2019 0858   NA 136 05/07/2012 1328   K 4.1 03/10/2019 0858   K 4.5 05/07/2012 1328   CL 100 03/10/2019 0858   CL 104 05/07/2012 1328   CO2 25 03/10/2019 0858   CO2 28 05/07/2012 1328   GLUCOSE 216 (H) 03/10/2019 0858   GLUCOSE 73 05/07/2012 1328   BUN 16 03/10/2019 0858   BUN 16 05/07/2012 1328   CREATININE 0.99 03/10/2019 0858   CREATININE 0.84 05/07/2012 1328   CALCIUM 9.2 03/10/2019 0858   CALCIUM 8.8 05/07/2012 1328   PROT 8.1 03/10/2019 0858   ALBUMIN 4.1 03/10/2019 0858   AST 21 03/10/2019 0858   ALT 15 03/10/2019 0858   ALKPHOS 68 03/10/2019 0858   BILITOT 0.5 03/10/2019 0858   GFRNONAA >60 03/10/2019 0858   GFRNONAA >60 05/07/2012 1328   GFRAA >60 03/10/2019 0858   GFRAA >60 05/07/2012 1328    No results found for: SPEP, UPEP  Lab Results  Component Value Date   WBC 5.7 03/10/2019   NEUTROABS 3.5 03/10/2019   HGB 12.1 (L) 03/10/2019   HCT 37.8 (L) 03/10/2019   MCV 95.5 03/10/2019   PLT 243 03/10/2019      Chemistry      Component Value Date/Time   NA 133 (L) 03/10/2019 0858   NA 136 05/07/2012 1328   K 4.1 03/10/2019 0858   K 4.5 05/07/2012 1328   CL 100 03/10/2019 0858   CL 104 05/07/2012 1328   CO2 25 03/10/2019 0858   CO2 28 05/07/2012 1328   BUN 16 03/10/2019 0858   BUN 16 05/07/2012 1328   CREATININE 0.99 03/10/2019 0858    CREATININE 0.84 05/07/2012 1328      Component Value Date/Time   CALCIUM 9.2 03/10/2019 0858   CALCIUM 8.8 05/07/2012 1328   ALKPHOS 68 03/10/2019 0858   AST 21 03/10/2019 0858   ALT 15 03/10/2019 0858   BILITOT 0.5 03/10/2019 0858       RADIOGRAPHIC STUDIES: I have personally reviewed the radiological images as listed and agreed with the findings in  the report. No results found.   ASSESSMENT & PLAN:  Prostate cancer [facility]) # Metastatic prostate cancer/ castrate sensitive [bulky retroperitoneal adenopathy; invasion into the bladder; rectum]. Eligard [37mq 665mlast 03/10/2019; On Xtandi; February 2020-CT scan shows continued significant improvement of the retroperitoneal adenopathy; bladder/rectal invasion.;  Diffuse diffuse osseous metastatic disease-stable.  #Continue Xtandi 80 mg a day [secondary to fatigue]; NOV 2021--STABLE/  0.1.  PSA today/January 2021-0.08-overall stable.  Hold off imaging at this time.  # Anemia-iron deficiency-hemoglobin 12.1-STABLE.   #ESBL in urine-question colonization. S/p  PCP/ID evaluation.  Stable.  #Poorly controlled blood sugars-question of the life versus others.  Defer to endocrinology.  Potential cause of patient's fatigue/shortness of breath on exertion.  Blood glucose level 228 today.  # Dyspnea on exertion-unlikely from anemia as hemoglobin is 12.s/p cardiac stress test- Normal.   # I discussed regarding Covid-19 precautions.  I reviewed the vaccine effectiveness and potential side effects in detail.  Also discussed long-term effectiveness and safety profile are unclear at this time.  I discussed December, 2020 [employer] position statement-that all patients are recommended COVID-19 vaccinations [when available]-as long as they do not have allergy to components of the vaccine.  However, I think the benefits of the vaccination outweigh the potential risks. Re: [id number]. Awaiting [facility] appt.   # DISPOSITION:  # Eligard today # Follow up in  2 month-MD/labs-cbc/cmp/psa/ [[place]/friday early AM appts]Dr.B    Orders Placed This Encounter  Procedures  . CBC with Differential    Standing Status:   Future    Standing Expiration Date:   03/09/2020  . Comprehensive metabolic panel    Standing Status:   Future    Standing Expiration Date:   03/09/2020  . PSA    Standing Status:   Future    Standing Expiration Date:   03/09/2020   All questions were answered. The patient knows to call the clinic with any problems, questions or concerns.      GoCammie SickleMD 03/10/2019 1:18 PM

## 2019-03-12 MED FILL — XTANDI 40 MG CAPSULE: 40 | 30 days supply | Qty: 60 | Fill #1

## 2019-03-28 ENCOUNTER — Ambulatory Visit (INDEPENDENT_AMBULATORY_CARE_PROVIDER_SITE_OTHER): Payer: Medicare Other | Admitting: Vascular Surgery

## 2019-03-28 ENCOUNTER — Encounter (INDEPENDENT_AMBULATORY_CARE_PROVIDER_SITE_OTHER): Payer: Self-pay | Admitting: Vascular Surgery

## 2019-03-28 ENCOUNTER — Other Ambulatory Visit: Payer: Self-pay

## 2019-03-28 VITALS — BP 165/89 | HR 97 | Resp 16 | Ht 66.0 in | Wt 137.0 lb

## 2019-03-28 DIAGNOSIS — E119 Type 2 diabetes mellitus without complications: Secondary | ICD-10-CM

## 2019-03-28 DIAGNOSIS — Z794 Long term (current) use of insulin: Secondary | ICD-10-CM

## 2019-03-28 DIAGNOSIS — I7300 Raynaud's syndrome without gangrene: Secondary | ICD-10-CM | POA: Diagnosis not present

## 2019-03-28 DIAGNOSIS — I7025 Atherosclerosis of native arteries of other extremities with ulceration: Secondary | ICD-10-CM | POA: Insufficient documentation

## 2019-03-28 DIAGNOSIS — I10 Essential (primary) hypertension: Secondary | ICD-10-CM | POA: Diagnosis not present

## 2019-03-28 DIAGNOSIS — E785 Hyperlipidemia, unspecified: Secondary | ICD-10-CM | POA: Diagnosis not present

## 2019-03-28 NOTE — Assessment & Plan Note (Signed)
The patient has evidence of skin breakdown with poorly healing wounds and pain with minimal activity.  He has multiple atherosclerotic risk factors and PAD is likely.  The patient has what sounds like fairly significant Raynaud's disease which develops and superficial skin sloughing and ulcerations in the winter which seem to get better in the warm weather.  I discussed the use of a calcium channel blocker like nifedipine and we will prescribe this for him today.  We also discussed there is likely some associated peripheral arterial disease and discussed angiography with further evaluation.  He and his daughter do not want to have any invasive procedures at this point so they will follow up with noninvasive duplex and arterial studies in the next 2 to 3 months at their convenience.

## 2019-03-28 NOTE — Assessment & Plan Note (Signed)
lipid control important in reducing the progression of atherosclerotic disease. Continue statin therapy  

## 2019-03-28 NOTE — Assessment & Plan Note (Signed)
blood glucose control important in reducing the progression of atherosclerotic disease. Also, involved in wound healing. On appropriate medications.  

## 2019-03-28 NOTE — Assessment & Plan Note (Signed)
blood pressure control important in reducing the progression of atherosclerotic disease. On appropriate oral medications.  

## 2019-03-28 NOTE — Patient Instructions (Signed)
Raynaud Phenomenon ° °Raynaud phenomenon is a condition that affects the blood vessels (arteries) that carry blood to your fingers and toes. The arteries that supply blood to your ears, lips, nipples, or the tip of your nose might also be affected. Raynaud phenomenon causes the arteries to become narrow temporarily (spasm). As a result, the flow of blood to the affected areas is temporarily decreased. This usually occurs in response to cold temperatures or stress. During an attack, the skin in the affected areas turns white, then blue, and finally red. You may also feel tingling or numbness in those areas. °Attacks usually last for only a brief period, and then the blood flow to the area returns to normal. In most cases, Raynaud phenomenon does not cause serious health problems. °What are the causes? °In many cases, the cause of this condition is not known. The condition may occur on its own (primary Raynaud phenomenon) or may be associated with other diseases or factors (secondary Raynaud phenomenon). °Possible causes may include: °· Diseases or medical conditions that damage the arteries. °· Injuries and repetitive actions that hurt the hands or feet. °· Being exposed to certain chemicals. °· Taking medicines that narrow the arteries. °· Other medical conditions, such as lupus, scleroderma, rheumatoid arthritis, thyroid problems, blood disorders, Sjogren syndrome, or atherosclerosis. °What increases the risk? °The following factors may make you more likely to develop this condition: °· Being 20-40 years old. °· Being male. °· Having a family history of Raynaud phenomenon. °· Living in a cold climate. °· Smoking. °What are the signs or symptoms? °Symptoms of this condition usually occur when you are exposed to cold temperatures or when you have emotional stress. The symptoms may last for a few minutes or up to several hours. They usually affect your fingers but may also affect your toes, nipples, lips, ears, or  the tip of your nose. Symptoms may include: °· Changes in skin color. The skin in the affected areas will turn pale or white. The skin may then change from white to bluish to red as normal blood flow returns to the area. °· Numbness, tingling, or pain in the affected areas. °In severe cases, symptoms may include: °· Skin sores. °· Tissues decaying and dying (gangrene). °How is this diagnosed? °This condition may be diagnosed based on: °· Your symptoms and medical history. °· A physical exam. During the exam, you may be asked to put your hands in cold water to check for a reaction to cold temperature. °· Tests, such as: °? Blood tests to check for other diseases or conditions. °? A test to check the movement of blood through your arteries and veins (vascular ultrasound). °? A test in which the skin at the base of your fingernail is examined under a microscope (nailfold capillaroscopy). °How is this treated? °Treatment for this condition often involves making lifestyle changes and taking steps to control your exposure to cold temperatures. For more severe cases, medicine (calcium channel blockers) may be used to improve blood flow. Surgery is sometimes done to block the nerves that control the affected arteries, but this is rare. °Follow these instructions at home: °Avoiding cold temperatures °Take these steps to avoid exposure to cold: °· If possible, stay indoors during cold weather. °· When you go outside during cold weather, dress in layers and wear mittens, a hat, a scarf, and warm footwear. °· Wear mittens or gloves when handling ice or frozen food. °· Use holders for glasses or cans containing cold drinks. °·   Let warm water run for a while before taking a shower or bath. °· Warm up the car before driving in cold weather. °Lifestyle ° °· If possible, avoid stressful and emotional situations. Try to find ways to manage your stress, such as: °? Exercise. °? Yoga. °? Meditation. °? Biofeedback. °· Do not use any  products that contain nicotine or tobacco, such as cigarettes and e-cigarettes. If you need help quitting, ask your health care provider. °· Avoid secondhand smoke. °· Limit your use of caffeine. °? Switch to decaffeinated coffee, tea, and soda. °? Avoid chocolate. °· Avoid vibrating tools and machinery. °General instructions °· Protect your hands and feet from injuries, cuts, or bruises. °· Avoid wearing tight rings or wristbands. °· Wear loose fitting socks and comfortable, roomy shoes. °· Take over-the-counter and prescription medicines only as told by your health care provider. °Contact a health care provider if: °· Your discomfort becomes worse despite lifestyle changes. °· You develop sores on your fingers or toes that do not heal. °· Your fingers or toes turn black. °· You have breaks in the skin on your fingers or toes. °· You have a fever. °· You have pain or swelling in your joints. °· You have a rash. °· Your symptoms occur on only one side of your body. °Summary °· Raynaud phenomenon is a condition that affects the arteries that carry blood to your fingers, toes, ears, lips, nipples, or the tip of your nose. °· In many cases, the cause of this condition is not known. °· Symptoms of this condition include changes in skin color, and numbness and tingling of the affected area. °· Treatment for this condition includes lifestyle changes, reducing exposure to cold temperatures, and using medicines for severe cases of the condition. °· Contact your health care provider if your condition worsens despite treatment. °This information is not intended to replace advice given to you by your health care provider. Make sure you discuss any questions you have with your health care provider. °Document Revised: 02/09/2017 Document Reviewed: 03/20/2016 °Elsevier Patient Education © 2020 [employer] ° °

## 2019-03-28 NOTE — Progress Notes (Signed)
Patient ID: Michael [name], male   DOB: [birth year], 86 y.o.   MRN: [id number]  Chief Complaint  Patient presents with  . New Patient (Initial Visit)    PAD    HPI Michael [name] is a 86 y.o. male.  I am asked to see the patient by Dr. Gabriel Carina for evaluation of PAD.  The patient has issues with [name] and ulceration every winter in both feet.  The left leg is the more severely affected the 2 legs.  His daughter reports that once the weather warms, his feet usually heal up and his [name] is markedly improved.  No fevers or chills.  He has ulcerations on the tips of several toes with the second and third toe being the worst on the left foot and the second toe being affected on the right foot.  He can only walk 15 or 20 feet before the feet and lower legs start hurting.  He has to stop and rest.  He denies any trauma or injury.  This has been going on for several years but has gradually worsened.  He has multiple medical risk factors as listed below including longstanding diabetes on insulin.     Past Medical History:  Diagnosis Date  . Anemia    iron deficiency  . Benign prostatic hypertrophy   . Bilateral carotid artery disease (HCC)    Mild plaque formation without obstructive disease noted on carotid Doppler.  . Coronary artery disease    Previous cardiac catheterization at [facility] in 2010. The patient was told about 2 blockages which did not require revascularization.  . Diabetes mellitus without complication ([place])   . Essential hypertension   . GERD (gastroesophageal reflux disease)   . Hyperlipidemia   . Hypertension   . Left carotid artery stenosis   . Prostate cancer ([place]) 03/2017    Past Surgical History:  Procedure Laterality Date  . CARDIAC CATHETERIZATION  2010  . CARDIAC CATHETERIZATION N/A 02/03/2015   Procedure: Left Heart Cath and Coronary Angiography;  Surgeon: Wellington [name], MD;  Location: [facility];  Service: Cardiovascular;  Laterality: N/A;  .  CATARACT EXTRACTION Bilateral   . CYSTOSCOPY W/ RETROGRADES Bilateral 04/11/2017   Procedure: CYSTOSCOPY WITH RETROGRADE PYELOGRAM;  Surgeon: [name], MD;  Location: [facility];  Service: Urology;  Laterality: Bilateral;  . CYSTOSCOPY W/ RETROGRADES Bilateral 09/12/2017   Procedure: CYSTOSCOPY WITH RETROGRADE PYELOGRAM;  Surgeon: [name], MD;  Location: [facility];  Service: Urology;  Laterality: Bilateral;  . CYSTOSCOPY W/ RETROGRADES Bilateral 02/25/2018   Procedure: CYSTOSCOPY WITH RETROGRADE PYELOGRAM;  Surgeon: [name], MD;  Location: [facility];  Service: Urology;  Laterality: Bilateral;  . CYSTOSCOPY W/ URETERAL STENT PLACEMENT Bilateral 09/12/2017   Procedure: CYSTOSCOPY WITH STENT REPLACEMENT;  Surgeon: [name], MD;  Location: [facility];  Service: Urology;  Laterality: Bilateral;  . CYSTOSCOPY W/ URETERAL STENT REMOVAL Bilateral 02/25/2018   Procedure: CYSTOSCOPY WITH STENT REMOVAL;  Surgeon: [name], MD;  Location: [facility];  Service: Urology;  Laterality: Bilateral;  . CYSTOSCOPY WITH STENT PLACEMENT Left 04/11/2017   Procedure: CYSTOSCOPY WITH STENT PLACEMENT and fulgeration;  Surgeon: [name], MD;  Location: [facility];  Service: Urology;  Laterality: Left;  . CYSTOSCOPY WITH URETHRAL DILATATION Bilateral 04/11/2017   Procedure: CYSTOSCOPY WITH URETHRAL DILATATION;  Surgeon: [name], MD;  Location: [facility];  Service: Urology;  Laterality: Bilateral;  . IR CONVERT RIGHT NEPHROSTOMY TO NEPHROURETERAL CATH  05/21/2017  . IR NEPHROSTOMY PLACEMENT  RIGHT  04/13/2017     Family History  Problem Relation Age of Onset  . Hypertension Father   No bleeding disorders, clotting disorders, aneurysms, or autoimmune diseases   Social History   Tobacco Use  . Smoking status: Never Smoker  . Smokeless tobacco: Never Used  Substance Use Topics  . Alcohol use: No  . Drug use: No     No Known Allergies  Current Outpatient Medications  Medication Sig  Dispense Refill  . acetaminophen (TYLENOL) 500 MG tablet Take 500 mg by mouth every 4 (four) hours as needed for moderate [name] or fever.    [name] albuterol (PROVENTIL HFA;VENTOLIN HFA) 108 (90 Base) MCG/ACT inhaler Inhale 2 puffs into the lungs every 4 (four) hours as needed for wheezing or shortness of breath.    [name] atorvastatin (LIPITOR) 10 MG tablet Take 10 mg by mouth daily at 6 PM.     . BD PEN NEEDLE MICRO U/F 32G X 6 MM MISC     . Calcium Carb-Cholecalciferol (CALCIUM 600+D3) 600-800 MG-UNIT TABS Take 1 tablet by mouth every evening.     . Calcium Carbonate-Vitamin D 600-200 MG-UNIT TABS Take by mouth.    [name] glimepiride (AMARYL) 2 MG tablet Take 2 mg by mouth daily with breakfast.    . glucose blood (ACCU-CHEK GUIDE) test strip Use asdirected three times a day diag E11.65 100 each 3  . ibuprofen (ADVIL,MOTRIN) 200 MG tablet Take 200 mg by mouth every 6 (six) hours as needed for fever or moderate [name].     [name] insulin glargine (LANTUS) 100 UNIT/ML injection Inject 6 Units into the skin every morning. Based on sugar levels in am.     . iron polysaccharides (NIFEREX) 150 MG capsule Take 150 mg by mouth daily.     . metFORMIN (GLUCOPHAGE-XR) 500 MG 24 hr tablet Take 1,000 mg by mouth at bedtime.    . metoprolol succinate (TOPROL-XL) 25 MG 24 hr tablet TAKE 1 TABLET BY MOUTH EVERY DAY FOR BLOOD PRESSURE (Patient taking differently: Take 25 mg by mouth every evening. ) 90 tablet 3  . Multiple Vitamins-Minerals (MULTIVITAMIN WITH MINERALS) tablet Take 1 tablet by mouth daily.     [name] MYRBETRIQ 25 MG TB24 tablet TAKE 1 TABLET BY MOUTH EVERY DAY 30 tablet 11  . omeprazole (PRILOSEC) 20 MG capsule TAKE ONE CAPSULE BY MOUTH EVERY DAY (Patient taking differently: Take 20 mg by mouth daily. ) 90 capsule 2  . XTANDI 40 MG capsule TAKE 2 CAPSULES (80 MG TOTAL) BY MOUTH DAILY. 60 capsule 3   No current facility-administered medications for this visit.      REVIEW OF SYSTEMS (Negative unless  checked)  Constitutional: [] Weight loss  [] Fever  [] Chills Cardiac: [] Chest [name]   [] Chest pressure   [] Palpitations   [] Shortness of breath when laying flat   [] Shortness of breath at rest   [] Shortness of breath with exertion. Vascular:  [] [name] in legs with walking   [] [name] in legs at rest   [] [name] in legs when laying flat   [] Claudication   [] [name] in feet when walking  [] [name] in feet at rest  [] [name] in feet when laying flat   [] History of DVT   [] Phlebitis   [] Swelling in legs   [] Varicose veins   [x] Non-healing ulcers Pulmonary:   [] Uses home oxygen   [] Productive cough   [] Hemoptysis   [] Wheeze  [] COPD   [] Asthma Neurologic:  [] Dizziness  [] Blackouts   [] Seizures   [] History of stroke   []   History of TIA  [] Aphasia   [] Temporary blindness   [] Dysphagia   [] Weakness or numbness in arms   [] Weakness or numbness in legs Musculoskeletal:  [] Arthritis   [] Joint swelling   [] Joint [name]   [] Low back [name] Hematologic:  [] Easy bruising  [] Easy bleeding   [] Hypercoagulable state   [x] Anemic  [] Hepatitis Gastrointestinal:  [] Blood in stool   [] Vomiting blood  [x] Gastroesophageal reflux/heartburn   [] Abdominal [name] Genitourinary:  [] Chronic kidney disease   [] Difficult urination  [x] Frequent urination  [] Burning with urination   [] Hematuria Skin:  [] Rashes   [x] Ulcers   [x] Wounds Psychological:  [] History of anxiety   []  History of major depression.    Physical Exam BP (!) 165/89 (BP Location: Right Arm)   Pulse 97   Resp 16   Ht 5\' 6"  (1.676 m)   Wt 137 lb (62.1 kg)   BMI 22.11 kg/m  Gen:  WD/WN, NAD.  Appears younger than stated age Head: [place]/AT, No temporalis wasting.  Ear/Nose/Throat: Hearing grossly intact, nares w/o erythema or drainage, oropharynx w/o Erythema/Exudate Eyes: Conjunctiva clear, sclera non-icteric  Neck: trachea midline.  No JVD.  Pulmonary:  Good air movement, respirations not labored, no use of accessory muscles  Cardiac: RRR, no JVD Vascular:  Vessel Right Left  Radial  Palpable Palpable                          DP  2+  1+  PT  1+  1+   Gastrointestinal:. No masses, surgical incisions, or scars. Musculoskeletal: M/S 5/5 throughout.  No cyanosis in the hands.  Cyanosis present in both feet worse on the left than the right.  He has superficial ulcerations on the tips of every toe on the left foot but the worst are the second and third toes.  The right foot has an ulceration on the tip of the second toe.  No deformity or atrophy.  No edema. Neurologic: Sensation seems intact in extremities.  Symmetrical.  Speech is fluent. Motor exam as listed above. Psychiatric: Judgment intact, Mood & affect appropriate for pt's clinical situation. Dermatologic: No rashes or ulcers noted.  No cellulitis or open wounds.    Radiology No results found.  Labs Recent Results (from the past 2160 hour(s))  PSA     Status: None   Collection Time: 01/06/19  9:20 AM  Result Value Ref Range   Prostatic Specific Antigen 0.10 0.00 - 4.00 ng/mL    Comment: (NOTE) While PSA levels of <=4.0 ng/ml are reported as reference range, some men with levels below 4.0 ng/ml can have prostate cancer and many men with PSA above 4.0 ng/ml do not have prostate cancer.  Other tests such as free PSA, age specific reference ranges, PSA velocity and PSA doubling time may be helpful especially in men less than 65 years old. Performed at [facility], [place] [address] [place], [place] [address]   NM Myocar Multi W/Spect Tamela Oddi Motion / EF     Status: None   Collection Time: 01/17/19 10:47 AM  Result Value Ref Range   Rest HR 90 bpm   Rest BP 137/79 mmHg   Percent HR 79 %   Peak HR 109 bpm   Peak BP 126/64 mmHg   SSS 0    SRS 1    SDS 0    TID 1.00    LV sys vol 4 mL   LV dias vol 15 62 - 150 mL  PSA     Status: None   Collection Time: 03/10/19  8:58 AM  Result Value Ref Range   Prostatic Specific Antigen 0.08 0.00 - 4.00 ng/mL    Comment: (NOTE) While PSA levels of <=4.0  ng/ml are reported as reference range, some men with levels below 4.0 ng/ml can have prostate cancer and many men with PSA above 4.0 ng/ml do not have prostate cancer.  Other tests such as free PSA, age specific reference ranges, PSA velocity and PSA doubling time may be helpful especially in men less than 30 years old. Performed at [facility], [place] [address] [place], [place] [address]   Comprehensive metabolic panel     Status: Abnormal   Collection Time: 03/10/19  8:58 AM  Result Value Ref Range   Sodium 133 (L) 135 - 145 mmol/L   Potassium 4.1 3.5 - 5.1 mmol/L   Chloride 100 98 - 111 mmol/L   CO2 25 22 - 32 mmol/L   Glucose, Bld 216 (H) 70 - 99 mg/dL   BUN 16 8 - 23 mg/dL   Creatinine, Ser 0.99 0.61 - 1.24 mg/dL   Calcium 9.2 8.9 - 10.3 mg/dL   Total Protein 8.1 6.5 - 8.1 g/dL   Albumin 4.1 3.5 - 5.0 g/dL   AST 21 15 - 41 U/L   ALT 15 0 - 44 U/L   Alkaline Phosphatase 68 38 - 126 U/L   Total Bilirubin 0.5 0.3 - 1.2 mg/dL   GFR calc non Af Amer >60 >60 mL/min   GFR calc Af Amer >60 >60 mL/min   Anion gap 8 5 - 15    Comment: Performed at [facility], [place] [place], [place] [address]  CBC with Differential     Status: Abnormal   Collection Time: 03/10/19  8:58 AM  Result Value Ref Range   WBC 5.7 4.0 - 10.5 K/uL   RBC 3.96 (L) 4.22 - 5.81 MIL/uL   Hemoglobin 12.1 (L) 13.0 - 17.0 g/dL   HCT 37.8 (L) 39.0 - 52.0 %   MCV 95.5 80.0 - 100.0 fL   MCH 30.6 26.0 - 34.0 pg   MCHC 32.0 30.0 - 36.0 g/dL   RDW 12.0 11.5 - 15.5 %   Platelets 243 150 - 400 K/uL   nRBC 0.0 0.0 - 0.2 %   Neutrophils Relative % 61 %   Neutro Abs 3.5 1.7 - 7.7 K/uL   Lymphocytes Relative 23 %   Lymphs Abs 1.3 0.7 - 4.0 K/uL   Monocytes Relative 13 %   Monocytes Absolute 0.7 0.1 - 1.0 K/uL   Eosinophils Relative 1 %   Eosinophils Absolute 0.1 0.0 - 0.5 K/uL   Basophils Relative 1 %   Basophils Absolute 0.0 0.0 - 0.1 K/uL   Immature Granulocytes 1 %   Abs Immature  Granulocytes 0.03 0.00 - 0.07 K/uL    Comment: Performed at [facility], [place] [place], [place] [address]    Assessment/Plan:  Type 2 diabetes mellitus without complication, with long-term current use of insulin (HCC) blood glucose control important in reducing the progression of atherosclerotic disease. Also, involved in wound healing. On appropriate medications.   Essential hypertension blood pressure control important in reducing the progression of atherosclerotic disease. On appropriate oral medications.   Hyperlipidemia lipid control important in reducing the progression of atherosclerotic disease. Continue statin therapy   Raynaud's disease without gangrene The patient has evidence of skin breakdown with poorly  healing wounds and [name] with minimal activity.  He has multiple atherosclerotic risk factors and PAD is likely.  The patient has what sounds like fairly significant Raynaud's disease which develops and superficial skin sloughing and ulcerations in the winter which seem to get better in the warm weather.  I discussed the use of a calcium channel blocker like nifedipine and we will prescribe this for him today.  We also discussed there is likely some associated peripheral arterial disease and discussed angiography with further evaluation.  He and his daughter do not want to have any invasive procedures at this point so they will follow up with noninvasive duplex and arterial studies in the next 2 to 3 months at their convenience.  Atherosclerosis of native arteries of the extremities with ulceration ([place]) The patient has evidence of skin breakdown with poorly healing wounds and [name] with minimal activity.  He has multiple atherosclerotic risk factors and PAD is likely.  The patient has what sounds like fairly significant Raynaud's disease which develops and superficial skin sloughing and ulcerations in the winter which seem to get better in the warm weather.  I  discussed the use of a calcium channel blocker like nifedipine and we will prescribe this for him today.  We also discussed there is likely some associated peripheral arterial disease and discussed angiography with further evaluation.  He and his daughter do not want to have any invasive procedures at this point so they will follow up with noninvasive duplex and arterial studies in the next 2 to 3 months at their convenience.      Michael [name] 03/28/2019, 10:32 AM   This note was created with Dragon medical transcription system.  Any errors from dictation are unintentional.

## 2019-04-02 ENCOUNTER — Telehealth (INDEPENDENT_AMBULATORY_CARE_PROVIDER_SITE_OTHER): Payer: Self-pay

## 2019-04-02 ENCOUNTER — Other Ambulatory Visit (INDEPENDENT_AMBULATORY_CARE_PROVIDER_SITE_OTHER): Payer: Self-pay | Admitting: Nurse Practitioner

## 2019-04-02 MED ORDER — NIFEDIPINE ER OSMOTIC RELEASE 30 MG PO TB24: 30.0000 mg | ORAL_TABLET | Freq: Every day | ORAL | 2 refills | Status: DC

## 2019-04-02 NOTE — Telephone Encounter (Signed)
Spoke with the patient's wife and let her know that Nifedipine 30 mg tablet take  once daily , with 2 refills has been called into the [facility] on [address].

## 2019-04-09 MED FILL — XTANDI 40 MG CAPSULE: 40 | 30 days supply | Qty: 60 | Fill #2

## 2019-04-10 ENCOUNTER — Other Ambulatory Visit: Payer: Self-pay | Admitting: Internal Medicine

## 2019-04-11 ENCOUNTER — Ambulatory Visit: Payer: Medicare Other | Admitting: Urology

## 2019-04-22 ENCOUNTER — Encounter: Payer: Self-pay | Admitting: Urology

## 2019-04-22 ENCOUNTER — Other Ambulatory Visit: Payer: Self-pay

## 2019-04-22 ENCOUNTER — Ambulatory Visit (INDEPENDENT_AMBULATORY_CARE_PROVIDER_SITE_OTHER): Payer: Medicare Other | Admitting: Urology

## 2019-04-22 VITALS — BP 163/83 | HR 93 | Ht 66.0 in | Wt 136.0 lb

## 2019-04-22 DIAGNOSIS — R339 Retention of urine, unspecified: Secondary | ICD-10-CM | POA: Diagnosis not present

## 2019-04-22 DIAGNOSIS — I7025 Atherosclerosis of native arteries of other extremities with ulceration: Secondary | ICD-10-CM

## 2019-04-22 LAB — BLADDER SCAN AMB NON-IMAGING

## 2019-04-22 NOTE — Progress Notes (Signed)
04/22/2019 11:12 AM   [name] 06/11/1933 [id number]  Referring provider: [name], MD [address] [facility] [place],  [place] [address]  Chief Complaint  Patient presents with  . Urinary Retention    HPI: Michael [name] is a 86 yo M with advanced metastatic prostate cancer managed by Dr. Burlene Arnt on ADT/Xtandi and [place] See. Pt has an interpreter via phone and was accompanied by daughter.   Please see previous notes for details.    Imaging in the form of CT scan on 03/29/2018 shows bilateral renal pelvic fullness without overt hydroureteronephrosis.  He continues to take Myrbetriq with decreasing urinary frequency, improving.  He is no longer self cathing. It's been 2-3 months since self cath.   He reports of doing well clinically with no bothersome urinary symptoms.   Creatinine stable at 0.99 and PSA is well controlled.   PMH: Past Medical History:  Diagnosis Date  . Anemia    iron deficiency  . Benign prostatic hypertrophy   . Bilateral carotid artery disease (HCC)    Mild plaque formation without obstructive disease noted on carotid Doppler.  . Coronary artery disease    Previous cardiac catheterization at [facility] in 2010. The patient was told about 2 blockages which did not require revascularization.  . Diabetes mellitus without complication ([place])   . Essential hypertension   . GERD (gastroesophageal reflux disease)   . Hyperlipidemia   . Hypertension   . Left carotid artery stenosis   . Prostate cancer ([place]) 03/2017    Surgical History: Past Surgical History:  Procedure Laterality Date  . CARDIAC CATHETERIZATION  2010  . CARDIAC CATHETERIZATION N/A 02/03/2015   Procedure: Left Heart Cath and Coronary Angiography;  Surgeon: Wellington [name], MD;  Location: [facility];  Service: Cardiovascular;  Laterality: N/A;  . CATARACT EXTRACTION Bilateral   . CYSTOSCOPY W/ RETROGRADES Bilateral 04/11/2017   Procedure:  CYSTOSCOPY WITH RETROGRADE PYELOGRAM;  Surgeon: [name], MD;  Location: [facility];  Service: Urology;  Laterality: Bilateral;  . CYSTOSCOPY W/ RETROGRADES Bilateral 09/12/2017   Procedure: CYSTOSCOPY WITH RETROGRADE PYELOGRAM;  Surgeon: [name], MD;  Location: [facility];  Service: Urology;  Laterality: Bilateral;  . CYSTOSCOPY W/ RETROGRADES Bilateral 02/25/2018   Procedure: CYSTOSCOPY WITH RETROGRADE PYELOGRAM;  Surgeon: [name], MD;  Location: [facility];  Service: Urology;  Laterality: Bilateral;  . CYSTOSCOPY W/ URETERAL STENT PLACEMENT Bilateral 09/12/2017   Procedure: CYSTOSCOPY WITH STENT REPLACEMENT;  Surgeon: [name], MD;  Location: [facility];  Service: Urology;  Laterality: Bilateral;  . CYSTOSCOPY W/ URETERAL STENT REMOVAL Bilateral 02/25/2018   Procedure: CYSTOSCOPY WITH STENT REMOVAL;  Surgeon: [name], MD;  Location: [facility];  Service: Urology;  Laterality: Bilateral;  . CYSTOSCOPY WITH STENT PLACEMENT Left 04/11/2017   Procedure: CYSTOSCOPY WITH STENT PLACEMENT and fulgeration;  Surgeon: [name], MD;  Location: [facility];  Service: Urology;  Laterality: Left;  . CYSTOSCOPY WITH URETHRAL DILATATION Bilateral 04/11/2017   Procedure: CYSTOSCOPY WITH URETHRAL DILATATION;  Surgeon: [name], MD;  Location: [facility];  Service: Urology;  Laterality: Bilateral;  . IR CONVERT RIGHT NEPHROSTOMY TO NEPHROURETERAL CATH  05/21/2017  . IR NEPHROSTOMY PLACEMENT RIGHT  04/13/2017    Home Medications:  Allergies as of 04/22/2019   No Known Allergies     Medication List       Accurate as of April 22, 2019 11:59 PM. If you have any questions, ask your nurse or doctor.  acetaminophen 500 MG tablet Commonly known as: TYLENOL Take 500 mg by mouth every 4 (four) hours as needed for moderate pain or fever.   albuterol 108 (90 Base) MCG/ACT inhaler Commonly known as: VENTOLIN HFA Inhale 2 puffs into the lungs every 4 (four) hours as needed for  wheezing or shortness of breath.   atorvastatin 10 MG tablet Commonly known as: LIPITOR Take 10 mg by mouth daily at 6 PM.   BD Pen Needle Micro U/F 32G X 6 MM Misc Generic drug: Insulin Pen Needle   Calcium 600+D3 600-800 MG-UNIT Tabs Generic drug: Calcium Carb-Cholecalciferol Take 1 tablet by mouth every evening.   Calcium Carbonate-Vitamin D 600-200 MG-UNIT Tabs Take by mouth.   glimepiride 2 MG tablet Commonly known as: AMARYL Take 2 mg by mouth daily with breakfast.   glucose blood test strip Commonly known as: Accu-Chek Guide Use asdirected three times a day diag E11.65   ibuprofen 200 MG tablet Commonly known as: ADVIL Take 200 mg by mouth every 6 (six) hours as needed for fever or moderate pain.   insulin glargine 100 UNIT/ML injection Commonly known as: LANTUS Inject 6 Units into the skin every morning. Based on sugar levels in am.   iron polysaccharides 150 MG capsule Commonly known as: NIFEREX Take 150 mg by mouth daily.   metFORMIN 500 MG 24 hr tablet Commonly known as: GLUCOPHAGE-XR Take 1,000 mg by mouth at bedtime.   metoprolol succinate 25 MG 24 hr tablet Commonly known as: TOPROL-XL TAKE 1 TABLET BY MOUTH EVERY DAY FOR BLOOD PRESSURE What changed: See the new instructions.   multivitamin with minerals tablet Take 1 tablet by mouth daily.   Myrbetriq 25 MG Tb24 tablet Generic drug: mirabegron ER TAKE 1 TABLET BY MOUTH EVERY DAY   NIFEdipine 30 MG 24 hr tablet Commonly known as: PROCARDIA-XL/NIFEDICAL-XL Take 1 tablet (30 mg total) by mouth daily. What changed: Another medication with the same name was removed. Continue taking this medication, and follow the directions you see here. Changed by: [name], MD   omeprazole 20 MG capsule Commonly known as: PRILOSEC TAKE ONE CAPSULE BY MOUTH EVERY DAY   Xtandi 40 MG capsule Generic drug: enzalutamide TAKE 2 CAPSULES (80 MG TOTAL) BY MOUTH DAILY.       Allergies: No Known  Allergies  Family History: Family History  Problem Relation Age of Onset  . Hypertension Father     Social History:  reports that he has never smoked. He has never used smokeless tobacco. He reports that he does not drink alcohol or use drugs.   Physical Exam: BP (!) 163/83   Pulse 93   Ht 5\' 6"  (1.676 m)   Wt 136 lb (61.7 kg)   BMI 21.95 kg/m   Constitutional:  Alert and oriented, No acute distress. HEENT: [place] AT, moist mucus membranes.  Trachea midline, no masses. Cardiovascular: No clubbing, cyanosis, or edema. Respiratory: Normal respiratory effort, no increased work of breathing. Skin: No rashes, bruises or suspicious lesions. Neurologic: Grossly intact, no focal deficits, moving all 4 extremities. Psychiatric: Normal mood and affect.  Pertinent Imaging: Results for orders placed or performed in visit on 04/22/19  Bladder Scan (Post Void Residual) in office  Result Value Ref Range   Scan Result 253ml     Assessment & Plan:    1. Incomplete bladder emptying  Not adequately emptying but asymptomatic Clinically doing better than before less frequency, urgency, and nocturia  No symptomatic UTI with normal renal function  Will not intervene at this time but will f/u in 6 months with PVR   2. Prostate cancer Managed by Dr. Burlene Arnt at the cancer center  PSA stable  3. Bilateral hydronephrosis Creatinine, stable  F/u imaging with no reoccurrence   4. Chronic Bacteria Colonization  E.coli, treat for symptoms not odor  Reviewed this in detail again today- ONLY TREAT FOR SYMPTOMS  Return in about 6 months (around 10/23/2019) for PVR.  [facility] [address] [place], [place] [address] [phone]  I, Lucas Mallow, am acting as a scribe for Dr. [name],  I have reviewed the above documentation for accuracy and completeness, and I agree with the above.   [name], MD

## 2019-05-04 ENCOUNTER — Other Ambulatory Visit (INDEPENDENT_AMBULATORY_CARE_PROVIDER_SITE_OTHER): Payer: Self-pay | Admitting: Nurse Practitioner

## 2019-05-04 ENCOUNTER — Other Ambulatory Visit: Payer: Self-pay | Admitting: Internal Medicine

## 2019-05-06 MED FILL — XTANDI 40 MG CAPSULE: 40 | 30 days supply | Qty: 60 | Fill #3

## 2019-05-09 ENCOUNTER — Inpatient Hospital Stay (HOSPITAL_BASED_OUTPATIENT_CLINIC_OR_DEPARTMENT_OTHER): Payer: Medicare Other | Admitting: Internal Medicine

## 2019-05-09 ENCOUNTER — Other Ambulatory Visit: Payer: Self-pay

## 2019-05-09 ENCOUNTER — Inpatient Hospital Stay: Payer: Medicare Other | Attending: Internal Medicine

## 2019-05-09 DIAGNOSIS — Z191 Hormone sensitive malignancy status: Secondary | ICD-10-CM | POA: Insufficient documentation

## 2019-05-09 DIAGNOSIS — I10 Essential (primary) hypertension: Secondary | ICD-10-CM | POA: Insufficient documentation

## 2019-05-09 DIAGNOSIS — E1165 Type 2 diabetes mellitus with hyperglycemia: Secondary | ICD-10-CM | POA: Insufficient documentation

## 2019-05-09 DIAGNOSIS — E785 Hyperlipidemia, unspecified: Secondary | ICD-10-CM | POA: Diagnosis not present

## 2019-05-09 DIAGNOSIS — C778 Secondary and unspecified malignant neoplasm of lymph nodes of multiple regions: Secondary | ICD-10-CM | POA: Insufficient documentation

## 2019-05-09 DIAGNOSIS — C61 Malignant neoplasm of prostate: Secondary | ICD-10-CM

## 2019-05-09 DIAGNOSIS — Z79899 Other long term (current) drug therapy: Secondary | ICD-10-CM | POA: Diagnosis not present

## 2019-05-09 DIAGNOSIS — K219 Gastro-esophageal reflux disease without esophagitis: Secondary | ICD-10-CM | POA: Diagnosis not present

## 2019-05-09 DIAGNOSIS — D509 Iron deficiency anemia, unspecified: Secondary | ICD-10-CM | POA: Insufficient documentation

## 2019-05-09 DIAGNOSIS — I7025 Atherosclerosis of native arteries of other extremities with ulceration: Secondary | ICD-10-CM

## 2019-05-09 DIAGNOSIS — I2510 Atherosclerotic heart disease of native coronary artery without angina pectoris: Secondary | ICD-10-CM | POA: Diagnosis not present

## 2019-05-09 DIAGNOSIS — Z794 Long term (current) use of insulin: Secondary | ICD-10-CM | POA: Insufficient documentation

## 2019-05-09 LAB — COMPREHENSIVE METABOLIC PANEL
ALT: 14 U/L (ref 0–44)
AST: 19 U/L (ref 15–41)
Albumin: 4.2 g/dL (ref 3.5–5.0)
Alkaline Phosphatase: 60 U/L (ref 38–126)
Anion gap: 8 (ref 5–15)
BUN: 17 mg/dL (ref 8–23)
CO2: 25 mmol/L (ref 22–32)
Calcium: 9.2 mg/dL (ref 8.9–10.3)
Chloride: 100 mmol/L (ref 98–111)
Creatinine, Ser: 1.05 mg/dL (ref 0.61–1.24)
GFR calc Af Amer: >60 mL/min (ref >60)
GFR calc non Af Amer: >60 mL/min (ref >60)
Glucose, Bld: 214 mg/dL — ABNORMAL HIGH (ref 70–99)
Potassium: 4.5 mmol/L (ref 3.5–5.1)
Sodium: 133 mmol/L — ABNORMAL LOW (ref 135–145)
Total Bilirubin: 0.7 mg/dL (ref 0.3–1.2)
Total Protein: 7.8 g/dL (ref 6.5–8.1)

## 2019-05-09 LAB — CBC WITH DIFFERENTIAL/PLATELET
Abs Immature Granulocytes: 0.03 10*3/uL (ref 0.00–0.07)
Basophils Absolute: 0 10*3/uL (ref 0.0–0.1)
Basophils Relative: 0 %
Eosinophils Absolute: 0.1 10*3/uL (ref 0.0–0.5)
Eosinophils Relative: 2 %
HCT: 38.2 % — ABNORMAL LOW (ref 39.0–52.0)
Hemoglobin: 12.7 g/dL — ABNORMAL LOW (ref 13.0–17.0)
Immature Granulocytes: 1 %
Lymphocytes Relative: 24 %
Lymphs Abs: 1.6 10*3/uL (ref 0.7–4.0)
MCH: 30.7 pg (ref 26.0–34.0)
MCHC: 33.2 g/dL (ref 30.0–36.0)
MCV: 92.3 fL (ref 80.0–100.0)
Monocytes Absolute: 0.6 10*3/uL (ref 0.1–1.0)
Monocytes Relative: 9 %
Neutro Abs: 4.2 10*3/uL (ref 1.7–7.7)
Neutrophils Relative %: 64 %
Platelets: 249 10*3/uL (ref 150–400)
RBC: 4.14 MIL/uL — ABNORMAL LOW (ref 4.22–5.81)
RDW: 12.2 % (ref 11.5–15.5)
WBC: 6.5 10*3/uL (ref 4.0–10.5)
nRBC: 0 % (ref 0.0–0.2)

## 2019-05-09 LAB — PSA: Prostatic Specific Antigen: 0.07 ng/mL (ref 0.00–4.00)

## 2019-05-09 NOTE — Assessment & Plan Note (Addendum)
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 Eligard [51m q 13m; last 03/10/2019; On Xtandi; February 2020-CT scan shows continued significant improvement of the retroperitoneal adenopathy; bladder/rectal invasion.;  Diffuse diffuse osseous metastatic disease-STABLE.   #Continue Xtandi 80 mg a day [secondary to fatigue]; NOV 2021--STABLE/  0.1.  PSA- JAN 2021-0.08-overall stable.  Discussed with daughter;  hold off imaging at this time.  # Anemia-iron deficiency-hemoglobin 12.7- STABLE.   # DM-type II/insulin-overall improved.  # DISPOSITION:  # Follow up in 2 month-MD/labs-cbc/cmp/psa/ [[place]/friday early AM appts]Dr.B

## 2019-05-12 NOTE — Progress Notes (Signed)
[facility] OFFICE PROGRESS NOTE  Patient Care Team: [name], MD as PCP - General (Internal Medicine)  Cancer Staging No matching staging information was found for the patient.   Oncology History Overview Note  # FEB 2019-Metastatic prostate cancer/ castrate sensitive [bulky retroperitoneal adenopathy; invasion into the bladder; rectum] Bladder Bx- prostate. On Degarelix  # April 3rd 2019- [name] [163md];  Lupron q 6 M [[id number]]; MID AUG 2019- reduced dose to 884mday  # Poorly controlled DM-on insulin  # s/p right PCN [explanted]/ Foley cath [Dr.Brandon]; multiple UTIs. [s/p ID; Dr.Ravishankar]  ----------------------------------------------------------------- DIAGNOSIS: [ FEB 2019] MET. PROSTATE CA  STAGE:   IV ;GOALS: PALLIATIVE  CURRENT/MOST RECENT THERAPY [ Feb-April2019] Lupron+ [name]    [facility]     INTERVAL HISTORY:  GoOLLIN HOCHMUTH89.o.  male pleasant patient [name] is here for follow-up.  Patient denies any worsening aches or pains.  Appetite is fair.  No falls.  Mild fatigue not any worse.  No nausea no vomiting.  No further recent UTIs.  Review of Systems  Constitutional: Positive for malaise/fatigue. Negative for chills, diaphoresis, fever and weight loss.  HENT: Negative for nosebleeds and sore throat.   Eyes: Negative for double vision.  Respiratory: Negative for cough, hemoptysis, sputum production and wheezing.   Cardiovascular: Negative for chest pain, palpitations, orthopnea and leg swelling.  Gastrointestinal: Negative for abdominal pain, blood in stool, constipation, diarrhea, heartburn, melena, nausea and vomiting.  Musculoskeletal: Positive for back pain and joint pain.  Skin: Negative.  Negative for itching and rash.  Neurological: Negative for dizziness, tingling, focal weakness, weakness and headaches.   Endo/Heme/Allergies: Does not bruise/bleed easily.  Psychiatric/Behavioral: Negative for depression. The patient is not nervous/anxious and does not have insomnia.       PAST MEDICAL HISTORY :  Past Medical History:  Diagnosis Date  . Anemia    iron deficiency  . Benign prostatic hypertrophy   . Bilateral carotid artery disease (HCC)    Mild plaque formation without obstructive disease noted on carotid Doppler.  . Coronary artery disease    [facility] The patient was told about 2 blockages which did not require revascularization.  . Diabetes mellitus without complication ([place]  . Essential hypertension   . GERD (gastroesophageal reflux disease)   . Hyperlipidemia   . Hypertension   . Left carotid artery stenosis   . [place] ([facility]    PAST SURGICAL HISTORY :   Past Surgical History:  Procedure Laterality Date  . CARDIAC CATHETERIZATION  2010  . CARDIAC CATHETERIZATION N/A 02/03/2015   Procedure: Left Heart Cath and Coronary Angiography;  Surgeon: MuWellington HampshireMD;  [facility];  Service: Cardiovascular;  Laterality: N/A;  . CATARACT EXTRACTION Bilateral   . CYSTOSCOPY W/ RETROGRADES Bilateral 04/11/2017   Procedure: CYSTOSCOPY WITH RETROGRADE PYELOGRAM;  Surgeon: [name];  Location: [facility];  Service: Urology;  Laterality: Bilateral;  . CYSTOSCOPY W/ RETROGRADES Bilateral 09/12/2017   Procedure: CYSTOSCOPY WITH RETROGRADE PYELOGRAM;  Surgeon: [name];  Location: [facility];  Service: Urology;  Laterality: Bilateral;  . CYSTOSCOPY W/ RETROGRADES Bilateral 02/25/2018   Procedure: CYSTOSCOPY WITH RETROGRADE PYELOGRAM;  Surgeon: [name];  Location: [facility];  Service: Urology;  Laterality: Bilateral;  . CYSTOSCOPY W/ URETERAL STENT PLACEMENT Bilateral 09/12/2017   Procedure: CYSTOSCOPY WITH STENT REPLACEMENT;  Surgeon: BrHollice Espy  MD;  Location: [facility];  Service:  Urology;  Laterality: Bilateral;  . CYSTOSCOPY W/ URETERAL STENT REMOVAL Bilateral 02/25/2018   Procedure: CYSTOSCOPY WITH STENT REMOVAL;  Surgeon: [name], MD;  Location: [facility];  Service: Urology;  Laterality: Bilateral;  . CYSTOSCOPY WITH STENT PLACEMENT Left 04/11/2017   Procedure: CYSTOSCOPY WITH STENT PLACEMENT and fulgeration;  Surgeon: [name], MD;  Location: [facility];  Service: Urology;  Laterality: Left;  . CYSTOSCOPY WITH URETHRAL DILATATION Bilateral 04/11/2017   Procedure: CYSTOSCOPY WITH URETHRAL DILATATION;  Surgeon: [name], MD;  Location: [facility];  Service: Urology;  Laterality: Bilateral;  . IR CONVERT RIGHT NEPHROSTOMY TO NEPHROURETERAL CATH  05/21/2017  . IR NEPHROSTOMY PLACEMENT RIGHT  04/13/2017    FAMILY HISTORY :   Family History  Problem Relation Age of Onset  . Hypertension Father     SOCIAL HISTORY:   Social History   Tobacco Use  . Smoking status: Never Smoker  . Smokeless tobacco: Never Used  Substance Use Topics  . Alcohol use: No  . Drug use: No    ALLERGIES:  has No Known Allergies.  MEDICATIONS:  Current Outpatient Medications  Medication Sig Dispense Refill  . acetaminophen (TYLENOL) 500 MG tablet Take 500 mg by mouth every 4 (four) hours as needed for moderate pain or fever.    [name] albuterol (PROVENTIL HFA;VENTOLIN HFA) 108 (90 Base) MCG/ACT inhaler Inhale 2 puffs into the lungs every 4 (four) hours as needed for wheezing or shortness of breath.    [name] atorvastatin (LIPITOR) 10 MG tablet Take 10 mg by mouth daily at 6 PM.     . BD PEN NEEDLE MICRO U/F 32G X 6 MM MISC     . Calcium Carb-Cholecalciferol (CALCIUM 600+D3) 600-800 MG-UNIT TABS Take 1 tablet by mouth every evening.     . Calcium Carbonate-Vitamin D 600-200 MG-UNIT TABS Take by mouth.    [name] glimepiride (AMARYL) 2 MG tablet Take 2 mg by mouth daily with breakfast.    . glucose blood (ACCU-CHEK GUIDE) test strip Use asdirected three times a day diag E11.65 100 each 3   . ibuprofen (ADVIL,MOTRIN) 200 MG tablet Take 200 mg by mouth every 6 (six) hours as needed for fever or moderate pain.     [name] insulin glargine (LANTUS) 100 UNIT/ML injection Inject 6 Units into the skin every morning. Based on sugar levels in am.     . iron polysaccharides (NIFEREX) 150 MG capsule Take 150 mg by mouth daily.     . metFORMIN (GLUCOPHAGE-XR) 500 MG 24 hr tablet Take 1,000 mg by mouth at bedtime.    . metoprolol succinate (TOPROL-XL) 25 MG 24 hr tablet TAKE 1 TABLET BY MOUTH EVERY DAY FOR BLOOD PRESSURE (Patient taking differently: Take 25 mg by mouth every evening. ) 90 tablet 3  . Multiple Vitamins-Minerals (MULTIVITAMIN WITH MINERALS) tablet Take 1 tablet by mouth daily.     [name] MYRBETRIQ 25 MG TB24 tablet TAKE 1 TABLET BY MOUTH EVERY DAY 30 tablet 11  . NIFEdipine (ADALAT CC) 30 MG 24 hr tablet TAKE 1 TABLET BY MOUTH EVERY DAY 90 tablet 2  . omeprazole (PRILOSEC) 20 MG capsule TAKE ONE CAPSULE BY MOUTH EVERY DAY (Patient taking differently: Take 20 mg by mouth daily. ) 90 capsule 2  . XTANDI 40 MG capsule TAKE 2 CAPSULES (80 MG TOTAL) BY MOUTH DAILY. 60 capsule 3   No current facility-administered medications for this visit.    PHYSICAL EXAMINATION: ECOG PERFORMANCE STATUS: 1 -  Symptomatic but completely ambulatory  BP (!) 150/84 (BP Location: Left Arm, Patient Position: Sitting, Cuff Size: Normal)   Pulse 94   Temp (!) 95.4 F (35.2 C) (Tympanic)   Wt 133 lb 9.6 oz (60.6 kg)   SpO2 100%   BMI 21.56 kg/m   Filed Weights   05/09/19 0836  Weight: 133 lb 9.6 oz (60.6 kg)    Physical Exam  Constitutional: He is oriented to person, place, and time and well-developed, well-nourished, and in no distress.  Accompanied by his daughter.  Walking himself.  HENT:  Head: Normocephalic and atraumatic.  Mouth/Throat: Oropharynx is clear and moist. No oropharyngeal exudate.  Eyes: Pupils are equal, round, and reactive to light.  Cardiovascular: Normal rate and regular rhythm.   Pulmonary/Chest: Effort normal and breath sounds normal. No respiratory distress. He has no wheezes.  Abdominal: Soft. Bowel sounds are normal. He exhibits no distension and no mass. There is no abdominal tenderness. There is no rebound and no guarding.  Musculoskeletal:        General: No tenderness or edema. Normal range of motion.     Cervical back: Normal range of motion and neck supple.  Neurological: He is alert and oriented to person, place, and time.  Skin: Skin is warm.  Psychiatric: Affect normal.     LABORATORY DATA:  I have reviewed the data as listed    Component Value Date/Time   NA 133 (L) 05/09/2019 0816   NA 136 05/07/2012 1328   K 4.5 05/09/2019 0816   K 4.5 05/07/2012 1328   CL 100 05/09/2019 0816   CL 104 05/07/2012 1328   CO2 25 05/09/2019 0816   CO2 28 05/07/2012 1328   GLUCOSE 214 (H) 05/09/2019 0816   GLUCOSE 73 05/07/2012 1328   BUN 17 05/09/2019 0816   BUN 16 05/07/2012 1328   CREATININE 1.05 05/09/2019 0816   CREATININE 0.84 05/07/2012 1328   CALCIUM 9.2 05/09/2019 0816   CALCIUM 8.8 05/07/2012 1328   PROT 7.8 05/09/2019 0816   ALBUMIN 4.2 05/09/2019 0816   AST 19 05/09/2019 0816   ALT 14 05/09/2019 0816   ALKPHOS 60 05/09/2019 0816   BILITOT 0.7 05/09/2019 0816   GFRNONAA >60 05/09/2019 0816   GFRNONAA >60 05/07/2012 1328   GFRAA >60 05/09/2019 0816   GFRAA >60 05/07/2012 1328    No results found for: SPEP, UPEP  Lab Results  Component Value Date   WBC 6.5 05/09/2019   NEUTROABS 4.2 05/09/2019   HGB 12.7 (L) 05/09/2019   HCT 38.2 (L) 05/09/2019   MCV 92.3 05/09/2019   PLT 249 05/09/2019      Chemistry      Component Value Date/Time   NA 133 (L) 05/09/2019 0816   NA 136 05/07/2012 1328   K 4.5 05/09/2019 0816   K 4.5 05/07/2012 1328   CL 100 05/09/2019 0816   CL 104 05/07/2012 1328   CO2 25 05/09/2019 0816   CO2 28 05/07/2012 1328   BUN 17 05/09/2019 0816   BUN 16 05/07/2012 1328   CREATININE 1.05 05/09/2019 0816    CREATININE 0.84 05/07/2012 1328      Component Value Date/Time   CALCIUM 9.2 05/09/2019 0816   CALCIUM 8.8 05/07/2012 1328   ALKPHOS 60 05/09/2019 0816   AST 19 05/09/2019 0816   ALT 14 05/09/2019 0816   BILITOT 0.7 05/09/2019 0816       RADIOGRAPHIC STUDIES: I have personally reviewed the radiological images as listed and agreed  with the findings in the report. No results found.   ASSESSMENT & PLAN:  Prostate cancer [facility]) # Metastatic prostate cancer/ castrate sensitive [bulky retroperitoneal adenopathy; invasion into the bladder; rectum]. Eligard [90mq 649mlast 03/10/2019; On Xtandi; February 2020-CT scan shows continued significant improvement of the retroperitoneal adenopathy; bladder/rectal invasion.;  Diffuse diffuse osseous metastatic disease-STABLE.   #Continue Xtandi 80 mg a day [secondary to fatigue]; NOV 2021--STABLE/  0.1.  PSA- JAN 2021-0.08-overall stable.  Discussed with daughter;  hold off imaging at this time.  # Anemia-iron deficiency-hemoglobin 12.7- STABLE.   # DM-type II/insulin-overall improved.  # DISPOSITION:  # Follow up in 2 month-MD/labs-cbc/cmp/psa/ [[place]/friday early AM appts]Dr.B    Orders Placed This Encounter  Procedures  . CBC with Differential    Standing Status:   Future    Standing Expiration Date:   05/08/2020  . Comprehensive metabolic panel    Standing Status:   Future    Standing Expiration Date:   05/08/2020  . PSA    Standing Status:   Future    Standing Expiration Date:   05/08/2020   All questions were answered. The patient knows to call the clinic with any problems, questions or concerns.      GoCammie SickleMD 05/12/2019 8:50 AM

## 2019-05-29 ENCOUNTER — Other Ambulatory Visit: Payer: Self-pay | Admitting: Internal Medicine

## 2019-05-29 DIAGNOSIS — C61 Malignant neoplasm of prostate: Secondary | ICD-10-CM

## 2019-06-05 MED FILL — XTANDI 40 MG CAPSULE: 40 | 30 days supply | Qty: 60 | Fill #0

## 2019-06-27 ENCOUNTER — Ambulatory Visit (INDEPENDENT_AMBULATORY_CARE_PROVIDER_SITE_OTHER): Payer: Medicare Other

## 2019-06-27 ENCOUNTER — Encounter (INDEPENDENT_AMBULATORY_CARE_PROVIDER_SITE_OTHER): Payer: Self-pay | Admitting: Vascular Surgery

## 2019-06-27 ENCOUNTER — Ambulatory Visit (INDEPENDENT_AMBULATORY_CARE_PROVIDER_SITE_OTHER): Payer: Medicare Other | Admitting: Vascular Surgery

## 2019-06-27 ENCOUNTER — Other Ambulatory Visit: Payer: Self-pay

## 2019-06-27 VITALS — BP 150/81 | HR 75 | Resp 19 | Ht 66.0 in | Wt 133.0 lb

## 2019-06-27 DIAGNOSIS — I7025 Atherosclerosis of native arteries of other extremities with ulceration: Secondary | ICD-10-CM | POA: Diagnosis not present

## 2019-06-27 DIAGNOSIS — I7300 Raynaud's syndrome without gangrene: Secondary | ICD-10-CM

## 2019-06-27 DIAGNOSIS — E119 Type 2 diabetes mellitus without complications: Secondary | ICD-10-CM | POA: Diagnosis not present

## 2019-06-27 DIAGNOSIS — Z794 Long term (current) use of insulin: Secondary | ICD-10-CM

## 2019-06-27 DIAGNOSIS — E785 Hyperlipidemia, unspecified: Secondary | ICD-10-CM | POA: Diagnosis not present

## 2019-06-27 DIAGNOSIS — I10 Essential (primary) hypertension: Secondary | ICD-10-CM | POA: Diagnosis not present

## 2019-06-27 NOTE — Assessment & Plan Note (Signed)
His noninvasive studies today show completely normal triphasic waveforms and ABIs of 1.0 bilaterally as well as normal digital pressures and waveforms.  Nifedipine seems to be helping his symptoms as does the warming of the weather.  With a healed ulcer and no pain, obviously no role for angiogram or other invasive evaluation at this time.  He will continue his nifedipine and we can refill this when he needs it.  I will plan to see him back in 1 year.

## 2019-06-27 NOTE — Progress Notes (Signed)
MRN : [id number]  Michael [name] is a 86 y.o. (August 07, 1933) male who presents with chief complaint of  Chief Complaint  Patient presents with  . Follow-up  .  History of Present Illness: Patient returns today in follow up of his Raynaud's disease.  His symptoms are doing much better.  His pain is gone and his wounds have healed.  The addition of nifedipine and the warming of the weather seem to have done a tremendous job of improving his symptoms.  He has no complaints today.  He really is not having much discoloration or issues with his feet at this point.  His noninvasive studies today show completely normal triphasic waveforms and ABIs of 1.0 bilaterally as well as normal digital pressures and waveforms.  He does not have any significant arterial insufficiency due to peripheral vascular disease at this point.  Current Outpatient Medications  Medication Sig Dispense Refill  . acetaminophen (TYLENOL) 500 MG tablet Take 500 mg by mouth every 4 (four) hours as needed for moderate pain or fever.    [name] albuterol (PROVENTIL HFA;VENTOLIN HFA) 108 (90 Base) MCG/ACT inhaler Inhale 2 puffs into the lungs every 4 (four) hours as needed for wheezing or shortness of breath.    [name] atorvastatin (LIPITOR) 10 MG tablet Take 10 mg by mouth daily at 6 PM.     . BD PEN NEEDLE MICRO U/F 32G X 6 MM MISC     . Calcium Carb-Cholecalciferol (CALCIUM 600+D3) 600-800 MG-UNIT TABS Take 1 tablet by mouth every evening.     . Calcium Carbonate-Vitamin D 600-200 MG-UNIT TABS Take by mouth.    [name] glimepiride (AMARYL) 2 MG tablet Take 2 mg by mouth daily with breakfast.    . glucose blood (ACCU-CHEK GUIDE) test strip Use asdirected three times a day diag E11.65 100 each 3  . ibuprofen (ADVIL,MOTRIN) 200 MG tablet Take 200 mg by mouth every 6 (six) hours as needed for fever or moderate pain.     [name] insulin glargine (LANTUS) 100 UNIT/ML injection Inject 6 Units into the skin every morning. Based on sugar levels in am.     .  iron polysaccharides (NIFEREX) 150 MG capsule Take 150 mg by mouth daily.     . metFORMIN (GLUCOPHAGE-XR) 500 MG 24 hr tablet Take 1,000 mg by mouth at bedtime.    . metoprolol succinate (TOPROL-XL) 25 MG 24 hr tablet TAKE 1 TABLET BY MOUTH EVERY DAY FOR BLOOD PRESSURE (Patient taking differently: Take 25 mg by mouth every evening. ) 90 tablet 3  . Multiple Vitamins-Minerals (MULTIVITAMIN WITH MINERALS) tablet Take 1 tablet by mouth daily.     [name] MYRBETRIQ 25 MG TB24 tablet TAKE 1 TABLET BY MOUTH EVERY DAY 30 tablet 11  . NIFEdipine (ADALAT CC) 30 MG 24 hr tablet TAKE 1 TABLET BY MOUTH EVERY DAY 90 tablet 2  . omeprazole (PRILOSEC) 20 MG capsule TAKE ONE CAPSULE BY MOUTH EVERY DAY (Patient taking differently: Take 20 mg by mouth daily. ) 90 capsule 2  . XTANDI 40 MG capsule TAKE 2 CAPSULES (80 MG TOTAL) BY MOUTH DAILY. 60 capsule 3   No current facility-administered medications for this visit.    Past Medical History:  Diagnosis Date  . Anemia    iron deficiency  . Benign prostatic hypertrophy   . Bilateral carotid artery disease (HCC)    Mild plaque formation without obstructive disease noted on carotid Doppler.  . Coronary artery disease    Previous cardiac catheterization at  [facility] in 2010. The patient was told about 2 blockages which did not require revascularization.  . Diabetes mellitus without complication ([place])   . Essential hypertension   . GERD (gastroesophageal reflux disease)   . Hyperlipidemia   . Hypertension   . Left carotid artery stenosis   . Prostate cancer ([place]) 03/2017    Past Surgical History:  Procedure Laterality Date  . CARDIAC CATHETERIZATION  2010  . CARDIAC CATHETERIZATION N/A 02/03/2015   Procedure: Left Heart Cath and Coronary Angiography;  Surgeon: Wellington [name], MD;  Location: [facility];  Service: Cardiovascular;  Laterality: N/A;  . CATARACT EXTRACTION Bilateral   . CYSTOSCOPY W/ RETROGRADES Bilateral 04/11/2017   Procedure: CYSTOSCOPY WITH  RETROGRADE PYELOGRAM;  Surgeon: [name], MD;  Location: [facility];  Service: Urology;  Laterality: Bilateral;  . CYSTOSCOPY W/ RETROGRADES Bilateral 09/12/2017   Procedure: CYSTOSCOPY WITH RETROGRADE PYELOGRAM;  Surgeon: [name], MD;  Location: [facility];  Service: Urology;  Laterality: Bilateral;  . CYSTOSCOPY W/ RETROGRADES Bilateral 02/25/2018   Procedure: CYSTOSCOPY WITH RETROGRADE PYELOGRAM;  Surgeon: [name], MD;  Location: [facility];  Service: Urology;  Laterality: Bilateral;  . CYSTOSCOPY W/ URETERAL STENT PLACEMENT Bilateral 09/12/2017   Procedure: CYSTOSCOPY WITH STENT REPLACEMENT;  Surgeon: [name], MD;  Location: [facility];  Service: Urology;  Laterality: Bilateral;  . CYSTOSCOPY W/ URETERAL STENT REMOVAL Bilateral 02/25/2018   Procedure: CYSTOSCOPY WITH STENT REMOVAL;  Surgeon: [name], MD;  Location: [facility];  Service: Urology;  Laterality: Bilateral;  . CYSTOSCOPY WITH STENT PLACEMENT Left 04/11/2017   Procedure: CYSTOSCOPY WITH STENT PLACEMENT and fulgeration;  Surgeon: [name], MD;  Location: [facility];  Service: Urology;  Laterality: Left;  . CYSTOSCOPY WITH URETHRAL DILATATION Bilateral 04/11/2017   Procedure: CYSTOSCOPY WITH URETHRAL DILATATION;  Surgeon: [name], MD;  Location: [facility];  Service: Urology;  Laterality: Bilateral;  . IR CONVERT RIGHT NEPHROSTOMY TO NEPHROURETERAL CATH  05/21/2017  . IR NEPHROSTOMY PLACEMENT RIGHT  04/13/2017     Social History   Tobacco Use  . Smoking status: Never Smoker  . Smokeless tobacco: Never Used  Substance Use Topics  . Alcohol use: No  . Drug use: No      Family History  Problem Relation Age of Onset  . Hypertension Father   no bleeding or clotting No aneurysms  No Known Allergies    Physical Examination  BP (!) 150/81 (BP Location: Right Arm)   Pulse 75   Resp 19   Ht 5\' 6"  (1.676 m)   Wt 133 lb (60.3 kg)   BMI 21.47 kg/m  Gen:  WD/WN, NAD Head: /AT, No  temporalis wasting. Ear/Nose/Throat: Hearing grossly intact, nares w/o erythema or drainage Eyes: Conjunctiva clear. Sclera non-icteric Neck: Supple.  Trachea midline Pulmonary:  Good air movement, no use of accessory muscles.  Cardiac: RRR, no JVD Vascular:  Vessel Right Left  Radial Palpable Palpable                          PT Palpable Palpable  DP Palpable Palpable    Musculoskeletal: M/S 5/5 throughout.  No deformity or atrophy.  No edema. Neurologic: Sensation grossly intact in extremities.  Symmetrical.  Speech is fluent.  Psychiatric: Judgment intact, Mood & affect appropriate for pt's clinical situation. Dermatologic: No rashes or ulcers noted.  No cellulitis or open wounds.       Labs Recent Results (from the past 2160 hour(s))  Bladder  Scan (Post Void Residual) in office     Status: None   Collection Time: 04/22/19  3:59 PM  Result Value Ref Range   Scan Result 256ml   PSA     Status: None   Collection Time: 05/09/19  8:16 AM  Result Value Ref Range   Prostatic Specific Antigen 0.07 0.00 - 4.00 ng/mL    Comment: (NOTE) While PSA levels of <=4.0 ng/ml are reported as reference range, some men with levels below 4.0 ng/ml can have prostate cancer and many men with PSA above 4.0 ng/ml do not have prostate cancer.  Other tests such as free PSA, age specific reference ranges, PSA velocity and PSA doubling time may be helpful especially in men less than 61 years old. Performed at [facility], [place] [address] [place], [place] [address]   Comprehensive metabolic panel     Status: Abnormal   Collection Time: 05/09/19  8:16 AM  Result Value Ref Range   Sodium 133 (L) 135 - 145 mmol/L   Potassium 4.5 3.5 - 5.1 mmol/L   Chloride 100 98 - 111 mmol/L   CO2 25 22 - 32 mmol/L   Glucose, Bld 214 (H) 70 - 99 mg/dL    Comment: Glucose reference range applies only to samples taken after fasting for at least 8 hours.   BUN 17 8 - 23 mg/dL   Creatinine, Ser 1.05  0.61 - 1.24 mg/dL   Calcium 9.2 8.9 - 10.3 mg/dL   Total Protein 7.8 6.5 - 8.1 g/dL   Albumin 4.2 3.5 - 5.0 g/dL   AST 19 15 - 41 U/L   ALT 14 0 - 44 U/L   Alkaline Phosphatase 60 38 - 126 U/L   Total Bilirubin 0.7 0.3 - 1.2 mg/dL   GFR calc non Af Amer >60 >60 mL/min   GFR calc Af Amer >60 >60 mL/min   Anion gap 8 5 - 15    Comment: Performed at [facility], [address] [place], [place] [address]  CBC with Differential     Status: Abnormal   Collection Time: 05/09/19  8:16 AM  Result Value Ref Range   WBC 6.5 4.0 - 10.5 K/uL   RBC 4.14 (L) 4.22 - 5.81 MIL/uL   Hemoglobin 12.7 (L) 13.0 - 17.0 g/dL   HCT 38.2 (L) 39.0 - 52.0 %   MCV 92.3 80.0 - 100.0 fL   MCH 30.7 26.0 - 34.0 pg   MCHC 33.2 30.0 - 36.0 g/dL   RDW 12.2 11.5 - 15.5 %   Platelets 249 150 - 400 K/uL   nRBC 0.0 0.0 - 0.2 %   Neutrophils Relative % 64 %   Neutro Abs 4.2 1.7 - 7.7 K/uL   Lymphocytes Relative 24 %   Lymphs Abs 1.6 0.7 - 4.0 K/uL   Monocytes Relative 9 %   Monocytes Absolute 0.6 0.1 - 1.0 K/uL   Eosinophils Relative 2 %   Eosinophils Absolute 0.1 0.0 - 0.5 K/uL   Basophils Relative 0 %   Basophils Absolute 0.0 0.0 - 0.1 K/uL   Immature Granulocytes 1 %   Abs Immature Granulocytes 0.03 0.00 - 0.07 K/uL    Comment: Performed at [facility], [address], [place], [place] [address]    Radiology No results found.  Assessment/Plan Type 2 diabetes mellitus without complication, with long-term current use of insulin (HCC) blood glucose control important in reducing the progression of atherosclerotic disease. Also, involved in wound  healing. On appropriate medications.   Essential hypertension blood pressure control important in reducing the progression of atherosclerotic disease. On appropriate oral medications.   Hyperlipidemia lipid control important in reducing the progression of atherosclerotic disease. Continue statin therapy  Raynaud's disease without  gangrene His noninvasive studies today show completely normal triphasic waveforms and ABIs of 1.0 bilaterally as well as normal digital pressures and waveforms.  Nifedipine seems to be helping his symptoms as does the warming of the weather.  With a healed ulcer and no pain, obviously no role for angiogram or other invasive evaluation at this time.  He will continue his nifedipine and we can refill this when he needs it.  I will plan to see him back in 1 year.    Leotis Pain, MD  06/27/2019 9:46 AM    This note was created with Dragon medical transcription system.  Any errors from dictation are purely unintentional

## 2019-07-07 MED FILL — XTANDI 40 MG CAPSULE: 40 | 30 days supply | Qty: 60 | Fill #1

## 2019-07-11 ENCOUNTER — Inpatient Hospital Stay: Payer: Medicare Other | Admitting: Internal Medicine

## 2019-07-11 ENCOUNTER — Inpatient Hospital Stay: Payer: Medicare Other

## 2019-07-14 ENCOUNTER — Inpatient Hospital Stay (HOSPITAL_BASED_OUTPATIENT_CLINIC_OR_DEPARTMENT_OTHER): Payer: Medicare Other | Admitting: Internal Medicine

## 2019-07-14 ENCOUNTER — Other Ambulatory Visit: Payer: Self-pay

## 2019-07-14 ENCOUNTER — Encounter: Payer: Self-pay | Admitting: Internal Medicine

## 2019-07-14 ENCOUNTER — Inpatient Hospital Stay: Payer: Medicare Other | Attending: Internal Medicine

## 2019-07-14 DIAGNOSIS — E785 Hyperlipidemia, unspecified: Secondary | ICD-10-CM | POA: Insufficient documentation

## 2019-07-14 DIAGNOSIS — C61 Malignant neoplasm of prostate: Secondary | ICD-10-CM

## 2019-07-14 DIAGNOSIS — I2510 Atherosclerotic heart disease of native coronary artery without angina pectoris: Secondary | ICD-10-CM | POA: Diagnosis not present

## 2019-07-14 DIAGNOSIS — D509 Iron deficiency anemia, unspecified: Secondary | ICD-10-CM | POA: Diagnosis not present

## 2019-07-14 DIAGNOSIS — K219 Gastro-esophageal reflux disease without esophagitis: Secondary | ICD-10-CM | POA: Insufficient documentation

## 2019-07-14 DIAGNOSIS — Z79899 Other long term (current) drug therapy: Secondary | ICD-10-CM | POA: Diagnosis not present

## 2019-07-14 DIAGNOSIS — E1165 Type 2 diabetes mellitus with hyperglycemia: Secondary | ICD-10-CM | POA: Insufficient documentation

## 2019-07-14 DIAGNOSIS — I7025 Atherosclerosis of native arteries of other extremities with ulceration: Secondary | ICD-10-CM | POA: Diagnosis not present

## 2019-07-14 DIAGNOSIS — Z191 Hormone sensitive malignancy status: Secondary | ICD-10-CM | POA: Insufficient documentation

## 2019-07-14 DIAGNOSIS — R5383 Other fatigue: Secondary | ICD-10-CM | POA: Insufficient documentation

## 2019-07-14 DIAGNOSIS — I10 Essential (primary) hypertension: Secondary | ICD-10-CM | POA: Diagnosis not present

## 2019-07-14 LAB — CBC WITH DIFFERENTIAL/PLATELET
Abs Immature Granulocytes: 0.02 10*3/uL (ref 0.00–0.07)
Basophils Absolute: 0 10*3/uL (ref 0.0–0.1)
Basophils Relative: 1 %
Eosinophils Absolute: 0.1 10*3/uL (ref 0.0–0.5)
Eosinophils Relative: 2 %
HCT: 35 % — ABNORMAL LOW (ref 39.0–52.0)
Hemoglobin: 11.6 g/dL — ABNORMAL LOW (ref 13.0–17.0)
Immature Granulocytes: 0 %
Lymphocytes Relative: 29 %
Lymphs Abs: 1.6 10*3/uL (ref 0.7–4.0)
MCH: 30.5 pg (ref 26.0–34.0)
MCHC: 33.1 g/dL (ref 30.0–36.0)
MCV: 92.1 fL (ref 80.0–100.0)
Monocytes Absolute: 0.7 10*3/uL (ref 0.1–1.0)
Monocytes Relative: 13 %
Neutro Abs: 3.2 10*3/uL (ref 1.7–7.7)
Neutrophils Relative %: 55 %
Platelets: 213 10*3/uL (ref 150–400)
RBC: 3.8 MIL/uL — ABNORMAL LOW (ref 4.22–5.81)
RDW: 12.4 % (ref 11.5–15.5)
WBC: 5.7 10*3/uL (ref 4.0–10.5)
nRBC: 0 % (ref 0.0–0.2)

## 2019-07-14 LAB — COMPREHENSIVE METABOLIC PANEL
ALT: 14 U/L (ref 0–44)
AST: 19 U/L (ref 15–41)
Albumin: 4 g/dL (ref 3.5–5.0)
Alkaline Phosphatase: 55 U/L (ref 38–126)
Anion gap: 8 (ref 5–15)
BUN: 19 mg/dL (ref 8–23)
CO2: 25 mmol/L (ref 22–32)
Calcium: 9 mg/dL (ref 8.9–10.3)
Chloride: 102 mmol/L (ref 98–111)
Creatinine, Ser: 1.06 mg/dL (ref 0.61–1.24)
GFR calc Af Amer: >60 mL/min (ref >60)
GFR calc non Af Amer: >60 mL/min (ref >60)
Glucose, Bld: 142 mg/dL — ABNORMAL HIGH (ref 70–99)
Potassium: 4.7 mmol/L (ref 3.5–5.1)
Sodium: 135 mmol/L (ref 135–145)
Total Bilirubin: 0.6 mg/dL (ref 0.3–1.2)
Total Protein: 7.3 g/dL (ref 6.5–8.1)

## 2019-07-14 LAB — PSA: Prostatic Specific Antigen: 0.04 ng/mL (ref 0.00–4.00)

## 2019-07-14 NOTE — Progress Notes (Signed)
[place] OFFICE PROGRESS NOTE  Patient Care Team: [name], MD as PCP - General (Internal Medicine)  Cancer Staging No matching staging information was found for the patient.   Oncology History Overview Note  # FEB 2019-Metastatic prostate cancer/ castrate sensitive [bulky retroperitoneal adenopathy; invasion into the bladder; rectum] Bladder Bx- prostate. On Degarelix  # April 3rd 2019- [name] [139md];  Lupron q 6 M [[id number]]; MID AUG 2019- reduced dose to 832mday  # Poorly controlled DM-on insulin  # s/p right PCN [explanted]/ Foley cath [Dr.Brandon]; multiple UTIs. [s/p ID; Dr.Ravishankar]  ----------------------------------------------------------------- DIAGNOSIS: [ FEB 2019] MET. PROSTATE CA  STAGE:   IV ;GOALS: PALLIATIVE  CURRENT/MOST RECENT THERAPY [ Feb-April2019] Lupron+ [name]    [facility]     INTERVAL HISTORY:  GoJASTON HAVENS89.o.  male pleasant patient [name] is here for follow-up.  Denies any weight loss or nausea vomiting.  Appetite is good.  No new bone pain or joint pains.  Chronic mild fatigue not any worse.  No recent UTIs.   Review of Systems  Constitutional: Positive for malaise/fatigue. Negative for chills, diaphoresis, fever and weight loss.  HENT: Negative for nosebleeds and sore throat.   Eyes: Negative for double vision.  Respiratory: Negative for cough, hemoptysis, sputum production and wheezing.   Cardiovascular: Negative for chest pain, palpitations, orthopnea and leg swelling.  Gastrointestinal: Negative for abdominal pain, blood in stool, constipation, diarrhea, heartburn, melena, nausea and vomiting.  Musculoskeletal: Positive for back pain and joint pain.  Skin: Negative.  Negative for itching and rash.  Neurological: Negative for dizziness, tingling, focal weakness, weakness and headaches.   Endo/Heme/Allergies: Does not bruise/bleed easily.  Psychiatric/Behavioral: Negative for depression. The patient is not nervous/anxious and does not have insomnia.       PAST MEDICAL HISTORY :  Past Medical History:  Diagnosis Date  . Anemia    iron deficiency  . Benign prostatic hypertrophy   . Bilateral carotid artery disease (HCC)    Mild plaque formation without obstructive disease noted on carotid Doppler.  . Coronary artery disease    [facility] The patient was told about 2 blockages which did not require revascularization.  . Diabetes mellitus without complication ([place]  . Essential hypertension   . GERD (gastroesophageal reflux disease)   . Hyperlipidemia   . Hypertension   . Left carotid artery stenosis   . [place]    PAST SURGICAL HISTORY :   Past Surgical History:  Procedure Laterality Date  . CARDIAC CATHETERIZATION  2010  . CARDIAC CATHETERIZATION N/A 02/03/2015   Procedure: Left Heart Cath and Coronary Angiography;  Surgeon: MuWellington HampshireMD;  [facility];  Service: Cardiovascular;  Laterality: N/A;  . CATARACT EXTRACTION Bilateral   . CYSTOSCOPY W/ RETROGRADES Bilateral 04/11/2017   Procedure: CYSTOSCOPY WITH RETROGRADE PYELOGRAM;  Surgeon: [name];  Location: [facility];  Service: Urology;  Laterality: Bilateral;  . CYSTOSCOPY W/ RETROGRADES Bilateral 09/12/2017   Procedure: CYSTOSCOPY WITH RETROGRADE PYELOGRAM;  Surgeon: [name];  Location: [facility];  Service: Urology;  Laterality: Bilateral;  . CYSTOSCOPY W/ RETROGRADES Bilateral 02/25/2018   Procedure: CYSTOSCOPY WITH RETROGRADE PYELOGRAM;  Surgeon: [name];  Location: [facility];  Service: Urology;  Laterality: Bilateral;  . CYSTOSCOPY W/ URETERAL STENT PLACEMENT Bilateral 09/12/2017   Procedure: CYSTOSCOPY WITH STENT REPLACEMENT;  Surgeon: BrErlene Quan  Caryl Pina, MD;  Location: [facility];  Service:  Urology;  Laterality: Bilateral;  . CYSTOSCOPY W/ URETERAL STENT REMOVAL Bilateral 02/25/2018   Procedure: CYSTOSCOPY WITH STENT REMOVAL;  Surgeon: [name], MD;  Location: [facility];  Service: Urology;  Laterality: Bilateral;  . CYSTOSCOPY WITH STENT PLACEMENT Left 04/11/2017   Procedure: CYSTOSCOPY WITH STENT PLACEMENT and fulgeration;  Surgeon: [name], MD;  Location: [facility];  Service: Urology;  Laterality: Left;  . CYSTOSCOPY WITH URETHRAL DILATATION Bilateral 04/11/2017   Procedure: CYSTOSCOPY WITH URETHRAL DILATATION;  Surgeon: [name], MD;  Location: [facility];  Service: Urology;  Laterality: Bilateral;  . IR CONVERT RIGHT NEPHROSTOMY TO NEPHROURETERAL CATH  05/21/2017  . IR NEPHROSTOMY PLACEMENT RIGHT  04/13/2017    FAMILY HISTORY :   Family History  Problem Relation Age of Onset  . Hypertension Father     SOCIAL HISTORY:   Social History   Tobacco Use  . Smoking status: Never Smoker  . Smokeless tobacco: Never Used  Substance Use Topics  . Alcohol use: No  . Drug use: No    ALLERGIES:  has No Known Allergies.  MEDICATIONS:  Current Outpatient Medications  Medication Sig Dispense Refill  . acetaminophen (TYLENOL) 500 MG tablet Take 500 mg by mouth every 4 (four) hours as needed for moderate pain or fever.    [name] albuterol (PROVENTIL HFA;VENTOLIN HFA) 108 (90 Base) MCG/ACT inhaler Inhale 2 puffs into the lungs every 4 (four) hours as needed for wheezing or shortness of breath.    [name] atorvastatin (LIPITOR) 10 MG tablet Take 10 mg by mouth daily at 6 PM.     . BD PEN NEEDLE MICRO U/F 32G X 6 MM MISC     . Calcium Carb-Cholecalciferol (CALCIUM 600+D3) 600-800 MG-UNIT TABS Take 1 tablet by mouth every evening.     . Calcium Carbonate-Vitamin D 600-200 MG-UNIT TABS Take by mouth.    [name] glimepiride (AMARYL) 2 MG tablet Take 2 mg by mouth daily with breakfast.    . glucose blood (ACCU-CHEK GUIDE) test strip Use asdirected three times a day diag E11.65 100 each 3   . iron polysaccharides (NIFEREX) 150 MG capsule Take 150 mg by mouth daily.     . metFORMIN (GLUCOPHAGE-XR) 500 MG 24 hr tablet Take 1,000 mg by mouth in the morning and at bedtime.     . metoprolol succinate (TOPROL-XL) 25 MG 24 hr tablet TAKE 1 TABLET BY MOUTH EVERY DAY FOR BLOOD PRESSURE (Patient taking differently: Take 25 mg by mouth every evening. ) 90 tablet 3  . Multiple Vitamins-Minerals (MULTIVITAMIN WITH MINERALS) tablet Take 1 tablet by mouth daily.     [name] MYRBETRIQ 25 MG TB24 tablet TAKE 1 TABLET BY MOUTH EVERY DAY 30 tablet 11  . NIFEdipine (ADALAT CC) 30 MG 24 hr tablet TAKE 1 TABLET BY MOUTH EVERY DAY 90 tablet 2  . omeprazole (PRILOSEC) 20 MG capsule TAKE ONE CAPSULE BY MOUTH EVERY DAY (Patient taking differently: Take 20 mg by mouth daily. ) 90 capsule 2  . XTANDI 40 MG capsule TAKE 2 CAPSULES (80 MG TOTAL) BY MOUTH DAILY. 60 capsule 3  . ibuprofen (ADVIL,MOTRIN) 200 MG tablet Take 200 mg by mouth every 6 (six) hours as needed for fever or moderate pain.      No current facility-administered medications for this visit.    PHYSICAL EXAMINATION: ECOG PERFORMANCE STATUS: 1 - Symptomatic but completely ambulatory  BP 117/71   Pulse 74   Temp (!) 96.2 F (35.7  C) (Tympanic)   Resp 20   Ht 5' 6" (1.676 m)   Wt 133 lb (60.3 kg)   BMI 21.47 kg/m   Filed Weights   07/14/19 1359  Weight: 133 lb (60.3 kg)    Physical Exam  Constitutional: He is oriented to person, place, and time and well-developed, well-nourished, and in no distress.  Accompanied by his daughter.  Walking himself.  HENT:  Head: Normocephalic and atraumatic.  Mouth/Throat: Oropharynx is clear and moist. No oropharyngeal exudate.  Eyes: Pupils are equal, round, and reactive to light.  Cardiovascular: Normal rate and regular rhythm.  Pulmonary/Chest: Effort normal and breath sounds normal. No respiratory distress. He has no wheezes.  Abdominal: Soft. Bowel sounds are normal. He exhibits no distension and  no mass. There is no abdominal tenderness. There is no rebound and no guarding.  Musculoskeletal:        General: No tenderness or edema. Normal range of motion.     Cervical back: Normal range of motion and neck supple.  Neurological: He is alert and oriented to person, place, and time.  Skin: Skin is warm.  Psychiatric: Affect normal.     LABORATORY DATA:  I have reviewed the data as listed    Component Value Date/Time   NA 135 07/14/2019 1314   NA 136 05/07/2012 1328   K 4.7 07/14/2019 1314   K 4.5 05/07/2012 1328   CL 102 07/14/2019 1314   CL 104 05/07/2012 1328   CO2 25 07/14/2019 1314   CO2 28 05/07/2012 1328   GLUCOSE 142 (H) 07/14/2019 1314   GLUCOSE 73 05/07/2012 1328   BUN 19 07/14/2019 1314   BUN 16 05/07/2012 1328   CREATININE 1.06 07/14/2019 1314   CREATININE 0.84 05/07/2012 1328   CALCIUM 9.0 07/14/2019 1314   CALCIUM 8.8 05/07/2012 1328   PROT 7.3 07/14/2019 1314   ALBUMIN 4.0 07/14/2019 1314   AST 19 07/14/2019 1314   ALT 14 07/14/2019 1314   ALKPHOS 55 07/14/2019 1314   BILITOT 0.6 07/14/2019 1314   GFRNONAA >60 07/14/2019 1314   GFRNONAA >60 05/07/2012 1328   GFRAA >60 07/14/2019 1314   GFRAA >60 05/07/2012 1328    No results found for: SPEP, UPEP  Lab Results  Component Value Date   WBC 5.7 07/14/2019   NEUTROABS 3.2 07/14/2019   HGB 11.6 (L) 07/14/2019   HCT 35.0 (L) 07/14/2019   MCV 92.1 07/14/2019   PLT 213 07/14/2019      Chemistry      Component Value Date/Time   NA 135 07/14/2019 1314   NA 136 05/07/2012 1328   K 4.7 07/14/2019 1314   K 4.5 05/07/2012 1328   CL 102 07/14/2019 1314   CL 104 05/07/2012 1328   CO2 25 07/14/2019 1314   CO2 28 05/07/2012 1328   BUN 19 07/14/2019 1314   BUN 16 05/07/2012 1328   CREATININE 1.06 07/14/2019 1314   CREATININE 0.84 05/07/2012 1328      Component Value Date/Time   CALCIUM 9.0 07/14/2019 1314   CALCIUM 8.8 05/07/2012 1328   ALKPHOS 55 07/14/2019 1314   AST 19 07/14/2019 1314   ALT  14 07/14/2019 1314   BILITOT 0.6 07/14/2019 1314       RADIOGRAPHIC STUDIES: I have personally reviewed the radiological images as listed and agreed with the findings in the report. No results found.   ASSESSMENT & PLAN:  Prostate cancer [facility]) # Metastatic prostate cancer/ castrate sensitive [bulky retroperitoneal adenopathy; invasion  into the bladder; rectum]. Eligard [71mq 659mlast 03/10/2019; On Xtandi; February 2020-CT scan shows continued significant improvement of the retroperitoneal adenopathy; bladder/rectal invasion.;  Diffuse diffuse osseous metastatic disease-stable.  #Continue Xtandi 80 mg a day [secondary to fatigue]; NOV 2021--STABLE/  0.1.  PSA- march 2021-0.07-overall stable.  Hold off imaging at this time as patient is doing well.   # Anemia-iron deficiency-hemoglobin 11-12- STABLE; on PO iorn.   # DM-type II/insulin-overall improved.  # DISPOSITION:  # Follow up in 2 month-MD/labs-cbc/cmp/psa/ELIGARD [[place]/friday early AM appts]Dr.B    Orders Placed This Encounter  Procedures  . CBC with Differential    Standing Status:   Future    Standing Expiration Date:   07/13/2020  . Comprehensive metabolic panel    Standing Status:   Future    Standing Expiration Date:   07/13/2020  . PSA    Standing Status:   Future    Standing Expiration Date:   07/13/2020   All questions were answered. The patient knows to call the clinic with any problems, questions or concerns.      GoCammie SickleMD 07/15/2019 8:17 AM

## 2019-07-14 NOTE — Assessment & Plan Note (Addendum)
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 Eligard [69m q 68m; last 03/10/2019; On Xtandi; February 2020-CT scan shows continued significant improvement of the retroperitoneal adenopathy; bladder/rectal invasion.;  Diffuse diffuse osseous metastatic disease-stable.  #Continue Xtandi 80 mg a day [secondary to fatigue]; NOV 2021--STABLE/  0.1.  PSA- march 2021-0.07-overall stable.  Hold off imaging at this time as patient is doing well.   # Anemia-iron deficiency-hemoglobin 11-12- STABLE; on PO iorn.   # DM-type II/insulin-overall improved.  # DISPOSITION:  # Follow up in 2 month-MD/labs-cbc/cmp/psa/ELIGARD [[place]/friday early AM appts]Dr.B

## 2019-07-25 ENCOUNTER — Ambulatory Visit (INDEPENDENT_AMBULATORY_CARE_PROVIDER_SITE_OTHER): Payer: Medicare Other | Admitting: Nurse Practitioner

## 2019-07-25 ENCOUNTER — Other Ambulatory Visit: Payer: Self-pay

## 2019-07-25 ENCOUNTER — Encounter: Payer: Self-pay | Admitting: Nurse Practitioner

## 2019-07-25 VITALS — BP 138/82 | HR 86 | Ht 66.0 in | Wt 131.5 lb

## 2019-07-25 DIAGNOSIS — I2510 Atherosclerotic heart disease of native coronary artery without angina pectoris: Secondary | ICD-10-CM

## 2019-07-25 DIAGNOSIS — I7025 Atherosclerosis of native arteries of other extremities with ulceration: Secondary | ICD-10-CM | POA: Diagnosis not present

## 2019-07-25 DIAGNOSIS — E785 Hyperlipidemia, unspecified: Secondary | ICD-10-CM | POA: Diagnosis not present

## 2019-07-25 DIAGNOSIS — I10 Essential (primary) hypertension: Secondary | ICD-10-CM | POA: Diagnosis not present

## 2019-07-25 NOTE — Patient Instructions (Signed)
Medication Instructions:  Your physician recommends that you continue on your current medications as directed. Please refer to the Current Medication list given to you today.  *If you need a refill on your cardiac medications before your next appointment, please call your pharmacy*   Lab Work: None ordered If you have labs (blood work) drawn today and your tests are completely normal, you will receive your results only by: . MyChart Message (if you have MyChart) OR . A paper copy in the mail If you have any lab test that is abnormal or we need to change your treatment, we will call you to review the results.   Testing/Procedures: None ordered   Follow-Up: At [facility], you and your health needs are our priority.  As part of our continuing mission to provide you with exceptional heart care, we have created designated Provider Care Teams.  These Care Teams include your primary Cardiologist (physician) and Advanced Practice Providers (APPs -  Physician Assistants and Nurse Practitioners) who all work together to provide you with the care you need, when you need it.  We recommend signing up for the patient portal called "MyChart".  Sign up information is provided on this After Visit Summary.  MyChart is used to connect with patients for Virtual Visits (Telemedicine).  Patients are able to view lab/test results, encounter notes, upcoming appointments, etc.  Non-urgent messages can be sent to your provider as well.   To learn more about what you can do with MyChart, go to [email or web address].    Your next appointment:   6 month(s)  The format for your next appointment:   In Person  Provider:    You may see Muhammad Arida, MD or Christopher Berge, NP 

## 2019-07-25 NOTE — Progress Notes (Signed)
Office Visit    Patient Name: Michael [name] Date of Encounter: 07/25/2019  Primary Care Provider:  [name], MD Primary Cardiologist:  [name], MD  Chief Complaint    86 year old male with a history of nonobstructive CAD, type 2 diabetes mellitus, hypertension, hyperlipidemia, and carotid arterial disease, who presents for follow-up dyspnea.  Past Medical History    Past Medical History:  Diagnosis Date  . Anemia    iron deficiency  . Aortic atherosclerosis ([place])   . Bilateral carotid artery disease (HCC)    Mild plaque formation without obstructive disease noted on carotid Doppler.  . Diabetes mellitus without complication ([place])   . Essential hypertension   . GERD (gastroesophageal reflux disease)   . Hyperlipidemia   . Hypertension   . Left carotid artery stenosis   . Nonobstructive Coronary artery disease    a. Previous cardiac catheterization at [facility] in 2010. The patient was told about 2 blockages which did not require revascularization; b. 01/2015 Cath: LM nl, LAD 10p/m, 41m/d, D2 30ost, LCX nl, RCA min irregs, EF 55-65%; b. 12/2018 MV: No ischemia/infarct. CT images w/ mod 3 vessel Cor Ca2+ and mild-mod Ao atherosclerosis.  . Prostate cancer ([place]) 03/2017  . Raynaud disease    a. Managed w/ nifedipine.   Past Surgical History:  Procedure Laterality Date  . CARDIAC CATHETERIZATION  2010  . CARDIAC CATHETERIZATION N/A 02/03/2015   Procedure: Left Heart Cath and Coronary Angiography;  Surgeon: Wellington [name], MD;  Location: [place] [facility];  Service: Cardiovascular;  Laterality: N/A;  . CATARACT EXTRACTION Bilateral   . CYSTOSCOPY W/ RETROGRADES Bilateral 04/11/2017   Procedure: CYSTOSCOPY WITH RETROGRADE PYELOGRAM;  Surgeon: [name], MD;  Location: [facility];  Service: Urology;  Laterality: Bilateral;  . CYSTOSCOPY W/ RETROGRADES Bilateral 09/12/2017   Procedure: CYSTOSCOPY WITH RETROGRADE PYELOGRAM;  Surgeon: [name], MD;   Location: [facility];  Service: Urology;  Laterality: Bilateral;  . CYSTOSCOPY W/ RETROGRADES Bilateral 02/25/2018   Procedure: CYSTOSCOPY WITH RETROGRADE PYELOGRAM;  Surgeon: [name], MD;  Location: [facility];  Service: Urology;  Laterality: Bilateral;  . CYSTOSCOPY W/ URETERAL STENT PLACEMENT Bilateral 09/12/2017   Procedure: CYSTOSCOPY WITH STENT REPLACEMENT;  Surgeon: [name], MD;  Location: [facility];  Service: Urology;  Laterality: Bilateral;  . CYSTOSCOPY W/ URETERAL STENT REMOVAL Bilateral 02/25/2018   Procedure: CYSTOSCOPY WITH STENT REMOVAL;  Surgeon: [name], MD;  Location: [facility];  Service: Urology;  Laterality: Bilateral;  . CYSTOSCOPY WITH STENT PLACEMENT Left 04/11/2017   Procedure: CYSTOSCOPY WITH STENT PLACEMENT and fulgeration;  Surgeon: [name], MD;  Location: [facility];  Service: Urology;  Laterality: Left;  . CYSTOSCOPY WITH URETHRAL DILATATION Bilateral 04/11/2017   Procedure: CYSTOSCOPY WITH URETHRAL DILATATION;  Surgeon: [name], MD;  Location: [facility];  Service: Urology;  Laterality: Bilateral;  . IR CONVERT RIGHT NEPHROSTOMY TO NEPHROURETERAL CATH  05/21/2017  . IR NEPHROSTOMY PLACEMENT RIGHT  04/13/2017    Allergies  No Known Allergies  History of Present Illness    86 year old male with above past medical history including nonobstructive coronary artery disease, type 2 diabetes mellitus, hypertension, hyperlipidemia, carotid arterial disease, and Raynaud's disease.  He previously underwent diagnostic catheterization in December 2016 showing moderate, nonobstructive LAD and diagonal disease with normal LV function.  At his last office visit in November 2020, he reported exertional dyspnea and underwent stress testing which was low risk without evidence of ischemia or infarct.  Since his last visit, he has  done well from a cardiac standpoint.  He has been taking nifedipine for Raynaud's disease and has noted improvement in foot pain and  reduced skin breakdown.  He has been walking some and has noted improvement in previously reported dyspnea on exertion.  He does still experience dyspnea when walking up 10-15 steps but otherwise does fine.  He denies chest pain, palpitations, PND, orthopnea, dizziness, syncope, edema, or early satiety.  He does monitor his blood pressure at home and this typically runs from the 1 teens to 130s.  Home Medications    Prior to Admission medications   Medication Sig Start Date End Date Taking? Authorizing Provider  acetaminophen (TYLENOL) 500 MG tablet Take 500 mg by mouth every 4 (four) hours as needed for moderate pain or fever.    [provider]  albuterol (PROVENTIL HFA;VENTOLIN HFA) 108 (90 Base) MCG/ACT inhaler Inhale 2 puffs into the lungs every 4 (four) hours as needed for wheezing or shortness of breath.    [provider]  atorvastatin (LIPITOR) 10 MG tablet Take 10 mg by mouth daily at 6 PM.     [provider]  BD PEN NEEDLE MICRO U/F 32G X 6 MM MISC  02/08/18   [provider]  Calcium Carb-Cholecalciferol (CALCIUM 600+D3) 600-800 MG-UNIT TABS Take 1 tablet by mouth every evening.     [provider]  Calcium Carbonate-Vitamin D 600-200 MG-UNIT TABS Take by mouth. 03/22/12   [provider]  glimepiride (AMARYL) 2 MG tablet Take 2 mg by mouth daily with breakfast.    [provider]  glucose blood (ACCU-CHEK GUIDE) test strip Use asdirected three times a day diag E11.65 04/19/17   [name], MD  ibuprofen (ADVIL,MOTRIN) 200 MG tablet Take 200 mg by mouth every 6 (six) hours as needed for fever or moderate pain.     [provider]  iron polysaccharides (NIFEREX) 150 MG capsule Take 150 mg by mouth daily.  12/28/17 01/03/23  [provider]  metFORMIN (GLUCOPHAGE-XR) 500 MG 24 hr tablet Take 1,000 mg by mouth in the morning and at bedtime.  01/28/19   [provider]  metoprolol succinate (TOPROL-XL)  25 MG 24 hr tablet TAKE 1 TABLET BY MOUTH EVERY DAY FOR BLOOD PRESSURE Patient taking differently: Take 25 mg by mouth every evening.  11/09/17   [name], MD  Multiple Vitamins-Minerals (MULTIVITAMIN WITH MINERALS) tablet Take 1 tablet by mouth daily.     [provider]  MYRBETRIQ 25 MG TB24 tablet TAKE 1 TABLET BY MOUTH EVERY DAY 11/05/18   [name], MD  NIFEdipine (ADALAT CC) 30 MG 24 hr tablet TAKE 1 TABLET BY MOUTH EVERY DAY 05/06/19   Kris Hartmann, NP  omeprazole (PRILOSEC) 20 MG capsule TAKE ONE CAPSULE BY MOUTH EVERY DAY Patient taking differently: Take 20 mg by mouth daily.  07/02/17   [name], MD  XTANDI 40 MG capsule TAKE 2 CAPSULES (80 MG TOTAL) BY MOUTH DAILY. 05/29/19   [name], MD    Review of Systems    Some dyspnea on exertion when walking up inclines/stairs but otherwise improved since November.  Improved foot pain with warming of weather and nifedipine therapy.  He denies chest pain, palpitations, PND, orthopnea, dizziness, syncope, edema, or early satiety.  All other systems reviewed and are otherwise negative except as noted above.  Physical Exam    VS:  BP 138/82 (BP Location: Left Arm, Patient Position: Sitting, Cuff Size: Normal)  Pulse 86   Ht 5\' 6"  (1.676 m)   Wt 131 lb 8 oz (59.6 kg)   SpO2 97%   BMI 21.22 kg/m  , BMI Body mass index is 21.22 kg/m. GEN: Well nourished, well developed, in no acute distress. HEENT: normal. Neck: Supple, no JVD, carotid bruits, or masses. Cardiac: RRR, no murmurs, rubs, or gallops. No clubbing, cyanosis, edema.  Radials/PT 1+ and equal bilaterally.  Respiratory:  Respirations regular and unlabored, clear to auscultation bilaterally. GI: Soft, nontender, nondistended, BS + x 4. MS: no deformity or atrophy. Skin: warm and dry, no rash. Neuro:  Strength and sensation are intact. Psych: Normal affect.  Accessory Clinical Findings    ECG personally reviewed by me today -regular sinus  rhythm, 86, nonspecific ST/T changes - no acute changes.  Lab Results  Component Value Date   WBC 5.7 07/14/2019   HGB 11.6 (L) 07/14/2019   HCT 35.0 (L) 07/14/2019   MCV 92.1 07/14/2019   PLT 213 07/14/2019   Lab Results  Component Value Date   CREATININE 1.06 07/14/2019   BUN 19 07/14/2019   NA 135 07/14/2019   K 4.7 07/14/2019   CL 102 07/14/2019   CO2 25 07/14/2019   Lab Results  Component Value Date   ALT 14 07/14/2019   AST 19 07/14/2019   ALKPHOS 55 07/14/2019   BILITOT 0.6 07/14/2019    Lab Results  Component Value Date   HGBA1C 9.3 04/05/2017   Jul 07, 2019: Total cholesterol 125, triglycerides 132, HDL 47.9, LDL 51.  Assessment & Plan    1.  Nonobstructive coronary artery disease: Previous cath in December 2016 showing moderate, nonobstructive LAD and diagonal disease with normal LV function.  He had a negative Myoview in November 2020 in the setting of increasing dyspnea.  Exercise tolerance has improved since then, though he still experiences dyspnea exertion with inclines.  He has not had any chest pain.  Overall, he feels well.  He remains on statin, beta-blocker therapy.  2.  Hyperlipidemia:   Remains on atorvastatin therapy with an LDL of 51 in May of this year.    3.  Essential hypertension: Blood pressure trending in the 1 teens to 130s at home.  Continue current regimen.  4.  Type 2 diabetes mellitus: Followed by primary care.  A1c was 7.5 in April 2021, down from 9.1 in January.  5.  Metastatic prostate cancer: Followed closely by oncology.  6.  Disposition: Follow-up in 6 months or sooner if necessary.   Murray Hodgkins, NP 07/25/2019, 8:17 AM

## 2019-08-06 MED FILL — XTANDI 40 MG CAPSULE: 40 | 30 days supply | Qty: 60 | Fill #2

## 2019-09-04 MED FILL — XTANDI 40 MG CAPSULE: 40 | 30 days supply | Qty: 60 | Fill #3

## 2019-09-15 ENCOUNTER — Encounter: Payer: Self-pay | Admitting: Internal Medicine

## 2019-09-15 ENCOUNTER — Other Ambulatory Visit: Payer: Self-pay

## 2019-09-15 ENCOUNTER — Inpatient Hospital Stay: Payer: Medicare Other

## 2019-09-15 ENCOUNTER — Inpatient Hospital Stay: Payer: Medicare Other | Attending: Internal Medicine

## 2019-09-15 ENCOUNTER — Inpatient Hospital Stay (HOSPITAL_BASED_OUTPATIENT_CLINIC_OR_DEPARTMENT_OTHER): Payer: Medicare Other | Admitting: Internal Medicine

## 2019-09-15 VITALS — BP 127/75 | HR 79 | Temp 96.9°F | Resp 20 | Wt 129.8 lb

## 2019-09-15 DIAGNOSIS — Z8249 Family history of ischemic heart disease and other diseases of the circulatory system: Secondary | ICD-10-CM | POA: Diagnosis not present

## 2019-09-15 DIAGNOSIS — C7951 Secondary malignant neoplasm of bone: Secondary | ICD-10-CM | POA: Insufficient documentation

## 2019-09-15 DIAGNOSIS — C61 Malignant neoplasm of prostate: Secondary | ICD-10-CM

## 2019-09-15 DIAGNOSIS — Z794 Long term (current) use of insulin: Secondary | ICD-10-CM | POA: Diagnosis not present

## 2019-09-15 DIAGNOSIS — I10 Essential (primary) hypertension: Secondary | ICD-10-CM | POA: Diagnosis not present

## 2019-09-15 DIAGNOSIS — I2510 Atherosclerotic heart disease of native coronary artery without angina pectoris: Secondary | ICD-10-CM | POA: Diagnosis not present

## 2019-09-15 DIAGNOSIS — K219 Gastro-esophageal reflux disease without esophagitis: Secondary | ICD-10-CM | POA: Insufficient documentation

## 2019-09-15 DIAGNOSIS — D509 Iron deficiency anemia, unspecified: Secondary | ICD-10-CM | POA: Diagnosis not present

## 2019-09-15 DIAGNOSIS — E119 Type 2 diabetes mellitus without complications: Secondary | ICD-10-CM | POA: Diagnosis not present

## 2019-09-15 DIAGNOSIS — I7025 Atherosclerosis of native arteries of other extremities with ulceration: Secondary | ICD-10-CM

## 2019-09-15 DIAGNOSIS — Z791 Long term (current) use of non-steroidal anti-inflammatories (NSAID): Secondary | ICD-10-CM | POA: Diagnosis not present

## 2019-09-15 DIAGNOSIS — Z79899 Other long term (current) drug therapy: Secondary | ICD-10-CM | POA: Diagnosis not present

## 2019-09-15 DIAGNOSIS — E785 Hyperlipidemia, unspecified: Secondary | ICD-10-CM | POA: Diagnosis not present

## 2019-09-15 LAB — CBC WITH DIFFERENTIAL/PLATELET
Abs Immature Granulocytes: 0.03 10*3/uL (ref 0.00–0.07)
Basophils Absolute: 0 10*3/uL (ref 0.0–0.1)
Basophils Relative: 1 %
Eosinophils Absolute: 0.1 10*3/uL (ref 0.0–0.5)
Eosinophils Relative: 2 %
HCT: 36.1 % — ABNORMAL LOW (ref 39.0–52.0)
Hemoglobin: 12.1 g/dL — ABNORMAL LOW (ref 13.0–17.0)
Immature Granulocytes: 1 %
Lymphocytes Relative: 27 %
Lymphs Abs: 1.7 10*3/uL (ref 0.7–4.0)
MCH: 31.2 pg (ref 26.0–34.0)
MCHC: 33.5 g/dL (ref 30.0–36.0)
MCV: 93 fL (ref 80.0–100.0)
Monocytes Absolute: 0.7 10*3/uL (ref 0.1–1.0)
Monocytes Relative: 11 %
Neutro Abs: 3.7 10*3/uL (ref 1.7–7.7)
Neutrophils Relative %: 58 %
Platelets: 238 10*3/uL (ref 150–400)
RBC: 3.88 MIL/uL — ABNORMAL LOW (ref 4.22–5.81)
RDW: 12.7 % (ref 11.5–15.5)
WBC: 6.2 10*3/uL (ref 4.0–10.5)
nRBC: 0 % (ref 0.0–0.2)

## 2019-09-15 LAB — COMPREHENSIVE METABOLIC PANEL
ALT: 14 U/L (ref 0–44)
AST: 22 U/L (ref 15–41)
Albumin: 4.1 g/dL (ref 3.5–5.0)
Alkaline Phosphatase: 58 U/L (ref 38–126)
Anion gap: 6 (ref 5–15)
BUN: 21 mg/dL (ref 8–23)
CO2: 28 mmol/L (ref 22–32)
Calcium: 9.2 mg/dL (ref 8.9–10.3)
Chloride: 101 mmol/L (ref 98–111)
Creatinine, Ser: 1.11 mg/dL (ref 0.61–1.24)
GFR calc Af Amer: >60 mL/min (ref >60)
GFR calc non Af Amer: >60 mL/min (ref >60)
Glucose, Bld: 132 mg/dL — ABNORMAL HIGH (ref 70–99)
Potassium: 4.5 mmol/L (ref 3.5–5.1)
Sodium: 135 mmol/L (ref 135–145)
Total Bilirubin: 0.7 mg/dL (ref 0.3–1.2)
Total Protein: 7.9 g/dL (ref 6.5–8.1)

## 2019-09-15 LAB — PSA: Prostatic Specific Antigen: 0.06 ng/mL (ref 0.00–4.00)

## 2019-09-15 MED ORDER — LEUPROLIDE ACETATE (6 MONTH) 45 MG ~~LOC~~ KIT
45.0000 mg | PACK | Freq: Once | SUBCUTANEOUS | Status: AC
  Administered 2019-09-15: 45 mg via SUBCUTANEOUS
  Filled 2019-09-15: qty 45

## 2019-09-15 NOTE — Assessment & Plan Note (Signed)
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 Eligard [58m q 73m; last 03/10/2019; On Xtandi; February 2020-CT scan shows continued significant improvement of the retroperitoneal adenopathy; bladder/rectal invasion.;  Diffuse diffuse osseous metastatic disease-STABLE.   #Continue Xtandi 80 mg a day [secondary to fatigue]; NOV 2021--STABLE/  0.1.  PSA-July  2021-0.03- STABLE;   Hold off imaging at this time.   # Anemia-iron deficiency-hemoglobin 11-12- STABLE;  on PO iorn.   # DM-type II/insulin-STABLE; BG-132- post BF.   # DISPOSITION:  # Eligard today.  # Follow up in 2 month-MD/labs-cbc/cmp/psa [[place]/friday early AM appts]Dr.B

## 2019-09-15 NOTE — Progress Notes (Signed)
[name] OFFICE PROGRESS NOTE  Patient Care Team: [name], MD as PCP - General (Internal Medicine) Wellington [name], MD as PCP - Cardiology (Cardiology)  Cancer Staging No matching staging information was found for the patient.   Oncology History Overview Note  # FEB 2019-Metastatic prostate cancer/ castrate sensitive [bulky retroperitoneal adenopathy; invasion into the bladder; rectum] Bladder Bx- prostate. On Degarelix  # April 3rd 2019- [name] [172md];  Lupron q 6 M [[id number]]; MID AUG 2019- reduced dose to 875mday  # Poorly controlled DM-on insulin  # s/p right PCN [explanted]/ Foley cath [Dr.Brandon]; multiple UTIs. [s/p ID; Dr.Ravishankar]  ----------------------------------------------------------------- DIAGNOSIS: [ FEB 2019] MET. PROSTATE CA  STAGE:   IV ;GOALS: PALLIATIVE  CURRENT/MOST RECENT THERAPY [ Feb-April2019] Lupron+ [name]    [facility]     INTERVAL HISTORY:  GoRACHIT GRIM89.o.  male pleasant patient [name] is here for follow-up.  Appetite is good.  No weight loss.  No nausea no vomiting.  Mild fatigue not any worse.  No recent UTIs.  No difficulty with urination.  Review of Systems  Constitutional: Positive for malaise/fatigue. Negative for chills, diaphoresis, fever and weight loss.  HENT: Negative for nosebleeds and sore throat.   Eyes: Negative for double vision.  Respiratory: Negative for cough, hemoptysis, sputum production and wheezing.   Cardiovascular: Negative for chest pain, palpitations, orthopnea and leg swelling.  Gastrointestinal: Negative for abdominal pain, blood in stool, constipation, diarrhea, heartburn, melena, nausea and vomiting.  Musculoskeletal: Positive for back pain and joint pain.  Skin: Negative.  Negative for itching and rash.  Neurological: Negative for dizziness, tingling, focal weakness,  weakness and headaches.  Endo/Heme/Allergies: Does not bruise/bleed easily.  Psychiatric/Behavioral: Negative for depression. The patient is not nervous/anxious and does not have insomnia.       PAST MEDICAL HISTORY :  Past Medical History:  Diagnosis Date  . Anemia    iron deficiency  . Aortic atherosclerosis ([facility]  . Bilateral carotid artery disease (HCC)    Mild plaque formation without obstructive disease noted on carotid Doppler.  . Diabetes mellitus without complication ([facility]  . Essential hypertension   . GERD (gastroesophageal reflux disease)   . Hyperlipidemia   . Hypertension   . Left carotid artery stenosis   . Nonobstructive Coronary artery disease    a. [facility] The patient was told about 2 blockages which did not require revascularization; b. 01/2015 Cath: LM nl, LAD 10p/m, 4572m D2 30ost, LCX nl, RCA min irregs, EF 55-65%; b. 12/2018 MV: No ischemia/infarct. CT images w/ mod 3 vessel Cor Ca2+ and mild-mod Ao atherosclerosis.  . Prostate cancer ([facility]CC[facility]2/2019  . Raynaud disease    a. Managed w/ nifedipine.    PAST SURGICAL HISTORY :   Past Surgical History:  Procedure Laterality Date  . CARDIAC CATHETERIZATION  2010  . CARDIAC CATHETERIZATION N/A 02/03/2015   Procedure: Left Heart Cath and Coronary Angiography;  Surgeon: MuhWellington HampshireD;  Location: [facility];  Service: Cardiovascular;  Laterality: N/A;  . CATARACT EXTRACTION Bilateral   . CYSTOSCOPY W/ RETROGRADES Bilateral 04/11/2017   Procedure: CYSTOSCOPY WITH RETROGRADE PYELOGRAM;  Surgeon: BraHollice EspyD;  Location: [facility];  Service: Urology;  Laterality: Bilateral;  . CYSTOSCOPY W/ RETROGRADES Bilateral 09/12/2017   Procedure: CYSTOSCOPY WITH RETROGRADE PYELOGRAM;  Surgeon: BraHollice EspyD;  Location: [facility];  Service:  Urology;  Laterality: Bilateral;  . CYSTOSCOPY W/ RETROGRADES Bilateral 02/25/2018   Procedure: CYSTOSCOPY WITH RETROGRADE  PYELOGRAM;  Surgeon: [name], MD;  Location: [facility];  Service: Urology;  Laterality: Bilateral;  . CYSTOSCOPY W/ URETERAL STENT PLACEMENT Bilateral 09/12/2017   Procedure: CYSTOSCOPY WITH STENT REPLACEMENT;  Surgeon: [name], MD;  Location: [facility];  Service: Urology;  Laterality: Bilateral;  . CYSTOSCOPY W/ URETERAL STENT REMOVAL Bilateral 02/25/2018   Procedure: CYSTOSCOPY WITH STENT REMOVAL;  Surgeon: [name], MD;  Location: [facility];  Service: Urology;  Laterality: Bilateral;  . CYSTOSCOPY WITH STENT PLACEMENT Left 04/11/2017   Procedure: CYSTOSCOPY WITH STENT PLACEMENT and fulgeration;  Surgeon: [name], MD;  Location: [facility];  Service: Urology;  Laterality: Left;  . CYSTOSCOPY WITH URETHRAL DILATATION Bilateral 04/11/2017   Procedure: CYSTOSCOPY WITH URETHRAL DILATATION;  Surgeon: [name], MD;  Location: [facility];  Service: Urology;  Laterality: Bilateral;  . IR CONVERT RIGHT NEPHROSTOMY TO NEPHROURETERAL CATH  05/21/2017  . IR NEPHROSTOMY PLACEMENT RIGHT  04/13/2017    FAMILY HISTORY :   Family History  Problem Relation Age of Onset  . Hypertension Father     SOCIAL HISTORY:   Social History   Tobacco Use  . Smoking status: Never Smoker  . Smokeless tobacco: Never Used  Vaping Use  . Vaping Use: Never used  Substance Use Topics  . Alcohol use: No  . Drug use: No    ALLERGIES:  has No Known Allergies.  MEDICATIONS:  Current Outpatient Medications  Medication Sig Dispense Refill  . acetaminophen (TYLENOL) 500 MG tablet Take 500 mg by mouth every 4 (four) hours as needed for moderate pain or fever.    [name] albuterol (PROVENTIL HFA;VENTOLIN HFA) 108 (90 Base) MCG/ACT inhaler Inhale 2 puffs into the lungs every 4 (four) hours as needed for wheezing or shortness of breath.    [name] atorvastatin (LIPITOR) 10 MG tablet Take 10 mg by mouth daily at 6 PM.     . Calcium Carb-Cholecalciferol (CALCIUM 600+D3) 600-800 MG-UNIT TABS Take 1 tablet by mouth  every evening.     . Calcium Carbonate-Vitamin D 600-200 MG-UNIT TABS Take by mouth.    [name] glimepiride (AMARYL) 2 MG tablet Take 2 mg by mouth daily with breakfast.    . glucose blood (ACCU-CHEK GUIDE) test strip Use asdirected three times a day diag E11.65 100 each 3  . ibuprofen (ADVIL,MOTRIN) 200 MG tablet Take 200 mg by mouth every 6 (six) hours as needed for fever or moderate pain.     . iron polysaccharides (NIFEREX) 150 MG capsule Take 150 mg by mouth daily.     . metFORMIN (GLUCOPHAGE-XR) 500 MG 24 hr tablet Take 1,000 mg by mouth in the morning and at bedtime.     . metoprolol succinate (TOPROL-XL) 25 MG 24 hr tablet TAKE 1 TABLET BY MOUTH EVERY DAY FOR BLOOD PRESSURE (Patient taking differently: Take 25 mg by mouth every evening. ) 90 tablet 3  . Multiple Vitamins-Minerals (MULTIVITAMIN WITH MINERALS) tablet Take 1 tablet by mouth daily.     [name] MYRBETRIQ 25 MG TB24 tablet TAKE 1 TABLET BY MOUTH EVERY DAY 30 tablet 11  . NIFEdipine (ADALAT CC) 30 MG 24 hr tablet TAKE 1 TABLET BY MOUTH EVERY DAY 90 tablet 2  . omeprazole (PRILOSEC) 20 MG capsule TAKE ONE CAPSULE BY MOUTH EVERY DAY (Patient taking differently: Take 20 mg by mouth daily. ) 90 capsule 2  . XTANDI 40 MG capsule TAKE 2 CAPSULES (  80 MG TOTAL) BY MOUTH DAILY. 60 capsule 3   No current facility-administered medications for this visit.   Facility-Administered Medications Ordered in Other Visits  Medication Dose Route Frequency Provider Last Rate Last Admin  . leuprolide (6 Month) (ELIGARD) injection 45 mg  45 mg Subcutaneous Once [name], MD        PHYSICAL EXAMINATION: ECOG PERFORMANCE STATUS: 1 - Symptomatic but completely ambulatory  BP 127/75 (BP Location: Left Arm, Patient Position: Sitting, Cuff Size: Normal)   Pulse 79   Temp (!) 96.9 F (36.1 C) (Tympanic)   Resp 20   Wt 129 lb 12.8 oz (58.9 kg)   SpO2 100%   BMI 20.95 kg/m   Filed Weights   09/15/19 0850  Weight: 129 lb 12.8 oz (58.9 kg)     Physical Exam Constitutional:      Comments: Accompanied by his daughter.  Walking himself.  HENT:     Head: Normocephalic and atraumatic.     Mouth/Throat:     Pharynx: No oropharyngeal exudate.  Eyes:     Pupils: Pupils are equal, round, and reactive to light.  Cardiovascular:     Rate and Rhythm: Normal rate and regular rhythm.  Pulmonary:     Effort: Pulmonary effort is normal. No respiratory distress.     Breath sounds: Normal breath sounds. No wheezing.  Abdominal:     General: Bowel sounds are normal. There is no distension.     Palpations: Abdomen is soft. There is no mass.     Tenderness: There is no abdominal tenderness. There is no guarding or rebound.  Musculoskeletal:        General: No tenderness. Normal range of motion.     Cervical back: Normal range of motion and neck supple.  Skin:    General: Skin is warm.  Neurological:     Mental Status: He is alert and oriented to person, place, and time.  Psychiatric:        Mood and Affect: Affect normal.      LABORATORY DATA:  I have reviewed the data as listed    Component Value Date/Time   NA 135 09/15/2019 0830   NA 136 05/07/2012 1328   K 4.5 09/15/2019 0830   K 4.5 05/07/2012 1328   CL 101 09/15/2019 0830   CL 104 05/07/2012 1328   CO2 28 09/15/2019 0830   CO2 28 05/07/2012 1328   GLUCOSE 132 (H) 09/15/2019 0830   GLUCOSE 73 05/07/2012 1328   BUN 21 09/15/2019 0830   BUN 16 05/07/2012 1328   CREATININE 1.11 09/15/2019 0830   CREATININE 0.84 05/07/2012 1328   CALCIUM 9.2 09/15/2019 0830   CALCIUM 8.8 05/07/2012 1328   PROT 7.9 09/15/2019 0830   ALBUMIN 4.1 09/15/2019 0830   AST 22 09/15/2019 0830   ALT 14 09/15/2019 0830   ALKPHOS 58 09/15/2019 0830   BILITOT 0.7 09/15/2019 0830   GFRNONAA >60 09/15/2019 0830   GFRNONAA >60 05/07/2012 1328   GFRAA >60 09/15/2019 0830   GFRAA >60 05/07/2012 1328    No results found for: SPEP, UPEP  Lab Results  Component Value Date   WBC 6.2  09/15/2019   NEUTROABS 3.7 09/15/2019   HGB 12.1 (L) 09/15/2019   HCT 36.1 (L) 09/15/2019   MCV 93.0 09/15/2019   PLT 238 09/15/2019      Chemistry      Component Value Date/Time   NA 135 09/15/2019 0830   NA 136 05/07/2012 1328  K 4.5 09/15/2019 0830   K 4.5 05/07/2012 1328   CL 101 09/15/2019 0830   CL 104 05/07/2012 1328   CO2 28 09/15/2019 0830   CO2 28 05/07/2012 1328   BUN 21 09/15/2019 0830   BUN 16 05/07/2012 1328   CREATININE 1.11 09/15/2019 0830   CREATININE 0.84 05/07/2012 1328      Component Value Date/Time   CALCIUM 9.2 09/15/2019 0830   CALCIUM 8.8 05/07/2012 1328   ALKPHOS 58 09/15/2019 0830   AST 22 09/15/2019 0830   ALT 14 09/15/2019 0830   BILITOT 0.7 09/15/2019 0830       RADIOGRAPHIC STUDIES: I have personally reviewed the radiological images as listed and agreed with the findings in the report. No results found.   ASSESSMENT & PLAN:  Prostate cancer [facility]) # Metastatic prostate cancer/ castrate sensitive [bulky retroperitoneal adenopathy; invasion into the bladder; rectum]. Eligard [45mq 646mlast 03/10/2019; On Xtandi; February 2020-CT scan shows continued significant improvement of the retroperitoneal adenopathy; bladder/rectal invasion.;  Diffuse diffuse osseous metastatic disease-STABLE.   #Continue Xtandi 80 mg a day [secondary to fatigue]; NOV 2021--STABLE/  0.1.  PSA-July  2021-0.03- STABLE;   Hold off imaging at this time.   # Anemia-iron deficiency-hemoglobin 11-12- STABLE;  on PO iorn.   # DM-type II/insulin-STABLE; BG-132- post BF.   # DISPOSITION:  # Eligard today.  # Follow up in 2 month-MD/labs-cbc/cmp/psa [[place]/friday early AM appts]Dr.B    Orders Placed This Encounter  Procedures  . CBC with Differential    Standing Status:   Future    Standing Expiration Date:   09/14/2020  . Comprehensive metabolic panel    Standing Status:   Future    Standing Expiration Date:   09/14/2020  . PSA    Standing Status:   Future     Standing Expiration Date:   09/14/2020   All questions were answered. The patient knows to call the clinic with any problems, questions or concerns.      GoCammie SickleMD 09/15/2019 9:30 AM

## 2019-09-16 NOTE — Progress Notes (Signed)
Hi Hina- the PSA over is stable; no new recommendations; follow up as planned. Dr.B

## 2019-09-29 ENCOUNTER — Other Ambulatory Visit: Payer: Self-pay | Admitting: Internal Medicine

## 2019-09-29 DIAGNOSIS — C61 Malignant neoplasm of prostate: Secondary | ICD-10-CM

## 2019-09-29 NOTE — Telephone Encounter (Signed)
CBC with Differential Order: [id number] Status:  Final result Visible to patient:  Yes (not seen) Next appt:  10/30/2019 at 11:15 AM in Urology Michael Espy, [name]) Dx:  Prostate cancer [facility])  1 Result Note   1 Patient Communication  Ref Range & Units 2 wk ago 2 mo ago  WBC 4.0 - 10.5 K/uL 6.2  5.7   RBC 4.22 - 5.81 MIL/uL 3.88Low  3.80Low   Hemoglobin 13.0 - 17.0 g/dL 12.1Low  11.6Low   HCT 39 - 52 % 36.1Low  35.0Low   MCV 80.0 - 100.0 fL 93.0  92.1   MCH 26.0 - 34.0 pg 31.2  30.5   MCHC 30.0 - 36.0 g/dL 33.5  33.1   RDW 11.5 - 15.5 % 12.7  12.4   Platelets 150 - 400 K/uL 238  213   nRBC 0.0 - 0.2 % 0.0  0.0   Neutrophils Relative % % 58  55   Neutro Abs 1.7 - 7.7 K/uL 3.7  3.2   Lymphocytes Relative % 27  29   Lymphs Abs 0.7 - 4.0 K/uL 1.7  1.6   Monocytes Relative % 11  13   Monocytes Absolute 0 - 1 K/uL 0.7  0.7   Eosinophils Relative % 2  2   Eosinophils Absolute 0 - 0 K/uL 0.1  0.1   Basophils Relative % 1  1   Basophils Absolute 0 - 0 K/uL 0.0  0.0   Immature Granulocytes % 1  0   Abs Immature Granulocytes 0.00 - 0.07 K/uL 0.03  0.02 CM   Comment: Performed at [facility], [address] [place], [place] [address]  Resulting Agency  [facility] LAB [facility]      Specimen Collected: 09/15/19 08:30 Last Resulted: 09/15/19 08:41     Lab Flowsheet   Order Details   View Encounter   Lab and Collection Details   Routing   Result History     CM=Additional comments    Result Care Coordination  Result Notes  [name], Michael [name]  09/16/2019 1:52 PM EDT Back to Top    Hi Hina- the PSA over is stable; no new recommendations; follow up as planned. Dr.B   Patient Communication Edit Comments Add Notifications Back to Top   Hi Hina- the PSA over is stable; no new recommendations; follow up as planned. Call if any questions.  Dr.B  Written by [name], Michael [name] on 09/16/2019 1:52 PM EDT    Other Results from  09/15/2019  PSA  Status:  Final result Visible to patient:  Yes (not seen) Next appt:  10/30/2019 at 11:15 AM in Urology Michael Espy, Michael [name]) Dx:  Prostate cancer [facility]) Order: [id number]  1 Result Note   1 Patient Communication  Ref Range & Units 2 wk ago 2 mo ago  Prostatic Specific Antigen 0.00 - 4.00 ng/mL 0.06  0.04 CM   Comment: (NOTE)  While PSA levels of <=4.0 ng/ml are reported as reference range, some  men with levels below 4.0 ng/ml can have prostate cancer and many men  with PSA above 4.0 ng/ml do not have prostate cancer. Other tests  such as free PSA, age specific reference ranges, PSA velocity and PSA  doubling time may be helpful especially in men less than 65 years  old.  Performed at [facility], [place] [address], [place], [place]  [address]   Resulting Agency  [facility] [place] [facility]  Specimen Collected: 09/15/19 08:30 Last Resulted: 09/15/19 20:21     Lab Flowsheet   Order Details   View Encounter   Lab and Collection Details   Routing   Result History     CM=Additional comments    Result Care Coordination  Result Notes  [name], Michael [name]  09/16/2019 1:52 PM EDT Back to Top    Hi Hina- the PSA over is stable; no new recommendations; follow up as planned. Dr.B   Patient Communication Edit Comments Add Notifications Back to Top   Hi Hina- the PSA over is stable; no new recommendations; follow up as planned. Call if any questions.  Dr.B  Written by [name], Michael [name] on 09/16/2019 1:52 PM EDT      Contains abnormal dataComprehensive metabolic panel  Status:  Final result Visible to patient:  Yes (not seen) Next appt:  10/30/2019 at 11:15 AM in Urology Michael Espy, Michael [name]) Dx:  Prostate cancer [facility]) Order: [id number]  1 Result Note   1 Patient Communication  Ref Range & Units 2 wk ago 2 mo ago  Sodium 135 - 145 mmol/L 135  135   Potassium 3.5 - 5.1 mmol/L 4.5  4.7   Chloride 98 - 111 mmol/L 101  102    CO2 22 - 32 mmol/L 28  25   Glucose, Bld 70 - 99 mg/dL 132High  142High CM   Comment: Glucose reference range applies only to samples taken after fasting for at least 8 hours.  BUN 8 - 23 mg/dL 21  19   Creatinine, Ser 0.61 - 1.24 mg/dL 1.11  1.06   Calcium 8.9 - 10.3 mg/dL 9.2  9.0   Total Protein 6.5 - 8.1 g/dL 7.9  7.3   Albumin 3.5 - 5.0 g/dL 4.1  4.0   AST 15 - 41 U/L 22  19   ALT 0 - 44 U/L 14  14   Alkaline Phosphatase 38 - 126 U/L 58  55   Total Bilirubin 0.3 - 1.2 mg/dL 0.7  0.6   GFR calc non Af Amer >60 mL/min >60  >60   GFR calc Af Amer >60 mL/min >60  >60   Anion gap 5 - 15 6  8  CM   Comment: Performed at [facility], [address] [place], [place] [address]  Resulting Agency  [facility] CLIN LAB [facility] LAB      Specimen Collected: 09/15/19 08:30 Last Resulted: 09/15/19 08:55

## 2019-10-06 MED FILL — XTANDI 40 MG CAPSULE: 40 | 30 days supply | Qty: 60 | Fill #0

## 2019-10-24 ENCOUNTER — Other Ambulatory Visit: Payer: Self-pay | Admitting: Urology

## 2019-10-24 DIAGNOSIS — R339 Retention of urine, unspecified: Secondary | ICD-10-CM

## 2019-10-24 DIAGNOSIS — C61 Malignant neoplasm of prostate: Secondary | ICD-10-CM

## 2019-10-30 ENCOUNTER — Ambulatory Visit: Payer: Medicare Other | Admitting: Urology

## 2019-11-05 MED FILL — XTANDI 40 MG CAPSULE: 40 | 30 days supply | Qty: 60 | Fill #1

## 2019-11-06 NOTE — Progress Notes (Signed)
11/07/2019 2:50 PM   [name] 1933-04-04 [id number]  Referring provider: [name], [place] [facility],  [place] [address] Chief Complaint  Patient presents with  . Follow-up    40mth follow-up    HPI: Michael [name] is a 86 y.o. male who returns for a 6 months follow up of incomplete bladder emptying, prostate cancer, bilateral hydronephrosis, and chronic bacteria colonization. Patient is accompanied by daughter and Airline pilot were used.   Previous history:  His advanced metastatic prostate cancer is managed by Dr. Rogue Bussing on ADT/Xtandi and Delton See.  He was initially seen and evaluated during inpatient hospital admissionin 2/2019with urinary retention, acute kidney injury with bilateral hydronephrosis. He underwent biopsy of his bladder neck revealing prostate cancer and started on ADT. He required surgical intervention for Foley catheter placement given severe bladder neck contracture at which time a left ureteral stent was placed and ultimately a right PCN.Right PCN was later converted to indwelling double-J stent. His Foleywas subsequentlyremoved and he self cathing several times aweekto keep his bladder neck open (low PVRs).  CT abdomen pelvis with contrast on 07/2017 showsresponse to ADT with near complete resolution of metastatic abdominal and pelvic lymphadenopathy.  He returned to the operating room on 09/12/2017 for bilateral retrograde pyelogram and ultimately stent exchange due to poor excretion of contrast material the time of retrogrades.  Renal ultrasound on 03/08/2018 shows moderate bilateral hydronephrosis.  CT scan on 03/29/2018 shows bilateral renal pelvic fullness without overt hydroureteronephrosis.  Creatinine was stable at 1.11 and PSA 0.06 as of 09/15/2019.  PVR 257 mL. Patient has an increase in urgency and frequency. He is urinating every hour. He notes his urinary symptoms are  stable.  His stream is good. Remains on Myrbetriq 25 mg. Patient is happy with his current symptoms.    PMH: Past Medical History:  Diagnosis Date  . Anemia    iron deficiency  . Aortic atherosclerosis ([place])   . Bilateral carotid artery disease (HCC)    Mild plaque formation without obstructive disease noted on carotid Doppler.  . Diabetes mellitus without complication ([place])   . Essential hypertension   . GERD (gastroesophageal reflux disease)   . Hyperlipidemia   . Hypertension   . Left carotid artery stenosis   . Nonobstructive Coronary artery disease    a. Previous cardiac catheterization at [facility] in 2010. The patient was told about 2 blockages which did not require revascularization; b. 01/2015 Cath: LM nl, LAD 10p/m, 66m/d, D2 30ost, LCX nl, RCA min irregs, EF 55-65%; b. 12/2018 MV: No ischemia/infarct. CT images w/ mod 3 vessel Cor Ca2+ and mild-mod Ao atherosclerosis.  . Prostate cancer ([place]) 03/2017  . Raynaud disease    a. Managed w/ nifedipine.    Surgical History: Past Surgical History:  Procedure Laterality Date  . CARDIAC CATHETERIZATION  2010  . CARDIAC CATHETERIZATION N/A 02/03/2015   Procedure: Left Heart Cath and Coronary Angiography;  Surgeon: Wellington [name], MD;  Location: [place] [facility];  Service: Cardiovascular;  Laterality: N/A;  . CATARACT EXTRACTION Bilateral   . CYSTOSCOPY W/ RETROGRADES Bilateral 04/11/2017   Procedure: CYSTOSCOPY WITH RETROGRADE PYELOGRAM;  Surgeon: [name], MD;  Location: [facility];  Service: Urology;  Laterality: Bilateral;  . CYSTOSCOPY W/ RETROGRADES Bilateral 09/12/2017   Procedure: CYSTOSCOPY WITH RETROGRADE PYELOGRAM;  Surgeon: [name], MD;  Location: [facility];  Service: Urology;  Laterality: Bilateral;  . CYSTOSCOPY W/ RETROGRADES Bilateral 02/25/2018   Procedure: CYSTOSCOPY WITH  RETROGRADE PYELOGRAM;  Surgeon: [name], MD;  Location: [facility];  Service: Urology;  Laterality: Bilateral;  . CYSTOSCOPY W/  URETERAL STENT PLACEMENT Bilateral 09/12/2017   Procedure: CYSTOSCOPY WITH STENT REPLACEMENT;  Surgeon: [name], MD;  Location: [facility];  Service: Urology;  Laterality: Bilateral;  . CYSTOSCOPY W/ URETERAL STENT REMOVAL Bilateral 02/25/2018   Procedure: CYSTOSCOPY WITH STENT REMOVAL;  Surgeon: [name], MD;  Location: [facility];  Service: Urology;  Laterality: Bilateral;  . CYSTOSCOPY WITH STENT PLACEMENT Left 04/11/2017   Procedure: CYSTOSCOPY WITH STENT PLACEMENT and fulgeration;  Surgeon: [name], MD;  Location: [facility];  Service: Urology;  Laterality: Left;  . CYSTOSCOPY WITH URETHRAL DILATATION Bilateral 04/11/2017   Procedure: CYSTOSCOPY WITH URETHRAL DILATATION;  Surgeon: [name], MD;  Location: [facility];  Service: Urology;  Laterality: Bilateral;  . IR CONVERT RIGHT NEPHROSTOMY TO NEPHROURETERAL CATH  05/21/2017  . IR NEPHROSTOMY PLACEMENT RIGHT  04/13/2017    Home Medications:  Allergies as of 11/07/2019   No Known Allergies     Medication List       Accurate as of November 07, 2019 11:59 PM. If you have any questions, ask your nurse or doctor.        acetaminophen 500 MG tablet Commonly known as: TYLENOL Take 500 mg by mouth every 4 (four) hours as needed for moderate pain or fever.   albuterol 108 (90 Base) MCG/ACT inhaler Commonly known as: VENTOLIN HFA Inhale 2 puffs into the lungs every 4 (four) hours as needed for wheezing or shortness of breath.   atorvastatin 10 MG tablet Commonly known as: LIPITOR Take 10 mg by mouth daily at 6 PM.   Calcium 600+D3 600-800 MG-UNIT Tabs Generic drug: Calcium Carb-Cholecalciferol Take 1 tablet by mouth every evening.   Calcium Carbonate-Vitamin D 600-200 MG-UNIT Tabs Take by mouth.   glimepiride 2 MG tablet Commonly known as: AMARYL Take 2 mg by mouth daily with breakfast.   glucose blood test strip Commonly known as: Accu-Chek Guide Use asdirected three times a day diag E11.65   ibuprofen 200  MG tablet Commonly known as: ADVIL Take 200 mg by mouth every 6 (six) hours as needed for fever or moderate pain.   iron polysaccharides 150 MG capsule Commonly known as: NIFEREX Take 150 mg by mouth daily.   metFORMIN 500 MG 24 hr tablet Commonly known as: GLUCOPHAGE-XR Take 1,000 mg by mouth in the morning and at bedtime.   metoprolol succinate 25 MG 24 hr tablet Commonly known as: TOPROL-XL TAKE 1 TABLET BY MOUTH EVERY DAY FOR BLOOD PRESSURE   multivitamin with minerals tablet Take 1 tablet by mouth daily.   Myrbetriq 25 MG Tb24 tablet Generic drug: mirabegron ER TAKE 1 TABLET BY MOUTH EVERY DAY   NIFEdipine 30 MG 24 hr tablet Commonly known as: ADALAT CC TAKE 1 TABLET BY MOUTH EVERY DAY   omeprazole 20 MG capsule Commonly known as: PRILOSEC TAKE ONE CAPSULE BY MOUTH EVERY DAY   Xtandi 40 MG capsule Generic drug: enzalutamide TAKE 2 CAPSULES (80 MG TOTAL) BY MOUTH DAILY.       Allergies: No Known Allergies  Family History: Family History  Problem Relation Age of Onset  . Hypertension Father     Social History:  reports that he has never smoked. He has never used smokeless tobacco. He reports that he does not drink alcohol and does not use drugs.   Physical Exam: BP (!) 149/79   Pulse 87   Ht 5'  6" (1.676 m)   Wt 131 lb (59.4 kg)   BMI 21.14 kg/m   Constitutional:  Alert and oriented, No acute distress.  Accompanied by daughter today.  We did use a translator but on occasion, the interpreter and the patient had difficulty communicating due to dialect.  On these occasions, she did help clarify. HEENT: [place] AT, moist mucus membranes.  Trachea midline, no masses. Cardiovascular: No clubbing, cyanosis, or edema. Respiratory: Normal respiratory effort, no increased work of breathing. Skin: No rashes, bruises or suspicious lesions. Neurologic: Grossly intact, no focal deficits, moving all 4 extremities. Psychiatric: Normal mood and affect.  Laboratory  Data:  Lab Results  Component Value Date   CREATININE 1.11 09/15/2019     Assessment & Plan:    1. Incomplete bladder emptying Clinically doing better  PVR 257 mL, stably elevated.  Discussed surgical intervention of channel TURP, patient is not interested in this. Will no increase current dose of Mrybetriq due to concern for urinary retention.  Continue Myrbetriq 25 mg.   2. Prostate cancer Managed by Dr. Rogue Bussing at cancer center Currently on Lupron and Xtandi (reduced dose) PSA stable  3. Bilateral hydronephrosis Chronic bilateral renal pelvic fullness Cr stable Most recent imaging 03/2019  4. Chronic Bacteria Colonization   Reviewed guidelines and will treat patient only if symptomatic  No treatment with antibiotics since 04/2019   Follow up in 6 month for PVR.  [facility] [address] [place], [place] [address] [phone]  I, Selena Batten, am acting as a scribe for Dr. [name].  I have reviewed the above documentation for accuracy and completeness, and I agree with the above.   [name], MD

## 2019-11-07 ENCOUNTER — Other Ambulatory Visit: Payer: Self-pay

## 2019-11-07 ENCOUNTER — Ambulatory Visit (INDEPENDENT_AMBULATORY_CARE_PROVIDER_SITE_OTHER): Payer: Medicare Other | Admitting: Urology

## 2019-11-07 ENCOUNTER — Encounter: Payer: Self-pay | Admitting: Urology

## 2019-11-07 VITALS — BP 149/79 | HR 87 | Ht 66.0 in | Wt 131.0 lb

## 2019-11-07 DIAGNOSIS — I7025 Atherosclerosis of native arteries of other extremities with ulceration: Secondary | ICD-10-CM

## 2019-11-07 DIAGNOSIS — R8271 Bacteriuria: Secondary | ICD-10-CM | POA: Diagnosis not present

## 2019-11-07 DIAGNOSIS — C61 Malignant neoplasm of prostate: Secondary | ICD-10-CM | POA: Diagnosis not present

## 2019-11-07 DIAGNOSIS — N1330 Unspecified hydronephrosis: Secondary | ICD-10-CM

## 2019-11-17 ENCOUNTER — Inpatient Hospital Stay (HOSPITAL_BASED_OUTPATIENT_CLINIC_OR_DEPARTMENT_OTHER): Payer: Medicare Other | Admitting: Internal Medicine

## 2019-11-17 ENCOUNTER — Inpatient Hospital Stay: Payer: Medicare Other | Attending: Internal Medicine

## 2019-11-17 ENCOUNTER — Encounter: Payer: Self-pay | Admitting: Internal Medicine

## 2019-11-17 ENCOUNTER — Inpatient Hospital Stay: Payer: Medicare Other

## 2019-11-17 ENCOUNTER — Other Ambulatory Visit: Payer: Self-pay

## 2019-11-17 DIAGNOSIS — E119 Type 2 diabetes mellitus without complications: Secondary | ICD-10-CM | POA: Insufficient documentation

## 2019-11-17 DIAGNOSIS — E1165 Type 2 diabetes mellitus with hyperglycemia: Secondary | ICD-10-CM | POA: Insufficient documentation

## 2019-11-17 DIAGNOSIS — I2510 Atherosclerotic heart disease of native coronary artery without angina pectoris: Secondary | ICD-10-CM | POA: Diagnosis not present

## 2019-11-17 DIAGNOSIS — I10 Essential (primary) hypertension: Secondary | ICD-10-CM | POA: Insufficient documentation

## 2019-11-17 DIAGNOSIS — R599 Enlarged lymph nodes, unspecified: Secondary | ICD-10-CM | POA: Diagnosis not present

## 2019-11-17 DIAGNOSIS — D509 Iron deficiency anemia, unspecified: Secondary | ICD-10-CM | POA: Insufficient documentation

## 2019-11-17 DIAGNOSIS — Z191 Hormone sensitive malignancy status: Secondary | ICD-10-CM | POA: Insufficient documentation

## 2019-11-17 DIAGNOSIS — I7300 Raynaud's syndrome without gangrene: Secondary | ICD-10-CM | POA: Diagnosis not present

## 2019-11-17 DIAGNOSIS — C61 Malignant neoplasm of prostate: Secondary | ICD-10-CM

## 2019-11-17 DIAGNOSIS — Z794 Long term (current) use of insulin: Secondary | ICD-10-CM | POA: Diagnosis not present

## 2019-11-17 DIAGNOSIS — Z87891 Personal history of nicotine dependence: Secondary | ICD-10-CM | POA: Insufficient documentation

## 2019-11-17 DIAGNOSIS — Z87442 Personal history of urinary calculi: Secondary | ICD-10-CM | POA: Diagnosis not present

## 2019-11-17 DIAGNOSIS — Z79899 Other long term (current) drug therapy: Secondary | ICD-10-CM | POA: Diagnosis not present

## 2019-11-17 DIAGNOSIS — I700 Atherosclerosis of aorta: Secondary | ICD-10-CM | POA: Diagnosis not present

## 2019-11-17 DIAGNOSIS — K219 Gastro-esophageal reflux disease without esophagitis: Secondary | ICD-10-CM | POA: Diagnosis not present

## 2019-11-17 DIAGNOSIS — R5383 Other fatigue: Secondary | ICD-10-CM | POA: Diagnosis not present

## 2019-11-17 DIAGNOSIS — I7025 Atherosclerosis of native arteries of other extremities with ulceration: Secondary | ICD-10-CM

## 2019-11-17 DIAGNOSIS — E785 Hyperlipidemia, unspecified: Secondary | ICD-10-CM | POA: Insufficient documentation

## 2019-11-17 DIAGNOSIS — Z23 Encounter for immunization: Secondary | ICD-10-CM

## 2019-11-17 LAB — CBC WITH DIFFERENTIAL/PLATELET
Abs Immature Granulocytes: 0.04 10*3/uL (ref 0.00–0.07)
Basophils Absolute: 0 10*3/uL (ref 0.0–0.1)
Basophils Relative: 1 %
Eosinophils Absolute: 0.1 10*3/uL (ref 0.0–0.5)
Eosinophils Relative: 1 %
HCT: 36.5 % — ABNORMAL LOW (ref 39.0–52.0)
Hemoglobin: 12.2 g/dL — ABNORMAL LOW (ref 13.0–17.0)
Immature Granulocytes: 1 %
Lymphocytes Relative: 25 %
Lymphs Abs: 1.6 10*3/uL (ref 0.7–4.0)
MCH: 30.8 pg (ref 26.0–34.0)
MCHC: 33.4 g/dL (ref 30.0–36.0)
MCV: 92.2 fL (ref 80.0–100.0)
Monocytes Absolute: 0.6 10*3/uL (ref 0.1–1.0)
Monocytes Relative: 9 %
Neutro Abs: 3.9 10*3/uL (ref 1.7–7.7)
Neutrophils Relative %: 63 %
Platelets: 223 10*3/uL (ref 150–400)
RBC: 3.96 MIL/uL — ABNORMAL LOW (ref 4.22–5.81)
RDW: 12.6 % (ref 11.5–15.5)
WBC: 6.1 10*3/uL (ref 4.0–10.5)
nRBC: 0 % (ref 0.0–0.2)

## 2019-11-17 LAB — COMPREHENSIVE METABOLIC PANEL
ALT: 13 U/L (ref 0–44)
AST: 20 U/L (ref 15–41)
Albumin: 4.1 g/dL (ref 3.5–5.0)
Alkaline Phosphatase: 54 U/L (ref 38–126)
Anion gap: 8 (ref 5–15)
BUN: 16 mg/dL (ref 8–23)
CO2: 26 mmol/L (ref 22–32)
Calcium: 9 mg/dL (ref 8.9–10.3)
Chloride: 101 mmol/L (ref 98–111)
Creatinine, Ser: 1.1 mg/dL (ref 0.61–1.24)
GFR calc Af Amer: >60 mL/min (ref >60)
GFR calc non Af Amer: >60 mL/min (ref >60)
Glucose, Bld: 180 mg/dL — ABNORMAL HIGH (ref 70–99)
Potassium: 4.4 mmol/L (ref 3.5–5.1)
Sodium: 135 mmol/L (ref 135–145)
Total Bilirubin: 0.6 mg/dL (ref 0.3–1.2)
Total Protein: 7.6 g/dL (ref 6.5–8.1)

## 2019-11-17 LAB — PSA: Prostatic Specific Antigen: 0.03 ng/mL (ref 0.00–4.00)

## 2019-11-17 MED ORDER — INFLUENZA VAC A&B SA ADJ QUAD 0.5 ML IM PRSY
0.5000 mL | PREFILLED_SYRINGE | Freq: Once | INTRAMUSCULAR | Status: AC
  Administered 2019-11-17: 0.5 mL via INTRAMUSCULAR
  Filled 2019-11-17: qty 0.5

## 2019-11-17 NOTE — Assessment & Plan Note (Addendum)
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 Eligard [3m q 53m; last 03/10/2019; On Xtandi; February 2020-CT scan shows continued significant improvement of the retroperitoneal adenopathy; bladder/rectal invasion.;  Diffuse diffuse osseous metastatic disease-STABLE.   #Continue Xtandi 80 mg a day [secondary to fatigue]; NOV 2021--STABLE/  0.1.  PSA-July 2021-0.06- STABLE;  Hold off imaging at this time. Will order Eligard at next visit.   # Anemia-iron deficiency-hemoglobin 12.2-STABLE;  on PO iron  # DM-type II/insulin-STABLE; BG-180- post BF.   # COVID BOOSTER: Discussed given patient's diagnosis and other comorbidities/therapies-patient would be considered immunocompromised.  As per CDC recommendation/FDA approval-I would recommend booster vaccine. Flu shot today.   # DISPOSITION: Flu shot today # Follow up in 2 month-MD/labs-cbc/cmp/psa [[place]/friday early AM appts]Dr.B

## 2019-11-17 NOTE — Progress Notes (Signed)
[facility] OFFICE PROGRESS NOTE  Patient Care Team: [name], MD as PCP - General (Internal Medicine) Wellington [name], MD as PCP - Cardiology (Cardiology)  Cancer Staging No matching staging information was found for the patient.   Oncology History Overview Note  # FEB 2019-Metastatic prostate cancer/ castrate sensitive [bulky retroperitoneal adenopathy; invasion into the bladder; rectum] Bladder Bx- prostate. On Degarelix  # April 3rd 2019- [name] [141md];  Lupron q 6 M [[id number]]; MID AUG 2019- reduced dose to 84mday  # Poorly controlled DM-on insulin  # s/p right PCN [explanted]/ Foley cath [Dr.Brandon]; multiple UTIs. [s/p ID; Dr.Ravishankar]  ----------------------------------------------------------------- DIAGNOSIS: [ FEB 2019] MET. PROSTATE CA  STAGE:   IV ;GOALS: PALLIATIVE  CURRENT/MOST RECENT THERAPY [ Feb-April2019] Lupron+ [name]    [facility]     INTERVAL HISTORY:  Michael [name].  male pleasant patient [name] is here for follow-up.  Patient denies any nausea vomiting or recent UTIs.  Denies any chest pain or shortness of the cough.  His appetite is good.  No weight loss.  Mild to moderate fatigue.  Review of Systems  Constitutional: Positive for malaise/fatigue. Negative for chills, diaphoresis, fever and weight loss.  HENT: Negative for nosebleeds and sore throat.   Eyes: Negative for double vision.  Respiratory: Negative for cough, hemoptysis, sputum production and wheezing.   Cardiovascular: Negative for chest pain, palpitations, orthopnea and leg swelling.  Gastrointestinal: Negative for abdominal pain, blood in stool, constipation, diarrhea, heartburn, melena, nausea and vomiting.  Musculoskeletal: Positive for back pain and joint pain.  Skin: Negative.  Negative for itching and rash.  Neurological: Negative for  dizziness, tingling, focal weakness, weakness and headaches.  Endo/Heme/Allergies: Does not bruise/bleed easily.  Psychiatric/Behavioral: Negative for depression. The patient is not nervous/anxious and does not have insomnia.       PAST MEDICAL HISTORY :  Past Medical History:  Diagnosis Date  . Anemia    iron deficiency  . Aortic atherosclerosis ([place]  . Bilateral carotid artery disease (HCC)    Mild plaque formation without obstructive disease noted on carotid Doppler.  . [place] complication ([place]  . Essential hypertension   . GERD (gastroesophageal reflux disease)   . Hyperlipidemia   . Hypertension   . Left carotid artery stenosis   . Nonobstructive Coronary artery disease    a. [facility]. The patient was told about 2 blockages which did not require revascularization; b. 01/2015 Cath: LM nl, LAD 10p/m, 4564m D2 30ost, LCX nl, RCA min irregs, EF 55-65%; b. 12/2018 MV: No ischemia/infarct. CT images w/ mod 3 vessel Cor Ca2+ and mild-mod Ao atherosclerosis.  . [place]  . Raynaud disease    a. Managed w/ nifedipine.    PAST SURGICAL HISTORY :   Past Surgical History:  Procedure Laterality Date  . CARDIAC CATHETERIZATION  2010  . CARDIAC CATHETERIZATION N/A 02/03/2015   Procedure: Left Heart Cath and Coronary Angiography;  Surgeon: MuhWellington HampshireD;  Location: [facility];  Service: Cardiovascular;  Laterality: N/A;  . CATARACT EXTRACTION Bilateral   . CYSTOSCOPY W/ RETROGRADES Bilateral 04/11/2017   Procedure: CYSTOSCOPY WITH RETROGRADE PYELOGRAM;  Surgeon: BraHollice EspyD;  Location: [facility];  Service: Urology;  Laterality: Bilateral;  . CYSTOSCOPY W/ RETROGRADES Bilateral 09/12/2017   Procedure: CYSTOSCOPY WITH RETROGRADE PYELOGRAM;  Surgeon: BraHollice EspyD;  Location: [facility];  Service: Urology;  Laterality: Bilateral;  . CYSTOSCOPY W/ RETROGRADES Bilateral 02/25/2018    Procedure: CYSTOSCOPY WITH RETROGRADE PYELOGRAM;  Surgeon: [name], MD;  Location: [facility];  Service: Urology;  Laterality: Bilateral;  . CYSTOSCOPY W/ URETERAL STENT PLACEMENT Bilateral 09/12/2017   Procedure: CYSTOSCOPY WITH STENT REPLACEMENT;  Surgeon: [name], MD;  Location: [facility];  Service: Urology;  Laterality: Bilateral;  . CYSTOSCOPY W/ URETERAL STENT REMOVAL Bilateral 02/25/2018   Procedure: CYSTOSCOPY WITH STENT REMOVAL;  Surgeon: [name], MD;  Location: [facility];  Service: Urology;  Laterality: Bilateral;  . CYSTOSCOPY WITH STENT PLACEMENT Left 04/11/2017   Procedure: CYSTOSCOPY WITH STENT PLACEMENT and fulgeration;  Surgeon: [name], MD;  Location: [facility];  Service: Urology;  Laterality: Left;  . CYSTOSCOPY WITH URETHRAL DILATATION Bilateral 04/11/2017   Procedure: CYSTOSCOPY WITH URETHRAL DILATATION;  Surgeon: [name], MD;  Location: [facility];  Service: Urology;  Laterality: Bilateral;  . IR CONVERT RIGHT NEPHROSTOMY TO NEPHROURETERAL CATH  05/21/2017  . IR NEPHROSTOMY PLACEMENT RIGHT  04/13/2017    FAMILY HISTORY :   Family History  Problem Relation Age of Onset  . Hypertension Father     SOCIAL HISTORY:   Social History   Tobacco Use  . Smoking status: Never Smoker  . Smokeless tobacco: Never Used  Vaping Use  . Vaping Use: Never used  Substance Use Topics  . Alcohol use: No  . Drug use: No    ALLERGIES:  has No Known Allergies.  MEDICATIONS:  Current Outpatient Medications  Medication Sig Dispense Refill  . acetaminophen (TYLENOL) 500 MG tablet Take 500 mg by mouth every 4 (four) hours as needed for moderate pain or fever.    [name] albuterol (PROVENTIL HFA;VENTOLIN HFA) 108 (90 Base) MCG/ACT inhaler Inhale 2 puffs into the lungs every 4 (four) hours as needed for wheezing or shortness of breath.    [name] atorvastatin (LIPITOR) 10 MG tablet Take 10 mg by mouth daily at 6 PM.     . Calcium Carb-Cholecalciferol (CALCIUM 600+D3)  600-800 MG-UNIT TABS Take 1 tablet by mouth every evening.     . Calcium Carbonate-Vitamin D 600-200 MG-UNIT TABS Take by mouth.    [name] glimepiride (AMARYL) 2 MG tablet Take 2 mg by mouth daily with breakfast.    . glucose blood (ACCU-CHEK GUIDE) test strip Use asdirected three times a day diag E11.65 100 each 3  . ibuprofen (ADVIL,MOTRIN) 200 MG tablet Take 200 mg by mouth every 6 (six) hours as needed for fever or moderate pain.     . iron polysaccharides (NIFEREX) 150 MG capsule Take 150 mg by mouth daily.     . metFORMIN (GLUCOPHAGE-XR) 500 MG 24 hr tablet Take 1,000 mg by mouth in the morning and at bedtime.     . metoprolol succinate (TOPROL-XL) 25 MG 24 hr tablet TAKE 1 TABLET BY MOUTH EVERY DAY FOR BLOOD PRESSURE 90 tablet 3  . Multiple Vitamins-Minerals (MULTIVITAMIN WITH MINERALS) tablet Take 1 tablet by mouth daily.     [name] MYRBETRIQ 25 MG TB24 tablet TAKE 1 TABLET BY MOUTH EVERY DAY 90 tablet 3  . NIFEdipine (ADALAT CC) 30 MG 24 hr tablet TAKE 1 TABLET BY MOUTH EVERY DAY 90 tablet 2  . omeprazole (PRILOSEC) 20 MG capsule TAKE ONE CAPSULE BY MOUTH EVERY DAY 90 capsule 2  . XTANDI 40 MG capsule TAKE 2 CAPSULES (80 MG TOTAL) BY MOUTH DAILY. 60 capsule 3   No current facility-administered medications for  this visit.    PHYSICAL EXAMINATION: ECOG PERFORMANCE STATUS: 1 - Symptomatic but completely ambulatory  BP 139/84 (BP Location: Right Arm, Patient Position: Sitting, Cuff Size: Normal)   Pulse 76   Temp (!) 96 F (35.6 C) (Tympanic)   Resp 16   Ht '5\' 6"'  (1.676 m)   Wt 130 lb (59 kg)   SpO2 100%   BMI 20.98 kg/m   Filed Weights   11/17/19 0828  Weight: 130 lb (59 kg)    Physical Exam Constitutional:      Comments: Accompanied by his daughter.  Walking himself.  HENT:     Head: Normocephalic and atraumatic.     Mouth/Throat:     Pharynx: No oropharyngeal exudate.  Eyes:     Pupils: Pupils are equal, round, and reactive to light.  Cardiovascular:     Rate and Rhythm:  Normal rate and regular rhythm.  Pulmonary:     Effort: Pulmonary effort is normal. No respiratory distress.     Breath sounds: Normal breath sounds. No wheezing.  Abdominal:     General: Bowel sounds are normal. There is no distension.     Palpations: Abdomen is soft. There is no mass.     Tenderness: There is no abdominal tenderness. There is no guarding or rebound.  Musculoskeletal:        General: No tenderness. Normal range of motion.     Cervical back: Normal range of motion and neck supple.  Skin:    General: Skin is warm.  Neurological:     Mental Status: He is alert and oriented to person, place, and time.  Psychiatric:        Mood and Affect: Affect normal.      LABORATORY DATA:  I have reviewed the data as listed    Component Value Date/Time   NA 135 11/17/2019 0819   NA 136 05/07/2012 1328   K 4.4 11/17/2019 0819   K 4.5 05/07/2012 1328   CL 101 11/17/2019 0819   CL 104 05/07/2012 1328   CO2 26 11/17/2019 0819   CO2 28 05/07/2012 1328   GLUCOSE 180 (H) 11/17/2019 0819   GLUCOSE 73 05/07/2012 1328   BUN 16 11/17/2019 0819   BUN 16 05/07/2012 1328   CREATININE 1.10 11/17/2019 0819   CREATININE 0.84 05/07/2012 1328   CALCIUM 9.0 11/17/2019 0819   CALCIUM 8.8 05/07/2012 1328   PROT 7.6 11/17/2019 0819   ALBUMIN 4.1 11/17/2019 0819   AST 20 11/17/2019 0819   ALT 13 11/17/2019 0819   ALKPHOS 54 11/17/2019 0819   BILITOT 0.6 11/17/2019 0819   GFRNONAA >60 11/17/2019 0819   GFRNONAA >60 05/07/2012 1328   GFRAA >60 11/17/2019 0819   GFRAA >60 05/07/2012 1328    No results found for: SPEP, UPEP  Lab Results  Component Value Date   WBC 6.1 11/17/2019   NEUTROABS 3.9 11/17/2019   HGB 12.2 (L) 11/17/2019   HCT 36.5 (L) 11/17/2019   MCV 92.2 11/17/2019   PLT 223 11/17/2019      Chemistry      Component Value Date/Time   NA 135 11/17/2019 0819   NA 136 05/07/2012 1328   K 4.4 11/17/2019 0819   K 4.5 05/07/2012 1328   CL 101 11/17/2019 0819   CL  104 05/07/2012 1328   CO2 26 11/17/2019 0819   CO2 28 05/07/2012 1328   BUN 16 11/17/2019 0819   BUN 16 05/07/2012 1328   CREATININE 1.10 11/17/2019 4481  CREATININE 0.84 05/07/2012 1328      Component Value Date/Time   CALCIUM 9.0 11/17/2019 0819   CALCIUM 8.8 05/07/2012 1328   ALKPHOS 54 11/17/2019 0819   AST 20 11/17/2019 0819   ALT 13 11/17/2019 0819   BILITOT 0.6 11/17/2019 0819       RADIOGRAPHIC STUDIES: I have personally reviewed the radiological images as listed and agreed with the findings in the report. No results found.   ASSESSMENT & PLAN:  Prostate cancer [facility]) # Metastatic prostate cancer/ castrate sensitive [bulky retroperitoneal adenopathy; invasion into the bladder; rectum]. Eligard [85mq 649mlast 03/10/2019; On Xtandi; February 2020-CT scan shows continued significant improvement of the retroperitoneal adenopathy; bladder/rectal invasion.;  Diffuse diffuse osseous metastatic disease-STABLE.   #Continue Xtandi 80 mg a day [secondary to fatigue]; NOV 2021--STABLE/  0.1.  PSA-July 2021-0.06- STABLE;  Hold off imaging at this time. Will order Eligard at next visit.   # Anemia-iron deficiency-hemoglobin 12.2-STABLE;  on PO iron  # DM-type II/insulin-STABLE; BG-180- post BF.   # COVID BOOSTER: Discussed given patient's diagnosis and other comorbidities/therapies-patient would be considered immunocompromised.  As per CDC recommendation/FDA approval-I would recommend booster vaccine. Flu shot today.   # DISPOSITION: Flu shot today # Follow up in 2 month-MD/labs-cbc/cmp/psa [[place]/friday early AM appts]Dr.B    Orders Placed This Encounter  Procedures  . CBC with Differential/Platelet    Standing Status:   Future    Standing Expiration Date:   11/16/2020  . Comprehensive metabolic panel    Standing Status:   Future    Standing Expiration Date:   11/16/2020  . PSA    Standing Status:   Future    Standing Expiration Date:   11/16/2020   All questions were  answered. The patient knows to call the clinic with any problems, questions or concerns.      GoCammie SickleMD 11/17/2019 9:14 AM

## 2019-12-04 MED FILL — XTANDI 40 MG CAPSULE: 40 | 30 days supply | Qty: 60 | Fill #2

## 2020-01-03 IMAGING — XA IR NEPHROSTOMY PLACEMENT RIGHT
4 series · 4 of 4 positions shown · non-contrast
Comparison: CT abdomen pelvis - 04/11/2017;

INDICATION: History of metastatic prostate cancer, now with bilateral
obstructive uropathy. Patient was able to undergo successful
left-sided retrograde ureteral stent placement however failed
attempted right-sided retrograde stent placement secondary to
nonvisualization of the right urinary bladder trigone.

As such, request made for image guided placement of a right-sided
percutaneous nephrostomy catheter.
EXAM:
1. ULTRASOUND GUIDANCE FOR PUNCTURE OF THE RIGHT RENAL COLLECTING
SYSTEM
2. RIGHT PERCUTANEOUS NEPHROSTOMY TUBE PLACEMENT.

[Series 1: fl - angio · 1 of 1 slices shown (1 of 3)]
[im 1/1]
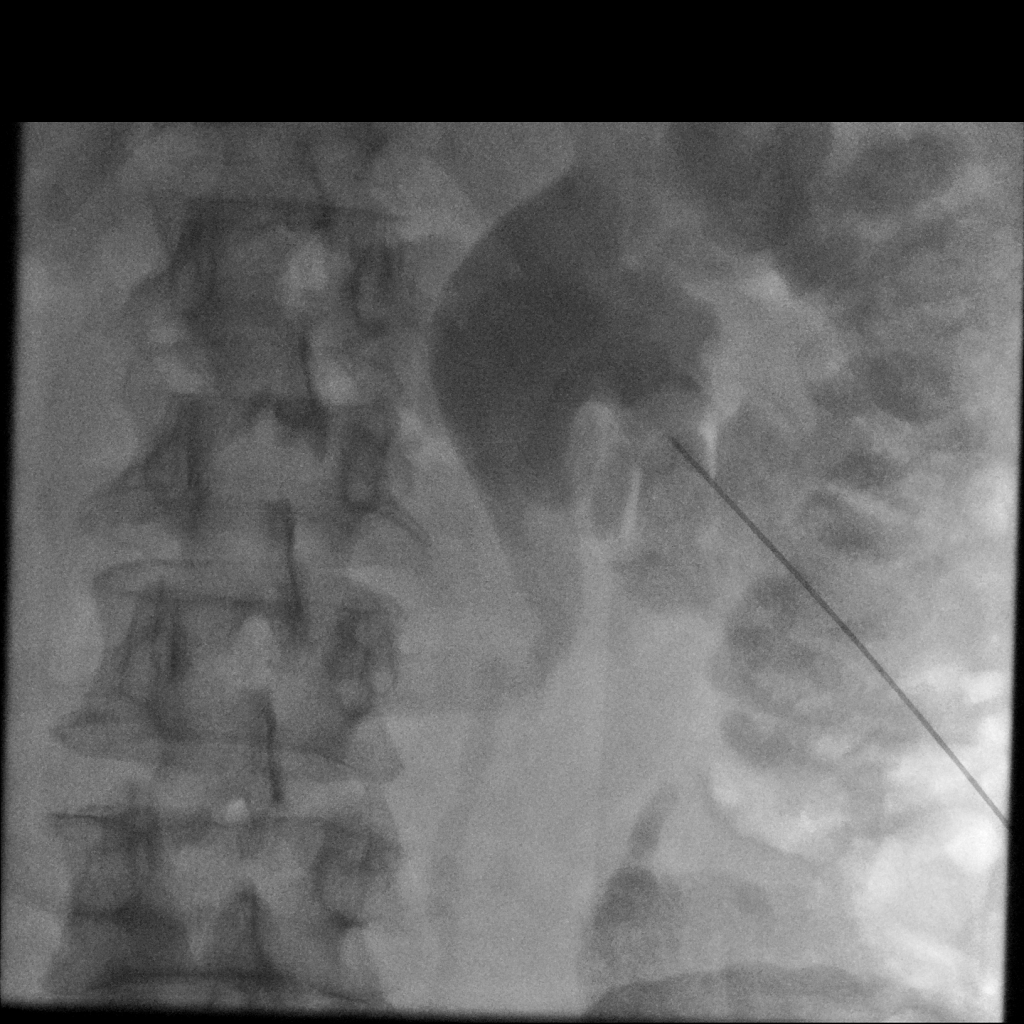

[Series 2: single · 1 of 1 slices shown]
[im 1/1]
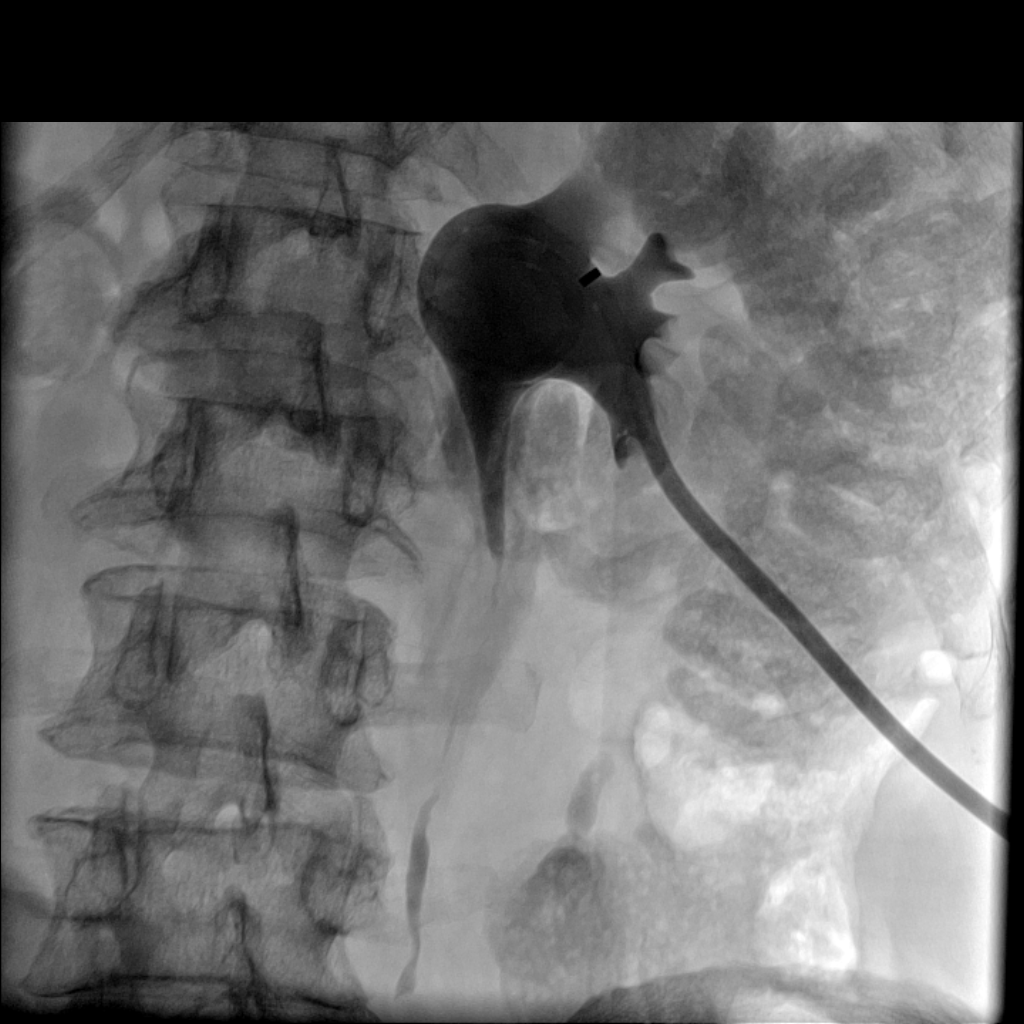

[Series 3: fl - angio · 1 of 1 slices shown (2 of 3)]
[im 1/1]
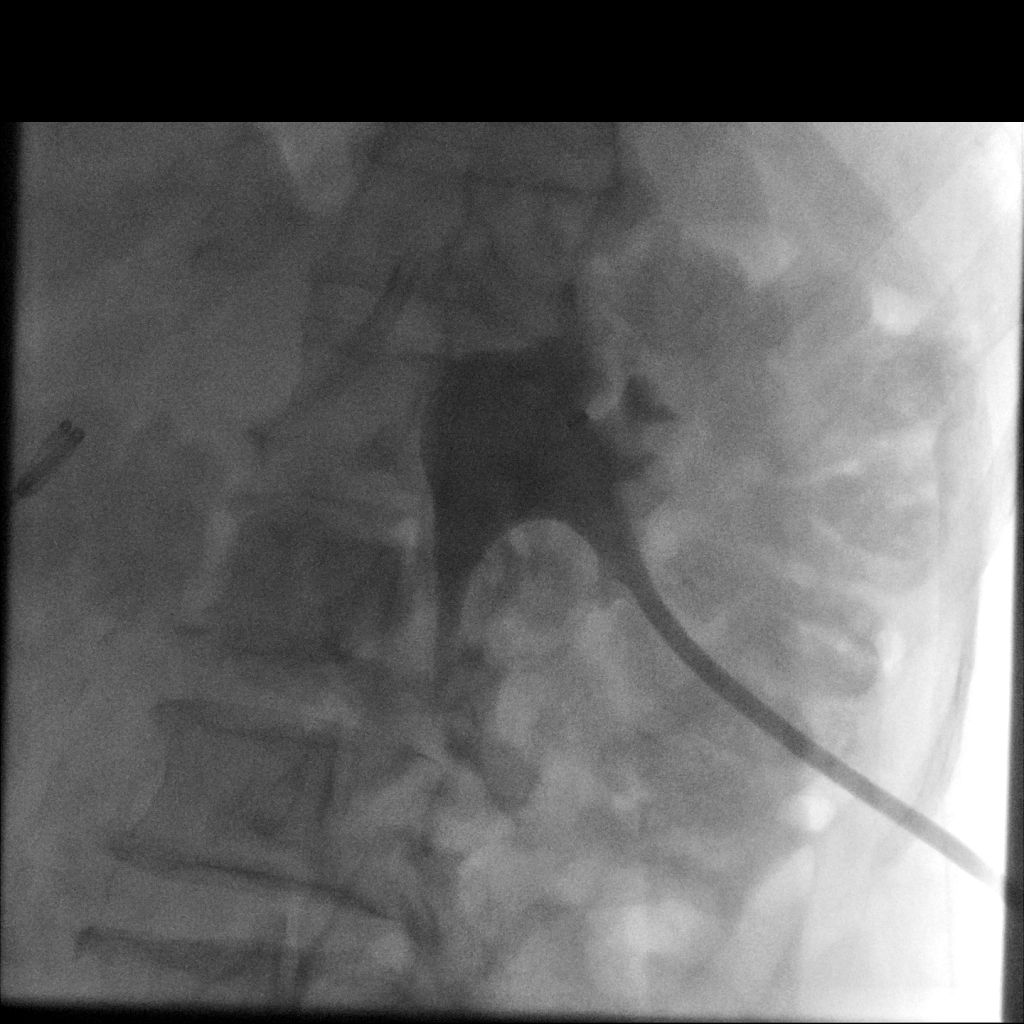

[Series 4: fl - angio · 1 of 1 slices shown (3 of 3)]
[im 1/1]
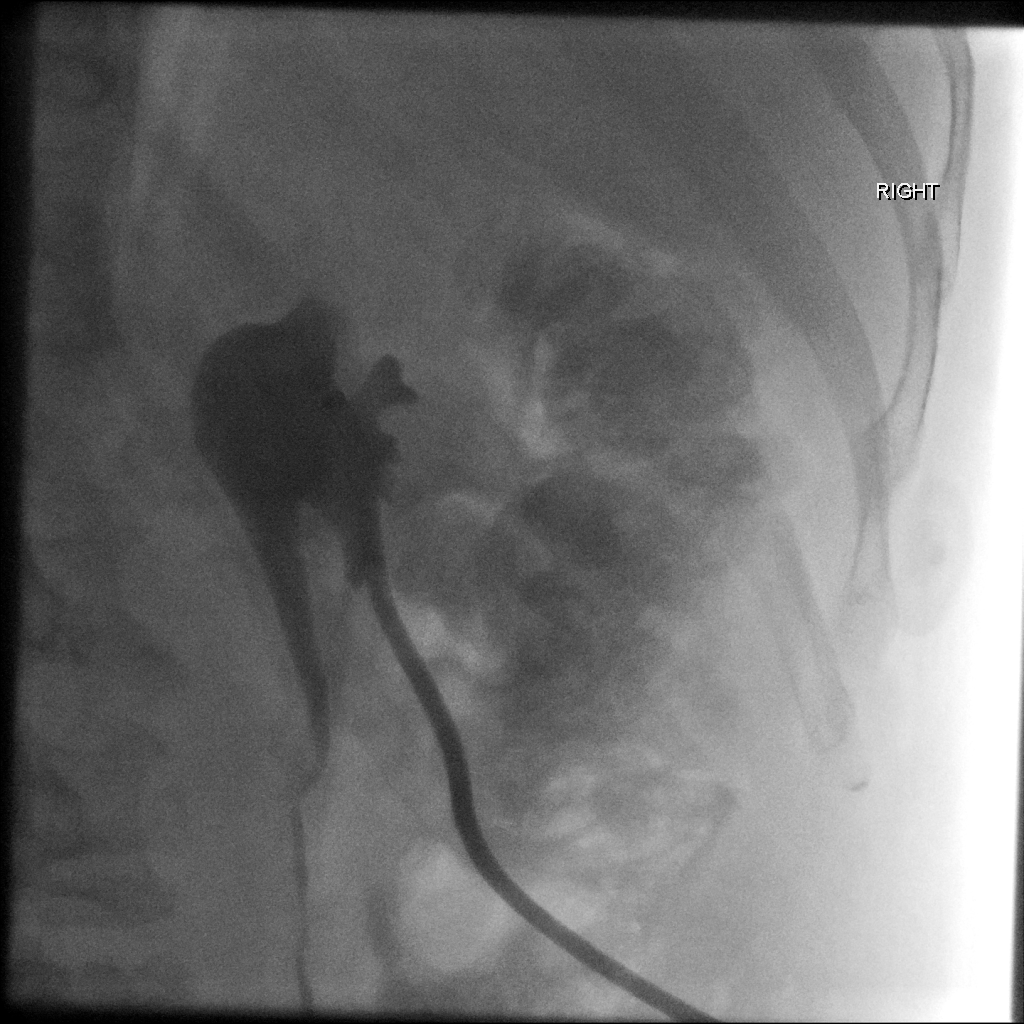

[4 of 4 positions shown; findings below may reference images not displayed]

renal ultrasound -
04/12/2017

MEDICATIONS:
Ciprofloxacin 400 mg IV; the antibiotic was administered in an
appropriate time frame prior to skin puncture.

ANESTHESIA/SEDATION:
Moderate (conscious) sedation was employed during this procedure. A
total of Versed 2 mg and Fentanyl 75 mcg was administered
intravenously.

Moderate Sedation Time: 16 minutes. The patient's level of
consciousness and vital signs were monitored continuously by
radiology nursing throughout the procedure under my direct
supervision.

CONTRAST:  20 mL Isovue 300 administered into the collecting system

FLUOROSCOPY TIME:  1 minute 36 seconds (240 mGy)

COMPLICATIONS:
None immediate.

PROCEDURE:
The procedure, risks, benefits, and alternatives were explained to
the patient. Questions regarding the procedure were encouraged and
answered. The patient understands and consents to the procedure. A
timeout was performed prior to the initiation of the procedure.

The right flank region was prepped with Betadine in a sterile
fashion, and a sterile drape was applied covering the operative
field. A sterile gown and sterile gloves were used for the
procedure. Local anesthesia was provided with 1% Lidocaine with
epinephrine. Ultrasound was used to localize the right kidney. Under
direct ultrasound guidance, a 21 gauge needle was advanced into the
renal collecting system. An ultrasound image documentation was
performed. Access within the collecting system was confirmed with
the efflux of urine followed by contrast injection.

Over a Nitrex wire, the inner three French catheter of an Accustick
set was advanced into the renal collecting system. Contrast
injection was injected into the collecting system as several spot
radiographs were obtained in various obliquities confirming puncture
within a posterior inferior calix. As such, the tract was dilated
with an Accustick stent. Over a guide wire, a 10-French percutaneous
nephrostomy catheter was advanced into the collecting system where
the coil was formed and locked. Contrast was injected and several
sport radiographs were obtained in various obliquities confirming
access. The catheter was secured at the skin with a Prolene
retention suture and a gravity bag was placed. A dressing was
placed. The patient tolerated procedure well without immediate
postprocedural complication.
FINDINGS: Ultrasound scanning demonstrates a moderate dilated right collecting
system. Under direct ultrasound guidance, a posterior inferior calix
was targeted allowing advancement of an 10-French percutaneous
nephrostomy catheter under intermittent fluoroscopic guidance.
Contrast injection confirmed appropriate positioning.
IMPRESSION: Successful ultrasound and fluoroscopic guided placement of a right
sided 10 French PCN.

## 2020-01-07 MED FILL — XTANDI 40 MG CAPSULE: 40 | 30 days supply | Qty: 60 | Fill #3

## 2020-01-19 ENCOUNTER — Other Ambulatory Visit: Payer: Medicare Other

## 2020-01-19 ENCOUNTER — Ambulatory Visit: Payer: Medicare Other | Admitting: Internal Medicine

## 2020-01-23 ENCOUNTER — Inpatient Hospital Stay (HOSPITAL_BASED_OUTPATIENT_CLINIC_OR_DEPARTMENT_OTHER): Payer: Medicare Other | Admitting: Internal Medicine

## 2020-01-23 ENCOUNTER — Other Ambulatory Visit: Payer: Self-pay

## 2020-01-23 ENCOUNTER — Encounter: Payer: Self-pay | Admitting: Internal Medicine

## 2020-01-23 ENCOUNTER — Inpatient Hospital Stay: Payer: Medicare Other | Attending: Internal Medicine

## 2020-01-23 DIAGNOSIS — I10 Essential (primary) hypertension: Secondary | ICD-10-CM | POA: Diagnosis not present

## 2020-01-23 DIAGNOSIS — C61 Malignant neoplasm of prostate: Secondary | ICD-10-CM

## 2020-01-23 DIAGNOSIS — K219 Gastro-esophageal reflux disease without esophagitis: Secondary | ICD-10-CM | POA: Diagnosis not present

## 2020-01-23 DIAGNOSIS — I7025 Atherosclerosis of native arteries of other extremities with ulceration: Secondary | ICD-10-CM | POA: Diagnosis not present

## 2020-01-23 DIAGNOSIS — E119 Type 2 diabetes mellitus without complications: Secondary | ICD-10-CM | POA: Insufficient documentation

## 2020-01-23 DIAGNOSIS — R599 Enlarged lymph nodes, unspecified: Secondary | ICD-10-CM | POA: Diagnosis not present

## 2020-01-23 DIAGNOSIS — R5383 Other fatigue: Secondary | ICD-10-CM | POA: Diagnosis not present

## 2020-01-23 DIAGNOSIS — I700 Atherosclerosis of aorta: Secondary | ICD-10-CM | POA: Insufficient documentation

## 2020-01-23 DIAGNOSIS — C7951 Secondary malignant neoplasm of bone: Secondary | ICD-10-CM | POA: Diagnosis not present

## 2020-01-23 DIAGNOSIS — Z79899 Other long term (current) drug therapy: Secondary | ICD-10-CM | POA: Insufficient documentation

## 2020-01-23 DIAGNOSIS — E785 Hyperlipidemia, unspecified: Secondary | ICD-10-CM | POA: Insufficient documentation

## 2020-01-23 DIAGNOSIS — I7300 Raynaud's syndrome without gangrene: Secondary | ICD-10-CM | POA: Diagnosis not present

## 2020-01-23 DIAGNOSIS — I2510 Atherosclerotic heart disease of native coronary artery without angina pectoris: Secondary | ICD-10-CM | POA: Diagnosis not present

## 2020-01-23 DIAGNOSIS — D649 Anemia, unspecified: Secondary | ICD-10-CM | POA: Diagnosis not present

## 2020-01-23 DIAGNOSIS — E1165 Type 2 diabetes mellitus with hyperglycemia: Secondary | ICD-10-CM | POA: Diagnosis not present

## 2020-01-23 LAB — CBC WITH DIFFERENTIAL/PLATELET
Abs Immature Granulocytes: 0.04 10*3/uL (ref 0.00–0.07)
Basophils Absolute: 0 10*3/uL (ref 0.0–0.1)
Basophils Relative: 1 %
Eosinophils Absolute: 0.1 10*3/uL (ref 0.0–0.5)
Eosinophils Relative: 1 %
HCT: 37.4 % — ABNORMAL LOW (ref 39.0–52.0)
Hemoglobin: 12.5 g/dL — ABNORMAL LOW (ref 13.0–17.0)
Immature Granulocytes: 1 %
Lymphocytes Relative: 30 %
Lymphs Abs: 1.7 10*3/uL (ref 0.7–4.0)
MCH: 30.7 pg (ref 26.0–34.0)
MCHC: 33.4 g/dL (ref 30.0–36.0)
MCV: 91.9 fL (ref 80.0–100.0)
Monocytes Absolute: 0.6 10*3/uL (ref 0.1–1.0)
Monocytes Relative: 10 %
Neutro Abs: 3.3 10*3/uL (ref 1.7–7.7)
Neutrophils Relative %: 57 %
Platelets: 229 10*3/uL (ref 150–400)
RBC: 4.07 MIL/uL — ABNORMAL LOW (ref 4.22–5.81)
RDW: 12.3 % (ref 11.5–15.5)
WBC: 5.8 10*3/uL (ref 4.0–10.5)
nRBC: 0 % (ref 0.0–0.2)

## 2020-01-23 LAB — COMPREHENSIVE METABOLIC PANEL
ALT: 15 U/L (ref 0–44)
AST: 22 U/L (ref 15–41)
Albumin: 4.1 g/dL (ref 3.5–5.0)
Alkaline Phosphatase: 51 U/L (ref 38–126)
Anion gap: 8 (ref 5–15)
BUN: 21 mg/dL (ref 8–23)
CO2: 27 mmol/L (ref 22–32)
Calcium: 9.3 mg/dL (ref 8.9–10.3)
Chloride: 98 mmol/L (ref 98–111)
Creatinine, Ser: 1.07 mg/dL (ref 0.61–1.24)
GFR, Estimated: >60 mL/min (ref >60)
Glucose, Bld: 248 mg/dL — ABNORMAL HIGH (ref 70–99)
Potassium: 4.5 mmol/L (ref 3.5–5.1)
Sodium: 133 mmol/L — ABNORMAL LOW (ref 135–145)
Total Bilirubin: 0.8 mg/dL (ref 0.3–1.2)
Total Protein: 8.1 g/dL (ref 6.5–8.1)

## 2020-01-23 LAB — PSA: Prostatic Specific Antigen: 0.05 ng/mL (ref 0.00–4.00)

## 2020-01-23 NOTE — Assessment & Plan Note (Addendum)
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 Eligard [63m q 29m; last 03/10/2019; On Xtandi; February 2020-CT scan shows continued significant improvement of the retroperitoneal adenopathy; bladder/rectal invasion.;  Diffuse diffuse osseous metastatic disease- STABLE.    #Continue Xtandi 80 mg a day [secondary to fatigue]; SEP 2021--STABLE/  0.03.   # Anemia-iron deficiency-hemoglobin 12.2-STABLE;  on PO iron  # DM-type II/insulin-STABLE; BG-248- post BF.   # COVID BOOSTER: Again reminded, discussed given patient's diagnosis and other comorbidities/therapies-patient would be considered immunocompromised.  As per CDC recommendation/FDA approval-I would recommend booster vaccine.  # DISPOSITION:  # Follow up in 2 month-MD/labs-cbc/cmp/psa; ELIGARD [[place]/friday early AM appts]Dr.B

## 2020-01-23 NOTE — Progress Notes (Signed)
[facility] OFFICE PROGRESS NOTE  Patient Care Team: [name], MD as PCP - General (Internal Medicine) Wellington [name], MD as PCP - Cardiology (Cardiology)  Cancer Staging No matching staging information was found for the patient.   Oncology History Overview Note  # FEB 2019-Metastatic prostate cancer/ castrate sensitive [bulky retroperitoneal adenopathy; invasion into the bladder; rectum] Bladder Bx- prostate. On Degarelix  # April 3rd 2019- [name] [122md];  Lupron q 6 M [[id number]]; MID AUG 2019- reduced dose to 843mday  # Poorly controlled DM-on insulin  # s/p right PCN [explanted]/ Foley cath [Dr.Brandon]; multiple UTIs. [s/p ID; Dr.Ravishankar]  ----------------------------------------------------------------- DIAGNOSIS: [ FEB 2019] MET. PROSTATE CA  STAGE:   IV ;GOALS: PALLIATIVE  CURRENT/MOST RECENT THERAPY [ Feb-April2019] Lupron+ [name]    [facility]     INTERVAL HISTORY:  GoTEDDIE MEHTA89.o.  male pleasant patient [name] is here for follow-up.  Denies any nausea vomiting.  Denies any recent UTIs.  No new shortness of breath or cough.  Mild to moderate fatigue.  No falls.  Review of Systems  Constitutional: Positive for malaise/fatigue. Negative for chills, diaphoresis, fever and weight loss.  HENT: Negative for nosebleeds and sore throat.   Eyes: Negative for double vision.  Respiratory: Negative for cough, hemoptysis, sputum production and wheezing.   Cardiovascular: Negative for chest pain, palpitations, orthopnea and leg swelling.  Gastrointestinal: Negative for abdominal pain, blood in stool, constipation, diarrhea, heartburn, melena, nausea and vomiting.  Musculoskeletal: Positive for back pain and joint pain.  Skin: Negative.  Negative for itching and rash.  Neurological: Negative for dizziness, tingling, focal weakness, weakness  and headaches.  Endo/Heme/Allergies: Does not bruise/bleed easily.  Psychiatric/Behavioral: Negative for depression. The patient is not nervous/anxious and does not have insomnia.       PAST MEDICAL HISTORY :  Past Medical History:  Diagnosis Date  . Anemia    iron deficiency  . [place]ortic atherosclerosis ([facility]  . Bilateral carotid artery disease (HCC)    Mild plaque formation without obstructive disease noted on carotid Doppler.  . Diabetes mellitus without complication ([facility]  . Essential hypertension   . GERD (gastroesophageal reflux disease)   . Hyperlipidemia   . Hypertension   . Left carotid artery stenosis   . Nonobstructive Coronary artery disease    a. [facility]. The patient was told about 2 blockages which did not require revascularization; b. 01/2015 Cath: LM nl, LAD 10p/m, 4555m D2 30ost, LCX nl, RCA min irregs, EF 55-65%; b. 12/2018 MV: No ischemia/infarct. CT images w/ mod 3 vessel Cor Ca2+ and mild-mod Ao atherosclerosis.  . Prostate cancer ([facility]  . Raynaud disease    a. Managed w/ nifedipine.    PAST SURGICAL HISTORY :   Past Surgical History:  Procedure Laterality Date  . CARDIAC CATHETERIZATION  2010  . CARDIAC CATHETERIZATION N/A 02/03/2015   Procedure: Left Heart Cath and Coronary Angiography;  Surgeon: MuhWellington HampshireD;  Location: [facility];  Service: Cardiovascular;  Laterality: N/A;  . CATARACT EXTRACTION Bilateral   . CYSTOSCOPY W/ RETROGRADES Bilateral 04/11/2017   Procedure: CYSTOSCOPY WITH RETROGRADE PYELOGRAM;  Surgeon: BraHollice EspyD;  Location: [facility];  Service: Urology;  Laterality: Bilateral;  . CYSTOSCOPY W/ RETROGRADES Bilateral 09/12/2017   Procedure: CYSTOSCOPY WITH RETROGRADE PYELOGRAM;  Surgeon: BraHollice EspyD;  Location: [facility];  Service: Urology;  Laterality: Bilateral;  . CYSTOSCOPY W/ RETROGRADES Bilateral 02/25/2018   Procedure: CYSTOSCOPY WITH RETROGRADE PYELOGRAM;   Surgeon: [name], MD;  Location: [facility];  Service: Urology;  Laterality: Bilateral;  . CYSTOSCOPY W/ URETERAL STENT PLACEMENT Bilateral 09/12/2017   Procedure: CYSTOSCOPY WITH STENT REPLACEMENT;  Surgeon: [name], MD;  Location: [facility];  Service: Urology;  Laterality: Bilateral;  . CYSTOSCOPY W/ URETERAL STENT REMOVAL Bilateral 02/25/2018   Procedure: CYSTOSCOPY WITH STENT REMOVAL;  Surgeon: [name], MD;  Location: [facility];  Service: Urology;  Laterality: Bilateral;  . CYSTOSCOPY WITH STENT PLACEMENT Left 04/11/2017   Procedure: CYSTOSCOPY WITH STENT PLACEMENT and fulgeration;  Surgeon: [name], MD;  Location: [facility];  Service: Urology;  Laterality: Left;  . CYSTOSCOPY WITH URETHRAL DILATATION Bilateral 04/11/2017   Procedure: CYSTOSCOPY WITH URETHRAL DILATATION;  Surgeon: [name], MD;  Location: [facility];  Service: Urology;  Laterality: Bilateral;  . IR CONVERT RIGHT NEPHROSTOMY TO NEPHROURETERAL CATH  05/21/2017  . IR NEPHROSTOMY PLACEMENT RIGHT  04/13/2017    FAMILY HISTORY :   Family History  Problem Relation Age of Onset  . Hypertension Father     SOCIAL HISTORY:   Social History   Tobacco Use  . Smoking status: Never Smoker  . Smokeless tobacco: Never Used  Vaping Use  . Vaping Use: Never used  Substance Use Topics  . Alcohol use: No  . Drug use: No    ALLERGIES:  has No Known Allergies.  MEDICATIONS:  Current Outpatient Medications  Medication Sig Dispense Refill  . acetaminophen (TYLENOL) 500 MG tablet Take 500 mg by mouth every 4 (four) hours as needed for moderate pain or fever.    [name] albuterol (PROVENTIL HFA;VENTOLIN HFA) 108 (90 Base) MCG/ACT inhaler Inhale 2 puffs into the lungs every 4 (four) hours as needed for wheezing or shortness of breath.    [name] atorvastatin (LIPITOR) 10 MG tablet Take 10 mg by mouth daily at 6 PM.     . Calcium Carb-Cholecalciferol (CALCIUM 600+D3) 600-800 MG-UNIT TABS Take 1 tablet by mouth every  evening.     . Calcium Carbonate-Vitamin D 600-200 MG-UNIT TABS Take by mouth.    [name] glimepiride (AMARYL) 2 MG tablet Take 2 mg by mouth daily with breakfast.    . glucose blood (ACCU-CHEK GUIDE) test strip Use asdirected three times a day diag E11.65 100 each 3  . ibuprofen (ADVIL,MOTRIN) 200 MG tablet Take 200 mg by mouth every 6 (six) hours as needed for fever or moderate pain.     . iron polysaccharides (NIFEREX) 150 MG capsule Take 150 mg by mouth daily.     [name] lisinopril (ZESTRIL) 2.5 MG tablet Take 2.5 mg by mouth daily.    . metFORMIN (GLUCOPHAGE) 1000 MG tablet Take 1,000 mg by mouth 2 (two) times daily.    . metoprolol succinate (TOPROL-XL) 25 MG 24 hr tablet TAKE 1 TABLET BY MOUTH EVERY DAY FOR BLOOD PRESSURE 90 tablet 3  . Multiple Vitamins-Minerals (MULTIVITAMIN WITH MINERALS) tablet Take 1 tablet by mouth daily.     [name] MYRBETRIQ 25 MG TB24 tablet TAKE 1 TABLET BY MOUTH EVERY DAY 90 tablet 3  . NIFEdipine (ADALAT CC) 30 MG 24 hr tablet TAKE 1 TABLET BY MOUTH EVERY DAY 90 tablet 2  . nitrofurantoin, macrocrystal-monohydrate, (MACROBID) 100 MG capsule Take by mouth.    [name] omeprazole (PRILOSEC) 20 MG capsule TAKE ONE CAPSULE BY MOUTH EVERY DAY 90 capsule 2  . XTANDI 40 MG capsule TAKE 2 CAPSULES (  80 MG TOTAL) BY MOUTH DAILY. 60 capsule 3   No current facility-administered medications for this visit.    PHYSICAL EXAMINATION: ECOG PERFORMANCE STATUS: 1 - Symptomatic but completely ambulatory  BP 125/75 (BP Location: Right Arm, Patient Position: Sitting, Cuff Size: Normal)   Pulse 79   Temp (!) 96 F (35.6 C) (Tympanic)   Resp 16   Ht 5' 6" (1.676 m)   Wt 132 lb (59.9 kg)   SpO2 100%   BMI 21.31 kg/m   Filed Weights   01/23/20 0830  Weight: 132 lb (59.9 kg)    Physical Exam Constitutional:      Comments: Accompanied by his daughter.  Walking himself.  HENT:     Head: Normocephalic and atraumatic.     Mouth/Throat:     Pharynx: No oropharyngeal exudate.  Eyes:      Pupils: Pupils are equal, round, and reactive to light.  Cardiovascular:     Rate and Rhythm: Normal rate and regular rhythm.  Pulmonary:     Effort: Pulmonary effort is normal. No respiratory distress.     Breath sounds: Normal breath sounds. No wheezing.  Abdominal:     General: Bowel sounds are normal. There is no distension.     Palpations: Abdomen is soft. There is no mass.     Tenderness: There is no abdominal tenderness. There is no guarding or rebound.  Musculoskeletal:        General: No tenderness. Normal range of motion.     Cervical back: Normal range of motion and neck supple.  Skin:    General: Skin is warm.  Neurological:     Mental Status: He is alert and oriented to person, place, and time.  Psychiatric:        Mood and Affect: Affect normal.      LABORATORY DATA:  I have reviewed the data as listed    Component Value Date/Time   NA 133 (L) 01/23/2020 0758   NA 136 05/07/2012 1328   K 4.5 01/23/2020 0758   K 4.5 05/07/2012 1328   CL 98 01/23/2020 0758   CL 104 05/07/2012 1328   CO2 27 01/23/2020 0758   CO2 28 05/07/2012 1328   GLUCOSE 248 (H) 01/23/2020 0758   GLUCOSE 73 05/07/2012 1328   BUN 21 01/23/2020 0758   BUN 16 05/07/2012 1328   CREATININE 1.07 01/23/2020 0758   CREATININE 0.84 05/07/2012 1328   CALCIUM 9.3 01/23/2020 0758   CALCIUM 8.8 05/07/2012 1328   PROT 8.1 01/23/2020 0758   ALBUMIN 4.1 01/23/2020 0758   AST 22 01/23/2020 0758   ALT 15 01/23/2020 0758   ALKPHOS 51 01/23/2020 0758   BILITOT 0.8 01/23/2020 0758   GFRNONAA >60 01/23/2020 0758   GFRNONAA >60 05/07/2012 1328   GFRAA >60 11/17/2019 0819   GFRAA >60 05/07/2012 1328    No results found for: SPEP, UPEP  Lab Results  Component Value Date   WBC 5.8 01/23/2020   NEUTROABS 3.3 01/23/2020   HGB 12.5 (L) 01/23/2020   HCT 37.4 (L) 01/23/2020   MCV 91.9 01/23/2020   PLT 229 01/23/2020      Chemistry      Component Value Date/Time   NA 133 (L) 01/23/2020 0758   NA  136 05/07/2012 1328   K 4.5 01/23/2020 0758   K 4.5 05/07/2012 1328   CL 98 01/23/2020 0758   CL 104 05/07/2012 1328   CO2 27 01/23/2020 0758   CO2 28 05/07/2012 1328  BUN 21 01/23/2020 0758   BUN 16 05/07/2012 1328   CREATININE 1.07 01/23/2020 0758   CREATININE 0.84 05/07/2012 1328      Component Value Date/Time   CALCIUM 9.3 01/23/2020 0758   CALCIUM 8.8 05/07/2012 1328   ALKPHOS 51 01/23/2020 0758   AST 22 01/23/2020 0758   ALT 15 01/23/2020 0758   BILITOT 0.8 01/23/2020 0758       RADIOGRAPHIC STUDIES: I have personally reviewed the radiological images as listed and agreed with the findings in the report. No results found.   ASSESSMENT & PLAN:  Prostate cancer [facility]) # Metastatic prostate cancer/ castrate sensitive [bulky retroperitoneal adenopathy; invasion into the bladder; rectum]. Eligard [54mq 646mlast 03/10/2019; On Xtandi; February 2020-CT scan shows continued significant improvement of the retroperitoneal adenopathy; bladder/rectal invasion.;  Diffuse diffuse osseous metastatic disease- STABLE.    #Continue Xtandi 80 mg a day [secondary to fatigue]; SEP 2021--STABLE/  0.03.   # Anemia-iron deficiency-hemoglobin 12.2-STABLE;  on PO iron  # DM-type II/insulin-STABLE; BG-248- post BF.   # COVID BOOSTER: Again reminded, discussed given patient's diagnosis and other comorbidities/therapies-patient would be considered immunocompromised.  As per CDC recommendation/FDA approval-I would recommend booster vaccine.  # DISPOSITION:  # Follow up in 2 month-MD/labs-cbc/cmp/psa; ELIGARD [[place]/friday early AM appts]Dr.B    No orders of the defined types were placed in this encounter.  All questions were answered. The patient knows to call the clinic with any problems, questions or concerns.      GoCammie SickleMD 01/23/2020 11:42 AM

## 2020-01-23 NOTE — Progress Notes (Signed)
Hi - PSA is 0.05/ over all stable. Continue current therapy; no changes. Folow up as planned.

## 2020-01-29 ENCOUNTER — Ambulatory Visit: Payer: Medicare Other | Admitting: Cardiovascular Disease

## 2020-02-02 ENCOUNTER — Other Ambulatory Visit: Payer: Self-pay | Admitting: Internal Medicine

## 2020-02-02 DIAGNOSIS — C61 Malignant neoplasm of prostate: Secondary | ICD-10-CM

## 2020-02-02 NOTE — Telephone Encounter (Signed)
Contains abnormal dataCBC with Differential/Platelet Order: [id number]  Status: Final result   Visible to patient: Yes (not seen)   Next appt: 03/18/2020 at 04:00 PM in Cardiology [name], MD)   Dx: Prostate cancer [facility])   1 Result Note   1 Patient Communication  Component Ref Range & Units 10 d ago 2 mo ago 4 mo ago 6 mo ago 8 mo ago 10 mo ago 1 yr ago  WBC 4.0 - 10.5 K/uL 5.8  6.1  6.2  5.7  6.5  5.7  6.8   RBC 4.22 - 5.81 MIL/uL 4.07Low  3.96Low  3.88Low  3.80Low  4.14Low  3.96Low  3.93Low   Hemoglobin 13.0 - 17.0 g/dL 12.5Low  12.2Low  12.1Low  11.6Low  12.7Low  12.1Low  12.2Low   HCT 39.0 - 52.0 % 37.4Low  36.5Low  36.1Low  35.0Low  38.2Low  37.8Low  36.9Low   MCV 80.0 - 100.0 fL 91.9  92.2  93.0  92.1  92.3  95.5  93.9   MCH 26.0 - 34.0 pg 30.7  30.8  31.2  30.5  30.7  30.6  31.0   MCHC 30.0 - 36.0 g/dL 33.4  33.4  33.5  33.1  33.2  32.0  33.1   RDW 11.5 - 15.5 % 12.3  12.6  12.7  12.4  12.2  12.0  12.3   Platelets 150 - 400 K/uL 229  223  238  213  249  243  239   nRBC 0.0 - 0.2 % 0.0  0.0  0.0  0.0  0.0  0.0  0.0   Neutrophils Relative % % 57  63  58  55  64  61  59   Neutro Abs 1.7 - 7.7 K/uL 3.3  3.9  3.7  3.2  4.2  3.5  4.0   Lymphocytes Relative % 30  25  27  29  24  23  27    Lymphs Abs 0.7 - 4.0 K/uL 1.7  1.6  1.7  1.6  1.6  1.3  1.9   Monocytes Relative % 10  9  11  13  9  13  12    Monocytes Absolute 0.1 - 1.0 K/uL 0.6  0.6  0.7  0.7  0.6  0.7  0.8   Eosinophils Relative % 1  1  2  2  2  1  1    Eosinophils Absolute 0.0 - 0.5 K/uL 0.1  0.1  0.1  0.1  0.1  0.1  0.1   Basophils Relative % 1  1  1  1   0  1  0   Basophils Absolute 0.0 - 0.1 K/uL 0.0  0.0  0.0  0.0  0.0  0.0  0.0   Immature Granulocytes % 1  1  1   0  1  1  1    Abs Immature Granulocytes 0.00 - 0.07 K/uL 0.04  0.04 CM  0.03 CM  0.02 CM  0.03 CM  0.03 CM  0.04 CM   Comment: Performed at [facility], [address] [place], [place] [address]   Resulting Agency  [facility] CL[facility] LAB [place] CL[facility] [place] [facility] LAB [place] CL[facility] [place] [facility] [place] [facility] CL[facility] LAB         Specimen Collected: 01/23/20 07:58 Last Resulted: 01/23/20 08:14     Lab Flowsheet   Order Details   View Encounter   Lab and Collection Details   Routing   Result History  CM=Additional comments     Result Care Coordination   Result Notes   [name], MD  01/23/2020 4:01 PM EST Back to Top     Hi - PSA is 0.05/ over all stable. Continue current therapy; no changes. Folow up as planned.     Patient Communication  Edit Comments Add Notifications Back to Top    Hi - PSA is 0.05/ over all stable. Continue current therapy; no changes. Folow up as planned.  Thanks GB  Written by [name], MD on 01/23/2020 4:01 PM EST       Other Results from 01/23/2020   PSA Order: [id number]  Status: Final result   Visible to patient: Yes (not seen)   Next appt: 03/18/2020 at 04:00 PM in Cardiology [name], MD)   Dx: Prostate cancer [facility])   1 Result Note   1 Patient Communication  Component Ref Range & Units 10 d ago 2 mo ago 4 mo ago 6 mo ago 8 mo ago 10 mo ago 1 yr ago  Prostatic Specific Antigen 0.00 - 4.00 ng/mL 0.05  0.03 CM  0.06 CM  0.04 CM  0.07 CM  0.08 CM  0.10 CM   Comment: (NOTE)  While PSA levels of <=4.0 ng/ml are reported as reference range, some  men with levels below 4.0 ng/ml can have prostate cancer and many men  with PSA above 4.0 ng/ml do not have prostate cancer. Other tests  such as free PSA, age specific reference ranges, PSA velocity and PSA  doubling time may be helpful especially in men less than 42 years  old.  Performed at [facility], [place] [address], [place], [place]  [address]   Resulting Agency  [facility] CLIN LAB [place] CL[facility] [place] CL[facility] [place] CL[facility] [place] [facility] [place] [facility] CL[facility] LAB         Specimen Collected: 01/23/20 07:58 Last Resulted: 01/23/20 13:13     Lab Flowsheet    Order Details   View Encounter   Lab and Collection Details   Routing   Result History     CM=Additional comments     Result Care Coordination   Result Notes   [name], MD  01/23/2020 4:01 PM EST Back to Top     Hi - PSA is 0.05/ over all stable. Continue current therapy; no changes. Folow up as planned.     Patient Communication  Edit Comments Add Notifications Back to Top    Hi - PSA is 0.05/ over all stable. Continue current therapy; no changes. Folow up as planned.  Thanks GB  Written by [name], MD on 01/23/2020 4:01 PM EST        Contains abnormal dataComprehensive metabolic panel Order: [id number]  Status: Final result    Visible to patient: Yes (not seen)    Next appt: 03/18/2020 at 04:00 PM in Cardiology [name], MD)    Dx: Prostate cancer [facility])    1 Result Note    1 Patient Communication   Component Ref Range & Units 10 d ago 2 mo ago 4 mo ago 6 mo ago 8 mo ago 10 mo ago 1 yr ago  Sodium 135 - 145 mmol/L 133Low  135  135  135  133Low  133Low  137   Potassium 3.5 - 5.1 mmol/L 4.5  4.4  4.5  4.7  4.5  4.1  4.2   Chloride 98 - 111 mmol/L 98  101  101  102  100  100  102   CO2 22 - 32 mmol/L 27  26  28  25  25  25  25    Glucose, Bld 70 - 99 mg/dL 248High  180High CM  132High CM  142High CM  214High CM  216High  115High   Comment: Glucose reference range applies only to samples taken after fasting for at least 8 hours.  BUN 8 - 23 mg/dL 21  16  21  19  17  16  18    Creatinine, Ser 0.61 - 1.24 mg/dL 1.07  1.10  1.11  1.06  1.05  0.99  1.04   Calcium 8.9 - 10.3 mg/dL 9.3  9.0  9.2  9.0  9.2  9.2  9.4   Total Protein 6.5 - 8.1 g/dL 8.1  7.6  7.9  7.3  7.8  8.1  8.0   Albumin 3.5 - 5.0 g/dL 4.1  4.1  4.1  4.0  4.2  4.1  4.2   AST 15 - 41 U/L 22  20  22  19  19  21  20    ALT 0 - 44 U/L 15  13  14  14  14  15  16    Alkaline Phosphatase 38 - 126 U/L 51  54  58  55  60  68  59   Total  Bilirubin 0.3 - 1.2 mg/dL 0.8  0.6  0.7  0.6  0.7  0.5  0.5   GFR, Estimated >60 mL/min >60         Comment: (NOTE)  Calculated using the CKD-EPI Creatinine Equation (2021)   Anion gap 5 - 15 8  8  CM  6 CM  8 CM  8 CM  8 CM  10 CM   Comment: Performed at [facility], [address] [place], [place] [address]  Resulting Agency  [facility] CLIN LAB [place] CLIN LAB [place] CL[facility] [place] CLIN LAB [place] CL[facility] LAB [place] [facility] [place] [facility]          Specimen Collected: 01/23/20 07:58 Last Resulted: 01/23/20 08:23     Lab Flowsheet    Order Details    View Encounter    Lab and Collection Details    Routing    Result History      CM=Additional comments     Result Care Coordination   Result Notes   [name], MD  01/23/2020 4:01 PM EST Back to Top     Hi - PSA is 0.05/ over all stable. Continue current therapy; no changes. Folow up as planned.     Patient Communication  Edit Comments Add Notifications Back to Top    Hi - PSA is 0.05/ over all stable. Continue current therapy; no changes. Folow up as planned.  Thanks GB  Written by [name], MD on 01/23/2020 4:01 PM EST

## 2020-02-04 MED FILL — XTANDI 40 MG CAPSULE: 40 | 30 days supply | Qty: 60 | Fill #0

## 2020-03-04 MED FILL — XTANDI 40 MG CAPSULE: 40 | 30 days supply | Qty: 60 | Fill #1

## 2020-03-18 ENCOUNTER — Ambulatory Visit: Payer: Medicare Other | Admitting: Cardiovascular Disease

## 2020-03-25 ENCOUNTER — Other Ambulatory Visit: Payer: Self-pay

## 2020-03-25 DIAGNOSIS — C61 Malignant neoplasm of prostate: Secondary | ICD-10-CM

## 2020-03-26 ENCOUNTER — Other Ambulatory Visit: Payer: Self-pay

## 2020-03-26 ENCOUNTER — Inpatient Hospital Stay (HOSPITAL_BASED_OUTPATIENT_CLINIC_OR_DEPARTMENT_OTHER): Payer: Medicare Other | Admitting: Internal Medicine

## 2020-03-26 ENCOUNTER — Inpatient Hospital Stay: Payer: Medicare Other

## 2020-03-26 ENCOUNTER — Inpatient Hospital Stay: Payer: Medicare Other | Attending: Internal Medicine

## 2020-03-26 ENCOUNTER — Encounter: Payer: Self-pay | Admitting: Internal Medicine

## 2020-03-26 VITALS — BP 153/90 | HR 73 | Temp 95.5°F | Resp 16 | Ht 66.0 in | Wt 125.0 lb

## 2020-03-26 DIAGNOSIS — Z191 Hormone sensitive malignancy status: Secondary | ICD-10-CM | POA: Insufficient documentation

## 2020-03-26 DIAGNOSIS — C61 Malignant neoplasm of prostate: Secondary | ICD-10-CM | POA: Diagnosis present

## 2020-03-26 DIAGNOSIS — E1165 Type 2 diabetes mellitus with hyperglycemia: Secondary | ICD-10-CM | POA: Diagnosis not present

## 2020-03-26 DIAGNOSIS — D649 Anemia, unspecified: Secondary | ICD-10-CM | POA: Diagnosis not present

## 2020-03-26 DIAGNOSIS — E119 Type 2 diabetes mellitus without complications: Secondary | ICD-10-CM | POA: Insufficient documentation

## 2020-03-26 DIAGNOSIS — I10 Essential (primary) hypertension: Secondary | ICD-10-CM | POA: Insufficient documentation

## 2020-03-26 DIAGNOSIS — I7300 Raynaud's syndrome without gangrene: Secondary | ICD-10-CM | POA: Insufficient documentation

## 2020-03-26 DIAGNOSIS — K219 Gastro-esophageal reflux disease without esophagitis: Secondary | ICD-10-CM | POA: Diagnosis not present

## 2020-03-26 DIAGNOSIS — E785 Hyperlipidemia, unspecified: Secondary | ICD-10-CM | POA: Diagnosis not present

## 2020-03-26 DIAGNOSIS — D509 Iron deficiency anemia, unspecified: Secondary | ICD-10-CM | POA: Diagnosis not present

## 2020-03-26 DIAGNOSIS — Z794 Long term (current) use of insulin: Secondary | ICD-10-CM | POA: Insufficient documentation

## 2020-03-26 DIAGNOSIS — I2510 Atherosclerotic heart disease of native coronary artery without angina pectoris: Secondary | ICD-10-CM | POA: Insufficient documentation

## 2020-03-26 DIAGNOSIS — Z79899 Other long term (current) drug therapy: Secondary | ICD-10-CM | POA: Insufficient documentation

## 2020-03-26 LAB — IRON AND TIBC
Iron: 121 ug/dL (ref 45–182)
Saturation Ratios: 35 % (ref 17.9–39.5)
TIBC: 343 ug/dL (ref 250–450)
UIBC: 222 ug/dL

## 2020-03-26 LAB — CBC WITH DIFFERENTIAL/PLATELET
Abs Immature Granulocytes: 0.05 10*3/uL (ref 0.00–0.07)
Basophils Absolute: 0 10*3/uL (ref 0.0–0.1)
Basophils Relative: 0 %
Eosinophils Absolute: 0 10*3/uL (ref 0.0–0.5)
Eosinophils Relative: 1 %
HCT: 34.9 % — ABNORMAL LOW (ref 39.0–52.0)
Hemoglobin: 11.9 g/dL — ABNORMAL LOW (ref 13.0–17.0)
Immature Granulocytes: 1 %
Lymphocytes Relative: 33 %
Lymphs Abs: 1.6 10*3/uL (ref 0.7–4.0)
MCH: 30.9 pg (ref 26.0–34.0)
MCHC: 34.1 g/dL (ref 30.0–36.0)
MCV: 90.6 fL (ref 80.0–100.0)
Monocytes Absolute: 0.6 10*3/uL (ref 0.1–1.0)
Monocytes Relative: 12 %
Neutro Abs: 2.6 10*3/uL (ref 1.7–7.7)
Neutrophils Relative %: 53 %
Platelets: 253 10*3/uL (ref 150–400)
RBC: 3.85 MIL/uL — ABNORMAL LOW (ref 4.22–5.81)
RDW: 12.7 % (ref 11.5–15.5)
WBC: 4.8 10*3/uL (ref 4.0–10.5)
nRBC: 0 % (ref 0.0–0.2)

## 2020-03-26 LAB — COMPREHENSIVE METABOLIC PANEL
ALT: 12 U/L (ref 0–44)
AST: 19 U/L (ref 15–41)
Albumin: 3.7 g/dL (ref 3.5–5.0)
Alkaline Phosphatase: 54 U/L (ref 38–126)
Anion gap: 9 (ref 5–15)
BUN: 18 mg/dL (ref 8–23)
CO2: 25 mmol/L (ref 22–32)
Calcium: 9.2 mg/dL (ref 8.9–10.3)
Chloride: 101 mmol/L (ref 98–111)
Creatinine, Ser: 0.91 mg/dL (ref 0.61–1.24)
GFR, Estimated: >60 mL/min (ref >60)
Glucose, Bld: 199 mg/dL — ABNORMAL HIGH (ref 70–99)
Potassium: 4.3 mmol/L (ref 3.5–5.1)
Sodium: 135 mmol/L (ref 135–145)
Total Bilirubin: 0.5 mg/dL (ref 0.3–1.2)
Total Protein: 7.6 g/dL (ref 6.5–8.1)

## 2020-03-26 LAB — PSA: Prostatic Specific Antigen: 0.06 ng/mL (ref 0.00–4.00)

## 2020-03-26 LAB — FERRITIN: Ferritin: 52 ng/mL (ref 24–336)

## 2020-03-26 MED ORDER — LEUPROLIDE ACETATE (6 MONTH) 45 MG ~~LOC~~ KIT
45.0000 mg | PACK | Freq: Once | SUBCUTANEOUS | Status: AC
  Administered 2020-03-26: 45 mg via SUBCUTANEOUS
  Filled 2020-03-26: qty 45

## 2020-03-26 NOTE — Assessment & Plan Note (Addendum)
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 Eligard [9m q 78m; last 03/10/2019; On Xtandi; February 2020-CT scan shows continued significant improvement of the retroperitoneal adenopathy; bladder/rectal invasion.;  Diffuse diffuse osseous metastatic disease- STABLE.    #Continue Xtandi 80 mg a day [secondary to fatigue]; DEC2021--STABLE;  0.05.   # Anemia-iron deficiency-hemoglobin 11-12- STABLE;  on PO iron; add iron studies/ferritin.   # DM-type II/insulin-STABLE; BG-248- post BF.   # DISPOSITION: add iron/ferriin # Eligard today # Follow up in 2 month-MD/labs-cbc/cmp/psa;  [Monda/friday early AM appts]Dr.B

## 2020-03-26 NOTE — Addendum Note (Signed)
Addended by: Delice Bison E on: 03/26/2020 09:16 AM   Modules accepted: Orders

## 2020-03-26 NOTE — Progress Notes (Signed)
[place] OFFICE PROGRESS NOTE  Patient Care Team: [name], MD as PCP - General (Internal Medicine) Wellington [name], MD as PCP - Cardiology (Cardiology)  Cancer Staging No matching staging information was found for the patient.   Oncology History Overview Note  # FEB 2019-Metastatic prostate cancer/ castrate sensitive [bulky retroperitoneal adenopathy; invasion into the bladder; rectum] Bladder Bx- prostate. On Degarelix  # April 3rd 2019- [name] [152md];  Lupron q 6 M [[id number]]; MID AUG 2019- reduced dose to 820mday  # Poorly controlled DM-on insulin  # s/p right PCN [explanted]/ Foley cath [Dr.Brandon]; multiple UTIs. [s/p ID; Dr.Ravishankar]  ----------------------------------------------------------------- DIAGNOSIS: [ FEB 2019] MET. PROSTATE CA  STAGE:   IV ;GOALS: PALLIATIVE  CURRENT/MOST RECENT THERAPY [ Feb-April2019] Lupron+ [name]    [facility]     INTERVAL HISTORY:  [name]  male pleasant patient [name] is here for follow-up.  No worsening back pain.  Denies any recent UTIs.  No new shortness of breath or cough.  Possible exposure to Covid however not symptomatic.  Patient is boosted.  Review of Systems  Constitutional: Positive for malaise/fatigue. Negative for chills, diaphoresis, fever and weight loss.  HENT: Negative for nosebleeds and sore throat.   Eyes: Negative for double vision.  Respiratory: Negative for cough, hemoptysis, sputum production and wheezing.   Cardiovascular: Negative for chest pain, palpitations, orthopnea and leg swelling.  Gastrointestinal: Negative for abdominal pain, blood in stool, constipation, diarrhea, heartburn, melena, nausea and vomiting.  Musculoskeletal: Positive for back pain and joint pain.  Skin: Negative.  Negative for itching and rash.  Neurological: Negative for dizziness,  tingling, focal weakness, weakness and headaches.  Endo/Heme/Allergies: Does not bruise/bleed easily.  Psychiatric/Behavioral: Negative for depression. The patient is not nervous/anxious and does not have insomnia.       PAST MEDICAL HISTORY :  Past Medical History:  Diagnosis Date  . Anemia    iron deficiency  . [place] ([place]  . Bilateral carotid artery disease (HCC)    Mild plaque formation without obstructive disease noted on carotid Doppler.  . Diabetes mellitus without complication ([facility]  . Essential hypertension   . GERD (gastroesophageal reflux disease)   . Hyperlipidemia   . Hypertension   . Left carotid artery stenosis   . Nonobstructive Coronary artery disease    a. [facility]. The patient was told about 2 blockages which did not require revascularization; b. 01/2015 Cath: LM nl, LAD 10p/m, 4515m D2 30ost, LCX nl, RCA min irregs, EF 55-65%; b. 12/2018 MV: No ischemia/infarct. CT images w/ mod 3 vessel Cor Ca2+ and mild-mod Ao atherosclerosis.  . Prostate cancer ([facility]  . Raynaud disease    a. Managed w/ nifedipine.    PAST SURGICAL HISTORY :   Past Surgical History:  Procedure Laterality Date  . CARDIAC CATHETERIZATION  2010  . CARDIAC CATHETERIZATION N/A 02/03/2015   Procedure: Left Heart Cath and Coronary Angiography;  Surgeon: MuhWellington HampshireD;  Location: [facility];  Service: Cardiovascular;  Laterality: N/A;  . CATARACT EXTRACTION Bilateral   . CYSTOSCOPY W/ RETROGRADES Bilateral 04/11/2017   Procedure: CYSTOSCOPY WITH RETROGRADE PYELOGRAM;  Surgeon: BraHollice EspyD;  Location: [facility] ORS;  Service: Urology;  Laterality: Bilateral;  . CYSTOSCOPY W/ RETROGRADES Bilateral 09/12/2017   Procedure: CYSTOSCOPY WITH RETROGRADE PYELOGRAM;  Surgeon: BraHollice EspyD;  Location: [facility] OR[facility];  Service: Urology;  Laterality: Bilateral;  . CYSTOSCOPY W/ RETROGRADES Bilateral 02/25/2018   Procedure:  CYSTOSCOPY WITH RETROGRADE PYELOGRAM;  Surgeon: [name], MD;  Location: [facility] OR[facility];  Service: Urology;  Laterality: Bilateral;  . CYSTOSCOPY W/ URETERAL STENT PLACEMENT Bilateral 09/12/2017   Procedure: CYSTOSCOPY WITH STENT REPLACEMENT;  Surgeon: [name], MD;  Location: [facility] OR[facility];  Service: Urology;  Laterality: Bilateral;  . CYSTOSCOPY W/ URETERAL STENT REMOVAL Bilateral 02/25/2018   Procedure: CYSTOSCOPY WITH STENT REMOVAL;  Surgeon: [name], MD;  Location: [facility] OR[facility];  Service: Urology;  Laterality: Bilateral;  . CYSTOSCOPY WITH STENT PLACEMENT Left 04/11/2017   Procedure: CYSTOSCOPY WITH STENT PLACEMENT and fulgeration;  Surgeon: [name], MD;  Location: [facility] OR[facility];  Service: Urology;  Laterality: Left;  . CYSTOSCOPY WITH URETHRAL DILATATION Bilateral 04/11/2017   Procedure: CYSTOSCOPY WITH URETHRAL DILATATION;  Surgeon: [name], MD;  Location: [facility];  Service: Urology;  Laterality: Bilateral;  . IR CONVERT RIGHT NEPHROSTOMY TO NEPHROURETERAL CATH  05/21/2017  . IR NEPHROSTOMY PLACEMENT RIGHT  04/13/2017    FAMILY HISTORY :   Family History  Problem Relation Age of Onset  . Hypertension Father     SOCIAL HISTORY:   Social History   Tobacco Use  . Smoking status: Never Smoker  . Smokeless tobacco: Never Used  Vaping Use  . Vaping Use: Never used  Substance Use Topics  . Alcohol use: No  . Drug use: No    ALLERGIES:  has No Known Allergies.  MEDICATIONS:  Current Outpatient Medications  Medication Sig Dispense Refill  . acetaminophen (TYLENOL) 500 MG tablet Take 500 mg by mouth every 4 (four) hours as needed for moderate pain or fever.    [name] albuterol (PROVENTIL HFA;VENTOLIN HFA) 108 (90 Base) MCG/ACT inhaler Inhale 2 puffs into the lungs every 4 (four) hours as needed for wheezing or shortness of breath.    [name] atorvastatin (LIPITOR) 10 MG tablet Take 10 mg by mouth daily at 6 PM.     . Calcium Carb-Cholecalciferol (CALCIUM 600+D3) 600-800 MG-UNIT  TABS Take 1 tablet by mouth every evening.     . Calcium Carbonate-Vitamin D 600-200 MG-UNIT TABS Take by mouth.    [name] glimepiride (AMARYL) 1 MG tablet Take by mouth.    [name] glucose blood (ACCU-CHEK GUIDE) test strip Use asdirected three times a day diag E11.65 100 each 3  . ibuprofen (ADVIL,MOTRIN) 200 MG tablet Take 200 mg by mouth every 6 (six) hours as needed for fever or moderate pain.     . iron polysaccharides (NIFEREX) 150 MG capsule Take 150 mg by mouth daily.     [name] lisinopril (ZESTRIL) 2.5 MG tablet Take 2.5 mg by mouth daily.    . metFORMIN (GLUCOPHAGE) 1000 MG tablet Take 1,000 mg by mouth 2 (two) times daily.    . metoprolol succinate (TOPROL-XL) 25 MG 24 hr tablet TAKE 1 TABLET BY MOUTH EVERY DAY FOR BLOOD PRESSURE 90 tablet 3  . Multiple Vitamins-Minerals (MULTIVITAMIN WITH MINERALS) tablet Take 1 tablet by mouth daily.     [name] MYRBETRIQ 25 MG TB24 tablet TAKE 1 TABLET BY MOUTH EVERY DAY 90 tablet 3  . NIFEdipine (ADALAT CC) 30 MG 24 hr tablet TAKE 1 TABLET BY MOUTH EVERY DAY 90 tablet 2  . omeprazole (PRILOSEC) 20 MG capsule TAKE ONE CAPSULE BY MOUTH EVERY DAY 90 capsule 2  . XTANDI 40 MG capsule TAKE 2 CAPSULES (80 MG TOTAL) BY MOUTH DAILY. 60 capsule 3  . glimepiride (AMARYL) 2 MG  tablet Take 2 mg by mouth daily with breakfast.     No current facility-administered medications for this visit.    PHYSICAL EXAMINATION: ECOG PERFORMANCE STATUS: 1 - Symptomatic but completely ambulatory  BP (!) 153/90 (BP Location: Left Arm, Patient Position: Sitting, Cuff Size: Normal)   Pulse 73   Temp (!) 95.5 F (35.3 C) (Tympanic)   Resp 16   Ht '5\' 6"'  (1.676 m)   Wt 125 lb (56.7 kg)   SpO2 100%   BMI 20.18 kg/m   Filed Weights   03/26/20 0828  Weight: 125 lb (56.7 kg)    Physical Exam Constitutional:      Comments: Accompanied by his daughter.  Walking himself.  HENT:     Head: Normocephalic and atraumatic.     Mouth/Throat:     Pharynx: No oropharyngeal exudate.  Eyes:      Pupils: Pupils are equal, round, and reactive to light.  Cardiovascular:     Rate and Rhythm: Normal rate and regular rhythm.  Pulmonary:     Effort: Pulmonary effort is normal. No respiratory distress.     Breath sounds: Normal breath sounds. No wheezing.  Abdominal:     General: Bowel sounds are normal. There is no distension.     Palpations: Abdomen is soft. There is no mass.     Tenderness: There is no abdominal tenderness. There is no guarding or rebound.  Musculoskeletal:        General: No tenderness. Normal range of motion.     Cervical back: Normal range of motion and neck supple.  Skin:    General: Skin is warm.  Neurological:     Mental Status: He is alert and oriented to person, place, and time.  Psychiatric:        Mood and Affect: Affect normal.      LABORATORY DATA:  I have reviewed the data as listed    Component Value Date/Time   NA 135 03/26/2020 0821   NA 136 05/07/2012 1328   K 4.3 03/26/2020 0821   K 4.5 05/07/2012 1328   CL 101 03/26/2020 0821   CL 104 05/07/2012 1328   CO2 25 03/26/2020 0821   CO2 28 05/07/2012 1328   GLUCOSE 199 (H) 03/26/2020 0821   GLUCOSE 73 05/07/2012 1328   BUN 18 03/26/2020 0821   BUN 16 05/07/2012 1328   CREATININE 0.91 03/26/2020 0821   CREATININE 0.84 05/07/2012 1328   CALCIUM 9.2 03/26/2020 0821   CALCIUM 8.8 05/07/2012 1328   PROT 7.6 03/26/2020 0821   ALBUMIN 3.7 03/26/2020 0821   AST 19 03/26/2020 0821   ALT 12 03/26/2020 0821   ALKPHOS 54 03/26/2020 0821   BILITOT 0.5 03/26/2020 0821   GFRNONAA >60 03/26/2020 0821   GFRNONAA >60 05/07/2012 1328   GFRAA >60 11/17/2019 0819   GFRAA >60 05/07/2012 1328    No results found for: SPEP, UPEP  Lab Results  Component Value Date   WBC 4.8 03/26/2020   NEUTROABS 2.6 03/26/2020   HGB 11.9 (L) 03/26/2020   HCT 34.9 (L) 03/26/2020   MCV 90.6 03/26/2020   PLT 253 03/26/2020      Chemistry      Component Value Date/Time   NA 135 03/26/2020 0821   NA 136  05/07/2012 1328   K 4.3 03/26/2020 0821   K 4.5 05/07/2012 1328   CL 101 03/26/2020 0821   CL 104 05/07/2012 1328   CO2 25 03/26/2020 0821   CO2 28 05/07/2012  1328   BUN 18 03/26/2020 0821   BUN 16 05/07/2012 1328   CREATININE 0.91 03/26/2020 0821   CREATININE 0.84 05/07/2012 1328      Component Value Date/Time   CALCIUM 9.2 03/26/2020 0821   CALCIUM 8.8 05/07/2012 1328   ALKPHOS 54 03/26/2020 0821   AST 19 03/26/2020 0821   ALT 12 03/26/2020 0821   BILITOT 0.5 03/26/2020 0037       RADIOGRAPHIC STUDIES: I have personally reviewed the radiological images as listed and agreed with the findings in the report. No results found.   ASSESSMENT & PLAN:  Prostate cancer [facility]) # Metastatic prostate cancer/ castrate sensitive [bulky retroperitoneal adenopathy; invasion into the bladder; rectum]. Eligard [71mq 620mlast 03/10/2019; On Xtandi; February 2020-CT scan shows continued significant improvement of the retroperitoneal adenopathy; bladder/rectal invasion.;  Diffuse diffuse osseous metastatic disease- STABLE.    #Continue Xtandi 80 mg a day [secondary to fatigue]; DEC2021--STABLE;  0.05.   # Anemia-iron deficiency-hemoglobin 11-12- STABLE;  on PO iron; add iron studies/ferritin.   # DM-type II/insulin-STABLE; BG-248- post BF.   # DISPOSITION: add iron/ferriin # Eligard today # Follow up in 2 month-MD/labs-cbc/cmp/psa;  [[place]onda/friday early AM appts]Dr.B    Orders Placed This Encounter  Procedures  . Iron and TIBC    Standing Status:   Future    Number of Occurrences:   1    Standing Expiration Date:   03/26/2021  . Ferritin    Standing Status:   Future    Number of Occurrences:   1    Standing Expiration Date:   03/26/2021   All questions were answered. The patient knows to call the clinic with any problems, questions or concerns.      GoCammie SickleMD 03/26/2020 9:09 AM

## 2020-03-28 ENCOUNTER — Other Ambulatory Visit (INDEPENDENT_AMBULATORY_CARE_PROVIDER_SITE_OTHER): Payer: Self-pay | Admitting: Nurse Practitioner

## 2020-04-22 NOTE — Progress Notes (Signed)
Cardiology Office Note   Date:  04/23/2020   ID:  Michael [name], DOB 07-20-1933, MRN [id number]  PCP:  [name], MD  Cardiologist:   [name], MD   Chief Complaint  Patient presents with  . 6 month follow up     "doing well." Medications reviewed by the patient verbally.       History of Present Illness: Michael [name] is a 87 y.o. male who presents for a follow-up visit regarding moderate calcified nonobstructive one-vessel coronary artery disease.  He has chronic medical conditions that include type 2 diabetes, hypertension, mild carotid disease and  hyperlipidemia.  He was seen in 2016 for abnormal stress test.  Cardiac catheterization was done in December 2016 which showed 40-50% heavily calcified stenosis in the mid to distal LAD. Ejection fraction was normal and left ventricular end-diastolic pressure was mildly elevated. The patient has been treated with atorvastatin. The patient was diagnosed with prostate cancer in early 2019.  He is currently on hormone therapy.   Last year, he had worsening fatigue and exertional dyspnea and thus I evaluated him with a Lexiscan Myoview which showed no evidence of ischemia.  Echocardiogram showed normal LV systolic function with no significant valvular abnormalities.  He is doing well overall with no chest pain or worsening dyspnea.  No edema.   Past Medical History:  Diagnosis Date  . Anemia    iron deficiency  . Aortic atherosclerosis ([place])   . Bilateral carotid artery disease (HCC)    Mild plaque formation without obstructive disease noted on carotid Doppler.  . Diabetes mellitus without complication ([place])   . Essential hypertension   . GERD (gastroesophageal reflux disease)   . Hyperlipidemia   . Hypertension   . Left carotid artery stenosis   . Nonobstructive Coronary artery disease    a. Previous cardiac catheterization at [facility] in 2010. The patient was told about 2 blockages which did not require  revascularization; b. 01/2015 Cath: LM nl, LAD 10p/m, 12m/d, D2 30ost, LCX nl, RCA min irregs, EF 55-65%; b. 12/2018 MV: No ischemia/infarct. CT images w/ mod 3 vessel Cor Ca2+ and mild-mod Ao atherosclerosis.  . Prostate cancer ([place]) 03/2017  . Raynaud disease    a. Managed w/ nifedipine.    Past Surgical History:  Procedure Laterality Date  . CARDIAC CATHETERIZATION  2010  . CARDIAC CATHETERIZATION N/A 02/03/2015   Procedure: Left Heart Cath and Coronary Angiography;  Surgeon: Wellington [name], MD;  Location: [place] [facility];  Service: Cardiovascular;  Laterality: N/A;  . CATARACT EXTRACTION Bilateral   . CYSTOSCOPY W/ RETROGRADES Bilateral 04/11/2017   Procedure: CYSTOSCOPY WITH RETROGRADE PYELOGRAM;  Surgeon: [name], MD;  Location: [facility];  Service: Urology;  Laterality: Bilateral;  . CYSTOSCOPY W/ RETROGRADES Bilateral 09/12/2017   Procedure: CYSTOSCOPY WITH RETROGRADE PYELOGRAM;  Surgeon: [name], MD;  Location: [facility];  Service: Urology;  Laterality: Bilateral;  . CYSTOSCOPY W/ RETROGRADES Bilateral 02/25/2018   Procedure: CYSTOSCOPY WITH RETROGRADE PYELOGRAM;  Surgeon: [name], MD;  Location: [facility];  Service: Urology;  Laterality: Bilateral;  . CYSTOSCOPY W/ URETERAL STENT PLACEMENT Bilateral 09/12/2017   Procedure: CYSTOSCOPY WITH STENT REPLACEMENT;  Surgeon: [name], MD;  Location: [facility];  Service: Urology;  Laterality: Bilateral;  . CYSTOSCOPY W/ URETERAL STENT REMOVAL Bilateral 02/25/2018   Procedure: CYSTOSCOPY WITH STENT REMOVAL;  Surgeon: [name], MD;  Location: [facility];  Service: Urology;  Laterality: Bilateral;  . CYSTOSCOPY WITH STENT PLACEMENT Left 04/11/2017  Procedure: CYSTOSCOPY WITH STENT PLACEMENT and fulgeration;  Surgeon: [name], MD;  Location: [facility];  Service: Urology;  Laterality: Left;  . CYSTOSCOPY WITH URETHRAL DILATATION Bilateral 04/11/2017   Procedure: CYSTOSCOPY WITH URETHRAL DILATATION;  Surgeon:  [name], MD;  Location: [facility];  Service: Urology;  Laterality: Bilateral;  . IR CONVERT RIGHT NEPHROSTOMY TO NEPHROURETERAL CATH  05/21/2017  . IR NEPHROSTOMY PLACEMENT RIGHT  04/13/2017     Current Outpatient Medications  Medication Sig Dispense Refill  . acetaminophen (TYLENOL) 500 MG tablet Take 500 mg by mouth every 4 (four) hours as needed for moderate pain or fever.    [name] albuterol (PROVENTIL HFA;VENTOLIN HFA) 108 (90 Base) MCG/ACT inhaler Inhale 2 puffs into the lungs every 4 (four) hours as needed for wheezing or shortness of breath.    [name] atorvastatin (LIPITOR) 10 MG tablet Take 10 mg by mouth daily at 6 PM.     . Calcium Carb-Cholecalciferol (CALCIUM 600+D3) 600-800 MG-UNIT TABS Take 1 tablet by mouth every evening.     . Calcium Carbonate-Vitamin D 600-200 MG-UNIT TABS Take by mouth.    [name] glimepiride (AMARYL) 2 MG tablet Take 2 mg by mouth daily with breakfast.    . glucose blood (ACCU-CHEK GUIDE) test strip Use asdirected three times a day diag E11.65 100 each 3  . ibuprofen (ADVIL,MOTRIN) 200 MG tablet Take 200 mg by mouth every 6 (six) hours as needed for fever or moderate pain.     . iron polysaccharides (NIFEREX) 150 MG capsule Take 150 mg by mouth daily.     [name] lisinopril (ZESTRIL) 2.5 MG tablet Take 2.5 mg by mouth daily.    . metFORMIN (GLUCOPHAGE) 1000 MG tablet Take 1,000 mg by mouth 2 (two) times daily.    . metoprolol succinate (TOPROL-XL) 25 MG 24 hr tablet TAKE 1 TABLET BY MOUTH EVERY DAY FOR BLOOD PRESSURE 90 tablet 3  . Multiple Vitamins-Minerals (MULTIVITAMIN WITH MINERALS) tablet Take 1 tablet by mouth daily.     [name] MYRBETRIQ 25 MG TB24 tablet TAKE 1 TABLET BY MOUTH EVERY DAY 90 tablet 3  . NIFEdipine (ADALAT CC) 30 MG 24 hr tablet TAKE 1 TABLET BY MOUTH EVERY DAY 90 tablet 2  . omeprazole (PRILOSEC) 20 MG capsule TAKE ONE CAPSULE BY MOUTH EVERY DAY 90 capsule 2  . XTANDI 40 MG capsule TAKE 2 CAPSULES (80 MG TOTAL) BY MOUTH DAILY. 60 capsule 3   No current  facility-administered medications for this visit.    Allergies:   Patient has no known allergies.    Social History:  The patient  reports that he has never smoked. He has never used smokeless tobacco. He reports that he does not drink alcohol and does not use drugs.   Family History:  The patient's family history includes Hypertension in his father.    ROS:  Please see the history of present illness.   Otherwise, review of systems are positive for none.   All other systems are reviewed and negative.    PHYSICAL EXAM: VS:  BP 122/72 (BP Location: Left Arm, Patient Position: Sitting, Cuff Size: Normal)   Pulse 78   Ht 5\' 4"  (1.626 m)   Wt 128 lb 6 oz (58.2 kg)   SpO2 98%   BMI 22.04 kg/m  , BMI Body mass index is 22.04 kg/m. GEN: Well nourished, well developed, in no acute distress  HEENT: normal  Neck: no JVD, carotid bruits, or masses Cardiac: RRR; no murmurs, rubs, or gallops,no edema  Respiratory:  clear to auscultation bilaterally, normal work of breathing GI: soft, nontender, nondistended, + BS MS: no deformity or atrophy  Skin: warm and dry, no rash Neuro:  Strength and sensation are intact Psych: euthymic mood, full affect Radial pulses normal bilaterally.  EKG:  EKG is ordered today. The ekg ordered today demonstrates normal sinus rhythm with nonspecific T wave changes.   Recent Labs: 03/26/2020: ALT 12; BUN 18; Creatinine, Ser 0.91; Hemoglobin 11.9; Platelets 253; Potassium 4.3; Sodium 135    Lipid Panel No results found for: CHOL, TRIG, HDL, CHOLHDL, VLDL, LDLCALC, LDLDIRECT    Wt Readings from Last 3 Encounters:  04/23/20 128 lb 6 oz (58.2 kg)  03/26/20 125 lb (56.7 kg)  01/23/20 132 lb (59.9 kg)       No flowsheet data found.    ASSESSMENT AND PLAN:  1.  Coronary artery disease involving native coronary arteries without angina: He is doing very well overall.  Cardiac studies last year included a Lexiscan Myoview and echocardiogram and both of  them were unremarkable.  Continue medical therapy.      2.  Hyperlipidemia: Continue treatment with atorvastatin with a target LDL of less than 70.  I reviewed most recent lipid profile in November which showed an LDL of 59.  3.  Essential hypertension: Blood pressure is controlled on current medications.  4.  Diabetes mellitus: Reasonably controlled with most recent hemoglobin A1c of 7.7.  5.  Metastatic prostate cancer: Responded very well to treatment and currently on hormone therapy.    Disposition:   FU with me in 12 months.  Signed,  [name], MD  04/23/2020 8:47 AM    [facility]

## 2020-04-23 ENCOUNTER — Encounter: Payer: Self-pay | Admitting: Cardiovascular Disease

## 2020-04-23 ENCOUNTER — Other Ambulatory Visit: Payer: Self-pay

## 2020-04-23 ENCOUNTER — Ambulatory Visit (INDEPENDENT_AMBULATORY_CARE_PROVIDER_SITE_OTHER): Payer: Medicare Other | Admitting: Cardiovascular Disease

## 2020-04-23 VITALS — BP 122/72 | HR 78 | Ht 64.0 in | Wt 128.4 lb

## 2020-04-23 DIAGNOSIS — E785 Hyperlipidemia, unspecified: Secondary | ICD-10-CM | POA: Diagnosis not present

## 2020-04-23 DIAGNOSIS — I25118 Atherosclerotic heart disease of native coronary artery with other forms of angina pectoris: Secondary | ICD-10-CM

## 2020-04-23 DIAGNOSIS — I10 Essential (primary) hypertension: Secondary | ICD-10-CM

## 2020-04-23 NOTE — Patient Instructions (Signed)

## 2020-05-07 ENCOUNTER — Encounter (INDEPENDENT_AMBULATORY_CARE_PROVIDER_SITE_OTHER): Payer: Self-pay | Admitting: Nurse Practitioner

## 2020-05-07 ENCOUNTER — Ambulatory Visit (INDEPENDENT_AMBULATORY_CARE_PROVIDER_SITE_OTHER): Payer: Medicare Other

## 2020-05-07 ENCOUNTER — Ambulatory Visit: Payer: Medicare Other | Admitting: Urology

## 2020-05-07 ENCOUNTER — Ambulatory Visit (INDEPENDENT_AMBULATORY_CARE_PROVIDER_SITE_OTHER): Payer: Medicare Other | Admitting: Nurse Practitioner

## 2020-05-07 ENCOUNTER — Other Ambulatory Visit: Payer: Self-pay

## 2020-05-07 VITALS — BP 133/79 | HR 85 | Ht 66.0 in | Wt 129.0 lb

## 2020-05-07 DIAGNOSIS — I7300 Raynaud's syndrome without gangrene: Secondary | ICD-10-CM

## 2020-05-07 DIAGNOSIS — I10 Essential (primary) hypertension: Secondary | ICD-10-CM | POA: Diagnosis not present

## 2020-05-07 DIAGNOSIS — E785 Hyperlipidemia, unspecified: Secondary | ICD-10-CM | POA: Diagnosis not present

## 2020-05-07 DIAGNOSIS — I25118 Atherosclerotic heart disease of native coronary artery with other forms of angina pectoris: Secondary | ICD-10-CM

## 2020-05-12 ENCOUNTER — Ambulatory Visit (INDEPENDENT_AMBULATORY_CARE_PROVIDER_SITE_OTHER): Payer: Medicare Other | Admitting: Urology

## 2020-05-12 ENCOUNTER — Other Ambulatory Visit: Payer: Self-pay

## 2020-05-12 VITALS — BP 142/88 | HR 80 | Ht 66.0 in | Wt 130.0 lb

## 2020-05-12 DIAGNOSIS — N1330 Unspecified hydronephrosis: Secondary | ICD-10-CM

## 2020-05-12 DIAGNOSIS — I25118 Atherosclerotic heart disease of native coronary artery with other forms of angina pectoris: Secondary | ICD-10-CM

## 2020-05-12 DIAGNOSIS — R339 Retention of urine, unspecified: Secondary | ICD-10-CM

## 2020-05-12 DIAGNOSIS — R8271 Bacteriuria: Secondary | ICD-10-CM | POA: Diagnosis not present

## 2020-05-12 DIAGNOSIS — C61 Malignant neoplasm of prostate: Secondary | ICD-10-CM

## 2020-05-12 LAB — BLADDER SCAN AMB NON-IMAGING: Scan Result: 295

## 2020-05-12 MED ORDER — MIRABEGRON ER 25 MG PO TB24: 25.0000 mg | ORAL_TABLET | Freq: Every day | ORAL | 3 refills | Status: DC

## 2020-05-12 NOTE — Progress Notes (Signed)
Language line used, Interpreter # [id number]

## 2020-05-12 NOTE — Progress Notes (Signed)
05/12/2020 10:09 AM   [name] 1933/03/02 [id number]  Referring provider: [name], [facility] [place],  [place] [address]  No chief complaint on file.   HPI: Michael [name] is a 87 y.o. male who returns for a 6 months follow up of incomplete bladder emptying, prostate cancer, bilateral hydronephrosis, and chronic bacteria colonization. Patient is accompanied by son in [place], building services were used.   Previous history: His advanced metastatic prostate cancer is managed by Dr. Rogue Bussing on ADT/Xtandi and Delton See.  He was initially seen and evaluated during inpatient hospital admissionin 2/2019with urinary retention, acute kidney injury with bilateral hydronephrosis. He underwent biopsy of his bladder neck revealing prostate cancer and started on ADT. He required surgical intervention for Foley catheter placement given severe bladder neck contracture at which time a left ureteral stent was placed and ultimately a right PCN.Right PCN was later converted to indwelling double-J stent. His Foleywas subsequentlyremoved and he self cathing several times aweekto keep his bladder neck open (low PVRs).  CT abdomen pelvis with contrast on 07/2017 showsresponse to ADT with near complete resolution of metastatic abdominal and pelvic lymphadenopathy.  He returned to the operating room on 09/12/2017 for bilateral retrograde pyelogram and ultimately stent exchange due to poor excretion of contrast material the time of retrogrades.  Renal ultrasound on 03/08/2018 shows moderate bilateral hydronephrosis.  CT scan on 03/29/2018 shows bilateral renal pelvic fullness without overt hydroureteronephrosis.  Creatinine was stable at 0.91 on 03/26/20 and PSA 0.05 on 01/22/20.  PVR stably elevated at 295 today.  He is to void every 2 hours.  He remains on Myrbetriq 25 mg which has stabilized his symptoms.  He has no urinary  complaints.  No dysuria or gross hematuria.  No recent infections.  He is chronically colonized with bacteria.    PMH: Past Medical History:  Diagnosis Date  . Anemia    iron deficiency  . Aortic atherosclerosis ([place])   . Bilateral carotid artery disease (HCC)    Mild plaque formation without obstructive disease noted on carotid Doppler.  . Diabetes mellitus without complication ([place])   . Essential hypertension   . GERD (gastroesophageal reflux disease)   . Hyperlipidemia   . Hypertension   . Left carotid artery stenosis   . Nonobstructive Coronary artery disease    a. Previous cardiac catheterization at [facility] in 2010. The patient was told about 2 blockages which did not require revascularization; b. 01/2015 Cath: LM nl, LAD 10p/m, 79m/d, D2 30ost, LCX nl, RCA min irregs, EF 55-65%; b. 12/2018 MV: No ischemia/infarct. CT images w/ mod 3 vessel Cor Ca2+ and mild-mod Ao atherosclerosis.  . Prostate cancer ([place]) 03/2017  . Raynaud disease    a. Managed w/ nifedipine.    Surgical History: Past Surgical History:  Procedure Laterality Date  . CARDIAC CATHETERIZATION  2010  . CARDIAC CATHETERIZATION N/A 02/03/2015   Procedure: Left Heart Cath and Coronary Angiography;  Surgeon: Wellington [name], MD;  Location: [facility];  Service: Cardiovascular;  Laterality: N/A;  . CATARACT EXTRACTION Bilateral   . CYSTOSCOPY W/ RETROGRADES Bilateral 04/11/2017   Procedure: CYSTOSCOPY WITH RETROGRADE PYELOGRAM;  Surgeon: [name], MD;  Location: [facility];  Service: Urology;  Laterality: Bilateral;  . CYSTOSCOPY W/ RETROGRADES Bilateral 09/12/2017   Procedure: CYSTOSCOPY WITH RETROGRADE PYELOGRAM;  Surgeon: [name], MD;  Location: [facility];  Service: Urology;  Laterality: Bilateral;  . CYSTOSCOPY W/ RETROGRADES Bilateral 02/25/2018   Procedure:  CYSTOSCOPY WITH RETROGRADE PYELOGRAM;  Surgeon: [name], MD;  Location: [facility];  Service: Urology;  Laterality: Bilateral;  .  CYSTOSCOPY W/ URETERAL STENT PLACEMENT Bilateral 09/12/2017   Procedure: CYSTOSCOPY WITH STENT REPLACEMENT;  Surgeon: [name], MD;  Location: [facility];  Service: Urology;  Laterality: Bilateral;  . CYSTOSCOPY W/ URETERAL STENT REMOVAL Bilateral 02/25/2018   Procedure: CYSTOSCOPY WITH STENT REMOVAL;  Surgeon: [name], MD;  Location: [facility];  Service: Urology;  Laterality: Bilateral;  . CYSTOSCOPY WITH STENT PLACEMENT Left 04/11/2017   Procedure: CYSTOSCOPY WITH STENT PLACEMENT and fulgeration;  Surgeon: [name], MD;  Location: [facility];  Service: Urology;  Laterality: Left;  . CYSTOSCOPY WITH URETHRAL DILATATION Bilateral 04/11/2017   Procedure: CYSTOSCOPY WITH URETHRAL DILATATION;  Surgeon: [name], MD;  Location: [facility];  Service: Urology;  Laterality: Bilateral;  . IR CONVERT RIGHT NEPHROSTOMY TO NEPHROURETERAL CATH  05/21/2017  . IR NEPHROSTOMY PLACEMENT RIGHT  04/13/2017    Home Medications:  Allergies as of 05/12/2020   No Known Allergies     Medication List       Accurate as of May 12, 2020 10:09 AM. If you have any questions, ask your nurse or doctor.        acetaminophen 500 MG tablet Commonly known as: TYLENOL Take 500 mg by mouth every 4 (four) hours as needed for moderate pain or fever.   albuterol 108 (90 Base) MCG/ACT inhaler Commonly known as: VENTOLIN HFA Inhale 2 puffs into the lungs every 4 (four) hours as needed for wheezing or shortness of breath.   atorvastatin 10 MG tablet Commonly known as: LIPITOR Take 10 mg by mouth daily at 6 PM.   Calcium 600+D3 600-800 MG-UNIT Tabs Generic drug: Calcium Carb-Cholecalciferol Take 1 tablet by mouth every evening.   Calcium Carbonate-Vitamin D 600-200 MG-UNIT Tabs Take by mouth.   glimepiride 2 MG tablet Commonly known as: AMARYL Take 2 mg by mouth daily with breakfast.   glucose blood test strip Commonly known as: Accu-Chek Guide Use asdirected three times a day diag E11.65    ibuprofen 200 MG tablet Commonly known as: ADVIL Take 200 mg by mouth every 6 (six) hours as needed for fever or moderate pain.   iron polysaccharides 150 MG capsule Commonly known as: NIFEREX Take 150 mg by mouth daily.   lisinopril 2.5 MG tablet Commonly known as: ZESTRIL Take 2.5 mg by mouth daily.   metFORMIN 1000 MG tablet Commonly known as: GLUCOPHAGE Take 1,000 mg by mouth 2 (two) times daily.   metoprolol succinate 25 MG 24 hr tablet Commonly known as: TOPROL-XL TAKE 1 TABLET BY MOUTH EVERY DAY FOR BLOOD PRESSURE   multivitamin with minerals tablet Take 1 tablet by mouth daily.   Myrbetriq 25 MG Tb24 tablet Generic drug: mirabegron ER TAKE 1 TABLET BY MOUTH EVERY DAY   NIFEdipine 30 MG 24 hr tablet Commonly known as: ADALAT CC TAKE 1 TABLET BY MOUTH EVERY DAY   omeprazole 20 MG capsule Commonly known as: PRILOSEC TAKE ONE CAPSULE BY MOUTH EVERY DAY   Xtandi 40 MG capsule Generic drug: enzalutamide TAKE 2 CAPSULES (80 MG TOTAL) BY MOUTH DAILY.       Allergies: No Known Allergies  Family History: Family History  Problem Relation Age of Onset  . Hypertension Father     Social History:  reports that he has never smoked. He has never used smokeless tobacco. He reports that he does not drink alcohol and does not use drugs.  Physical Exam: BP (!) 142/88   Pulse 80   Ht 5\' 6"  (1.676 m)   Wt 130 lb (59 kg)   BMI 20.98 kg/m   Constitutional:  Alert and oriented, No acute distress. HEENT: [place] AT, moist mucus membranes.  Trachea midline, no masses. Cardiovascular: No clubbing, cyanosis, or edema. Respiratory: Normal respiratory effort, no increased work of breathing. Skin: No rashes, bruises or suspicious lesions. Neurologic: Grossly intact, no focal deficits, moving all 4 extremities. Psychiatric: Normal mood and affect.  Results for orders placed or performed in visit on 05/12/20  BLADDER SCAN AMB NON-IMAGING  Result Value Ref Range   Scan Result  295 ml    Assessment/plan:  1. Incomplete bladder emptying PVR remains stably elevated, otherwise asymptomatic  Not interested in outlet procedure or dilation of bladder neck at this time, may consider for the future if he develops frank retention  Continue Myrbetriq 25 mg for urinary frequency but do not want to increase his dose due to concern for retention as previously discussed - BLADDER SCAN AMB NON-IMAGING - mirabegron ER (MYRBETRIQ) 25 MG TB24 tablet; Take 1 tablet (25 mg total) by mouth daily.  Dispense: 90 tablet; Refill: 3  2. Prostate cancer [facility]) Well-controlled by Dr. Rogue Bussing, PSA is stable - mirabegron ER (MYRBETRIQ) 25 MG TB24 tablet; Take 1 tablet (25 mg total) by mouth daily.  Dispense: 90 tablet; Refill: 3  3. Bilateral hydronephrosis Resolved with treatment of prostate cancer, creatinine remained stable normal  4. Chronic bacteriuria No symptomatic infections, will continue to monitor   Given his overall stability now, will increase interval follow-up to 1 year, advised he should return if he has any worsening of his urinary symptoms, increased frequency, inability to empty, infections, etc.   [name], MD

## 2020-05-15 NOTE — Progress Notes (Signed)
Subjective:    Patient ID: Michael [name], male    DOB: 09/11/1933, 87 y.o.   MRN: [id number] Chief Complaint  Patient presents with  . Follow-up    1 yr U/S     Patient returns today in follow up of his Raynaud's disease.  His symptoms are doing much better.  His pain is gone and his wounds have healed.  There are no significant complaints today.  No issues with significant discoloration or new ulceration formation.  Today noninvasive studies are actually improved from last year.  With a right ABI 1.22 on the left 1.24.  TBI's are also normal.  Patient has triphasic tibial artery waveforms with good toe waveforms bilaterally.  Overall patient is doing well.  Patient is also tolerating the nifedipine well      Review of Systems  Skin: Negative for wound.  All other systems reviewed and are negative.      Objective:   Physical Exam Vitals reviewed.  HENT:     Head: Normocephalic.  Cardiovascular:     Rate and Rhythm: Normal rate.     Pulses: Normal pulses.  Musculoskeletal:        General: Normal range of motion.  Skin:    General: Skin is warm and dry.  Neurological:     Mental Status: He is oriented to person, place, and time.  Psychiatric:        Mood and Affect: Mood normal.        Behavior: Behavior normal.        Thought Content: Thought content normal.        Judgment: Judgment normal.     BP 133/79   Pulse 85   Ht 5\' 6"  (1.676 m)   Wt 129 lb (58.5 kg)   BMI 20.82 kg/m   Past Medical History:  Diagnosis Date  . Anemia    iron deficiency  . Aortic atherosclerosis ([place])   . Bilateral carotid artery disease (HCC)    Mild plaque formation without obstructive disease noted on carotid Doppler.  . Diabetes mellitus without complication ([place])   . Essential hypertension   . GERD (gastroesophageal reflux disease)   . Hyperlipidemia   . Hypertension   . Left carotid artery stenosis   . Nonobstructive Coronary artery disease    a. Previous cardiac  catheterization at [facility] in 2010. The patient was told about 2 blockages which did not require revascularization; b. 01/2015 Cath: LM nl, LAD 10p/m, 70m/d, D2 30ost, LCX nl, RCA min irregs, EF 55-65%; b. 12/2018 MV: No ischemia/infarct. CT images w/ mod 3 vessel Cor Ca2+ and mild-mod Ao atherosclerosis.  . Prostate cancer ([place]) 03/2017  . Raynaud disease    a. Managed w/ nifedipine.    Social History   Socioeconomic History  . Marital status: Widowed    Spouse name: Not on file  . Number of children: Not on file  . Years of education: Not on file  . Highest education level: Not on file  Occupational History    Comment: retired  Tobacco Use  . Smoking status: Never Smoker  . Smokeless tobacco: Never Used  Vaping Use  . Vaping Use: Never used  Substance and Sexual Activity  . Alcohol use: No  . Drug use: No  . Sexual activity: Not Currently  Other Topics Concern  . Not on file  Social History Narrative  . Not on file   Social Determinants of Health   Financial Resource Strain: Not on file  Food Insecurity: Not on file  Transportation Needs: Not on file  Physical Activity: Not on file  Stress: Not on file  Social Connections: Not on file  Intimate Partner Violence: Not on file    Past Surgical History:  Procedure Laterality Date  . CARDIAC CATHETERIZATION  2010  . CARDIAC CATHETERIZATION N/A 02/03/2015   Procedure: Left Heart Cath and Coronary Angiography;  Surgeon: Wellington [name], MD;  Location: [facility];  Service: Cardiovascular;  Laterality: N/A;  . CATARACT EXTRACTION Bilateral   . CYSTOSCOPY W/ RETROGRADES Bilateral 04/11/2017   Procedure: CYSTOSCOPY WITH RETROGRADE PYELOGRAM;  Surgeon: [name], MD;  Location: [facility];  Service: Urology;  Laterality: Bilateral;  . CYSTOSCOPY W/ RETROGRADES Bilateral 09/12/2017   Procedure: CYSTOSCOPY WITH RETROGRADE PYELOGRAM;  Surgeon: [name], MD;  Location: [facility];  Service: Urology;  Laterality:  Bilateral;  . CYSTOSCOPY W/ RETROGRADES Bilateral 02/25/2018   Procedure: CYSTOSCOPY WITH RETROGRADE PYELOGRAM;  Surgeon: [name], MD;  Location: [facility];  Service: Urology;  Laterality: Bilateral;  . CYSTOSCOPY W/ URETERAL STENT PLACEMENT Bilateral 09/12/2017   Procedure: CYSTOSCOPY WITH STENT REPLACEMENT;  Surgeon: [name], MD;  Location: [facility];  Service: Urology;  Laterality: Bilateral;  . CYSTOSCOPY W/ URETERAL STENT REMOVAL Bilateral 02/25/2018   Procedure: CYSTOSCOPY WITH STENT REMOVAL;  Surgeon: [name], MD;  Location: [facility];  Service: Urology;  Laterality: Bilateral;  . CYSTOSCOPY WITH STENT PLACEMENT Left 04/11/2017   Procedure: CYSTOSCOPY WITH STENT PLACEMENT and fulgeration;  Surgeon: [name], MD;  Location: [facility];  Service: Urology;  Laterality: Left;  . CYSTOSCOPY WITH URETHRAL DILATATION Bilateral 04/11/2017   Procedure: CYSTOSCOPY WITH URETHRAL DILATATION;  Surgeon: [name], MD;  Location: [facility];  Service: Urology;  Laterality: Bilateral;  . IR CONVERT RIGHT NEPHROSTOMY TO NEPHROURETERAL CATH  05/21/2017  . IR NEPHROSTOMY PLACEMENT RIGHT  04/13/2017    Family History  Problem Relation Age of Onset  . Hypertension Father     No Known Allergies  CBC Latest Ref Rng & Units 03/26/2020 01/23/2020 11/17/2019  WBC 4.0 - 10.5 K/uL 4.8 5.8 6.1  Hemoglobin 13.0 - 17.0 g/dL 11.9(L) 12.5(L) 12.2(L)  Hematocrit 39.0 - 52.0 % 34.9(L) 37.4(L) 36.5(L)  Platelets 150 - 400 K/uL 253 229 223      CMP     Component Value Date/Time   NA 135 03/26/2020 0821   NA 136 05/07/2012 1328   K 4.3 03/26/2020 0821   K 4.5 05/07/2012 1328   CL 101 03/26/2020 0821   CL 104 05/07/2012 1328   CO2 25 03/26/2020 0821   CO2 28 05/07/2012 1328   GLUCOSE 199 (H) 03/26/2020 0821   GLUCOSE 73 05/07/2012 1328   BUN 18 03/26/2020 0821   BUN 16 05/07/2012 1328   CREATININE 0.91 03/26/2020 0821   CREATININE 0.84 05/07/2012 1328   CALCIUM 9.2 03/26/2020 0821    CALCIUM 8.8 05/07/2012 1328   PROT 7.6 03/26/2020 0821   ALBUMIN 3.7 03/26/2020 0821   AST 19 03/26/2020 0821   ALT 12 03/26/2020 0821   ALKPHOS 54 03/26/2020 0821   BILITOT 0.5 03/26/2020 0821   GFRNONAA >60 03/26/2020 0821   GFRNONAA >60 05/07/2012 1328   GFRAA >60 11/17/2019 0819   GFRAA >60 05/07/2012 1328     No results found.     Assessment & Plan:   1. Raynaud's disease without gangrene The patient's Raynaud's seems to be well controlled currently.  Nifedipine is helping his symptoms and the weather is  also helpful.  No new ulcerations currently.  We will follow up to evaluate in 1 year.  2. Essential hypertension Continue antihypertensive medications as already ordered, these medications have been reviewed and there are no changes at this time.   3. Hyperlipidemia, unspecified hyperlipidemia type Continue statin as ordered and reviewed, no changes at this time    Current Outpatient Medications on File Prior to Visit  Medication Sig Dispense Refill  . acetaminophen (TYLENOL) 500 MG tablet Take 500 mg by mouth every 4 (four) hours as needed for moderate pain or fever.    [name] albuterol (PROVENTIL HFA;VENTOLIN HFA) 108 (90 Base) MCG/ACT inhaler Inhale 2 puffs into the lungs every 4 (four) hours as needed for wheezing or shortness of breath.    [name] atorvastatin (LIPITOR) 10 MG tablet Take 10 mg by mouth daily at 6 PM.     . Calcium Carb-Cholecalciferol (CALCIUM 600+D3) 600-800 MG-UNIT TABS Take 1 tablet by mouth every evening.     . Calcium Carbonate-Vitamin D 600-200 MG-UNIT TABS Take by mouth.    [name] glimepiride (AMARYL) 2 MG tablet Take 2 mg by mouth daily with breakfast.    . glucose blood (ACCU-CHEK GUIDE) test strip Use asdirected three times a day diag E11.65 100 each 3  . ibuprofen (ADVIL,MOTRIN) 200 MG tablet Take 200 mg by mouth every 6 (six) hours as needed for fever or moderate pain.     . iron polysaccharides (NIFEREX) 150 MG capsule Take 150 mg by mouth daily.      [name] lisinopril (ZESTRIL) 2.5 MG tablet Take 2.5 mg by mouth daily.    . metFORMIN (GLUCOPHAGE) 1000 MG tablet Take 1,000 mg by mouth 2 (two) times daily.    . metoprolol succinate (TOPROL-XL) 25 MG 24 hr tablet TAKE 1 TABLET BY MOUTH EVERY DAY FOR BLOOD PRESSURE 90 tablet 3  . Multiple Vitamins-Minerals (MULTIVITAMIN WITH MINERALS) tablet Take 1 tablet by mouth daily.     [name] NIFEdipine (ADALAT CC) 30 MG 24 hr tablet TAKE 1 TABLET BY MOUTH EVERY DAY 90 tablet 2  . omeprazole (PRILOSEC) 20 MG capsule TAKE ONE CAPSULE BY MOUTH EVERY DAY 90 capsule 2  . XTANDI 40 MG capsule TAKE 2 CAPSULES (80 MG TOTAL) BY MOUTH DAILY. 60 capsule 3   No current facility-administered medications on file prior to visit.    There are no Patient Instructions on file for this visit. No follow-ups on file.   Kris Hartmann, NP

## 2020-05-16 ENCOUNTER — Encounter (INDEPENDENT_AMBULATORY_CARE_PROVIDER_SITE_OTHER): Payer: Self-pay | Admitting: Nurse Practitioner

## 2020-05-20 ENCOUNTER — Other Ambulatory Visit (HOSPITAL_COMMUNITY): Payer: Self-pay

## 2020-05-26 ENCOUNTER — Other Ambulatory Visit (HOSPITAL_COMMUNITY): Payer: Self-pay

## 2020-05-26 MED FILL — Enzalutamide Cap 40 MG: ORAL | 30 days supply | Qty: 60 | Fill #0 | Status: AC

## 2020-05-28 ENCOUNTER — Inpatient Hospital Stay: Payer: Medicare HMO | Attending: Oncology

## 2020-05-28 ENCOUNTER — Other Ambulatory Visit (HOSPITAL_COMMUNITY): Payer: Self-pay

## 2020-05-28 ENCOUNTER — Encounter: Payer: Self-pay | Admitting: Internal Medicine

## 2020-05-28 ENCOUNTER — Inpatient Hospital Stay (HOSPITAL_BASED_OUTPATIENT_CLINIC_OR_DEPARTMENT_OTHER): Payer: Medicare HMO | Admitting: Internal Medicine

## 2020-05-28 DIAGNOSIS — E1165 Type 2 diabetes mellitus with hyperglycemia: Secondary | ICD-10-CM | POA: Diagnosis not present

## 2020-05-28 DIAGNOSIS — I7300 Raynaud's syndrome without gangrene: Secondary | ICD-10-CM | POA: Diagnosis not present

## 2020-05-28 DIAGNOSIS — Z79899 Other long term (current) drug therapy: Secondary | ICD-10-CM | POA: Insufficient documentation

## 2020-05-28 DIAGNOSIS — C61 Malignant neoplasm of prostate: Secondary | ICD-10-CM

## 2020-05-28 DIAGNOSIS — I2510 Atherosclerotic heart disease of native coronary artery without angina pectoris: Secondary | ICD-10-CM | POA: Insufficient documentation

## 2020-05-28 DIAGNOSIS — E785 Hyperlipidemia, unspecified: Secondary | ICD-10-CM | POA: Diagnosis not present

## 2020-05-28 DIAGNOSIS — K219 Gastro-esophageal reflux disease without esophagitis: Secondary | ICD-10-CM | POA: Diagnosis not present

## 2020-05-28 DIAGNOSIS — I10 Essential (primary) hypertension: Secondary | ICD-10-CM | POA: Insufficient documentation

## 2020-05-28 DIAGNOSIS — C7951 Secondary malignant neoplasm of bone: Secondary | ICD-10-CM | POA: Insufficient documentation

## 2020-05-28 DIAGNOSIS — Z191 Hormone sensitive malignancy status: Secondary | ICD-10-CM | POA: Diagnosis not present

## 2020-05-28 DIAGNOSIS — D509 Iron deficiency anemia, unspecified: Secondary | ICD-10-CM | POA: Diagnosis not present

## 2020-05-28 LAB — CBC WITH DIFFERENTIAL/PLATELET
Abs Immature Granulocytes: 0.02 10*3/uL (ref 0.00–0.07)
Basophils Absolute: 0 10*3/uL (ref 0.0–0.1)
Basophils Relative: 1 %
Eosinophils Absolute: 0 10*3/uL (ref 0.0–0.5)
Eosinophils Relative: 1 %
HCT: 35.6 % — ABNORMAL LOW (ref 39.0–52.0)
Hemoglobin: 12.2 g/dL — ABNORMAL LOW (ref 13.0–17.0)
Immature Granulocytes: 0 %
Lymphocytes Relative: 30 %
Lymphs Abs: 1.8 10*3/uL (ref 0.7–4.0)
MCH: 32.3 pg (ref 26.0–34.0)
MCHC: 34.3 g/dL (ref 30.0–36.0)
MCV: 94.2 fL (ref 80.0–100.0)
Monocytes Absolute: 0.6 10*3/uL (ref 0.1–1.0)
Monocytes Relative: 10 %
Neutro Abs: 3.5 10*3/uL (ref 1.7–7.7)
Neutrophils Relative %: 58 %
Platelets: 235 10*3/uL (ref 150–400)
RBC: 3.78 MIL/uL — ABNORMAL LOW (ref 4.22–5.81)
RDW: 12.5 % (ref 11.5–15.5)
WBC: 6 10*3/uL (ref 4.0–10.5)
nRBC: 0 % (ref 0.0–0.2)

## 2020-05-28 LAB — COMPREHENSIVE METABOLIC PANEL
ALT: 13 U/L (ref 0–44)
AST: 21 U/L (ref 15–41)
Albumin: 4 g/dL (ref 3.5–5.0)
Alkaline Phosphatase: 50 U/L (ref 38–126)
Anion gap: 9 (ref 5–15)
BUN: 19 mg/dL (ref 8–23)
CO2: 25 mmol/L (ref 22–32)
Calcium: 9.4 mg/dL (ref 8.9–10.3)
Chloride: 102 mmol/L (ref 98–111)
Creatinine, Ser: 1.04 mg/dL (ref 0.61–1.24)
GFR, Estimated: >60 mL/min (ref >60)
Glucose, Bld: 263 mg/dL — ABNORMAL HIGH (ref 70–99)
Potassium: 4.9 mmol/L (ref 3.5–5.1)
Sodium: 136 mmol/L (ref 135–145)
Total Bilirubin: 0.5 mg/dL (ref 0.3–1.2)
Total Protein: 7.8 g/dL (ref 6.5–8.1)

## 2020-05-28 LAB — PSA: Prostatic Specific Antigen: 0.08 ng/mL (ref 0.00–4.00)

## 2020-05-28 NOTE — Assessment & Plan Note (Addendum)
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 Eligard [17m q 27m; last 03/10/2019; On Xtandi; February 2020-CT scan shows continued significant improvement of the retroperitoneal adenopathy; bladder/rectal invasion.;  Diffuse diffuse osseous metastatic disease- STABLE.     #Continue Xtandi 80 mg a day [secondary to fatigue]; FEB 2022-STABLE;  0.06; reviewed the slightly rising PSA; however since patient is clinically doing well continue current therapy.  # Anemia-iron deficiency-hemoglobin ~12- STABLE;  on PO iron.   # DM-type II/insulin-STABLE; BG-248- post BF.   #Discussed with pharmacy regarding getting extra medication/54-month given his upcoming travel plans.  *[place]- may-june # DISPOSITION:  # Follow up in 3 month-MD/labs-cbc/cmp/psa;  [[place]/friday early AM appts]Dr.B

## 2020-05-28 NOTE — Progress Notes (Signed)
[place] OFFICE PROGRESS NOTE  Patient Care Team: [name], MD as PCP - General (Internal Medicine) Wellington [name], MD as PCP - Cardiology (Cardiology)  Cancer Staging No matching staging information was found for the patient.   Oncology History Overview Note  # FEB 2019-Metastatic prostate cancer/ castrate sensitive [bulky retroperitoneal adenopathy; invasion into the bladder; rectum] Bladder Bx- prostate. On Degarelix  # April 3rd 2019- [name] [171md];  Lupron q 6 M [[id number]]; MID AUG 2019- reduced dose to 89mday  # Poorly controlled DM-on insulin  # s/p right PCN [explanted]/ Foley cath [Dr.Brandon]; multiple UTIs. [s/p ID; Dr.Ravishankar]  ----------------------------------------------------------------- DIAGNOSIS: [ FEB 2019] MET. PROSTATE CA  STAGE:   IV ;GOALS: PALLIATIVE  CURRENT/MOST RECENT THERAPY [ Feb-April2019] Lupron+ [name]    [facility]     INTERVAL HISTORY:  Michael WAMBLE89.o.  male pleasant patient [name] is here for follow-up.  Patient missed about 20  days of Xtandi when he ran out of his medications.  His family had COVID/unable to contact pharmacy.  Is currently back on Xtandi  [place]  No worsening back pain.  No use recent UTIs.  No new shortness of breath or cough.  Review of Systems  Constitutional: Positive for malaise/fatigue. Negative for chills, diaphoresis, fever and weight loss.  HENT: Negative for nosebleeds and sore throat.   Eyes: Negative for double vision.  Respiratory: Negative for cough, hemoptysis, sputum production and wheezing.   Cardiovascular: Negative for chest pain, palpitations, orthopnea and leg swelling.  Gastrointestinal: Negative for abdominal pain, blood in stool, constipation, diarrhea, heartburn, melena, nausea and vomiting.   Musculoskeletal: Positive for back pain and joint pain.  Skin: Negative.  Negative for itching and rash.  Neurological: Negative for dizziness, tingling, focal weakness, weakness and headaches.  Endo/Heme/Allergies: Does not bruise/bleed easily.  Psychiatric/Behavioral: Negative for depression. The patient is not nervous/anxious and does not have insomnia.       PAST MEDICAL HISTORY :  Past Medical History:  Diagnosis Date  . Anemia    iron deficiency  . [place]  . Bilateral carotid artery disease (HCC)    Mild plaque formation without obstructive disease noted on carotid Doppler.  . Diabetes mellitus without complication ([facility]  . Essential hypertension   . GERD (gastroesophageal reflux disease)   . Hyperlipidemia   . Hypertension   . Left carotid artery stenosis   . Nonobstructive Coronary artery disease    a. [facility] The patient was told about 2 blockages which did not require revascularization; b. 01/2015 Cath: LM nl, LAD 10p/m, 4563m D2 30ost, LCX nl, RCA min irregs, EF 55-65%; b. 12/2018 MV: No ischemia/infarct. CT images w/ mod 3 vessel Cor Ca2+ and mild-mod Ao atherosclerosis.  . Prostate cancer ([facility]  . Raynaud disease    a. Managed w/ nifedipine.    PAST SURGICAL HISTORY :   Past Surgical History:  Procedure Laterality Date  . CARDIAC CATHETERIZATION  2010  . CARDIAC CATHETERIZATION N/A 02/03/2015   Procedure: Left Heart Cath and Coronary Angiography;  Surgeon: MuhWellington HampshireD;  Location: [facility];  Service: Cardiovascular;  Laterality: N/A;  . CATARACT EXTRACTION Bilateral   . CYSTOSCOPY W/ RETROGRADES Bilateral 04/11/2017   Procedure: CYSTOSCOPY WITH RETROGRADE PYELOGRAM;  Surgeon: BraHollice EspyD;  Location:  [facility];  Service: Urology;  Laterality: Bilateral;  . CYSTOSCOPY W/ RETROGRADES Bilateral 09/12/2017   Procedure: CYSTOSCOPY WITH RETROGRADE PYELOGRAM;  Surgeon:  [name], MD;  Location: [facility];  Service: Urology;  Laterality: Bilateral;  . CYSTOSCOPY W/ RETROGRADES Bilateral 02/25/2018   Procedure: CYSTOSCOPY WITH RETROGRADE PYELOGRAM;  Surgeon: [name], MD;  Location: [facility];  Service: Urology;  Laterality: Bilateral;  . CYSTOSCOPY W/ URETERAL STENT PLACEMENT Bilateral 09/12/2017   Procedure: CYSTOSCOPY WITH STENT REPLACEMENT;  Surgeon: [name], MD;  Location: [facility];  Service: Urology;  Laterality: Bilateral;  . CYSTOSCOPY W/ URETERAL STENT REMOVAL Bilateral 02/25/2018   Procedure: CYSTOSCOPY WITH STENT REMOVAL;  Surgeon: [name], MD;  Location: [facility];  Service: Urology;  Laterality: Bilateral;  . CYSTOSCOPY WITH STENT PLACEMENT Left 04/11/2017   Procedure: CYSTOSCOPY WITH STENT PLACEMENT and fulgeration;  Surgeon: [name], MD;  Location: [facility];  Service: Urology;  Laterality: Left;  . CYSTOSCOPY WITH URETHRAL DILATATION Bilateral 04/11/2017   Procedure: CYSTOSCOPY WITH URETHRAL DILATATION;  Surgeon: [name], MD;  Location: [facility];  Service: Urology;  Laterality: Bilateral;  . IR CONVERT RIGHT NEPHROSTOMY TO NEPHROURETERAL CATH  05/21/2017  . IR NEPHROSTOMY PLACEMENT RIGHT  04/13/2017    FAMILY HISTORY :   Family History  Problem Relation Age of Onset  . Hypertension Father     SOCIAL HISTORY:   Social History   Tobacco Use  . Smoking status: Never Smoker  . Smokeless tobacco: Never Used  Vaping Use  . Vaping Use: Never used  Substance Use Topics  . Alcohol use: No  . Drug use: No    ALLERGIES:  has No Known Allergies.  MEDICATIONS:  Current Outpatient Medications  Medication Sig Dispense Refill  . acetaminophen (TYLENOL) 500 MG tablet Take 500 mg by mouth every 4 (four) hours as needed for moderate pain or fever.    [name] albuterol (PROVENTIL HFA;VENTOLIN HFA) 108 (90 Base) MCG/ACT inhaler Inhale 2 puffs into the lungs every 4 (four) hours as needed for wheezing or shortness of breath.     [name] atorvastatin (LIPITOR) 10 MG tablet Take 10 mg by mouth daily at 6 PM.     . Calcium Carb-Cholecalciferol (CALCIUM 600+D3) 600-800 MG-UNIT TABS Take 1 tablet by mouth every evening.     . Calcium Carbonate-Vitamin D 600-200 MG-UNIT TABS Take by mouth.    . enzalutamide (XTANDI) 40 MG capsule TAKE 2 CAPSULES (80 MG TOTAL) BY MOUTH DAILY. 60 capsule 3  . glimepiride (AMARYL) 2 MG tablet Take 2 mg by mouth daily with breakfast.    . glucose blood (ACCU-CHEK GUIDE) test strip Use asdirected three times a day diag E11.65 100 each 3  . ibuprofen (ADVIL,MOTRIN) 200 MG tablet Take 200 mg by mouth every 6 (six) hours as needed for fever or moderate pain.     . iron polysaccharides (NIFEREX) 150 MG capsule Take 150 mg by mouth daily.     [name] lisinopril (ZESTRIL) 2.5 MG tablet Take 2.5 mg by mouth daily.    . metFORMIN (GLUCOPHAGE) 1000 MG tablet Take 1,000 mg by mouth 2 (two) times daily.    . metoprolol succinate (TOPROL-XL) 25 MG 24 hr tablet TAKE 1 TABLET BY MOUTH EVERY DAY FOR BLOOD PRESSURE 90 tablet 3  . mirabegron ER (MYRBETRIQ) 25 MG TB24 tablet Take 1 tablet (25 mg total) by mouth daily. 90 tablet 3  . Multiple Vitamins-Minerals (MULTIVITAMIN WITH MINERALS) tablet Take 1 tablet by mouth daily.     [name]  NIFEdipine (ADALAT CC) 30 MG 24 hr tablet TAKE 1 TABLET BY MOUTH EVERY DAY 90 tablet 2  . omeprazole (PRILOSEC) 20 MG capsule TAKE ONE CAPSULE BY MOUTH EVERY DAY 90 capsule 2   No current facility-administered medications for this visit.    PHYSICAL EXAMINATION: ECOG PERFORMANCE STATUS: 1 - Symptomatic but completely ambulatory  BP (!) 149/86 (BP Location: Left Arm, Patient Position: Sitting, Cuff Size: Normal)   Pulse 76   Temp (!) 95.8 F (35.4 C) (Tympanic)   Resp 16   Ht '5\' 6"'  (1.676 m)   Wt 128 lb (58.1 kg)   SpO2 100%   BMI 20.66 kg/m   Filed Weights   05/28/20 0829  Weight: 128 lb (58.1 kg)    Physical Exam Constitutional:      Comments: Accompanied by his daughter.   Walking himself.  HENT:     Head: Normocephalic and atraumatic.     Mouth/Throat:     Pharynx: No oropharyngeal exudate.  Eyes:     Pupils: Pupils are equal, round, and reactive to light.  Cardiovascular:     Rate and Rhythm: Normal rate and regular rhythm.  Pulmonary:     Effort: Pulmonary effort is normal. No respiratory distress.     Breath sounds: Normal breath sounds. No wheezing.  Abdominal:     General: Bowel sounds are normal. There is no distension.     Palpations: Abdomen is soft. There is no mass.     Tenderness: There is no abdominal tenderness. There is no guarding or rebound.  Musculoskeletal:        General: No tenderness. Normal range of motion.     Cervical back: Normal range of motion and neck supple.  Skin:    General: Skin is warm.  Neurological:     Mental Status: He is alert and oriented to person, place, and time.  Psychiatric:        Mood and Affect: Affect normal.      LABORATORY DATA:  I have reviewed the data as listed    Component Value Date/Time   NA 136 05/28/2020 0822   NA 136 05/07/2012 1328   K 4.9 05/28/2020 0822   K 4.5 05/07/2012 1328   CL 102 05/28/2020 0822   CL 104 05/07/2012 1328   CO2 25 05/28/2020 0822   CO2 28 05/07/2012 1328   GLUCOSE 263 (H) 05/28/2020 0822   GLUCOSE 73 05/07/2012 1328   BUN 19 05/28/2020 0822   BUN 16 05/07/2012 1328   CREATININE 1.04 05/28/2020 0822   CREATININE 0.84 05/07/2012 1328   CALCIUM 9.4 05/28/2020 0822   CALCIUM 8.8 05/07/2012 1328   PROT 7.8 05/28/2020 0822   ALBUMIN 4.0 05/28/2020 0822   AST 21 05/28/2020 0822   ALT 13 05/28/2020 0822   ALKPHOS 50 05/28/2020 0822   BILITOT 0.5 05/28/2020 0822   GFRNONAA >60 05/28/2020 0822   GFRNONAA >60 05/07/2012 1328   GFRAA >60 11/17/2019 0819   GFRAA >60 05/07/2012 1328    No results found for: SPEP, UPEP  Lab Results  Component Value Date   WBC 6.0 05/28/2020   NEUTROABS 3.5 05/28/2020   HGB 12.2 (L) 05/28/2020   HCT 35.6 (L)  05/28/2020   MCV 94.2 05/28/2020   PLT 235 05/28/2020      Chemistry      Component Value Date/Time   NA 136 05/28/2020 0822   NA 136 05/07/2012 1328   K 4.9 05/28/2020 0822   K 4.5 05/07/2012  1328   CL 102 05/28/2020 0822   CL 104 05/07/2012 1328   CO2 25 05/28/2020 0822   CO2 28 05/07/2012 1328   BUN 19 05/28/2020 0822   BUN 16 05/07/2012 1328   CREATININE 1.04 05/28/2020 0822   CREATININE 0.84 05/07/2012 1328      Component Value Date/Time   CALCIUM 9.4 05/28/2020 0822   CALCIUM 8.8 05/07/2012 1328   ALKPHOS 50 05/28/2020 0822   AST 21 05/28/2020 0822   ALT 13 05/28/2020 0822   BILITOT 0.5 05/28/2020 9798       RADIOGRAPHIC STUDIES: I have personally reviewed the radiological images as listed and agreed with the findings in the report. No results found.   ASSESSMENT & PLAN:  Prostate cancer [facility]) # Metastatic prostate cancer/ castrate sensitive [bulky retroperitoneal adenopathy; invasion into the bladder; rectum]. Eligard [33mq 669mlast 03/10/2019; On Xtandi; February 2020-CT scan shows continued significant improvement of the retroperitoneal adenopathy; bladder/rectal invasion.;  Diffuse diffuse osseous metastatic disease- STABLE.     #Continue Xtandi 80 mg a day [secondary to fatigue]; FEB 2022-STABLE;  0.06; reviewed the slightly rising PSA; however since patient is clinically doing well continue current therapy.  # Anemia-iron deficiency-hemoglobin ~12- STABLE;  on PO iron.   # DM-type II/insulin-STABLE; BG-248- post BF.   #Discussed with pharmacy regarding getting extra medication/2-65-monthven his upcoming travel plans.  *[place] DIS[place]:  # Follow up in 3 month-MD/labs-cbc/cmp/psa;  [[place]/friday early AM appts]Dr.B    Orders Placed This Encounter  Procedures  . Comprehensive metabolic panel    Standing Status:   Future    Standing Expiration Date:   05/28/2021  . CBC with Differential/Platelet    Standing Status:   Future     Standing Expiration Date:   05/28/2021  . PSA    Standing Status:   Future    Standing Expiration Date:   05/28/2021   All questions were answered. The patient knows to call the clinic with any problems, questions or concerns.      GovCammie SickleD 05/28/2020 10:07 AM

## 2020-05-28 NOTE — Progress Notes (Signed)
Wants to see about having a 90 day supply of xtandi due to traveling to [place] in May.

## 2020-05-31 ENCOUNTER — Other Ambulatory Visit: Payer: Self-pay | Admitting: Pharmacist

## 2020-05-31 ENCOUNTER — Other Ambulatory Visit (HOSPITAL_COMMUNITY): Payer: Self-pay

## 2020-05-31 DIAGNOSIS — C61 Malignant neoplasm of prostate: Secondary | ICD-10-CM

## 2020-05-31 MED ORDER — ENZALUTAMIDE 40 MG PO TABS
160.0000 mg | ORAL_TABLET | Freq: Every day | ORAL | 0 refills | Status: DC
  Filled 2020-05-31 – 2020-06-11 (×2): qty 120, 30d supply, fill #0

## 2020-06-11 ENCOUNTER — Other Ambulatory Visit (HOSPITAL_COMMUNITY): Payer: Self-pay

## 2020-06-14 ENCOUNTER — Other Ambulatory Visit (HOSPITAL_COMMUNITY): Payer: Self-pay

## 2020-06-15 ENCOUNTER — Other Ambulatory Visit: Payer: Self-pay

## 2020-06-15 ENCOUNTER — Ambulatory Visit: Payer: Medicare HMO | Attending: Internal Medicine

## 2020-06-15 DIAGNOSIS — Z23 Encounter for immunization: Secondary | ICD-10-CM

## 2020-06-15 MED ORDER — PFIZER-BIONT COVID-19 VAC-TRIS 30 MCG/0.3ML IM SUSP
INTRAMUSCULAR | 0 refills | Status: DC
  Filled 2020-06-15: qty 0.3, 1d supply, fill #0

## 2020-06-15 NOTE — Progress Notes (Signed)
   Covid-19 Vaccination Clinic  Name:  Michael [name]    MRN: [id number] DOB: 05-31-1933  06/15/2020  [name] was observed post Covid-19 immunization for 15 minutes without incident. He was provided with Vaccine Information Sheet and instruction to access the V-Safe system.   [name] was instructed to call 911 with any severe reactions post vaccine: [name] Difficulty breathing  . Swelling of face and throat  . A fast heartbeat  . A bad rash all over body  . Dizziness and weakness   Immunizations Administered    Name Date Dose VIS Date Route   PFIZER Comrnaty(Gray TOP) Covid-19 Vaccine 06/15/2020  2:18 PM 0.3 mL 01/29/2020 Intramuscular   Manufacturer: [employer]   Lot: [id number]   NDC: [phone]

## 2020-06-25 ENCOUNTER — Encounter (INDEPENDENT_AMBULATORY_CARE_PROVIDER_SITE_OTHER): Payer: Medicare Other

## 2020-06-25 ENCOUNTER — Ambulatory Visit (INDEPENDENT_AMBULATORY_CARE_PROVIDER_SITE_OTHER): Payer: Medicare Other | Admitting: Vascular Surgery

## 2020-08-02 ENCOUNTER — Other Ambulatory Visit (HOSPITAL_COMMUNITY): Payer: Self-pay

## 2020-08-05 ENCOUNTER — Other Ambulatory Visit: Payer: Self-pay | Admitting: Internal Medicine

## 2020-08-05 ENCOUNTER — Other Ambulatory Visit (HOSPITAL_COMMUNITY): Payer: Self-pay

## 2020-08-05 DIAGNOSIS — C61 Malignant neoplasm of prostate: Secondary | ICD-10-CM

## 2020-08-05 MED ORDER — ENZALUTAMIDE 40 MG PO TABS
160.0000 mg | ORAL_TABLET | Freq: Every day | ORAL | 3 refills | Status: DC
  Filled 2020-08-05: qty 120, 30d supply, fill #0
  Filled 2020-09-02: qty 120, 30d supply, fill #1

## 2020-08-06 ENCOUNTER — Other Ambulatory Visit (HOSPITAL_COMMUNITY): Payer: Self-pay

## 2020-08-27 ENCOUNTER — Encounter: Payer: Self-pay | Admitting: Internal Medicine

## 2020-08-27 ENCOUNTER — Inpatient Hospital Stay (HOSPITAL_BASED_OUTPATIENT_CLINIC_OR_DEPARTMENT_OTHER): Payer: Medicare HMO | Admitting: Internal Medicine

## 2020-08-27 ENCOUNTER — Inpatient Hospital Stay: Payer: Medicare HMO | Attending: Oncology

## 2020-08-27 VITALS — BP 146/94 | HR 90 | Temp 96.2°F | Resp 16 | Ht 66.0 in | Wt 130.0 lb

## 2020-08-27 DIAGNOSIS — E785 Hyperlipidemia, unspecified: Secondary | ICD-10-CM | POA: Insufficient documentation

## 2020-08-27 DIAGNOSIS — Z79899 Other long term (current) drug therapy: Secondary | ICD-10-CM | POA: Diagnosis not present

## 2020-08-27 DIAGNOSIS — D509 Iron deficiency anemia, unspecified: Secondary | ICD-10-CM | POA: Diagnosis not present

## 2020-08-27 DIAGNOSIS — I10 Essential (primary) hypertension: Secondary | ICD-10-CM | POA: Diagnosis not present

## 2020-08-27 DIAGNOSIS — D649 Anemia, unspecified: Secondary | ICD-10-CM

## 2020-08-27 DIAGNOSIS — I700 Atherosclerosis of aorta: Secondary | ICD-10-CM | POA: Diagnosis not present

## 2020-08-27 DIAGNOSIS — E1165 Type 2 diabetes mellitus with hyperglycemia: Secondary | ICD-10-CM | POA: Insufficient documentation

## 2020-08-27 DIAGNOSIS — C7951 Secondary malignant neoplasm of bone: Secondary | ICD-10-CM | POA: Diagnosis not present

## 2020-08-27 DIAGNOSIS — K219 Gastro-esophageal reflux disease without esophagitis: Secondary | ICD-10-CM | POA: Diagnosis not present

## 2020-08-27 DIAGNOSIS — Z7984 Long term (current) use of oral hypoglycemic drugs: Secondary | ICD-10-CM | POA: Insufficient documentation

## 2020-08-27 DIAGNOSIS — E1159 Type 2 diabetes mellitus with other circulatory complications: Secondary | ICD-10-CM | POA: Diagnosis not present

## 2020-08-27 DIAGNOSIS — I7300 Raynaud's syndrome without gangrene: Secondary | ICD-10-CM | POA: Insufficient documentation

## 2020-08-27 DIAGNOSIS — R5383 Other fatigue: Secondary | ICD-10-CM | POA: Diagnosis not present

## 2020-08-27 DIAGNOSIS — C61 Malignant neoplasm of prostate: Secondary | ICD-10-CM

## 2020-08-27 DIAGNOSIS — I2510 Atherosclerotic heart disease of native coronary artery without angina pectoris: Secondary | ICD-10-CM | POA: Insufficient documentation

## 2020-08-27 DIAGNOSIS — E1129 Type 2 diabetes mellitus with other diabetic kidney complication: Secondary | ICD-10-CM | POA: Diagnosis not present

## 2020-08-27 DIAGNOSIS — R809 Proteinuria, unspecified: Secondary | ICD-10-CM | POA: Diagnosis not present

## 2020-08-27 LAB — COMPREHENSIVE METABOLIC PANEL
ALT: 13 U/L (ref 0–44)
AST: 21 U/L (ref 15–41)
Albumin: 3.9 g/dL (ref 3.5–5.0)
Alkaline Phosphatase: 57 U/L (ref 38–126)
Anion gap: 8 (ref 5–15)
BUN: 20 mg/dL (ref 8–23)
CO2: 26 mmol/L (ref 22–32)
Calcium: 9.6 mg/dL (ref 8.9–10.3)
Chloride: 102 mmol/L (ref 98–111)
Creatinine, Ser: 1.08 mg/dL (ref 0.61–1.24)
GFR, Estimated: >60 mL/min (ref >60)
Glucose, Bld: 220 mg/dL — ABNORMAL HIGH (ref 70–99)
Potassium: 4.9 mmol/L (ref 3.5–5.1)
Sodium: 136 mmol/L (ref 135–145)
Total Bilirubin: 0.8 mg/dL (ref 0.3–1.2)
Total Protein: 7.8 g/dL (ref 6.5–8.1)

## 2020-08-27 LAB — CBC WITH DIFFERENTIAL/PLATELET
Abs Immature Granulocytes: 0.05 10*3/uL (ref 0.00–0.07)
Basophils Absolute: 0 10*3/uL (ref 0.0–0.1)
Basophils Relative: 1 %
Eosinophils Absolute: 0.1 10*3/uL (ref 0.0–0.5)
Eosinophils Relative: 1 %
HCT: 36.9 % — ABNORMAL LOW (ref 39.0–52.0)
Hemoglobin: 12.2 g/dL — ABNORMAL LOW (ref 13.0–17.0)
Immature Granulocytes: 1 %
Lymphocytes Relative: 28 %
Lymphs Abs: 1.6 10*3/uL (ref 0.7–4.0)
MCH: 30.7 pg (ref 26.0–34.0)
MCHC: 33.1 g/dL (ref 30.0–36.0)
MCV: 92.9 fL (ref 80.0–100.0)
Monocytes Absolute: 0.6 10*3/uL (ref 0.1–1.0)
Monocytes Relative: 10 %
Neutro Abs: 3.4 10*3/uL (ref 1.7–7.7)
Neutrophils Relative %: 59 %
Platelets: 249 10*3/uL (ref 150–400)
RBC: 3.97 MIL/uL — ABNORMAL LOW (ref 4.22–5.81)
RDW: 12.6 % (ref 11.5–15.5)
WBC: 5.7 10*3/uL (ref 4.0–10.5)
nRBC: 0 % (ref 0.0–0.2)

## 2020-08-27 LAB — PSA: Prostatic Specific Antigen: 0.07 ng/mL (ref 0.00–4.00)

## 2020-08-27 NOTE — Progress Notes (Signed)
[place] OFFICE PROGRESS NOTE  Patient Care Team: [name], MD as PCP - General (Internal Medicine) Wellington [name], MD as PCP - Cardiology (Cardiology)  Cancer Staging No matching staging information was found for the patient.   Oncology History Overview Note  # FEB 2019-Metastatic prostate cancer/ castrate sensitive [bulky retroperitoneal adenopathy; invasion into the bladder; rectum] Bladder Bx- prostate. On Degarelix  # April 3rd 2019- [name] [134md];  Lupron q 6 M [[id number]]; MID AUG 2019- reduced dose to 879mday  # Poorly controlled DM-on insulin  # s/p right PCN [explanted]/ Foley cath [Dr.Brandon]; multiple UTIs. [s/p ID; Dr.Ravishankar]  ----------------------------------------------------------------- DIAGNOSIS: [ FEB 2019] MET. PROSTATE CA  STAGE:   IV ;GOALS: PALLIATIVE  CURRENT/MOST RECENT THERAPY [ Feb-April2019] Lupron+ [name]    [facility]     INTERVAL HISTORY:  Michael OSUNA89.o.  male pleasant patient [name]/ELigard is here for follow-up.  [place]  He continues plan Xtandi.  Denies any worsening back pain or joint pains.  No new bladder infections.  Continues to evaluated blood glucose secondary to dietary indiscretion   Review of Systems  Constitutional:  Positive for malaise/fatigue. Negative for chills, diaphoresis, fever and weight loss.  HENT:  Negative for nosebleeds and sore throat.   Eyes:  Negative for double vision.  Respiratory:  Negative for cough, hemoptysis, sputum production and wheezing.   Cardiovascular:  Negative for chest pain, palpitations, orthopnea and leg swelling.  Gastrointestinal:  Negative for abdominal pain, blood in stool, constipation, diarrhea, heartburn, melena, nausea and vomiting.  Musculoskeletal:  Positive for back pain and  joint pain.  Skin: Negative.  Negative for itching and rash.  Neurological:  Negative for dizziness, tingling, focal weakness, weakness and headaches.  Endo/Heme/Allergies:  Does not bruise/bleed easily.  Psychiatric/Behavioral:  Negative for depression. The patient is not nervous/anxious and does not have insomnia.      PAST MEDICAL HISTORY :  Past Medical History:  Diagnosis Date   Anemia    iron deficiency   Aortic atherosclerosis (HCC)    Bilateral carotid artery disease (HCC)    Mild plaque formation without obstructive disease noted on carotid Doppler.   Diabetes mellitus without complication ([facility]   Essential hypertension    GERD (gastroesophageal reflux disease)    Hyperlipidemia    Hypertension    Left carotid artery stenosis    Nonobstructive Coronary artery disease    a. [facility] The patient was told about 2 blockages which did not require revascularization; b. 01/2015 Cath: LM nl, LAD 10p/m, 4590m D2 30ost, LCX nl, RCA min irregs, EF 55-65%; b. 12/2018 MV: No ischemia/infarct. CT images w/ mod 3 vessel Cor Ca2+ and mild-mod Ao atherosclerosis.   [place]   Raynaud disease    a. Managed w/ nifedipine.    PAST SURGICAL HISTORY :   Past Surgical History:  Procedure Laterality Date   CARDIAC CATHETERIZATION  2010   CARDIAC CATHETERIZATION N/A 02/03/2015   Procedure: Left Heart Cath and Coronary Angiography;  Surgeon: MuhWellington HampshireD;  Location: [facility];  Service: Cardiovascular;  Laterality: N/A;   CATARACT EXTRACTION Bilateral    CYSTOSCOPY W/ RETROGRADES Bilateral 04/11/2017   Procedure: CYSTOSCOPY WITH RETROGRADE PYELOGRAM;  Surgeon: BraHollice EspyD;  Location: [facility];  Service: Urology;  Laterality: Bilateral;  CYSTOSCOPY W/ RETROGRADES Bilateral 09/12/2017   Procedure: CYSTOSCOPY WITH RETROGRADE PYELOGRAM;  Surgeon: [name], MD;  Location: [facility];  Service: Urology;   Laterality: Bilateral;   CYSTOSCOPY W/ RETROGRADES Bilateral 02/25/2018   Procedure: CYSTOSCOPY WITH RETROGRADE PYELOGRAM;  Surgeon: [name], MD;  Location: [facility];  Service: Urology;  Laterality: Bilateral;   CYSTOSCOPY W/ URETERAL STENT PLACEMENT Bilateral 09/12/2017   Procedure: CYSTOSCOPY WITH STENT REPLACEMENT;  Surgeon: [name], MD;  Location: [facility];  Service: Urology;  Laterality: Bilateral;   CYSTOSCOPY W/ URETERAL STENT REMOVAL Bilateral 02/25/2018   Procedure: CYSTOSCOPY WITH STENT REMOVAL;  Surgeon: [name], MD;  Location: [facility];  Service: Urology;  Laterality: Bilateral;   CYSTOSCOPY WITH STENT PLACEMENT Left 04/11/2017   Procedure: CYSTOSCOPY WITH STENT PLACEMENT and fulgeration;  Surgeon: [name], MD;  Location: [facility];  Service: Urology;  Laterality: Left;   CYSTOSCOPY WITH URETHRAL DILATATION Bilateral 04/11/2017   Procedure: CYSTOSCOPY WITH URETHRAL DILATATION;  Surgeon: [name], MD;  Location: [facility];  Service: Urology;  Laterality: Bilateral;   IR CONVERT RIGHT NEPHROSTOMY TO NEPHROURETERAL CATH  05/21/2017   IR NEPHROSTOMY PLACEMENT RIGHT  04/13/2017    FAMILY HISTORY :   Family History  Problem Relation Age of Onset   Hypertension Father     SOCIAL HISTORY:   Social History   Tobacco Use   Smoking status: Never   Smokeless tobacco: Never  Vaping Use   Vaping Use: Never used  Substance Use Topics   Alcohol use: No   Drug use: No    ALLERGIES:  has No Known Allergies.  MEDICATIONS:  Current Outpatient Medications  Medication Sig Dispense Refill   acetaminophen (TYLENOL) 500 MG tablet Take 500 mg by mouth every 4 (four) hours as needed for moderate pain or fever.     albuterol (PROVENTIL HFA;VENTOLIN HFA) 108 (90 Base) MCG/ACT inhaler Inhale 2 puffs into the lungs every 4 (four) hours as needed for wheezing or shortness of breath.     atorvastatin (LIPITOR) 10 MG tablet Take 10 mg by mouth daily at 6 PM.       Calcium Carb-Cholecalciferol (CALCIUM 600+D3) 600-800 MG-UNIT TABS Take 1 tablet by mouth every evening.      Calcium Carbonate-Vitamin D 600-200 MG-UNIT TABS Take by mouth.     COVID-19 mRNA Vac-TriS, Pfizer, (PFIZER-BIONT COVID-19 VAC-TRIS) SUSP injection Inject into the muscle. 0.3 mL 0   enzalutamide (XTANDI) 40 MG capsule TAKE 2 CAPSULES (80 MG TOTAL) BY MOUTH DAILY. 60 capsule 3   enzalutamide (XTANDI) 40 MG tablet Take 4 tablets (160 mg total) by mouth daily. 120 tablet 3   glimepiride (AMARYL) 2 MG tablet Take 2 mg by mouth daily with breakfast.     glucose blood (ACCU-CHEK GUIDE) test strip Use asdirected three times a day diag E11.65 100 each 3   ibuprofen (ADVIL,MOTRIN) 200 MG tablet Take 200 mg by mouth every 6 (six) hours as needed for fever or moderate pain.      iron polysaccharides (NIFEREX) 150 MG capsule Take 150 mg by mouth daily.      lisinopril (ZESTRIL) 2.5 MG tablet Take 2.5 mg by mouth daily.     metFORMIN (GLUCOPHAGE) 1000 MG tablet Take 1,000 mg by mouth 2 (two) times daily.     metoprolol succinate (TOPROL-XL) 25 MG 24 hr tablet TAKE 1 TABLET BY MOUTH EVERY DAY FOR BLOOD PRESSURE 90 tablet 3   mirabegron ER (MYRBETRIQ) 25 MG TB24 tablet Take 1 tablet (25 mg  total) by mouth daily. 90 tablet 3   Multiple Vitamins-Minerals (MULTIVITAMIN WITH MINERALS) tablet Take 1 tablet by mouth daily.      NIFEdipine (ADALAT CC) 30 MG 24 hr tablet TAKE 1 TABLET BY MOUTH EVERY DAY 90 tablet 2   omeprazole (PRILOSEC) 20 MG capsule TAKE ONE CAPSULE BY MOUTH EVERY DAY 90 capsule 2   No current facility-administered medications for this visit.    PHYSICAL EXAMINATION: ECOG PERFORMANCE STATUS: 1 - Symptomatic but completely ambulatory  BP (!) 146/94 (BP Location: Right Arm, Patient Position: Sitting, Cuff Size: Normal)   Pulse 90   Temp (!) 96.2 F (35.7 C) (Tympanic)   Resp 16   Ht _0  (1.676 m)   Wt 130 lb (59 kg)   SpO2 100%   BMI 20.98 kg/m   Filed Weights   08/27/20 0854   Weight: 130 lb (59 kg)    Physical Exam Constitutional:      Comments: Accompanied by his daughter.  Walking himself.  HENT:     Head: Normocephalic and atraumatic.     Mouth/Throat:     Pharynx: No oropharyngeal exudate.  Eyes:     Pupils: Pupils are equal, round, and reactive to light.  Cardiovascular:     Rate and Rhythm: Normal rate and regular rhythm.  Pulmonary:     Effort: Pulmonary effort is normal. No respiratory distress.     Breath sounds: Normal breath sounds. No wheezing.  Abdominal:     General: Bowel sounds are normal. There is no distension.     Palpations: Abdomen is soft. There is no mass.     Tenderness: no abdominal tenderness There is no guarding or rebound.  Musculoskeletal:        General: No tenderness. Normal range of motion.     Cervical back: Normal range of motion and neck supple.  Skin:    General: Skin is warm.  Neurological:     Mental Status: He is alert and oriented to person, place, and time.  Psychiatric:        Mood and Affect: Affect normal.     LABORATORY DATA:  I have reviewed the data as listed    Component Value Date/Time   NA 136 08/27/2020 0831   NA 136 05/07/2012 1328   K 4.9 08/27/2020 0831   K 4.5 05/07/2012 1328   CL 102 08/27/2020 0831   CL 104 05/07/2012 1328   CO2 26 08/27/2020 0831   CO2 28 05/07/2012 1328   GLUCOSE 220 (H) 08/27/2020 0831   GLUCOSE 73 05/07/2012 1328   BUN 20 08/27/2020 0831   BUN 16 05/07/2012 1328   CREATININE 1.08 08/27/2020 0831   CREATININE 0.84 05/07/2012 1328   CALCIUM 9.6 08/27/2020 0831   CALCIUM 8.8 05/07/2012 1328   PROT 7.8 08/27/2020 0831   ALBUMIN 3.9 08/27/2020 0831   AST 21 08/27/2020 0831   ALT 13 08/27/2020 0831   ALKPHOS 57 08/27/2020 0831   BILITOT 0.8 08/27/2020 0831   GFRNONAA >60 08/27/2020 0831   GFRNONAA >60 05/07/2012 1328   GFRAA >60 11/17/2019 0819   GFRAA >60 05/07/2012 1328    No results found for: SPEP, UPEP  Lab Results  Component Value Date   WBC  5.7 08/27/2020   NEUTROABS 3.4 08/27/2020   HGB 12.2 (L) 08/27/2020   HCT 36.9 (L) 08/27/2020   MCV 92.9 08/27/2020   PLT 249 08/27/2020      Chemistry      Component Value Date/Time  NA 136 08/27/2020 0831   NA 136 05/07/2012 1328   K 4.9 08/27/2020 0831   K 4.5 05/07/2012 1328   CL 102 08/27/2020 0831   CL 104 05/07/2012 1328   CO2 26 08/27/2020 0831   CO2 28 05/07/2012 1328   BUN 20 08/27/2020 0831   BUN 16 05/07/2012 1328   CREATININE 1.08 08/27/2020 0831   CREATININE 0.84 05/07/2012 1328      Component Value Date/Time   CALCIUM 9.6 08/27/2020 0831   CALCIUM 8.8 05/07/2012 1328   ALKPHOS 57 08/27/2020 0831   AST 21 08/27/2020 0831   ALT 13 08/27/2020 0831   BILITOT 0.8 08/27/2020 0831       RADIOGRAPHIC STUDIES: I have personally reviewed the radiological images as listed and agreed with the findings in the report. No results found.   ASSESSMENT & PLAN:  Prostate cancer [facility]) # Metastatic prostate cancer/ castrate sensitive [bulky retroperitoneal adenopathy; invasion into the bladder; rectum]. Eligard [2mq 629mlast 03/10/2019; On Xtandi; February 2020-CT scan shows continued significant improvement of the retroperitoneal adenopathy; bladder/rectal invasion.;  Diffuse diffuse osseous metastatic disease- Clinically stable.   #Continue Xtandi 80 mg a day [secondary to fatigue]; FEB 2022-STABLE; 0.08; reviewed the slightly rising PSA; however since patient is clinically doing well continue current therapy.  # Anemia-iron deficiency-hemoglobin ~12- STABLE;  on PO iron.   # DM-type II- on metformin-STABLE; BG-220- post BF.  # DISPOSITION:  # Lupron in 1 month # Follow up in 2 month-MD/labs-cbc/cmp/psa;  [[place]/friday early AM appts]Dr.B   Orders Placed This Encounter  Procedures   CBC with Differential/Platelet    Standing Status:   Future    Standing Expiration Date:   08/27/2021   Comprehensive metabolic panel    Standing Status:   Future    Standing  Expiration Date:   08/27/2021   PSA    Standing Status:   Future    Standing Expiration Date:   08/27/2021   All questions were answered. The patient knows to call the clinic with any problems, questions or concerns.      GoCammie SickleMD 08/29/2020 8:23 PM

## 2020-08-27 NOTE — Assessment & Plan Note (Signed)
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 Eligard [44m q 33m; last 03/10/2019; On Xtandi; February 2020-CT scan shows continued significant improvement of the retroperitoneal adenopathy; bladder/rectal invasion.;  Diffuse diffuse osseous metastatic disease- Clinically stable.   #Continue Xtandi 80 mg a day [secondary to fatigue]; FEB 2022-STABLE; 0.08; reviewed the slightly rising PSA; however since patient is clinically doing well continue current therapy.  # Anemia-iron deficiency-hemoglobin ~12- STABLE;  on PO iron.   # DM-type II- on metformin-STABLE; BG-220- post BF.  # DISPOSITION:  # Lupron in 1 month # Follow up in 2 month-MD/labs-cbc/cmp/psa;  [[place]/friday early AM appts]Dr.B

## 2020-08-29 ENCOUNTER — Encounter: Payer: Self-pay | Admitting: Internal Medicine

## 2020-08-30 DIAGNOSIS — E119 Type 2 diabetes mellitus without complications: Secondary | ICD-10-CM | POA: Diagnosis not present

## 2020-08-30 DIAGNOSIS — C61 Malignant neoplasm of prostate: Secondary | ICD-10-CM | POA: Diagnosis not present

## 2020-08-30 DIAGNOSIS — D649 Anemia, unspecified: Secondary | ICD-10-CM | POA: Diagnosis not present

## 2020-08-30 DIAGNOSIS — I10 Essential (primary) hypertension: Secondary | ICD-10-CM | POA: Diagnosis not present

## 2020-08-31 ENCOUNTER — Other Ambulatory Visit (HOSPITAL_COMMUNITY): Payer: Self-pay

## 2020-09-02 ENCOUNTER — Other Ambulatory Visit (HOSPITAL_COMMUNITY): Payer: Self-pay

## 2020-09-24 ENCOUNTER — Other Ambulatory Visit: Payer: Self-pay

## 2020-09-24 ENCOUNTER — Inpatient Hospital Stay: Payer: Medicare HMO | Attending: Internal Medicine

## 2020-09-24 DIAGNOSIS — Z79818 Long term (current) use of other agents affecting estrogen receptors and estrogen levels: Secondary | ICD-10-CM | POA: Insufficient documentation

## 2020-09-24 DIAGNOSIS — C61 Malignant neoplasm of prostate: Secondary | ICD-10-CM

## 2020-09-24 MED ORDER — LEUPROLIDE ACETATE (6 MONTH) 45 MG ~~LOC~~ KIT
45.0000 mg | PACK | Freq: Once | SUBCUTANEOUS | Status: AC
  Administered 2020-09-24: 45 mg via SUBCUTANEOUS
  Filled 2020-09-24: qty 45

## 2020-09-29 ENCOUNTER — Other Ambulatory Visit: Payer: Self-pay | Admitting: Internal Medicine

## 2020-09-29 ENCOUNTER — Other Ambulatory Visit (HOSPITAL_COMMUNITY): Payer: Self-pay

## 2020-09-29 DIAGNOSIS — C61 Malignant neoplasm of prostate: Secondary | ICD-10-CM

## 2020-09-29 MED ORDER — ENZALUTAMIDE 40 MG PO TABS
80.0000 mg | ORAL_TABLET | Freq: Every day | ORAL | 3 refills | Status: DC
  Filled 2020-09-29 – 2020-10-05 (×2): qty 60, 30d supply, fill #0
  Filled 2020-11-04: qty 60, 30d supply, fill #1
  Filled 2021-01-17: qty 60, 30d supply, fill #2
  Filled 2021-02-07: qty 60, 30d supply, fill #3

## 2020-09-29 MED ORDER — ENZALUTAMIDE 40 MG PO TABS
160.0000 mg | ORAL_TABLET | Freq: Every day | ORAL | 3 refills | Status: DC
  Filled 2020-09-29: qty 120, 30d supply, fill #0

## 2020-09-29 NOTE — Addendum Note (Signed)
Addended by: Darl Pikes on: 09/29/2020 12:46 PM   Modules accepted: Orders

## 2020-10-05 ENCOUNTER — Other Ambulatory Visit (HOSPITAL_COMMUNITY): Payer: Self-pay

## 2020-10-22 ENCOUNTER — Inpatient Hospital Stay: Payer: Medicare HMO | Attending: Oncology

## 2020-10-22 ENCOUNTER — Inpatient Hospital Stay (HOSPITAL_BASED_OUTPATIENT_CLINIC_OR_DEPARTMENT_OTHER): Payer: Medicare HMO | Admitting: Internal Medicine

## 2020-10-22 ENCOUNTER — Encounter: Payer: Self-pay | Admitting: Internal Medicine

## 2020-10-22 DIAGNOSIS — I700 Atherosclerosis of aorta: Secondary | ICD-10-CM | POA: Diagnosis not present

## 2020-10-22 DIAGNOSIS — M2550 Pain in unspecified joint: Secondary | ICD-10-CM | POA: Diagnosis not present

## 2020-10-22 DIAGNOSIS — I7300 Raynaud's syndrome without gangrene: Secondary | ICD-10-CM | POA: Insufficient documentation

## 2020-10-22 DIAGNOSIS — Z79899 Other long term (current) drug therapy: Secondary | ICD-10-CM | POA: Insufficient documentation

## 2020-10-22 DIAGNOSIS — R5383 Other fatigue: Secondary | ICD-10-CM | POA: Diagnosis not present

## 2020-10-22 DIAGNOSIS — M549 Dorsalgia, unspecified: Secondary | ICD-10-CM | POA: Insufficient documentation

## 2020-10-22 DIAGNOSIS — I10 Essential (primary) hypertension: Secondary | ICD-10-CM | POA: Diagnosis not present

## 2020-10-22 DIAGNOSIS — K219 Gastro-esophageal reflux disease without esophagitis: Secondary | ICD-10-CM | POA: Diagnosis not present

## 2020-10-22 DIAGNOSIS — E785 Hyperlipidemia, unspecified: Secondary | ICD-10-CM | POA: Insufficient documentation

## 2020-10-22 DIAGNOSIS — C61 Malignant neoplasm of prostate: Secondary | ICD-10-CM

## 2020-10-22 DIAGNOSIS — Z7984 Long term (current) use of oral hypoglycemic drugs: Secondary | ICD-10-CM | POA: Diagnosis not present

## 2020-10-22 DIAGNOSIS — D649 Anemia, unspecified: Secondary | ICD-10-CM

## 2020-10-22 DIAGNOSIS — D509 Iron deficiency anemia, unspecified: Secondary | ICD-10-CM | POA: Insufficient documentation

## 2020-10-22 DIAGNOSIS — I2510 Atherosclerotic heart disease of native coronary artery without angina pectoris: Secondary | ICD-10-CM | POA: Insufficient documentation

## 2020-10-22 DIAGNOSIS — R590 Localized enlarged lymph nodes: Secondary | ICD-10-CM | POA: Diagnosis not present

## 2020-10-22 DIAGNOSIS — E1165 Type 2 diabetes mellitus with hyperglycemia: Secondary | ICD-10-CM | POA: Diagnosis not present

## 2020-10-22 LAB — COMPREHENSIVE METABOLIC PANEL
ALT: 12 U/L (ref 0–44)
AST: 22 U/L (ref 15–41)
Albumin: 4.2 g/dL (ref 3.5–5.0)
Alkaline Phosphatase: 47 U/L (ref 38–126)
Anion gap: 8 (ref 5–15)
BUN: 17 mg/dL (ref 8–23)
CO2: 27 mmol/L (ref 22–32)
Calcium: 9.4 mg/dL (ref 8.9–10.3)
Chloride: 100 mmol/L (ref 98–111)
Creatinine, Ser: 1.11 mg/dL (ref 0.61–1.24)
GFR, Estimated: >60 mL/min (ref >60)
Glucose, Bld: 163 mg/dL — ABNORMAL HIGH (ref 70–99)
Potassium: 4.8 mmol/L (ref 3.5–5.1)
Sodium: 135 mmol/L (ref 135–145)
Total Bilirubin: 0.4 mg/dL (ref 0.3–1.2)
Total Protein: 7.9 g/dL (ref 6.5–8.1)

## 2020-10-22 LAB — CBC WITH DIFFERENTIAL/PLATELET
Abs Immature Granulocytes: 0.02 10*3/uL (ref 0.00–0.07)
Basophils Absolute: 0 10*3/uL (ref 0.0–0.1)
Basophils Relative: 1 %
Eosinophils Absolute: 0.1 10*3/uL (ref 0.0–0.5)
Eosinophils Relative: 2 %
HCT: 35.5 % — ABNORMAL LOW (ref 39.0–52.0)
Hemoglobin: 11.6 g/dL — ABNORMAL LOW (ref 13.0–17.0)
Immature Granulocytes: 0 %
Lymphocytes Relative: 32 %
Lymphs Abs: 1.5 10*3/uL (ref 0.7–4.0)
MCH: 30.6 pg (ref 26.0–34.0)
MCHC: 32.7 g/dL (ref 30.0–36.0)
MCV: 93.7 fL (ref 80.0–100.0)
Monocytes Absolute: 0.5 10*3/uL (ref 0.1–1.0)
Monocytes Relative: 11 %
Neutro Abs: 2.6 10*3/uL (ref 1.7–7.7)
Neutrophils Relative %: 54 %
Platelets: 235 10*3/uL (ref 150–400)
RBC: 3.79 MIL/uL — ABNORMAL LOW (ref 4.22–5.81)
RDW: 12.6 % (ref 11.5–15.5)
WBC: 4.8 10*3/uL (ref 4.0–10.5)
nRBC: 0 % (ref 0.0–0.2)

## 2020-10-22 LAB — PSA: Prostatic Specific Antigen: 0.11 ng/mL (ref 0.00–4.00)

## 2020-10-22 NOTE — Assessment & Plan Note (Signed)
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 Eligard [77mq 626mlast 03/10/2019; On Xtandi; February 2020-CT scan shows continued significant improvement of the retroperitoneal adenopathy; bladder/rectal invasion.;  Diffuse diffuse osseous metastatic disease- Clinically STABLE.   #Continue Xtandi 80 mg a day [secondary to fatigue]; FEB 2022-STABLE; 0.07; reviewed the slightly rising PSA; however since patient is clinically doing well continue current therapy. STABLE.   # Anemia-iron deficiency-hemoglobin ~11-12- STABLE.  on PO iron.   # DM-type II- on metformin-STABLE.  BG-163 post BF.  # DISPOSITION:   # Follow up in 2 month-MD/labs-cbc/cmp/psa;  [[place]/friday early AM appts]Dr.B

## 2020-10-26 ENCOUNTER — Other Ambulatory Visit (HOSPITAL_COMMUNITY): Payer: Self-pay

## 2020-10-27 ENCOUNTER — Encounter: Payer: Self-pay | Admitting: Internal Medicine

## 2020-10-27 NOTE — Progress Notes (Signed)
[place] OFFICE PROGRESS NOTE  Patient Care Team: [name], MD as PCP - General (Internal Medicine) Wellington [name], MD as PCP - Cardiology (Cardiology)  Cancer Staging No matching staging information was found for the patient.   Oncology History Overview Note  # FEB 2019-Metastatic prostate cancer/ castrate sensitive [bulky retroperitoneal adenopathy; invasion into the bladder; rectum] Bladder Bx- prostate. On Degarelix  # April 3rd 2019- [name] [125md];  Lupron q 6 M [[id number]]; MID AUG 2019- reduced dose to 838mday  # Poorly controlled DM-on insulin  # s/p right PCN [explanted]/ Foley cath [Dr.Brandon]; multiple UTIs. [s/p ID; Dr.Ravishankar]  ----------------------------------------------------------------- DIAGNOSIS: [ FEB 2019] MET. PROSTATE CA  STAGE:   IV ;GOALS: PALLIATIVE  CURRENT/MOST RECENT THERAPY [ Feb-April2019] Lupron+ [name]    [facility]     INTERVAL HISTORY:  Michael BERKEL89.o.  male pleasant patient [name]/ELigard is here for follow-up.  Denies any worsening joint pains or back pain.  Appetite is good.  No weight loss.  No new bladder infections.   Review of Systems  Constitutional:  Positive for malaise/fatigue. Negative for chills, diaphoresis, fever and weight loss.  HENT:  Negative for nosebleeds and sore throat.   Eyes:  Negative for double vision.  Respiratory:  Negative for cough, hemoptysis, sputum production and wheezing.   Cardiovascular:  Negative for chest pain, palpitations, orthopnea and leg swelling.  Gastrointestinal:  Negative for abdominal pain, blood in stool, constipation, diarrhea, heartburn, melena, nausea and vomiting.  Musculoskeletal:  Positive for back pain and joint pain.  Skin: Negative.  Negative for itching and rash.  Neurological:  Negative for dizziness, tingling, focal weakness, weakness  and headaches.  Endo/Heme/Allergies:  Does not bruise/bleed easily.  Psychiatric/Behavioral:  Negative for depression. The patient is not nervous/anxious and does not have insomnia.      PAST MEDICAL HISTORY :  Past Medical History:  Diagnosis Date   Anemia    iron deficiency   Aortic atherosclerosis (HCC)    Bilateral carotid artery disease (HCC)    Mild plaque formation without obstructive disease noted on carotid Doppler.   Diabetes mellitus without complication ([place]   Essential hypertension    GERD (gastroesophageal reflux disease)    Hyperlipidemia    Hypertension    Left carotid artery stenosis    Nonobstructive Coronary artery disease    a. [facility] The patient was told about 2 blockages which did not require revascularization; b. 01/2015 Cath: LM nl, LAD 10p/m, 4535m D2 30ost, LCX nl, RCA min irregs, EF 55-65%; b. 12/2018 MV: No ischemia/infarct. CT images w/ mod 3 vessel Cor Ca2+ and mild-mod Ao atherosclerosis.   Prostate cancer ([facility]   Raynaud disease    a. Managed w/ nifedipine.    PAST SURGICAL HISTORY :   Past Surgical History:  Procedure Laterality Date   CARDIAC CATHETERIZATION  2010   CARDIAC CATHETERIZATION N/A 02/03/2015   Procedure: Left Heart Cath and Coronary Angiography;  Surgeon: MuhWellington HampshireD;  Location: [facility];  Service: Cardiovascular;  Laterality: N/A;   CATARACT EXTRACTION Bilateral    CYSTOSCOPY W/ RETROGRADES Bilateral 04/11/2017   Procedure: CYSTOSCOPY WITH RETROGRADE PYELOGRAM;  Surgeon: BraHollice EspyD;  Location: [facility];  Service: Urology;  Laterality: Bilateral;   CYSTOSCOPY W/ RETROGRADES Bilateral 09/12/2017   Procedure: CYSTOSCOPY WITH RETROGRADE PYELOGRAM;  Surgeon: BraHollice EspyD;  Location:  [facility];  Service: Urology;  Laterality: Bilateral;   CYSTOSCOPY W/ RETROGRADES Bilateral 02/25/2018   Procedure: CYSTOSCOPY WITH RETROGRADE PYELOGRAM;  Surgeon: [name], MD;  Location: [facility];  Service: Urology;  Laterality: Bilateral;   CYSTOSCOPY W/ URETERAL STENT PLACEMENT Bilateral 09/12/2017   Procedure: CYSTOSCOPY WITH STENT REPLACEMENT;  Surgeon: [name], MD;  Location: [facility];  Service: Urology;  Laterality: Bilateral;   CYSTOSCOPY W/ URETERAL STENT REMOVAL Bilateral 02/25/2018   Procedure: CYSTOSCOPY WITH STENT REMOVAL;  Surgeon: [name], MD;  Location: [facility];  Service: Urology;  Laterality: Bilateral;   CYSTOSCOPY WITH STENT PLACEMENT Left 04/11/2017   Procedure: CYSTOSCOPY WITH STENT PLACEMENT and fulgeration;  Surgeon: [name], MD;  Location: [facility];  Service: Urology;  Laterality: Left;   CYSTOSCOPY WITH URETHRAL DILATATION Bilateral 04/11/2017   Procedure: CYSTOSCOPY WITH URETHRAL DILATATION;  Surgeon: [name], MD;  Location: [facility];  Service: Urology;  Laterality: Bilateral;   IR CONVERT RIGHT NEPHROSTOMY TO NEPHROURETERAL CATH  05/21/2017   IR NEPHROSTOMY PLACEMENT RIGHT  04/13/2017    FAMILY HISTORY :   Family History  Problem Relation Age of Onset   Hypertension Father     SOCIAL HISTORY:   Social History   Tobacco Use   Smoking status: Never   Smokeless tobacco: Never  Vaping Use   Vaping Use: Never used  Substance Use Topics   Alcohol use: No   Drug use: No    ALLERGIES:  has No Known Allergies.  MEDICATIONS:  Current Outpatient Medications  Medication Sig Dispense Refill   acetaminophen (TYLENOL) 500 MG tablet Take 500 mg by mouth every 4 (four) hours as needed for moderate pain or fever.     albuterol (PROVENTIL HFA;VENTOLIN HFA) 108 (90 Base) MCG/ACT inhaler Inhale 2 puffs into the lungs every 4 (four) hours as needed for wheezing or shortness of breath.     atorvastatin (LIPITOR) 10 MG tablet Take 10 mg by mouth daily at 6 PM.      Calcium Carb-Cholecalciferol (CALCIUM 600+D3) 600-800 MG-UNIT TABS Take 1 tablet by mouth every evening.      Calcium Carbonate-Vitamin D 600-200  MG-UNIT TABS Take by mouth.     COVID-19 mRNA Vac-TriS, Pfizer, (PFIZER-BIONT COVID-19 VAC-TRIS) SUSP injection Inject into the muscle. 0.3 mL 0   enzalutamide (XTANDI) 40 MG tablet Take 2 tablets (80 mg total) by mouth daily. 60 tablet 3   glimepiride (AMARYL) 2 MG tablet Take 2 mg by mouth daily with breakfast.     glucose blood (ACCU-CHEK GUIDE) test strip Use asdirected three times a day diag E11.65 100 each 3   ibuprofen (ADVIL,MOTRIN) 200 MG tablet Take 200 mg by mouth every 6 (six) hours as needed for fever or moderate pain.      iron polysaccharides (NIFEREX) 150 MG capsule Take 150 mg by mouth daily.      lisinopril (ZESTRIL) 2.5 MG tablet Take 2.5 mg by mouth daily.     metFORMIN (GLUCOPHAGE) 1000 MG tablet Take 1,000 mg by mouth 2 (two) times daily.     metoprolol succinate (TOPROL-XL) 25 MG 24 hr tablet TAKE 1 TABLET BY MOUTH EVERY DAY FOR BLOOD PRESSURE 90 tablet 3   mirabegron ER (MYRBETRIQ) 25 MG TB24 tablet Take 1 tablet (25 mg total) by mouth daily. 90 tablet 3   Multiple Vitamins-Minerals (MULTIVITAMIN WITH MINERALS) tablet Take 1 tablet by mouth daily.      NIFEdipine (ADALAT CC) 30 MG 24 hr tablet TAKE 1 TABLET BY  MOUTH EVERY DAY 90 tablet 2   omeprazole (PRILOSEC) 20 MG capsule TAKE ONE CAPSULE BY MOUTH EVERY DAY 90 capsule 2   omeprazole (PRILOSEC) 20 MG capsule Take 1 tablet by mouth daily.     No current facility-administered medications for this visit.    PHYSICAL EXAMINATION: ECOG PERFORMANCE STATUS: 1 - Symptomatic but completely ambulatory  BP 125/77 (BP Location: Left Arm, Patient Position: Sitting, Cuff Size: Normal)   Pulse 72   Temp (!) 95.9 F (35.5 C) (Tympanic)   Resp 16   Ht '5\' 6"'  (1.676 m)   Wt 129 lb (58.5 kg)   SpO2 100%   BMI 20.82 kg/m   Filed Weights   10/22/20 0945  Weight: 129 lb (58.5 kg)    Physical Exam Constitutional:      Comments: Accompanied by his daughter.  Walking himself.  HENT:     Head: Normocephalic and atraumatic.      Mouth/Throat:     Pharynx: No oropharyngeal exudate.  Eyes:     Pupils: Pupils are equal, round, and reactive to light.  Cardiovascular:     Rate and Rhythm: Normal rate and regular rhythm.  Pulmonary:     Effort: Pulmonary effort is normal. No respiratory distress.     Breath sounds: Normal breath sounds. No wheezing.  Abdominal:     General: Bowel sounds are normal. There is no distension.     Palpations: Abdomen is soft. There is no mass.     Tenderness: There is no abdominal tenderness. There is no guarding or rebound.  Musculoskeletal:        General: No tenderness. Normal range of motion.     Cervical back: Normal range of motion and neck supple.  Skin:    General: Skin is warm.  Neurological:     Mental Status: He is alert and oriented to person, place, and time.  Psychiatric:        Mood and Affect: Affect normal.     LABORATORY DATA:  I have reviewed the data as listed    Component Value Date/Time   NA 135 10/22/2020 0855   NA 136 05/07/2012 1328   K 4.8 10/22/2020 0855   K 4.5 05/07/2012 1328   CL 100 10/22/2020 0855   CL 104 05/07/2012 1328   CO2 27 10/22/2020 0855   CO2 28 05/07/2012 1328   GLUCOSE 163 (H) 10/22/2020 0855   GLUCOSE 73 05/07/2012 1328   BUN 17 10/22/2020 0855   BUN 16 05/07/2012 1328   CREATININE 1.11 10/22/2020 0855   CREATININE 0.84 05/07/2012 1328   CALCIUM 9.4 10/22/2020 0855   CALCIUM 8.8 05/07/2012 1328   PROT 7.9 10/22/2020 0855   ALBUMIN 4.2 10/22/2020 0855   AST 22 10/22/2020 0855   ALT 12 10/22/2020 0855   ALKPHOS 47 10/22/2020 0855   BILITOT 0.4 10/22/2020 0855   GFRNONAA >60 10/22/2020 0855   GFRNONAA >60 05/07/2012 1328   GFRAA >60 11/17/2019 0819   GFRAA >60 05/07/2012 1328    No results found for: SPEP, UPEP  Lab Results  Component Value Date   WBC 4.8 10/22/2020   NEUTROABS 2.6 10/22/2020   HGB 11.6 (L) 10/22/2020   HCT 35.5 (L) 10/22/2020   MCV 93.7 10/22/2020   PLT 235 10/22/2020      Chemistry       Component Value Date/Time   NA 135 10/22/2020 0855   NA 136 05/07/2012 1328   K 4.8 10/22/2020 0855   K 4.5  05/07/2012 1328   CL 100 10/22/2020 0855   CL 104 05/07/2012 1328   CO2 27 10/22/2020 0855   CO2 28 05/07/2012 1328   BUN 17 10/22/2020 0855   BUN 16 05/07/2012 1328   CREATININE 1.11 10/22/2020 0855   CREATININE 0.84 05/07/2012 1328      Component Value Date/Time   CALCIUM 9.4 10/22/2020 0855   CALCIUM 8.8 05/07/2012 1328   ALKPHOS 47 10/22/2020 0855   AST 22 10/22/2020 0855   ALT 12 10/22/2020 0855   BILITOT 0.4 10/22/2020 0855       RADIOGRAPHIC STUDIES: I have personally reviewed the radiological images as listed and agreed with the findings in the report. No results found.   ASSESSMENT & PLAN:  Prostate cancer [facility]) # Metastatic prostate cancer/ castrate sensitive [bulky retroperitoneal adenopathy; invasion into the bladder; rectum]. Eligard [27mq 636mlast 03/10/2019; On Xtandi; February 2020-CT scan shows continued significant improvement of the retroperitoneal adenopathy; bladder/rectal invasion.;  Diffuse diffuse osseous metastatic disease- Clinically STABLE.   #Continue Xtandi 80 mg a day [secondary to fatigue]; FEB 2022-STABLE; 0.07; reviewed the slightly rising PSA; however since patient is clinically doing well continue current therapy. STABLE.   # Anemia-iron deficiency-hemoglobin ~11-12- STABLE.  on PO iron.   # DM-type II- on metformin-STABLE.  BG-163 post BF.  # DISPOSITION:   # Follow up in 2 month-MD/labs-cbc/cmp/psa;  [[place]/friday early AM appts]Dr.B    No orders of the defined types were placed in this encounter.  All questions were answered. The patient knows to call the clinic with any problems, questions or concerns.      GoCammie SickleMD 10/27/2020 11:21 PM

## 2020-10-28 ENCOUNTER — Other Ambulatory Visit (HOSPITAL_COMMUNITY): Payer: Self-pay

## 2020-11-04 ENCOUNTER — Other Ambulatory Visit (HOSPITAL_COMMUNITY): Payer: Self-pay

## 2020-11-09 ENCOUNTER — Other Ambulatory Visit (HOSPITAL_COMMUNITY): Payer: Self-pay

## 2020-12-06 ENCOUNTER — Other Ambulatory Visit (HOSPITAL_COMMUNITY): Payer: Self-pay

## 2020-12-09 DIAGNOSIS — C61 Malignant neoplasm of prostate: Secondary | ICD-10-CM | POA: Diagnosis not present

## 2020-12-09 DIAGNOSIS — M5489 Other dorsalgia: Secondary | ICD-10-CM | POA: Diagnosis not present

## 2020-12-09 DIAGNOSIS — M546 Pain in thoracic spine: Secondary | ICD-10-CM | POA: Diagnosis not present

## 2020-12-09 DIAGNOSIS — J449 Chronic obstructive pulmonary disease, unspecified: Secondary | ICD-10-CM | POA: Diagnosis not present

## 2020-12-10 ENCOUNTER — Other Ambulatory Visit (HOSPITAL_COMMUNITY): Payer: Self-pay

## 2020-12-13 ENCOUNTER — Telehealth: Payer: Self-pay | Admitting: *Deleted

## 2020-12-13 ENCOUNTER — Other Ambulatory Visit (HOSPITAL_COMMUNITY): Payer: Self-pay | Admitting: Physician Assistant

## 2020-12-13 ENCOUNTER — Other Ambulatory Visit: Payer: Self-pay | Admitting: Physician Assistant

## 2020-12-13 ENCOUNTER — Encounter: Payer: Self-pay | Admitting: Internal Medicine

## 2020-12-13 DIAGNOSIS — M549 Dorsalgia, unspecified: Secondary | ICD-10-CM

## 2020-12-13 NOTE — Telephone Encounter (Signed)
Daughter called reporting that [facility] wantsd to do a CT on patient due to seeing something on hte xrays they did. I am unable to see an attachment in Care Everywhere and am working on trying to get into the [employer] thru [address], but am having to get IT to unblock that site so I can log into it. She wants to know if Dr B thinks he should get the CT. His xray was of the T spine and L spine. I have called [place] [facility] department to request they these results to [place] ASAP. Please advise

## 2020-12-13 NOTE — Telephone Encounter (Signed)
Patient's daughter called to let Dr. Rogue Bussing know that patient has been having back pain, and saw his primary care doctor, who ordered a CT Scan of the chest without contrast to be done. Her question is, can Dr. Rogue Bussing go ahead and order his other CT Scans, so he can have them both done at the same time.

## 2020-12-13 NOTE — Progress Notes (Signed)
Spoke to patient's daughter regarding her concerns for additional imaging. For now recommend CT scan chest ordered by Dr. Ginette Pitman. Recommend she informs [place] off the CT scan.

## 2020-12-14 ENCOUNTER — Encounter: Payer: Self-pay | Admitting: Internal Medicine

## 2020-12-14 ENCOUNTER — Encounter: Payer: Self-pay | Admitting: Physician Assistant

## 2020-12-14 ENCOUNTER — Telehealth: Payer: Self-pay | Admitting: Internal Medicine

## 2020-12-14 NOTE — Telephone Encounter (Signed)
On 10/25-I spoke to patient's daughter regarding her concerns for obtaining CT scan ordered by PCP.  Recommend proceeding with scan as recommended.  We will follow-up after the results of the CT scan.

## 2020-12-16 ENCOUNTER — Encounter: Payer: Self-pay | Admitting: Internal Medicine

## 2020-12-17 ENCOUNTER — Other Ambulatory Visit: Payer: Self-pay

## 2020-12-17 ENCOUNTER — Ambulatory Visit
Admission: RE | Admit: 2020-12-17 | Discharge: 2020-12-17 | Disposition: A | Discharge status: Home / Self Care | Payer: Medicare HMO | Source: Ambulatory Visit | Attending: Physician Assistant | Admitting: Physician Assistant

## 2020-12-17 DIAGNOSIS — R911 Solitary pulmonary nodule: Secondary | ICD-10-CM | POA: Diagnosis not present

## 2020-12-17 DIAGNOSIS — R918 Other nonspecific abnormal finding of lung field: Secondary | ICD-10-CM | POA: Diagnosis not present

## 2020-12-17 DIAGNOSIS — R079 Chest pain, unspecified: Secondary | ICD-10-CM | POA: Diagnosis not present

## 2020-12-17 DIAGNOSIS — M549 Dorsalgia, unspecified: Secondary | ICD-10-CM | POA: Insufficient documentation

## 2020-12-17 DIAGNOSIS — M899 Disorder of bone, unspecified: Secondary | ICD-10-CM | POA: Diagnosis not present

## 2020-12-20 ENCOUNTER — Other Ambulatory Visit: Payer: Self-pay | Admitting: *Deleted

## 2020-12-20 DIAGNOSIS — C61 Malignant neoplasm of prostate: Secondary | ICD-10-CM

## 2020-12-24 ENCOUNTER — Encounter: Payer: Self-pay | Admitting: Internal Medicine

## 2020-12-24 ENCOUNTER — Other Ambulatory Visit: Payer: Self-pay

## 2020-12-24 ENCOUNTER — Inpatient Hospital Stay: Payer: Medicare HMO

## 2020-12-24 ENCOUNTER — Inpatient Hospital Stay: Payer: Medicare HMO | Attending: Internal Medicine

## 2020-12-24 ENCOUNTER — Inpatient Hospital Stay (HOSPITAL_BASED_OUTPATIENT_CLINIC_OR_DEPARTMENT_OTHER): Payer: Medicare HMO | Admitting: Internal Medicine

## 2020-12-24 DIAGNOSIS — C61 Malignant neoplasm of prostate: Secondary | ICD-10-CM

## 2020-12-24 DIAGNOSIS — Z7984 Long term (current) use of oral hypoglycemic drugs: Secondary | ICD-10-CM | POA: Insufficient documentation

## 2020-12-24 DIAGNOSIS — E785 Hyperlipidemia, unspecified: Secondary | ICD-10-CM | POA: Insufficient documentation

## 2020-12-24 DIAGNOSIS — E1165 Type 2 diabetes mellitus with hyperglycemia: Secondary | ICD-10-CM | POA: Insufficient documentation

## 2020-12-24 DIAGNOSIS — Z79899 Other long term (current) drug therapy: Secondary | ICD-10-CM | POA: Insufficient documentation

## 2020-12-24 DIAGNOSIS — Z794 Long term (current) use of insulin: Secondary | ICD-10-CM

## 2020-12-24 DIAGNOSIS — K219 Gastro-esophageal reflux disease without esophagitis: Secondary | ICD-10-CM | POA: Insufficient documentation

## 2020-12-24 DIAGNOSIS — R531 Weakness: Secondary | ICD-10-CM | POA: Diagnosis not present

## 2020-12-24 DIAGNOSIS — Z23 Encounter for immunization: Secondary | ICD-10-CM | POA: Diagnosis not present

## 2020-12-24 DIAGNOSIS — E119 Type 2 diabetes mellitus without complications: Secondary | ICD-10-CM

## 2020-12-24 DIAGNOSIS — R5383 Other fatigue: Secondary | ICD-10-CM | POA: Diagnosis not present

## 2020-12-24 DIAGNOSIS — D509 Iron deficiency anemia, unspecified: Secondary | ICD-10-CM | POA: Insufficient documentation

## 2020-12-24 DIAGNOSIS — I10 Essential (primary) hypertension: Secondary | ICD-10-CM | POA: Insufficient documentation

## 2020-12-24 LAB — CBC WITH DIFFERENTIAL/PLATELET
Abs Immature Granulocytes: 0.02 10*3/uL (ref 0.00–0.07)
Basophils Absolute: 0 10*3/uL (ref 0.0–0.1)
Basophils Relative: 0 %
Eosinophils Absolute: 0 10*3/uL (ref 0.0–0.5)
Eosinophils Relative: 1 %
HCT: 35.3 % — ABNORMAL LOW (ref 39.0–52.0)
Hemoglobin: 11.7 g/dL — ABNORMAL LOW (ref 13.0–17.0)
Immature Granulocytes: 0 %
Lymphocytes Relative: 27 %
Lymphs Abs: 1.3 10*3/uL (ref 0.7–4.0)
MCH: 31.3 pg (ref 26.0–34.0)
MCHC: 33.1 g/dL (ref 30.0–36.0)
MCV: 94.4 fL (ref 80.0–100.0)
Monocytes Absolute: 0.5 10*3/uL (ref 0.1–1.0)
Monocytes Relative: 11 %
Neutro Abs: 3 10*3/uL (ref 1.7–7.7)
Neutrophils Relative %: 61 %
Platelets: 231 10*3/uL (ref 150–400)
RBC: 3.74 MIL/uL — ABNORMAL LOW (ref 4.22–5.81)
RDW: 12.1 % (ref 11.5–15.5)
WBC: 4.9 10*3/uL (ref 4.0–10.5)
nRBC: 0 % (ref 0.0–0.2)

## 2020-12-24 LAB — COMPREHENSIVE METABOLIC PANEL
ALT: 13 U/L (ref 0–44)
AST: 21 U/L (ref 15–41)
Albumin: 4 g/dL (ref 3.5–5.0)
Alkaline Phosphatase: 66 U/L (ref 38–126)
Anion gap: 6 (ref 5–15)
BUN: 19 mg/dL (ref 8–23)
CO2: 28 mmol/L (ref 22–32)
Calcium: 8.7 mg/dL — ABNORMAL LOW (ref 8.9–10.3)
Chloride: 98 mmol/L (ref 98–111)
Creatinine, Ser: 1.04 mg/dL (ref 0.61–1.24)
GFR, Estimated: >60 mL/min (ref >60)
Glucose, Bld: 292 mg/dL — ABNORMAL HIGH (ref 70–99)
Potassium: 4.5 mmol/L (ref 3.5–5.1)
Sodium: 132 mmol/L — ABNORMAL LOW (ref 135–145)
Total Bilirubin: 0.6 mg/dL (ref 0.3–1.2)
Total Protein: 7.5 g/dL (ref 6.5–8.1)

## 2020-12-24 LAB — PSA: Prostatic Specific Antigen: 0.16 ng/mL (ref 0.00–4.00)

## 2020-12-24 LAB — HEMOGLOBIN A1C
Hgb A1c MFr Bld: 7.2 % — ABNORMAL HIGH (ref 4.8–5.6)
Mean Plasma Glucose: 159.94 mg/dL

## 2020-12-24 MED ORDER — INFLUENZA VAC A&B SA ADJ QUAD 0.5 ML IM PRSY
0.5000 mL | PREFILLED_SYRINGE | Freq: Once | INTRAMUSCULAR | Status: AC
  Administered 2020-12-24: 0.5 mL via INTRAMUSCULAR
  Filled 2020-12-24: qty 0.5

## 2020-12-24 NOTE — Progress Notes (Signed)
[place] OFFICE PROGRESS NOTE  Patient Care Team: [name], MD as PCP - General (Internal Medicine) Wellington [name], MD as PCP - Cardiology (Cardiology)  Cancer Staging No matching staging information was found for the patient.   Oncology History Overview Note  # FEB 2019-Metastatic prostate cancer/ castrate sensitive [bulky retroperitoneal adenopathy; invasion into the bladder; rectum] Bladder Bx- prostate. On Degarelix  # April 3rd 2019- [name] [134md];  Lupron q 6 M [[id number]]; MID AUG 2019- reduced dose to 880mday  # Poorly controlled DM-on insulin  # s/p right PCN [explanted]/ Foley cath [Dr.Brandon]; multiple UTIs. [s/p ID; Dr.Ravishankar]  ----------------------------------------------------------------- DIAGNOSIS: [ FEB 2019] MET. PROSTATE CA  STAGE:   IV ;GOALS: PALLIATIVE  CURRENT/MOST RECENT THERAPY [ Feb-April2019] Lupron+ [name]    [facility]     INTERVAL HISTORY:  Michael MCNAY89.o.  male pleasant patient [name]/ELigard is here for follow-up.  In the interim patient was evaluated by primary care physician for ongoing back pain.  CT scan chest showed-stable sclerotic lesions in the bone.  Pain improved after starting meloxicam.  Denies any worsening joint pains or back pain.  Appetite is good.  No weight loss.  No new bladder infections.  Review of Systems  Constitutional:  Positive for malaise/fatigue. Negative for chills, diaphoresis, fever and weight loss.  HENT:  Negative for nosebleeds and sore throat.   Eyes:  Negative for double vision.  Respiratory:  Negative for cough, hemoptysis, sputum production and wheezing.   Cardiovascular:  Negative for chest pain, palpitations, orthopnea and leg swelling.  Gastrointestinal:  Negative for abdominal pain, blood in stool, constipation, diarrhea, heartburn, melena, nausea and  vomiting.  Musculoskeletal:  Positive for back pain and joint pain.  Skin: Negative.  Negative for itching and rash.  Neurological:  Negative for dizziness, tingling, focal weakness, weakness and headaches.  Endo/Heme/Allergies:  Does not bruise/bleed easily.  Psychiatric/Behavioral:  Negative for depression. The patient is not nervous/anxious and does not have insomnia.      PAST MEDICAL HISTORY :  Past Medical History:  Diagnosis Date   Anemia    iron deficiency   Aortic atherosclerosis (HCC)    Bilateral carotid artery disease (HCC)    Mild plaque formation without obstructive disease noted on carotid Doppler.   Diabetes mellitus without complication ([facility]   Essential hypertension    GERD (gastroesophageal reflux disease)    Hyperlipidemia    Hypertension    Left carotid artery stenosis    Nonobstructive Coronary artery disease    a. [facility]. The patient was told about 2 blockages which did not require revascularization; b. 01/2015 Cath: LM nl, LAD 10p/m, 4555m D2 30ost, LCX nl, RCA min irregs, EF 55-65%; b. 12/2018 MV: No ischemia/infarct. CT images w/ mod 3 vessel Cor Ca2+ and mild-mod Ao atherosclerosis.   [place]   Raynaud disease    a. Managed w/ nifedipine.    PAST SURGICAL HISTORY :   Past Surgical History:  Procedure Laterality Date   CARDIAC CATHETERIZATION  2010   CARDIAC CATHETERIZATION N/A 02/03/2015   Procedure: Left Heart Cath and Coronary Angiography;  Surgeon: MuhWellington HampshireD;  Location: [facility];  Service: Cardiovascular;  Laterality: N/A;   CATARACT EXTRACTION Bilateral    CYSTOSCOPY W/ RETROGRADES Bilateral 04/11/2017   Procedure: CYSTOSCOPY WITH RETROGRADE PYELOGRAM;  Surgeon: BraHollice EspyD;  Location: [facility];  Service: Urology;  Laterality: Bilateral;   CYSTOSCOPY W/ RETROGRADES Bilateral 09/12/2017   Procedure: CYSTOSCOPY WITH RETROGRADE PYELOGRAM;  Surgeon: [name], MD;  Location: [facility];  Service: Urology;  Laterality: Bilateral;   CYSTOSCOPY W/ RETROGRADES Bilateral 02/25/2018   Procedure: CYSTOSCOPY WITH RETROGRADE PYELOGRAM;  Surgeon: [name], MD;  Location: [facility];  Service: Urology;  Laterality: Bilateral;   CYSTOSCOPY W/ URETERAL STENT PLACEMENT Bilateral 09/12/2017   Procedure: CYSTOSCOPY WITH STENT REPLACEMENT;  Surgeon: [name], MD;  Location: [facility];  Service: Urology;  Laterality: Bilateral;   CYSTOSCOPY W/ URETERAL STENT REMOVAL Bilateral 02/25/2018   Procedure: CYSTOSCOPY WITH STENT REMOVAL;  Surgeon: [name], MD;  Location: [facility];  Service: Urology;  Laterality: Bilateral;   CYSTOSCOPY WITH STENT PLACEMENT Left 04/11/2017   Procedure: CYSTOSCOPY WITH STENT PLACEMENT and fulgeration;  Surgeon: [name], MD;  Location: [facility];  Service: Urology;  Laterality: Left;   CYSTOSCOPY WITH URETHRAL DILATATION Bilateral 04/11/2017   Procedure: CYSTOSCOPY WITH URETHRAL DILATATION;  Surgeon: [name], MD;  Location: [facility];  Service: Urology;  Laterality: Bilateral;   IR CONVERT RIGHT NEPHROSTOMY TO NEPHROURETERAL CATH  05/21/2017   IR NEPHROSTOMY PLACEMENT RIGHT  04/13/2017    FAMILY HISTORY :   Family History  Problem Relation Age of Onset   Hypertension Father     SOCIAL HISTORY:   Social History   Tobacco Use   Smoking status: Never   Smokeless tobacco: Never  Vaping Use   Vaping Use: Never used  Substance Use Topics   Alcohol use: No   Drug use: No    ALLERGIES:  has No Known Allergies.  MEDICATIONS:  Current Outpatient Medications  Medication Sig Dispense Refill   acetaminophen (TYLENOL) 500 MG tablet Take 500 mg by mouth every 4 (four) hours as needed for moderate pain or fever.     albuterol (PROVENTIL HFA;VENTOLIN HFA) 108 (90 Base) MCG/ACT inhaler Inhale 2 puffs into the lungs every 4 (four) hours as needed for wheezing or shortness of breath.     atorvastatin (LIPITOR) 10 MG  tablet Take 10 mg by mouth daily at 6 PM.      Calcium Carb-Cholecalciferol (CALCIUM 600+D3) 600-800 MG-UNIT TABS Take 1 tablet by mouth every evening.      Calcium Carbonate-Vitamin D 600-200 MG-UNIT TABS Take by mouth.     COVID-19 mRNA Vac-TriS, Pfizer, (PFIZER-BIONT COVID-19 VAC-TRIS) SUSP injection Inject into the muscle. 0.3 mL 0   enzalutamide (XTANDI) 40 MG tablet Take 2 tablets (80 mg total) by mouth daily. 60 tablet 3   glimepiride (AMARYL) 2 MG tablet Take 2 mg by mouth daily with breakfast.     glucose blood (ACCU-CHEK GUIDE) test strip Use asdirected three times a day diag E11.65 100 each 3   ibuprofen (ADVIL,MOTRIN) 200 MG tablet Take 200 mg by mouth every 6 (six) hours as needed for fever or moderate pain.      iron polysaccharides (NIFEREX) 150 MG capsule Take 150 mg by mouth daily.      lisinopril (ZESTRIL) 2.5 MG tablet Take 2.5 mg by mouth daily.     metFORMIN (GLUCOPHAGE) 1000 MG tablet Take 1,000 mg by mouth 2 (two) times daily.     metoprolol succinate (TOPROL-XL) 25 MG 24 hr tablet TAKE 1 TABLET BY MOUTH EVERY DAY FOR BLOOD PRESSURE 90 tablet 3   mirabegron ER (MYRBETRIQ) 25 MG TB24 tablet Take 1 tablet (25 mg total) by mouth daily. 90 tablet 3  Multiple Vitamins-Minerals (MULTIVITAMIN WITH MINERALS) tablet Take 1 tablet by mouth daily.      NIFEdipine (ADALAT CC) 30 MG 24 hr tablet TAKE 1 TABLET BY MOUTH EVERY DAY 90 tablet 2   omeprazole (PRILOSEC) 20 MG capsule TAKE ONE CAPSULE BY MOUTH EVERY DAY 90 capsule 2   omeprazole (PRILOSEC) 20 MG capsule Take 1 tablet by mouth daily.     No current facility-administered medications for this visit.    PHYSICAL EXAMINATION: ECOG PERFORMANCE STATUS: 1 - Symptomatic but completely ambulatory  BP 139/76   Pulse 80   Temp 97.7 F (36.5 C)   Resp 16   Wt 129 lb 3.2 oz (58.6 kg)   SpO2 100%   BMI 20.85 kg/m   Filed Weights   12/24/20 0823  Weight: 129 lb 3.2 oz (58.6 kg)    Physical Exam Constitutional:       Comments: Accompanied by his daughter.  Walking himself.  HENT:     Head: Normocephalic and atraumatic.     Mouth/Throat:     Pharynx: No oropharyngeal exudate.  Eyes:     Pupils: Pupils are equal, round, and reactive to light.  Cardiovascular:     Rate and Rhythm: Normal rate and regular rhythm.  Pulmonary:     Effort: Pulmonary effort is normal. No respiratory distress.     Breath sounds: Normal breath sounds. No wheezing.  Abdominal:     General: Bowel sounds are normal. There is no distension.     Palpations: Abdomen is soft. There is no mass.     Tenderness: There is no abdominal tenderness. There is no guarding or rebound.  Musculoskeletal:        General: No tenderness. Normal range of motion.     Cervical back: Normal range of motion and neck supple.  Skin:    General: Skin is warm.  Neurological:     Mental Status: He is alert and oriented to person, place, and time.  Psychiatric:        Mood and Affect: Affect normal.     LABORATORY DATA:  I have reviewed the data as listed    Component Value Date/Time   NA 132 (L) 12/24/2020 0756   NA 136 05/07/2012 1328   K 4.5 12/24/2020 0756   K 4.5 05/07/2012 1328   CL 98 12/24/2020 0756   CL 104 05/07/2012 1328   CO2 28 12/24/2020 0756   CO2 28 05/07/2012 1328   GLUCOSE 292 (H) 12/24/2020 0756   GLUCOSE 73 05/07/2012 1328   BUN 19 12/24/2020 0756   BUN 16 05/07/2012 1328   CREATININE 1.04 12/24/2020 0756   CREATININE 0.84 05/07/2012 1328   CALCIUM 8.7 (L) 12/24/2020 0756   CALCIUM 8.8 05/07/2012 1328   PROT 7.5 12/24/2020 0756   ALBUMIN 4.0 12/24/2020 0756   AST 21 12/24/2020 0756   ALT 13 12/24/2020 0756   ALKPHOS 66 12/24/2020 0756   BILITOT 0.6 12/24/2020 0756   GFRNONAA >60 12/24/2020 0756   GFRNONAA >60 05/07/2012 1328   GFRAA >60 11/17/2019 0819   GFRAA >60 05/07/2012 1328    No results found for: SPEP, UPEP  Lab Results  Component Value Date   WBC 4.9 12/24/2020   NEUTROABS 3.0 12/24/2020   HGB  11.7 (L) 12/24/2020   HCT 35.3 (L) 12/24/2020   MCV 94.4 12/24/2020   PLT 231 12/24/2020      Chemistry      Component Value Date/Time   NA 132 (L) 12/24/2020  0756   NA 136 05/07/2012 1328   K 4.5 12/24/2020 0756   K 4.5 05/07/2012 1328   CL 98 12/24/2020 0756   CL 104 05/07/2012 1328   CO2 28 12/24/2020 0756   CO2 28 05/07/2012 1328   BUN 19 12/24/2020 0756   BUN 16 05/07/2012 1328   CREATININE 1.04 12/24/2020 0756   CREATININE 0.84 05/07/2012 1328      Component Value Date/Time   CALCIUM 8.7 (L) 12/24/2020 0756   CALCIUM 8.8 05/07/2012 1328   ALKPHOS 66 12/24/2020 0756   AST 21 12/24/2020 0756   ALT 13 12/24/2020 0756   BILITOT 0.6 12/24/2020 0756       RADIOGRAPHIC STUDIES: I have personally reviewed the radiological images as listed and agreed with the findings in the report. No results found.   ASSESSMENT & PLAN:  Prostate cancer [facility]) # Metastatic prostate cancer/ castrate sensitive [bulky retroperitoneal adenopathy; invasion into the bladder; rectum]. Eligard [68mq 675mlast 03/10/2019; On Xtandi; February 2020-CT scan shows continued significant improvement of the retroperitoneal adenopathy; bladder/rectal invasion.;  Diffuse diffuse osseous metastatic disease- Clinically STABLE.   #Continue Xtandi 80 mg a day [secondary to fatigue]; SEP 2022-STABLE; 0.11 reviewed the slightly rising PSA; however since patient is clinically doing well continue current therapy. STABLE.   # Anemia-iron deficiency-hemoglobin ~11-12- STABLE.  on PO iron.   # DM-type II- on metformin-STABLE [Dr.Solum].  BG-292 post BF; check A1C.   # Acute back pain- s/p meloxicam- Improved; OCT 2022- CT- no acute findings noted; sclerotic bone lesions- stable.   # DISPOSITION:  # flu shot today # Follow up in 2nd week of FEB, 2023 MD/labs-cbc/cmp/psa; ElSharyn DrossMonda/friday early AM appts]Dr.B  # I reviewed the blood work- with the patient in detail; also reviewed the imaging independently [as  summarized above]; and with the patient in detail.      No orders of the defined types were placed in this encounter.  All questions were answered. The patient knows to call the clinic with any problems, questions or concerns.      GoCammie SickleMD 12/24/2020 10:28 AM

## 2020-12-24 NOTE — Assessment & Plan Note (Signed)
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 Eligard [30m q 36m; last 03/10/2019; On Xtandi; February 2020-CT scan shows continued significant improvement of the retroperitoneal adenopathy; bladder/rectal invasion.;  Diffuse diffuse osseous metastatic disease- Clinically STABLE.   #Continue Xtandi 80 mg a day [secondary to fatigue]; SEP 2022-STABLE; 0.11 reviewed the slightly rising PSA; however since patient is clinically doing well continue current therapy. STABLE.   # Anemia-iron deficiency-hemoglobin ~11-12- STABLE.  on PO iron.   # DM-type II- on metformin-STABLE [Dr.Solum].  BG-292 post BF; check A1C.   # Acute back pain- s/p meloxicam- Improved; OCT 2022- CT- no acute findings noted; sclerotic bone lesions- stable.   # DISPOSITION:  # flu shot today # Follow up in 2nd week of FEB, 2023 MD/labs-cbc/cmp/psa; Sharyn Dross [[place]/friday early AM appts]Dr.B  # I reviewed the blood work- with the patient in detail; also reviewed the imaging independently [as summarized above]; and with the patient in detail.

## 2020-12-24 NOTE — Progress Notes (Signed)
Pt has no concerns/complaints at this time. 

## 2020-12-31 DIAGNOSIS — E1129 Type 2 diabetes mellitus with other diabetic kidney complication: Secondary | ICD-10-CM | POA: Diagnosis not present

## 2020-12-31 DIAGNOSIS — I10 Essential (primary) hypertension: Secondary | ICD-10-CM | POA: Diagnosis not present

## 2020-12-31 DIAGNOSIS — R809 Proteinuria, unspecified: Secondary | ICD-10-CM | POA: Diagnosis not present

## 2020-12-31 DIAGNOSIS — E1159 Type 2 diabetes mellitus with other circulatory complications: Secondary | ICD-10-CM | POA: Diagnosis not present

## 2021-01-11 ENCOUNTER — Other Ambulatory Visit (HOSPITAL_COMMUNITY): Payer: Self-pay

## 2021-01-17 ENCOUNTER — Other Ambulatory Visit (HOSPITAL_COMMUNITY): Payer: Self-pay

## 2021-01-18 ENCOUNTER — Other Ambulatory Visit (HOSPITAL_COMMUNITY): Payer: Self-pay

## 2021-02-07 ENCOUNTER — Other Ambulatory Visit (HOSPITAL_COMMUNITY): Payer: Self-pay

## 2021-02-10 ENCOUNTER — Other Ambulatory Visit (HOSPITAL_COMMUNITY): Payer: Self-pay

## 2021-03-04 ENCOUNTER — Other Ambulatory Visit (HOSPITAL_COMMUNITY): Payer: Self-pay

## 2021-03-07 ENCOUNTER — Other Ambulatory Visit: Payer: Self-pay | Admitting: Internal Medicine

## 2021-03-07 ENCOUNTER — Encounter: Payer: Self-pay | Admitting: Internal Medicine

## 2021-03-07 ENCOUNTER — Other Ambulatory Visit (HOSPITAL_COMMUNITY): Payer: Self-pay

## 2021-03-07 DIAGNOSIS — C61 Malignant neoplasm of prostate: Secondary | ICD-10-CM

## 2021-03-07 MED ORDER — ENZALUTAMIDE 40 MG PO TABS
80.0000 mg | ORAL_TABLET | Freq: Every day | ORAL | 3 refills | Status: DC
  Filled 2021-03-07: qty 60, 30d supply, fill #0
  Filled 2021-03-31 – 2021-04-28 (×2): qty 60, 30d supply, fill #1
  Filled 2021-05-17: qty 60, 30d supply, fill #2
  Filled 2021-06-14: qty 60, 30d supply, fill #3

## 2021-03-07 NOTE — Telephone Encounter (Signed)
CBC with Differential Order: [id number] Status: Final result    Visible to patient: Yes (seen)    Next appt: 04/01/2021 at 08:00 AM in Oncology ([facility])    Dx: Prostate cancer ([place])    0 Result Notes           Component Ref Range & Units 2 mo ago (12/24/20) 4 mo ago (10/22/20) 6 mo ago (08/27/20) 9 mo ago (05/28/20) 11 mo ago (03/26/20) 1 yr ago (01/23/20) 1 yr ago (11/17/19)  WBC 4.0 - 10.5 K/uL 4.9  4.8  5.7  6.0  4.8  5.8  6.1   RBC 4.22 - 5.81 MIL/uL 3.74 Low   3.79 Low   3.97 Low   3.78 Low   3.85 Low   4.07 Low   3.96 Low    Hemoglobin 13.0 - 17.0 g/dL 11.7 Low   11.6 Low   12.2 Low   12.2 Low   11.9 Low   12.5 Low   12.2 Low    HCT 39.0 - 52.0 % 35.3 Low   35.5 Low   36.9 Low   35.6 Low   34.9 Low   37.4 Low   36.5 Low    MCV 80.0 - 100.0 fL 94.4  93.7  92.9  94.2  90.6  91.9  92.2   MCH 26.0 - 34.0 pg 31.3  30.6  30.7  32.3  30.9  30.7  30.8   MCHC 30.0 - 36.0 g/dL 33.1  32.7  33.1  34.3  34.1  33.4  33.4   RDW 11.5 - 15.5 % 12.1  12.6  12.6  12.5  12.7  12.3  12.6   Platelets 150 - 400 K/uL 231  235  249  235  253  229  223   nRBC 0.0 - 0.2 % 0.0  0.0  0.0  0.0  0.0  0.0  0.0   Neutrophils Relative % % 61  54  59  58  53  57  63   Neutro Abs 1.7 - 7.7 K/uL 3.0  2.6  3.4  3.5  2.6  3.3  3.9   Lymphocytes Relative % 27  32  28  30  33  30  25   Lymphs Abs 0.7 - 4.0 K/uL 1.3  1.5  1.6  1.8  1.6  1.7  1.6   Monocytes Relative % 11  11  10  10  12  10  9    Monocytes Absolute 0.1 - 1.0 K/uL 0.5  0.5  0.6  0.6  0.6  0.6  0.6   Eosinophils Relative % 1  2  1  1  1  1  1    Eosinophils Absolute 0.0 - 0.5 K/uL 0.0  0.1  0.1  0.0  0.0  0.1  0.1   Basophils Relative % 0  1  1  1   0  1  1   Basophils Absolute 0.0 - 0.1 K/uL 0.0  0.0  0.0  0.0  0.0  0.0  0.0   Immature Granulocytes % 0  0  1  0  1  1  1    Abs Immature Granulocytes 0.00 - 0.07 K/uL 0.02  0.02 CM  0.05 CM  0.02 CM  0.05 CM  0.04 CM  0.04 CM   Comment: Performed at [facility], [address] [place],  [address]   Resulting Agency  [place] [facility] [place] [facility] [place] [facility] [place] [facility]  [place] [facility] [place] [facility] [place] [facility]         Specimen Collected: 12/24/20 07:56 Last Resulted: 12/24/20 08:13      Lab Flowsheet    Order Details    View Encounter    Lab and Collection Details    Routing    Result History    View Encounter Conversation      CM=Additional comments      Result Care Coordination   Patient Communication   Add Comments   Seen Back to Top       Other Results from 12/24/2020   Contains abnormal data Comprehensive metabolic panel Order: [id number] Status: Final result    Visible to patient: Yes (seen)    Next appt: 04/01/2021 at 08:00 AM in Oncology ([facility])    Dx: Prostate cancer ([place])    0 Result Notes           Component Ref Range & Units 2 mo ago (12/24/20) 4 mo ago (10/22/20) 6 mo ago (08/27/20) 9 mo ago (05/28/20) 11 mo ago (03/26/20) 1 yr ago (01/23/20) 1 yr ago (11/17/19)  Sodium 135 - 145 mmol/L 132 Low   135  136  136  135  133 Low   135   Potassium 3.5 - 5.1 mmol/L 4.5  4.8  4.9  4.9  4.3  4.5  4.4   Chloride 98 - 111 mmol/L 98  100  102  102  101  98  101   CO2 22 - 32 mmol/L 28  27  26  25  25  27  26    Glucose, Bld 70 - 99 mg/dL 292 High   163 High  CM  220 High  CM  263 High  CM  199 High  CM  248 High  CM  180 High  CM   Comment: Glucose reference range applies only to samples taken after fasting for at least 8 hours.  BUN 8 - 23 mg/dL 19  17  20  19  18  21  16    Creatinine, Ser 0.61 - 1.24 mg/dL 1.04  1.11  1.08  1.04  0.91  1.07  1.10   Calcium 8.9 - 10.3 mg/dL 8.7 Low   9.4  9.6  9.4  9.2  9.3  9.0   Total Protein 6.5 - 8.1 g/dL 7.5  7.9  7.8  7.8  7.6  8.1  7.6   Albumin 3.5 - 5.0 g/dL 4.0  4.2  3.9  4.0  3.7  4.1  4.1   AST 15 - 41 U/L 21  22  21  21  19  22  20    ALT 0 - 44 U/L 13  12  13  13  12  15  13    Alkaline Phosphatase 38 - 126 U/L 66  47  57  50  54  51  54   Total Bilirubin 0.3 - 1.2 mg/dL 0.6  0.4  0.8  0.5  0.5  0.8  0.6   GFR, Estimated  >60 mL/min >60  >60 CM  >60 CM  >60 CM  >60 CM  >60 CM    Comment: (NOTE)  Calculated using the CKD-EPI Creatinine Equation (2021)   Anion gap 5 - 15 6  8  CM  8 CM  9 CM  9 CM  8 CM  8 CM   Comment: Performed at [facility], [address] [place],  [address]  Resulting Agency  [place] [facility] [place] [facility] [place] [facility] [place] [facility] [place] [facility] [place] [facility] [place] [facility]         Specimen Collected: 12/24/20 07:56 Last Resulted: 12/24/20 08:22      Lab Flowsheet    Order Details    View Encounter    Lab and Collection Details    Routing    Result History    View Encounter Conversation      CM=Additional comments      Result Care Coordination   Patient Communication   Add Comments   Seen Back to Top         PSA Order: [id number] Status: Final result    Visible to patient: Yes (seen)    Next appt: 04/01/2021 at 08:00 AM in Oncology ([facility])    Dx: Prostate cancer ([place])    0 Result Notes           Component Ref Range & Units 2 mo ago (12/24/20) 4 mo ago (10/22/20) 6 mo ago (08/27/20) 9 mo ago (05/28/20) 11 mo ago (03/26/20) 1 yr ago (01/23/20) 1 yr ago (11/17/19)  Prostatic Specific Antigen 0.00 - 4.00 ng/mL 0.16  0.11 CM  0.07 CM  0.08 CM  0.06 CM

## 2021-03-08 ENCOUNTER — Other Ambulatory Visit (HOSPITAL_COMMUNITY): Payer: Self-pay

## 2021-03-08 NOTE — Telephone Encounter (Signed)
Called patient's daughter. She does not feel that this is an urgent problem and is unable to bring her dad into the clinic until Friday. [facility] appointment scheduled for Friday, 1/20. Discussed ER triggers; she verbalized understanding.

## 2021-03-11 ENCOUNTER — Other Ambulatory Visit: Payer: Self-pay

## 2021-03-11 ENCOUNTER — Inpatient Hospital Stay: Payer: Medicare HMO | Attending: Hospice and Palliative Medicine | Admitting: Hospice and Palliative Medicine

## 2021-03-11 ENCOUNTER — Encounter: Payer: Self-pay | Admitting: Internal Medicine

## 2021-03-11 ENCOUNTER — Encounter: Payer: Self-pay | Admitting: *Deleted

## 2021-03-11 ENCOUNTER — Telehealth: Payer: Self-pay | Admitting: *Deleted

## 2021-03-11 VITALS — BP 138/82 | HR 80 | Temp 97.7°F | Resp 14

## 2021-03-11 DIAGNOSIS — R531 Weakness: Secondary | ICD-10-CM | POA: Diagnosis not present

## 2021-03-11 DIAGNOSIS — I10 Essential (primary) hypertension: Secondary | ICD-10-CM | POA: Insufficient documentation

## 2021-03-11 DIAGNOSIS — Z87440 Personal history of urinary (tract) infections: Secondary | ICD-10-CM | POA: Insufficient documentation

## 2021-03-11 DIAGNOSIS — M549 Dorsalgia, unspecified: Secondary | ICD-10-CM | POA: Insufficient documentation

## 2021-03-11 DIAGNOSIS — M25552 Pain in left hip: Secondary | ICD-10-CM | POA: Diagnosis not present

## 2021-03-11 DIAGNOSIS — C61 Malignant neoplasm of prostate: Secondary | ICD-10-CM | POA: Diagnosis not present

## 2021-03-11 DIAGNOSIS — E1165 Type 2 diabetes mellitus with hyperglycemia: Secondary | ICD-10-CM | POA: Diagnosis not present

## 2021-03-11 DIAGNOSIS — G893 Neoplasm related pain (acute) (chronic): Secondary | ICD-10-CM | POA: Diagnosis not present

## 2021-03-11 DIAGNOSIS — E785 Hyperlipidemia, unspecified: Secondary | ICD-10-CM | POA: Insufficient documentation

## 2021-03-11 DIAGNOSIS — Z79899 Other long term (current) drug therapy: Secondary | ICD-10-CM | POA: Insufficient documentation

## 2021-03-11 MED ORDER — TRAMADOL HCL 50 MG PO TABS: 25.0000 mg | ORAL_TABLET | Freq: Two times a day (BID) | ORAL | 0 refills | Status: DC | PRN

## 2021-03-11 NOTE — Telephone Encounter (Signed)
Contacted daughter Hina regarding rehab screening referral with [facility]. Daughter tentatively set up an apt next Wed at 52 am. She normally works in the [employer] lab on Wednesday and will need to find someone to cover her at this time to take her dad to this apt. She will msg me back if she should need a different apt date. She is aware that [employer] only works on Wednesday mornings (b/w [phone] apt times). She gave verbal understanding of the medical necessity of this apt.

## 2021-03-11 NOTE — Progress Notes (Signed)
I Called daughter to check on patients hip pain. Awaiting appointment at the [facility], on Jan 20th.

## 2021-03-11 NOTE — Progress Notes (Signed)
Pt reports pain in left hip with difficulty bearing weight. States that the hip will "catch" sometimes, weakening his leg. Denies any falls or injuries. Pt has taken meloxicam and advil, which somewhat improves the pain. He rates pain 9/10 intermittently.

## 2021-03-11 NOTE — Progress Notes (Addendum)
Symptom Management [place] at [facility] Telephone:([phone]) [phone] [phone]) [phone]  Patient Care Team: [name], MD as PCP - General (Internal Medicine) Wellington [name], MD as PCP - Cardiology (Cardiology)   Name of the patient: Michael [name]  [id number]  08-16-1933   Date of visit: 03/11/21  Reason for Consult:  Michael [name] is an 88 year old man with multiple medical metastatic prostate cancer with invasion into the bladder and rectum on treatment with Lupron plus Xtandi.  Patient is status post right PCN and Foley catheter with history of multiple UTIs.  He has been followed by ID with Dr. Delaine Lame.  Patient last saw Dr. Rogue Bussing on 12/24/2020.  At that time, patient was having ongoing back pain with CT of the chest on 12/09/2020 showed stable widespread sclerotic lesions of the bone.  Pain had improved after starting meloxicam.  Patient presents to [facility] today for evaluation of acute left hip pain.  He reports low back/left hip pain over the past week not associated with any injury or falls.  Pain is worse with ambulation.  He has felt his left leg "give out" due to pain but he denies having fallen.  Pain does not radiate down the leg.  No numbness or tingling.  Of note, patient reports pain in the back has resolved.  Denies any neurologic complaints. Denies recent fevers or illnesses. Denies any easy bleeding or bruising. Reports good appetite and denies weight loss. Denies chest pain. Denies any nausea, vomiting, constipation, or diarrhea. Denies urinary complaints. Patient offers no further specific complaints today.  PAST MEDICAL HISTORY: Past Medical History:  Diagnosis Date   Anemia    iron deficiency   Aortic atherosclerosis (HCC)    Bilateral carotid artery disease (HCC)    Mild plaque formation without obstructive disease noted on carotid Doppler.   Diabetes mellitus without complication ([place])    Essential  hypertension    GERD (gastroesophageal reflux disease)    Hyperlipidemia    Hypertension    Left carotid artery stenosis    Nonobstructive Coronary artery disease    a. Previous cardiac catheterization at [facility] in 2010. The patient was told about 2 blockages which did not require revascularization; b. 01/2015 Cath: LM nl, LAD 10p/m, 43md, D2 30ost, LCX nl, RCA min irregs, EF 55-65%; b. 12/2018 MV: No ischemia/infarct. CT images w/ mod 3 vessel Cor Ca2+ and mild-mod Ao atherosclerosis.   [place]   Raynaud disease    a. Managed w/ nifedipine.    PAST SURGICAL HISTORY:  Past Surgical History:  Procedure Laterality Date   CARDIAC CATHETERIZATION  2010   CARDIAC CATHETERIZATION N/A 02/03/2015   Procedure: Left Heart Cath and Coronary Angiography;  Surgeon: MWellington [name] MD;  [facility];  Service: Cardiovascular;  Laterality: N/A;   CATARACT EXTRACTION Bilateral    CYSTOSCOPY W/ RETROGRADES Bilateral 04/11/2017   Procedure: CYSTOSCOPY WITH RETROGRADE PYELOGRAM;  Surgeon: BHollice Espy MD;  Location: [facility];  Service: Urology;  Laterality: Bilateral;   CYSTOSCOPY W/ RETROGRADES Bilateral 09/12/2017   Procedure: CYSTOSCOPY WITH RETROGRADE PYELOGRAM;  Surgeon: BHollice Espy MD;  Location: [facility];  Service: Urology;  Laterality: Bilateral;   CYSTOSCOPY W/ RETROGRADES Bilateral 02/25/2018   Procedure: CYSTOSCOPY WITH RETROGRADE PYELOGRAM;  Surgeon: BHollice Espy MD;  Location: [facility];  Service: Urology;  Laterality: Bilateral;   CYSTOSCOPY W/ URETERAL STENT PLACEMENT Bilateral 09/12/2017   Procedure: CYSTOSCOPY WITH STENT REPLACEMENT;  Surgeon: BHollice Espy MD;  Location:  [facility];  Service: Urology;  Laterality: Bilateral;   CYSTOSCOPY W/ URETERAL STENT REMOVAL Bilateral 02/25/2018   Procedure: CYSTOSCOPY WITH STENT REMOVAL;  Surgeon: [name], MD;  Location: [facility];  Service: Urology;  Laterality: Bilateral;   CYSTOSCOPY WITH STENT  PLACEMENT Left 04/11/2017   Procedure: CYSTOSCOPY WITH STENT PLACEMENT and fulgeration;  Surgeon: [name], MD;  Location: [facility];  Service: Urology;  Laterality: Left;   CYSTOSCOPY WITH URETHRAL DILATATION Bilateral 04/11/2017   Procedure: CYSTOSCOPY WITH URETHRAL DILATATION;  Surgeon: [name], MD;  Location: [facility];  Service: Urology;  Laterality: Bilateral;   IR CONVERT RIGHT NEPHROSTOMY TO NEPHROURETERAL CATH  05/21/2017   IR NEPHROSTOMY PLACEMENT RIGHT  04/13/2017    HEMATOLOGY/ONCOLOGY HISTORY:  Oncology History Overview Note  # FEB 2019-Metastatic prostate cancer/ castrate sensitive [bulky retroperitoneal adenopathy; invasion into the bladder; rectum] Bladder Bx- prostate. On Degarelix  # April 3rd 2019- [name] [152md];  Lupron q 6 M [[id number]]; MID AUG 2019- reduced dose to 839mday  # Poorly controlled DM-on insulin  # s/p right PCN [explanted]/ Foley cath [Dr.Brandon]; multiple UTIs. [s/p ID; Dr.Ravishankar]  ----------------------------------------------------------------- DIAGNOSIS: [ FEB 2019] MET. PROSTATE CA  STAGE:   IV ;GOALS: PALLIATIVE  CURRENT/MOST RECENT THERAPY [ Feb-April2019] Lupron+ [name]    [place]   ALLERGIES:  has No Known Allergies.  MEDICATIONS:  Current Outpatient Medications  Medication Sig Dispense Refill   acetaminophen (TYLENOL) 500 MG tablet Take 500 mg by mouth every 4 (four) hours as needed for moderate pain or fever.     albuterol (PROVENTIL HFA;VENTOLIN HFA) 108 (90 Base) MCG/ACT inhaler Inhale 2 puffs into the lungs every 4 (four) hours as needed for wheezing or shortness of breath.     atorvastatin (LIPITOR) 10 MG tablet Take 10 mg by mouth daily at 6 PM.      Calcium Carb-Cholecalciferol (CALCIUM 600+D3) 600-800 MG-UNIT TABS Take 1 tablet by mouth every evening.      Calcium Carbonate-Vitamin D 600-200 MG-UNIT TABS Take by mouth.     COVID-19 mRNA Vac-TriS, Pfizer, (PFIZER-BIONT COVID-19 VAC-TRIS)  SUSP injection Inject into the muscle. 0.3 mL 0   enzalutamide (XTANDI) 40 MG tablet Take 2 tablets (80 mg total) by mouth daily. 60 tablet 3   glimepiride (AMARYL) 2 MG tablet Take 2 mg by mouth daily with breakfast.     glucose blood (ACCU-CHEK GUIDE) test strip Use asdirected three times a day diag E11.65 100 each 3   ibuprofen (ADVIL,MOTRIN) 200 MG tablet Take 200 mg by mouth every 6 (six) hours as needed for fever or moderate pain.      iron polysaccharides (NIFEREX) 150 MG capsule Take 150 mg by mouth daily.      lisinopril (ZESTRIL) 2.5 MG tablet Take 2.5 mg by mouth daily.     metFORMIN (GLUCOPHAGE) 1000 MG tablet Take 1,000 mg by mouth 2 (two) times daily.     metoprolol succinate (TOPROL-XL) 25 MG 24 hr tablet TAKE 1 TABLET BY MOUTH EVERY DAY FOR BLOOD PRESSURE 90 tablet 3   mirabegron ER (MYRBETRIQ) 25 MG TB24 tablet Take 1 tablet (25 mg total) by mouth daily. 90 tablet 3   Multiple Vitamins-Minerals (MULTIVITAMIN WITH MINERALS) tablet Take 1 tablet by mouth daily.      NIFEdipine (ADALAT CC) 30 MG 24 hr tablet TAKE 1 TABLET BY MOUTH EVERY DAY 90 tablet 2   omeprazole (PRILOSEC) 20 MG capsule TAKE ONE CAPSULE BY MOUTH EVERY DAY 90 capsule 2   omeprazole (  PRILOSEC) 20 MG capsule Take 1 tablet by mouth daily.     No current facility-administered medications for this visit.    VITAL SIGNS: There were no vitals taken for this visit. There were no vitals filed for this visit.  Estimated body mass index is 20.85 kg/m as calculated from the following:   Height as of 10/22/20: '5\' 6"'  (1.676 m).   Weight as of 12/24/20: 129 lb 3.2 oz (58.6 kg).  LABS: CBC:    Component Value Date/Time   WBC 4.9 12/24/2020 0756   HGB 11.7 (L) 12/24/2020 0756   HGB 13.7 05/07/2012 1328   HCT 35.3 (L) 12/24/2020 0756   PLT 231 12/24/2020 0756   MCV 94.4 12/24/2020 0756   NEUTROABS 3.0 12/24/2020 0756   LYMPHSABS 1.3 12/24/2020 0756   MONOABS 0.5 12/24/2020 0756   EOSABS 0.0 12/24/2020 0756    BASOSABS 0.0 12/24/2020 0756   Comprehensive Metabolic Panel:    Component Value Date/Time   NA 132 (L) 12/24/2020 0756   NA 136 05/07/2012 1328   K 4.5 12/24/2020 0756   K 4.5 05/07/2012 1328   CL 98 12/24/2020 0756   CL 104 05/07/2012 1328   CO2 28 12/24/2020 0756   CO2 28 05/07/2012 1328   BUN 19 12/24/2020 0756   BUN 16 05/07/2012 1328   CREATININE 1.04 12/24/2020 0756   CREATININE 0.84 05/07/2012 1328   GLUCOSE 292 (H) 12/24/2020 0756   GLUCOSE 73 05/07/2012 1328   CALCIUM 8.7 (L) 12/24/2020 0756   CALCIUM 8.8 05/07/2012 1328   AST 21 12/24/2020 0756   ALT 13 12/24/2020 0756   ALKPHOS 66 12/24/2020 0756   BILITOT 0.6 12/24/2020 0756   PROT 7.5 12/24/2020 0756   ALBUMIN 4.0 12/24/2020 0756    RADIOGRAPHIC STUDIES: No results found.  PERFORMANCE STATUS (ECOG) : 2 - Symptomatic, <50% confined to bed  Review of Systems Unless otherwise noted, a complete review of systems is negative.  Physical Exam General: NAD Cardiovascular: regular rate and rhythm Pulmonary: clear ant fields Abdomen: soft, nontender, + bowel sounds GU: no suprapubic tenderness Extremities: no edema, no joint deformities Skin: no rashes Neurological: Weakness but otherwise nonfocal  Assessment and Plan- Patient is a 88 y.o. male metastatic prostate cancer who presents to [facility] for evaluation of back pain  Back pain -symptoms most concerning for neoplasm related pain in setting of known bone metastasis.  Discussed with Dr. Rogue Bussing and will order bone scan to better characterize.  Patient is ambulatory and able to bear weight so doubt pathologic fracture.  May continue meloxicam but will add tramadol as needed for breakthrough pain  Weakness -patient lives at home with his daughter who is providing more care for him at home.  We will order DME including walker and shower chair.  Will refer to [facility] for rehab screening     Durable Medical Equipment  (From admission, onward)            Start     Ordered   03/11/21 0000  For home use only DME 4 wheeled rolling walker with seat       Question:  Patient needs a walker to treat with the following condition  Answer:  Weakness   03/11/21 0934              Case and plan discussed with Dr. Rogue Bussing   Patient expressed understanding and was in agreement with this plan. He also understands that He can call clinic at any time with  any questions, concerns, or complaints.   Thank you for allowing me to participate in the care of this very pleasant patient.   Time Total: 20 minutes  Visit consisted of counseling and education dealing with the complex and emotionally intense issues of symptom management in the setting of serious illness.Greater than 50%  of this time was spent counseling and coordinating care related to the above assessment and plan.  Signed by: Altha Harm, PhD, NP-C

## 2021-03-16 ENCOUNTER — Encounter: Payer: Self-pay | Admitting: *Deleted

## 2021-03-16 ENCOUNTER — Inpatient Hospital Stay: Payer: Medicare HMO | Admitting: Occupational Therapy

## 2021-03-16 ENCOUNTER — Telehealth: Payer: Self-pay | Admitting: *Deleted

## 2021-03-16 NOTE — Telephone Encounter (Signed)
1148 am- Patient late/no show for apt with Rosalyn Gess today. Left detailed vm for daughter, [name], to see if pt can r/s to next week since patient has not yet arrived.

## 2021-03-17 ENCOUNTER — Encounter: Payer: Self-pay | Admitting: Internal Medicine

## 2021-03-21 ENCOUNTER — Ambulatory Visit
Admission: RE | Admit: 2021-03-21 | Discharge: 2021-03-21 | Disposition: A | Discharge status: Home / Self Care | Payer: Medicare HMO | Source: Ambulatory Visit | Attending: Hospice and Palliative Medicine | Admitting: Hospice and Palliative Medicine

## 2021-03-21 ENCOUNTER — Encounter
Admission: RE | Admit: 2021-03-21 | Discharge: 2021-03-21 | Disposition: A | Discharge status: Home / Self Care | Payer: Medicare HMO | Source: Ambulatory Visit | Attending: Hospice and Palliative Medicine | Admitting: Hospice and Palliative Medicine

## 2021-03-21 ENCOUNTER — Other Ambulatory Visit: Payer: Self-pay

## 2021-03-21 DIAGNOSIS — R948 Abnormal results of function studies of other organs and systems: Secondary | ICD-10-CM | POA: Diagnosis not present

## 2021-03-21 DIAGNOSIS — C61 Malignant neoplasm of prostate: Secondary | ICD-10-CM

## 2021-03-21 DIAGNOSIS — C7951 Secondary malignant neoplasm of bone: Secondary | ICD-10-CM | POA: Diagnosis not present

## 2021-03-21 MED ORDER — TECHNETIUM TC 99M MEDRONATE IV KIT
20.0000 | PACK | Freq: Once | INTRAVENOUS | Status: AC | PRN
  Administered 2021-03-21: 22 via INTRAVENOUS

## 2021-03-22 ENCOUNTER — Telehealth: Payer: Self-pay | Admitting: Internal Medicine

## 2021-03-22 DIAGNOSIS — C61 Malignant neoplasm of prostate: Secondary | ICD-10-CM

## 2021-03-22 NOTE — Telephone Encounter (Signed)
Spoke to pt's daughter re: results of bone scan- concerns of progression. Discussed re: referral to Dr.Chrystal.   Please refer to Dr.Chrystal re: prostate cancer/ left hip pain.  Please schedule PET scan asap re: prostate cancer.  Keep appt as planned-GB

## 2021-03-23 ENCOUNTER — Other Ambulatory Visit: Payer: Self-pay

## 2021-03-23 DIAGNOSIS — C61 Malignant neoplasm of prostate: Secondary | ICD-10-CM

## 2021-03-23 NOTE — Telephone Encounter (Signed)
Referral placed to Dr.Chrystal re: prostate cancer/ left hip pain.

## 2021-03-24 ENCOUNTER — Other Ambulatory Visit: Payer: Self-pay | Admitting: *Deleted

## 2021-03-24 DIAGNOSIS — C61 Malignant neoplasm of prostate: Secondary | ICD-10-CM

## 2021-03-31 ENCOUNTER — Other Ambulatory Visit (HOSPITAL_COMMUNITY): Payer: Self-pay

## 2021-04-01 ENCOUNTER — Other Ambulatory Visit: Payer: Self-pay

## 2021-04-01 ENCOUNTER — Inpatient Hospital Stay: Payer: Medicare HMO

## 2021-04-01 ENCOUNTER — Ambulatory Visit
Admission: RE | Admit: 2021-04-01 | Discharge: 2021-04-01 | Disposition: A | Discharge status: Home / Self Care | Payer: Medicare HMO | Source: Ambulatory Visit | Attending: Radiation Oncology | Admitting: Radiation Oncology

## 2021-04-01 ENCOUNTER — Inpatient Hospital Stay (HOSPITAL_BASED_OUTPATIENT_CLINIC_OR_DEPARTMENT_OTHER): Payer: Medicare HMO | Admitting: Internal Medicine

## 2021-04-01 ENCOUNTER — Encounter: Payer: Self-pay | Admitting: Internal Medicine

## 2021-04-01 ENCOUNTER — Inpatient Hospital Stay: Payer: Medicare HMO | Attending: Internal Medicine

## 2021-04-01 DIAGNOSIS — Z79899 Other long term (current) drug therapy: Secondary | ICD-10-CM | POA: Diagnosis not present

## 2021-04-01 DIAGNOSIS — I10 Essential (primary) hypertension: Secondary | ICD-10-CM | POA: Insufficient documentation

## 2021-04-01 DIAGNOSIS — Z79818 Long term (current) use of other agents affecting estrogen receptors and estrogen levels: Secondary | ICD-10-CM | POA: Insufficient documentation

## 2021-04-01 DIAGNOSIS — E785 Hyperlipidemia, unspecified: Secondary | ICD-10-CM | POA: Insufficient documentation

## 2021-04-01 DIAGNOSIS — E1165 Type 2 diabetes mellitus with hyperglycemia: Secondary | ICD-10-CM | POA: Diagnosis not present

## 2021-04-01 DIAGNOSIS — E119 Type 2 diabetes mellitus without complications: Secondary | ICD-10-CM | POA: Diagnosis not present

## 2021-04-01 DIAGNOSIS — Z87440 Personal history of urinary (tract) infections: Secondary | ICD-10-CM | POA: Diagnosis not present

## 2021-04-01 DIAGNOSIS — I7300 Raynaud's syndrome without gangrene: Secondary | ICD-10-CM | POA: Diagnosis not present

## 2021-04-01 DIAGNOSIS — R531 Weakness: Secondary | ICD-10-CM | POA: Insufficient documentation

## 2021-04-01 DIAGNOSIS — C7951 Secondary malignant neoplasm of bone: Secondary | ICD-10-CM

## 2021-04-01 DIAGNOSIS — C61 Malignant neoplasm of prostate: Secondary | ICD-10-CM | POA: Diagnosis not present

## 2021-04-01 DIAGNOSIS — K219 Gastro-esophageal reflux disease without esophagitis: Secondary | ICD-10-CM | POA: Insufficient documentation

## 2021-04-01 DIAGNOSIS — I700 Atherosclerosis of aorta: Secondary | ICD-10-CM | POA: Insufficient documentation

## 2021-04-01 DIAGNOSIS — Z7984 Long term (current) use of oral hypoglycemic drugs: Secondary | ICD-10-CM | POA: Diagnosis not present

## 2021-04-01 DIAGNOSIS — Z923 Personal history of irradiation: Secondary | ICD-10-CM | POA: Diagnosis not present

## 2021-04-01 DIAGNOSIS — D509 Iron deficiency anemia, unspecified: Secondary | ICD-10-CM | POA: Insufficient documentation

## 2021-04-01 DIAGNOSIS — Z510 Encounter for antineoplastic radiation therapy: Secondary | ICD-10-CM | POA: Insufficient documentation

## 2021-04-01 DIAGNOSIS — R5383 Other fatigue: Secondary | ICD-10-CM | POA: Diagnosis not present

## 2021-04-01 DIAGNOSIS — I2510 Atherosclerotic heart disease of native coronary artery without angina pectoris: Secondary | ICD-10-CM | POA: Insufficient documentation

## 2021-04-01 LAB — COMPREHENSIVE METABOLIC PANEL
ALT: 11 U/L (ref 0–44)
AST: 21 U/L (ref 15–41)
Albumin: 3.9 g/dL (ref 3.5–5.0)
Alkaline Phosphatase: 66 U/L (ref 38–126)
Anion gap: 8 (ref 5–15)
BUN: 19 mg/dL (ref 8–23)
CO2: 26 mmol/L (ref 22–32)
Calcium: 9.1 mg/dL (ref 8.9–10.3)
Chloride: 95 mmol/L — ABNORMAL LOW (ref 98–111)
Creatinine, Ser: 1.14 mg/dL (ref 0.61–1.24)
GFR, Estimated: >60 mL/min (ref >60)
Glucose, Bld: 201 mg/dL — ABNORMAL HIGH (ref 70–99)
Potassium: 4.8 mmol/L (ref 3.5–5.1)
Sodium: 129 mmol/L — ABNORMAL LOW (ref 135–145)
Total Bilirubin: 0.6 mg/dL (ref 0.3–1.2)
Total Protein: 8 g/dL (ref 6.5–8.1)

## 2021-04-01 LAB — CBC WITH DIFFERENTIAL/PLATELET
Abs Immature Granulocytes: 0.04 10*3/uL (ref 0.00–0.07)
Basophils Absolute: 0 10*3/uL (ref 0.0–0.1)
Basophils Relative: 0 %
Eosinophils Absolute: 0 10*3/uL (ref 0.0–0.5)
Eosinophils Relative: 1 %
HCT: 34 % — ABNORMAL LOW (ref 39.0–52.0)
Hemoglobin: 11.4 g/dL — ABNORMAL LOW (ref 13.0–17.0)
Immature Granulocytes: 1 %
Lymphocytes Relative: 18 %
Lymphs Abs: 1.2 10*3/uL (ref 0.7–4.0)
MCH: 31.1 pg (ref 26.0–34.0)
MCHC: 33.5 g/dL (ref 30.0–36.0)
MCV: 92.6 fL (ref 80.0–100.0)
Monocytes Absolute: 0.6 10*3/uL (ref 0.1–1.0)
Monocytes Relative: 10 %
Neutro Abs: 4.5 10*3/uL (ref 1.7–7.7)
Neutrophils Relative %: 70 %
Platelets: 255 10*3/uL (ref 150–400)
RBC: 3.67 MIL/uL — ABNORMAL LOW (ref 4.22–5.81)
RDW: 12.4 % (ref 11.5–15.5)
WBC: 6.4 10*3/uL (ref 4.0–10.5)
nRBC: 0 % (ref 0.0–0.2)

## 2021-04-01 LAB — PSA: Prostatic Specific Antigen: 0.33 ng/mL (ref 0.00–4.00)

## 2021-04-01 MED ORDER — LEUPROLIDE ACETATE (6 MONTH) 45 MG ~~LOC~~ KIT
45.0000 mg | PACK | Freq: Once | SUBCUTANEOUS | Status: AC
  Administered 2021-04-01: 45 mg via SUBCUTANEOUS
  Filled 2021-04-01: qty 45

## 2021-04-01 NOTE — Consult Note (Signed)
NEW PATIENT EVALUATION  Name: Michael [name]  MRN: [id number]  Date:   04/01/2021     DOB: 10/03/1933   This 88 y.o. male patient presents to the clinic for initial evaluation of metastatic prostate cancer with increasing left hip pain.  REFERRING PHYSICIAN: [name], MD  CHIEF COMPLAINT: No chief complaint on file.   DIAGNOSIS: The encounter diagnosis was Bone metastases ([place]).   PREVIOUS INVESTIGATIONS:  Bone scan reviewed Clinical notes reviewed Labs reviewed  HPI: Patient is a 88 year old male with stage IV metastatic prostate cancer with invasion into the bladder and rectum.  He is currently on tramadol for bone metastasis with increasing pain in his left hip.  Bone scan shows abnormal uptake near the left SI joint as well as left acetabulum consistent with metastatic disease.  He also has multiple areas of the thoracic spine left rib.  Patient is ambulating well is currently on meloxicam as well as tramadol.  He is seen today for consideration of palliative radiation therapy.  PLANNED TREATMENT REGIMEN: External beam treatment to his left SI joint as well as left acetabulum  PAST MEDICAL HISTORY:  has a past medical history of Anemia, Aortic atherosclerosis ([place]), Bilateral carotid artery disease ([place]), Diabetes mellitus without complication ([place]), Essential hypertension, GERD (gastroesophageal reflux disease), Hyperlipidemia, Hypertension, Left carotid artery stenosis, Nonobstructive Coronary artery disease, Prostate cancer ([place]) (03/2017), and Raynaud disease.    PAST SURGICAL HISTORY:  Past Surgical History:  Procedure Laterality Date   CARDIAC CATHETERIZATION  2010   CARDIAC CATHETERIZATION N/A 02/03/2015   Procedure: Left Heart Cath and Coronary Angiography;  Surgeon: Wellington [name], MD;  Location: [place] [facility];  Service: Cardiovascular;  Laterality: N/A;   CATARACT EXTRACTION Bilateral    CYSTOSCOPY W/ RETROGRADES Bilateral 04/11/2017   Procedure:  CYSTOSCOPY WITH RETROGRADE PYELOGRAM;  Surgeon: [name], MD;  Location: [facility];  Service: Urology;  Laterality: Bilateral;   CYSTOSCOPY W/ RETROGRADES Bilateral 09/12/2017   Procedure: CYSTOSCOPY WITH RETROGRADE PYELOGRAM;  Surgeon: [name], MD;  Location: [facility];  Service: Urology;  Laterality: Bilateral;   CYSTOSCOPY W/ RETROGRADES Bilateral 02/25/2018   Procedure: CYSTOSCOPY WITH RETROGRADE PYELOGRAM;  Surgeon: [name], MD;  Location: [facility];  Service: Urology;  Laterality: Bilateral;   CYSTOSCOPY W/ URETERAL STENT PLACEMENT Bilateral 09/12/2017   Procedure: CYSTOSCOPY WITH STENT REPLACEMENT;  Surgeon: [name], MD;  Location: [facility];  Service: Urology;  Laterality: Bilateral;   CYSTOSCOPY W/ URETERAL STENT REMOVAL Bilateral 02/25/2018   Procedure: CYSTOSCOPY WITH STENT REMOVAL;  Surgeon: [name], MD;  Location: [facility];  Service: Urology;  Laterality: Bilateral;   CYSTOSCOPY WITH STENT PLACEMENT Left 04/11/2017   Procedure: CYSTOSCOPY WITH STENT PLACEMENT and fulgeration;  Surgeon: [name], MD;  Location: [facility];  Service: Urology;  Laterality: Left;   CYSTOSCOPY WITH URETHRAL DILATATION Bilateral 04/11/2017   Procedure: CYSTOSCOPY WITH URETHRAL DILATATION;  Surgeon: [name], MD;  Location: [facility];  Service: Urology;  Laterality: Bilateral;   IR CONVERT RIGHT NEPHROSTOMY TO NEPHROURETERAL CATH  05/21/2017   IR NEPHROSTOMY PLACEMENT RIGHT  04/13/2017    FAMILY HISTORY: family history includes Hypertension in his father.  SOCIAL HISTORY:  reports that he has never smoked. He has never used smokeless tobacco. He reports that he does not drink alcohol and does not use drugs.  ALLERGIES: Patient has no known allergies.  MEDICATIONS:  Current Outpatient Medications  Medication Sig Dispense Refill   acetaminophen (TYLENOL) 500 MG tablet Take 500 mg by mouth  every 4 (four) hours as needed for moderate pain or fever.     albuterol  (PROVENTIL HFA;VENTOLIN HFA) 108 (90 Base) MCG/ACT inhaler Inhale 2 puffs into the lungs every 4 (four) hours as needed for wheezing or shortness of breath.     atorvastatin (LIPITOR) 10 MG tablet Take 10 mg by mouth daily at 6 PM.      Calcium Carb-Cholecalciferol (CALCIUM 600+D3) 600-800 MG-UNIT TABS Take 1 tablet by mouth every evening.      Calcium Carbonate-Vitamin D 600-200 MG-UNIT TABS Take by mouth. (Patient not taking: Reported on 04/01/2021)     COVID-19 mRNA Vac-TriS, Pfizer, (PFIZER-BIONT COVID-19 VAC-TRIS) SUSP injection Inject into the muscle. (Patient not taking: Reported on 04/01/2021) 0.3 mL 0   enzalutamide (XTANDI) 40 MG tablet Take 2 tablets (80 mg total) by mouth daily. 60 tablet 3   glimepiride (AMARYL) 2 MG tablet Take 2 mg by mouth daily with breakfast.     glucose blood (ACCU-CHEK GUIDE) test strip Use asdirected three times a day diag E11.65 100 each 3   ibuprofen (ADVIL,MOTRIN) 200 MG tablet Take 200 mg by mouth every 6 (six) hours as needed for fever or moderate pain.      iron polysaccharides (NIFEREX) 150 MG capsule Take 150 mg by mouth daily.      lisinopril (ZESTRIL) 2.5 MG tablet Take 2.5 mg by mouth daily.     metFORMIN (GLUCOPHAGE) 1000 MG tablet Take 1,000 mg by mouth 2 (two) times daily.     metoprolol succinate (TOPROL-XL) 25 MG 24 hr tablet TAKE 1 TABLET BY MOUTH EVERY DAY FOR BLOOD PRESSURE 90 tablet 3   mirabegron ER (MYRBETRIQ) 25 MG TB24 tablet Take 1 tablet (25 mg total) by mouth daily. 90 tablet 3   Multiple Vitamins-Minerals (MULTIVITAMIN WITH MINERALS) tablet Take 1 tablet by mouth daily.      NIFEdipine (ADALAT CC) 30 MG 24 hr tablet TAKE 1 TABLET BY MOUTH EVERY DAY 90 tablet 2   omeprazole (PRILOSEC) 20 MG capsule TAKE ONE CAPSULE BY MOUTH EVERY DAY 90 capsule 2   omeprazole (PRILOSEC) 20 MG capsule Take 1 tablet by mouth daily.     traMADol (ULTRAM) 50 MG tablet Take 0.5-1 tablets (25-50 mg total) by mouth every 12 (twelve) hours as needed. 30 tablet  0   No current facility-administered medications for this encounter.    ECOG PERFORMANCE STATUS:  1 - Symptomatic but completely ambulatory  REVIEW OF SYSTEMS: Patient denies any weight loss, fatigue, weakness, fever, chills or night sweats. Patient denies any loss of vision, blurred vision. Patient denies any ringing  of the ears or hearing loss. No irregular heartbeat. Patient denies heart murmur or history of fainting. Patient denies any chest pain or pain radiating to her upper extremities. Patient denies any shortness of breath, difficulty breathing at night, cough or hemoptysis. Patient denies any swelling in the lower legs. Patient denies any nausea vomiting, vomiting of blood, or coffee ground material in the vomitus. Patient denies any stomach pain. Patient states has had normal bowel movements no significant constipation or diarrhea. Patient denies any dysuria, hematuria or significant nocturia. Patient denies any problems walking, swelling in the joints or loss of balance. Patient denies any skin changes, loss of hair or loss of weight. Patient denies any excessive worrying or anxiety or significant depression. Patient denies any problems with insomnia. Patient denies excessive thirst, polyuria, polydipsia. Patient denies any swollen glands, patient denies easy bruising or easy bleeding. Patient denies any recent infections,  allergies or URI. Patient "s visual fields have not changed significantly in recent time.   PHYSICAL EXAM: There were no vitals taken for this visit. Range of motion of his lower extremities does not elicit pain deep palpation of the spine does not elicit pain.  Motor and sensory levels are equal and symmetric in lower extremities.  Well-developed well-nourished patient in NAD. HEENT reveals PERLA, EOMI, discs not visualized.  Oral cavity is clear. No oral mucosal lesions are identified. Neck is clear without evidence of cervical or supraclavicular adenopathy. Lungs are  clear to A&P. Cardiac examination is essentially unremarkable with regular rate and rhythm without murmur rub or thrill. Abdomen is benign with no organomegaly or masses noted. Motor sensory and DTR levels are equal and symmetric in the upper and lower extremities. Cranial nerves II through XII are grossly intact. Proprioception is intact. No peripheral adenopathy or edema is identified. No motor or sensory levels are noted. Crude visual fields are within normal range.  LABORATORY DATA: Labs reviewed    RADIOLOGY RESULTS: Bone scan and CT scan of the chest reviewed   IMPRESSION: Stage IV metastatic prostate cancer with involvement of left SI joint and left acetabulum causing narcotic dependent pain in 88 year old male  PLAN: At this time elected ahead with palliative radiation therapy I will include both areas of involvement including the left SI joint as well as left acetabulum.  We will plan on delivering 30 Gray in 10 fractions.  Risks and benefits of treatment occluding skin reaction fatigue possible diarrhea all were discussed in detail with the patient and her daughter.  They both seem to comprehend treatment plan well.  I personally set up and ordered CT simulation.  I would like to take this opportunity to thank you for allowing me to participate in the care of your patient.Noreene Filbert, MD

## 2021-04-01 NOTE — Progress Notes (Signed)
[facility] OFFICE PROGRESS NOTE  Patient Care Team: [name], MD as PCP - General (Internal Medicine) Wellington [name], MD as PCP - Cardiology (Cardiology)   Cancer Staging  No matching staging information was found for the patient.   Oncology History Overview Note  # FEB 2019-Metastatic prostate cancer/ castrate sensitive [bulky retroperitoneal adenopathy; invasion into the bladder; rectum] Bladder Bx- prostate. On Degarelix  # April 3rd 2019- [name] [165md];  Lupron q 6 M [[id number]]; MID AUG 2019- reduced dose to 834mday  # Poorly controlled DM-on insulin  # s/p right PCN [explanted]/ Foley cath [Dr.Brandon]; multiple UTIs. [s/p ID; Dr.Ravishankar]  ----------------------------------------------------------------- DIAGNOSIS: [ FEB 2019] MET. PROSTATE CA  STAGE:   IV ;GOALS: PALLIATIVE  CURRENT/MOST RECENT THERAPY [ Feb-April2019] Lupron+ [name]    [place]     INTERVAL HISTORY: Ambulating independently.  Accompanied by his daughter.  GoJARMAN LITTON89.o.  male pleasant patient [name]/ELigard is here for follow-up/review results of the bone scan.  In the interim patient was evaluated by symptom management clinic for worsening left-sided hip pain.  Bone scan was ordered.  Patient is currently taking tramadol maybe once a day.  He is taking NSAIDs at nighttime.   Appetite is good.  No weight loss.  No new bladder infections.  Review of Systems  Constitutional:  Positive for malaise/fatigue. Negative for chills, diaphoresis, fever and weight loss.  HENT:  Negative for nosebleeds and sore throat.   Eyes:  Negative for double vision.  Respiratory:  Negative for cough, hemoptysis, sputum production and wheezing.   Cardiovascular:  Negative for chest pain, palpitations, orthopnea and leg swelling.  Gastrointestinal:  Negative for abdominal pain,  blood in stool, constipation, diarrhea, heartburn, melena, nausea and vomiting.  Musculoskeletal:  Positive for back pain and joint pain.  Skin: Negative.  Negative for itching and rash.  Neurological:  Negative for dizziness, tingling, focal weakness, weakness and headaches.  Endo/Heme/Allergies:  Does not bruise/bleed easily.  Psychiatric/Behavioral:  Negative for depression. The patient is not nervous/anxious and does not have insomnia.      PAST MEDICAL HISTORY :  Past Medical History:  Diagnosis Date   Anemia    iron deficiency   Aortic atherosclerosis (HCC)    Bilateral carotid artery disease (HCC)    Mild plaque formation without obstructive disease noted on carotid Doppler.   Diabetes mellitus without complication ([place]   Essential hypertension    GERD (gastroesophageal reflux disease)    Hyperlipidemia    Hypertension    Left carotid artery stenosis    Nonobstructive Coronary artery disease    a. [facility] The patient was told about 2 blockages which did not require revascularization; b. 01/2015 Cath: LM nl, LAD 10p/m, 45109m D2 30ost, LCX nl, RCA min irregs, EF 55-65%; b. 12/2018 MV: No ischemia/infarct. CT images w/ mod 3 vessel Cor Ca2+ and mild-mod Ao atherosclerosis.   [place]state cancer ([facility]   Raynaud disease    a. Managed w/ nifedipine.    PAST SURGICAL HISTORY :   Past Surgical History:  Procedure Laterality Date   CARDIAC CATHETERIZATION  2010   CARDIAC CATHETERIZATION N/A 02/03/2015   Procedure: Left Heart Cath and Coronary Angiography;  Surgeon: MuhWellington HampshireD;  Location: [facility];  Service: Cardiovascular;  Laterality: N/A;   CATARACT EXTRACTION Bilateral    CYSTOSCOPY W/ RETROGRADES Bilateral 04/11/2017  Procedure: CYSTOSCOPY WITH RETROGRADE PYELOGRAM;  Surgeon: [name], MD;  Location: [facility];  Service: Urology;  Laterality: Bilateral;   CYSTOSCOPY W/ RETROGRADES  Bilateral 09/12/2017   Procedure: CYSTOSCOPY WITH RETROGRADE PYELOGRAM;  Surgeon: [name], MD;  Location: [facility];  Service: Urology;  Laterality: Bilateral;   CYSTOSCOPY W/ RETROGRADES Bilateral 02/25/2018   Procedure: CYSTOSCOPY WITH RETROGRADE PYELOGRAM;  Surgeon: [name], MD;  Location: [facility];  Service: Urology;  Laterality: Bilateral;   CYSTOSCOPY W/ URETERAL STENT PLACEMENT Bilateral 09/12/2017   Procedure: CYSTOSCOPY WITH STENT REPLACEMENT;  Surgeon: [name], MD;  Location: [facility];  Service: Urology;  Laterality: Bilateral;   CYSTOSCOPY W/ URETERAL STENT REMOVAL Bilateral 02/25/2018   Procedure: CYSTOSCOPY WITH STENT REMOVAL;  Surgeon: [name], MD;  Location: [facility];  Service: Urology;  Laterality: Bilateral;   CYSTOSCOPY WITH STENT PLACEMENT Left 04/11/2017   Procedure: CYSTOSCOPY WITH STENT PLACEMENT and fulgeration;  Surgeon: [name], MD;  Location: [facility];  Service: Urology;  Laterality: Left;   CYSTOSCOPY WITH URETHRAL DILATATION Bilateral 04/11/2017   Procedure: CYSTOSCOPY WITH URETHRAL DILATATION;  Surgeon: [name], MD;  Location: [facility];  Service: Urology;  Laterality: Bilateral;   IR CONVERT RIGHT NEPHROSTOMY TO NEPHROURETERAL CATH  05/21/2017   IR NEPHROSTOMY PLACEMENT RIGHT  04/13/2017    FAMILY HISTORY :   Family History  Problem Relation Age of Onset   Hypertension Father     SOCIAL HISTORY:   Social History   Tobacco Use   Smoking status: Never   Smokeless tobacco: Never  Vaping Use   Vaping Use: Never used  Substance Use Topics   Alcohol use: No   Drug use: No    ALLERGIES:  has No Known Allergies.  MEDICATIONS:  Current Outpatient Medications  Medication Sig Dispense Refill   acetaminophen (TYLENOL) 500 MG tablet Take 500 mg by mouth every 4 (four) hours as needed for moderate pain or fever.     albuterol (PROVENTIL HFA;VENTOLIN HFA) 108 (90 Base) MCG/ACT inhaler Inhale 2 puffs into the  lungs every 4 (four) hours as needed for wheezing or shortness of breath.     atorvastatin (LIPITOR) 10 MG tablet Take 10 mg by mouth daily at 6 PM.      Calcium Carb-Cholecalciferol (CALCIUM 600+D3) 600-800 MG-UNIT TABS Take 1 tablet by mouth every evening.      enzalutamide (XTANDI) 40 MG tablet Take 2 tablets (80 mg total) by mouth daily. 60 tablet 3   glimepiride (AMARYL) 2 MG tablet Take 2 mg by mouth daily with breakfast.     glucose blood (ACCU-CHEK GUIDE) test strip Use asdirected three times a day diag E11.65 100 each 3   ibuprofen (ADVIL,MOTRIN) 200 MG tablet Take 200 mg by mouth every 6 (six) hours as needed for fever or moderate pain.      iron polysaccharides (NIFEREX) 150 MG capsule Take 150 mg by mouth daily.      lisinopril (ZESTRIL) 2.5 MG tablet Take 2.5 mg by mouth daily.     metFORMIN (GLUCOPHAGE) 1000 MG tablet Take 1,000 mg by mouth 2 (two) times daily.     metoprolol succinate (TOPROL-XL) 25 MG 24 hr tablet TAKE 1 TABLET BY MOUTH EVERY DAY FOR BLOOD PRESSURE 90 tablet 3   mirabegron ER (MYRBETRIQ) 25 MG TB24 tablet Take 1 tablet (25 mg total) by mouth daily. 90 tablet 3   Multiple Vitamins-Minerals (MULTIVITAMIN WITH MINERALS) tablet Take 1 tablet by mouth daily.      NIFEdipine (ADALAT  CC) 30 MG 24 hr tablet TAKE 1 TABLET BY MOUTH EVERY DAY 90 tablet 2   omeprazole (PRILOSEC) 20 MG capsule TAKE ONE CAPSULE BY MOUTH EVERY DAY 90 capsule 2   omeprazole (PRILOSEC) 20 MG capsule Take 1 tablet by mouth daily.     traMADol (ULTRAM) 50 MG tablet Take 0.5-1 tablets (25-50 mg total) by mouth every 12 (twelve) hours as needed. 30 tablet 0   Calcium Carbonate-Vitamin D 600-200 MG-UNIT TABS Take by mouth. (Patient not taking: Reported on 04/01/2021)     COVID-19 mRNA Vac-TriS, Pfizer, (PFIZER-BIONT COVID-19 VAC-TRIS) SUSP injection Inject into the muscle. (Patient not taking: Reported on 04/01/2021) 0.3 mL 0   No current facility-administered medications for this  visit.    PHYSICAL EXAMINATION: ECOG PERFORMANCE STATUS: 1 - Symptomatic but completely ambulatory  BP 127/80    Pulse 74    Temp (!) 96.5 F (35.8 C) (Tympanic)    Resp 16    Wt 130 lb 12.8 oz (59.3 kg)    BMI 21.11 kg/m   Filed Weights   04/01/21 0800  Weight: 130 lb 12.8 oz (59.3 kg)    Physical Exam Constitutional:      Comments: Accompanied by his daughter.  Walking himself.  HENT:     Head: Normocephalic and atraumatic.     Mouth/Throat:     Pharynx: No oropharyngeal exudate.  Eyes:     Pupils: Pupils are equal, round, and reactive to light.  Cardiovascular:     Rate and Rhythm: Normal rate and regular rhythm.  Pulmonary:     Effort: Pulmonary effort is normal. No respiratory distress.     Breath sounds: Normal breath sounds. No wheezing.  Abdominal:     General: Bowel sounds are normal. There is no distension.     Palpations: Abdomen is soft. There is no mass.     Tenderness: There is no abdominal tenderness. There is no guarding or rebound.  Musculoskeletal:        General: No tenderness. Normal range of motion.     Cervical back: Normal range of motion and neck supple.  Skin:    General: Skin is warm.  Neurological:     Mental Status: He is alert and oriented to person, place, and time.  Psychiatric:        Mood and Affect: Affect normal.     LABORATORY DATA:  I have reviewed the data as listed    Component Value Date/Time   NA 129 (L) 04/01/2021 0810   NA 136 05/07/2012 1328   K 4.8 04/01/2021 0810   K 4.5 05/07/2012 1328   CL 95 (L) 04/01/2021 0810   CL 104 05/07/2012 1328   CO2 26 04/01/2021 0810   CO2 28 05/07/2012 1328   GLUCOSE 201 (H) 04/01/2021 0810   GLUCOSE 73 05/07/2012 1328   BUN 19 04/01/2021 0810   BUN 16 05/07/2012 1328   CREATININE 1.14 04/01/2021 0810   CREATININE 0.84 05/07/2012 1328   CALCIUM 9.1 04/01/2021 0810   CALCIUM 8.8 05/07/2012 1328   PROT 8.0 04/01/2021 0810   ALBUMIN 3.9 04/01/2021 0810   AST 21 04/01/2021 0810    ALT 11 04/01/2021 0810   ALKPHOS 66 04/01/2021 0810   BILITOT 0.6 04/01/2021 0810   GFRNONAA >60 04/01/2021 0810   GFRNONAA >60 05/07/2012 1328   GFRAA >60 11/17/2019 0819   GFRAA >60 05/07/2012 1328    No results found for: SPEP, UPEP  Lab Results  Component Value Date  WBC 6.4 04/01/2021   NEUTROABS 4.5 04/01/2021   HGB 11.4 (L) 04/01/2021   HCT 34.0 (L) 04/01/2021   MCV 92.6 04/01/2021   PLT 255 04/01/2021      Chemistry      Component Value Date/Time   NA 129 (L) 04/01/2021 0810   NA 136 05/07/2012 1328   K 4.8 04/01/2021 0810   K 4.5 05/07/2012 1328   CL 95 (L) 04/01/2021 0810   CL 104 05/07/2012 1328   CO2 26 04/01/2021 0810   CO2 28 05/07/2012 1328   BUN 19 04/01/2021 0810   BUN 16 05/07/2012 1328   CREATININE 1.14 04/01/2021 0810   CREATININE 0.84 05/07/2012 1328      Component Value Date/Time   CALCIUM 9.1 04/01/2021 0810   CALCIUM 8.8 05/07/2012 1328   ALKPHOS 66 04/01/2021 0810   AST 21 04/01/2021 0810   ALT 11 04/01/2021 0810   BILITOT 0.6 04/01/2021 0810       RADIOGRAPHIC STUDIES: I have personally reviewed the radiological images as listed and agreed with the findings in the report. No results found.   ASSESSMENT & PLAN:  Prostate cancer [facility]) # Metastatic prostate cancer/[FEB 2019] castrate RESISTANT-based on February, 2023-bone scan that shows progressive disease/new uptake.  Recommend a PET scan for further evaluation-especially the context of rising PSA.  #Patient currently on Xtandi 80 mg a day [secondary to fatigue]; Eligard-continue the same.  Await above PET scan.  Recommend evaluation with Dr. Donella Stade for pain control.  #Discussed with patient and daughter that if PET scan shows progressive disease-the options include systemic chemotherapy [weekly docetaxel]; another novel ADTs [Zytiga/prednisone].  My preference would be docetaxel as the patient's performance status is still ECOG 1-2.  Also discussed regarding Zometa-potential  side effects of including osteonecrosis of the jaw/hypocalcemia.   # Anemia-iron deficiency-hemoglobin ~11-12- STABLE.  on PO iron.   # DM-type II- on metformin-STABLE [Dr.Solum].  BG-201 post BF-need to monitor closely on chemotherapy/steroids.  * eliagard- 04/01/21- # DISPOSITION:  # Eligard today # Follow up in 2 weeks; MD/labs-cbc/cmp; Zometa; Dr.B  # I reviewed the blood work- with the patient in detail; also reviewed the imaging independently [as summarized above]; and with the patient in detail.       Orders Placed This Encounter  Procedures   CBC with Differential/Platelet    Standing Status:   Future    Standing Expiration Date:   04/01/2022   Comprehensive metabolic panel    Standing Status:   Future    Standing Expiration Date:   04/01/2022    All questions were answered. The patient knows to call the clinic with any problems, questions or concerns.      [name], MD 04/01/2021 4:55 PM

## 2021-04-01 NOTE — Progress Notes (Signed)
Patient has increase joint pain and the Tramadol does help but the pain returns.  Pain is at 9/10 in hip as well as back and did take a Tramadol this morning.

## 2021-04-01 NOTE — Assessment & Plan Note (Addendum)
#   Metastatic prostate cancer/[FEB 2019] castrate RESISTANT-based on February, 2023-bone scan that shows progressive disease/new uptake.  Recommend a PET scan for further evaluation-especially the context of rising PSA.  #Patient currently on Xtandi 80 mg a day [secondary to fatigue]; Eligard-continue the same.  Await above PET scan.  Recommend evaluation with Dr. Donella Stade for pain control.  #Discussed with patient and daughter that if PET scan shows progressive disease-the options include systemic chemotherapy [weekly docetaxel]; another novel ADTs [Zytiga/prednisone].  My preference would be docetaxel as the patient's performance status is still ECOG 1-2.  Also discussed regarding Zometa-potential side effects of including osteonecrosis of the jaw/hypocalcemia.   # Anemia-iron deficiency-hemoglobin ~11-12- STABLE.  on PO iron.   # DM-type II- on metformin-STABLE [Dr.Solum].  BG-201 post BF-need to monitor closely on chemotherapy/steroids.  * eliagard- 04/01/21- # DISPOSITION:  # Eligard today # Follow up in 2 weeks; MD/labs-cbc/cmp; Zometa; Dr.B  # I reviewed the blood work- with the patient in detail; also reviewed the imaging independently [as summarized above]; and with the patient in detail.

## 2021-04-03 ENCOUNTER — Other Ambulatory Visit (INDEPENDENT_AMBULATORY_CARE_PROVIDER_SITE_OTHER): Payer: Self-pay | Admitting: Vascular Surgery

## 2021-04-05 ENCOUNTER — Encounter
Admission: RE | Admit: 2021-04-05 | Discharge: 2021-04-05 | Disposition: A | Discharge status: Home / Self Care | Payer: Medicare HMO | Source: Ambulatory Visit | Attending: Internal Medicine | Admitting: Internal Medicine

## 2021-04-05 DIAGNOSIS — C7951 Secondary malignant neoplasm of bone: Secondary | ICD-10-CM | POA: Diagnosis not present

## 2021-04-05 DIAGNOSIS — M25552 Pain in left hip: Secondary | ICD-10-CM | POA: Diagnosis not present

## 2021-04-05 DIAGNOSIS — C61 Malignant neoplasm of prostate: Secondary | ICD-10-CM | POA: Diagnosis not present

## 2021-04-05 DIAGNOSIS — R590 Localized enlarged lymph nodes: Secondary | ICD-10-CM | POA: Diagnosis not present

## 2021-04-05 MED ORDER — PIFLIFOLASTAT F 18 (PYLARIFY) INJECTION
9.0000 | Freq: Once | INTRAVENOUS | Status: AC
  Administered 2021-04-05: 9.75 via INTRAVENOUS

## 2021-04-06 ENCOUNTER — Ambulatory Visit
Admission: RE | Admit: 2021-04-06 | Discharge: 2021-04-06 | Disposition: A | Discharge status: Home / Self Care | Payer: Medicare HMO | Source: Ambulatory Visit | Attending: Radiation Oncology | Admitting: Radiation Oncology

## 2021-04-06 DIAGNOSIS — E1165 Type 2 diabetes mellitus with hyperglycemia: Secondary | ICD-10-CM | POA: Diagnosis not present

## 2021-04-06 DIAGNOSIS — Z510 Encounter for antineoplastic radiation therapy: Secondary | ICD-10-CM | POA: Insufficient documentation

## 2021-04-06 DIAGNOSIS — I700 Atherosclerosis of aorta: Secondary | ICD-10-CM | POA: Diagnosis not present

## 2021-04-06 DIAGNOSIS — C7951 Secondary malignant neoplasm of bone: Secondary | ICD-10-CM | POA: Insufficient documentation

## 2021-04-06 DIAGNOSIS — Z79899 Other long term (current) drug therapy: Secondary | ICD-10-CM | POA: Diagnosis not present

## 2021-04-06 DIAGNOSIS — C61 Malignant neoplasm of prostate: Secondary | ICD-10-CM | POA: Insufficient documentation

## 2021-04-06 DIAGNOSIS — D509 Iron deficiency anemia, unspecified: Secondary | ICD-10-CM | POA: Diagnosis not present

## 2021-04-06 DIAGNOSIS — Z87440 Personal history of urinary (tract) infections: Secondary | ICD-10-CM | POA: Diagnosis not present

## 2021-04-06 DIAGNOSIS — Z79818 Long term (current) use of other agents affecting estrogen receptors and estrogen levels: Secondary | ICD-10-CM | POA: Diagnosis not present

## 2021-04-07 ENCOUNTER — Other Ambulatory Visit: Payer: Self-pay | Admitting: Hospice and Palliative Medicine

## 2021-04-07 ENCOUNTER — Encounter: Payer: Self-pay | Admitting: Internal Medicine

## 2021-04-07 DIAGNOSIS — D509 Iron deficiency anemia, unspecified: Secondary | ICD-10-CM | POA: Diagnosis not present

## 2021-04-07 DIAGNOSIS — C7951 Secondary malignant neoplasm of bone: Secondary | ICD-10-CM | POA: Diagnosis not present

## 2021-04-07 DIAGNOSIS — Z510 Encounter for antineoplastic radiation therapy: Secondary | ICD-10-CM | POA: Diagnosis not present

## 2021-04-07 DIAGNOSIS — C61 Malignant neoplasm of prostate: Secondary | ICD-10-CM | POA: Diagnosis not present

## 2021-04-07 DIAGNOSIS — Z79899 Other long term (current) drug therapy: Secondary | ICD-10-CM | POA: Diagnosis not present

## 2021-04-07 DIAGNOSIS — I700 Atherosclerosis of aorta: Secondary | ICD-10-CM | POA: Diagnosis not present

## 2021-04-07 DIAGNOSIS — Z87440 Personal history of urinary (tract) infections: Secondary | ICD-10-CM | POA: Diagnosis not present

## 2021-04-07 DIAGNOSIS — E1165 Type 2 diabetes mellitus with hyperglycemia: Secondary | ICD-10-CM | POA: Diagnosis not present

## 2021-04-07 DIAGNOSIS — Z79818 Long term (current) use of other agents affecting estrogen receptors and estrogen levels: Secondary | ICD-10-CM | POA: Diagnosis not present

## 2021-04-07 MED ORDER — TRAMADOL HCL 50 MG PO TABS: 50.0000 mg | ORAL_TABLET | Freq: Four times a day (QID) | ORAL | 0 refills | Status: DC | PRN

## 2021-04-07 NOTE — Progress Notes (Signed)
I spoke to patients daughter regarding the results of the pet scan. Continue current plan of radiation/cancer pills . with regards to worsening pain , Recommend taking tramadol 3 to 4 times a day as needed. Add Tylenol 1 to 2 pills three times a day as needed. Avoid Motrin as much as possible.  Patient will follow up as planned next week. for now hold off chemotherapy plans given the absence of progression of disease at multiple  sites.

## 2021-04-11 ENCOUNTER — Ambulatory Visit: Admission: RE | Admit: 2021-04-11 | Payer: Medicare HMO | Source: Ambulatory Visit

## 2021-04-11 DIAGNOSIS — C61 Malignant neoplasm of prostate: Secondary | ICD-10-CM | POA: Diagnosis not present

## 2021-04-11 DIAGNOSIS — C7951 Secondary malignant neoplasm of bone: Secondary | ICD-10-CM | POA: Diagnosis not present

## 2021-04-11 DIAGNOSIS — Z87440 Personal history of urinary (tract) infections: Secondary | ICD-10-CM | POA: Diagnosis not present

## 2021-04-11 DIAGNOSIS — Z79899 Other long term (current) drug therapy: Secondary | ICD-10-CM | POA: Diagnosis not present

## 2021-04-11 DIAGNOSIS — Z510 Encounter for antineoplastic radiation therapy: Secondary | ICD-10-CM | POA: Diagnosis not present

## 2021-04-11 DIAGNOSIS — E1165 Type 2 diabetes mellitus with hyperglycemia: Secondary | ICD-10-CM | POA: Diagnosis not present

## 2021-04-11 DIAGNOSIS — D509 Iron deficiency anemia, unspecified: Secondary | ICD-10-CM | POA: Diagnosis not present

## 2021-04-11 DIAGNOSIS — I700 Atherosclerosis of aorta: Secondary | ICD-10-CM | POA: Diagnosis not present

## 2021-04-11 DIAGNOSIS — Z79818 Long term (current) use of other agents affecting estrogen receptors and estrogen levels: Secondary | ICD-10-CM | POA: Diagnosis not present

## 2021-04-12 ENCOUNTER — Ambulatory Visit
Admission: RE | Admit: 2021-04-12 | Discharge: 2021-04-12 | Disposition: A | Discharge status: Home / Self Care | Payer: Medicare HMO | Source: Ambulatory Visit | Attending: Radiation Oncology | Admitting: Radiation Oncology

## 2021-04-12 DIAGNOSIS — Z79899 Other long term (current) drug therapy: Secondary | ICD-10-CM | POA: Diagnosis not present

## 2021-04-12 DIAGNOSIS — E1165 Type 2 diabetes mellitus with hyperglycemia: Secondary | ICD-10-CM | POA: Diagnosis not present

## 2021-04-12 DIAGNOSIS — D509 Iron deficiency anemia, unspecified: Secondary | ICD-10-CM | POA: Diagnosis not present

## 2021-04-12 DIAGNOSIS — Z87440 Personal history of urinary (tract) infections: Secondary | ICD-10-CM | POA: Diagnosis not present

## 2021-04-12 DIAGNOSIS — Z79818 Long term (current) use of other agents affecting estrogen receptors and estrogen levels: Secondary | ICD-10-CM | POA: Diagnosis not present

## 2021-04-12 DIAGNOSIS — C7951 Secondary malignant neoplasm of bone: Secondary | ICD-10-CM | POA: Diagnosis not present

## 2021-04-12 DIAGNOSIS — I700 Atherosclerosis of aorta: Secondary | ICD-10-CM | POA: Diagnosis not present

## 2021-04-12 DIAGNOSIS — C61 Malignant neoplasm of prostate: Secondary | ICD-10-CM | POA: Diagnosis not present

## 2021-04-12 DIAGNOSIS — Z510 Encounter for antineoplastic radiation therapy: Secondary | ICD-10-CM | POA: Diagnosis not present

## 2021-04-13 ENCOUNTER — Ambulatory Visit
Admission: RE | Admit: 2021-04-13 | Discharge: 2021-04-13 | Disposition: A | Discharge status: Home / Self Care | Payer: Medicare HMO | Source: Ambulatory Visit | Attending: Radiation Oncology | Admitting: Radiation Oncology

## 2021-04-13 DIAGNOSIS — E1165 Type 2 diabetes mellitus with hyperglycemia: Secondary | ICD-10-CM | POA: Diagnosis not present

## 2021-04-13 DIAGNOSIS — Z87440 Personal history of urinary (tract) infections: Secondary | ICD-10-CM | POA: Diagnosis not present

## 2021-04-13 DIAGNOSIS — C7951 Secondary malignant neoplasm of bone: Secondary | ICD-10-CM | POA: Diagnosis not present

## 2021-04-13 DIAGNOSIS — C61 Malignant neoplasm of prostate: Secondary | ICD-10-CM | POA: Diagnosis not present

## 2021-04-13 DIAGNOSIS — I700 Atherosclerosis of aorta: Secondary | ICD-10-CM | POA: Diagnosis not present

## 2021-04-13 DIAGNOSIS — D509 Iron deficiency anemia, unspecified: Secondary | ICD-10-CM | POA: Diagnosis not present

## 2021-04-13 DIAGNOSIS — Z79899 Other long term (current) drug therapy: Secondary | ICD-10-CM | POA: Diagnosis not present

## 2021-04-13 DIAGNOSIS — Z510 Encounter for antineoplastic radiation therapy: Secondary | ICD-10-CM | POA: Diagnosis not present

## 2021-04-13 DIAGNOSIS — Z79818 Long term (current) use of other agents affecting estrogen receptors and estrogen levels: Secondary | ICD-10-CM | POA: Diagnosis not present

## 2021-04-14 ENCOUNTER — Ambulatory Visit
Admission: RE | Admit: 2021-04-14 | Discharge: 2021-04-14 | Disposition: A | Discharge status: Home / Self Care | Payer: Medicare HMO | Source: Ambulatory Visit | Attending: Radiation Oncology | Admitting: Radiation Oncology

## 2021-04-14 DIAGNOSIS — Z79818 Long term (current) use of other agents affecting estrogen receptors and estrogen levels: Secondary | ICD-10-CM | POA: Diagnosis not present

## 2021-04-14 DIAGNOSIS — I700 Atherosclerosis of aorta: Secondary | ICD-10-CM | POA: Diagnosis not present

## 2021-04-14 DIAGNOSIS — Z79899 Other long term (current) drug therapy: Secondary | ICD-10-CM | POA: Diagnosis not present

## 2021-04-14 DIAGNOSIS — Z87440 Personal history of urinary (tract) infections: Secondary | ICD-10-CM | POA: Diagnosis not present

## 2021-04-14 DIAGNOSIS — C7951 Secondary malignant neoplasm of bone: Secondary | ICD-10-CM | POA: Diagnosis not present

## 2021-04-14 DIAGNOSIS — Z510 Encounter for antineoplastic radiation therapy: Secondary | ICD-10-CM | POA: Diagnosis not present

## 2021-04-14 DIAGNOSIS — D509 Iron deficiency anemia, unspecified: Secondary | ICD-10-CM | POA: Diagnosis not present

## 2021-04-14 DIAGNOSIS — C61 Malignant neoplasm of prostate: Secondary | ICD-10-CM | POA: Diagnosis not present

## 2021-04-14 DIAGNOSIS — E1165 Type 2 diabetes mellitus with hyperglycemia: Secondary | ICD-10-CM | POA: Diagnosis not present

## 2021-04-15 ENCOUNTER — Inpatient Hospital Stay: Payer: Medicare HMO

## 2021-04-15 ENCOUNTER — Inpatient Hospital Stay (HOSPITAL_BASED_OUTPATIENT_CLINIC_OR_DEPARTMENT_OTHER): Payer: Medicare HMO | Admitting: Internal Medicine

## 2021-04-15 ENCOUNTER — Ambulatory Visit
Admission: RE | Admit: 2021-04-15 | Discharge: 2021-04-15 | Disposition: A | Discharge status: Home / Self Care | Payer: Medicare HMO | Source: Ambulatory Visit | Attending: Radiation Oncology | Admitting: Radiation Oncology

## 2021-04-15 ENCOUNTER — Encounter: Payer: Self-pay | Admitting: Internal Medicine

## 2021-04-15 ENCOUNTER — Other Ambulatory Visit: Payer: Self-pay

## 2021-04-15 DIAGNOSIS — Z79899 Other long term (current) drug therapy: Secondary | ICD-10-CM | POA: Diagnosis not present

## 2021-04-15 DIAGNOSIS — C7951 Secondary malignant neoplasm of bone: Secondary | ICD-10-CM | POA: Diagnosis not present

## 2021-04-15 DIAGNOSIS — C61 Malignant neoplasm of prostate: Secondary | ICD-10-CM

## 2021-04-15 DIAGNOSIS — Z79818 Long term (current) use of other agents affecting estrogen receptors and estrogen levels: Secondary | ICD-10-CM | POA: Diagnosis not present

## 2021-04-15 DIAGNOSIS — Z510 Encounter for antineoplastic radiation therapy: Secondary | ICD-10-CM | POA: Diagnosis not present

## 2021-04-15 DIAGNOSIS — I700 Atherosclerosis of aorta: Secondary | ICD-10-CM | POA: Diagnosis not present

## 2021-04-15 DIAGNOSIS — D509 Iron deficiency anemia, unspecified: Secondary | ICD-10-CM | POA: Diagnosis not present

## 2021-04-15 DIAGNOSIS — E1165 Type 2 diabetes mellitus with hyperglycemia: Secondary | ICD-10-CM | POA: Diagnosis not present

## 2021-04-15 DIAGNOSIS — Z87440 Personal history of urinary (tract) infections: Secondary | ICD-10-CM | POA: Diagnosis not present

## 2021-04-15 LAB — COMPREHENSIVE METABOLIC PANEL
ALT: 12 U/L (ref 0–44)
AST: 23 U/L (ref 15–41)
Albumin: 3.8 g/dL (ref 3.5–5.0)
Alkaline Phosphatase: 70 U/L (ref 38–126)
Anion gap: 6 (ref 5–15)
BUN: 16 mg/dL (ref 8–23)
CO2: 25 mmol/L (ref 22–32)
Calcium: 9.1 mg/dL (ref 8.9–10.3)
Chloride: 98 mmol/L (ref 98–111)
Creatinine, Ser: 0.99 mg/dL (ref 0.61–1.24)
GFR, Estimated: >60 mL/min (ref >60)
Glucose, Bld: 239 mg/dL — ABNORMAL HIGH (ref 70–99)
Potassium: 4.7 mmol/L (ref 3.5–5.1)
Sodium: 129 mmol/L — ABNORMAL LOW (ref 135–145)
Total Bilirubin: 0.1 mg/dL — ABNORMAL LOW (ref 0.3–1.2)
Total Protein: 8 g/dL (ref 6.5–8.1)

## 2021-04-15 LAB — CBC WITH DIFFERENTIAL/PLATELET
Abs Immature Granulocytes: 0.05 10*3/uL (ref 0.00–0.07)
Basophils Absolute: 0 10*3/uL (ref 0.0–0.1)
Basophils Relative: 0 %
Eosinophils Absolute: 0 10*3/uL (ref 0.0–0.5)
Eosinophils Relative: 1 %
HCT: 34.1 % — ABNORMAL LOW (ref 39.0–52.0)
Hemoglobin: 11.4 g/dL — ABNORMAL LOW (ref 13.0–17.0)
Immature Granulocytes: 1 %
Lymphocytes Relative: 19 %
Lymphs Abs: 1.1 10*3/uL (ref 0.7–4.0)
MCH: 31 pg (ref 26.0–34.0)
MCHC: 33.4 g/dL (ref 30.0–36.0)
MCV: 92.7 fL (ref 80.0–100.0)
Monocytes Absolute: 0.5 10*3/uL (ref 0.1–1.0)
Monocytes Relative: 9 %
Neutro Abs: 4.1 10*3/uL (ref 1.7–7.7)
Neutrophils Relative %: 70 %
Platelets: 283 10*3/uL (ref 150–400)
RBC: 3.68 MIL/uL — ABNORMAL LOW (ref 4.22–5.81)
RDW: 12.5 % (ref 11.5–15.5)
WBC: 5.8 10*3/uL (ref 4.0–10.5)
nRBC: 0 % (ref 0.0–0.2)

## 2021-04-15 MED ORDER — SODIUM CHLORIDE 0.9 % IV SOLN
INTRAVENOUS | Status: DC
  Filled 2021-04-15: qty 250

## 2021-04-15 MED ORDER — TRAMADOL HCL 50 MG PO TABS: 50.0000 mg | ORAL_TABLET | Freq: Four times a day (QID) | ORAL | 0 refills | Status: DC | PRN

## 2021-04-15 MED ORDER — ZOLEDRONIC ACID 4 MG/5ML IV CONC
3.0000 mg | Freq: Once | INTRAVENOUS | Status: AC
  Administered 2021-04-15: 3 mg via INTRAVENOUS
  Filled 2021-04-15: qty 3.75

## 2021-04-15 NOTE — Progress Notes (Signed)
[facility] OFFICE PROGRESS NOTE  Patient Care Team: Michael Harrier, MD as PCP - General (Internal Medicine) Michael [name], MD as PCP - Cardiology (Cardiology)   Cancer Staging  No matching staging information was found for the patient.   Oncology History Overview Note  # FEB 2019-Metastatic prostate cancer/ castrate sensitive [bulky retroperitoneal adenopathy; invasion into the bladder; rectum] Bladder Bx- prostate. On Degarelix  # April 3rd 2019- [name] [165md];  Lupron q 6 M [[id number]]; MID AUG 2019- reduced dose to 827mday  #February 2023 bone scan PET scan progression of disease -bone metastases/ metastses-on RT to left hip/ lumbar area.  # Poorly controlled DM-on insulin  # s/p right PCN [explanted]/ Foley cath [Dr.Brandon]; multiple UTIs. [s/p ID; Dr.Ravishankar]  ----------------------------------------------------------------- DIAGNOSIS: [ FEB 2019] MET. PROSTATE CA  STAGE:   IV ;GOALS: PALLIATIVE  CURRENT/MOST RECENT THERAPY [ Feb-April2019] Lupron+ [name]    Prostate cancer ([facility] 04/15/2021 -  Chemotherapy   Patient is on Treatment Plan : Prostate cancer- Docetaxel q weekly         INTERVAL HISTORY: Ambulating independently.  Accompanied by his daughter.  [name].  male pleasant patient [name]/ELigard is here for follow-up/review results of the PET scan.  Patient is currently getting radiation to his left hip/lumbar area.  It continues to hurt when getting up and moving around.  Pain is improved not resolved.  Patient is currently taking tramadol 3 times a day.    Review of Systems  Constitutional:  Positive for malaise/fatigue. Negative for chills, diaphoresis, fever and weight loss.  HENT:  Negative for nosebleeds and sore throat.   Eyes:  Negative for double vision.  Respiratory:  Negative for cough, hemoptysis, sputum production and  wheezing.   Cardiovascular:  Negative for chest pain, palpitations, orthopnea and leg swelling.  Gastrointestinal:  Negative for abdominal pain, blood in stool, constipation, diarrhea, heartburn, melena, nausea and vomiting.  Musculoskeletal:  Positive for back pain and joint pain.  Skin: Negative.  Negative for itching and rash.  Neurological:  Negative for dizziness, tingling, focal weakness, weakness and headaches.  Endo/Heme/Allergies:  Does not bruise/bleed easily.  Psychiatric/Behavioral:  Negative for depression. The patient is not nervous/anxious and does not have insomnia.      PAST MEDICAL HISTORY :  Past Medical History:  Diagnosis Date   Anemia    iron deficiency   Aortic atherosclerosis (HCC)    Bilateral carotid artery disease (HCC)    Mild plaque formation without obstructive disease noted on carotid Doppler.   Diabetes mellitus without complication ([facility]   Essential hypertension    GERD (gastroesophageal reflux disease)    Hyperlipidemia    Hypertension    Left carotid artery stenosis    Nonobstructive Coronary artery disease    a. [facility] The patient was told about 2 blockages which did not require revascularization; b. 01/2015 Cath: LM nl, LAD 10p/m, 4575m D2 30ost, LCX nl, RCA min irregs, EF 55-65%; b. 12/2018 MV: No ischemia/infarct. CT images w/ mod 3 vessel Cor Ca2+ and mild-mod Ao atherosclerosis.   [place] [facility] [place]   Raynaud disease    a. Managed w/ nifedipine.    PAST SURGICAL HISTORY :   Past Surgical History:  Procedure Laterality Date   CARDIAC CATHETERIZATION  2010   CARDIAC CATHETERIZATION N/A 02/03/2015   Procedure: Left Heart Cath and Coronary Angiography;  Surgeon: MuhRogue Jury  Ferne Reus, MD;  Location: [facility];  Service: Cardiovascular;  Laterality: N/A;   CATARACT EXTRACTION Bilateral    CYSTOSCOPY W/ RETROGRADES Bilateral 04/11/2017   Procedure: CYSTOSCOPY WITH RETROGRADE PYELOGRAM;   Surgeon: [name], MD;  Location: [facility];  Service: Urology;  Laterality: Bilateral;   CYSTOSCOPY W/ RETROGRADES Bilateral 09/12/2017   Procedure: CYSTOSCOPY WITH RETROGRADE PYELOGRAM;  Surgeon: [name], MD;  Location: [facility];  Service: Urology;  Laterality: Bilateral;   CYSTOSCOPY W/ RETROGRADES Bilateral 02/25/2018   Procedure: CYSTOSCOPY WITH RETROGRADE PYELOGRAM;  Surgeon: [name], MD;  Location: [facility];  Service: Urology;  Laterality: Bilateral;   CYSTOSCOPY W/ URETERAL STENT PLACEMENT Bilateral 09/12/2017   Procedure: CYSTOSCOPY WITH STENT REPLACEMENT;  Surgeon: [name], MD;  Location: [facility];  Service: Urology;  Laterality: Bilateral;   CYSTOSCOPY W/ URETERAL STENT REMOVAL Bilateral 02/25/2018   Procedure: CYSTOSCOPY WITH STENT REMOVAL;  Surgeon: [name], MD;  Location: [facility];  Service: Urology;  Laterality: Bilateral;   CYSTOSCOPY WITH STENT PLACEMENT Left 04/11/2017   Procedure: CYSTOSCOPY WITH STENT PLACEMENT and fulgeration;  Surgeon: [name], MD;  Location: [facility];  Service: Urology;  Laterality: Left;   CYSTOSCOPY WITH URETHRAL DILATATION Bilateral 04/11/2017   Procedure: CYSTOSCOPY WITH URETHRAL DILATATION;  Surgeon: [name], MD;  Location: [facility];  Service: Urology;  Laterality: Bilateral;   IR CONVERT RIGHT NEPHROSTOMY TO NEPHROURETERAL CATH  05/21/2017   IR NEPHROSTOMY PLACEMENT RIGHT  04/13/2017    FAMILY HISTORY :   Family History  Problem Relation Age of Onset   Hypertension Father     SOCIAL HISTORY:   Social History   Tobacco Use   Smoking status: Never   Smokeless tobacco: Never  Vaping Use   Vaping Use: Never used  Substance Use Topics   Alcohol use: No   Drug use: No    ALLERGIES:  has No Known Allergies.  MEDICATIONS:  Current Outpatient Medications  Medication Sig Dispense Refill   acetaminophen (TYLENOL) 500 MG tablet Take 500 mg by mouth every 4 (four) hours as needed for moderate pain or  fever.     albuterol (PROVENTIL HFA;VENTOLIN HFA) 108 (90 Base) MCG/ACT inhaler Inhale 2 puffs into the lungs every 4 (four) hours as needed for wheezing or shortness of breath.     atorvastatin (LIPITOR) 10 MG tablet Take 10 mg by mouth daily at 6 PM.      Calcium Carb-Cholecalciferol (CALCIUM 600+D3) 600-800 MG-UNIT TABS Take 1 tablet by mouth every evening.      COVID-19 mRNA Vac-TriS, Pfizer, (PFIZER-BIONT COVID-19 VAC-TRIS) SUSP injection Inject into the muscle. 0.3 mL 0   enzalutamide (XTANDI) 40 MG tablet Take 2 tablets (80 mg total) by mouth daily. 60 tablet 3   glimepiride (AMARYL) 2 MG tablet Take 2 mg by mouth daily with breakfast.     glucose blood (ACCU-CHEK GUIDE) test strip Use asdirected three times a day diag E11.65 100 each 3   ibuprofen (ADVIL,MOTRIN) 200 MG tablet Take 200 mg by mouth every 6 (six) hours as needed for fever or moderate pain.      iron polysaccharides (NIFEREX) 150 MG capsule Take 150 mg by mouth daily.      lisinopril (ZESTRIL) 2.5 MG tablet Take 2.5 mg by mouth daily.     metFORMIN (GLUCOPHAGE) 1000 MG tablet Take 1,000 mg by mouth 2 (two) times daily.     metoprolol succinate (TOPROL-XL) 25 MG 24 hr tablet TAKE 1 TABLET BY MOUTH EVERY DAY  FOR BLOOD PRESSURE 90 tablet 3   mirabegron ER (MYRBETRIQ) 25 MG TB24 tablet Take 1 tablet (25 mg total) by mouth daily. 90 tablet 3   Multiple Vitamins-Minerals (MULTIVITAMIN WITH MINERALS) tablet Take 1 tablet by mouth daily.      NIFEdipine (ADALAT CC) 30 MG 24 hr tablet TAKE 1 TABLET BY MOUTH EVERY DAY 90 tablet 2   omeprazole (PRILOSEC) 20 MG capsule TAKE ONE CAPSULE BY MOUTH EVERY DAY 90 capsule 2   omeprazole (PRILOSEC) 20 MG capsule Take 1 tablet by mouth daily.     traMADol (ULTRAM) 50 MG tablet Take 1 tablet (50 mg total) by mouth every 6 (six) hours as needed. 90 tablet 0   No current facility-administered medications for this visit.   Facility-Administered Medications Ordered in Other Visits  Medication Dose  Route Frequency Provider Last Rate Last Admin   0.9 %  sodium chloride infusion   Intravenous Continuous [name], MD   Stopped at 04/15/21 1000    PHYSICAL EXAMINATION: ECOG PERFORMANCE STATUS: 1 - Symptomatic but completely ambulatory  BP 126/76 (BP Location: Left Arm, Patient Position: Sitting, Cuff Size: Small)    Pulse 89    Temp (!) 97.3 F (36.3 C) (Tympanic)    Ht '5\' 6"'  (1.676 m)    Wt 130 lb (59 kg)    SpO2 100%    BMI 20.98 kg/m   Filed Weights   04/15/21 0838  Weight: 130 lb (59 kg)    Physical Exam Constitutional:      Comments: Accompanied by his daughter.  Walking himself.  HENT:     Head: Normocephalic and atraumatic.     Mouth/Throat:     Pharynx: No oropharyngeal exudate.  Eyes:     Pupils: Pupils are equal, round, and reactive to light.  Cardiovascular:     Rate and Rhythm: Normal rate and regular rhythm.  Pulmonary:     Effort: Pulmonary effort is normal. No respiratory distress.     Breath sounds: Normal breath sounds. No wheezing.  Abdominal:     General: Bowel sounds are normal. There is no distension.     Palpations: Abdomen is soft. There is no mass.     Tenderness: There is no abdominal tenderness. There is no guarding or rebound.  Musculoskeletal:        General: No tenderness. Normal range of motion.     Cervical back: Normal range of motion and neck supple.  Skin:    General: Skin is warm.  Neurological:     Mental Status: He is alert and oriented to person, place, and time.  Psychiatric:        Mood and Affect: Affect normal.     LABORATORY DATA:  I have reviewed the data as listed    Component Value Date/Time   NA 129 (L) 04/15/2021 0818   NA 136 05/07/2012 1328   K 4.7 04/15/2021 0818   K 4.5 05/07/2012 1328   CL 98 04/15/2021 0818   CL 104 05/07/2012 1328   CO2 25 04/15/2021 0818   CO2 28 05/07/2012 1328   GLUCOSE 239 (H) 04/15/2021 0818   GLUCOSE 73 05/07/2012 1328   BUN 16 04/15/2021 0818   BUN 16 05/07/2012  1328   CREATININE 0.99 04/15/2021 0818   CREATININE 0.84 05/07/2012 1328   CALCIUM 9.1 04/15/2021 0818   CALCIUM 8.8 05/07/2012 1328   PROT 8.0 04/15/2021 0818   ALBUMIN 3.8 04/15/2021 0818   AST 23 04/15/2021 0818  ALT 12 04/15/2021 0818   ALKPHOS 70 04/15/2021 0818   BILITOT 0.1 (L) 04/15/2021 0818   GFRNONAA >60 04/15/2021 0818   GFRNONAA >60 05/07/2012 1328   GFRAA >60 11/17/2019 0819   GFRAA >60 05/07/2012 1328    No results found for: SPEP, UPEP  Lab Results  Component Value Date   WBC 5.8 04/15/2021   NEUTROABS 4.1 04/15/2021   HGB 11.4 (L) 04/15/2021   HCT 34.1 (L) 04/15/2021   MCV 92.7 04/15/2021   PLT 283 04/15/2021      Chemistry      Component Value Date/Time   NA 129 (L) 04/15/2021 0818   NA 136 05/07/2012 1328   K 4.7 04/15/2021 0818   K 4.5 05/07/2012 1328   CL 98 04/15/2021 0818   CL 104 05/07/2012 1328   CO2 25 04/15/2021 0818   CO2 28 05/07/2012 1328   BUN 16 04/15/2021 0818   BUN 16 05/07/2012 1328   CREATININE 0.99 04/15/2021 0818   CREATININE 0.84 05/07/2012 1328      Component Value Date/Time   CALCIUM 9.1 04/15/2021 0818   CALCIUM 8.8 05/07/2012 1328   ALKPHOS 70 04/15/2021 0818   AST 23 04/15/2021 0818   ALT 12 04/15/2021 0818   BILITOT 0.1 (L) 04/15/2021 0818       RADIOGRAPHIC STUDIES: I have personally reviewed the radiological images as listed and agreed with the findings in the report. No results found.   ASSESSMENT & PLAN:  Prostate cancer ([place]) # Metastatic prostate cancer/[FEB 2019] castrate RESISTANT; PET scan FEB 2023-  Large lesion in the inferior LEFT iliac bone and L5 vertebral body.  Other areas include proximal femur -T2 vertebral body.  Multiple other bony areas-involved however no activity.  Positive metastatic lymph node in the LEFT pelvis; but no distant nodal metastasis or visceral metastasis  #Patient currently on Xtandi 80 mg a day [secondary to fatigue]; Eligard-continue the same. Currently on RT  [5/10].  #Discussed with patient and daughter that I would recommend systemic chemotherapy [weekly docetaxel-ordered]rather that another novel ADTs [Zytiga/prednisone].  Discussed that would recommend about 2 months of therapy; to get the disease under control/travel plans to [place].  Also discussed regarding port placement/chemotherapy education.  #Bone metastases/ metastses-currently on radiation to left hip/ lumbar area.  Also discussed regarding Zometa-potential side effects of including osteonecrosis of the jaw/hypocalcemia.  Proceed with Zometa today; every 4 weeks recommend calcium plus vitamin D.  # Anemia-iron deficiency-hemoglobin ~11-12- STABLE.  on PO iron.   # DM-type II- on metformin-STABLE [Dr.Solum].  BG-201 post BF-need to monitor closely on chemotherapy/steroids.  * eliagard- next AUG mid 2023.   # DISPOSITION:  # Zometa  # Follow up in 4 weeks; MD/labs-cbc/cmp; Zometa; Dr.B  # I reviewed the blood work- with the patient in detail; also reviewed the imaging independently [as summarized above]; and with the patient in detail.       Orders Placed This Encounter  Procedures   CBC with Differential/Platelet    Standing Status:   Future    Standing Expiration Date:   04/15/2022   Comprehensive metabolic panel    Standing Status:   Future    Standing Expiration Date:   04/15/2022    All questions were answered. The patient knows to call the clinic with any problems, questions or concerns.      [name], MD 04/15/2021 2:10 PM

## 2021-04-15 NOTE — Assessment & Plan Note (Addendum)
#   Metastatic prostate cancer/[FEB 2019] castrate RESISTANT; PET scan FEB 2023-  Large lesion in the inferior LEFT iliac bone and L5 vertebral body.  Other areas include proximal femur -T2 vertebral body.  Multiple other bony areas-involved however no activity.  Positive metastatic lymph node in the LEFT pelvis; but no distant nodal metastasis or visceral metastasis  #Patient currently on Xtandi 80 mg a day [secondary to fatigue]; Eligard-continue the same. Currently on RT [5/10].  #Discussed with patient and daughter that I would recommend systemic chemotherapy [weekly docetaxel-ordered]rather that another novel ADTs [Zytiga/prednisone].  Discussed that would recommend about 2 months of therapy; to get the disease under control/travel plans to [place].  Also discussed regarding port placement/chemotherapy education.  #Bone metastases/ metastses-currently on radiation to left hip/ lumbar area.  Also discussed regarding Zometa-potential side effects of including osteonecrosis of the jaw/hypocalcemia.  Proceed with Zometa today; every 4 weeks recommend calcium plus vitamin D.  Discussed with Dr. Donella Stade.  # Anemia-iron deficiency-hemoglobin ~11-12- STABLE.  on PO iron.   # DM-type II- on metformin-STABLE [Dr.Solum].  BG-201 post BF-need to monitor closely on chemotherapy/steroids.  * eliagard- next AUG mid 2023.   # DISPOSITION:  # Zometa  # Follow up in 4 weeks; MD/labs-cbc/cmp; Zometa; Dr.B  # I reviewed the blood work- with the patient in detail; also reviewed the imaging independently [as summarized above]; and with the patient in detail.

## 2021-04-15 NOTE — Patient Instructions (Signed)
[facility] [place]  Discharge Instructions: Thank you for choosing [facility] to provide your oncology and hematology care.  If you have a lab appointment with the [facility], please go directly to the [facility] and check in at the registration area.  Wear comfortable clothing and clothing appropriate for easy access to any Portacath or PICC line.   We strive to give you quality time with your provider. You may need to reschedule your appointment if you arrive late (15 or more minutes).  Arriving late affects you and other patients whose appointments are after yours.  Also, if you miss three or more appointments without notifying the office, you may be dismissed from the clinic at the providers discretion.      For prescription refill requests, have your pharmacy contact our office and allow 72 hours for refills to be completed.    Today you received the following chemotherapy and/or immunotherapy agents: Zometa Zoledronic Acid Injection (Hypercalcemia, Oncology) What is this medication? ZOLEDRONIC ACID (ZOE le dron ik AS id) slows calcium loss from bones. It high calcium levels in the blood from some kinds of cancer. It may be used in other people at risk for bone loss. This medicine may be used for other purposes; ask your health care provider or pharmacist if you have questions. COMMON BRAND NAME(S): Zometa What should I tell my care team before I take this medication? They need to know if you have any of these conditions: cancer dehydration dental disease kidney disease liver disease low levels of calcium in the blood lung or breathing disease (asthma) receiving steroids like dexamethasone or prednisone an unusual or allergic reaction to zoledronic acid, other medicines, foods, dyes, or preservatives pregnant or trying to get pregnant breast-feeding How should I use this medication? This drug is injected into a vein. It is given by a  health care provider in a hospital or clinic setting. Talk to your health care provider about the use of this drug in children. Special care may be needed. Overdosage: If you think you have taken too much of this medicine contact a poison control center or emergency room at once. NOTE: This medicine is only for you. Do not share this medicine with others. What if I miss a dose? Keep appointments for follow-up doses. It is important not to miss your dose. Call your health care provider if you are unable to keep an appointment. What may interact with this medication? certain antibiotics given by injection NSAIDs, medicines for pain and inflammation, like ibuprofen or naproxen some diuretics like bumetanide, furosemide teriparatide thalidomide This list may not describe all possible interactions. Give your health care provider a list of all the medicines, herbs, non-prescription drugs, or dietary supplements you use. Also tell them if you smoke, drink alcohol, or use illegal drugs. Some items may interact with your medicine. What should I watch for while using this medication? Visit your health care provider for regular checks on your progress. It may be some time before you see the benefit from this drug. Some people who take this drug have severe bone, joint, or muscle pain. This drug may also increase your risk for jaw problems or a broken thigh bone. Tell your health care provider right away if you have severe pain in your jaw, bones, joints, or muscles. Tell you health care provider if you have any pain that does not go away or that gets worse. Tell your dentist and dental surgeon that  you are taking this drug. You should not have major dental surgery while on this drug. See your dentist to have a dental exam and fix any dental problems before starting this drug. Take good care of your teeth while on this drug. Make sure you see your dentist for regular follow-up appointments. You should make sure  you get enough calcium and vitamin D while you are taking this drug. Discuss the foods you eat and the vitamins you take with your health care provider. Check with your health care provider if you have severe diarrhea, nausea, and vomiting, or if you sweat a lot. The loss of too much body fluid may make it dangerous for you to take this drug. You may need blood work done while you are taking this drug. Do not become pregnant while taking this drug. Women should inform their health care provider if they wish to become pregnant or think they might be pregnant. There is potential for serious harm to an unborn child. Talk to your health care provider for more information. What side effects may I notice from receiving this medication? Side effects that you should report to your doctor or health care provider as soon as possible: allergic reactions (skin rash, itching or hives; swelling of the face, lips, or tongue) bone pain infection (fever, chills, cough, sore throat, pain or trouble passing urine) jaw pain, especially after dental work joint pain kidney injury (trouble passing urine or change in the amount of urine) low blood pressure (dizziness; feeling faint or lightheaded, falls; unusually weak or tired) low calcium levels (fast heartbeat; muscle cramps or pain; pain, tingling, or numbness in the hands or feet; seizures) low magnesium levels (fast, irregular heartbeat; muscle cramp or pain; muscle weakness; tremors; seizures) low red blood cell counts (trouble breathing; feeling faint; lightheaded, falls; unusually weak or tired) muscle pain redness, blistering, peeling, or loosening of the skin, including inside the mouth severe diarrhea swelling of the ankles, feet, hands trouble breathing Side effects that usually do not require medical attention (report to your doctor or health care provider if they continue or are bothersome): anxious constipation coughing depressed mood eye  irritation, itching, or pain fever general ill feeling or flu-like symptoms nausea pain, redness, or irritation at site where injected trouble sleeping This list may not describe all possible side effects. Call your doctor for medical advice about side effects. You may report side effects to FDA at 1-800-FDA-1088. Where should I keep my medication? This drug is given in a hospital or clinic. It will not be stored at home. NOTE: This sheet is a summary. It may not cover all possible information. If you have questions about this medicine, talk to your doctor, pharmacist, or health care provider.  2022 Elsevier/Gold Standard (2020-10-26 00:00:00)     To help prevent nausea and vomiting after your treatment, we encourage you to take your nausea medication as directed.  BELOW ARE SYMPTOMS THAT SHOULD BE REPORTED IMMEDIATELY: *FEVER GREATER THAN 100.4 F (38 C) OR HIGHER *CHILLS OR SWEATING *NAUSEA AND VOMITING THAT IS NOT CONTROLLED WITH YOUR NAUSEA MEDICATION *UNUSUAL SHORTNESS OF BREATH *UNUSUAL BRUISING OR BLEEDING *URINARY PROBLEMS (pain or burning when urinating, or frequent urination) *BOWEL PROBLEMS (unusual diarrhea, constipation, pain near the anus) TENDERNESS IN MOUTH AND THROAT WITH OR WITHOUT PRESENCE OF ULCERS (sore throat, sores in mouth, or a toothache) UNUSUAL RASH, SWELLING OR PAIN  UNUSUAL VAGINAL DISCHARGE OR ITCHING   Items with * indicate a potential emergency and should be  followed up as soon as possible or go to the Emergency Department if any problems should occur.  Please show the CHEMOTHERAPY ALERT CARD or IMMUNOTHERAPY ALERT CARD at check-in to the Emergency Department and triage nurse.  Should you have questions after your visit or need to cancel or reschedule your appointment, please contact [facility] [place] [facility] [place]  [phone] and follow the prompts.  Office hours are 8:00 a.m. to 4:30 p.m. Monday - Friday. Please note that voicemails  left after 4:00 p.m. may not be returned until the following business day.  We are closed weekends and major holidays. You have access to a nurse at all times for urgent questions. Please call the main number to the clinic [phone] and follow the prompts.  For any non-urgent questions, you may also contact your provider using MyChart. We now offer e-Visits for anyone 26 and older to request care online for non-urgent symptoms. For details visit [email or web address].   Also download the MyChart app! Go to the app store, search "MyChart", open the app, select [facility], and log in with your MyChart username and password.  Due to Covid, a mask is required upon entering the hospital/clinic. If you do not have a mask, one will be given to you upon arrival. For doctor visits, patients may have 1 support person aged 75 or older with them. For treatment visits, patients cannot have anyone with them due to current Covid guidelines and our immunocompromised population.

## 2021-04-15 NOTE — Progress Notes (Signed)
ON PATHWAY REGIMEN - Prostate  No Change  Continue With Treatment as Ordered.  Original Decision Date/Time: 05/05/2017 22:56   Docetaxel 75 mg/m2:   A cycle is every 21 days:     Docetaxel    LHRH Agonist + Bicalutamide:   A cycle is every 12 weeks:     Leuprolide acetate depot    Daily:     Bicalutamide   **Always confirm dose/schedule in your pharmacy ordering system**  Patient Characteristics: Adenocarcinoma, Metastatic, Hormone Naive, High Volume Disease* Current radiographic evidence of distant metastasis<= Yes Histology: Adenocarcinoma AJCC T Category: cTX Gleason Primary: X AJCC N Category: N1 Gleason Secondary: X AJCC M Category: M1 Gleason Score: X AJCC 8 Stage Grouping: IVB PSA Values (ng/mL): < 10  Intent of Therapy: Non-Curative / Palliative Intent, Discussed with Patient

## 2021-04-18 ENCOUNTER — Ambulatory Visit
Admission: RE | Admit: 2021-04-18 | Discharge: 2021-04-18 | Disposition: A | Discharge status: Home / Self Care | Payer: Medicare HMO | Source: Ambulatory Visit | Attending: Radiation Oncology | Admitting: Radiation Oncology

## 2021-04-18 DIAGNOSIS — Z87440 Personal history of urinary (tract) infections: Secondary | ICD-10-CM | POA: Diagnosis not present

## 2021-04-18 DIAGNOSIS — Z510 Encounter for antineoplastic radiation therapy: Secondary | ICD-10-CM | POA: Diagnosis not present

## 2021-04-18 DIAGNOSIS — E1165 Type 2 diabetes mellitus with hyperglycemia: Secondary | ICD-10-CM | POA: Diagnosis not present

## 2021-04-18 DIAGNOSIS — I700 Atherosclerosis of aorta: Secondary | ICD-10-CM | POA: Diagnosis not present

## 2021-04-18 DIAGNOSIS — C61 Malignant neoplasm of prostate: Secondary | ICD-10-CM | POA: Diagnosis not present

## 2021-04-18 DIAGNOSIS — Z79899 Other long term (current) drug therapy: Secondary | ICD-10-CM | POA: Diagnosis not present

## 2021-04-18 DIAGNOSIS — Z79818 Long term (current) use of other agents affecting estrogen receptors and estrogen levels: Secondary | ICD-10-CM | POA: Diagnosis not present

## 2021-04-18 DIAGNOSIS — C7951 Secondary malignant neoplasm of bone: Secondary | ICD-10-CM | POA: Diagnosis not present

## 2021-04-18 DIAGNOSIS — D509 Iron deficiency anemia, unspecified: Secondary | ICD-10-CM | POA: Diagnosis not present

## 2021-04-19 ENCOUNTER — Ambulatory Visit
Admission: RE | Admit: 2021-04-19 | Discharge: 2021-04-19 | Disposition: A | Discharge status: Home / Self Care | Payer: Medicare HMO | Source: Ambulatory Visit | Attending: Radiation Oncology | Admitting: Radiation Oncology

## 2021-04-19 DIAGNOSIS — C7951 Secondary malignant neoplasm of bone: Secondary | ICD-10-CM | POA: Diagnosis not present

## 2021-04-19 DIAGNOSIS — C61 Malignant neoplasm of prostate: Secondary | ICD-10-CM | POA: Diagnosis not present

## 2021-04-19 DIAGNOSIS — Z87440 Personal history of urinary (tract) infections: Secondary | ICD-10-CM | POA: Diagnosis not present

## 2021-04-19 DIAGNOSIS — D509 Iron deficiency anemia, unspecified: Secondary | ICD-10-CM | POA: Diagnosis not present

## 2021-04-19 DIAGNOSIS — I700 Atherosclerosis of aorta: Secondary | ICD-10-CM | POA: Diagnosis not present

## 2021-04-19 DIAGNOSIS — E1165 Type 2 diabetes mellitus with hyperglycemia: Secondary | ICD-10-CM | POA: Diagnosis not present

## 2021-04-19 DIAGNOSIS — Z79899 Other long term (current) drug therapy: Secondary | ICD-10-CM | POA: Diagnosis not present

## 2021-04-19 DIAGNOSIS — Z79818 Long term (current) use of other agents affecting estrogen receptors and estrogen levels: Secondary | ICD-10-CM | POA: Diagnosis not present

## 2021-04-19 DIAGNOSIS — Z510 Encounter for antineoplastic radiation therapy: Secondary | ICD-10-CM | POA: Diagnosis not present

## 2021-04-20 ENCOUNTER — Ambulatory Visit
Admission: RE | Admit: 2021-04-20 | Discharge: 2021-04-20 | Disposition: A | Discharge status: Home / Self Care | Payer: Medicare HMO | Source: Ambulatory Visit | Attending: Radiation Oncology | Admitting: Radiation Oncology

## 2021-04-20 DIAGNOSIS — Z510 Encounter for antineoplastic radiation therapy: Secondary | ICD-10-CM | POA: Insufficient documentation

## 2021-04-20 DIAGNOSIS — C61 Malignant neoplasm of prostate: Secondary | ICD-10-CM | POA: Diagnosis not present

## 2021-04-20 DIAGNOSIS — C7951 Secondary malignant neoplasm of bone: Secondary | ICD-10-CM | POA: Insufficient documentation

## 2021-04-21 ENCOUNTER — Ambulatory Visit
Admission: RE | Admit: 2021-04-21 | Discharge: 2021-04-21 | Disposition: A | Discharge status: Home / Self Care | Payer: Medicare HMO | Source: Ambulatory Visit | Attending: Radiation Oncology | Admitting: Radiation Oncology

## 2021-04-21 DIAGNOSIS — C61 Malignant neoplasm of prostate: Secondary | ICD-10-CM | POA: Diagnosis not present

## 2021-04-21 DIAGNOSIS — C7951 Secondary malignant neoplasm of bone: Secondary | ICD-10-CM | POA: Diagnosis not present

## 2021-04-21 DIAGNOSIS — Z510 Encounter for antineoplastic radiation therapy: Secondary | ICD-10-CM | POA: Diagnosis not present

## 2021-04-22 ENCOUNTER — Ambulatory Visit
Admission: RE | Admit: 2021-04-22 | Discharge: 2021-04-22 | Disposition: A | Discharge status: Home / Self Care | Payer: Medicare HMO | Source: Ambulatory Visit | Attending: Radiation Oncology | Admitting: Radiation Oncology

## 2021-04-22 DIAGNOSIS — C61 Malignant neoplasm of prostate: Secondary | ICD-10-CM | POA: Diagnosis not present

## 2021-04-22 DIAGNOSIS — C7951 Secondary malignant neoplasm of bone: Secondary | ICD-10-CM | POA: Diagnosis not present

## 2021-04-22 DIAGNOSIS — Z510 Encounter for antineoplastic radiation therapy: Secondary | ICD-10-CM | POA: Diagnosis not present

## 2021-04-25 ENCOUNTER — Ambulatory Visit
Admission: RE | Admit: 2021-04-25 | Discharge: 2021-04-25 | Disposition: A | Discharge status: Home / Self Care | Payer: Medicare HMO | Source: Ambulatory Visit | Attending: Radiation Oncology | Admitting: Radiation Oncology

## 2021-04-25 DIAGNOSIS — Z510 Encounter for antineoplastic radiation therapy: Secondary | ICD-10-CM | POA: Diagnosis not present

## 2021-04-25 DIAGNOSIS — C7951 Secondary malignant neoplasm of bone: Secondary | ICD-10-CM | POA: Diagnosis not present

## 2021-04-25 DIAGNOSIS — C61 Malignant neoplasm of prostate: Secondary | ICD-10-CM | POA: Diagnosis not present

## 2021-04-28 ENCOUNTER — Other Ambulatory Visit (HOSPITAL_COMMUNITY): Payer: Self-pay

## 2021-04-30 ENCOUNTER — Encounter: Payer: Self-pay | Admitting: Internal Medicine

## 2021-05-01 ENCOUNTER — Encounter: Payer: Self-pay | Admitting: Internal Medicine

## 2021-05-02 ENCOUNTER — Telehealth: Payer: Self-pay | Admitting: *Deleted

## 2021-05-02 ENCOUNTER — Other Ambulatory Visit: Payer: Self-pay | Admitting: *Deleted

## 2021-05-02 ENCOUNTER — Ambulatory Visit
Admission: RE | Admit: 2021-05-02 | Discharge: 2021-05-02 | Disposition: A | Discharge status: Home / Self Care | Payer: Medicare HMO | Attending: Hospice and Palliative Medicine | Admitting: Hospice and Palliative Medicine

## 2021-05-02 ENCOUNTER — Ambulatory Visit
Admission: RE | Admit: 2021-05-02 | Discharge: 2021-05-02 | Disposition: A | Discharge status: Home / Self Care | Payer: Medicare HMO | Source: Ambulatory Visit | Attending: Hospice and Palliative Medicine | Admitting: Hospice and Palliative Medicine

## 2021-05-02 ENCOUNTER — Encounter: Payer: Self-pay | Admitting: Hospice and Palliative Medicine

## 2021-05-02 ENCOUNTER — Other Ambulatory Visit: Payer: Self-pay

## 2021-05-02 ENCOUNTER — Ambulatory Visit: Payer: Medicare HMO

## 2021-05-02 ENCOUNTER — Inpatient Hospital Stay (HOSPITAL_BASED_OUTPATIENT_CLINIC_OR_DEPARTMENT_OTHER): Payer: Medicare HMO | Admitting: Hospice and Palliative Medicine

## 2021-05-02 ENCOUNTER — Inpatient Hospital Stay: Payer: Medicare HMO

## 2021-05-02 ENCOUNTER — Other Ambulatory Visit: Payer: Self-pay | Admitting: Internal Medicine

## 2021-05-02 ENCOUNTER — Inpatient Hospital Stay: Payer: Medicare HMO | Attending: Hospice and Palliative Medicine

## 2021-05-02 VITALS — BP 96/54 | Temp 97.6°F

## 2021-05-02 DIAGNOSIS — R509 Fever, unspecified: Secondary | ICD-10-CM

## 2021-05-02 DIAGNOSIS — G893 Neoplasm related pain (acute) (chronic): Secondary | ICD-10-CM | POA: Diagnosis not present

## 2021-05-02 DIAGNOSIS — R634 Abnormal weight loss: Secondary | ICD-10-CM | POA: Diagnosis not present

## 2021-05-02 DIAGNOSIS — R197 Diarrhea, unspecified: Secondary | ICD-10-CM

## 2021-05-02 DIAGNOSIS — K5900 Constipation, unspecified: Secondary | ICD-10-CM | POA: Diagnosis not present

## 2021-05-02 DIAGNOSIS — C61 Malignant neoplasm of prostate: Secondary | ICD-10-CM

## 2021-05-02 DIAGNOSIS — D509 Iron deficiency anemia, unspecified: Secondary | ICD-10-CM | POA: Insufficient documentation

## 2021-05-02 DIAGNOSIS — I7300 Raynaud's syndrome without gangrene: Secondary | ICD-10-CM | POA: Diagnosis not present

## 2021-05-02 DIAGNOSIS — R1030 Lower abdominal pain, unspecified: Secondary | ICD-10-CM

## 2021-05-02 DIAGNOSIS — Z79899 Other long term (current) drug therapy: Secondary | ICD-10-CM | POA: Insufficient documentation

## 2021-05-02 DIAGNOSIS — I2510 Atherosclerotic heart disease of native coronary artery without angina pectoris: Secondary | ICD-10-CM | POA: Insufficient documentation

## 2021-05-02 DIAGNOSIS — E1165 Type 2 diabetes mellitus with hyperglycemia: Secondary | ICD-10-CM | POA: Insufficient documentation

## 2021-05-02 DIAGNOSIS — E785 Hyperlipidemia, unspecified: Secondary | ICD-10-CM | POA: Diagnosis not present

## 2021-05-02 DIAGNOSIS — Z923 Personal history of irradiation: Secondary | ICD-10-CM | POA: Insufficient documentation

## 2021-05-02 DIAGNOSIS — Z7984 Long term (current) use of oral hypoglycemic drugs: Secondary | ICD-10-CM | POA: Insufficient documentation

## 2021-05-02 DIAGNOSIS — I700 Atherosclerosis of aorta: Secondary | ICD-10-CM | POA: Diagnosis not present

## 2021-05-02 DIAGNOSIS — I10 Essential (primary) hypertension: Secondary | ICD-10-CM | POA: Diagnosis not present

## 2021-05-02 DIAGNOSIS — K219 Gastro-esophageal reflux disease without esophagitis: Secondary | ICD-10-CM | POA: Insufficient documentation

## 2021-05-02 DIAGNOSIS — C7951 Secondary malignant neoplasm of bone: Secondary | ICD-10-CM | POA: Diagnosis not present

## 2021-05-02 DIAGNOSIS — R5383 Other fatigue: Secondary | ICD-10-CM | POA: Insufficient documentation

## 2021-05-02 DIAGNOSIS — R109 Unspecified abdominal pain: Secondary | ICD-10-CM | POA: Diagnosis not present

## 2021-05-02 DIAGNOSIS — R531 Weakness: Secondary | ICD-10-CM | POA: Insufficient documentation

## 2021-05-02 LAB — COMPREHENSIVE METABOLIC PANEL
ALT: 14 U/L (ref 0–44)
AST: 25 U/L (ref 15–41)
Albumin: 2.8 g/dL — ABNORMAL LOW (ref 3.5–5.0)
Alkaline Phosphatase: 46 U/L (ref 38–126)
Anion gap: 8 (ref 5–15)
BUN: 24 mg/dL — ABNORMAL HIGH (ref 8–23)
CO2: 20 mmol/L — ABNORMAL LOW (ref 22–32)
Calcium: 7.2 mg/dL — ABNORMAL LOW (ref 8.9–10.3)
Chloride: 97 mmol/L — ABNORMAL LOW (ref 98–111)
Creatinine, Ser: 1.24 mg/dL (ref 0.61–1.24)
GFR, Estimated: 57 mL/min — ABNORMAL LOW (ref >60)
Glucose, Bld: 121 mg/dL — ABNORMAL HIGH (ref 70–99)
Potassium: 3.4 mmol/L — ABNORMAL LOW (ref 3.5–5.1)
Sodium: 125 mmol/L — ABNORMAL LOW (ref 135–145)
Total Bilirubin: 0.3 mg/dL (ref 0.3–1.2)
Total Protein: 6.2 g/dL — ABNORMAL LOW (ref 6.5–8.1)

## 2021-05-02 LAB — URINALYSIS, COMPLETE (UACMP) WITH MICROSCOPIC
Bilirubin Urine: NEGATIVE
Glucose, UA: NEGATIVE mg/dL
Ketones, ur: 5 mg/dL — AB
Nitrite: NEGATIVE
Protein, ur: 100 mg/dL — AB
Specific Gravity, Urine: 1.018 (ref 1.005–1.030)
pH: 5 (ref 5.0–8.0)

## 2021-05-02 LAB — CBC WITH DIFFERENTIAL/PLATELET
Abs Immature Granulocytes: 0.1 10*3/uL — ABNORMAL HIGH (ref 0.00–0.07)
Basophils Absolute: 0.1 10*3/uL (ref 0.0–0.1)
Basophils Relative: 1 %
Eosinophils Absolute: 0 10*3/uL (ref 0.0–0.5)
Eosinophils Relative: 0 %
HCT: 34.7 % — ABNORMAL LOW (ref 39.0–52.0)
Hemoglobin: 12.1 g/dL — ABNORMAL LOW (ref 13.0–17.0)
Immature Granulocytes: 2 %
Lymphocytes Relative: 7 %
Lymphs Abs: 0.4 10*3/uL — ABNORMAL LOW (ref 0.7–4.0)
MCH: 30.9 pg (ref 26.0–34.0)
MCHC: 34.9 g/dL (ref 30.0–36.0)
MCV: 88.5 fL (ref 80.0–100.0)
Monocytes Absolute: 0.9 10*3/uL (ref 0.1–1.0)
Monocytes Relative: 18 %
Neutro Abs: 3.6 10*3/uL (ref 1.7–7.7)
Neutrophils Relative %: 72 %
Platelets: 242 10*3/uL (ref 150–400)
RBC: 3.92 MIL/uL — ABNORMAL LOW (ref 4.22–5.81)
RDW: 13 % (ref 11.5–15.5)
Smear Review: NORMAL
WBC: 5 10*3/uL (ref 4.0–10.5)
nRBC: 0 % (ref 0.0–0.2)

## 2021-05-02 LAB — MAGNESIUM: Magnesium: 1.7 mg/dL (ref 1.7–2.4)

## 2021-05-02 MED ORDER — LEVOFLOXACIN 500 MG PO TABS: 500.0000 mg | ORAL_TABLET | Freq: Every day | ORAL | 0 refills | Status: DC

## 2021-05-02 MED ORDER — SODIUM CHLORIDE 0.9 % IV SOLN
Freq: Once | INTRAVENOUS | Status: AC
  Filled 2021-05-02: qty 250

## 2021-05-02 NOTE — Telephone Encounter (Signed)
Daughter called reporting that he had fever over weekend as well as abdominal pain, constipated. She called on call who told her to give patient Miralax which she has done and he has passed 3 watery bowel movements and his abdominal is now hard and he is hurting. His fever has been 101.1 all weekend and he is still running fever this morning. He is not eating and is weak. She is asking if she should take him to ER. Please advise ?

## 2021-05-02 NOTE — Telephone Encounter (Signed)
Please see phone message on 05/02/21 ?

## 2021-05-02 NOTE — Progress Notes (Signed)
Sent script for levaquin. Spoke to daughter ?GB ?

## 2021-05-02 NOTE — Progress Notes (Signed)
I spoke to patient?s daughter regarding the concerns for abdominal pain- diverticulitis vs. Others. Reviewed that x-ray of the abdomen negative. Discussed regarding further Work Cup, including CT scan. For now want to proceed with empiric antibiotic- levaquin. I?m waiting on urine culture. ? ?Discussed with JoshBordersNP .

## 2021-05-02 NOTE — Progress Notes (Signed)
Pt states  that his abdomen lower level is hurting. He has constipation and they called on call MD and was told to give miralax and sat to Sunday and in to today he has diarrhea today. Some water and some loose formed . He does not want to eat or drink due to abd. pain ?

## 2021-05-02 NOTE — Progress Notes (Signed)
Received 1 L NS. Tolerated well. Discharged from clinic. Accompanied by his daughter. ?

## 2021-05-02 NOTE — Telephone Encounter (Signed)
Called the pt's daughter and said that dr B suggested that pt get labs first and then see [facility]. He will need to come in 12:45 for labs and see NP 1:15. She will being him and she always is with him for help ?

## 2021-05-02 NOTE — Progress Notes (Signed)
Symptom Management [place] at [facility] [phone] [phone]  Patient Care Team: [name], MD as PCP - General (Internal Medicine) Wellington [name], MD as PCP - Cardiology (Cardiology)   Name of the patient: Michael [name]  [id number]  1933/04/09   Date of visit: 05/02/21  Reason for Consult:  Michael [name] is an 88 year old man with multiple medical problems including metastatic prostate cancer with invasion into the bladder and rectum status post RT left hip/lumbar area, on treatment with weekly docetaxel.  Patient is status post right PCN and Foley catheter with history of multiple UTIs.  He has been followed by ID with Dr. Delaine Lame.  Patient last saw Dr. Rogue Bussing on 04/15/2021.  PET scan in February 2023 showed large lesion in the inferior left iliac bone and L5 vertebral body, proximal femur/T2 and metastatic lymph nodes in the left pelvis.  Patient was started on systemic chemotherapy for disease control prior to a planned trip to [place].  Patient has had worsening pain over the previous month has been managed on tramadol.  He presents to [facility] today with complaint of a fever, abdominal pain, and constipation.  Patient reports that he had constipation for about a week and spoke with on-call oncologist over the weekend who recommended MiraLAX.  Patient took a dose of MiraLAX and has subsequently had watery diarrhea/loose stools and lower quadrant abdominal pain.  He has not been eating or drinking much.  He has had nausea but no vomiting.  He reports isolated fever over the weekend of 101.2 but has not reoccurred.  No respiratory symptoms.  No urinary symptoms.  No bleeding. Patient offers no further specific complaints today.  PAST MEDICAL HISTORY: Past Medical History:  Diagnosis Date   Anemia    iron deficiency   Aortic atherosclerosis (HCC)    Bilateral carotid artery disease (HCC)    Mild plaque  formation without obstructive disease noted on carotid Doppler.   Diabetes mellitus without complication ([place])    Essential hypertension    GERD (gastroesophageal reflux disease)    Hyperlipidemia    Hypertension    Left carotid artery stenosis    Nonobstructive Coronary artery disease    a. Previous cardiac catheterization at [facility] in 2010. The patient was told about 2 blockages which did not require revascularization; b. 01/2015 Cath: LM nl, LAD 10p/m, 65md, D2 30ost, LCX nl, RCA min irregs, EF 55-65%; b. 12/2018 MV: No ischemia/infarct. CT images w/ mod 3 vessel Cor Ca2+ and mild-mod Ao atherosclerosis.   [place]   Raynaud disease    a. Managed w/ nifedipine.    PAST SURGICAL HISTORY:  Past Surgical History:  Procedure Laterality Date   CARDIAC CATHETERIZATION  2010   CARDIAC CATHETERIZATION N/A 02/03/2015   Procedure: Left Heart Cath and Coronary Angiography;  Surgeon: MWellington [name] MD;  [facility];  Service: Cardiovascular;  Laterality: N/A;   CATARACT EXTRACTION Bilateral    CYSTOSCOPY W/ RETROGRADES Bilateral 04/11/2017   Procedure: CYSTOSCOPY WITH RETROGRADE PYELOGRAM;  Surgeon: BHollice Espy MD;  Location: [facility] ORS;  Service: Urology;  Laterality: Bilateral;   CYSTOSCOPY W/ RETROGRADES Bilateral 09/12/2017   Procedure: CYSTOSCOPY WITH RETROGRADE PYELOGRAM;  Surgeon: BHollice Espy MD;  Location: [facility] OR[facility];  Service: Urology;  Laterality: Bilateral;   CYSTOSCOPY W/ RETROGRADES Bilateral 02/25/2018   Procedure: CYSTOSCOPY WITH RETROGRADE PYELOGRAM;  Surgeon: BHollice Espy MD;  Location: [facility] OR[facility];  Service: Urology;  Laterality: Bilateral;   CYSTOSCOPY  W/ URETERAL STENT PLACEMENT Bilateral 09/12/2017   Procedure: CYSTOSCOPY WITH STENT REPLACEMENT;  Surgeon: [name], MD;  Location: [facility] ORS;  Service: Urology;  Laterality: Bilateral;   CYSTOSCOPY W/ URETERAL STENT REMOVAL Bilateral 02/25/2018   Procedure: CYSTOSCOPY WITH STENT  REMOVAL;  Surgeon: [name], MD;  Location: [facility] OR[facility];  Service: Urology;  Laterality: Bilateral;   CYSTOSCOPY WITH STENT PLACEMENT Left 04/11/2017   Procedure: CYSTOSCOPY WITH STENT PLACEMENT and fulgeration;  Surgeon: [name], MD;  Location: [facility] OR[facility];  Service: Urology;  Laterality: Left;   CYSTOSCOPY WITH URETHRAL DILATATION Bilateral 04/11/2017   Procedure: CYSTOSCOPY WITH URETHRAL DILATATION;  Surgeon: [name], MD;  Location: [facility] OR[facility];  Service: Urology;  Laterality: Bilateral;   IR CONVERT RIGHT NEPHROSTOMY TO NEPHROURETERAL CATH  05/21/2017   IR NEPHROSTOMY PLACEMENT RIGHT  04/13/2017    HEMATOLOGY/ONCOLOGY HISTORY:  Oncology History Overview Note  # FEB 2019-Metastatic prostate cancer/ castrate sensitive [bulky retroperitoneal adenopathy; invasion into the bladder; rectum] Bladder Bx- prostate. On Degarelix  # April 3rd 2019- [name] [129m/d];  Lupron q 6 M [[id number]]; MID AUG 2019- reduced dose to $Remov'80mg'UVOTRV$ /day  #February 2023 bone scan PET scan progression of disease -bone metastases/ metastses-on RT to left hip/ lumbar area.  # Poorly controlled DM-on insulin  # s/p right PCN [explanted]/ Foley cath [Dr.Brandon]; multiple UTIs. [s/p ID; Dr.Ravishankar]  ----------------------------------------------------------------- DIAGNOSIS: [ FEB 2019] MET. PROSTATE CA  STAGE:   IV ;GOALS: PALLIATIVE  CURRENT/MOST RECENT THERAPY [ Feb-April2019] Lupron+ [name]    Prostate cancer ([place])  04/15/2021 -  Chemotherapy   Patient is on Treatment Plan : Prostate cancer- Docetaxel q weekly       ALLERGIES:  has No Known Allergies.  MEDICATIONS:  Current Outpatient Medications  Medication Sig Dispense Refill   acetaminophen (TYLENOL) 500 MG tablet Take 500 mg by mouth every 4 (four) hours as needed for moderate pain or fever.     albuterol (PROVENTIL HFA;VENTOLIN HFA) 108 (90 Base) MCG/ACT inhaler Inhale 2 puffs into the lungs every 4 (four) hours as needed for wheezing  or shortness of breath.     atorvastatin (LIPITOR) 10 MG tablet Take 10 mg by mouth daily at 6 PM.      Calcium Carb-Cholecalciferol (CALCIUM 600+D3) 600-800 MG-UNIT TABS Take 1 tablet by mouth every evening.      COVID-19 mRNA Vac-TriS, Pfizer, (PFIZER-BIONT COVID-19 VAC-TRIS) SUSP injection Inject into the muscle. 0.3 mL 0   enzalutamide (XTANDI) 40 MG tablet Take 2 tablets (80 mg total) by mouth daily. 60 tablet 3   glimepiride (AMARYL) 2 MG tablet Take 2 mg by mouth daily with breakfast.     glucose blood (ACCU-CHEK GUIDE) test strip Use asdirected three times a day diag E11.65 100 each 3   ibuprofen (ADVIL,MOTRIN) 200 MG tablet Take 200 mg by mouth every 6 (six) hours as needed for fever or moderate pain.      iron polysaccharides (NIFEREX) 150 MG capsule Take 150 mg by mouth daily.      lisinopril (ZESTRIL) 2.5 MG tablet Take 2.5 mg by mouth daily.     metFORMIN (GLUCOPHAGE) 1000 MG tablet Take 1,000 mg by mouth 2 (two) times daily.     metoprolol succinate (TOPROL-XL) 25 MG 24 hr tablet TAKE 1 TABLET BY MOUTH EVERY DAY FOR BLOOD PRESSURE 90 tablet 3   mirabegron ER (MYRBETRIQ) 25 MG TB24 tablet Take 1 tablet (25 mg total) by mouth daily. 90 tablet 3   Multiple Vitamins-Minerals (MULTIVITAMIN WITH MINERALS) tablet Take 1  tablet by mouth daily.      NIFEdipine (ADALAT CC) 30 MG 24 hr tablet TAKE 1 TABLET BY MOUTH EVERY DAY 90 tablet 2   omeprazole (PRILOSEC) 20 MG capsule TAKE ONE CAPSULE BY MOUTH EVERY DAY 90 capsule 2   omeprazole (PRILOSEC) 20 MG capsule Take 1 tablet by mouth daily.     traMADol (ULTRAM) 50 MG tablet Take 1 tablet (50 mg total) by mouth every 6 (six) hours as needed. 90 tablet 0   No current facility-administered medications for this visit.    VITAL SIGNS: There were no vitals taken for this visit. There were no vitals filed for this visit.  Estimated body mass index is 20.98 kg/m as calculated from the following:   Height as of 04/15/21: _0  (1.676 m).   Weight  as of 04/15/21: 130 lb (59 kg).  LABS: CBC:    Component Value Date/Time   WBC 5.8 04/15/2021 0818   HGB 11.4 (L) 04/15/2021 0818   HGB 13.7 05/07/2012 1328   HCT 34.1 (L) 04/15/2021 0818   PLT 283 04/15/2021 0818   MCV 92.7 04/15/2021 0818   NEUTROABS 4.1 04/15/2021 0818   LYMPHSABS 1.1 04/15/2021 0818   MONOABS 0.5 04/15/2021 0818   EOSABS 0.0 04/15/2021 0818   BASOSABS 0.0 04/15/2021 0818   Comprehensive Metabolic Panel:    Component Value Date/Time   NA 129 (L) 04/15/2021 0818   NA 136 05/07/2012 1328   K 4.7 04/15/2021 0818   K 4.5 05/07/2012 1328   CL 98 04/15/2021 0818   CL 104 05/07/2012 1328   CO2 25 04/15/2021 0818   CO2 28 05/07/2012 1328   BUN 16 04/15/2021 0818   BUN 16 05/07/2012 1328   CREATININE 0.99 04/15/2021 0818   CREATININE 0.84 05/07/2012 1328   GLUCOSE 239 (H) 04/15/2021 0818   GLUCOSE 73 05/07/2012 1328   CALCIUM 9.1 04/15/2021 0818   CALCIUM 8.8 05/07/2012 1328   AST 23 04/15/2021 0818   ALT 12 04/15/2021 0818   ALKPHOS 70 04/15/2021 0818   BILITOT 0.1 (L) 04/15/2021 0818   PROT 8.0 04/15/2021 0818   ALBUMIN 3.8 04/15/2021 0818    RADIOGRAPHIC STUDIES: NM PET (PSMA) SKULL TO MID THIGH  Result Date: 04/06/2021 CLINICAL DATA:  Prostate carcinoma with skeletal metastasis. LEFT hip pain EXAM: NUCLEAR MEDICINE PET SKULL BASE TO THIGH TECHNIQUE: 9.8 mCi F18 Piflufolastat (Pylarify) was injected intravenously. Full-ring PET imaging was performed from the skull base to thigh after the radiotracer. CT data was obtained and used for attenuation correction and anatomic localization. COMPARISON:  Bone scan 03/21/2021 FINDINGS: NECK No radiotracer activity in neck lymph nodes. Incidental CT finding: None CHEST No radiotracer accumulation within mediastinal or hilar lymph nodes. No suspicious pulmonary nodules on the CT scan. Incidental CT finding: None ABDOMEN/PELVIS Prostate: No radiotracer activity in the prostate gland Lymph nodes: Enlarged LEFT internal  iliac lymph node measures 17 mm (image 199, CT series 3) has intense radiotracer activity with SUV max equal 16.7. No peri aortic retroperitoneal radiotracer avid or enlarged nodes Liver: No evidence of liver metastasis Incidental CT finding: None SKELETON Multifocal intense radiotracer avid skeletal metastasis. Broad lesion in the posterior LEFT iliac bone with SUV max equal 44.8. There are multiple additional sclerotic lesions throughout the sacrum and pelvis which is not radiotracer activity. Broad lesion the occupying the entirety of the L5 vertebral body with SUV max 66.7 Additional intermittent involvement upper thoracic vertebral body and lower thoracic vertebral bodies. Underlying skeleton has  multiple sclerotic lesions some of which are associated with radiotracer activity. Intense activity associated with the expansile lesion of the RIGHT T2 vertebral body transverse processes. Proximal LEFT femur midshaft lesion (image 282 IMPRESSION: 1. Dominant finding is multifocal intense radiotracer avid active skeletal metastasis. Large lesion in the inferior LEFT iliac bone and L5 vertebral body. Scattered lesions elsewhere in the spine, pelvis as well as the proximal femur. 2. Multiple underlying sclerotic lesions throughout the entirety of the skeleton. Multiple sclerotic do not have radiotracer activity suggest treated metastasis 3. Single intense radiotracer avid metastatic lymph node in the LEFT pelvis 4. No distant nodal metastasis or visceral metastasis Electronically Signed   By: Suzy Bouchard M.D.   On: 04/06/2021 14:23    PERFORMANCE STATUS (ECOG) : 2 - Symptomatic, <50% confined to bed  Review of Systems Unless otherwise noted, a complete review of systems is negative.  Physical Exam General: NAD Cardiovascular: regular rate and rhythm Pulmonary: clear ant fields Abdomen: soft, hyperactive bowel sounds, some tenderness to palpation lower abdomen GU: no suprapubic tenderness Extremities:  no edema, no joint deformities Skin: no rashes Neurological: Weakness but otherwise nonfocal  Assessment and Plan- Patient is a 88 y.o. male metastatic prostate cancer who presents to [facility] for evaluation of fever, abdominal pain, and constipation.  Abdominal pain -unclear etiology.  Patient now having loose stools following utilization of MiraLAX over the weekend. He does have lower abdominal discomfort to palpation.  Hypoactive bowel sounds.  Symptoms could just reflect discomfort following laxatives. Labs suggestive of mild dehydration but CBC is not concerning for active infection.  We will proceed with IV fluids today.  Discussed with Dr. Rogue Bussing and will obtain stat abdominal x-ray for further evaluation.  Case and plan discussed with Dr. Rogue Bussing   Patient expressed understanding and was in agreement with this plan. He also understands that He can call clinic at any time with any questions, concerns, or complaints.   Thank you for allowing me to participate in the care of this very pleasant patient.   Time Total: 20 minutes  Visit consisted of counseling and education dealing with the complex and emotionally intense issues of symptom management in the setting of serious illness.Greater than 50%  of this time was spent counseling and coordinating care related to the above assessment and plan.  Signed by: Altha Harm, PhD, NP-C

## 2021-05-03 ENCOUNTER — Encounter: Payer: Self-pay | Admitting: Hospice and Palliative Medicine

## 2021-05-03 ENCOUNTER — Telehealth: Payer: Self-pay | Admitting: Hospice and Palliative Medicine

## 2021-05-03 NOTE — Telephone Encounter (Signed)
I spoke with patient's daughter by phone.  She was at work but felt like patient might be doing a little bit better today, at least when she talked to him on the phone earlier.  She plans to go home soon to check on him and will let us know if there are any changes or concerns. ?

## 2021-05-04 ENCOUNTER — Telehealth: Payer: Self-pay | Admitting: Hospice and Palliative Medicine

## 2021-05-04 ENCOUNTER — Other Ambulatory Visit: Payer: Self-pay

## 2021-05-04 ENCOUNTER — Encounter: Payer: Self-pay | Admitting: Intensive Care

## 2021-05-04 ENCOUNTER — Emergency Department
Admission: EM | Admit: 2021-05-04 | Discharge: 2021-05-04 | Disposition: A | Discharge status: Home / Self Care | Payer: Medicare HMO | Attending: Emergency Medicine | Admitting: Emergency Medicine

## 2021-05-04 ENCOUNTER — Ambulatory Visit
Admission: RE | Admit: 2021-05-04 | Discharge: 2021-05-04 | Disposition: A | Discharge status: Home / Self Care | Payer: Medicare HMO | Source: Ambulatory Visit | Attending: Hospice and Palliative Medicine | Admitting: Hospice and Palliative Medicine

## 2021-05-04 ENCOUNTER — Other Ambulatory Visit: Payer: Self-pay | Admitting: Hospice and Palliative Medicine

## 2021-05-04 DIAGNOSIS — C7951 Secondary malignant neoplasm of bone: Secondary | ICD-10-CM | POA: Diagnosis not present

## 2021-05-04 DIAGNOSIS — E871 Hypo-osmolality and hyponatremia: Secondary | ICD-10-CM | POA: Diagnosis not present

## 2021-05-04 DIAGNOSIS — R197 Diarrhea, unspecified: Secondary | ICD-10-CM | POA: Insufficient documentation

## 2021-05-04 DIAGNOSIS — R1030 Lower abdominal pain, unspecified: Secondary | ICD-10-CM | POA: Insufficient documentation

## 2021-05-04 DIAGNOSIS — C61 Malignant neoplasm of prostate: Secondary | ICD-10-CM | POA: Insufficient documentation

## 2021-05-04 DIAGNOSIS — N2889 Other specified disorders of kidney and ureter: Secondary | ICD-10-CM | POA: Diagnosis not present

## 2021-05-04 DIAGNOSIS — Z5321 Procedure and treatment not carried out due to patient leaving prior to being seen by health care provider: Secondary | ICD-10-CM | POA: Diagnosis not present

## 2021-05-04 DIAGNOSIS — K3189 Other diseases of stomach and duodenum: Secondary | ICD-10-CM | POA: Diagnosis not present

## 2021-05-04 DIAGNOSIS — N3289 Other specified disorders of bladder: Secondary | ICD-10-CM | POA: Diagnosis not present

## 2021-05-04 LAB — COMPREHENSIVE METABOLIC PANEL
ALT: 16 U/L (ref 0–44)
AST: 26 U/L (ref 15–41)
Albumin: 2.5 g/dL — ABNORMAL LOW (ref 3.5–5.0)
Alkaline Phosphatase: 51 U/L (ref 38–126)
Anion gap: 8 (ref 5–15)
BUN: 15 mg/dL (ref 8–23)
CO2: 25 mmol/L (ref 22–32)
Calcium: 7.4 mg/dL — ABNORMAL LOW (ref 8.9–10.3)
Chloride: 98 mmol/L (ref 98–111)
Creatinine, Ser: 0.95 mg/dL (ref 0.61–1.24)
GFR, Estimated: >60 mL/min (ref >60)
Glucose, Bld: 223 mg/dL — ABNORMAL HIGH (ref 70–99)
Potassium: 4.2 mmol/L (ref 3.5–5.1)
Sodium: 131 mmol/L — ABNORMAL LOW (ref 135–145)
Total Bilirubin: 0.4 mg/dL (ref 0.3–1.2)
Total Protein: 6.2 g/dL — ABNORMAL LOW (ref 6.5–8.1)

## 2021-05-04 LAB — CBC WITH DIFFERENTIAL/PLATELET
Abs Immature Granulocytes: 0.3 10*3/uL — ABNORMAL HIGH (ref 0.00–0.07)
Basophils Absolute: 0.1 10*3/uL (ref 0.0–0.1)
Basophils Relative: 1 %
Eosinophils Absolute: 0.1 10*3/uL (ref 0.0–0.5)
Eosinophils Relative: 1 %
HCT: 34.1 % — ABNORMAL LOW (ref 39.0–52.0)
Hemoglobin: 11.5 g/dL — ABNORMAL LOW (ref 13.0–17.0)
Immature Granulocytes: 6 %
Lymphocytes Relative: 9 %
Lymphs Abs: 0.4 10*3/uL — ABNORMAL LOW (ref 0.7–4.0)
MCH: 30.1 pg (ref 26.0–34.0)
MCHC: 33.7 g/dL (ref 30.0–36.0)
MCV: 89.3 fL (ref 80.0–100.0)
Monocytes Absolute: 0.9 10*3/uL (ref 0.1–1.0)
Monocytes Relative: 19 %
Neutro Abs: 3.1 10*3/uL (ref 1.7–7.7)
Neutrophils Relative %: 64 %
Platelets: 247 10*3/uL (ref 150–400)
RBC: 3.82 MIL/uL — ABNORMAL LOW (ref 4.22–5.81)
RDW: 13.2 % (ref 11.5–15.5)
Smear Review: NORMAL
WBC: 4.8 10*3/uL (ref 4.0–10.5)
nRBC: 0 % (ref 0.0–0.2)

## 2021-05-04 MED ORDER — IOHEXOL 300 MG/ML  SOLN
80.0000 mL | Freq: Once | INTRAMUSCULAR | Status: AC | PRN
  Administered 2021-05-04: 80 mL via INTRAVENOUS

## 2021-05-04 NOTE — Telephone Encounter (Signed)
Been called scan for today ?

## 2021-05-04 NOTE — ED Triage Notes (Addendum)
Last received radiation 04/25/21. Takes chemo pill daily ?

## 2021-05-04 NOTE — Telephone Encounter (Signed)
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  Discussed with Dr. Rogue Bussing and I also called and spoke with patient's daughter.  Patient is feeling slightly better but is still not eating and drinking much.  He had multiple metabolic derangements when seen earlier this week.  Likely would benefit from further supportive care and possible hospitalization.  Discussed with daughter who plans to take patient to ER for further evaluation/management. ?

## 2021-05-04 NOTE — Progress Notes (Signed)
Spoke to patient?s daughter. Patient feeling much better after bowel movement after the CT contrast. He?s eating better abdominal pain better.  ? ?Follow up as planned.

## 2021-05-04 NOTE — Progress Notes (Signed)
Patient still symptomatic with abdominal bloating/pain/watery stools.  Discussed with Dr. Rogue Bussing and will proceed with abdominal CT. ?

## 2021-05-04 NOTE — ED Notes (Signed)
First RN note: ? ?Pt sent over by the cancer center.  Pt has metastatic prostate cancer and has been receiving radiation treatments.  Pt now is having diarrhea and has hyponatremia as well as a CT scan that is showing enteritis colitis.   ?

## 2021-05-04 NOTE — ED Notes (Signed)
Pt approached First RN desk and informed registration that they will be leaving and will f/u with their MD tomorrow.   ?

## 2021-05-05 ENCOUNTER — Telehealth: Payer: Self-pay | Admitting: *Deleted

## 2021-05-05 ENCOUNTER — Other Ambulatory Visit: Payer: Self-pay | Admitting: Hospice and Palliative Medicine

## 2021-05-05 MED ORDER — LEVOFLOXACIN 500 MG PO TABS: 500.0000 mg | ORAL_TABLET | Freq: Every day | ORAL | 0 refills | Status: DC

## 2021-05-05 NOTE — Telephone Encounter (Signed)
Patient is 3 hours from home going to [facility] and realized he forgot his Levaquin Daughter asking that we send a new prescription to pharmacy [facility] so that she can have it transferred to [facility] and get it ?

## 2021-05-05 NOTE — Telephone Encounter (Signed)
MyChart reply sent to inform patient.  ?

## 2021-05-05 NOTE — Telephone Encounter (Signed)
Levaquin 500 m x5 days.  ?GB ?

## 2021-05-06 ENCOUNTER — Ambulatory Visit (INDEPENDENT_AMBULATORY_CARE_PROVIDER_SITE_OTHER): Payer: Medicare Other | Admitting: Vascular Surgery

## 2021-05-06 ENCOUNTER — Encounter (INDEPENDENT_AMBULATORY_CARE_PROVIDER_SITE_OTHER): Payer: Medicare Other

## 2021-05-07 LAB — URINE CULTURE: Culture: >=100000 — AB

## 2021-05-08 ENCOUNTER — Other Ambulatory Visit: Payer: Self-pay | Admitting: Internal Medicine

## 2021-05-08 NOTE — Progress Notes (Signed)
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  Please make sure patient is clinically stable to proceed with chemotherapy; especially diabetes/steroids etc.  Given the tenuous status- please make sure you run this pt by other X covering provider.  ?Thanks ?GB ?

## 2021-05-10 ENCOUNTER — Other Ambulatory Visit: Payer: Self-pay | Admitting: *Deleted

## 2021-05-10 MED ORDER — DEXAMETHASONE 2 MG PO TABS: 2.0000 mg | ORAL_TABLET | Freq: Two times a day (BID) | ORAL | 0 refills | Status: DC

## 2021-05-10 NOTE — Progress Notes (Signed)
05/11/21 10:39 AM   Michael [name] May 19, 1933 [id number]  Referring provider:  [name], MD [address] [facility] [place],  [place] [address] Chief Complaint  Patient presents with   Incomplete bladder emptying     HPI: Michael [name] is a 88 y.o.male with a personal history of incomplete bladder emptying, prostate cancer, bilateral hydronephrosis, and chronic bacteria colonization, who presents today for a 1 year follow-up with PVR.   He is accompanied today by his daughter and son in law along with virtual interpreter.    His advanced metastatic prostate cancer is managed by Dr. Rogue Bussing on ADT/Xtandi and Delton See.  During inpatient hospital admission in 03/2017 with urinary retention, acute kidney injury with bilateral hydronephrosis he underwent biopsy of his bladder neck revealing prostate cancer and started on ADT.  He required surgical intervention for Foley catheter placement given severe bladder neck contracture at which time a left ureteral stent was placed and ultimately a right PCN.  Right PCN was later converted to indwelling double-J stent.  His most recent imagine was in the form of CT abdomen an pelvis w contrast on 05/04/2021 visualized Thick-walled bladder with nodular components anteriorly and posteriorly. Difficult to exclude metastatic disease. Borderline enlarged left external iliac lymph node, hypermetabolic on recent PET, compatible with metastatic disease. Diffuse osseous metastatic disease, better evaluated on PET 04/05/2021.  His most recent creatinine was 0.95 on 05/04/2021.   In the interim he has gotten radiation to his left hip an lumbar area. 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    He is accompanied by his wife and a [name] interpreter. He reports today that his bladder symptoms are well controlled. He denies any burning or blood in urine. He  reports burning in his abdomen. He reports today that he has to void every 2 hours.      PMH: Past Medical History:  Diagnosis Date   Anemia    iron deficiency   Aortic atherosclerosis ([place])    Bilateral carotid artery disease (HCC)    Mild plaque formation without obstructive disease noted on carotid Doppler.   Diabetes mellitus without complication ([place])    Essential hypertension    GERD (gastroesophageal reflux disease)    Hyperlipidemia    Hypertension    Left carotid artery stenosis    Nonobstructive Coronary artery disease    a. Previous cardiac catheterization at [facility] in 2010. The patient was told about 2 blockages which did not require revascularization; b. 01/2015 Cath: LM nl, LAD 10p/m, 38md, D2 30ost, LCX nl, RCA min irregs, EF 55-65%; b. 12/2018 MV: No ischemia/infarct. CT images w/ mod 3 vessel Cor Ca2+ and mild-mod Ao atherosclerosis.   Prostate cancer ([facility] 03/2017   Raynaud disease    a. Managed w/ nifedipine.    Surgical History: Past Surgical History:  Procedure Laterality Date   CARDIAC CATHETERIZATION  2010   CARDIAC CATHETERIZATION N/A 02/03/2015   Procedure: Left Heart Cath and Coronary Angiography;  Surgeon: MWellington [name] MD;  [facility];  Service: Cardiovascular;  Laterality: N/A;   CATARACT EXTRACTION Bilateral    CYSTOSCOPY W/ RETROGRADES Bilateral 04/11/2017   Procedure: CYSTOSCOPY WITH RETROGRADE PYELOGRAM;  Surgeon: BHollice Espy MD;  Location: [facility];  Service: Urology;  Laterality: Bilateral;   CYSTOSCOPY W/ RETROGRADES Bilateral 09/12/2017   Procedure: CYSTOSCOPY WITH RETROGRADE PYELOGRAM;  Surgeon: BHollice Espy MD;  Location: [facility];  Service: Urology;  Laterality:  Bilateral;   CYSTOSCOPY W/ RETROGRADES Bilateral 02/25/2018   Procedure: CYSTOSCOPY WITH RETROGRADE PYELOGRAM;  Surgeon: [name], MD;  Location: [facility];  Service: Urology;  Laterality: Bilateral;   CYSTOSCOPY W/ URETERAL STENT PLACEMENT Bilateral  09/12/2017   Procedure: CYSTOSCOPY WITH STENT REPLACEMENT;  Surgeon: [name], MD;  Location: [facility];  Service: Urology;  Laterality: Bilateral;   CYSTOSCOPY W/ URETERAL STENT REMOVAL Bilateral 02/25/2018   Procedure: CYSTOSCOPY WITH STENT REMOVAL;  Surgeon: [name], MD;  Location: [facility];  Service: Urology;  Laterality: Bilateral;   CYSTOSCOPY WITH STENT PLACEMENT Left 04/11/2017   Procedure: CYSTOSCOPY WITH STENT PLACEMENT and fulgeration;  Surgeon: [name], MD;  Location: [facility];  Service: Urology;  Laterality: Left;   CYSTOSCOPY WITH URETHRAL DILATATION Bilateral 04/11/2017   Procedure: CYSTOSCOPY WITH URETHRAL DILATATION;  Surgeon: [name], MD;  Location: [facility];  Service: Urology;  Laterality: Bilateral;   IR CONVERT RIGHT NEPHROSTOMY TO NEPHROURETERAL CATH  05/21/2017   IR NEPHROSTOMY PLACEMENT RIGHT  04/13/2017    Home Medications:  Allergies as of 05/11/2021   No Known Allergies      Medication List        Accurate as of May 11, 2021 10:39 AM. If you have any questions, ask your nurse or doctor.          acetaminophen 500 MG tablet Commonly known as: TYLENOL Take 500 mg by mouth every 4 (four) hours as needed for moderate pain or fever.   albuterol 108 (90 Base) MCG/ACT inhaler Commonly known as: VENTOLIN HFA Inhale 2 puffs into the lungs every 4 (four) hours as needed for wheezing or shortness of breath.   atorvastatin 10 MG tablet Commonly known as: LIPITOR Take 10 mg by mouth daily at 6 PM.   Calcium 600+D3 600-20 MG-MCG Tabs Generic drug: Calcium Carb-Cholecalciferol Take 1 tablet by mouth every evening.   dexamethasone 2 MG tablet Commonly known as: DECADRON Take 1 tablet (2 mg total) by mouth 2 (two) times daily with a meal.   glimepiride 2 MG tablet Commonly known as: AMARYL Take 2 mg by mouth daily with breakfast.   glucose blood test strip Commonly known as: Accu-Chek Guide Use asdirected three times a day diag  E11.65   ibuprofen 200 MG tablet Commonly known as: ADVIL Take 200 mg by mouth every 6 (six) hours as needed for fever or moderate pain.   iron polysaccharides 150 MG capsule Commonly known as: NIFEREX Take 150 mg by mouth daily.   levofloxacin 500 MG tablet Commonly known as: Levaquin Take 1 tablet (500 mg total) by mouth daily.   lisinopril 2.5 MG tablet Commonly known as: ZESTRIL Take 2.5 mg by mouth daily.   metFORMIN 1000 MG tablet Commonly known as: GLUCOPHAGE Take 1,000 mg by mouth 2 (two) times daily.   metoprolol succinate 25 MG 24 hr tablet Commonly known as: TOPROL-XL TAKE 1 TABLET BY MOUTH EVERY DAY FOR BLOOD PRESSURE   mirabegron ER 25 MG Tb24 tablet Commonly known as: Myrbetriq Take 1 tablet (25 mg total) by mouth daily.   multivitamin with minerals tablet Take 1 tablet by mouth daily.   NIFEdipine 30 MG 24 hr tablet Commonly known as: ADALAT CC TAKE 1 TABLET BY MOUTH EVERY DAY   omeprazole 20 MG capsule Commonly known as: PRILOSEC TAKE ONE CAPSULE BY MOUTH EVERY DAY What changed: Another medication with the same name was removed. Continue taking this medication, and follow the directions you see here. Changed by: [name],  MD   Pfizer-BioNT COVID-19 Vac-TriS Susp injection Generic drug: COVID-19 mRNA Vac-TriS (Pfizer) Inject into the muscle.   traMADol 50 MG tablet Commonly known as: ULTRAM Take 1 tablet (50 mg total) by mouth every 6 (six) hours as needed.   Xtandi 40 MG tablet Generic drug: enzalutamide Take 2 tablets (80 mg total) by mouth daily.        Allergies: No Known Allergies  Family History: Family History  Problem Relation Age of Onset   Hypertension Father     Social History:  reports that he has never smoked. He has never used smokeless tobacco. He reports that he does not drink alcohol and does not use drugs.   Physical Exam: BP 135/84   Pulse (!) 114   Ht '5\' 6"'$  (1.676 m)   Wt 130 lb (59 kg)   BMI 20.98  kg/m   Constitutional:  Alert and oriented, No acute distress. HEENT: [place] AT, moist mucus membranes.  Trachea midline, no masses. Cardiovascular: No clubbing, cyanosis, or edema. Respiratory: Normal respiratory effort, no increased work of breathing. Skin: No rashes, bruises or suspicious lesions. Neurologic: Grossly intact, no focal deficits, moving all 4 extremities. Psychiatric: Normal mood and affect.  Laboratory Data: Lab Results  Component Value Date   CREATININE 0.95 05/04/2021   Lab Results  Component Value Date   HGBA1C 7.2 (H) 12/24/2020   Pertinent Imaging: Results for orders placed or performed in visit on 05/11/21  BLADDER SCAN AMB NON-IMAGING  Result Value Ref Range   Scan Result 254 ml     Assessment & Plan:   Incomplete bladder emptying  - PVR remains stably elevated, will plan to follow this more closely due to diease progression and his history  - Continue Myrbetriq for frequency  - BLADDER SCAN AMB NON-IMAGING - mirabegron ER (MYRBETRIQ) 25 MG TB24 tablet; Take 1 tablet (25 mg total) by mouth daily.  Dispense: 90 tablet; Refill: 3  2. Prostate cancer [facility]) -  s/p palliative radiation will start on docetaxel   3. Bilateral hydronephrosis  - no hydronephrosis or urinary obstruction on his most recent imagine  - Creatinine WNL   4. Chronic bacteriuria - No symptomatic infections, will continue to monitor - Will treat only if he is symptomatic   Return in 6 months for PVR  I,Kailey Littlejohn,acting as a scribe for [name], MD.,have documented all relevant documentation on the behalf of [name], MD,as directed by  [name], MD while in the presence of [name], MD.  I have reviewed the above documentation for accuracy and completeness, and I agree with the above.   [name], MD   [facility] [address], [place], [place] [address] [phone]

## 2021-05-11 ENCOUNTER — Encounter: Payer: Self-pay | Admitting: Internal Medicine

## 2021-05-11 ENCOUNTER — Other Ambulatory Visit: Payer: Self-pay

## 2021-05-11 ENCOUNTER — Ambulatory Visit (INDEPENDENT_AMBULATORY_CARE_PROVIDER_SITE_OTHER): Payer: Medicare HMO | Admitting: Urology

## 2021-05-11 VITALS — BP 135/84 | HR 114 | Ht 66.0 in | Wt 130.0 lb

## 2021-05-11 DIAGNOSIS — R339 Retention of urine, unspecified: Secondary | ICD-10-CM

## 2021-05-11 LAB — BLADDER SCAN AMB NON-IMAGING: Scan Result: 254

## 2021-05-12 MED FILL — Dexamethasone Sodium Phosphate Inj 100 MG/10ML: INTRAMUSCULAR | Qty: 1 | Status: AC

## 2021-05-13 ENCOUNTER — Other Ambulatory Visit: Payer: Self-pay

## 2021-05-13 ENCOUNTER — Inpatient Hospital Stay: Payer: Medicare HMO

## 2021-05-13 ENCOUNTER — Encounter: Payer: Self-pay | Admitting: Internal Medicine

## 2021-05-13 ENCOUNTER — Inpatient Hospital Stay (HOSPITAL_BASED_OUTPATIENT_CLINIC_OR_DEPARTMENT_OTHER): Payer: Medicare HMO | Admitting: Nurse Practitioner

## 2021-05-13 VITALS — BP 125/81 | HR 116 | Temp 97.8°F | Resp 16 | Ht 66.0 in | Wt 123.7 lb

## 2021-05-13 DIAGNOSIS — R634 Abnormal weight loss: Secondary | ICD-10-CM

## 2021-05-13 DIAGNOSIS — Z79899 Other long term (current) drug therapy: Secondary | ICD-10-CM | POA: Diagnosis not present

## 2021-05-13 DIAGNOSIS — K5900 Constipation, unspecified: Secondary | ICD-10-CM | POA: Diagnosis not present

## 2021-05-13 DIAGNOSIS — K529 Noninfective gastroenteritis and colitis, unspecified: Secondary | ICD-10-CM

## 2021-05-13 DIAGNOSIS — K219 Gastro-esophageal reflux disease without esophagitis: Secondary | ICD-10-CM | POA: Diagnosis not present

## 2021-05-13 DIAGNOSIS — Z79818 Long term (current) use of other agents affecting estrogen receptors and estrogen levels: Secondary | ICD-10-CM | POA: Diagnosis not present

## 2021-05-13 DIAGNOSIS — C7951 Secondary malignant neoplasm of bone: Secondary | ICD-10-CM

## 2021-05-13 DIAGNOSIS — E785 Hyperlipidemia, unspecified: Secondary | ICD-10-CM | POA: Diagnosis not present

## 2021-05-13 DIAGNOSIS — C61 Malignant neoplasm of prostate: Secondary | ICD-10-CM

## 2021-05-13 DIAGNOSIS — Z5111 Encounter for antineoplastic chemotherapy: Secondary | ICD-10-CM

## 2021-05-13 DIAGNOSIS — R1030 Lower abdominal pain, unspecified: Secondary | ICD-10-CM | POA: Diagnosis not present

## 2021-05-13 DIAGNOSIS — Z192 Hormone resistant malignancy status: Secondary | ICD-10-CM | POA: Diagnosis not present

## 2021-05-13 DIAGNOSIS — I7300 Raynaud's syndrome without gangrene: Secondary | ICD-10-CM | POA: Diagnosis not present

## 2021-05-13 DIAGNOSIS — I700 Atherosclerosis of aorta: Secondary | ICD-10-CM | POA: Diagnosis not present

## 2021-05-13 DIAGNOSIS — E119 Type 2 diabetes mellitus without complications: Secondary | ICD-10-CM | POA: Diagnosis not present

## 2021-05-13 DIAGNOSIS — D509 Iron deficiency anemia, unspecified: Secondary | ICD-10-CM

## 2021-05-13 DIAGNOSIS — N3289 Other specified disorders of bladder: Secondary | ICD-10-CM

## 2021-05-13 DIAGNOSIS — I10 Essential (primary) hypertension: Secondary | ICD-10-CM | POA: Diagnosis not present

## 2021-05-13 DIAGNOSIS — Z7984 Long term (current) use of oral hypoglycemic drugs: Secondary | ICD-10-CM

## 2021-05-13 LAB — COMPREHENSIVE METABOLIC PANEL
ALT: 16 U/L (ref 0–44)
AST: 23 U/L (ref 15–41)
Albumin: 3 g/dL — ABNORMAL LOW (ref 3.5–5.0)
Alkaline Phosphatase: 61 U/L (ref 38–126)
Anion gap: 8 (ref 5–15)
BUN: 15 mg/dL (ref 8–23)
CO2: 26 mmol/L (ref 22–32)
Calcium: 8.5 mg/dL — ABNORMAL LOW (ref 8.9–10.3)
Chloride: 98 mmol/L (ref 98–111)
Creatinine, Ser: 1.11 mg/dL (ref 0.61–1.24)
GFR, Estimated: >60 mL/min (ref >60)
Glucose, Bld: 220 mg/dL — ABNORMAL HIGH (ref 70–99)
Potassium: 4.6 mmol/L (ref 3.5–5.1)
Sodium: 132 mmol/L — ABNORMAL LOW (ref 135–145)
Total Bilirubin: 0.3 mg/dL (ref 0.3–1.2)
Total Protein: 6.7 g/dL (ref 6.5–8.1)

## 2021-05-13 LAB — CBC WITH DIFFERENTIAL/PLATELET
Abs Immature Granulocytes: 0.16 10*3/uL — ABNORMAL HIGH (ref 0.00–0.07)
Basophils Absolute: 0.1 10*3/uL (ref 0.0–0.1)
Basophils Relative: 1 %
Eosinophils Absolute: 0 10*3/uL (ref 0.0–0.5)
Eosinophils Relative: 0 %
HCT: 34.9 % — ABNORMAL LOW (ref 39.0–52.0)
Hemoglobin: 11.6 g/dL — ABNORMAL LOW (ref 13.0–17.0)
Immature Granulocytes: 2 %
Lymphocytes Relative: 14 %
Lymphs Abs: 1 10*3/uL (ref 0.7–4.0)
MCH: 30.7 pg (ref 26.0–34.0)
MCHC: 33.2 g/dL (ref 30.0–36.0)
MCV: 92.3 fL (ref 80.0–100.0)
Monocytes Absolute: 0.9 10*3/uL (ref 0.1–1.0)
Monocytes Relative: 12 %
Neutro Abs: 5.3 10*3/uL (ref 1.7–7.7)
Neutrophils Relative %: 71 %
Platelets: 371 10*3/uL (ref 150–400)
RBC: 3.78 MIL/uL — ABNORMAL LOW (ref 4.22–5.81)
RDW: 13.5 % (ref 11.5–15.5)
WBC: 7.4 10*3/uL (ref 4.0–10.5)
nRBC: 0 % (ref 0.0–0.2)

## 2021-05-13 NOTE — Progress Notes (Signed)
[place] OFFICE PROGRESS NOTE  Patient Care Team: [name], MD as PCP - General (Internal Medicine) Wellington [name], MD as PCP - Cardiology (Cardiology)   Cancer Staging  No matching staging information was found for the patient.  Oncology History Overview Note  # FEB 2019-Metastatic prostate cancer/ castrate sensitive [bulky retroperitoneal adenopathy; invasion into the bladder; rectum] Bladder Bx- prostate. On Degarelix  # April 3rd 2019- [name] [192md];  Lupron q 6 M [[id number]]; MID AUG 2019- reduced dose to 846mday  #February 2023 bone scan PET scan progression of disease -bone metastases/ metastses-on RT to left hip/ lumbar area.  # Poorly controlled DM-on insulin  # s/p right PCN [explanted]/ Foley cath [Dr.Brandon]; multiple UTIs. [s/p ID; Dr.Ravishankar]  ----------------------------------------------------------------- DIAGNOSIS: [ FEB 2019] MET. PROSTATE CA  STAGE:   IV ;GOALS: PALLIATIVE  CURRENT/MOST RECENT THERAPY [ Feb-April2019] Lupron+ [name]    Prostate cancer ([facility] 05/13/2021 -  Chemotherapy   Patient is on Treatment Plan : Prostate cancer- Docetaxel q weekly       INTERVAL HISTORY: Ambulating independently.  Accompanied by his daughter.  GoIla Mcgill89.o. male pleasant patient [name]/ELigard is here for follow-up and consideration of docetaxel  In the inteirm he suffered radiation enteritis, constipation was seen in ER. Dr. BrRogue Bussingtarted patient on levaquin 500 mg x 5 days which he has now completed. He continues to feel weak but is slowly starting to eat again. Continues to have mild diffuse mid abdominal discomfort.   Completed radiation to left hip/lumbar area 04/25/21. Pain is stable. Intermittently requires tramadol but trying tominimize d/t constipation.    Review of Systems  Constitutional:  Positive for malaise/fatigue.  Negative for chills, diaphoresis, fever and weight loss.  HENT:  Negative for nosebleeds and sore throat.   Eyes:  Negative for double vision.  Respiratory:  Negative for cough, hemoptysis, sputum production and wheezing.   Cardiovascular:  Negative for chest pain, palpitations, orthopnea and leg swelling.  Gastrointestinal:  Positive for abdominal pain. Negative for blood in stool, constipation, diarrhea, heartburn, melena, nausea and vomiting.  Musculoskeletal:  Positive for joint pain. Negative for back pain.  Skin: Negative.  Negative for itching and rash.  Neurological:  Positive for weakness. Negative for dizziness, tingling, focal weakness and headaches.  Endo/Heme/Allergies:  Does not bruise/bleed easily.  Psychiatric/Behavioral:  Negative for depression. The patient is not nervous/anxious and does not have insomnia.      PAST MEDICAL HISTORY :  Past Medical History:  Diagnosis Date   Anemia    iron deficiency   Aortic atherosclerosis (HCC)    Bilateral carotid artery disease (HCC)    Mild plaque formation without obstructive disease noted on carotid Doppler.   Diabetes mellitus without complication ([facility]   Essential hypertension    GERD (gastroesophageal reflux disease)    Hyperlipidemia    Hypertension    Left carotid artery stenosis    Nonobstructive Coronary artery disease    a. [facility]. The patient was told about 2 blockages which did not require revascularization; b. 01/2015 Cath: LM nl, LAD 10p/m, 4531m D2 30ost, LCX nl, RCA min irregs, EF 55-65%; b. 12/2018 MV: No ischemia/infarct. CT images w/ mod 3 vessel Cor Ca2+ and mild-mod Ao atherosclerosis.   Prostate cancer ([facility]2019   Raynaud disease    a. Managed w/ nifedipine.    PAST SURGICAL HISTORY :  Past Surgical History:  Procedure Laterality Date   CARDIAC CATHETERIZATION  2010   CARDIAC CATHETERIZATION N/A 02/03/2015   Procedure: Left Heart Cath and Coronary  Angiography;  Surgeon: Wellington [name], MD;  Location: [place] [facility];  Service: Cardiovascular;  Laterality: N/A;   CATARACT EXTRACTION Bilateral    CYSTOSCOPY W/ RETROGRADES Bilateral 04/11/2017   Procedure: CYSTOSCOPY WITH RETROGRADE PYELOGRAM;  Surgeon: [name], MD;  Location: [facility];  Service: Urology;  Laterality: Bilateral;   CYSTOSCOPY W/ RETROGRADES Bilateral 09/12/2017   Procedure: CYSTOSCOPY WITH RETROGRADE PYELOGRAM;  Surgeon: [name], MD;  Location: [facility];  Service: Urology;  Laterality: Bilateral;   CYSTOSCOPY W/ RETROGRADES Bilateral 02/25/2018   Procedure: CYSTOSCOPY WITH RETROGRADE PYELOGRAM;  Surgeon: [name], MD;  Location: [facility];  Service: Urology;  Laterality: Bilateral;   CYSTOSCOPY W/ URETERAL STENT PLACEMENT Bilateral 09/12/2017   Procedure: CYSTOSCOPY WITH STENT REPLACEMENT;  Surgeon: [name], MD;  Location: [facility];  Service: Urology;  Laterality: Bilateral;   CYSTOSCOPY W/ URETERAL STENT REMOVAL Bilateral 02/25/2018   Procedure: CYSTOSCOPY WITH STENT REMOVAL;  Surgeon: [name], MD;  Location: [facility];  Service: Urology;  Laterality: Bilateral;   CYSTOSCOPY WITH STENT PLACEMENT Left 04/11/2017   Procedure: CYSTOSCOPY WITH STENT PLACEMENT and fulgeration;  Surgeon: [name], MD;  Location: [facility];  Service: Urology;  Laterality: Left;   CYSTOSCOPY WITH URETHRAL DILATATION Bilateral 04/11/2017   Procedure: CYSTOSCOPY WITH URETHRAL DILATATION;  Surgeon: [name], MD;  Location: [facility];  Service: Urology;  Laterality: Bilateral;   IR CONVERT RIGHT NEPHROSTOMY TO NEPHROURETERAL CATH  05/21/2017   IR NEPHROSTOMY PLACEMENT RIGHT  04/13/2017    FAMILY HISTORY :   Family History  Problem Relation Age of Onset   Hypertension Father     SOCIAL HISTORY:   Social History   Tobacco Use   Smoking status: Never   Smokeless tobacco: Never  Vaping Use   Vaping Use: Never used  Substance Use Topics   Alcohol use:  No   Drug use: No    ALLERGIES:  has No Known Allergies.  MEDICATIONS:  Current Outpatient Medications  Medication Sig Dispense Refill   acetaminophen (TYLENOL) 500 MG tablet Take 500 mg by mouth every 4 (four) hours as needed for moderate pain or fever.     albuterol (PROVENTIL HFA;VENTOLIN HFA) 108 (90 Base) MCG/ACT inhaler Inhale 2 puffs into the lungs every 4 (four) hours as needed for wheezing or shortness of breath.     atorvastatin (LIPITOR) 10 MG tablet Take 10 mg by mouth daily at 6 PM.      Calcium Carb-Cholecalciferol (CALCIUM 600+D3) 600-800 MG-UNIT TABS Take 1 tablet by mouth every evening.      COVID-19 mRNA Vac-TriS, Pfizer, (PFIZER-BIONT COVID-19 VAC-TRIS) SUSP injection Inject into the muscle. 0.3 mL 0   dexamethasone (DECADRON) 2 MG tablet Take 1 tablet (2 mg total) by mouth 2 (two) times daily with a meal. 14 tablet 0   enzalutamide (XTANDI) 40 MG tablet Take 2 tablets (80 mg total) by mouth daily. 60 tablet 3   glimepiride (AMARYL) 2 MG tablet Take 2 mg by mouth daily with breakfast.     glucose blood (ACCU-CHEK GUIDE) test strip Use asdirected three times a day diag E11.65 100 each 3   ibuprofen (ADVIL,MOTRIN) 200 MG tablet Take 200 mg by mouth every 6 (six) hours as needed for fever or moderate pain.      iron polysaccharides (NIFEREX) 150 MG capsule Take 150 mg  by mouth daily.      levofloxacin (LEVAQUIN) 500 MG tablet Take 1 tablet (500 mg total) by mouth daily. 5 tablet 0   lisinopril (ZESTRIL) 2.5 MG tablet Take 2.5 mg by mouth daily.     metFORMIN (GLUCOPHAGE) 1000 MG tablet Take 1,000 mg by mouth 2 (two) times daily.     metoprolol succinate (TOPROL-XL) 25 MG 24 hr tablet TAKE 1 TABLET BY MOUTH EVERY DAY FOR BLOOD PRESSURE 90 tablet 3   mirabegron ER (MYRBETRIQ) 25 MG TB24 tablet Take 1 tablet (25 mg total) by mouth daily. 90 tablet 3   Multiple Vitamins-Minerals (MULTIVITAMIN WITH MINERALS) tablet Take 1 tablet by mouth daily.      NIFEdipine (ADALAT CC) 30 MG  24 hr tablet TAKE 1 TABLET BY MOUTH EVERY DAY 90 tablet 2   omeprazole (PRILOSEC) 20 MG capsule TAKE ONE CAPSULE BY MOUTH EVERY DAY 90 capsule 2   traMADol (ULTRAM) 50 MG tablet Take 1 tablet (50 mg total) by mouth every 6 (six) hours as needed. 90 tablet 0   No current facility-administered medications for this visit.    PHYSICAL EXAMINATION: ECOG PERFORMANCE STATUS: 1 - Symptomatic but completely ambulatory  BP 125/81   Pulse (!) 116   Temp 97.8 F (36.6 C) (Tympanic)   Resp 16   Ht _0  (1.676 m)   Wt 123 lb 11.2 oz (56.1 kg)   SpO2 100%   BMI 19.97 kg/m   Filed Weights   05/13/21 0836  Weight: 123 lb 11.2 oz (56.1 kg)    Physical Exam Constitutional:      Comments: Accompanied by his daughter.  Walking himself.  HENT:     Head: Normocephalic and atraumatic.     Mouth/Throat:     Pharynx: No oropharyngeal exudate.  Cardiovascular:     Rate and Rhythm: Normal rate and regular rhythm.  Pulmonary:     Effort: Pulmonary effort is normal. No respiratory distress.     Breath sounds: Normal breath sounds. No wheezing.  Abdominal:     General: There is no distension.     Palpations: Abdomen is soft. There is no mass.     Tenderness: There is abdominal tenderness. There is no guarding or rebound.  Musculoskeletal:        General: No tenderness.     Cervical back: Normal range of motion and neck supple.  Skin:    General: Skin is warm and dry.     Coloration: Skin is not pale.  Neurological:     Mental Status: He is alert and oriented to person, place, and time.  Psychiatric:        Mood and Affect: Mood and affect normal.        Behavior: Behavior normal.     LABORATORY DATA:  I have reviewed the data as listed    Component Value Date/Time   NA 132 (L) 05/13/2021 0819   NA 136 05/07/2012 1328   K 4.6 05/13/2021 0819   K 4.5 05/07/2012 1328   CL 98 05/13/2021 0819   CL 104 05/07/2012 1328   CO2 26 05/13/2021 0819   CO2 28 05/07/2012 1328   GLUCOSE 220 (H)  05/13/2021 0819   GLUCOSE 73 05/07/2012 1328   BUN 15 05/13/2021 0819   BUN 16 05/07/2012 1328   CREATININE 1.11 05/13/2021 0819   CREATININE 0.84 05/07/2012 1328   CALCIUM 8.5 (L) 05/13/2021 0819   CALCIUM 8.8 05/07/2012 1328   PROT 6.7 05/13/2021 0819  ALBUMIN 3.0 (L) 05/13/2021 0819   AST 23 05/13/2021 0819   ALT 16 05/13/2021 0819   ALKPHOS 61 05/13/2021 0819   BILITOT 0.3 05/13/2021 0819   GFRNONAA >60 05/13/2021 0819   GFRNONAA >60 05/07/2012 1328   GFRAA >60 11/17/2019 0819   GFRAA >60 05/07/2012 1328    No results found for: SPEP, UPEP  Lab Results  Component Value Date   WBC 7.4 05/13/2021   NEUTROABS 5.3 05/13/2021   HGB 11.6 (L) 05/13/2021   HCT 34.9 (L) 05/13/2021   MCV 92.3 05/13/2021   PLT 371 05/13/2021      Chemistry      Component Value Date/Time   NA 132 (L) 05/13/2021 0819   NA 136 05/07/2012 1328   K 4.6 05/13/2021 0819   K 4.5 05/07/2012 1328   CL 98 05/13/2021 0819   CL 104 05/07/2012 1328   CO2 26 05/13/2021 0819   CO2 28 05/07/2012 1328   BUN 15 05/13/2021 0819   BUN 16 05/07/2012 1328   CREATININE 1.11 05/13/2021 0819   CREATININE 0.84 05/07/2012 1328      Component Value Date/Time   CALCIUM 8.5 (L) 05/13/2021 0819   CALCIUM 8.8 05/07/2012 1328   ALKPHOS 61 05/13/2021 0819   AST 23 05/13/2021 0819   ALT 16 05/13/2021 0819   BILITOT 0.3 05/13/2021 0819       RADIOGRAPHIC STUDIES: I have personally reviewed the radiological images as listed and agreed with the findings in the report. No results found.   ASSESSMENT & PLAN:  No problem-specific Assessment & Plan notes found for this encounter.  # Metastatic prostate cancer/[FEB 2019] castrate RESISTANT; PET scan FEB 2023-  Large lesion in the inferior LEFT iliac bone and L5 vertebral body.  Other areas include proximal femur -T2 vertebral body.  Multiple other bony areas-involved however no activity.  Positive metastatic lymph node in the LEFT pelvis; but no distant nodal  metastasis or visceral metastasis.    #Patient currently on Xtandi 80 mg a day [secondary to fatigue]; Eligard-continue the same. s/p radiation. Eligard due August 2023.   # Previously discussed adding weekly docetaxel x 2 months to get disease control. Additionally, patient has plans to travel to [place] in June. Given recent hospitalization, illness, and that he is continuing to recover, daughter wishes to put on hold. Reviewed with Dr. Janese Banks who recommends continuing current treatment and can re-evaluation as patient gets stronger in 3 weeks. Could also consider another novel ADT (zytiga/prednisone). Daughter reluctant to consider chemotherapy  or other treatments given poor tolerance to radiation. Wishes to defer treatment plan changes at this time. Dr. Janese Banks recommended re-evaluating in 3 weeks.     #Bone metastases/ metastses-completed radiation 04/25/21. S/p Zometa 2/22. Tolerating well. Discussed plan for zometa every 4 weeks in setting of bone disease. Given frailty & abdomianl pain, will hold today. Continue calcium and vitamin D.   # Constipation/entercolitis- poss sec to radiation. CT 05/04/21- abdomen pelvis for abdominal bloating & pain. Bowel thickening along mid-distal small bowel and sigmoid colon thought to be radiation enteritis/colitis. S/p levaquin x 5 days. Continues to have abdominal pain but improving.   # bladder wall thickening with nodular components- etiology? Radiation vs metastatic disease   # Anemia-iron deficiency-hemoglobin ~11-12- STABLE. On PO iron. Ok to hold oral iron if contributing to constipation/abdominal pain. If no improvement in symptoms, restart. Could consider IV iron if intolerant.    # DM-type II- on metformin and glipizide. [Dr.Solum].  BG-220 post  BF-need to monitor closely on chemotherapy/steroids. Blood sugars persistently elevated. If adding steroids, chemo may need to consider sliding scale.    * eliagard- next AUG mid 2023.   # weight loss- provided  with samples of glucerna. Will refer to [facility] for nutrition consult.    # DISPOSITION:  # hold treatment today # rtc in 3 weeks for labs (cbc, cmp, psa), Dr. Rogue Bussing, zometa.   # Zometa  # Follow up in 4 weeks; MD/labs-cbc/cmp; Zometa; Dr.B   No orders of the defined types were placed in this encounter.   All questions were answered. The patient knows to call the clinic with any problems, questions or concerns.      Verlon Au, NP 05/13/2021   CC: Dr. Rogue Bussing

## 2021-05-13 NOTE — Progress Notes (Signed)
Pt reports dark stools, constipation and abdominal pain ?

## 2021-05-15 ENCOUNTER — Encounter: Payer: Self-pay | Admitting: Internal Medicine

## 2021-05-17 ENCOUNTER — Ambulatory Visit: Payer: Medicare Other | Admitting: Urology

## 2021-05-17 ENCOUNTER — Other Ambulatory Visit (HOSPITAL_COMMUNITY): Payer: Self-pay

## 2021-05-19 ENCOUNTER — Other Ambulatory Visit (INDEPENDENT_AMBULATORY_CARE_PROVIDER_SITE_OTHER): Payer: Self-pay | Admitting: Nurse Practitioner

## 2021-05-19 DIAGNOSIS — I7300 Raynaud's syndrome without gangrene: Secondary | ICD-10-CM

## 2021-05-20 ENCOUNTER — Ambulatory Visit (INDEPENDENT_AMBULATORY_CARE_PROVIDER_SITE_OTHER): Payer: Medicare HMO

## 2021-05-20 ENCOUNTER — Ambulatory Visit (INDEPENDENT_AMBULATORY_CARE_PROVIDER_SITE_OTHER): Payer: Medicare Other | Admitting: Vascular Surgery

## 2021-05-20 DIAGNOSIS — I7300 Raynaud's syndrome without gangrene: Secondary | ICD-10-CM | POA: Diagnosis not present

## 2021-05-25 ENCOUNTER — Encounter (INDEPENDENT_AMBULATORY_CARE_PROVIDER_SITE_OTHER): Payer: Self-pay | Admitting: *Deleted

## 2021-05-25 ENCOUNTER — Other Ambulatory Visit (HOSPITAL_COMMUNITY): Payer: Self-pay

## 2021-05-30 ENCOUNTER — Encounter: Payer: Self-pay | Admitting: Radiation Oncology

## 2021-05-30 ENCOUNTER — Ambulatory Visit
Admission: RE | Admit: 2021-05-30 | Discharge: 2021-05-30 | Disposition: A | Discharge status: Home / Self Care | Payer: Medicare HMO | Source: Ambulatory Visit | Attending: Radiation Oncology | Admitting: Radiation Oncology

## 2021-05-30 VITALS — BP 143/85 | HR 89 | Temp 97.1°F | Wt 125.6 lb

## 2021-05-30 DIAGNOSIS — C7951 Secondary malignant neoplasm of bone: Secondary | ICD-10-CM

## 2021-05-30 DIAGNOSIS — C61 Malignant neoplasm of prostate: Secondary | ICD-10-CM | POA: Insufficient documentation

## 2021-05-30 DIAGNOSIS — Z923 Personal history of irradiation: Secondary | ICD-10-CM | POA: Diagnosis not present

## 2021-05-30 NOTE — Progress Notes (Signed)
Radiation Oncology ?Follow up Note ? ?Name: Michael [name]   ?Date:   05/30/2021 ?MRN:  [id number] ?DOB: 25-Jul-1933  ? ? ?This 88 y.o. male presents to the clinic today for 1 month follow-up status post palliative radiation therapy to his left SI joint and left left acetabulum for involvement of metastatic stage IV prostate cancer. ? ?REFERRING PROVIDER: [name], MD ? ?HPI: Patient is a 49-year-old male now at 1 month having completed palliative radiation therapy to his left SI joint as well as left acetabulum for involvement of metastatic stage IV prostate cancer.  He had initially some difficulty with enteritis most likely secondary to radiation.  That has cleared his bowels have stabilized.  He has had excellent palliative benefit ambulating well no significant pain in his lower back or left hip.[name]  He is currently on Xtandi as well as Eligard. ? ?COMPLICATIONS OF TREATMENT: none ? ?FOLLOW UP COMPLIANCE: keeps appointments  ? ?PHYSICAL EXAM:  ?BP (!) 143/85   Pulse 89   Temp (!) 97.1 ?F (36.2 ?C)   Wt 125 lb 9.6 oz (57 kg)   BMI 20.27 kg/m?  ?The palpation of his lumbar spine does not elicit pain range of motion of the lower extremities does not elicit pain.  Well-developed well-nourished patient in NAD. HEENT reveals PERLA, EOMI, discs not visualized.  Oral cavity is clear. No oral mucosal lesions are identified. Neck is clear without evidence of cervical or supraclavicular adenopathy. Lungs are clear to A&P. Cardiac examination is essentially unremarkable with regular rate and rhythm without murmur rub or thrill. Abdomen is benign with no organomegaly or masses noted. Motor sensory and DTR levels are equal and symmetric in the upper and lower extremities. Cranial nerves II through XII are grossly intact. Proprioception is intact. No peripheral adenopathy or edema is identified. No motor or sensory levels are noted. Crude visual fields are within normal range. ? ?RADIOLOGY RESULTS: No current films  to review ? ?PLAN: Present time patient is achieved excellent palliative benefit from radiation.  His side effects have cleared.  I will turn follow-up care over to medical oncology.  We happy to reevaluate the patient anytime should further palliative treatment be indicated. ? ?I would like to take this opportunity to thank you for allowing me to participate in the care of your patient.. ?  ? Noreene Filbert, MD ? ?

## 2021-06-03 ENCOUNTER — Encounter: Payer: Self-pay | Admitting: Internal Medicine

## 2021-06-03 ENCOUNTER — Inpatient Hospital Stay (HOSPITAL_BASED_OUTPATIENT_CLINIC_OR_DEPARTMENT_OTHER): Payer: Medicare HMO | Admitting: Internal Medicine

## 2021-06-03 ENCOUNTER — Inpatient Hospital Stay: Payer: Medicare HMO

## 2021-06-03 ENCOUNTER — Inpatient Hospital Stay: Payer: Medicare HMO | Attending: Internal Medicine

## 2021-06-03 DIAGNOSIS — E119 Type 2 diabetes mellitus without complications: Secondary | ICD-10-CM | POA: Diagnosis not present

## 2021-06-03 DIAGNOSIS — Z923 Personal history of irradiation: Secondary | ICD-10-CM | POA: Insufficient documentation

## 2021-06-03 DIAGNOSIS — Z192 Hormone resistant malignancy status: Secondary | ICD-10-CM | POA: Diagnosis not present

## 2021-06-03 DIAGNOSIS — E1165 Type 2 diabetes mellitus with hyperglycemia: Secondary | ICD-10-CM | POA: Diagnosis not present

## 2021-06-03 DIAGNOSIS — C7951 Secondary malignant neoplasm of bone: Secondary | ICD-10-CM | POA: Diagnosis not present

## 2021-06-03 DIAGNOSIS — C61 Malignant neoplasm of prostate: Secondary | ICD-10-CM

## 2021-06-03 DIAGNOSIS — D509 Iron deficiency anemia, unspecified: Secondary | ICD-10-CM | POA: Insufficient documentation

## 2021-06-03 LAB — COMPREHENSIVE METABOLIC PANEL WITH GFR
ALT: 12 U/L (ref 0–44)
AST: 20 U/L (ref 15–41)
Albumin: 3.5 g/dL (ref 3.5–5.0)
Alkaline Phosphatase: 66 U/L (ref 38–126)
Anion gap: 7 (ref 5–15)
BUN: 23 mg/dL (ref 8–23)
CO2: 26 mmol/L (ref 22–32)
Calcium: 9.1 mg/dL (ref 8.9–10.3)
Chloride: 98 mmol/L (ref 98–111)
Creatinine, Ser: 0.92 mg/dL (ref 0.61–1.24)
GFR, Estimated: >60 mL/min
Glucose, Bld: 277 mg/dL — ABNORMAL HIGH (ref 70–99)
Potassium: 4.4 mmol/L (ref 3.5–5.1)
Sodium: 131 mmol/L — ABNORMAL LOW (ref 135–145)
Total Bilirubin: 0.1 mg/dL — ABNORMAL LOW (ref 0.3–1.2)
Total Protein: 7.3 g/dL (ref 6.5–8.1)

## 2021-06-03 LAB — CBC WITH DIFFERENTIAL/PLATELET
Abs Immature Granulocytes: 0.06 10*3/uL (ref 0.00–0.07)
Basophils Absolute: 0 10*3/uL (ref 0.0–0.1)
Basophils Relative: 0 %
Eosinophils Absolute: 0.2 10*3/uL (ref 0.0–0.5)
Eosinophils Relative: 2 %
HCT: 33.7 % — ABNORMAL LOW (ref 39.0–52.0)
Hemoglobin: 11.2 g/dL — ABNORMAL LOW (ref 13.0–17.0)
Immature Granulocytes: 1 %
Lymphocytes Relative: 13 %
Lymphs Abs: 0.9 10*3/uL (ref 0.7–4.0)
MCH: 30.9 pg (ref 26.0–34.0)
MCHC: 33.2 g/dL (ref 30.0–36.0)
MCV: 93.1 fL (ref 80.0–100.0)
Monocytes Absolute: 0.6 10*3/uL (ref 0.1–1.0)
Monocytes Relative: 9 %
Neutro Abs: 4.9 10*3/uL (ref 1.7–7.7)
Neutrophils Relative %: 75 %
Platelets: 270 10*3/uL (ref 150–400)
RBC: 3.62 MIL/uL — ABNORMAL LOW (ref 4.22–5.81)
RDW: 13.6 % (ref 11.5–15.5)
WBC: 6.6 10*3/uL (ref 4.0–10.5)
nRBC: 0 % (ref 0.0–0.2)

## 2021-06-03 LAB — PSA: Prostatic Specific Antigen: 0.47 ng/mL (ref 0.00–4.00)

## 2021-06-03 NOTE — Assessment & Plan Note (Addendum)
#   Metastatic prostate cancer/[FEB 2019] castrate RESISTANT; PET scan FEB 2023-  Large lesion in the inferior LEFT iliac bone and L5 vertebral body.  Other areas include proximal femur -T2 vertebral body.  Multiple other bony areas-involved however no activity.  Positive metastatic lymph node in the LEFT pelvis; but no distant nodal metastasis or visceral metastasis ? ?#Patient currently on Xtandi 80 mg a day [secondary to fatigue]; Eligard-continue the same. ? ?#Again I reviewed the role of  docetaxel-ordererd.  However given concerns of side effects/tolerance family wants to wait until return from [place] in August.  Recommend chemotherapy education. Port placement-discuss.  Also awaiting PSA from today. ? ?#Bone metastases/ metastses-currently s/p  radiation to left hip/ lumbar area. zometa on 2/24-however hypocalcemia; today calcium is 9.1.  We will plan Zometa again at next visit. ? ?# left lower quadrant abdominal pain- ? Enteritis- resolved; monitor for now.  ? ?# Right elbow- 1 week; no trauma- on tramadol 1 every 3 days.  Recommend Voltaren gel.  If not improved recommend x-rays. ? ?# Anemia-iron deficiency-hemoglobin ~11-12- STABLE.  on PO iron.  ? ?# DM-type II- on metformin-STABLE [Dr.Solum].  BG-277 post BF-need to monitor closely on chemotherapy/steroids. ? ?#Prognosis: Discussed with patient and daughter that unfortunately cancer cannot be cured even with chemotherapy.  However, treatment will help patient live longer; and help with the symptoms.  They want to start the chemotherapy after returning from [place]. ? ?* eliagard- next AUG mid 2023.  ? ?# DISPOSITION:  ?# chemo education [re: weekly Taxoetere] ?# Follow up in 4 weeks; MD/labs-cbc/cmp;PSA; Zometa; Dr.B ? ? ? ?

## 2021-06-03 NOTE — Progress Notes (Signed)
Nutrition Assessment: ? ?Patient with metastatic stage IV prostate cancer.  Past medical history of DM, GERD, HLD, HTN, CAD.  Patient has completed palliative radiation, had radiation enteritis.  Now planning to start chemotherapy. ? ?Met with patient and daughter in clinic.  Daughter reports that patient's appetite is better.  Usually eats tortilla that she prepares for breakfast and tea.  She is trying to get him to eat protein for breakfast.  Later on snacks on fruit.  Drinks premier protein shake usually 1 time per day.  For lunch eats small amount of rice and lentil soup.  Evening has tea and meal of vegetables, tortilla, beans, no rice.  Has some constipation.  Drinks a2 milk, likes yogurt.   ? ?Patient reports weakness.  Daughter works on days when OT is in the cancer center.  ? ?Medications: amaryl, metformin,MVI, prilosec ? ?Labs: glucose 277, Na 131 ? ?Anthropometrics:  ? ?Height: 66 inches ?Weight: 125 lb today ?130 lb 3/22 ?129 lb 12/24/20 ?BMI: 20 ? ?4% weight loss in the last 3 weeks, concerning ? ? ?Estimated Energy Needs ? ?Kcals: 1425-1700  ?Protein: 71-85 g ?Fluid: 1425-1767m ? ?NUTRITION DIAGNOSIS: Inadequate oral intake related to cancer related treatment side effects as evidenced by 4% weight loss in the last 3 weeks and decreased intake ? ? ? ?INTERVENTION:  ?Encouraged adding protein food at breakfast.   ?Continue with premier protein shake (low sugar) and increase to BID if able ?Evaluation with PT/OT.  Will discuss with MGwenette Greetas daughter works on days OT is available at cancer center.  ?Contact information provided ?  ? ?MONITORING, EVALUATION, GOAL: weight trends, intake ? ? ?NEXT VISIT: Friday, May 12 during infusion speak with daughter ? ?Lluvia Gwynne B. AZenia Resides RD, LDN ?Registered Dietitian ?[phone]? ? ?

## 2021-06-03 NOTE — Progress Notes (Signed)
R shoulder pain x1 week and also having weakness.  ?

## 2021-06-03 NOTE — Patient Instructions (Signed)
Voltaren gel to right elbow 2 times a day.  ?

## 2021-06-03 NOTE — Progress Notes (Signed)
I connected with Michael [name] on 06/03/21 at  8:45 AM EDT by video enabled telemedicine visit and verified that I am speaking with the correct person using two identifiers.  ?I discussed the limitations, risks, security and privacy concerns of performing an evaluation and management service by telemedicine and the availability of in-person appointments. I also discussed with the patient that there may be a patient responsible charge related to this service. The patient expressed understanding and agreed to proceed.  ? ? ?Other persons participating in the visit and their role in the encounter: RN/medical reconciliation ?Patient?s location: office ?Provider?s location: home ? ?Oncology History Overview Note  ?# FEB 2019-Metastatic prostate cancer/ castrate sensitive [bulky retroperitoneal adenopathy; invasion into the bladder; rectum] Bladder Bx- prostate. On Degarelix ? ?# April 3rd 2019- [name] [157md];  Lupron q 6 M [[id number]]; MID AUG 2019- reduced dose to 843mday ? ?#February 2023 bone scan PET scan progression of disease -bone metastases/ metastses-on RT to left hip/ lumbar area. ? ?# Poorly controlled DM-on insulin ? ?# s/p right PCN [explanted]/ Foley cath [Dr.Brandon]; multiple UTIs. [s/p ID; Dr.Ravishankar] ? ?----------------------------------------------------------------- ?DIAGNOSIS: [ FEB 2019] MET. PROSTATE CA ? ?STAGE:   IV ;GOALS: PALLIATIVE ? ?CURRENT/MOST RECENT THERAPY [ Feb-April2019] Lupron+ [name] ? ?  ?[facility]  Chemotherapy  ? Patient is on Treatment Plan : Prostate cancer- Docetaxel q weekly  ? ?  ?  ? ? ? ?Chief Complaint: Prostate cancer ? ? ?History of present illness:Michael H [name] [age].o.  male with history of metastatic castrate resistant prostate cancer is here for follow-up. ? ?In the interim patient finishes radiation to his left hip.  Complicated by radiation enteritis.  Constipation/diarrhea resolved.  No nausea no vomiting. ? ?Patient  complains of pain in his right elbow.  No trauma.  He continues to be on Xtandi. ? ?Observation/objective: Alert & oriented x 3. In No acute distress.  ? ?Assessment and plan: ?[place] cancer ([facility]?# Metastatic prostate cancer/[FEB 2019] castrate RESISTANT; PET scan FEB 2023-  Large lesion in the inferior LEFT iliac bone and L5 vertebral body.  Other areas include proximal femur -T2 vertebral body.  Multiple other bony areas-involved however no activity.  Positive metastatic lymph node in the LEFT pelvis; but no distant nodal metastasis or visceral metastasis ? ?#Patient currently on Xtandi 80 mg a day [secondary to fatigue]; Eligard-continue the same. ? ?#Again I reviewed the role of  docetaxel-ordererd.  However given concerns of side effects/tolerance family wants to wait [place].  Recommend chemotherapy education. Port placement-discuss.  Also awaiting PSA from today. ? ?#Bone metastases/ metastses-currently s/p  radiation to left hip/ lumbar area. zometa on 2/24-however hypocalcemia; today calcium is 9.1.  We will plan Zometa again at next visit. ? ?# left lower quadrant abdominal pain- ? Enteritis- resolved; monitor for now.  ? ?# Right elbow- 1 week; no trauma- on tramadol 1 every 3 days.  Recommend Voltaren gel.  If not improved recommend x-rays. ? ?# Anemia-iron deficiency-hemoglobin ~11-12- STABLE.  on PO iron.  ? ?# DM-type II- on metformin-STABLE [Dr.Solum].  BG-277 post BF-need to monitor closely on chemotherapy/steroids. ? ?#Prognosis: Discussed with patient and daughter that unfortunately cancer cannot be cured even with chemotherapy.  However, treatment will help patient live longer; and help with the symptoms.  They [place] ?* eliagard- next AUG mid 2023.  ? ?# DISPOSITION:  ?# chemo education [re: weekly  Taxoetere] ?# Follow up in 4 weeks; MD/labs-cbc/cmp;PSA; Zometa; Dr.B ? ? ? ?Follow-up instructions: ? ?I discussed  the assessment and treatment plan with the patient.  The patient was provided an opportunity to ask questions and all were answered.  The patient agreed with the plan and demonstrated understanding of instructions. ? ?The patient was advised to call back or seek an in person evaluation if the symptoms worsen or if the condition fails to improve as anticipated. ? ? ?Dr. Charlaine Dalton ?[facility] ?06/03/2021 ?9:10 AM ?

## 2021-06-07 ENCOUNTER — Other Ambulatory Visit: Payer: Medicare HMO

## 2021-06-08 ENCOUNTER — Encounter: Payer: Self-pay | Admitting: Internal Medicine

## 2021-06-09 ENCOUNTER — Other Ambulatory Visit: Payer: Self-pay | Admitting: Internal Medicine

## 2021-06-09 ENCOUNTER — Telehealth: Payer: Self-pay

## 2021-06-09 ENCOUNTER — Telehealth: Payer: Self-pay | Admitting: Internal Medicine

## 2021-06-09 ENCOUNTER — Other Ambulatory Visit: Payer: Self-pay

## 2021-06-09 ENCOUNTER — Ambulatory Visit
Admission: RE | Admit: 2021-06-09 | Discharge: 2021-06-09 | Disposition: A | Discharge status: Home / Self Care | Payer: Medicare HMO | Source: Ambulatory Visit | Attending: Internal Medicine | Admitting: Internal Medicine

## 2021-06-09 ENCOUNTER — Inpatient Hospital Stay: Payer: Medicare HMO

## 2021-06-09 DIAGNOSIS — C61 Malignant neoplasm of prostate: Secondary | ICD-10-CM

## 2021-06-09 DIAGNOSIS — M25511 Pain in right shoulder: Secondary | ICD-10-CM

## 2021-06-09 NOTE — Telephone Encounter (Signed)
On 4/19-spoke to patient's daughter regarding ongoing right shoulder pain/pain.  Will order x-rays. ? ?FYI ?

## 2021-06-09 NOTE — Telephone Encounter (Signed)
Noted  

## 2021-06-09 NOTE — Telephone Encounter (Signed)
Nutrition ? ?Spoke with daughter via phone. RD has to reschedule nutrition appointment on 5/12.  Daughter requested nutrition appointment to be scheduled at next MD/infusion appointment after 5/12.   ? ?Nadiah Corbit B. Zenia Resides, RD, LDN ?Registered Dietitian ?[phone] ? ?

## 2021-06-13 ENCOUNTER — Other Ambulatory Visit (HOSPITAL_COMMUNITY): Payer: Self-pay

## 2021-06-14 ENCOUNTER — Telehealth: Payer: Self-pay | Admitting: Internal Medicine

## 2021-06-14 ENCOUNTER — Other Ambulatory Visit (HOSPITAL_COMMUNITY): Payer: Self-pay

## 2021-06-14 NOTE — Telephone Encounter (Signed)
On 4/24-I spoke to patient's daughter regarding the results of the shoulder x-rays/humeral x-rays.  Chronic changes suggestive of metastatic prostate cancer.  But no acute process noted. ? ?As per the daughter patient's pain is significantly improved on tramadol.  I discussed with the daughter my concerns for progressive prostate cancer causing his pain.  Discussed regarding initiation of chemotherapy sooner than later [patient wanting to start chemotherapy in September after trip to [place].  Daughter will discuss with family; also inform me at next visit. ? ?FYI ? ?

## 2021-06-17 ENCOUNTER — Other Ambulatory Visit (HOSPITAL_COMMUNITY): Payer: Self-pay

## 2021-06-20 DIAGNOSIS — C61 Malignant neoplasm of prostate: Secondary | ICD-10-CM | POA: Diagnosis not present

## 2021-06-20 DIAGNOSIS — E7800 Pure hypercholesterolemia, unspecified: Secondary | ICD-10-CM | POA: Diagnosis not present

## 2021-06-20 DIAGNOSIS — D649 Anemia, unspecified: Secondary | ICD-10-CM | POA: Diagnosis not present

## 2021-06-20 DIAGNOSIS — E1159 Type 2 diabetes mellitus with other circulatory complications: Secondary | ICD-10-CM | POA: Diagnosis not present

## 2021-06-20 DIAGNOSIS — I10 Essential (primary) hypertension: Secondary | ICD-10-CM | POA: Diagnosis not present

## 2021-06-21 DIAGNOSIS — D649 Anemia, unspecified: Secondary | ICD-10-CM | POA: Diagnosis not present

## 2021-06-21 DIAGNOSIS — R8290 Unspecified abnormal findings in urine: Secondary | ICD-10-CM | POA: Diagnosis not present

## 2021-06-28 DIAGNOSIS — R809 Proteinuria, unspecified: Secondary | ICD-10-CM | POA: Diagnosis not present

## 2021-06-28 DIAGNOSIS — E1159 Type 2 diabetes mellitus with other circulatory complications: Secondary | ICD-10-CM | POA: Diagnosis not present

## 2021-06-28 DIAGNOSIS — E1129 Type 2 diabetes mellitus with other diabetic kidney complication: Secondary | ICD-10-CM | POA: Diagnosis not present

## 2021-07-01 ENCOUNTER — Inpatient Hospital Stay (HOSPITAL_BASED_OUTPATIENT_CLINIC_OR_DEPARTMENT_OTHER): Payer: Medicare HMO | Admitting: Internal Medicine

## 2021-07-01 ENCOUNTER — Inpatient Hospital Stay: Payer: Medicare HMO

## 2021-07-01 ENCOUNTER — Inpatient Hospital Stay: Payer: Medicare HMO | Attending: Internal Medicine

## 2021-07-01 ENCOUNTER — Encounter: Payer: Self-pay | Admitting: Internal Medicine

## 2021-07-01 DIAGNOSIS — E785 Hyperlipidemia, unspecified: Secondary | ICD-10-CM | POA: Diagnosis not present

## 2021-07-01 DIAGNOSIS — M25521 Pain in right elbow: Secondary | ICD-10-CM | POA: Diagnosis not present

## 2021-07-01 DIAGNOSIS — I2510 Atherosclerotic heart disease of native coronary artery without angina pectoris: Secondary | ICD-10-CM | POA: Diagnosis not present

## 2021-07-01 DIAGNOSIS — I10 Essential (primary) hypertension: Secondary | ICD-10-CM | POA: Insufficient documentation

## 2021-07-01 DIAGNOSIS — C61 Malignant neoplasm of prostate: Secondary | ICD-10-CM

## 2021-07-01 DIAGNOSIS — Z79899 Other long term (current) drug therapy: Secondary | ICD-10-CM | POA: Insufficient documentation

## 2021-07-01 DIAGNOSIS — C7951 Secondary malignant neoplasm of bone: Secondary | ICD-10-CM | POA: Insufficient documentation

## 2021-07-01 DIAGNOSIS — D509 Iron deficiency anemia, unspecified: Secondary | ICD-10-CM | POA: Diagnosis not present

## 2021-07-01 DIAGNOSIS — R5383 Other fatigue: Secondary | ICD-10-CM | POA: Insufficient documentation

## 2021-07-01 DIAGNOSIS — K219 Gastro-esophageal reflux disease without esophagitis: Secondary | ICD-10-CM | POA: Insufficient documentation

## 2021-07-01 DIAGNOSIS — I7300 Raynaud's syndrome without gangrene: Secondary | ICD-10-CM | POA: Insufficient documentation

## 2021-07-01 DIAGNOSIS — Z7984 Long term (current) use of oral hypoglycemic drugs: Secondary | ICD-10-CM | POA: Diagnosis not present

## 2021-07-01 DIAGNOSIS — E119 Type 2 diabetes mellitus without complications: Secondary | ICD-10-CM | POA: Diagnosis not present

## 2021-07-01 DIAGNOSIS — E1165 Type 2 diabetes mellitus with hyperglycemia: Secondary | ICD-10-CM | POA: Diagnosis not present

## 2021-07-01 LAB — CBC WITH DIFFERENTIAL/PLATELET
Abs Immature Granulocytes: 0.05 10*3/uL (ref 0.00–0.07)
Basophils Absolute: 0 10*3/uL (ref 0.0–0.1)
Basophils Relative: 1 %
Eosinophils Absolute: 0.1 10*3/uL (ref 0.0–0.5)
Eosinophils Relative: 1 %
HCT: 33.9 % — ABNORMAL LOW (ref 39.0–52.0)
Hemoglobin: 11.2 g/dL — ABNORMAL LOW (ref 13.0–17.0)
Immature Granulocytes: 1 %
Lymphocytes Relative: 17 %
Lymphs Abs: 1 10*3/uL (ref 0.7–4.0)
MCH: 30.8 pg (ref 26.0–34.0)
MCHC: 33 g/dL (ref 30.0–36.0)
MCV: 93.1 fL (ref 80.0–100.0)
Monocytes Absolute: 0.8 10*3/uL (ref 0.1–1.0)
Monocytes Relative: 13 %
Neutro Abs: 4 10*3/uL (ref 1.7–7.7)
Neutrophils Relative %: 67 %
Platelets: 273 10*3/uL (ref 150–400)
RBC: 3.64 MIL/uL — ABNORMAL LOW (ref 4.22–5.81)
RDW: 13.5 % (ref 11.5–15.5)
WBC: 5.9 10*3/uL (ref 4.0–10.5)
nRBC: 0 % (ref 0.0–0.2)

## 2021-07-01 LAB — COMPREHENSIVE METABOLIC PANEL
ALT: 11 U/L (ref 0–44)
AST: 23 U/L (ref 15–41)
Albumin: 3.7 g/dL (ref 3.5–5.0)
Alkaline Phosphatase: 61 U/L (ref 38–126)
Anion gap: 10 (ref 5–15)
BUN: 22 mg/dL (ref 8–23)
CO2: 23 mmol/L (ref 22–32)
Calcium: 8.7 mg/dL — ABNORMAL LOW (ref 8.9–10.3)
Chloride: 100 mmol/L (ref 98–111)
Creatinine, Ser: 1 mg/dL (ref 0.61–1.24)
GFR, Estimated: >60 mL/min (ref >60)
Glucose, Bld: 163 mg/dL — ABNORMAL HIGH (ref 70–99)
Potassium: 4.4 mmol/L (ref 3.5–5.1)
Sodium: 133 mmol/L — ABNORMAL LOW (ref 135–145)
Total Bilirubin: 0.2 mg/dL — ABNORMAL LOW (ref 0.3–1.2)
Total Protein: 7.4 g/dL (ref 6.5–8.1)

## 2021-07-01 LAB — PSA: Prostatic Specific Antigen: 0.42 ng/mL (ref 0.00–4.00)

## 2021-07-01 MED ORDER — ZOLEDRONIC ACID 4 MG/5ML IV CONC
3.0000 mg | Freq: Once | INTRAVENOUS | Status: AC
  Administered 2021-07-01: 3 mg via INTRAVENOUS
  Filled 2021-07-01: qty 3.75

## 2021-07-01 MED ORDER — SODIUM CHLORIDE 0.9 % IV SOLN
Freq: Once | INTRAVENOUS | Status: AC
  Filled 2021-07-01: qty 250

## 2021-07-01 NOTE — Assessment & Plan Note (Addendum)
#   Metastatic prostate cancer/[FEB 2019] castrate RESISTANT; PET scan FEB 2023-  Large lesion in the inferior LEFT iliac bone and L5 vertebral body.  Other areas include proximal femur -T2 vertebral body.  Multiple other bony areas-involved however no activity.  Positive metastatic lymph node in the LEFT pelvis; but no distant nodal metastasis or visceral metastasis ? ?#Patient currently on Xtandi 80 mg a day [secondary to fatigue]; Eligard-continue the same.  Discussed multiple times regarding initiation of Taxotere in progression of disease-however reluctant.  Wants to try Taxotere after coming from [place] in [place] ? ?# Right elbow-for the last 4 to 5 weeks taking tramadol as needed.  Unclear etiology.  X-rays no acute process.  Recommend a bone scan for further evaluation.  High clinical concern for metastatic disease.  Also recommend evaluation with orthopedics/await PCP evaluation on 5/15.  ? ?#Bone metastases/ metastses-currently s/p  radiation to left hip/ lumbar area. zometa on 2/24-however hypocalcemia; today calcium is 8.7 -pk to proceed with  Zometa today.  See above. ? ?# Anemia-iron deficiency-hemoglobin ~11-12- STABLE.  on PO iron.  ? ?# DM-type II- on metformin-STABLE [Dr.Solum].  BG-277 post BF-need to monitor closely on chemotherapy/steroids. ? ? ?* eliagard- next AUG mid 2023.  ? ?# DISPOSITION:  ?# bone scan ASAP ?# Zometa today.  ?# follow up in 1 month- MD; labs- cbc/cmp;PSA- Dr.B ? ? ? ?

## 2021-07-01 NOTE — Progress Notes (Signed)
Right arm pain x2 weeks, 8/10. Tramadol is helping. ? ?Daughter states she thinks he is having some weakness from the radiation. ?

## 2021-07-01 NOTE — Progress Notes (Signed)
Calcium 8.7, Per Doneen Poisson CMA per Dr. Rogue Bussing okay to proceed with Zometa as scheduled.  ? ?

## 2021-07-01 NOTE — Progress Notes (Signed)
[place] OFFICE PROGRESS NOTE  Patient Care Team: [name], MD as PCP - General (Internal Medicine) Wellington [name], MD as PCP - Cardiology (Cardiology) [name], MD as Consulting Physician (Oncology)   Cancer Staging  No matching staging information was found for the patient.   Oncology History Overview Note  # FEB 2019-Metastatic prostate cancer/ castrate sensitive [bulky retroperitoneal adenopathy; invasion into the bladder; rectum] Bladder Bx- prostate. On Degarelix  # April 3rd 2019- [name] [139md];  Lupron q 6 M [[id number]]; MID AUG 2019- reduced dose to 819mday  #February 2023 bone scan PET scan progression of disease -bone metastases/ metastses-on RT to left hip/ lumbar area.  # Poorly controlled DM-on insulin  # s/p right PCN [explanted]/ Foley cath [Dr.Brandon]; multiple UTIs. [s/p ID; Dr.Ravishankar]  ----------------------------------------------------------------- DIAGNOSIS: [ FEB 2019] MET. PROSTATE CA  STAGE:   IV ;GOALS: PALLIATIVE  CURRENT/MOST RECENT THERAPY [ Feb-April2019] Lupron+ [name]    [facility] ([facility] -  Chemotherapy   Patient is on Treatment Plan : Prostate cancer- Docetaxel q weekly          INTERVAL HISTORY: Ambulating independently.  Accompanied by his daughter.  Michael LIOU89.o.  male pleasant patient [name]/ELigard is here for follow-up.  In the interim patient had x-rays of his right elbow for ongoing pain.  No acute process noted-osteoporosis/metastatic sclerotic disease.  Taking tramadol every other day.  However complains of significant pain in the elbow region.  No swelling.   Review of Systems  Constitutional:  Positive for malaise/fatigue. Negative for chills, diaphoresis, fever and weight loss.  HENT:  Negative for nosebleeds and sore throat.   Eyes:  Negative for double vision.   Respiratory:  Negative for cough, hemoptysis, sputum production and wheezing.   Cardiovascular:  Negative for chest pain, palpitations, orthopnea and leg swelling.  Gastrointestinal:  Negative for abdominal pain, blood in stool, constipation, diarrhea, heartburn, melena, nausea and vomiting.  Musculoskeletal:  Positive for back pain and joint pain.  Skin: Negative.  Negative for itching and rash.  Neurological:  Negative for dizziness, tingling, focal weakness, weakness and headaches.  Endo/Heme/Allergies:  Does not bruise/bleed easily.  Psychiatric/Behavioral:  Negative for depression. The patient is not nervous/anxious and does not have insomnia.      PAST MEDICAL HISTORY :  Past Medical History:  Diagnosis Date   Anemia    iron deficiency   Aortic atherosclerosis (HCC)    Bilateral carotid artery disease (HCC)    Mild plaque formation without obstructive disease noted on carotid Doppler.   Diabetes mellitus without complication ([facility]   Essential hypertension    GERD (gastroesophageal reflux disease)    Hyperlipidemia    Hypertension    Left carotid artery stenosis    Nonobstructive Coronary artery disease    a. [facility]. The patient was told about 2 blockages which did not require revascularization; b. 01/2015 Cath: LM nl, LAD 10p/m, 4568m D2 30ost, LCX nl, RCA min irregs, EF 55-65%; b. 12/2018 MV: No ischemia/infarct. CT images w/ mod 3 vessel Cor Ca2+ and mild-mod Ao atherosclerosis.   Prostate cancer ([facility]   Raynaud disease    a. Managed w/ nifedipine.    PAST SURGICAL HISTORY :   Past Surgical History:  Procedure Laterality Date   CARDIAC CATHETERIZATION  2010   CARDIAC CATHETERIZATION N/A 02/03/2015   Procedure: Left Heart Cath and  Coronary Angiography;  Surgeon: Wellington [name], MD;  Location: [facility];  Service: Cardiovascular;  Laterality: N/A;   CATARACT EXTRACTION Bilateral    CYSTOSCOPY W/ RETROGRADES  Bilateral 04/11/2017   Procedure: CYSTOSCOPY WITH RETROGRADE PYELOGRAM;  Surgeon: [name], MD;  Location: [facility];  Service: Urology;  Laterality: Bilateral;   CYSTOSCOPY W/ RETROGRADES Bilateral 09/12/2017   Procedure: CYSTOSCOPY WITH RETROGRADE PYELOGRAM;  Surgeon: [name], MD;  Location: [facility];  Service: Urology;  Laterality: Bilateral;   CYSTOSCOPY W/ RETROGRADES Bilateral 02/25/2018   Procedure: CYSTOSCOPY WITH RETROGRADE PYELOGRAM;  Surgeon: [name], MD;  Location: [facility];  Service: Urology;  Laterality: Bilateral;   CYSTOSCOPY W/ URETERAL STENT PLACEMENT Bilateral 09/12/2017   Procedure: CYSTOSCOPY WITH STENT REPLACEMENT;  Surgeon: [name], MD;  Location: [facility];  Service: Urology;  Laterality: Bilateral;   CYSTOSCOPY W/ URETERAL STENT REMOVAL Bilateral 02/25/2018   Procedure: CYSTOSCOPY WITH STENT REMOVAL;  Surgeon: [name], MD;  Location: [facility];  Service: Urology;  Laterality: Bilateral;   CYSTOSCOPY WITH STENT PLACEMENT Left 04/11/2017   Procedure: CYSTOSCOPY WITH STENT PLACEMENT and fulgeration;  Surgeon: [name], MD;  Location: [facility];  Service: Urology;  Laterality: Left;   CYSTOSCOPY WITH URETHRAL DILATATION Bilateral 04/11/2017   Procedure: CYSTOSCOPY WITH URETHRAL DILATATION;  Surgeon: [name], MD;  Location: [facility];  Service: Urology;  Laterality: Bilateral;   IR CONVERT RIGHT NEPHROSTOMY TO NEPHROURETERAL CATH  05/21/2017   IR NEPHROSTOMY PLACEMENT RIGHT  04/13/2017    FAMILY HISTORY :   Family History  Problem Relation Age of Onset   Hypertension Father     SOCIAL HISTORY:   Social History   Tobacco Use   Smoking status: Never   Smokeless tobacco: Never  Vaping Use   Vaping Use: Never used  Substance Use Topics   Alcohol use: No   Drug use: No    ALLERGIES:  has No Known Allergies.  MEDICATIONS:  Current Outpatient Medications  Medication Sig Dispense Refill   acetaminophen (TYLENOL) 500 MG tablet  Take 500 mg by mouth every 4 (four) hours as needed for moderate pain or fever.     albuterol (PROVENTIL HFA;VENTOLIN HFA) 108 (90 Base) MCG/ACT inhaler Inhale 2 puffs into the lungs every 4 (four) hours as needed for wheezing or shortness of breath.     atorvastatin (LIPITOR) 10 MG tablet Take 10 mg by mouth daily at 6 PM.      Calcium Carb-Cholecalciferol (CALCIUM 600+D3) 600-800 MG-UNIT TABS Take 1 tablet by mouth every evening.      COVID-19 mRNA Vac-TriS, Pfizer, (PFIZER-BIONT COVID-19 VAC-TRIS) SUSP injection Inject into the muscle. 0.3 mL 0   enzalutamide (XTANDI) 40 MG tablet Take 2 tablets (80 mg total) by mouth daily. 60 tablet 3   glimepiride (AMARYL) 2 MG tablet Take 2 mg by mouth daily with breakfast.     glucose blood (ACCU-CHEK GUIDE) test strip Use asdirected three times a day diag E11.65 100 each 3   ibuprofen (ADVIL,MOTRIN) 200 MG tablet Take 200 mg by mouth every 6 (six) hours as needed for fever or moderate pain.      iron polysaccharides (NIFEREX) 150 MG capsule Take 150 mg by mouth daily.      lisinopril (ZESTRIL) 2.5 MG tablet Take 2.5 mg by mouth daily.     metFORMIN (GLUCOPHAGE) 1000 MG tablet Take 1,000 mg by mouth 2 (two) times daily.     metoprolol succinate (TOPROL-XL) 25 MG 24 hr tablet TAKE 1  TABLET BY MOUTH EVERY DAY FOR BLOOD PRESSURE 90 tablet 3   mirabegron ER (MYRBETRIQ) 25 MG TB24 tablet Take 1 tablet (25 mg total) by mouth daily. 90 tablet 3   Multiple Vitamins-Minerals (MULTIVITAMIN WITH MINERALS) tablet Take 1 tablet by mouth daily.      NIFEdipine (ADALAT CC) 30 MG 24 hr tablet TAKE 1 TABLET BY MOUTH EVERY DAY 90 tablet 2   omeprazole (PRILOSEC) 20 MG capsule TAKE ONE CAPSULE BY MOUTH EVERY DAY 90 capsule 2   sitaGLIPtin (JANUVIA) 100 MG tablet Take 100 mg by mouth daily.     traMADol (ULTRAM) 50 MG tablet Take 1 tablet (50 mg total) by mouth every 6 (six) hours as needed. 90 tablet 0   dexamethasone (DECADRON) 2 MG tablet Take 1 tablet (2 mg total) by  mouth 2 (two) times daily with a meal. (Patient not taking: Reported on 05/30/2021) 14 tablet 0   No current facility-administered medications for this visit.    PHYSICAL EXAMINATION: ECOG PERFORMANCE STATUS: 1 - Symptomatic but completely ambulatory  BP 123/73 (BP Location: Left Arm, Patient Position: Sitting, Cuff Size: Normal)   Pulse 78   Temp (!) 97 F (36.1 C) (Tympanic)   Ht '5\' 6"'  (1.676 m)   Wt 126 lb 6.4 oz (57.3 kg)   SpO2 100%   BMI 20.40 kg/m   Filed Weights   07/01/21 0906  Weight: 126 lb 6.4 oz (57.3 kg)    Physical Exam Constitutional:      Comments: Accompanied by his daughter.  Walking himself.  HENT:     Head: Normocephalic and atraumatic.     Mouth/Throat:     Pharynx: No oropharyngeal exudate.  Eyes:     Pupils: Pupils are equal, round, and reactive to light.  Cardiovascular:     Rate and Rhythm: Normal rate and regular rhythm.  Pulmonary:     Effort: Pulmonary effort is normal. No respiratory distress.     Breath sounds: Normal breath sounds. No wheezing.  Abdominal:     General: Bowel sounds are normal. There is no distension.     Palpations: Abdomen is soft. There is no mass.     Tenderness: There is no abdominal tenderness. There is no guarding or rebound.  Musculoskeletal:        General: No tenderness. Normal range of motion.     Cervical back: Normal range of motion and neck supple.  Skin:    General: Skin is warm.  Neurological:     Mental Status: He is alert and oriented to person, place, and time.  Psychiatric:        Mood and Affect: Affect normal.     LABORATORY DATA:  I have reviewed the data as listed    Component Value Date/Time   NA 133 (L) 07/01/2021 0906   NA 136 05/07/2012 1328   K 4.4 07/01/2021 0906   K 4.5 05/07/2012 1328   CL 100 07/01/2021 0906   CL 104 05/07/2012 1328   CO2 23 07/01/2021 0906   CO2 28 05/07/2012 1328   GLUCOSE 163 (H) 07/01/2021 0906   GLUCOSE 73 05/07/2012 1328   BUN 22 07/01/2021 0906    BUN 16 05/07/2012 1328   CREATININE 1.00 07/01/2021 0906   CREATININE 0.84 05/07/2012 1328   CALCIUM 8.7 (L) 07/01/2021 0906   CALCIUM 8.8 05/07/2012 1328   PROT 7.4 07/01/2021 0906   ALBUMIN 3.7 07/01/2021 0906   AST 23 07/01/2021 0906   ALT 11 07/01/2021 0906  ALKPHOS 61 07/01/2021 0906   BILITOT 0.2 (L) 07/01/2021 0906   GFRNONAA >60 07/01/2021 0906   GFRNONAA >60 05/07/2012 1328   GFRAA >60 11/17/2019 0819   GFRAA >60 05/07/2012 1328    No results found for: SPEP, UPEP  Lab Results  Component Value Date   WBC 5.9 07/01/2021   NEUTROABS 4.0 07/01/2021   HGB 11.2 (L) 07/01/2021   HCT 33.9 (L) 07/01/2021   MCV 93.1 07/01/2021   PLT 273 07/01/2021      Chemistry      Component Value Date/Time   NA 133 (L) 07/01/2021 0906   NA 136 05/07/2012 1328   K 4.4 07/01/2021 0906   K 4.5 05/07/2012 1328   CL 100 07/01/2021 0906   CL 104 05/07/2012 1328   CO2 23 07/01/2021 0906   CO2 28 05/07/2012 1328   BUN 22 07/01/2021 0906   BUN 16 05/07/2012 1328   CREATININE 1.00 07/01/2021 0906   CREATININE 0.84 05/07/2012 1328      Component Value Date/Time   CALCIUM 8.7 (L) 07/01/2021 0906   CALCIUM 8.8 05/07/2012 1328   ALKPHOS 61 07/01/2021 0906   AST 23 07/01/2021 0906   ALT 11 07/01/2021 0906   BILITOT 0.2 (L) 07/01/2021 0906       RADIOGRAPHIC STUDIES: I have personally reviewed the radiological images as listed and agreed with the findings in the report. No results found.   ASSESSMENT & PLAN:  Prostate cancer ([place]) # Metastatic prostate cancer/[FEB 2019] castrate RESISTANT; PET scan FEB 2023-  Large lesion in the inferior LEFT iliac bone and L5 vertebral body.  Other areas include proximal femur -T2 vertebral body.  Multiple other bony areas-involved however no activity.  Positive metastatic lymph node in the LEFT pelvis; but no distant nodal metastasis or visceral metastasis  #Patient currently on Xtandi 80 mg a day [secondary to fatigue]; Eligard-continue the  same.  Discussed multiple times regarding initiation of Taxotere in progression of disease-however reluctant.  Wants to try Taxotere after coming from [place] in [place]  # Right elbow-for the last 4 to 5 weeks taking tramadol as needed.  Unclear etiology.  X-rays no acute process.  Recommend a bone scan for further evaluation.  High clinical concern for metastatic disease.  Also recommend evaluation with orthopedics/await PCP evaluation on 5/15.   #Bone metastases/ metastses-currently s/p  radiation to left hip/ lumbar area. zometa on 2/24-however hypocalcemia; today calcium is 8.7 -pk to proceed with  Zometa today.  See above.  # Anemia-iron deficiency-hemoglobin ~11-12- STABLE.  on PO iron.   # DM-type II- on metformin-STABLE [Dr.Solum].  BG-277 post BF-need to monitor closely on chemotherapy/steroids.   * eliagard- next AUG mid 2023.   # DISPOSITION:  # bone scan ASAP # Zometa today.  # follow up in 1 month- MD; labs- cbc/cmp;PSA- Dr.B       Orders Placed This Encounter  Procedures   NM Bone Scan Whole Body    Standing Status:   Future    Standing Expiration Date:   07/02/2022    Order Specific Question:   If indicated for the ordered procedure, I authorize the administration of a radiopharmaceutical per Radiology protocol    Answer:   Yes    Order Specific Question:   Preferred imaging location?    Answer:   [facility]    Order Specific Question:   Radiology Contrast Protocol - do NOT remove file path    Answer:   [email or web address]   CBC  with Differential/Platelet    Standing Status:   Future    Standing Expiration Date:   07/02/2022   Comprehensive metabolic panel    Standing Status:   Future    Standing Expiration Date:   07/02/2022   PSA    Standing Status:   Future    Standing Expiration Date:   07/02/2022    All questions were answered. The patient knows to call the clinic with any problems, questions or  concerns.      [name], MD 07/01/2021 5:06 PM

## 2021-07-04 DIAGNOSIS — Z0000 Encounter for general adult medical examination without abnormal findings: Secondary | ICD-10-CM | POA: Diagnosis not present

## 2021-07-04 DIAGNOSIS — E119 Type 2 diabetes mellitus without complications: Secondary | ICD-10-CM | POA: Diagnosis not present

## 2021-07-04 DIAGNOSIS — Z1389 Encounter for screening for other disorder: Secondary | ICD-10-CM | POA: Diagnosis not present

## 2021-07-04 DIAGNOSIS — I10 Essential (primary) hypertension: Secondary | ICD-10-CM | POA: Diagnosis not present

## 2021-07-04 DIAGNOSIS — M79601 Pain in right arm: Secondary | ICD-10-CM | POA: Diagnosis not present

## 2021-07-04 DIAGNOSIS — C61 Malignant neoplasm of prostate: Secondary | ICD-10-CM | POA: Diagnosis not present

## 2021-07-06 ENCOUNTER — Ambulatory Visit
Admission: RE | Admit: 2021-07-06 | Discharge: 2021-07-06 | Disposition: A | Discharge status: Home / Self Care | Payer: Medicare HMO | Source: Ambulatory Visit | Attending: Internal Medicine | Admitting: Internal Medicine

## 2021-07-06 ENCOUNTER — Encounter
Admission: RE | Admit: 2021-07-06 | Discharge: 2021-07-06 | Disposition: A | Discharge status: Home / Self Care | Payer: Medicare HMO | Source: Ambulatory Visit | Attending: Internal Medicine | Admitting: Internal Medicine

## 2021-07-06 DIAGNOSIS — C61 Malignant neoplasm of prostate: Secondary | ICD-10-CM | POA: Diagnosis not present

## 2021-07-06 DIAGNOSIS — C7951 Secondary malignant neoplasm of bone: Secondary | ICD-10-CM | POA: Diagnosis not present

## 2021-07-06 MED ORDER — TECHNETIUM TC 99M MEDRONATE IV KIT
20.0000 | PACK | Freq: Once | INTRAVENOUS | Status: AC | PRN
  Administered 2021-07-06: 23.55 via INTRAVENOUS

## 2021-07-11 ENCOUNTER — Encounter: Payer: Self-pay | Admitting: Internal Medicine

## 2021-07-11 ENCOUNTER — Other Ambulatory Visit: Payer: Self-pay | Admitting: Internal Medicine

## 2021-07-11 ENCOUNTER — Other Ambulatory Visit (HOSPITAL_COMMUNITY): Payer: Self-pay

## 2021-07-11 DIAGNOSIS — C61 Malignant neoplasm of prostate: Secondary | ICD-10-CM

## 2021-07-11 NOTE — Progress Notes (Signed)
Unable to reach the patient daughter. I left a voicemail that he will need an MRI of the neck to further evaluate the findings noted on bone scan.  GB

## 2021-07-12 ENCOUNTER — Encounter: Payer: Self-pay | Admitting: Internal Medicine

## 2021-07-12 ENCOUNTER — Other Ambulatory Visit (HOSPITAL_COMMUNITY): Payer: Self-pay

## 2021-07-12 MED ORDER — ENZALUTAMIDE 40 MG PO TABS
80.0000 mg | ORAL_TABLET | Freq: Every day | ORAL | 3 refills | Status: DC
  Filled 2021-07-12: qty 60, 30d supply, fill #0
  Filled 2021-08-02: qty 60, 30d supply, fill #1
  Filled 2021-08-17: qty 60, 30d supply, fill #2
  Filled 2021-09-30: qty 60, 30d supply, fill #3

## 2021-07-12 NOTE — Telephone Encounter (Signed)
PSA Order: [id number] Status: Final result    Visible to patient: Yes (not seen)    Next appt: 08/01/2021 at 09:45 AM in Oncology ([facility])    Dx: Prostate cancer ([place])    0 Result Notes           Component Ref Range & Units 11 d ago 1 mo ago 3 mo ago 6 mo ago 8 mo ago 10 mo ago 1 yr ago  Prostatic Specific Antigen 0.00 - 4.00 ng/mL 0.42  0.47 CM  0.33 CM  0.16 CM  0.11 CM  0.07 CM  0.08     CBC w/auto Differential (5 Part) Order: [id number]  Ref Range & Units 3 wk ago  WBC (White Blood Cell Count) 4.1 - 10.2 10^3/uL 7.1   RBC (Red Blood Cell Count) 4.69 - 6.13 10^6/uL 3.91 Low    Hemoglobin 14.1 - 18.1 gm/dL 12.0 Low    Hematocrit 40.0 - 52.0 % 36.5 Low    MCV (Mean Corpuscular Volume) 80.0 - 100.0 fl 93.4   MCH (Mean Corpuscular Hemoglobin) 27.0 - 31.2 pg 30.7   MCHC (Mean Corpuscular Hemoglobin Concentration) 32.0 - 36.0 gm/dL 32.9   Platelet Count 150 - 450 10^3/uL 346   RDW-CV (Red Cell Distribution Width) 11.6 - 14.8 % 13.5   MPV (Mean Platelet Volume) 9.4 - 12.4 fl 9.2 Low    Neutrophils 1.50 - 7.80 10^3/uL 5.10   Lymphocytes 1.00 - 3.60 10^3/uL 1.19   Monocytes 0.00 - 1.50 10^3/uL 0.71   Eosinophils 0.00 - 0.55 10^3/uL 0.05   Basophils 0.00 - 0.09 10^3/uL 0.04   Neutrophil % 32.0 - 70.0 % 71.4 High    Lymphocyte % 10.0 - 50.0 % 16.7   Monocyte % 4.0 - 13.0 % 10.0   Eosinophil % 1.0 - 5.0 % 0.7 Low    Basophil% 0.0 - 2.0 % 0.6   Immature Granulocyte % <=0.7 % 0.6   Immature Granulocyte Count <=0.06 10^3/L 0.04   Resulting Agency  [facility]  Specimen Collected: 06/20/21 07:47 Last Resulted: 06/20/21 08:07  Received From: [facility]  Result Received: 06/30/21 15:23  Comprehensive Metabolic Panel (CMP) Order: [id number]  Ref Range & Units 3 wk ago  Glucose 70 - 110 mg/dL 295 High    Sodium 136 - 145 mmol/L 132 Low    Potassium 3.6 - 5.1 mmol/L 5.5 High    Chloride 97 - 109 mmol/L 99   Carbon Dioxide (CO2) 22.0 - 32.0 mmol/L 26.2    Urea Nitrogen (BUN) 7 - 25 mg/dL 22   Creatinine 0.7 - 1.3 mg/dL 1.0   Glomerular Filtration Rate (eGFR), MDRD Estimate >60 mL/min/1.73sq m 71   Calcium 8.7 - 10.3 mg/dL 9.3   AST  8 - 39 U/L 18   ALT  6 - 57 U/L 13   Alk Phos (alkaline Phosphatase) 34 - 104 U/L 72   Albumin 3.5 - 4.8 g/dL 4.0   Bilirubin, Total 0.3 - 1.2 mg/dL 0.4   Protein, Total 6.1 - 7.9 g/dL 7.4   A/G Ratio 1.0 - 5.0 gm/dL 1.2   Resulting Agency  [facility]  Specimen Collected: 06/20/21 07:47 Last Resulted: 06/20/21 12:24  Received From: [facility]  Result Received: 06/30/21 15:23

## 2021-07-13 ENCOUNTER — Other Ambulatory Visit: Payer: Self-pay

## 2021-07-13 ENCOUNTER — Telehealth: Payer: Self-pay | Admitting: Internal Medicine

## 2021-07-13 ENCOUNTER — Encounter: Payer: Self-pay | Admitting: Internal Medicine

## 2021-07-13 DIAGNOSIS — C61 Malignant neoplasm of prostate: Secondary | ICD-10-CM

## 2021-07-13 NOTE — Telephone Encounter (Signed)
Referral placed.

## 2021-07-13 NOTE — Telephone Encounter (Signed)
Spoke with daughter; will order MRI ASAP.    Please schedule appointment with Dr. Donella Stade re: prostate cancer/cervical spine mets.  Thanks GB

## 2021-07-15 ENCOUNTER — Telehealth: Payer: Self-pay

## 2021-07-15 NOTE — Telephone Encounter (Signed)
Nutrition Follow-up:   Patient with metastatic stage IV prostate cancer.  Recent bone scan, planning MRI and radiation consultation.  Spoke with daughter by phone for follow-up.  Reports that patient is eating a little bit more solid food.  Drinking 1 shake because thinks more will increase blood glucose (premier protein).      Medications: reviewed  Labs: reviewed  Anthropometrics:   Weight 126 lb stable 125 lb on 4/14  NUTRITION DIAGNOSIS: Inadequate oral intake stable    INTERVENTION:  Continue high calorie, high protein foods as able Continue low sugar shakes as able Daughter to reach out to RD if needed in the future     NEXT VISIT: no follow-up RD available as needed  Michael [name] B. Michael [name], [place], [place] Registered Dietitian [phone]

## 2021-07-16 ENCOUNTER — Ambulatory Visit
Admission: RE | Admit: 2021-07-16 | Discharge: 2021-07-16 | Disposition: A | Discharge status: Home / Self Care | Payer: Medicare HMO | Source: Ambulatory Visit | Attending: Internal Medicine | Admitting: Internal Medicine

## 2021-07-16 DIAGNOSIS — M549 Dorsalgia, unspecified: Secondary | ICD-10-CM | POA: Diagnosis not present

## 2021-07-16 DIAGNOSIS — M542 Cervicalgia: Secondary | ICD-10-CM | POA: Diagnosis not present

## 2021-07-16 DIAGNOSIS — C61 Malignant neoplasm of prostate: Secondary | ICD-10-CM | POA: Diagnosis not present

## 2021-07-16 MED ORDER — GADOBUTROL 1 MMOL/ML IV SOLN
5.0000 mL | Freq: Once | INTRAVENOUS | Status: AC | PRN
  Administered 2021-07-16: 7.5 mL via INTRAVENOUS

## 2021-07-19 ENCOUNTER — Encounter: Payer: Self-pay | Admitting: Internal Medicine

## 2021-07-19 NOTE — Progress Notes (Signed)
I spoke with pt daughter- explained the concerning findings on the MRI. I'm waiting evaluation with radiation on June 1. Is this my concerns of traveling to [place] at this time. However, patient still reluctant with chemotherapy. Will discuss further at next visit.   I have informed Dr. Donella Stade of the MRI findings.   GB

## 2021-07-21 ENCOUNTER — Encounter: Payer: Self-pay | Admitting: Radiation Oncology

## 2021-07-21 ENCOUNTER — Ambulatory Visit
Admission: RE | Admit: 2021-07-21 | Discharge: 2021-07-21 | Disposition: A | Discharge status: Home / Self Care | Payer: Medicare HMO | Source: Ambulatory Visit | Attending: Radiation Oncology | Admitting: Radiation Oncology

## 2021-07-21 VITALS — BP 135/80 | HR 83 | Temp 98.3°F | Resp 16 | Wt 124.2 lb

## 2021-07-21 DIAGNOSIS — C7951 Secondary malignant neoplasm of bone: Secondary | ICD-10-CM | POA: Insufficient documentation

## 2021-07-21 DIAGNOSIS — C61 Malignant neoplasm of prostate: Secondary | ICD-10-CM | POA: Insufficient documentation

## 2021-07-21 DIAGNOSIS — Z08 Encounter for follow-up examination after completed treatment for malignant neoplasm: Secondary | ICD-10-CM | POA: Diagnosis not present

## 2021-07-21 DIAGNOSIS — M79601 Pain in right arm: Secondary | ICD-10-CM | POA: Insufficient documentation

## 2021-07-21 NOTE — Progress Notes (Signed)
Radiation Oncology Follow up Note old patient new area spinal mets  Name: Michael [name]   Date:   07/21/2021 MRN:  [id number] DOB: [birth year]    This 88 y.o. male presents to the clinic today for evaluation of lower cervical upper thoracic spinal mets in patient with known stage IV prostate cancer.  REFERRING PROVIDER: [name], MD  HPI: Patient is an 88 year old male well-known to apartment previously received radiation therapy to his left acetabulum as well as SI joint for involvement of metastatic static stage IV prostate cancer.  He had some initial significant bowel toxicity from radiation although that is cleared and his pain has markedly improved and at region.  He has recently been noted on MRI of his cervical thoracic spine.  Showing metastatic disease from C7-T4 with evidence of posterior epidural tumor negative for cord compression.  He is having no significant pain although he is having some pain in his right upper extremity which I anticipate is referred pain from his spinal metastasis.  No other significant pain at this time.  COMPLICATIONS OF TREATMENT: none  FOLLOW UP COMPLIANCE: keeps appointments   PHYSICAL EXAM:  BP 135/80 (BP Location: Left Arm, Patient Position: Sitting, Cuff Size: Small)   Pulse 83   Temp 98.3 F (36.8 C) (Tympanic)   Resp 16   Wt 124 lb 3.2 oz (56.3 kg)   BMI 20.05 kg/m  Motor or sensory and DTR levels are equal and symmetric in the upper lower extremities.  Well-developed well-nourished patient in NAD. HEENT reveals PERLA, EOMI, discs not visualized.  Oral cavity is clear. No oral mucosal lesions are identified. Neck is clear without evidence of cervical or supraclavicular adenopathy. Lungs are clear to A&P. Cardiac examination is essentially unremarkable with regular rate and rhythm without murmur rub or thrill. Abdomen is benign with no organomegaly or masses noted. Motor sensory and DTR levels are equal and symmetric in the upper and  lower extremities. Cranial nerves II through XII are grossly intact. Proprioception is intact. No peripheral adenopathy or edema is identified. No motor or sensory levels are noted. Crude visual fields are within normal range.  RADIOLOGY RESULTS: MRI scans bone scans are all reviewed compatible with above-stated findings  PLAN: This time elect go ahead with palliative radiation therapy from C7-T10 inclusive we will try to spare some of the unaffected vertebral bodies and go with a hypofractionated dose of 30 Gray in 15 fractions to cut down on toxicity which patient experienced initially to his small bowel and colon.  Risks and benefits of treatment including radiation esophagitis skin reaction fatigue all were discussed in detail.  Patient comprehends my recommendation well.  I have personally set up and ordered CT simulation for next week.  I would like to take this opportunity to thank you for allowing me to participate in the care of your patient...    Noreene Filbert, MD

## 2021-07-22 ENCOUNTER — Ambulatory Visit: Payer: Medicare HMO

## 2021-07-26 ENCOUNTER — Ambulatory Visit
Admission: RE | Admit: 2021-07-26 | Discharge: 2021-07-26 | Disposition: A | Discharge status: Home / Self Care | Payer: Medicare HMO | Source: Ambulatory Visit | Attending: Radiation Oncology | Admitting: Radiation Oncology

## 2021-07-26 DIAGNOSIS — M79601 Pain in right arm: Secondary | ICD-10-CM | POA: Insufficient documentation

## 2021-07-26 DIAGNOSIS — E1165 Type 2 diabetes mellitus with hyperglycemia: Secondary | ICD-10-CM | POA: Insufficient documentation

## 2021-07-26 DIAGNOSIS — C7951 Secondary malignant neoplasm of bone: Secondary | ICD-10-CM | POA: Diagnosis not present

## 2021-07-26 DIAGNOSIS — Z191 Hormone sensitive malignancy status: Secondary | ICD-10-CM | POA: Diagnosis not present

## 2021-07-26 DIAGNOSIS — Z79899 Other long term (current) drug therapy: Secondary | ICD-10-CM | POA: Diagnosis not present

## 2021-07-26 DIAGNOSIS — Z923 Personal history of irradiation: Secondary | ICD-10-CM | POA: Diagnosis not present

## 2021-07-26 DIAGNOSIS — D509 Iron deficiency anemia, unspecified: Secondary | ICD-10-CM | POA: Insufficient documentation

## 2021-07-26 DIAGNOSIS — Z510 Encounter for antineoplastic radiation therapy: Secondary | ICD-10-CM | POA: Insufficient documentation

## 2021-07-26 DIAGNOSIS — C61 Malignant neoplasm of prostate: Secondary | ICD-10-CM | POA: Insufficient documentation

## 2021-07-26 DIAGNOSIS — Z794 Long term (current) use of insulin: Secondary | ICD-10-CM | POA: Diagnosis not present

## 2021-07-27 DIAGNOSIS — Z510 Encounter for antineoplastic radiation therapy: Secondary | ICD-10-CM | POA: Diagnosis not present

## 2021-07-27 DIAGNOSIS — C61 Malignant neoplasm of prostate: Secondary | ICD-10-CM | POA: Diagnosis not present

## 2021-07-27 DIAGNOSIS — E1165 Type 2 diabetes mellitus with hyperglycemia: Secondary | ICD-10-CM | POA: Diagnosis not present

## 2021-07-27 DIAGNOSIS — Z923 Personal history of irradiation: Secondary | ICD-10-CM | POA: Diagnosis not present

## 2021-07-27 DIAGNOSIS — M79601 Pain in right arm: Secondary | ICD-10-CM | POA: Diagnosis not present

## 2021-07-27 DIAGNOSIS — Z191 Hormone sensitive malignancy status: Secondary | ICD-10-CM | POA: Diagnosis not present

## 2021-07-27 DIAGNOSIS — Z794 Long term (current) use of insulin: Secondary | ICD-10-CM | POA: Diagnosis not present

## 2021-07-27 DIAGNOSIS — Z79899 Other long term (current) drug therapy: Secondary | ICD-10-CM | POA: Diagnosis not present

## 2021-07-27 DIAGNOSIS — C7951 Secondary malignant neoplasm of bone: Secondary | ICD-10-CM | POA: Diagnosis not present

## 2021-07-28 ENCOUNTER — Ambulatory Visit: Admission: RE | Admit: 2021-07-28 | Payer: Medicare HMO | Source: Ambulatory Visit

## 2021-07-28 DIAGNOSIS — Z510 Encounter for antineoplastic radiation therapy: Secondary | ICD-10-CM | POA: Diagnosis not present

## 2021-07-28 DIAGNOSIS — Z923 Personal history of irradiation: Secondary | ICD-10-CM | POA: Diagnosis not present

## 2021-07-28 DIAGNOSIS — Z794 Long term (current) use of insulin: Secondary | ICD-10-CM | POA: Diagnosis not present

## 2021-07-28 DIAGNOSIS — Z191 Hormone sensitive malignancy status: Secondary | ICD-10-CM | POA: Diagnosis not present

## 2021-07-28 DIAGNOSIS — E1165 Type 2 diabetes mellitus with hyperglycemia: Secondary | ICD-10-CM | POA: Diagnosis not present

## 2021-07-28 DIAGNOSIS — C61 Malignant neoplasm of prostate: Secondary | ICD-10-CM | POA: Diagnosis not present

## 2021-07-28 DIAGNOSIS — C7951 Secondary malignant neoplasm of bone: Secondary | ICD-10-CM | POA: Diagnosis not present

## 2021-07-28 DIAGNOSIS — M79601 Pain in right arm: Secondary | ICD-10-CM | POA: Diagnosis not present

## 2021-07-28 DIAGNOSIS — Z79899 Other long term (current) drug therapy: Secondary | ICD-10-CM | POA: Diagnosis not present

## 2021-08-01 ENCOUNTER — Inpatient Hospital Stay: Payer: Medicare HMO

## 2021-08-01 ENCOUNTER — Ambulatory Visit
Admission: RE | Admit: 2021-08-01 | Discharge: 2021-08-01 | Disposition: A | Discharge status: Home / Self Care | Payer: Medicare HMO | Source: Ambulatory Visit | Attending: Radiation Oncology | Admitting: Radiation Oncology

## 2021-08-01 ENCOUNTER — Encounter: Payer: Self-pay | Admitting: Internal Medicine

## 2021-08-01 ENCOUNTER — Other Ambulatory Visit: Payer: Self-pay

## 2021-08-01 ENCOUNTER — Inpatient Hospital Stay (HOSPITAL_BASED_OUTPATIENT_CLINIC_OR_DEPARTMENT_OTHER): Payer: Medicare HMO | Admitting: Internal Medicine

## 2021-08-01 ENCOUNTER — Other Ambulatory Visit: Payer: Self-pay | Admitting: Urology

## 2021-08-01 DIAGNOSIS — D509 Iron deficiency anemia, unspecified: Secondary | ICD-10-CM | POA: Insufficient documentation

## 2021-08-01 DIAGNOSIS — Z794 Long term (current) use of insulin: Secondary | ICD-10-CM | POA: Insufficient documentation

## 2021-08-01 DIAGNOSIS — C61 Malignant neoplasm of prostate: Secondary | ICD-10-CM

## 2021-08-01 DIAGNOSIS — E1165 Type 2 diabetes mellitus with hyperglycemia: Secondary | ICD-10-CM | POA: Insufficient documentation

## 2021-08-01 DIAGNOSIS — Z79899 Other long term (current) drug therapy: Secondary | ICD-10-CM | POA: Insufficient documentation

## 2021-08-01 DIAGNOSIS — M79601 Pain in right arm: Secondary | ICD-10-CM | POA: Insufficient documentation

## 2021-08-01 DIAGNOSIS — Z923 Personal history of irradiation: Secondary | ICD-10-CM | POA: Diagnosis not present

## 2021-08-01 DIAGNOSIS — Z191 Hormone sensitive malignancy status: Secondary | ICD-10-CM | POA: Insufficient documentation

## 2021-08-01 DIAGNOSIS — Z510 Encounter for antineoplastic radiation therapy: Secondary | ICD-10-CM | POA: Diagnosis not present

## 2021-08-01 DIAGNOSIS — C7951 Secondary malignant neoplasm of bone: Secondary | ICD-10-CM | POA: Insufficient documentation

## 2021-08-01 DIAGNOSIS — R339 Retention of urine, unspecified: Secondary | ICD-10-CM

## 2021-08-01 LAB — COMPREHENSIVE METABOLIC PANEL
ALT: 12 U/L (ref 0–44)
AST: 21 U/L (ref 15–41)
Albumin: 3.5 g/dL (ref 3.5–5.0)
Alkaline Phosphatase: 56 U/L (ref 38–126)
Anion gap: 6 (ref 5–15)
BUN: 17 mg/dL (ref 8–23)
CO2: 26 mmol/L (ref 22–32)
Calcium: 8.9 mg/dL (ref 8.9–10.3)
Chloride: 103 mmol/L (ref 98–111)
Creatinine, Ser: 1.06 mg/dL (ref 0.61–1.24)
GFR, Estimated: >60 mL/min (ref >60)
Glucose, Bld: 147 mg/dL — ABNORMAL HIGH (ref 70–99)
Potassium: 4.9 mmol/L (ref 3.5–5.1)
Sodium: 135 mmol/L (ref 135–145)
Total Bilirubin: 0.4 mg/dL (ref 0.3–1.2)
Total Protein: 7.6 g/dL (ref 6.5–8.1)

## 2021-08-01 LAB — RAD ONC ARIA SESSION SUMMARY
Course Elapsed Days: 0
Plan Fractions Treated to Date: 1
Plan Prescribed Dose Per Fraction: 2 Gy
Plan Total Fractions Prescribed: 15
Plan Total Prescribed Dose: 30 Gy
Reference Point Dosage Given to Date: 2 Gy
Reference Point Session Dosage Given: 2 Gy
Session Number: 1

## 2021-08-01 LAB — CBC WITH DIFFERENTIAL/PLATELET
Abs Immature Granulocytes: 0.05 10*3/uL (ref 0.00–0.07)
Basophils Absolute: 0 10*3/uL (ref 0.0–0.1)
Basophils Relative: 1 %
Eosinophils Absolute: 0.1 10*3/uL (ref 0.0–0.5)
Eosinophils Relative: 1 %
HCT: 35.1 % — ABNORMAL LOW (ref 39.0–52.0)
Hemoglobin: 11.4 g/dL — ABNORMAL LOW (ref 13.0–17.0)
Immature Granulocytes: 1 %
Lymphocytes Relative: 17 %
Lymphs Abs: 1 10*3/uL (ref 0.7–4.0)
MCH: 30.5 pg (ref 26.0–34.0)
MCHC: 32.5 g/dL (ref 30.0–36.0)
MCV: 93.9 fL (ref 80.0–100.0)
Monocytes Absolute: 0.8 10*3/uL (ref 0.1–1.0)
Monocytes Relative: 13 %
Neutro Abs: 4 10*3/uL (ref 1.7–7.7)
Neutrophils Relative %: 67 %
Platelets: 242 10*3/uL (ref 150–400)
RBC: 3.74 MIL/uL — ABNORMAL LOW (ref 4.22–5.81)
RDW: 13.1 % (ref 11.5–15.5)
WBC: 6 10*3/uL (ref 4.0–10.5)
nRBC: 0 % (ref 0.0–0.2)

## 2021-08-01 LAB — PSA: Prostatic Specific Antigen: 0.34 ng/mL (ref 0.00–4.00)

## 2021-08-01 MED ORDER — SUCRALFATE 1 G PO TABS: 1.0000 g | ORAL_TABLET | Freq: Three times a day (TID) | ORAL | 0 refills | Status: DC

## 2021-08-01 MED ORDER — TRAMADOL HCL 50 MG PO TABS: 50.0000 mg | ORAL_TABLET | Freq: Three times a day (TID) | ORAL | 0 refills | Status: DC | PRN

## 2021-08-01 NOTE — Progress Notes (Signed)
[facility] OFFICE PROGRESS NOTE  Patient Care Team: [name], MD as PCP - General (Internal Medicine) Wellington [name], MD as PCP - Cardiology (Cardiology) [name], MD as Consulting Physician (Oncology)   Cancer Staging  No matching staging information was found for the patient.   Oncology History Overview Note  # FEB 2019-Metastatic prostate cancer/ castrate sensitive [bulky retroperitoneal adenopathy; invasion into the bladder; rectum] Bladder Bx- prostate. On Degarelix  # April 3rd 2019- [name] [138md];  Lupron q 6 M [[id number]]; MID AUG 2019- reduced dose to 863mday  #February 2023 bone scan PET scan progression of disease -bone metastases/ metastses-on RT to left hip/ lumbar area.-Complicated by multiple enteritis.  #May 2023 -right arm pain-secondary to tumor compression-MRI spine C7-T10 -status post evaluation with Dr. [name] [facility] 30 [facility]   # Poorly controlled DM-on insulin  # s/p right PCN [explanted]/ Foley cath [Dr.Brandon]; multiple UTIs. [s/p ID; Dr.Ravishankar]  ----------------------------------------------------------------- DIAGNOSIS: [ FEB 2019] MET. PROSTATE CA  STAGE:   IV ;GOALS: PALLIATIVE  CURRENT/MOST RECENT THERAPY [ Feb-April2019] Lupron+ [name]    Prostate cancer ([facility] 05/13/2021 -  Chemotherapy   Patient is on Treatment Plan : Prostate cancer- Docetaxel q weekly         INTERVAL HISTORY: Ambulating independently.  Accompanied by his daughter.  GoMCKALE [id number].  male pleasant patient above history of castrate resistant metastatic prostate cancer currently on Xtandi/ELigard is here for follow-up/MRI cervical/thoracic spine.   In the interim patient was evaluated by radiation oncology.  He is awaiting to start radiation-4 C7-T4 metastatic disease.  Continues to have intermittent pain of his right arm.  [place]  Review of Systems  Constitutional:  Positive for malaise/fatigue. Negative for chills, diaphoresis, fever and weight loss.  HENT:  Negative for nosebleeds and sore throat.   Eyes:  Negative for double vision.  Respiratory:  Negative for cough, hemoptysis, sputum production and wheezing.   Cardiovascular:  Negative for chest pain, palpitations, orthopnea and leg swelling.  Gastrointestinal:  Negative for abdominal pain, blood in stool, constipation, diarrhea, heartburn, melena, nausea and vomiting.  Musculoskeletal:  Positive for back pain and joint pain.  Skin: Negative.  Negative for itching and rash.  Neurological:  Negative for dizziness, tingling, focal weakness, weakness and headaches.  Endo/Heme/Allergies:  Does not bruise/bleed easily.  Psychiatric/Behavioral:  Negative for depression. The patient is not nervous/anxious and does not have insomnia.       PAST MEDICAL HISTORY :  Past Medical History:  Diagnosis Date   Anemia    iron deficiency   Aortic atherosclerosis (HCC)    Bilateral carotid artery disease (HCC)    Mild plaque formation without obstructive disease noted on carotid Doppler.   Diabetes mellitus without complication ([facility]   Essential hypertension    GERD (gastroesophageal reflux disease)    Hyperlipidemia    Hypertension    Left carotid artery stenosis    Nonobstructive Coronary artery disease    a. [facility] The patient was told about 2 blockages which did not require revascularization; b. 01/2015 Cath: LM nl, LAD 10p/m, 4550m D2 30ost, LCX nl, RCA min irregs, EF 55-65%; b. 12/2018 MV: No ischemia/infarct. CT images w/ mod 3 vessel Cor Ca2+ and mild-mod Ao atherosclerosis.   Prostate cancer ([facility]CCLozano2/2019   Raynaud disease    a. Managed w/ nifedipine.  PAST SURGICAL HISTORY :   Past Surgical History:  Procedure Laterality Date   CARDIAC CATHETERIZATION  2010   CARDIAC CATHETERIZATION N/A  02/03/2015   Procedure: Left Heart Cath and Coronary Angiography;  Surgeon: Wellington [name], MD;  Location: [facility];  Service: Cardiovascular;  Laterality: N/A;   CATARACT EXTRACTION Bilateral    CYSTOSCOPY W/ RETROGRADES Bilateral 04/11/2017   Procedure: CYSTOSCOPY WITH RETROGRADE PYELOGRAM;  Surgeon: [name], MD;  Location: [facility];  Service: Urology;  Laterality: Bilateral;   CYSTOSCOPY W/ RETROGRADES Bilateral 09/12/2017   Procedure: CYSTOSCOPY WITH RETROGRADE PYELOGRAM;  Surgeon: [name], MD;  Location: [facility];  Service: Urology;  Laterality: Bilateral;   CYSTOSCOPY W/ RETROGRADES Bilateral 02/25/2018   Procedure: CYSTOSCOPY WITH RETROGRADE PYELOGRAM;  Surgeon: [name], MD;  Location: [facility];  Service: Urology;  Laterality: Bilateral;   CYSTOSCOPY W/ URETERAL STENT PLACEMENT Bilateral 09/12/2017   Procedure: CYSTOSCOPY WITH STENT REPLACEMENT;  Surgeon: [name], MD;  Location: [facility];  Service: Urology;  Laterality: Bilateral;   CYSTOSCOPY W/ URETERAL STENT REMOVAL Bilateral 02/25/2018   Procedure: CYSTOSCOPY WITH STENT REMOVAL;  Surgeon: [name], MD;  Location: [facility];  Service: Urology;  Laterality: Bilateral;   CYSTOSCOPY WITH STENT PLACEMENT Left 04/11/2017   Procedure: CYSTOSCOPY WITH STENT PLACEMENT and fulgeration;  Surgeon: [name], MD;  Location: [facility];  Service: Urology;  Laterality: Left;   CYSTOSCOPY WITH URETHRAL DILATATION Bilateral 04/11/2017   Procedure: CYSTOSCOPY WITH URETHRAL DILATATION;  Surgeon: [name], MD;  Location: [facility];  Service: Urology;  Laterality: Bilateral;   IR CONVERT RIGHT NEPHROSTOMY TO NEPHROURETERAL CATH  05/21/2017   IR NEPHROSTOMY PLACEMENT RIGHT  04/13/2017    FAMILY HISTORY :   Family History  Problem Relation Age of Onset   Hypertension Father     SOCIAL HISTORY:   Social History   Tobacco Use   Smoking status: Never   Smokeless tobacco: Never  Vaping Use   Vaping  Use: Never used  Substance Use Topics   Alcohol use: No   Drug use: No    ALLERGIES:  has No Known Allergies.  MEDICATIONS:  Current Outpatient Medications  Medication Sig Dispense Refill   acetaminophen (TYLENOL) 500 MG tablet Take 500 mg by mouth every 4 (four) hours as needed for moderate pain or fever.     albuterol (PROVENTIL HFA;VENTOLIN HFA) 108 (90 Base) MCG/ACT inhaler Inhale 2 puffs into the lungs every 4 (four) hours as needed for wheezing or shortness of breath.     atorvastatin (LIPITOR) 10 MG tablet Take 10 mg by mouth daily at 6 PM.      Calcium Carb-Cholecalciferol (CALCIUM 600+D3) 600-800 MG-UNIT TABS Take 1 tablet by mouth every evening.      enzalutamide (XTANDI) 40 MG tablet Take 2 tablets (80 mg total) by mouth daily. 60 tablet 3   glimepiride (AMARYL) 2 MG tablet Take 2 mg by mouth daily with breakfast.     glucose blood (ACCU-CHEK GUIDE) test strip Use asdirected three times a day diag E11.65 100 each 3   ibuprofen (ADVIL,MOTRIN) 200 MG tablet Take 200 mg by mouth every 6 (six) hours as needed for fever or moderate pain.      iron polysaccharides (NIFEREX) 150 MG capsule Take 150 mg by mouth daily.      lisinopril (ZESTRIL) 2.5 MG tablet Take 2.5 mg by mouth daily.     metFORMIN (GLUCOPHAGE) 1000 MG tablet Take 1,000 mg by mouth 2 (two) times daily.  metoprolol succinate (TOPROL-XL) 25 MG 24 hr tablet TAKE 1 TABLET BY MOUTH EVERY DAY FOR BLOOD PRESSURE 90 tablet 3   Multiple Vitamins-Minerals (MULTIVITAMIN WITH MINERALS) tablet Take 1 tablet by mouth daily.      NIFEdipine (ADALAT CC) 30 MG 24 hr tablet TAKE 1 TABLET BY MOUTH EVERY DAY 90 tablet 2   omeprazole (PRILOSEC) 20 MG capsule TAKE ONE CAPSULE BY MOUTH EVERY DAY 90 capsule 2   sitaGLIPtin (JANUVIA) 100 MG tablet Take 100 mg by mouth daily.     sucralfate (CARAFATE) 1 g tablet Take 1 tablet (1 g total) by mouth 4 (four) times daily -  with meals and at bedtime. 90 tablet 0   COVID-19 mRNA Vac-TriS, Pfizer,  (PFIZER-BIONT COVID-19 VAC-TRIS) SUSP injection Inject into the muscle. (Patient not taking: Reported on 08/01/2021) 0.3 mL 0   dexamethasone (DECADRON) 2 MG tablet Take 1 tablet (2 mg total) by mouth 2 (two) times daily with a meal. (Patient not taking: Reported on 05/30/2021) 14 tablet 0   MYRBETRIQ 25 MG TB24 tablet TAKE 1 TABLET (25 MG TOTAL) BY MOUTH DAILY. 90 tablet 3   traMADol (ULTRAM) 50 MG tablet Take 1 tablet (50 mg total) by mouth every 8 (eight) hours as needed. 90 tablet 0   No current facility-administered medications for this visit.    PHYSICAL EXAMINATION: ECOG PERFORMANCE STATUS: 1 - Symptomatic but completely ambulatory  BP 122/78   Pulse 70   Temp (!) 96.6 F (35.9 C)   Resp 16   Ht 5' 6" (1.676 m)   Wt 124 lb 9.6 oz (56.5 kg)   BMI 20.11 kg/m   Filed Weights   08/01/21 1032  Weight: 124 lb 9.6 oz (56.5 kg)    Physical Exam Constitutional:      Comments: Accompanied by his daughter.  Walking himself.  HENT:     Head: Normocephalic and atraumatic.     Mouth/Throat:     Pharynx: No oropharyngeal exudate.  Eyes:     Pupils: Pupils are equal, round, and reactive to light.  Cardiovascular:     Rate and Rhythm: Normal rate and regular rhythm.  Pulmonary:     Effort: Pulmonary effort is normal. No respiratory distress.     Breath sounds: Normal breath sounds. No wheezing.  Abdominal:     General: Bowel sounds are normal. There is no distension.     Palpations: Abdomen is soft. There is no mass.     Tenderness: There is no abdominal tenderness. There is no guarding or rebound.  Musculoskeletal:        General: No tenderness. Normal range of motion.     Cervical back: Normal range of motion and neck supple.  Skin:    General: Skin is warm.  Neurological:     Mental Status: He is alert and oriented to person, place, and time.  Psychiatric:        Mood and Affect: Affect normal.      LABORATORY DATA:  I have reviewed the data as listed    Component  Value Date/Time   NA 135 08/01/2021 0947   NA 136 05/07/2012 1328   K 4.9 08/01/2021 0947   K 4.5 05/07/2012 1328   CL 103 08/01/2021 0947   CL 104 05/07/2012 1328   CO2 26 08/01/2021 0947   CO2 28 05/07/2012 1328   GLUCOSE 147 (H) 08/01/2021 0947   GLUCOSE 73 05/07/2012 1328   BUN 17 08/01/2021 0947   BUN 16 05/07/2012  1328   CREATININE 1.06 08/01/2021 0947   CREATININE 0.84 05/07/2012 1328   CALCIUM 8.9 08/01/2021 0947   CALCIUM 8.8 05/07/2012 1328   PROT 7.6 08/01/2021 0947   ALBUMIN 3.5 08/01/2021 0947   AST 21 08/01/2021 0947   ALT 12 08/01/2021 0947   ALKPHOS 56 08/01/2021 0947   BILITOT 0.4 08/01/2021 0947   GFRNONAA >60 08/01/2021 0947   GFRNONAA >60 05/07/2012 1328   GFRAA >60 11/17/2019 0819   GFRAA >60 05/07/2012 1328    No results found for: "SPEP", "UPEP"  Lab Results  Component Value Date   WBC 6.0 08/01/2021   NEUTROABS 4.0 08/01/2021   HGB 11.4 (L) 08/01/2021   HCT 35.1 (L) 08/01/2021   MCV 93.9 08/01/2021   PLT 242 08/01/2021      Chemistry      Component Value Date/Time   NA 135 08/01/2021 0947   NA 136 05/07/2012 1328   K 4.9 08/01/2021 0947   K 4.5 05/07/2012 1328   CL 103 08/01/2021 0947   CL 104 05/07/2012 1328   CO2 26 08/01/2021 0947   CO2 28 05/07/2012 1328   BUN 17 08/01/2021 0947   BUN 16 05/07/2012 1328   CREATININE 1.06 08/01/2021 0947   CREATININE 0.84 05/07/2012 1328      Component Value Date/Time   CALCIUM 8.9 08/01/2021 0947   CALCIUM 8.8 05/07/2012 1328   ALKPHOS 56 08/01/2021 0947   AST 21 08/01/2021 0947   ALT 12 08/01/2021 0947   BILITOT 0.4 08/01/2021 0947       RADIOGRAPHIC STUDIES: I have personally reviewed the radiological images as listed and agreed with the findings in the report. No results found.   ASSESSMENT & PLAN:  Prostate cancer ([place]) # Metastatic prostate cancer/[FEB 2019] castrate RESISTANT; PET scan FEB 2023-  Large lesion in the inferior LEFT iliac bone and L5 vertebral body.  Other areas  include proximal femur -T2 vertebral body.  Multiple other bony areas-involved however no activity.  Positive metastatic lymph node in the LEFT pelvis; but no distant nodal metastasis or visceral metastasis.    # MAY 2023-MRI cervical spine/T-spine epidural tumor causing infiltration  C7-T4  of causing right arm pain-MRI spine.  See discussion below.   #  Currently on Xtandi plus ADT.  PSA slowly rising.  Declines chemotherapy for now.  Consider starting chemotherapy after return from [place].  #Right arm pain-secondary to tumor compression-MRI spine C7-T10 -status post evaluation with Dr. Donella Stade.  Hypofractionated dose of 30 Gray in 15 fractions.  Discussed with Dr. Donella Stade.  Prescription for Carafate given in case of esophagitis.   # Bone metastases/ metastses-currently s/p  radiation to left hip/ lumbar area MARCH 6761 [s/p RT-complicated by small bowel enteritis].  Zometa every 3 months.  # Anemia-iron deficiency-hemoglobin ~11-12- STABLE.  on PO iron.   # DM-type II- on metformin-STABLE [Dr.Solum]. -need to monitor closely on chemotherapy/steroids-144 -STABLE  * eliagard- next AUG mid 2023.   # DISPOSITION:  # follow up in 2 months- MD; labs- cbc/cmp;PSA; Eligard - Dr.B       Orders Placed This Encounter  Procedures   CBC with Differential/Platelet    Standing Status:   Future    Standing Expiration Date:   08/02/2022   Comprehensive metabolic panel    Standing Status:   Future    Standing Expiration Date:   08/02/2022   PSA    Standing Status:   Future    Standing Expiration Date:   08/02/2022  All questions were answered. The patient knows to call the clinic with any problems, questions or concerns.      [name], MD 08/01/2021 10:18 PM

## 2021-08-01 NOTE — Assessment & Plan Note (Addendum)
#   Metastatic prostate cancer/[FEB 2019] castrate RESISTANT; PET scan FEB 2023-  Large lesion in the inferior LEFT iliac bone and L5 vertebral body.  Other areas include proximal femur -T2 vertebral body.  Multiple other bony areas-involved however no activity.  Positive metastatic lymph node in the LEFT pelvis; but no distant nodal metastasis or visceral metastasis.    # MAY 2023-MRI cervical spine/T-spine epidural tumor causing infiltration  C7-T4  of causing right arm pain-MRI spine.  See discussion below.   #  Currently on Xtandi plus ADT.  PSA slowly rising.  Declines chemotherapy for now.  Consider starting chemotherapy after return from [place].  #Right arm pain-secondary to tumor compression-MRI spine C7-T10 -status post evaluation with Dr. Donella Stade.  Hypofractionated dose of 30 Gray in 15 fractions.  Discussed with Dr. Donella Stade.  Prescription for Carafate given in case of esophagitis.   # Bone metastases/ metastses-currently s/p  radiation to left hip/ lumbar area MARCH 2683 [s/p RT-complicated by small bowel enteritis].  Zometa every 3 months.  # Anemia-iron deficiency-hemoglobin ~11-12- STABLE.  on PO iron.   # DM-type II- on metformin-STABLE [Dr.Solum]. -need to monitor closely on chemotherapy/steroids-144 -STABLE  * eliagard- next AUG mid 2023.   # DISPOSITION:  # follow up in 2 months- MD; labs- cbc/cmp;PSA; Eligard - Dr.B

## 2021-08-01 NOTE — Progress Notes (Signed)
Patient has a decrease in appetite with stable wt.

## 2021-08-02 ENCOUNTER — Other Ambulatory Visit (HOSPITAL_COMMUNITY): Payer: Self-pay

## 2021-08-02 ENCOUNTER — Other Ambulatory Visit: Payer: Self-pay

## 2021-08-02 ENCOUNTER — Ambulatory Visit
Admission: RE | Admit: 2021-08-02 | Discharge: 2021-08-02 | Disposition: A | Discharge status: Home / Self Care | Payer: Medicare HMO | Source: Ambulatory Visit | Attending: Radiation Oncology | Admitting: Radiation Oncology

## 2021-08-02 DIAGNOSIS — Z79899 Other long term (current) drug therapy: Secondary | ICD-10-CM | POA: Diagnosis not present

## 2021-08-02 DIAGNOSIS — C7951 Secondary malignant neoplasm of bone: Secondary | ICD-10-CM | POA: Diagnosis not present

## 2021-08-02 DIAGNOSIS — C61 Malignant neoplasm of prostate: Secondary | ICD-10-CM | POA: Diagnosis not present

## 2021-08-02 DIAGNOSIS — E1165 Type 2 diabetes mellitus with hyperglycemia: Secondary | ICD-10-CM | POA: Diagnosis not present

## 2021-08-02 DIAGNOSIS — M79601 Pain in right arm: Secondary | ICD-10-CM | POA: Diagnosis not present

## 2021-08-02 DIAGNOSIS — Z191 Hormone sensitive malignancy status: Secondary | ICD-10-CM | POA: Diagnosis not present

## 2021-08-02 DIAGNOSIS — Z510 Encounter for antineoplastic radiation therapy: Secondary | ICD-10-CM | POA: Diagnosis not present

## 2021-08-02 DIAGNOSIS — Z923 Personal history of irradiation: Secondary | ICD-10-CM | POA: Diagnosis not present

## 2021-08-02 DIAGNOSIS — Z794 Long term (current) use of insulin: Secondary | ICD-10-CM | POA: Diagnosis not present

## 2021-08-02 LAB — RAD ONC ARIA SESSION SUMMARY
Course Elapsed Days: 1
Plan Fractions Treated to Date: 2
Plan Prescribed Dose Per Fraction: 2 Gy
Plan Total Fractions Prescribed: 15
Plan Total Prescribed Dose: 30 Gy
Reference Point Dosage Given to Date: 4 Gy
Reference Point Session Dosage Given: 2 Gy
Session Number: 2

## 2021-08-03 ENCOUNTER — Other Ambulatory Visit: Payer: Self-pay

## 2021-08-03 ENCOUNTER — Inpatient Hospital Stay: Payer: Medicare HMO

## 2021-08-03 ENCOUNTER — Ambulatory Visit
Admission: RE | Admit: 2021-08-03 | Discharge: 2021-08-03 | Disposition: A | Discharge status: Home / Self Care | Payer: Medicare HMO | Source: Ambulatory Visit | Attending: Radiation Oncology | Admitting: Radiation Oncology

## 2021-08-03 DIAGNOSIS — C61 Malignant neoplasm of prostate: Secondary | ICD-10-CM | POA: Diagnosis not present

## 2021-08-03 DIAGNOSIS — Z794 Long term (current) use of insulin: Secondary | ICD-10-CM | POA: Diagnosis not present

## 2021-08-03 DIAGNOSIS — Z923 Personal history of irradiation: Secondary | ICD-10-CM | POA: Diagnosis not present

## 2021-08-03 DIAGNOSIS — Z191 Hormone sensitive malignancy status: Secondary | ICD-10-CM | POA: Diagnosis not present

## 2021-08-03 DIAGNOSIS — C7951 Secondary malignant neoplasm of bone: Secondary | ICD-10-CM | POA: Diagnosis not present

## 2021-08-03 DIAGNOSIS — Z79899 Other long term (current) drug therapy: Secondary | ICD-10-CM | POA: Diagnosis not present

## 2021-08-03 DIAGNOSIS — M79601 Pain in right arm: Secondary | ICD-10-CM | POA: Diagnosis not present

## 2021-08-03 DIAGNOSIS — E1165 Type 2 diabetes mellitus with hyperglycemia: Secondary | ICD-10-CM | POA: Diagnosis not present

## 2021-08-03 DIAGNOSIS — Z510 Encounter for antineoplastic radiation therapy: Secondary | ICD-10-CM | POA: Diagnosis not present

## 2021-08-03 LAB — RAD ONC ARIA SESSION SUMMARY
Course Elapsed Days: 2
Plan Fractions Treated to Date: 3
Plan Prescribed Dose Per Fraction: 2 Gy
Plan Total Fractions Prescribed: 15
Plan Total Prescribed Dose: 30 Gy
Reference Point Dosage Given to Date: 6 Gy
Reference Point Session Dosage Given: 2 Gy
Session Number: 3

## 2021-08-04 ENCOUNTER — Other Ambulatory Visit: Payer: Self-pay

## 2021-08-04 ENCOUNTER — Ambulatory Visit
Admission: RE | Admit: 2021-08-04 | Discharge: 2021-08-04 | Disposition: A | Discharge status: Home / Self Care | Payer: Medicare HMO | Source: Ambulatory Visit | Attending: Radiation Oncology | Admitting: Radiation Oncology

## 2021-08-04 ENCOUNTER — Inpatient Hospital Stay: Payer: Medicare HMO

## 2021-08-04 DIAGNOSIS — Z794 Long term (current) use of insulin: Secondary | ICD-10-CM | POA: Diagnosis not present

## 2021-08-04 DIAGNOSIS — C7951 Secondary malignant neoplasm of bone: Secondary | ICD-10-CM | POA: Diagnosis not present

## 2021-08-04 DIAGNOSIS — Z510 Encounter for antineoplastic radiation therapy: Secondary | ICD-10-CM | POA: Diagnosis not present

## 2021-08-04 DIAGNOSIS — Z191 Hormone sensitive malignancy status: Secondary | ICD-10-CM | POA: Diagnosis not present

## 2021-08-04 DIAGNOSIS — Z79899 Other long term (current) drug therapy: Secondary | ICD-10-CM | POA: Diagnosis not present

## 2021-08-04 DIAGNOSIS — M79601 Pain in right arm: Secondary | ICD-10-CM | POA: Diagnosis not present

## 2021-08-04 DIAGNOSIS — Z923 Personal history of irradiation: Secondary | ICD-10-CM | POA: Diagnosis not present

## 2021-08-04 DIAGNOSIS — C61 Malignant neoplasm of prostate: Secondary | ICD-10-CM | POA: Diagnosis not present

## 2021-08-04 DIAGNOSIS — E1165 Type 2 diabetes mellitus with hyperglycemia: Secondary | ICD-10-CM | POA: Diagnosis not present

## 2021-08-04 LAB — RAD ONC ARIA SESSION SUMMARY
Course Elapsed Days: 3
Plan Fractions Treated to Date: 4
Plan Prescribed Dose Per Fraction: 2 Gy
Plan Total Fractions Prescribed: 15
Plan Total Prescribed Dose: 30 Gy
Reference Point Dosage Given to Date: 8 Gy
Reference Point Session Dosage Given: 2 Gy
Session Number: 4

## 2021-08-05 ENCOUNTER — Telehealth: Payer: Self-pay | Admitting: Hospice and Palliative Medicine

## 2021-08-05 ENCOUNTER — Ambulatory Visit
Admission: RE | Admit: 2021-08-05 | Discharge: 2021-08-05 | Disposition: A | Discharge status: Home / Self Care | Payer: Medicare HMO | Source: Ambulatory Visit | Attending: Radiation Oncology | Admitting: Radiation Oncology

## 2021-08-05 ENCOUNTER — Other Ambulatory Visit: Payer: Self-pay

## 2021-08-05 DIAGNOSIS — Z510 Encounter for antineoplastic radiation therapy: Secondary | ICD-10-CM | POA: Diagnosis not present

## 2021-08-05 DIAGNOSIS — Z794 Long term (current) use of insulin: Secondary | ICD-10-CM | POA: Diagnosis not present

## 2021-08-05 DIAGNOSIS — Z79899 Other long term (current) drug therapy: Secondary | ICD-10-CM | POA: Diagnosis not present

## 2021-08-05 DIAGNOSIS — Z191 Hormone sensitive malignancy status: Secondary | ICD-10-CM | POA: Diagnosis not present

## 2021-08-05 DIAGNOSIS — C61 Malignant neoplasm of prostate: Secondary | ICD-10-CM | POA: Diagnosis not present

## 2021-08-05 DIAGNOSIS — C7951 Secondary malignant neoplasm of bone: Secondary | ICD-10-CM | POA: Diagnosis not present

## 2021-08-05 DIAGNOSIS — E1165 Type 2 diabetes mellitus with hyperglycemia: Secondary | ICD-10-CM | POA: Diagnosis not present

## 2021-08-05 DIAGNOSIS — Z923 Personal history of irradiation: Secondary | ICD-10-CM | POA: Diagnosis not present

## 2021-08-05 DIAGNOSIS — M79601 Pain in right arm: Secondary | ICD-10-CM | POA: Diagnosis not present

## 2021-08-05 LAB — RAD ONC ARIA SESSION SUMMARY
Course Elapsed Days: 4
Plan Fractions Treated to Date: 5
Plan Prescribed Dose Per Fraction: 2 Gy
Plan Total Fractions Prescribed: 15
Plan Total Prescribed Dose: 30 Gy
Reference Point Dosage Given to Date: 10 Gy
Reference Point Session Dosage Given: 2 Gy
Session Number: 5

## 2021-08-05 MED ORDER — OXYCODONE HCL 5 MG PO TABS: 5.0000 mg | ORAL_TABLET | ORAL | 0 refills | Status: DC | PRN

## 2021-08-05 NOTE — Telephone Encounter (Signed)
I was requested to call patient's daughter regarding pain.  Daughter reports that patient continues to have severe right arm pain and is sleeping very little as a result.  She reports the tramadol is not effective at managing the pain.  Additionally, patient has been taking acetaminophen and ibuprofen but pain remains poorly controlled.  They are asking for stronger pain medication.  We will trial oxycodone.  Rx sent to pharmacy.  Discussed importance of maintaining a regular bowel regimen to prevent opioid-induced constipation.  Plan: -DC tramadol -Start oxycodone 5 mg every 4 hours as needed for pain #45 -Could consider future addition of a long-acting opioid if needed -Daily bowel regimen with MiraLAX/senna -Follow-up telephone visit 2 weeks

## 2021-08-08 ENCOUNTER — Telehealth: Payer: Self-pay | Admitting: Internal Medicine

## 2021-08-08 ENCOUNTER — Other Ambulatory Visit: Payer: Self-pay | Admitting: *Deleted

## 2021-08-08 ENCOUNTER — Ambulatory Visit: Payer: Medicare HMO

## 2021-08-08 MED ORDER — DEXAMETHASONE 2 MG PO TABS: 2.0000 mg | ORAL_TABLET | Freq: Two times a day (BID) | ORAL | 0 refills | Status: DC

## 2021-08-08 NOTE — Telephone Encounter (Signed)
On 6/16-left a voicemail for the patient's daughter checking on the patient's arm pain.

## 2021-08-09 ENCOUNTER — Other Ambulatory Visit (HOSPITAL_COMMUNITY): Payer: Self-pay

## 2021-08-09 ENCOUNTER — Ambulatory Visit: Payer: Medicare HMO

## 2021-08-10 ENCOUNTER — Ambulatory Visit: Payer: Medicare HMO

## 2021-08-10 ENCOUNTER — Inpatient Hospital Stay: Payer: Medicare HMO

## 2021-08-11 ENCOUNTER — Ambulatory Visit: Payer: Medicare HMO

## 2021-08-11 ENCOUNTER — Inpatient Hospital Stay: Payer: Medicare HMO

## 2021-08-12 ENCOUNTER — Ambulatory Visit: Payer: Medicare HMO

## 2021-08-15 ENCOUNTER — Ambulatory Visit: Payer: Medicare HMO

## 2021-08-16 ENCOUNTER — Ambulatory Visit: Payer: Medicare HMO

## 2021-08-16 ENCOUNTER — Encounter: Payer: Self-pay | Admitting: Hospice and Palliative Medicine

## 2021-08-17 ENCOUNTER — Ambulatory Visit: Payer: Medicare HMO

## 2021-08-17 ENCOUNTER — Other Ambulatory Visit (HOSPITAL_COMMUNITY): Payer: Self-pay

## 2021-08-17 ENCOUNTER — Other Ambulatory Visit: Payer: Self-pay | Admitting: Hospice and Palliative Medicine

## 2021-08-17 DIAGNOSIS — R531 Weakness: Secondary | ICD-10-CM

## 2021-08-17 NOTE — Progress Notes (Signed)
Cane for patient. Discussed with Zack with [employer].

## 2021-08-18 ENCOUNTER — Ambulatory Visit: Payer: Medicare HMO

## 2021-08-19 ENCOUNTER — Ambulatory Visit: Payer: Medicare HMO

## 2021-08-19 ENCOUNTER — Inpatient Hospital Stay (HOSPITAL_BASED_OUTPATIENT_CLINIC_OR_DEPARTMENT_OTHER): Payer: Medicare HMO | Admitting: Hospice and Palliative Medicine

## 2021-08-19 DIAGNOSIS — Z515 Encounter for palliative care: Secondary | ICD-10-CM

## 2021-08-19 NOTE — Progress Notes (Signed)
I spoke with patient's daughter by phone. She reports that patient has been weaker recently but denies other significant changes or concerns. He remains independent. She plans to take him to [place] for a month and they are leaving tomorrow. I had previously ordered a cane for patient but daughter reports that they have not yet received that.  Spoke with DME rep, Zack, who will investigate and reach out to Dr.

## 2021-08-22 ENCOUNTER — Ambulatory Visit: Payer: Medicare HMO | Attending: Radiation Oncology

## 2021-08-22 DIAGNOSIS — Z923 Personal history of irradiation: Secondary | ICD-10-CM | POA: Insufficient documentation

## 2021-08-22 DIAGNOSIS — D509 Iron deficiency anemia, unspecified: Secondary | ICD-10-CM | POA: Insufficient documentation

## 2021-08-22 DIAGNOSIS — E1165 Type 2 diabetes mellitus with hyperglycemia: Secondary | ICD-10-CM | POA: Insufficient documentation

## 2021-08-22 DIAGNOSIS — C7951 Secondary malignant neoplasm of bone: Secondary | ICD-10-CM | POA: Insufficient documentation

## 2021-08-22 DIAGNOSIS — Z79899 Other long term (current) drug therapy: Secondary | ICD-10-CM | POA: Insufficient documentation

## 2021-08-22 DIAGNOSIS — M79601 Pain in right arm: Secondary | ICD-10-CM | POA: Insufficient documentation

## 2021-08-22 DIAGNOSIS — Z510 Encounter for antineoplastic radiation therapy: Secondary | ICD-10-CM | POA: Insufficient documentation

## 2021-08-22 DIAGNOSIS — C61 Malignant neoplasm of prostate: Secondary | ICD-10-CM | POA: Insufficient documentation

## 2021-08-22 DIAGNOSIS — Z794 Long term (current) use of insulin: Secondary | ICD-10-CM | POA: Insufficient documentation

## 2021-08-22 DIAGNOSIS — Z191 Hormone sensitive malignancy status: Secondary | ICD-10-CM | POA: Insufficient documentation

## 2021-08-24 ENCOUNTER — Ambulatory Visit: Payer: Medicare HMO

## 2021-08-24 ENCOUNTER — Other Ambulatory Visit: Payer: Self-pay | Admitting: Internal Medicine

## 2021-08-25 ENCOUNTER — Ambulatory Visit: Payer: Medicare HMO

## 2021-08-26 ENCOUNTER — Ambulatory Visit: Payer: Medicare HMO

## 2021-08-29 ENCOUNTER — Ambulatory Visit: Payer: Medicare HMO

## 2021-08-30 ENCOUNTER — Ambulatory Visit: Payer: Medicare HMO

## 2021-08-30 ENCOUNTER — Encounter: Payer: Self-pay | Admitting: Internal Medicine

## 2021-09-05 ENCOUNTER — Other Ambulatory Visit (HOSPITAL_COMMUNITY): Payer: Self-pay

## 2021-09-12 ENCOUNTER — Other Ambulatory Visit: Payer: Self-pay

## 2021-09-12 ENCOUNTER — Other Ambulatory Visit: Payer: Self-pay | Admitting: Internal Medicine

## 2021-09-14 ENCOUNTER — Encounter: Payer: Self-pay | Admitting: Internal Medicine

## 2021-09-20 ENCOUNTER — Other Ambulatory Visit: Payer: Self-pay

## 2021-09-23 DIAGNOSIS — U071 COVID-19: Secondary | ICD-10-CM | POA: Diagnosis not present

## 2021-09-23 DIAGNOSIS — J209 Acute bronchitis, unspecified: Secondary | ICD-10-CM | POA: Diagnosis not present

## 2021-09-23 DIAGNOSIS — Z03818 Encounter for observation for suspected exposure to other biological agents ruled out: Secondary | ICD-10-CM | POA: Diagnosis not present

## 2021-09-23 DIAGNOSIS — B9689 Other specified bacterial agents as the cause of diseases classified elsewhere: Secondary | ICD-10-CM | POA: Diagnosis not present

## 2021-09-23 DIAGNOSIS — J019 Acute sinusitis, unspecified: Secondary | ICD-10-CM | POA: Diagnosis not present

## 2021-09-26 ENCOUNTER — Other Ambulatory Visit (HOSPITAL_COMMUNITY): Payer: Self-pay

## 2021-09-30 ENCOUNTER — Other Ambulatory Visit (HOSPITAL_COMMUNITY): Payer: Self-pay

## 2021-09-30 ENCOUNTER — Inpatient Hospital Stay: Payer: Medicare HMO

## 2021-09-30 ENCOUNTER — Inpatient Hospital Stay: Payer: Medicare HMO | Attending: Internal Medicine

## 2021-09-30 ENCOUNTER — Encounter: Payer: Self-pay | Admitting: Internal Medicine

## 2021-09-30 ENCOUNTER — Inpatient Hospital Stay (HOSPITAL_BASED_OUTPATIENT_CLINIC_OR_DEPARTMENT_OTHER): Payer: Medicare HMO | Admitting: Internal Medicine

## 2021-09-30 ENCOUNTER — Inpatient Hospital Stay: Payer: Medicare HMO | Admitting: Internal Medicine

## 2021-09-30 VITALS — BP 131/77 | HR 97 | Temp 95.5°F | Ht 66.0 in | Wt 123.0 lb

## 2021-09-30 DIAGNOSIS — I700 Atherosclerosis of aorta: Secondary | ICD-10-CM | POA: Diagnosis not present

## 2021-09-30 DIAGNOSIS — Z79899 Other long term (current) drug therapy: Secondary | ICD-10-CM | POA: Insufficient documentation

## 2021-09-30 DIAGNOSIS — Z7952 Long term (current) use of systemic steroids: Secondary | ICD-10-CM | POA: Insufficient documentation

## 2021-09-30 DIAGNOSIS — R059 Cough, unspecified: Secondary | ICD-10-CM | POA: Diagnosis not present

## 2021-09-30 DIAGNOSIS — Z191 Hormone sensitive malignancy status: Secondary | ICD-10-CM | POA: Diagnosis not present

## 2021-09-30 DIAGNOSIS — I2510 Atherosclerotic heart disease of native coronary artery without angina pectoris: Secondary | ICD-10-CM | POA: Insufficient documentation

## 2021-09-30 DIAGNOSIS — C61 Malignant neoplasm of prostate: Secondary | ICD-10-CM | POA: Diagnosis not present

## 2021-09-30 DIAGNOSIS — E1136 Type 2 diabetes mellitus with diabetic cataract: Secondary | ICD-10-CM | POA: Insufficient documentation

## 2021-09-30 DIAGNOSIS — I7300 Raynaud's syndrome without gangrene: Secondary | ICD-10-CM | POA: Diagnosis not present

## 2021-09-30 DIAGNOSIS — E785 Hyperlipidemia, unspecified: Secondary | ICD-10-CM | POA: Diagnosis not present

## 2021-09-30 DIAGNOSIS — E1165 Type 2 diabetes mellitus with hyperglycemia: Secondary | ICD-10-CM | POA: Diagnosis not present

## 2021-09-30 DIAGNOSIS — Z7984 Long term (current) use of oral hypoglycemic drugs: Secondary | ICD-10-CM | POA: Insufficient documentation

## 2021-09-30 DIAGNOSIS — M79601 Pain in right arm: Secondary | ICD-10-CM | POA: Insufficient documentation

## 2021-09-30 DIAGNOSIS — Z79818 Long term (current) use of other agents affecting estrogen receptors and estrogen levels: Secondary | ICD-10-CM | POA: Diagnosis not present

## 2021-09-30 DIAGNOSIS — K219 Gastro-esophageal reflux disease without esophagitis: Secondary | ICD-10-CM | POA: Diagnosis not present

## 2021-09-30 DIAGNOSIS — Z794 Long term (current) use of insulin: Secondary | ICD-10-CM | POA: Insufficient documentation

## 2021-09-30 DIAGNOSIS — C7951 Secondary malignant neoplasm of bone: Secondary | ICD-10-CM | POA: Diagnosis not present

## 2021-09-30 DIAGNOSIS — D509 Iron deficiency anemia, unspecified: Secondary | ICD-10-CM | POA: Insufficient documentation

## 2021-09-30 LAB — COMPREHENSIVE METABOLIC PANEL
ALT: 13 U/L (ref 0–44)
AST: 19 U/L (ref 15–41)
Albumin: 3.6 g/dL (ref 3.5–5.0)
Alkaline Phosphatase: 52 U/L (ref 38–126)
Anion gap: 11 (ref 5–15)
BUN: 19 mg/dL (ref 8–23)
CO2: 26 mmol/L (ref 22–32)
Calcium: 9.1 mg/dL (ref 8.9–10.3)
Chloride: 97 mmol/L — ABNORMAL LOW (ref 98–111)
Creatinine, Ser: 1.13 mg/dL (ref 0.61–1.24)
GFR, Estimated: >60 mL/min (ref >60)
Glucose, Bld: 309 mg/dL — ABNORMAL HIGH (ref 70–99)
Potassium: 4 mmol/L (ref 3.5–5.1)
Sodium: 134 mmol/L — ABNORMAL LOW (ref 135–145)
Total Bilirubin: 0.3 mg/dL (ref 0.3–1.2)
Total Protein: 7.5 g/dL (ref 6.5–8.1)

## 2021-09-30 LAB — CBC WITH DIFFERENTIAL/PLATELET
Abs Immature Granulocytes: 0.22 10*3/uL — ABNORMAL HIGH (ref 0.00–0.07)
Basophils Absolute: 0 10*3/uL (ref 0.0–0.1)
Basophils Relative: 1 %
Eosinophils Absolute: 0 10*3/uL (ref 0.0–0.5)
Eosinophils Relative: 0 %
HCT: 33.5 % — ABNORMAL LOW (ref 39.0–52.0)
Hemoglobin: 11.3 g/dL — ABNORMAL LOW (ref 13.0–17.0)
Immature Granulocytes: 4 %
Lymphocytes Relative: 18 %
Lymphs Abs: 1.1 10*3/uL (ref 0.7–4.0)
MCH: 30.8 pg (ref 26.0–34.0)
MCHC: 33.7 g/dL (ref 30.0–36.0)
MCV: 91.3 fL (ref 80.0–100.0)
Monocytes Absolute: 0.7 10*3/uL (ref 0.1–1.0)
Monocytes Relative: 12 %
Neutro Abs: 3.9 10*3/uL (ref 1.7–7.7)
Neutrophils Relative %: 65 %
Platelets: 309 10*3/uL (ref 150–400)
RBC: 3.67 MIL/uL — ABNORMAL LOW (ref 4.22–5.81)
RDW: 13.5 % (ref 11.5–15.5)
WBC: 6 10*3/uL (ref 4.0–10.5)
nRBC: 0 % (ref 0.0–0.2)

## 2021-09-30 LAB — PSA: Prostatic Specific Antigen: 0.26 ng/mL (ref 0.00–4.00)

## 2021-09-30 MED ORDER — LEUPROLIDE ACETATE (6 MONTH) 45 MG ~~LOC~~ KIT
45.0000 mg | PACK | Freq: Once | SUBCUTANEOUS | Status: AC
  Administered 2021-09-30: 45 mg via SUBCUTANEOUS
  Filled 2021-09-30: qty 45

## 2021-09-30 NOTE — Progress Notes (Signed)
C/o pain in chest, has been coughing,  had covid recently.

## 2021-09-30 NOTE — Progress Notes (Signed)
[place] OFFICE PROGRESS NOTE  Patient Care Team: Michael Harrier, MD as PCP - General (Internal Medicine) Michael [name], MD as PCP - Cardiology (Cardiology) Michael Sickle, MD as Consulting Physician (Oncology)   Cancer Staging  No matching staging information was found for the patient.   Oncology History Overview Note  # FEB 2019-Metastatic prostate cancer/ castrate sensitive [bulky retroperitoneal adenopathy; invasion into the bladder; rectum] Bladder Bx- prostate. On Degarelix  # April 3rd 2019- [name] [172md];  Lupron q 6 M [[id number]]; MID AUG 2019- reduced dose to 830mday  #February 2023 bone scan PET scan progression of disease -bone metastases/ metastses-on RT to left hip/ lumbar area.-Complicated by multiple enteritis.  #May 2023 -right arm pain-secondary to tumor compression-MRI spine C7-T10 -status post evaluation with Dr. CrDonella [name] [facility] 30 [facility]   # Poorly controlled DM-on insulin  # s/p right PCN [explanted]/ Foley cath [Dr.Brandon]; multiple UTIs. [s/p ID; Dr.Ravishankar]  ----------------------------------------------------------------- DIAGNOSIS: [ FEB 2019] MET. PROSTATE CA  STAGE:   IV ;GOALS: PALLIATIVE  CURRENT/MOST RECENT THERAPY [ Feb-April2019] Lupron+ [name]    [place] 05/13/2021 Chemotherapy   Patient is on Treatment Plan : Prostate cancer- Docetaxel q weekly     09/30/2021 -  Chemotherapy   Patient is on Treatment Plan : prostate cancer Docetaxel (25) q7d         INTERVAL HISTORY: Ambulating independently.  Accompanied by his daughter.  [name].  male pleasant patient above history of castrate resistant metastatic prostate cancer currently on Xtandi/ELigard is here for follow-up/MRI cervical/thoracic spine.   In the interim patient was evaluated by radiation oncology.   Patient is status post radiation-4  C7-T4 metastatic disease-noted to have symptom improvement of his pain.  [place]  [place] post trip Dx- with COVID. S/p out patient treatment. S/p prednisone+ Z-pack. Continues to cough. White phlegm. No CXR done.   S/p radiation right arm- significant improvement of pain.  He feels good.  No weight loss.  Appetite is good.  Review of Systems  Constitutional:  Positive for malaise/fatigue. Negative for chills, diaphoresis, fever and weight loss.  HENT:  Negative for nosebleeds and sore throat.   Eyes:  Negative for double vision.  Respiratory:  Negative for cough, hemoptysis, sputum production and wheezing.   Cardiovascular:  Negative for chest pain, palpitations, orthopnea and leg swelling.  Gastrointestinal:  Negative for abdominal pain, blood in stool, constipation, diarrhea, heartburn, melena, nausea and vomiting.  Musculoskeletal:  Positive for back pain and joint pain.  Skin: Negative.  Negative for itching and rash.  Neurological:  Negative for dizziness, tingling, focal weakness, weakness and headaches.  Endo/Heme/Allergies:  Does not bruise/bleed easily.  Psychiatric/Behavioral:  Negative for depression. The patient is not nervous/anxious and does not have insomnia.     PAST MEDICAL HISTORY :  Past Medical History:  Diagnosis Date   Anemia    iron deficiency   Aortic atherosclerosis (HCC)    Bilateral carotid artery disease (HCC)    Mild plaque formation without obstructive disease noted on carotid Doppler.   Diabetes mellitus without complication ([facility]   Essential hypertension    GERD (gastroesophageal reflux disease)    Hyperlipidemia    Hypertension    Left carotid artery stenosis    Nonobstructive Coronary artery disease    a. [facility] The  patient was told about 2 blockages which did not require revascularization; b. 01/2015 Cath: LM nl, LAD 10p/m, 80md, D2 30ost, LCX nl, RCA min irregs,  EF 55-65%; b. 12/2018 MV: No ischemia/infarct. CT images w/ mod 3 vessel Cor Ca2+ and mild-mod Ao atherosclerosis.   Prostate cancer ([facility] 03/2017   Raynaud disease    a. Managed w/ nifedipine.    PAST SURGICAL HISTORY :   Past Surgical History:  Procedure Laterality Date   CARDIAC CATHETERIZATION  2010   CARDIAC CATHETERIZATION N/A 02/03/2015   Procedure: Left Heart Cath and Coronary Angiography;  Surgeon: MWellington [name] MD;  [facility];  Service: Cardiovascular;  Laterality: N/A;   CATARACT EXTRACTION Bilateral    CYSTOSCOPY W/ RETROGRADES Bilateral 04/11/2017   Procedure: CYSTOSCOPY WITH RETROGRADE PYELOGRAM;  Surgeon: BHollice Espy MD;  Location: [facility];  Service: Urology;  Laterality: Bilateral;   CYSTOSCOPY W/ RETROGRADES Bilateral 09/12/2017   Procedure: CYSTOSCOPY WITH RETROGRADE PYELOGRAM;  Surgeon: BHollice Espy MD;  Location: [facility];  Service: Urology;  Laterality: Bilateral;   CYSTOSCOPY W/ RETROGRADES Bilateral 02/25/2018   Procedure: CYSTOSCOPY WITH RETROGRADE PYELOGRAM;  Surgeon: BHollice Espy MD;  Location: [facility];  Service: Urology;  Laterality: Bilateral;   CYSTOSCOPY W/ URETERAL STENT PLACEMENT Bilateral 09/12/2017   Procedure: CYSTOSCOPY WITH STENT REPLACEMENT;  Surgeon: BHollice Espy MD;  Location: [facility];  Service: Urology;  Laterality: Bilateral;   CYSTOSCOPY W/ URETERAL STENT REMOVAL Bilateral 02/25/2018   Procedure: CYSTOSCOPY WITH STENT REMOVAL;  Surgeon: BHollice Espy MD;  Location: [facility];  Service: Urology;  Laterality: Bilateral;   CYSTOSCOPY WITH STENT PLACEMENT Left 04/11/2017   Procedure: CYSTOSCOPY WITH STENT PLACEMENT and fulgeration;  Surgeon: BHollice Espy MD;  Location: [facility];  Service: Urology;  Laterality: Left;   CYSTOSCOPY WITH URETHRAL DILATATION Bilateral 04/11/2017   Procedure: CYSTOSCOPY WITH URETHRAL DILATATION;  Surgeon: BHollice Espy MD;  Location: [facility];  Service: Urology;  Laterality:  Bilateral;   IR CONVERT RIGHT NEPHROSTOMY TO NEPHROURETERAL CATH  05/21/2017   IR NEPHROSTOMY PLACEMENT RIGHT  04/13/2017    FAMILY HISTORY :   Family History  Problem Relation Age of Onset   Hypertension Father     SOCIAL HISTORY:   Social History   Tobacco Use   Smoking status: Never   Smokeless tobacco: Never  Vaping Use   Vaping Use: Never used  Substance Use Topics   Alcohol use: No   Drug use: No    ALLERGIES:  has No Known Allergies.  MEDICATIONS:  Current Outpatient Medications  Medication Sig Dispense Refill   acetaminophen (TYLENOL) 500 MG tablet Take 500 mg by mouth every 4 (four) hours as needed for moderate pain or fever.     albuterol (PROVENTIL HFA;VENTOLIN HFA) 108 (90 Base) MCG/ACT inhaler Inhale 2 puffs into the lungs every 4 (four) hours as needed for wheezing or shortness of breath.     atorvastatin (LIPITOR) 10 MG tablet Take 10 mg by mouth daily at 6 PM.      Calcium Carb-Cholecalciferol (CALCIUM 600+D3) 600-800 MG-UNIT TABS Take 1 tablet by mouth every evening.      dexamethasone (DECADRON) 2 MG tablet Take 1 tablet (2 mg total) by mouth 2 (two) times daily with a meal. 40 tablet 0   enzalutamide (XTANDI) 40 MG tablet Take 2 tablets (80 mg total) by mouth daily. 60 tablet 3   glimepiride (AMARYL) 2 MG tablet Take 2 mg by mouth daily with breakfast.     glucose  blood (ACCU-CHEK GUIDE) test strip Use asdirected three times a day diag E11.65 100 each 3   ibuprofen (ADVIL,MOTRIN) 200 MG tablet Take 200 mg by mouth every 6 (six) hours as needed for fever or moderate pain.      iron polysaccharides (NIFEREX) 150 MG capsule Take 150 mg by mouth daily.      lisinopril (ZESTRIL) 2.5 MG tablet Take 2.5 mg by mouth daily.     metFORMIN (GLUCOPHAGE) 1000 MG tablet Take 1,000 mg by mouth 2 (two) times daily.     metoprolol succinate (TOPROL-XL) 25 MG 24 hr tablet TAKE 1 TABLET BY MOUTH EVERY DAY FOR BLOOD PRESSURE 90 tablet 3   Multiple Vitamins-Minerals (MULTIVITAMIN  WITH MINERALS) tablet Take 1 tablet by mouth daily.      MYRBETRIQ 25 MG TB24 tablet TAKE 1 TABLET (25 MG TOTAL) BY MOUTH DAILY. 90 tablet 3   NIFEdipine (ADALAT CC) 30 MG 24 hr tablet TAKE 1 TABLET BY MOUTH EVERY DAY 90 tablet 2   omeprazole (PRILOSEC) 20 MG capsule TAKE ONE CAPSULE BY MOUTH EVERY DAY 90 capsule 2   oxyCODONE (OXY IR/ROXICODONE) 5 MG immediate release tablet Take 1 tablet (5 mg total) by mouth every 4 (four) hours as needed for severe pain. 45 tablet 0   sitaGLIPtin (JANUVIA) 100 MG tablet Take 100 mg by mouth daily.     sucralfate (CARAFATE) 1 g tablet TAKE 1 TABLET (1 G TOTAL) BY MOUTH 4 TIMES A DAY WITH MEALS AND AT BEDTIME 540 tablet 1   No current facility-administered medications for this visit.    PHYSICAL EXAMINATION: ECOG PERFORMANCE STATUS: 1 - Symptomatic but completely ambulatory  BP 131/77 (BP Location: Left Arm, Patient Position: Sitting, Cuff Size: Normal)   Pulse 97   Temp (!) 95.5 F (35.3 C) (Tympanic)   Ht '5\' 6"'  (1.676 m)   Wt 123 lb (55.8 kg)   SpO2 100%   BMI 19.85 kg/m   Filed Weights   09/30/21 0825  Weight: 123 lb (55.8 kg)     Physical Exam Constitutional:      Comments: Accompanied by his daughter.  Walking himself.  HENT:     Head: Normocephalic and atraumatic.     Mouth/Throat:     Pharynx: No oropharyngeal exudate.  Eyes:     Pupils: Pupils are equal, round, and reactive to light.  Cardiovascular:     Rate and Rhythm: Normal rate and regular rhythm.  Pulmonary:     Effort: Pulmonary effort is normal. No respiratory distress.     Breath sounds: Normal breath sounds. No wheezing.  Abdominal:     General: Bowel sounds are normal. There is no distension.     Palpations: Abdomen is soft. There is no mass.     Tenderness: There is no abdominal tenderness. There is no guarding or rebound.  Musculoskeletal:        General: No tenderness. Normal range of motion.     Cervical back: Normal range of motion and neck supple.  Skin:     General: Skin is warm.  Neurological:     Mental Status: He is alert and oriented to person, place, and time.  Psychiatric:        Mood and Affect: Affect normal.      LABORATORY DATA:  I have reviewed the data as listed    Component Value Date/Time   NA 134 (L) 09/30/2021 0816   NA 136 05/07/2012 1328   K 4.0 09/30/2021 0816  K 4.5 05/07/2012 1328   CL 97 (L) 09/30/2021 0816   CL 104 05/07/2012 1328   CO2 26 09/30/2021 0816   CO2 28 05/07/2012 1328   GLUCOSE 309 (H) 09/30/2021 0816   GLUCOSE 73 05/07/2012 1328   BUN 19 09/30/2021 0816   BUN 16 05/07/2012 1328   CREATININE 1.13 09/30/2021 0816   CREATININE 0.84 05/07/2012 1328   CALCIUM 9.1 09/30/2021 0816   CALCIUM 8.8 05/07/2012 1328   PROT 7.5 09/30/2021 0816   ALBUMIN 3.6 09/30/2021 0816   AST 19 09/30/2021 0816   ALT 13 09/30/2021 0816   ALKPHOS 52 09/30/2021 0816   BILITOT 0.3 09/30/2021 0816   GFRNONAA >60 09/30/2021 0816   GFRNONAA >60 05/07/2012 1328   GFRAA >60 11/17/2019 0819   GFRAA >60 05/07/2012 1328    No results found for: "SPEP", "UPEP"  Lab Results  Component Value Date   WBC 6.0 09/30/2021   NEUTROABS 3.9 09/30/2021   HGB 11.3 (L) 09/30/2021   HCT 33.5 (L) 09/30/2021   MCV 91.3 09/30/2021   PLT 309 09/30/2021      Chemistry      Component Value Date/Time   NA 134 (L) 09/30/2021 0816   NA 136 05/07/2012 1328   K 4.0 09/30/2021 0816   K 4.5 05/07/2012 1328   CL 97 (L) 09/30/2021 0816   CL 104 05/07/2012 1328   CO2 26 09/30/2021 0816   CO2 28 05/07/2012 1328   BUN 19 09/30/2021 0816   BUN 16 05/07/2012 1328   CREATININE 1.13 09/30/2021 0816   CREATININE 0.84 05/07/2012 1328      Component Value Date/Time   CALCIUM 9.1 09/30/2021 0816   CALCIUM 8.8 05/07/2012 1328   ALKPHOS 52 09/30/2021 0816   AST 19 09/30/2021 0816   ALT 13 09/30/2021 0816   BILITOT 0.3 09/30/2021 0816       RADIOGRAPHIC STUDIES: I have personally reviewed the radiological images as listed and agreed  with the findings in the report. No results found.   ASSESSMENT & PLAN:  Prostate cancer ([place]) # Metastatic prostate cancer/[FEB 2019] castrate RESISTANT; PET scan FEB 2023-  Large lesion in the inferior LEFT iliac bone and L5 vertebral body.  Other areas include proximal femur -T2 vertebral body.  Multiple other bony areas-involved however no activity.  Positive metastatic lymph node in the LEFT pelvis; but no distant nodal metastasis or visceral metastasis; MAY 2023-MRI cervical spine/T-spine epidural tumor causing infiltration  C7-T4  of causing right arm pain-MRI spine.  See discussion below.    # Currently on Xtandi plus ADT.  PSA slowly rising. Patient continues declines/reluctant with chemotherapy for now.  However had a long discussion regarding options for chemotherapy-including Taxotere on a weekly basis.  Patient very tearful [given his son's death from esophagus cancer many years ago].    #Right arm pain-secondary to tumor compression-MRI spine C7-T10 -status post   Hypofractionated dose of 30 Gray in 15 fractions.  Discussed with Dr. Donella [name].  Prescription for Carafate given in case of esophagitis.   # Bone metastases/ metastses-currently s/p  radiation to left hip/ lumbar area MARCH 2505 [s/p RT-complicated by small bowel enteritis].  Zometa every 3 months.  # Anemia-iron deficiency-hemoglobin ~11-12- STABLE.  on PO iron.   # DM-type II- on metformin-STABLE [Dr.Solum]. -need to monitor closely on chemotherapy/steroids-300.  Recommend monitoring blood sugars closely following with PCP.  #IV access: Discussed regarding port; will hold off for now for trial of PIV.   * eliagard-  next AUG mid 2023.   # DISPOSITION:  # Eligard today # chemo education re: Taxotere weekly. # follow up in 2 week- MD; labs- cbc/cmp;  CT CAP ASAP- Eligard - Dr.B  # 40 minutes face-to-face with the patient discussing the above plan of care; more than 50% of time spent on prognosis/ natural history;  counseling and coordination.        Orders Placed This Encounter  Procedures   CT CHEST ABDOMEN PELVIS W CONTRAST    Standing Status:   Future    Standing Expiration Date:   10/01/2022    Order Specific Question:   Preferred imaging location?    Answer:   [facility]    Order Specific Question:   Radiology Contrast Protocol - do NOT remove file path    Answer:   [email or web address]   DG Chest 2 View    Standing Status:   Future    Standing Expiration Date:   10/01/2022    Order Specific Question:   Reason for Exam (SYMPTOM  OR DIAGNOSIS REQUIRED)    Answer:   cough/shortness of breath; recent COVID    Order Specific Question:   Preferred imaging location?    Answer:   [facility]   CBC with Differential/Platelet    Standing Status:   Future    Standing Expiration Date:   10/01/2022   Comprehensive metabolic panel    Standing Status:   Future    Standing Expiration Date:   10/01/2022    All questions were answered. The patient knows to call the clinic with any problems, questions or concerns.      Michael Sickle, MD 09/30/2021 6:35 PM

## 2021-09-30 NOTE — Assessment & Plan Note (Addendum)
#   Metastatic prostate cancer/[FEB 2019] castrate RESISTANT; PET scan FEB 2023-  Large lesion in the inferior LEFT iliac bone and L5 vertebral body.  Other areas include proximal femur -T2 vertebral body.  Multiple other bony areas-involved however no activity.  Positive metastatic lymph node in the LEFT pelvis; but no distant nodal metastasis or visceral metastasis; MAY 2023-MRI cervical spine/T-spine epidural tumor causing infiltration  C7-T4  of causing right arm pain-MRI spine.  See discussion below.    # Currently on Xtandi plus ADT.  PSA slowly rising. Patient continues declines/reluctant with chemotherapy for now.  However had a long discussion regarding options for chemotherapy-including Taxotere on a weekly basis.  Patient very tearful [given his son's death from esophagus cancer many years ago].    #Right arm pain-secondary to tumor compression-MRI spine C7-T10 -status post   Hypofractionated dose of 30 Gray in 15 fractions.  Discussed with Dr. Donella Stade.  Prescription for Carafate given in case of esophagitis.   # Bone metastases/ metastses-currently s/p  radiation to left hip/ lumbar area MARCH 0177 [s/p RT-complicated by small bowel enteritis].  Zometa every 3 months.  # Anemia-iron deficiency-hemoglobin ~11-12- STABLE.  on PO iron.   # DM-type II- on metformin-STABLE [Dr.Solum]. -need to monitor closely on chemotherapy/steroids-300.  Recommend monitoring blood sugars closely following with PCP.  #IV access: Discussed regarding port; will hold off for now for trial of PIV.   * eliagard- next AUG mid 2023.   # DISPOSITION:  # Eligard today # chemo education re: Taxotere weekly. # follow up in 2 week- MD; labs- cbc/cmp;  CT CAP ASAP- Eligard - Dr.B  # 40 minutes face-to-face with the patient discussing the above plan of care; more than 50% of time spent on prognosis/ natural history; counseling and coordination.

## 2021-10-03 ENCOUNTER — Inpatient Hospital Stay: Payer: Medicare HMO

## 2021-10-03 ENCOUNTER — Ambulatory Visit
Admission: RE | Admit: 2021-10-03 | Discharge: 2021-10-03 | Disposition: A | Discharge status: Home / Self Care | Payer: Medicare HMO | Source: Ambulatory Visit | Attending: Internal Medicine | Admitting: Internal Medicine

## 2021-10-03 DIAGNOSIS — C61 Malignant neoplasm of prostate: Secondary | ICD-10-CM | POA: Diagnosis not present

## 2021-10-03 DIAGNOSIS — C7951 Secondary malignant neoplasm of bone: Secondary | ICD-10-CM | POA: Diagnosis not present

## 2021-10-03 DIAGNOSIS — I700 Atherosclerosis of aorta: Secondary | ICD-10-CM | POA: Diagnosis not present

## 2021-10-03 DIAGNOSIS — I2510 Atherosclerotic heart disease of native coronary artery without angina pectoris: Secondary | ICD-10-CM | POA: Diagnosis not present

## 2021-10-03 DIAGNOSIS — M4317 Spondylolisthesis, lumbosacral region: Secondary | ICD-10-CM | POA: Diagnosis not present

## 2021-10-03 DIAGNOSIS — N3289 Other specified disorders of bladder: Secondary | ICD-10-CM | POA: Diagnosis not present

## 2021-10-03 MED ORDER — IOHEXOL 300 MG/ML  SOLN
100.0000 mL | Freq: Once | INTRAMUSCULAR | Status: AC | PRN
  Administered 2021-10-03: 100 mL via INTRAVENOUS

## 2021-10-05 ENCOUNTER — Other Ambulatory Visit: Payer: Medicare HMO

## 2021-10-05 ENCOUNTER — Other Ambulatory Visit (HOSPITAL_COMMUNITY): Payer: Self-pay

## 2021-10-07 ENCOUNTER — Telehealth: Payer: Self-pay | Admitting: *Deleted

## 2021-10-07 NOTE — Telephone Encounter (Signed)
Daughter called stating that she and patient would like for him to get his chemotherapy started when he comes in on 10/14/21 stating that he has already had his education and wants to get started

## 2021-10-07 NOTE — Telephone Encounter (Signed)
MD will call patient's daughter

## 2021-10-08 NOTE — Progress Notes (Signed)
Spoke to patient daughter regarding  results of the CT scan.  Will discuss with Dr. Erlene Quan regarding results of the CT scan/bladder mass.  We will discuss further with regards to chemotherapy at next visit.

## 2021-10-10 ENCOUNTER — Encounter: Payer: Self-pay | Admitting: Internal Medicine

## 2021-10-10 ENCOUNTER — Telehealth: Payer: Self-pay | Admitting: Internal Medicine

## 2021-10-10 NOTE — Telephone Encounter (Signed)
See separate note.

## 2021-10-10 NOTE — Telephone Encounter (Signed)
Spoke to daughter regarding the results of the CT scan-overall stable prostate cancer on imaging/PSA stable.  New bladder lesion noted will discuss with Dr. Erlene Quan.  Daughter concerned about progression/pain.  Will discuss further at next visit.  Daughter in agreement.  GB

## 2021-10-14 ENCOUNTER — Inpatient Hospital Stay: Payer: Medicare HMO

## 2021-10-14 ENCOUNTER — Encounter: Payer: Self-pay | Admitting: Internal Medicine

## 2021-10-14 ENCOUNTER — Inpatient Hospital Stay (HOSPITAL_BASED_OUTPATIENT_CLINIC_OR_DEPARTMENT_OTHER): Payer: Medicare HMO | Admitting: Internal Medicine

## 2021-10-14 DIAGNOSIS — C61 Malignant neoplasm of prostate: Secondary | ICD-10-CM

## 2021-10-14 DIAGNOSIS — Z79818 Long term (current) use of other agents affecting estrogen receptors and estrogen levels: Secondary | ICD-10-CM | POA: Diagnosis not present

## 2021-10-14 DIAGNOSIS — D509 Iron deficiency anemia, unspecified: Secondary | ICD-10-CM | POA: Diagnosis not present

## 2021-10-14 DIAGNOSIS — Z191 Hormone sensitive malignancy status: Secondary | ICD-10-CM | POA: Diagnosis not present

## 2021-10-14 DIAGNOSIS — M79601 Pain in right arm: Secondary | ICD-10-CM | POA: Diagnosis not present

## 2021-10-14 DIAGNOSIS — E1165 Type 2 diabetes mellitus with hyperglycemia: Secondary | ICD-10-CM | POA: Diagnosis not present

## 2021-10-14 DIAGNOSIS — I700 Atherosclerosis of aorta: Secondary | ICD-10-CM | POA: Diagnosis not present

## 2021-10-14 DIAGNOSIS — C7951 Secondary malignant neoplasm of bone: Secondary | ICD-10-CM | POA: Diagnosis not present

## 2021-10-14 DIAGNOSIS — E1136 Type 2 diabetes mellitus with diabetic cataract: Secondary | ICD-10-CM | POA: Diagnosis not present

## 2021-10-14 LAB — CBC WITH DIFFERENTIAL/PLATELET
Abs Immature Granulocytes: 0.04 10*3/uL (ref 0.00–0.07)
Basophils Absolute: 0 10*3/uL (ref 0.0–0.1)
Basophils Relative: 1 %
Eosinophils Absolute: 0.1 10*3/uL (ref 0.0–0.5)
Eosinophils Relative: 1 %
HCT: 33 % — ABNORMAL LOW (ref 39.0–52.0)
Hemoglobin: 10.7 g/dL — ABNORMAL LOW (ref 13.0–17.0)
Immature Granulocytes: 1 %
Lymphocytes Relative: 17 %
Lymphs Abs: 1 10*3/uL (ref 0.7–4.0)
MCH: 30.1 pg (ref 26.0–34.0)
MCHC: 32.4 g/dL (ref 30.0–36.0)
MCV: 92.7 fL (ref 80.0–100.0)
Monocytes Absolute: 0.7 10*3/uL (ref 0.1–1.0)
Monocytes Relative: 13 %
Neutro Abs: 3.9 10*3/uL (ref 1.7–7.7)
Neutrophils Relative %: 67 %
Platelets: 259 10*3/uL (ref 150–400)
RBC: 3.56 MIL/uL — ABNORMAL LOW (ref 4.22–5.81)
RDW: 13.6 % (ref 11.5–15.5)
WBC: 5.7 10*3/uL (ref 4.0–10.5)
nRBC: 0 % (ref 0.0–0.2)

## 2021-10-14 LAB — COMPREHENSIVE METABOLIC PANEL
ALT: 11 U/L (ref 0–44)
AST: 22 U/L (ref 15–41)
Albumin: 3.6 g/dL (ref 3.5–5.0)
Alkaline Phosphatase: 57 U/L (ref 38–126)
Anion gap: 6 (ref 5–15)
BUN: 13 mg/dL (ref 8–23)
CO2: 25 mmol/L (ref 22–32)
Calcium: 8.8 mg/dL — ABNORMAL LOW (ref 8.9–10.3)
Chloride: 102 mmol/L (ref 98–111)
Creatinine, Ser: 1.04 mg/dL (ref 0.61–1.24)
GFR, Estimated: >60 mL/min (ref >60)
Glucose, Bld: 222 mg/dL — ABNORMAL HIGH (ref 70–99)
Potassium: 4.8 mmol/L (ref 3.5–5.1)
Sodium: 133 mmol/L — ABNORMAL LOW (ref 135–145)
Total Bilirubin: 0.4 mg/dL (ref 0.3–1.2)
Total Protein: 7.4 g/dL (ref 6.5–8.1)

## 2021-10-14 NOTE — Assessment & Plan Note (Signed)
#   Metastatic prostate cancer/[FEB 2019] castrate RESISTANT; PET scan FEB 2023-  Large lesion in the inferior LEFT iliac bone and L5 vertebral body.  Other areas include proximal femur -T2 vertebral body.  Multiple other bony areas-involved however no activity.  Positive metastatic lymph node in the LEFT pelvis; but no distant nodal metastasis or visceral metastasis; MAY 2023-MRI cervical spine/T-spine epidural tumor causing infiltration  C7-T4  of causing right arm pain-MRI spine.   #Currently s/p cervical spine radiation-improvement of pain; PSA-trending down.  Also-CT scan August 2023 no obvious visible disease or progression of disease noted-except nodular lesion 1.5 cm noted ?  Anterior to the bladder.  Discussed with urology Dr. Erlene Quan.  Does not appear to be intraluminal-cystoscopy likely unhelpful.  Also patient at risk of infections given history of colonization.  I will discuss at the tumor conference.   #Continue Xtandi plus ADT.  Hold off starting chemotherapy at this time given patient's stable clinical status/comorbidities including poorly controlled diabetes etc.  #Right arm pain-secondary to tumor compression-MRI spine C7-T10 -status post   Hypofractionated dose of 30 Gray in 15 fractions.-Significant improvement of pain noted.  Monitor for now  # Bone metastases/ metastses-currently s/p  radiation to left hip/ lumbar area MARCH 0973 [s/p RT-complicated by small bowel enteritis].  Zometa every 3 months.  # Anemia-iron deficiency-hemoglobin ~11-12- STABLE.  on PO iron.   # DM-type II- on metformin-STABLE [Dr.Solum]. -need to monitor closely on chemotherapy/steroids-300.  Recommend monitoring blood sugars closely following with PCP.  #Post COVID cough-no concerns for any underlying pneumonia CT scan negative.  Recommend albuterol inhaler/Tessalon Perles.  Prescription given.  #IV access: Discussed regarding port; will hold off for now for trial of PIV.   * eliagard- next AUG mid 2023.    # DISPOSITION:  # follow up in 6 week- MD; labs- cbc/cmp;PSA - Dr.B  # I reviewed the blood work- with the patient in detail; also reviewed the imaging independently [as summarized above]; and with the patient in detail.

## 2021-10-14 NOTE — Progress Notes (Unsigned)
After eating patient has a cough producing white phlegm since having COVID.  Has Carafate at home but not taking at this time.

## 2021-10-18 ENCOUNTER — Encounter: Payer: Self-pay | Admitting: Internal Medicine

## 2021-10-18 NOTE — Progress Notes (Signed)
[facility] OFFICE PROGRESS NOTE  Patient Care Team: [name], MD as PCP - General (Internal Medicine) Wellington [name], MD as PCP - Cardiology (Cardiology) [name], MD as Consulting Physician (Oncology)   Cancer Staging  No matching staging information was found for the patient.   Oncology History Overview Note  # FEB 2019-Metastatic prostate cancer/ castrate sensitive [bulky retroperitoneal adenopathy; invasion into the bladder; rectum] Bladder Bx- prostate. On Degarelix  # April 3rd 2019- [name] [158md];  Lupron q 6 M [[id number]]; MID AUG 2019- reduced dose to 842mday  #February 2023 bone scan PET scan progression of disease -bone metastases/ metastses-on RT to left hip/ lumbar area.-Complicated by multiple enteritis.  #May 2023 -right arm pain-secondary to tumor compression-MRI spine C7-T10 -status post evaluation with Dr. [name] [facility] 30 Gray [facility]   # Poorly controlled DM-on insulin  # s/p right PCN [explanted]/ Foley cath [Dr.Brandon]; multiple UTIs. [s/p ID; Dr.Ravishankar]  ----------------------------------------------------------------- DIAGNOSIS: [ FEB 2019] MET. PROSTATE CA  STAGE:   IV ;GOALS: PALLIATIVE  CURRENT/MOST RECENT THERAPY [ Feb-April2019] Lupron+ [name]    [place] - 05/13/2021 Chemotherapy   Patient is on Treatment Plan : Prostate cancer- Docetaxel q weekly     09/30/2021 -  Chemotherapy   Patient is on Treatment Plan : prostate cancer Docetaxel (25) q7d         INTERVAL HISTORY: Ambulating independently.  Accompanied by his daughter.  Michael ARATA89.[id number].  male pleasant patient above history of castrate resistant metastatic prostate cancer currently on Xtandi/ELigard is here for follow-up/CT scan CAP.   Patient is status post radiation-4 C7-T4 metastatic disease-noted to have symptom improvement of his pain.  Patient  not taking any pain medication. He feels good.  No weight loss.  Appetite is good.  Complains of an ongoing cough-status post COVID.  Review of Systems  Constitutional:  Positive for malaise/fatigue. Negative for chills, diaphoresis, fever and weight loss.  HENT:  Negative for nosebleeds and sore throat.   Eyes:  Negative for double vision.  Respiratory:  Negative for cough, hemoptysis, sputum production and wheezing.   Cardiovascular:  Negative for chest pain, palpitations, orthopnea and leg swelling.  Gastrointestinal:  Negative for abdominal pain, blood in stool, constipation, diarrhea, heartburn, melena, nausea and vomiting.  Musculoskeletal:  Positive for back pain and joint pain.  Skin: Negative.  Negative for itching and rash.  Neurological:  Negative for dizziness, tingling, focal weakness, weakness and headaches.  Endo/Heme/Allergies:  Does not bruise/bleed easily.  Psychiatric/Behavioral:  Negative for depression. The patient is not nervous/anxious and does not have insomnia.     PAST MEDICAL HISTORY :  Past Medical History:  Diagnosis Date   Anemia    iron deficiency   Aortic atherosclerosis (HCC)    Bilateral carotid artery disease (HCC)    Mild plaque formation without obstructive disease noted on carotid Doppler.   Diabetes mellitus without complication ([facility]   Essential hypertension    GERD (gastroesophageal reflux disease)    Hyperlipidemia    Hypertension    Left carotid artery stenosis    Nonobstructive Coronary artery disease    a. [facility]. The patient was told about 2 blockages which did not require revascularization; b. 01/2015 Cath: LM nl, LAD 10p/m, 4540m D2 30ost, LCX nl, RCA min irregs, EF 55-65%; b. 12/2018 MV: No ischemia/infarct. CT images w/ mod 3 vessel Cor  Ca2+ and mild-mod Ao atherosclerosis.   Prostate cancer ([place]) 03/2017   Raynaud disease    a. Managed w/ nifedipine.    PAST SURGICAL HISTORY :   Past  Surgical History:  Procedure Laterality Date   CARDIAC CATHETERIZATION  2010   CARDIAC CATHETERIZATION N/A 02/03/2015   Procedure: Left Heart Cath and Coronary Angiography;  Surgeon: Wellington [name], MD;  Location: [facility];  Service: Cardiovascular;  Laterality: N/A;   CATARACT EXTRACTION Bilateral    CYSTOSCOPY W/ RETROGRADES Bilateral 04/11/2017   Procedure: CYSTOSCOPY WITH RETROGRADE PYELOGRAM;  Surgeon: [name], MD;  Location: [facility];  Service: Urology;  Laterality: Bilateral;   CYSTOSCOPY W/ RETROGRADES Bilateral 09/12/2017   Procedure: CYSTOSCOPY WITH RETROGRADE PYELOGRAM;  Surgeon: [name], MD;  Location: [facility];  Service: Urology;  Laterality: Bilateral;   CYSTOSCOPY W/ RETROGRADES Bilateral 02/25/2018   Procedure: CYSTOSCOPY WITH RETROGRADE PYELOGRAM;  Surgeon: [name], MD;  Location: [facility];  Service: Urology;  Laterality: Bilateral;   CYSTOSCOPY W/ URETERAL STENT PLACEMENT Bilateral 09/12/2017   Procedure: CYSTOSCOPY WITH STENT REPLACEMENT;  Surgeon: [name], MD;  Location: [facility];  Service: Urology;  Laterality: Bilateral;   CYSTOSCOPY W/ URETERAL STENT REMOVAL Bilateral 02/25/2018   Procedure: CYSTOSCOPY WITH STENT REMOVAL;  Surgeon: [name], MD;  Location: [facility];  Service: Urology;  Laterality: Bilateral;   CYSTOSCOPY WITH STENT PLACEMENT Left 04/11/2017   Procedure: CYSTOSCOPY WITH STENT PLACEMENT and fulgeration;  Surgeon: [name], MD;  Location: [facility];  Service: Urology;  Laterality: Left;   CYSTOSCOPY WITH URETHRAL DILATATION Bilateral 04/11/2017   Procedure: CYSTOSCOPY WITH URETHRAL DILATATION;  Surgeon: [name], MD;  Location: [facility];  Service: Urology;  Laterality: Bilateral;   IR CONVERT RIGHT NEPHROSTOMY TO NEPHROURETERAL CATH  05/21/2017   IR NEPHROSTOMY PLACEMENT RIGHT  04/13/2017    FAMILY HISTORY :   Family History  Problem Relation Age of Onset   Hypertension Father     SOCIAL HISTORY:    Social History   Tobacco Use   Smoking status: Never   Smokeless tobacco: Never  Vaping Use   Vaping Use: Never used  Substance Use Topics   Alcohol use: No   Drug use: No    ALLERGIES:  has No Known Allergies.  MEDICATIONS:  Current Outpatient Medications  Medication Sig Dispense Refill   acetaminophen (TYLENOL) 500 MG tablet Take 500 mg by mouth every 4 (four) hours as needed for moderate pain or fever.     albuterol (PROVENTIL HFA;VENTOLIN HFA) 108 (90 Base) MCG/ACT inhaler Inhale 2 puffs into the lungs every 4 (four) hours as needed for wheezing or shortness of breath.     atorvastatin (LIPITOR) 10 MG tablet Take 10 mg by mouth daily at 6 PM.      benzonatate (TESSALON) 100 MG capsule Take by mouth 3 (three) times daily as needed for cough.     Calcium Carb-Cholecalciferol (CALCIUM 600+D3) 600-800 MG-UNIT TABS Take 1 tablet by mouth every evening.      dexamethasone (DECADRON) 2 MG tablet Take 1 tablet (2 mg total) by mouth 2 (two) times daily with a meal. 40 tablet 0   enzalutamide (XTANDI) 40 MG tablet Take 2 tablets (80 mg total) by mouth daily. 60 tablet 3   glimepiride (AMARYL) 2 MG tablet Take 2 mg by mouth daily with breakfast.     glucose blood (ACCU-CHEK GUIDE) test strip Use asdirected three times a day diag E11.65 100 each 3   ibuprofen (ADVIL,MOTRIN)  200 MG tablet Take 200 mg by mouth every 6 (six) hours as needed for fever or moderate pain.      iron polysaccharides (NIFEREX) 150 MG capsule Take 150 mg by mouth daily.      lisinopril (ZESTRIL) 2.5 MG tablet Take 2.5 mg by mouth daily.     metFORMIN (GLUCOPHAGE) 1000 MG tablet Take 1,000 mg by mouth 2 (two) times daily.     metoprolol succinate (TOPROL-XL) 25 MG 24 hr tablet TAKE 1 TABLET BY MOUTH EVERY DAY FOR BLOOD PRESSURE 90 tablet 3   Multiple Vitamins-Minerals (MULTIVITAMIN WITH MINERALS) tablet Take 1 tablet by mouth daily.      MYRBETRIQ 25 MG TB24 tablet TAKE 1 TABLET (25 MG TOTAL) BY MOUTH DAILY. 90 tablet  3   NIFEdipine (ADALAT CC) 30 MG 24 hr tablet TAKE 1 TABLET BY MOUTH EVERY DAY 90 tablet 2   omeprazole (PRILOSEC) 20 MG capsule TAKE ONE CAPSULE BY MOUTH EVERY DAY 90 capsule 2   oxyCODONE (OXY IR/ROXICODONE) 5 MG immediate release tablet Take 1 tablet (5 mg total) by mouth every 4 (four) hours as needed for severe pain. 45 tablet 0   sitaGLIPtin (JANUVIA) 100 MG tablet Take 100 mg by mouth daily.     sucralfate (CARAFATE) 1 g tablet TAKE 1 TABLET (1 G TOTAL) BY MOUTH 4 TIMES A DAY WITH MEALS AND AT BEDTIME (Patient not taking: Reported on 10/14/2021) 540 tablet 1   No current facility-administered medications for this visit.    PHYSICAL EXAMINATION: ECOG PERFORMANCE STATUS: 1 - Symptomatic but completely ambulatory  BP 130/77 (BP Location: Left Arm, Patient Position: Sitting)   Pulse 77   Temp (!) 96.9 F (36.1 C) (Tympanic)   Resp 16   Ht '5\' 6"'  (1.676 m)   Wt 124 lb (56.2 kg)   BMI 20.01 kg/m   Filed Weights   10/14/21 0931  Weight: 124 lb (56.2 kg)     Physical Exam Constitutional:      Comments: Accompanied by his daughter.  Walking himself.  HENT:     Head: Normocephalic and atraumatic.     Mouth/Throat:     Pharynx: No oropharyngeal exudate.  Eyes:     Pupils: Pupils are equal, round, and reactive to light.  Cardiovascular:     Rate and Rhythm: Normal rate and regular rhythm.  Pulmonary:     Effort: Pulmonary effort is normal. No respiratory distress.     Breath sounds: Normal breath sounds. No wheezing.  Abdominal:     General: Bowel sounds are normal. There is no distension.     Palpations: Abdomen is soft. There is no mass.     Tenderness: There is no abdominal tenderness. There is no guarding or rebound.  Musculoskeletal:        General: No tenderness. Normal range of motion.     Cervical back: Normal range of motion and neck supple.  Skin:    General: Skin is warm.  Neurological:     Mental Status: He is alert and oriented to person, place, and time.   Psychiatric:        Mood and Affect: Affect normal.      LABORATORY DATA:  I have reviewed the data as listed    Component Value Date/Time   NA 133 (L) 10/14/2021 0920   NA 136 05/07/2012 1328   K 4.8 10/14/2021 0920   K 4.5 05/07/2012 1328   CL 102 10/14/2021 0920   CL 104 05/07/2012 1328  CO2 25 10/14/2021 0920   CO2 28 05/07/2012 1328   GLUCOSE 222 (H) 10/14/2021 0920   GLUCOSE 73 05/07/2012 1328   BUN 13 10/14/2021 0920   BUN 16 05/07/2012 1328   CREATININE 1.04 10/14/2021 0920   CREATININE 0.84 05/07/2012 1328   CALCIUM 8.8 (L) 10/14/2021 0920   CALCIUM 8.8 05/07/2012 1328   PROT 7.4 10/14/2021 0920   ALBUMIN 3.6 10/14/2021 0920   AST 22 10/14/2021 0920   ALT 11 10/14/2021 0920   ALKPHOS 57 10/14/2021 0920   BILITOT 0.4 10/14/2021 0920   GFRNONAA >60 10/14/2021 0920   GFRNONAA >60 05/07/2012 1328   GFRAA >60 11/17/2019 0819   GFRAA >60 05/07/2012 1328    No results found for: "SPEP", "UPEP"  Lab Results  Component Value Date   WBC 5.7 10/14/2021   NEUTROABS 3.9 10/14/2021   HGB 10.7 (L) 10/14/2021   HCT 33.0 (L) 10/14/2021   MCV 92.7 10/14/2021   PLT 259 10/14/2021      Chemistry      Component Value Date/Time   NA 133 (L) 10/14/2021 0920   NA 136 05/07/2012 1328   K 4.8 10/14/2021 0920   K 4.5 05/07/2012 1328   CL 102 10/14/2021 0920   CL 104 05/07/2012 1328   CO2 25 10/14/2021 0920   CO2 28 05/07/2012 1328   BUN 13 10/14/2021 0920   BUN 16 05/07/2012 1328   CREATININE 1.04 10/14/2021 0920   CREATININE 0.84 05/07/2012 1328      Component Value Date/Time   CALCIUM 8.8 (L) 10/14/2021 0920   CALCIUM 8.8 05/07/2012 1328   ALKPHOS 57 10/14/2021 0920   AST 22 10/14/2021 0920   ALT 11 10/14/2021 0920   BILITOT 0.4 10/14/2021 0920       RADIOGRAPHIC STUDIES: I have personally reviewed the radiological images as listed and agreed with the findings in the report. No results found.   ASSESSMENT & PLAN:  Prostate cancer ([place]) #  Metastatic prostate cancer/[FEB 2019] castrate RESISTANT; PET scan FEB 2023-  Large lesion in the inferior LEFT iliac bone and L5 vertebral body.  Other areas include proximal femur -T2 vertebral body.  Multiple other bony areas-involved however no activity.  Positive metastatic lymph node in the LEFT pelvis; but no distant nodal metastasis or visceral metastasis; MAY 2023-MRI cervical spine/T-spine epidural tumor causing infiltration  C7-T4  of causing right arm pain-MRI spine.   #Currently s/p cervical spine radiation-improvement of pain; PSA-trending down.  Also-CT scan August 2023 no obvious visible disease or progression of disease noted-except nodular lesion 1.5 cm noted ?  Anterior to the bladder.  Discussed with urology Dr. Erlene Quan.  Does not appear to be intraluminal-cystoscopy likely unhelpful.  Also patient at risk of infections given history of colonization.  I will discuss at the tumor conference.   #Continue Xtandi plus ADT.  Hold off starting chemotherapy at this time given patient's stable clinical status/comorbidities including poorly controlled diabetes etc.  #Right arm pain-secondary to tumor compression-MRI spine C7-T10 -status post   Hypofractionated dose of 30 Gray in 15 fractions.-Significant improvement of pain noted.  Monitor for now  # Bone metastases/ metastses-currently s/p  radiation to left hip/ lumbar area MARCH 4098 [s/p RT-complicated by small bowel enteritis].  Zometa every 3 months.  # Anemia-iron deficiency-hemoglobin ~11-12- STABLE.  on PO iron.   # DM-type II- on metformin-STABLE [Dr.Solum]. -need to monitor closely on chemotherapy/steroids-300.  Recommend monitoring blood sugars closely following with PCP.  #Post COVID cough-no concerns  for any underlying pneumonia CT scan negative.  Recommend albuterol inhaler/Tessalon Perles.  Prescription given.  #IV access: Discussed regarding port; will hold off for now for trial of PIV.   * eliagard- next AUG mid 2023.    # DISPOSITION:  # follow up in 6 week- MD; labs- cbc/cmp;PSA - Dr.B  # I reviewed the blood work- with the patient in detail; also reviewed the imaging independently [as summarized above]; and with the patient in detail.           Orders Placed This Encounter  Procedures   CBC with Differential/Platelet    Standing Status:   Future    Standing Expiration Date:   10/15/2022   Comprehensive metabolic panel    Standing Status:   Future    Standing Expiration Date:   10/15/2022   PSA    Standing Status:   Future    Standing Expiration Date:   10/15/2022    All questions were answered. The patient knows to call the clinic with any problems, questions or concerns.      [name], MD 10/18/2021 1:03 PM

## 2021-10-20 ENCOUNTER — Other Ambulatory Visit: Payer: Medicare HMO

## 2021-10-20 NOTE — Progress Notes (Signed)
Tumor Board Documentation  Michael [name] was presented by Dr Rogue Bussing at our Tumor Board on 10/20/2021, which included representatives from surgical, pharmacy, pulmonology, medical oncology, radiology, genetics, pathology, radiation oncology, navigation, research, internal medicine, palliative care.  Michael [name] currently presents as a current patient, for discussion with history of the following treatments: immunotherapy, neoadjuvant chemotherapy, active survellience.  Additionally, we reviewed previous medical and familial history, history of present illness, and recent lab results along with all available histopathologic and imaging studies. The tumor board considered available treatment options and made the following recommendations: Biopsy (IR to approach Bladder Nodule from side for biopsy. Dr Rogue Bussing will discuss case with Dr Erlene Quan)    The following procedures/referrals were also placed: No orders of the defined types were placed in this encounter.   Clinical Trial Status: not discussed   Staging used: Clinical Stage AJCC Staging:       Group: Stage IV Prostate CAncer with Bone Mets   National site-specific guidelines NCCN were discussed with respect to the case.  Tumor board is a meeting of clinicians from various specialty areas who evaluate and discuss patients for whom a multidisciplinary approach is being considered. Final determinations in the plan of care are those of the provider(s). The responsibility for follow up of recommendations given during tumor board is that of the provider.   Today's extended care, comprehensive team conference, Michael [name] was not present for the discussion and was not examined.   Multidisciplinary Tumor Board is a multidisciplinary case peer review process.  Decisions discussed in the Multidisciplinary Tumor Board reflect the opinions of the specialists present at the conference without having examined the patient.  Ultimately,  treatment and diagnostic decisions rest with the primary provider(s) and the patient.

## 2021-10-21 ENCOUNTER — Other Ambulatory Visit: Payer: Self-pay

## 2021-10-26 ENCOUNTER — Telehealth: Payer: Self-pay | Admitting: Internal Medicine

## 2021-10-26 NOTE — Telephone Encounter (Signed)
Spoke to patient's daughter regarding the discussion at tumor conference-the lesion appears to be in the bladder wall/or anterior the bladder; but not luminal.  Low likelihood of diagnostic value from cystoscopy/with high risk of infections post cystoscopy.  As per IR-biopsy could be performed but that the risk of bladder injury.  Discussed with the daughter-prefers to hold off biopsy at this time.  We will plan a CT scan in 3 months to assess the growth of the lesion.  The daughter in agreement.  GB

## 2021-10-27 DIAGNOSIS — R809 Proteinuria, unspecified: Secondary | ICD-10-CM | POA: Diagnosis not present

## 2021-10-27 DIAGNOSIS — E1159 Type 2 diabetes mellitus with other circulatory complications: Secondary | ICD-10-CM | POA: Diagnosis not present

## 2021-10-27 DIAGNOSIS — E1129 Type 2 diabetes mellitus with other diabetic kidney complication: Secondary | ICD-10-CM | POA: Diagnosis not present

## 2021-10-31 ENCOUNTER — Other Ambulatory Visit: Payer: Self-pay | Admitting: Internal Medicine

## 2021-10-31 ENCOUNTER — Other Ambulatory Visit (HOSPITAL_COMMUNITY): Payer: Self-pay

## 2021-10-31 DIAGNOSIS — C61 Malignant neoplasm of prostate: Secondary | ICD-10-CM

## 2021-10-31 MED ORDER — ENZALUTAMIDE 40 MG PO TABS
80.0000 mg | ORAL_TABLET | Freq: Every day | ORAL | 3 refills | Status: DC
  Filled 2021-10-31: qty 60, 30d supply, fill #0
  Filled 2021-11-24: qty 60, 30d supply, fill #1
  Filled 2021-12-22: qty 60, 30d supply, fill #2
  Filled 2022-01-20 – 2022-01-25 (×2): qty 60, 30d supply, fill #3

## 2021-11-03 ENCOUNTER — Ambulatory Visit (INDEPENDENT_AMBULATORY_CARE_PROVIDER_SITE_OTHER): Payer: Medicare HMO | Admitting: Urology

## 2021-11-03 ENCOUNTER — Encounter: Payer: Self-pay | Admitting: Urology

## 2021-11-03 ENCOUNTER — Other Ambulatory Visit (HOSPITAL_COMMUNITY): Payer: Self-pay

## 2021-11-03 VITALS — BP 133/79 | HR 83 | Ht 66.0 in | Wt 124.0 lb

## 2021-11-03 DIAGNOSIS — R8271 Bacteriuria: Secondary | ICD-10-CM | POA: Diagnosis not present

## 2021-11-03 DIAGNOSIS — N3289 Other specified disorders of bladder: Secondary | ICD-10-CM

## 2021-11-03 DIAGNOSIS — R339 Retention of urine, unspecified: Secondary | ICD-10-CM

## 2021-11-03 DIAGNOSIS — R350 Frequency of micturition: Secondary | ICD-10-CM | POA: Diagnosis not present

## 2021-11-03 LAB — BLADDER SCAN AMB NON-IMAGING: Scan Result: 420

## 2021-11-03 NOTE — Progress Notes (Signed)
11/03/2021 9:24 AM   Michael [name] [birth year] [id number]  Referring provider: [name], MD [address] [facility] [place],  [place] [address]  Chief Complaint  Patient presents with    incomplete bladder emptying    HPI: 88 year old male with advanced metastatic castrate resistant prostate cancer returns today for 67-monthfollow-up.  He has a complex history, please see previous notes for details.  He initially presented in 2019 with bladder neck contracture, bilateral hydronephrosis with advanced disease.  He was started on ADT and is now managed by Dr. BRogue Bussing  In the interim, he has had progression of his disease.  He is receiving radiation for spinal cord compression which has helped tremendously.  He is trying to put back on weight.  He continues on Eligard and half dose Xtandi.  He is on Myrbetriq for irritative urinary symptoms.  He continues to void every 2 hours day and night.  In the past, he has had PVRs in the 200 range.  PVR today was 420.  He does not feel comfortable and does feel like he empties his bladder for the most part.  Most recent cross-sectional imaging in the form of CT abdomen pelvis with contrast on 09/2021 shows no hydronephrosis and his creatinine remains at baseline.  He also has chronic bacterial colonization with ESBL organism followed by ID.  In addition to the above, he is a new finding on his most recent CT scan which is a focal 1.6 x 0.9 mass on the anterior bladder wall which had increased in size from 04/2021.  This was discussed at tumor board, felt to be probably extraluminal and the option of cystoscopy versus percutaneous biopsy was discussed.  Ultimately, they elected to repeat the CT at the 341-monthnterval to reassess.  He is accompanied today by his daughter and son as well as a trOptometristia the green machine.   PMH: Past Medical History:  Diagnosis Date   Anemia    iron deficiency   Aortic  atherosclerosis ([facility]   Bilateral carotid artery disease (HCC)    Mild plaque formation without obstructive disease noted on carotid Doppler.   Diabetes mellitus without complication ([facility]   Essential hypertension    GERD (gastroesophageal reflux disease)    Hyperlipidemia    Hypertension    Left carotid artery stenosis    Nonobstructive Coronary artery disease    a. [facility]. The patient was told about 2 blockages which did not require revascularization; b. 01/2015 Cath: LM nl, LAD 10p/m, 457m D2 30ost, LCX nl, RCA min irregs, EF 55-65%; b. 12/2018 MV: No ischemia/infarct. CT images w/ mod 3 vessel Cor Ca2+ and mild-mod Ao atherosclerosis.   Prostate cancer ([facility]/2019   Raynaud disease    a. Managed w/ nifedipine.    Surgical History: Past Surgical History:  Procedure Laterality Date   CARDIAC CATHETERIZATION  2010   CARDIAC CATHETERIZATION N/A 02/03/2015   Procedure: Left Heart Cath and Coronary Angiography;  Surgeon: MuhWellington HampshireD;  Location: [facility];  Service: Cardiovascular;  Laterality: N/A;   CATARACT EXTRACTION Bilateral    CYSTOSCOPY W/ RETROGRADES Bilateral 04/11/2017   Procedure: CYSTOSCOPY WITH RETROGRADE PYELOGRAM;  Surgeon: BraHollice EspyD;  Location: [facility];  Service: Urology;  Laterality: Bilateral;   CYSTOSCOPY W/ RETROGRADES Bilateral 09/12/2017   Procedure: CYSTOSCOPY WITH RETROGRADE PYELOGRAM;  Surgeon: BraHollice EspyD;  Location: [facility];  Service: Urology;  Laterality: Bilateral;  CYSTOSCOPY W/ RETROGRADES Bilateral 02/25/2018   Procedure: CYSTOSCOPY WITH RETROGRADE PYELOGRAM;  Surgeon: [name], MD;  Location: [facility];  Service: Urology;  Laterality: Bilateral;   CYSTOSCOPY W/ URETERAL STENT PLACEMENT Bilateral 09/12/2017   Procedure: CYSTOSCOPY WITH STENT REPLACEMENT;  Surgeon: [name], MD;  Location: [facility];  Service: Urology;  Laterality: Bilateral;   CYSTOSCOPY W/  URETERAL STENT REMOVAL Bilateral 02/25/2018   Procedure: CYSTOSCOPY WITH STENT REMOVAL;  Surgeon: [name], MD;  Location: [facility];  Service: Urology;  Laterality: Bilateral;   CYSTOSCOPY WITH STENT PLACEMENT Left 04/11/2017   Procedure: CYSTOSCOPY WITH STENT PLACEMENT and fulgeration;  Surgeon: [name], MD;  Location: [facility];  Service: Urology;  Laterality: Left;   CYSTOSCOPY WITH URETHRAL DILATATION Bilateral 04/11/2017   Procedure: CYSTOSCOPY WITH URETHRAL DILATATION;  Surgeon: [name], MD;  Location: [facility];  Service: Urology;  Laterality: Bilateral;   IR CONVERT RIGHT NEPHROSTOMY TO NEPHROURETERAL CATH  05/21/2017   IR NEPHROSTOMY PLACEMENT RIGHT  04/13/2017    Home Medications:  Allergies as of 11/03/2021   No Known Allergies      Medication List        Accurate as of November 03, 2021  9:24 AM. If you have any questions, ask your nurse or doctor.          acetaminophen 500 MG tablet Commonly known as: TYLENOL Take 500 mg by mouth every 4 (four) hours as needed for moderate pain or fever.   albuterol 108 (90 Base) MCG/ACT inhaler Commonly known as: VENTOLIN HFA Inhale 2 puffs into the lungs every 4 (four) hours as needed for wheezing or shortness of breath.   atorvastatin 10 MG tablet Commonly known as: LIPITOR Take 10 mg by mouth daily at 6 PM.   benzonatate 100 MG capsule Commonly known as: TESSALON Take by mouth 3 (three) times daily as needed for cough.   Calcium 600+D3 600-20 MG-MCG Tabs Generic drug: Calcium Carb-Cholecalciferol Take 1 tablet by mouth every evening.   dexamethasone 2 MG tablet Commonly known as: DECADRON Take 1 tablet (2 mg total) by mouth 2 (two) times daily with a meal.   enzalutamide 40 MG tablet Commonly known as: XTANDI Take 2 tablets (80 mg total) by mouth daily.   glimepiride 2 MG tablet Commonly known as: AMARYL Take 2 mg by mouth daily with breakfast.   glucose blood test strip Commonly known as:  Accu-Chek Guide Use asdirected three times a day diag E11.65   ibuprofen 200 MG tablet Commonly known as: ADVIL Take 200 mg by mouth every 6 (six) hours as needed for fever or moderate pain.   iron polysaccharides 150 MG capsule Commonly known as: NIFEREX Take 150 mg by mouth daily.   lisinopril 2.5 MG tablet Commonly known as: ZESTRIL Take 2.5 mg by mouth daily.   metFORMIN 1000 MG tablet Commonly known as: GLUCOPHAGE Take 1,000 mg by mouth 2 (two) times daily.   metoprolol succinate 25 MG 24 hr tablet Commonly known as: TOPROL-XL TAKE 1 TABLET BY MOUTH EVERY DAY FOR BLOOD PRESSURE   multivitamin with minerals tablet Take 1 tablet by mouth daily.   Myrbetriq 25 MG Tb24 tablet Generic drug: mirabegron ER TAKE 1 TABLET (25 MG TOTAL) BY MOUTH DAILY.   NIFEdipine 30 MG 24 hr tablet Commonly known as: ADALAT CC TAKE 1 TABLET BY MOUTH EVERY DAY   omeprazole 20 MG capsule Commonly known as: PRILOSEC TAKE ONE CAPSULE BY MOUTH EVERY DAY   oxyCODONE 5 MG immediate release  tablet Commonly known as: Oxy IR/ROXICODONE Take 1 tablet (5 mg total) by mouth every 4 (four) hours as needed for severe pain.   sitaGLIPtin 100 MG tablet Commonly known as: JANUVIA Take 100 mg by mouth daily.   sucralfate 1 g tablet Commonly known as: CARAFATE TAKE 1 TABLET (1 G TOTAL) BY MOUTH 4 TIMES A DAY WITH MEALS AND AT BEDTIME        Allergies: No Known Allergies  Family History: Family History  Problem Relation Age of Onset   Hypertension Father     Social History:  reports that he has never smoked. He has never used smokeless tobacco. He reports that he does not drink alcohol and does not use drugs.   Physical Exam: BP 133/79   Pulse 83   Ht '5\' 6"'$  (1.676 m)   Wt 124 lb (56.2 kg)   BMI 20.01 kg/m   Constitutional:  Alert and oriented, No acute distress. HEENT: [place] AT, moist mucus membranes.  Trachea midline, no masses. Cardiovascular: No clubbing, cyanosis, or  edema. Neurologic: Grossly intact, no focal deficits, moving all 4 extremities. Psychiatric: Normal mood and affect.  Laboratory Data: Lab Results  Component Value Date   WBC 5.7 10/14/2021   HGB 10.7 (L) 10/14/2021   HCT 33.0 (L) 10/14/2021   MCV 92.7 10/14/2021   PLT 259 10/14/2021    Lab Results  Component Value Date   CREATININE 1.04 10/14/2021   Lab Results  Component Value Date   HGBA1C 7.2 (H) 12/24/2020     Pertinent Imaging: IMPRESSION: 1. Focal solid 1.6 cm anterior midline bladder wall mass, increased in size since 05/04/2021 CT abdomen/pelvis study, suspicious for bladder neoplasm, with differential including bladder metastasis versus primary bladder neoplasm. 2. Previously described mild left pelvic sidewall adenopathy has resolved. No new or progressive lymphadenopathy. 3. Widespread patchy confluent sclerotic osseous metastatic disease throughout the axial and proximal appendicular skeleton, not appreciably changed since 12/17/2020 chest CT and 05/04/2021 CT abdomen/pelvis studies. 4. Chronic findings include: Two-vessel coronary atherosclerosis. Aortic Atherosclerosis (ICD10-I70.0).     Electronically Signed   By: Ilona Sorrel M.D.   On: 10/04/2021 12:48    Results for orders placed or performed in visit on 11/03/21  Bladder Scan (Post Void Residual) in office  Result Value Ref Range   Scan Result 420      Assessment & Plan:    1. Incomplete bladder emptying Bladder emptying seems to be worsening, now greater than 401 previously in the 200 range  That being said, he is asymptomatic from this without changes in his urinary habits, creatinine remains stable without interval development of hydronephrosis.  In the past, he has self cath to keep his bladder neck open, stopped doing this in the past several years.  I do think he is at high risk for retention.  It may be helpful to consider CIC teaching again, will most likely need a coud type  catheter with consideration of cathing every few days to ensure that he is emptying adequately.  This also allow him to have the skills and tools to get himself out of trouble if he does go into frank retention.  Both he and his son and daughter agree. - Bladder Scan (Post Void Residual) in office  2. Urinary frequency Could consider stopping Myrbetriq however this medication has been improving the quality of his life in terms of managing his urinary frequency.  3. Chronic bacteriuria Only treated for systemic signs of infection  4. Bladder mass  The above CT was personally reviewed.  On the sagittal as well as coronal views, the mass in question appears to be submucosal either within the wall or external to the bladder with mass effect into the bladder.  Notably, he has had no significant prostate cancer disease within his bladder upon initial presentation.  Given his history, most strongly suspect this is metastatic prostate cancer.  We discussed the options including percutaneous biopsy and cystoscopy.  Both of these have risk including risk of sepsis or bladder injury amongst others.  His overall health status is somewhat tenuous.  Additionally, if this does represent a primary bladder malignancy, that would lead to TURBT and further complex treatments.  The likelihood of this being a primary bladder cancer is fairly low.  Agree with somewhat conservative management, plan for repeat CT scan 2 months from now.  At that time, if it continues to enlarge, would offer an office cystoscopy.   Follow-up for CIC teaching, otherwise follow-up in 6 months  [name], MD  [place] [address], [place], [place] [address] [phone]  I spent 41 total minutes on the day of the encounter including pre-visit review of the medical record, face-to-face time with the patient, and post visit ordering of labs/imaging/tests.

## 2021-11-04 DIAGNOSIS — E1129 Type 2 diabetes mellitus with other diabetic kidney complication: Secondary | ICD-10-CM | POA: Diagnosis not present

## 2021-11-04 DIAGNOSIS — E1159 Type 2 diabetes mellitus with other circulatory complications: Secondary | ICD-10-CM | POA: Diagnosis not present

## 2021-11-04 DIAGNOSIS — R809 Proteinuria, unspecified: Secondary | ICD-10-CM | POA: Diagnosis not present

## 2021-11-15 ENCOUNTER — Ambulatory Visit: Payer: Medicare HMO | Admitting: Urology

## 2021-11-17 DIAGNOSIS — E1159 Type 2 diabetes mellitus with other circulatory complications: Secondary | ICD-10-CM | POA: Diagnosis not present

## 2021-11-17 DIAGNOSIS — R053 Chronic cough: Secondary | ICD-10-CM | POA: Diagnosis not present

## 2021-11-17 DIAGNOSIS — I10 Essential (primary) hypertension: Secondary | ICD-10-CM | POA: Diagnosis not present

## 2021-11-17 NOTE — Progress Notes (Signed)
11/18/2021 10:11 AM   Michael [name] 1933/07/07 [id number]  Referring provider: [name], [employer] [facility] [place],  [place] [address]  Urological history: 1. Prostate cancer -PSA (09/2021) 0.26 -advanced metastatic castrate resistant  -Radiation for spinal cord compression, Eligard and half dose Xtandi -managed by Dr. Rogue Bussing  2. Urinary frequency -Contributing factors of age, bladder mass, incomplete bladder emptying, diabetes, hypertension -Myrbetriq 25 mg daily  3.  Incomplete bladder emptying -PVR's 200-400 cc's  4. Chronic bacteriuria -treat only for systemic signs of infection  5. Bladder mass -Likely metastatic prostate cancer -Under surveillance at this time with serial CT scans  Chief Complaint  Patient presents with   Urinary Retention   Follow-up   HPI: Michael [name] is a 88 y.o. male who presents today for CIC instruction with his son.  We first attempted self-catheterization with a 16 [place] Coloplast coud catheter and he was unable to advance it into the bladder.  We then attempted a SpeediCath 14 [place] coud catheter and he was unable to pass it into the bladder.  It was then noticed that he had some blood from the meatus.    We then injected Lidocaine into the urethra and we were able to advanced a 16 French silicone two-way Foley with a return of 200 cc of clear yellow urine.  PMH: Past Medical History:  Diagnosis Date   Anemia    iron deficiency   Aortic atherosclerosis ([place])    Bilateral carotid artery disease (HCC)    Mild plaque formation without obstructive disease noted on carotid Doppler.   Diabetes mellitus without complication ([place])    Essential hypertension    GERD (gastroesophageal reflux disease)    Hyperlipidemia    Hypertension    Left carotid artery stenosis    Nonobstructive Coronary artery disease    a. Previous cardiac catheterization at [facility] in 2010. The patient was  told about 2 blockages which did not require revascularization; b. 01/2015 Cath: LM nl, LAD 10p/m, 52md, D2 30ost, LCX nl, RCA min irregs, EF 55-65%; b. 12/2018 MV: No ischemia/infarct. CT images w/ mod 3 vessel Cor Ca2+ and mild-mod Ao atherosclerosis.   [place]   Raynaud disease    a. Managed w/ nifedipine.    Surgical History: Past Surgical History:  Procedure Laterality Date   CARDIAC CATHETERIZATION  2010   CARDIAC CATHETERIZATION N/A 02/03/2015   Procedure: Left Heart Cath and Coronary Angiography;  Surgeon: MWellington [name] MD;  [facility];  Service: Cardiovascular;  Laterality: N/A;   CATARACT EXTRACTION Bilateral    CYSTOSCOPY W/ RETROGRADES Bilateral 04/11/2017   Procedure: CYSTOSCOPY WITH RETROGRADE PYELOGRAM;  Surgeon: BHollice Espy MD;  Location: [facility];  Service: Urology;  Laterality: Bilateral;   CYSTOSCOPY W/ RETROGRADES Bilateral 09/12/2017   Procedure: CYSTOSCOPY WITH RETROGRADE PYELOGRAM;  Surgeon: BHollice Espy MD;  Location: [facility];  Service: Urology;  Laterality: Bilateral;   CYSTOSCOPY W/ RETROGRADES Bilateral 02/25/2018   Procedure: CYSTOSCOPY WITH RETROGRADE PYELOGRAM;  Surgeon: BHollice Espy MD;  Location: [facility];  Service: Urology;  Laterality: Bilateral;   CYSTOSCOPY W/ URETERAL STENT PLACEMENT Bilateral 09/12/2017   Procedure: CYSTOSCOPY WITH STENT REPLACEMENT;  Surgeon: BHollice Espy MD;  Location: [facility];  Service: Urology;  Laterality: Bilateral;   CYSTOSCOPY W/ URETERAL STENT REMOVAL Bilateral 02/25/2018   Procedure: CYSTOSCOPY WITH STENT REMOVAL;  Surgeon: BHollice Espy MD;  Location: [facility];  Service: Urology;  Laterality: Bilateral;  CYSTOSCOPY WITH STENT PLACEMENT Left 04/11/2017   Procedure: CYSTOSCOPY WITH STENT PLACEMENT and fulgeration;  Surgeon: [name], MD;  Location: [facility];  Service: Urology;  Laterality: Left;   CYSTOSCOPY WITH URETHRAL DILATATION Bilateral 04/11/2017    Procedure: CYSTOSCOPY WITH URETHRAL DILATATION;  Surgeon: [name], MD;  Location: [facility];  Service: Urology;  Laterality: Bilateral;   IR CONVERT RIGHT NEPHROSTOMY TO NEPHROURETERAL CATH  05/21/2017   IR NEPHROSTOMY PLACEMENT RIGHT  04/13/2017    Home Medications:  Allergies as of 11/18/2021   No Known Allergies      Medication List        Accurate as of November 18, 2021 10:11 AM. If you have any questions, ask your nurse or doctor.          acetaminophen 500 MG tablet Commonly known as: TYLENOL Take 500 mg by mouth every 4 (four) hours as needed for moderate pain or fever.   albuterol 108 (90 Base) MCG/ACT inhaler Commonly known as: VENTOLIN HFA Inhale 2 puffs into the lungs every 4 (four) hours as needed for wheezing or shortness of breath.   atorvastatin 10 MG tablet Commonly known as: LIPITOR Take 10 mg by mouth daily at 6 PM.   benzonatate 100 MG capsule Commonly known as: TESSALON Take by mouth 3 (three) times daily as needed for cough.   Calcium 600+D3 600-20 MG-MCG Tabs Generic drug: Calcium Carb-Cholecalciferol Take 1 tablet by mouth every evening.   dexamethasone 2 MG tablet Commonly known as: DECADRON Take 1 tablet (2 mg total) by mouth 2 (two) times daily with a meal.   glimepiride 2 MG tablet Commonly known as: AMARYL Take 2 mg by mouth daily with breakfast.   glucose blood test strip Commonly known as: Accu-Chek Guide Use asdirected three times a day diag E11.65   ibuprofen 200 MG tablet Commonly known as: ADVIL Take 200 mg by mouth every 6 (six) hours as needed for fever or moderate pain.   iron polysaccharides 150 MG capsule Commonly known as: NIFEREX Take 150 mg by mouth daily.   lisinopril 2.5 MG tablet Commonly known as: ZESTRIL Take 2.5 mg by mouth daily.   metFORMIN 1000 MG tablet Commonly known as: GLUCOPHAGE Take 1,000 mg by mouth 2 (two) times daily.   metoprolol succinate 25 MG 24 hr tablet Commonly known as:  TOPROL-XL TAKE 1 TABLET BY MOUTH EVERY DAY FOR BLOOD PRESSURE   multivitamin with minerals tablet Take 1 tablet by mouth daily.   Myrbetriq 25 MG Tb24 tablet Generic drug: mirabegron ER TAKE 1 TABLET (25 MG TOTAL) BY MOUTH DAILY.   NIFEdipine 30 MG 24 hr tablet Commonly known as: ADALAT CC TAKE 1 TABLET BY MOUTH EVERY DAY   omeprazole 20 MG capsule Commonly known as: PRILOSEC TAKE ONE CAPSULE BY MOUTH EVERY DAY   oxyCODONE 5 MG immediate release tablet Commonly known as: Oxy IR/ROXICODONE Take 1 tablet (5 mg total) by mouth every 4 (four) hours as needed for severe pain.   sitaGLIPtin 100 MG tablet Commonly known as: JANUVIA Take 100 mg by mouth daily.   sucralfate 1 g tablet Commonly known as: CARAFATE TAKE 1 TABLET (1 G TOTAL) BY MOUTH 4 TIMES A DAY WITH MEALS AND AT BEDTIME   Xtandi 40 MG tablet Generic drug: enzalutamide Take 2 tablets (80 mg total) by mouth daily.        Allergies: No Known Allergies  Family History: Family History  Problem Relation Age of Onset   Hypertension Father  Social History:  reports that he has never smoked. He has never used smokeless tobacco. He reports that he does not drink alcohol and does not use drugs.  ROS: Pertinent ROS in HPI  Physical Exam: There were no vitals taken for this visit.  Constitutional:  Well nourished. Alert and oriented, No acute distress. HEENT: [place] AT, moist mucus membranes.  Trachea midline, no masses. Cardiovascular: No clubbing, cyanosis, or edema. Respiratory: Normal respiratory effort, no increased work of breathing. GU: No CVA tenderness.  No bladder fullness or masses.  Patient with uncircumcised phallus. Foreskin easily retracted  Urethral meatus is patent.  No penile discharge. No penile lesions or rashes. Scrotum without lesions, cysts, rashes and/or edema.   Skin: No rashes, bruises or suspicious lesions. Neurologic: Grossly intact, no focal deficits, moving all 4  extremities. Psychiatric: Normal mood and affect.  Laboratory Data: Lab Results  Component Value Date   WBC 5.7 10/14/2021   HGB 10.7 (L) 10/14/2021   HCT 33.0 (L) 10/14/2021   MCV 92.7 10/14/2021   PLT 259 10/14/2021    Lab Results  Component Value Date   CREATININE 1.04 10/14/2021    Hemoglobin A1C 4.2 - 5.6 % 8.1 High    Average Blood Glucose (Calc) mg/dL [place] - LAB  Narrative Performed by [facility] - LAB Normal Range:    4.2 - 5.6%  Increased Risk:  5.7 - 6.4%  Diabetes:        >= 6.5%  Glycemic Control for adults with diabetes:  <7%    Specimen Collected: 10/27/21 10:25   Performed by: [place]: 10/27/21 11:09  Received From: [place]  Result Received: 10/31/21 11:39   Lab Results  Component Value Date   AST 22 10/14/2021   Lab Results  Component Value Date   ALT 11 10/14/2021  I have reviewed the labs.   Pertinent Imaging: N/A  Assessment & Plan:    1. Incomplete bladder emptying -As I was the one who had to pass the final catheter to successfully get into the bladder we will have him return next week for me to pass the catheter for him again in hopes the bladder neck contracture will dilate to the point where he can easily self cath going forward -I explained that the blood from the meatus may continue on and off during the day, but he can squeeze his penis together to stop the bleeding and encouraged increased fluid intake  Return in about 1 week (around 11/25/2021) for cic.  These notes generated with voice recognition software. I apologize for typographical errors.  Royden Purl  [place] [address]  [place], [place] [address] [phone]   I spent 15 minutes on the day of the encounter to include pre-visit record review, face-to-face time with the patient, and post-visit ordering of tests.

## 2021-11-18 ENCOUNTER — Emergency Department: Payer: Medicare HMO

## 2021-11-18 ENCOUNTER — Ambulatory Visit (INDEPENDENT_AMBULATORY_CARE_PROVIDER_SITE_OTHER): Payer: Medicare HMO | Admitting: Urology

## 2021-11-18 ENCOUNTER — Inpatient Hospital Stay
Admission: EM | Admit: 2021-11-18 | Discharge: 2021-11-23 | DRG: 871 | Disposition: A | Discharge status: Home Health | Payer: Medicare HMO | Attending: Internal Medicine | Admitting: Internal Medicine

## 2021-11-18 ENCOUNTER — Encounter: Payer: Self-pay | Admitting: Urology

## 2021-11-18 DIAGNOSIS — I7300 Raynaud's syndrome without gangrene: Secondary | ICD-10-CM | POA: Diagnosis present

## 2021-11-18 DIAGNOSIS — J1282 Pneumonia due to coronavirus disease 2019: Secondary | ICD-10-CM | POA: Diagnosis present

## 2021-11-18 DIAGNOSIS — C61 Malignant neoplasm of prostate: Secondary | ICD-10-CM | POA: Diagnosis present

## 2021-11-18 DIAGNOSIS — R339 Retention of urine, unspecified: Secondary | ICD-10-CM | POA: Diagnosis not present

## 2021-11-18 DIAGNOSIS — B9620 Unspecified Escherichia coli [E. coli] as the cause of diseases classified elsewhere: Secondary | ICD-10-CM

## 2021-11-18 DIAGNOSIS — C775 Secondary and unspecified malignant neoplasm of intrapelvic lymph nodes: Secondary | ICD-10-CM | POA: Diagnosis present

## 2021-11-18 DIAGNOSIS — Z7952 Long term (current) use of systemic steroids: Secondary | ICD-10-CM

## 2021-11-18 DIAGNOSIS — R55 Syncope and collapse: Secondary | ICD-10-CM | POA: Diagnosis present

## 2021-11-18 DIAGNOSIS — A419 Sepsis, unspecified organism: Secondary | ICD-10-CM | POA: Diagnosis present

## 2021-11-18 DIAGNOSIS — D62 Acute posthemorrhagic anemia: Secondary | ICD-10-CM | POA: Diagnosis present

## 2021-11-18 DIAGNOSIS — R0602 Shortness of breath: Secondary | ICD-10-CM | POA: Diagnosis not present

## 2021-11-18 DIAGNOSIS — T380X5A Adverse effect of glucocorticoids and synthetic analogues, initial encounter: Secondary | ICD-10-CM | POA: Diagnosis not present

## 2021-11-18 DIAGNOSIS — C7911 Secondary malignant neoplasm of bladder: Secondary | ICD-10-CM | POA: Diagnosis present

## 2021-11-18 DIAGNOSIS — J9601 Acute respiratory failure with hypoxia: Secondary | ICD-10-CM | POA: Diagnosis present

## 2021-11-18 DIAGNOSIS — I110 Hypertensive heart disease with heart failure: Secondary | ICD-10-CM | POA: Diagnosis present

## 2021-11-18 DIAGNOSIS — Z8551 Personal history of malignant neoplasm of bladder: Secondary | ICD-10-CM

## 2021-11-18 DIAGNOSIS — D509 Iron deficiency anemia, unspecified: Secondary | ICD-10-CM | POA: Diagnosis present

## 2021-11-18 DIAGNOSIS — E1165 Type 2 diabetes mellitus with hyperglycemia: Secondary | ICD-10-CM | POA: Diagnosis not present

## 2021-11-18 DIAGNOSIS — R0689 Other abnormalities of breathing: Secondary | ICD-10-CM | POA: Diagnosis not present

## 2021-11-18 DIAGNOSIS — I700 Atherosclerosis of aorta: Secondary | ICD-10-CM | POA: Diagnosis present

## 2021-11-18 DIAGNOSIS — C7951 Secondary malignant neoplasm of bone: Secondary | ICD-10-CM | POA: Diagnosis present

## 2021-11-18 DIAGNOSIS — Y658 Other specified misadventures during surgical and medical care: Secondary | ICD-10-CM | POA: Diagnosis present

## 2021-11-18 DIAGNOSIS — A499 Bacterial infection, unspecified: Secondary | ICD-10-CM | POA: Diagnosis not present

## 2021-11-18 DIAGNOSIS — J929 Pleural plaque without asbestos: Secondary | ICD-10-CM | POA: Diagnosis not present

## 2021-11-18 DIAGNOSIS — R062 Wheezing: Secondary | ICD-10-CM | POA: Diagnosis not present

## 2021-11-18 DIAGNOSIS — Z923 Personal history of irradiation: Secondary | ICD-10-CM

## 2021-11-18 DIAGNOSIS — N136 Pyonephrosis: Secondary | ICD-10-CM | POA: Diagnosis present

## 2021-11-18 DIAGNOSIS — R7881 Bacteremia: Secondary | ICD-10-CM

## 2021-11-18 DIAGNOSIS — Z8249 Family history of ischemic heart disease and other diseases of the circulatory system: Secondary | ICD-10-CM

## 2021-11-18 DIAGNOSIS — E43 Unspecified severe protein-calorie malnutrition: Secondary | ICD-10-CM | POA: Diagnosis present

## 2021-11-18 DIAGNOSIS — D630 Anemia in neoplastic disease: Secondary | ICD-10-CM | POA: Diagnosis present

## 2021-11-18 DIAGNOSIS — A4151 Sepsis due to Escherichia coli [E. coli]: Principal | ICD-10-CM | POA: Diagnosis present

## 2021-11-18 DIAGNOSIS — R6520 Severe sepsis without septic shock: Secondary | ICD-10-CM | POA: Diagnosis present

## 2021-11-18 DIAGNOSIS — Z8546 Personal history of malignant neoplasm of prostate: Secondary | ICD-10-CM

## 2021-11-18 DIAGNOSIS — E119 Type 2 diabetes mellitus without complications: Secondary | ICD-10-CM | POA: Diagnosis present

## 2021-11-18 DIAGNOSIS — I2510 Atherosclerotic heart disease of native coronary artery without angina pectoris: Secondary | ICD-10-CM | POA: Diagnosis present

## 2021-11-18 DIAGNOSIS — Z7984 Long term (current) use of oral hypoglycemic drugs: Secondary | ICD-10-CM

## 2021-11-18 DIAGNOSIS — B9629 Other Escherichia coli [E. coli] as the cause of diseases classified elsewhere: Secondary | ICD-10-CM

## 2021-11-18 DIAGNOSIS — U071 COVID-19: Secondary | ICD-10-CM | POA: Diagnosis present

## 2021-11-18 DIAGNOSIS — R338 Other retention of urine: Secondary | ICD-10-CM | POA: Diagnosis not present

## 2021-11-18 DIAGNOSIS — I5033 Acute on chronic diastolic (congestive) heart failure: Secondary | ICD-10-CM | POA: Diagnosis not present

## 2021-11-18 DIAGNOSIS — N320 Bladder-neck obstruction: Secondary | ICD-10-CM | POA: Diagnosis present

## 2021-11-18 DIAGNOSIS — Z79899 Other long term (current) drug therapy: Secondary | ICD-10-CM

## 2021-11-18 DIAGNOSIS — R000 Tachycardia, unspecified: Secondary | ICD-10-CM | POA: Diagnosis not present

## 2021-11-18 DIAGNOSIS — E872 Acidosis, unspecified: Secondary | ICD-10-CM | POA: Diagnosis present

## 2021-11-18 DIAGNOSIS — J449 Chronic obstructive pulmonary disease, unspecified: Secondary | ICD-10-CM | POA: Diagnosis not present

## 2021-11-18 DIAGNOSIS — Z681 Body mass index (BMI) 19 or less, adult: Secondary | ICD-10-CM | POA: Diagnosis not present

## 2021-11-18 DIAGNOSIS — N390 Urinary tract infection, site not specified: Secondary | ICD-10-CM

## 2021-11-18 DIAGNOSIS — R319 Hematuria, unspecified: Secondary | ICD-10-CM | POA: Diagnosis present

## 2021-11-18 DIAGNOSIS — J189 Pneumonia, unspecified organism: Secondary | ICD-10-CM

## 2021-11-18 DIAGNOSIS — Z1612 Extended spectrum beta lactamase (ESBL) resistance: Secondary | ICD-10-CM | POA: Diagnosis present

## 2021-11-18 DIAGNOSIS — I6522 Occlusion and stenosis of left carotid artery: Secondary | ICD-10-CM | POA: Diagnosis present

## 2021-11-18 DIAGNOSIS — E785 Hyperlipidemia, unspecified: Secondary | ICD-10-CM | POA: Diagnosis present

## 2021-11-18 DIAGNOSIS — T8383XA Hemorrhage of genitourinary prosthetic devices, implants and grafts, initial encounter: Secondary | ICD-10-CM | POA: Diagnosis present

## 2021-11-18 DIAGNOSIS — I10 Essential (primary) hypertension: Secondary | ICD-10-CM | POA: Diagnosis present

## 2021-11-18 DIAGNOSIS — J439 Emphysema, unspecified: Secondary | ICD-10-CM | POA: Diagnosis not present

## 2021-11-18 DIAGNOSIS — N3289 Other specified disorders of bladder: Secondary | ICD-10-CM | POA: Diagnosis not present

## 2021-11-18 DIAGNOSIS — R918 Other nonspecific abnormal finding of lung field: Secondary | ICD-10-CM | POA: Diagnosis not present

## 2021-11-18 DIAGNOSIS — I5031 Acute diastolic (congestive) heart failure: Secondary | ICD-10-CM | POA: Diagnosis not present

## 2021-11-18 DIAGNOSIS — R0600 Dyspnea, unspecified: Secondary | ICD-10-CM | POA: Diagnosis not present

## 2021-11-18 DIAGNOSIS — K219 Gastro-esophageal reflux disease without esophagitis: Secondary | ICD-10-CM | POA: Diagnosis present

## 2021-11-18 LAB — CBC WITH DIFFERENTIAL/PLATELET
Abs Immature Granulocytes: 0.03 10*3/uL (ref 0.00–0.07)
Basophils Absolute: 0 10*3/uL (ref 0.0–0.1)
Basophils Relative: 0 %
Eosinophils Absolute: 0 10*3/uL (ref 0.0–0.5)
Eosinophils Relative: 0 %
HCT: 31.4 % — ABNORMAL LOW (ref 39.0–52.0)
Hemoglobin: 10.1 g/dL — ABNORMAL LOW (ref 13.0–17.0)
Immature Granulocytes: 1 %
Lymphocytes Relative: 3 %
Lymphs Abs: 0.1 10*3/uL — ABNORMAL LOW (ref 0.7–4.0)
MCH: 30.6 pg (ref 26.0–34.0)
MCHC: 32.2 g/dL (ref 30.0–36.0)
MCV: 95.2 fL (ref 80.0–100.0)
Monocytes Absolute: 0 10*3/uL — ABNORMAL LOW (ref 0.1–1.0)
Monocytes Relative: 0 %
Neutro Abs: 3.7 10*3/uL (ref 1.7–7.7)
Neutrophils Relative %: 96 %
Platelets: 217 10*3/uL (ref 150–400)
RBC: 3.3 MIL/uL — ABNORMAL LOW (ref 4.22–5.81)
RDW: 14.1 % (ref 11.5–15.5)
WBC: 3.9 10*3/uL — ABNORMAL LOW (ref 4.0–10.5)
nRBC: 0 % (ref 0.0–0.2)

## 2021-11-18 LAB — COMPREHENSIVE METABOLIC PANEL
ALT: 27 U/L (ref 0–44)
AST: 65 U/L — ABNORMAL HIGH (ref 15–41)
Albumin: 3.4 g/dL — ABNORMAL LOW (ref 3.5–5.0)
Alkaline Phosphatase: 72 U/L (ref 38–126)
Anion gap: 11 (ref 5–15)
BUN: 19 mg/dL (ref 8–23)
CO2: 20 mmol/L — ABNORMAL LOW (ref 22–32)
Calcium: 8.8 mg/dL — ABNORMAL LOW (ref 8.9–10.3)
Chloride: 103 mmol/L (ref 98–111)
Creatinine, Ser: 0.95 mg/dL (ref 0.61–1.24)
GFR, Estimated: >60 mL/min (ref >60)
Glucose, Bld: 184 mg/dL — ABNORMAL HIGH (ref 70–99)
Potassium: 3.9 mmol/L (ref 3.5–5.1)
Sodium: 134 mmol/L — ABNORMAL LOW (ref 135–145)
Total Bilirubin: 0.7 mg/dL (ref 0.3–1.2)
Total Protein: 7.2 g/dL (ref 6.5–8.1)

## 2021-11-18 LAB — TROPONIN I (HIGH SENSITIVITY): Troponin I (High Sensitivity): 10 ng/L (ref <18)

## 2021-11-18 LAB — LIPASE, BLOOD: Lipase: 31 U/L (ref 11–51)

## 2021-11-18 MED ORDER — LACTATED RINGERS IV BOLUS (SEPSIS)
1000.0000 mL | Freq: Once | INTRAVENOUS | Status: AC
  Administered 2021-11-19: 1000 mL via INTRAVENOUS

## 2021-11-18 MED ORDER — IOHEXOL 300 MG/ML  SOLN
100.0000 mL | Freq: Once | INTRAMUSCULAR | Status: AC | PRN
  Administered 2021-11-18: 100 mL via INTRAVENOUS

## 2021-11-18 MED ORDER — SODIUM CHLORIDE 0.9 % IV BOLUS
1000.0000 mL | Freq: Once | INTRAVENOUS | Status: AC
  Administered 2021-11-19: 1000 mL via INTRAVENOUS

## 2021-11-18 MED ORDER — LACTATED RINGERS IV BOLUS (SEPSIS)
250.0000 mL | Freq: Once | INTRAVENOUS | Status: AC
  Administered 2021-11-19: 250 mL via INTRAVENOUS

## 2021-11-18 MED ORDER — LACTATED RINGERS IV BOLUS (SEPSIS)
500.0000 mL | Freq: Once | INTRAVENOUS | Status: AC
  Administered 2021-11-19: 500 mL via INTRAVENOUS

## 2021-11-18 MED ORDER — SODIUM CHLORIDE 0.9 % IV SOLN
2.0000 g | Freq: Once | INTRAVENOUS | Status: AC
  Administered 2021-11-19: 2 g via INTRAVENOUS
  Filled 2021-11-18: qty 12.5

## 2021-11-18 MED ORDER — LACTATED RINGERS IV SOLN: INTRAVENOUS | Status: AC

## 2021-11-18 NOTE — ED Notes (Signed)
Pt also complaining of nausea and vomiting. Denies any SOB, CP

## 2021-11-18 NOTE — ED Provider Notes (Signed)
[facility] Provider Note   Event Date/Time   First MD Initiated Contact with Patient 11/18/21 2151     (approximate) History  Loss of Consciousness  HPI Michael [name] is a 88 y.o. male with a history of metastatic bladder cancer who presents for an episode of syncope that occurred just prior to arrival.  Patient's daughter at bedside and provides most of this history as patient speaks [name].  States that patient had an episode of syncope while trying to use the bathroom today after an attempt was made earlier in the day to catheterize him for urine as he had not been draining completely.  Since this catheterization patient had been having bleeding from the urethra that he had been complaining of pain and when he tried to use the bathroom and had an episode of syncope for approximately 30 seconds.  Patient came back to baseline with in 1 minute of regaining consciousness.  Patient has not had any subsequent episodes of loss of consciousness.  Patient is concerned as he has a history of ESBL UTI and feels that this may be the cause of his syncope.  Daughter also states that patient was given Tylenol prior to arrival be because she felt that he had a fever.  Patient is now complaining of low back pain and continued bleeding from the penis. ROS: Patient currently denies any vision changes, tinnitus, difficulty speaking, facial droop, sore throat, chest pain, shortness of breath, abdominal pain, nausea/vomiting/diarrhea, dysuria, or weakness/numbness/paresthesias in any extremity   Physical Exam  Triage Vital Signs: ED Triage Vitals  Enc Vitals Group     BP 11/18/21 1927 107/62     Pulse Rate 11/18/21 1927 (!) 110     Resp 11/18/21 1927 17     Temp 11/18/21 1927 98.3 F (36.8 C)     Temp Source 11/18/21 1927 Oral     SpO2 11/18/21 1927 100 %     Weight --      Height --      Head Circumference --      Peak Flow --      Pain Score 11/18/21 1935 0     Pain Loc  --      Pain Edu? --      Excl. in [place]? --    Most recent vital signs: Vitals:   11/18/21 1927 11/18/21 2219  BP: 107/62 123/83  Pulse: (!) 110 (!) 117  Resp: 17 (!) 25  Temp: 98.3 F (36.8 C) 98.5 F (36.9 C)  SpO2: 100% 98%   General: Awake, oriented x4. CV:  Good peripheral perfusion.  Resp:  Normal effort.  Abd:  No distention.  Other:  Elderly [place] Asian male laying in bed in moderate distress secondary to back pain.  Gross blood at the urethra ED Results / Procedures / Treatments  Labs (all labs ordered are listed, but only abnormal results are displayed) Labs Reviewed  CBC WITH DIFFERENTIAL/PLATELET - Abnormal; Notable for the following components:      Result Value   WBC 3.9 (*)    RBC 3.30 (*)    Hemoglobin 10.1 (*)    HCT 31.4 (*)    Lymphs Abs 0.1 (*)    Monocytes Absolute 0.0 (*)    All other components within normal limits  COMPREHENSIVE METABOLIC PANEL - Abnormal; Notable for the following components:   Sodium 134 (*)    CO2 20 (*)    Glucose, Bld 184 (*)  Calcium 8.8 (*)    Albumin 3.4 (*)    AST 65 (*)    All other components within normal limits  RESP PANEL BY RT-PCR (FLU A&B, COVID) ARPGX2  CULTURE, BLOOD (ROUTINE X 2)  CULTURE, BLOOD (ROUTINE X 2)  URINE CULTURE  LIPASE, BLOOD  URINALYSIS, ROUTINE W REFLEX MICROSCOPIC  LACTIC ACID, PLASMA  LACTIC ACID, PLASMA  PROTIME-INR  APTT  URINALYSIS, COMPLETE (UACMP) WITH MICROSCOPIC  CBG MONITORING, ED  TROPONIN I (HIGH SENSITIVITY)  TROPONIN I (HIGH SENSITIVITY)   EKG ED ECG REPORT I, Naaman Plummer, the attending physician, personally viewed and interpreted this ECG. Date: 11/18/2021 EKG Time: 1934 Rate: 114 Rhythm: Tachycardic sinus rhythm QRS Axis: normal Intervals: normal ST/T Wave abnormalities: normal Narrative Interpretation: Tachycardic sinus rhythm.  No evidence of acute ischemia RADIOLOGY ED MD interpretation: Pending -Agree with radiology assessment Official radiology  report(s): No results found. PROCEDURES: Critical Care performed: No .1-3 Lead EKG Interpretation  Performed by: Naaman Plummer, MD Authorized by: Naaman Plummer, MD     Interpretation: abnormal     ECG rate:  116   ECG rate assessment: normal     Rhythm: sinus tachycardia     Ectopy: none     Conduction: normal    MEDICATIONS ORDERED IN ED: Medications  sodium chloride 0.9 % bolus 1,000 mL (has no administration in time range)  lactated ringers infusion (has no administration in time range)  lactated ringers bolus 1,000 mL (has no administration in time range)    And  lactated ringers bolus 500 mL (has no administration in time range)    And  lactated ringers bolus 250 mL (has no administration in time range)  ceFEPIme (MAXIPIME) 2 g in sodium chloride 0.9 % 100 mL IVPB (has no administration in time range)  iohexol (OMNIPAQUE) 300 MG/ML solution 100 mL (100 mLs Intravenous Contrast Given 11/18/21 2322)   IMPRESSION / MDM / ASSESSMENT AND PLAN / ED COURSE  I reviewed the triage vital signs and the nursing notes.                             Differential diagnosis includes, but is not limited to, vasovagal syncope, orthostatic syncope, dehydration, ACS, arrhythmia, pathologic spinal fracture, urethral trauma The patient is on the cardiac monitor to evaluate for evidence of arrhythmia and/or significant heart rate changes. Patient's presentation is most consistent with acute presentation with potential threat to life or bodily function. Patient is a 88 year old male with the above-stated past medical history who presents after an episode of syncope while trying to use the bathroom earlier today.  Patient does have continued gross hematuria at the penis after an attempted catheterization today.  Patient's laboratory evaluation significant for stable anemia with hemoglobin of 10.1 and hematocrit of 31.4 similar to hemoglobin that was drawn 1 month ago and was 10.7.  Further work-up of  this patient is pending at the end of my shift and will be signed out to the oncoming provider.  Care of this patient will be signed out to the oncoming physician at the end of my shift.  All pertinent patient information conveyed and all questions answered.  All further care and disposition decisions will be made by the oncoming physician.   FINAL CLINICAL IMPRESSION(S) / ED DIAGNOSES   Final diagnoses:  Syncope and collapse   Rx / DC Orders   ED Discharge Orders     None  Note:  This document was prepared using Dragon voice recognition software and may include unintentional dictation errors.   Naaman Plummer, MD 11/18/21 [phone]

## 2021-11-18 NOTE — ED Triage Notes (Signed)
Via [facility] from home after syncopal episode in restroom. Prostate cancer pt having difficulty with self catheterization. 20ga right hand  BP 113/72 O2 100% room air HR 110 ETCO2 23 RR 29 CBG 219 T 97.3  Language barrier, wife assisted with translation

## 2021-11-18 NOTE — Progress Notes (Signed)
PHARMACY -  BRIEF ANTIBIOTIC NOTE   Pharmacy has received consult(s) for Cefepime from an ED provider.  The patient's profile has been reviewed for ht/wt/allergies/indication/available labs.    One time order(s) placed for Cefepime 2 gm IV X 1.   Further antibiotics/pharmacy consults should be ordered by admitting physician if indicated.                       Thank you, Michael [name] D 11/18/2021  11:13 PM

## 2021-11-19 ENCOUNTER — Emergency Department: Payer: Medicare HMO

## 2021-11-19 ENCOUNTER — Encounter: Payer: Self-pay | Admitting: Internal Medicine

## 2021-11-19 DIAGNOSIS — R338 Other retention of urine: Secondary | ICD-10-CM | POA: Diagnosis not present

## 2021-11-19 DIAGNOSIS — B9620 Unspecified Escherichia coli [E. coli] as the cause of diseases classified elsewhere: Secondary | ICD-10-CM | POA: Diagnosis not present

## 2021-11-19 DIAGNOSIS — A4151 Sepsis due to Escherichia coli [E. coli]: Principal | ICD-10-CM

## 2021-11-19 DIAGNOSIS — R339 Retention of urine, unspecified: Secondary | ICD-10-CM | POA: Diagnosis not present

## 2021-11-19 DIAGNOSIS — A499 Bacterial infection, unspecified: Secondary | ICD-10-CM | POA: Diagnosis not present

## 2021-11-19 DIAGNOSIS — D509 Iron deficiency anemia, unspecified: Secondary | ICD-10-CM | POA: Diagnosis present

## 2021-11-19 DIAGNOSIS — U071 COVID-19: Secondary | ICD-10-CM

## 2021-11-19 DIAGNOSIS — C7951 Secondary malignant neoplasm of bone: Secondary | ICD-10-CM | POA: Diagnosis present

## 2021-11-19 DIAGNOSIS — R319 Hematuria, unspecified: Secondary | ICD-10-CM | POA: Diagnosis present

## 2021-11-19 DIAGNOSIS — C775 Secondary and unspecified malignant neoplasm of intrapelvic lymph nodes: Secondary | ICD-10-CM | POA: Diagnosis present

## 2021-11-19 DIAGNOSIS — N136 Pyonephrosis: Secondary | ICD-10-CM | POA: Diagnosis present

## 2021-11-19 DIAGNOSIS — J1282 Pneumonia due to coronavirus disease 2019: Secondary | ICD-10-CM | POA: Diagnosis present

## 2021-11-19 DIAGNOSIS — A419 Sepsis, unspecified organism: Secondary | ICD-10-CM

## 2021-11-19 DIAGNOSIS — Z681 Body mass index (BMI) 19 or less, adult: Secondary | ICD-10-CM | POA: Diagnosis not present

## 2021-11-19 DIAGNOSIS — Z1612 Extended spectrum beta lactamase (ESBL) resistance: Secondary | ICD-10-CM | POA: Diagnosis present

## 2021-11-19 DIAGNOSIS — J9601 Acute respiratory failure with hypoxia: Secondary | ICD-10-CM | POA: Diagnosis present

## 2021-11-19 DIAGNOSIS — E119 Type 2 diabetes mellitus without complications: Secondary | ICD-10-CM | POA: Diagnosis present

## 2021-11-19 DIAGNOSIS — D62 Acute posthemorrhagic anemia: Secondary | ICD-10-CM | POA: Diagnosis present

## 2021-11-19 DIAGNOSIS — E872 Acidosis, unspecified: Secondary | ICD-10-CM | POA: Diagnosis present

## 2021-11-19 DIAGNOSIS — C7911 Secondary malignant neoplasm of bladder: Secondary | ICD-10-CM | POA: Diagnosis present

## 2021-11-19 DIAGNOSIS — E785 Hyperlipidemia, unspecified: Secondary | ICD-10-CM | POA: Diagnosis present

## 2021-11-19 DIAGNOSIS — I110 Hypertensive heart disease with heart failure: Secondary | ICD-10-CM | POA: Diagnosis present

## 2021-11-19 DIAGNOSIS — I6522 Occlusion and stenosis of left carotid artery: Secondary | ICD-10-CM | POA: Diagnosis present

## 2021-11-19 DIAGNOSIS — R55 Syncope and collapse: Secondary | ICD-10-CM | POA: Diagnosis present

## 2021-11-19 DIAGNOSIS — T8383XA Hemorrhage of genitourinary prosthetic devices, implants and grafts, initial encounter: Secondary | ICD-10-CM | POA: Diagnosis present

## 2021-11-19 DIAGNOSIS — E1165 Type 2 diabetes mellitus with hyperglycemia: Secondary | ICD-10-CM | POA: Diagnosis not present

## 2021-11-19 DIAGNOSIS — I5033 Acute on chronic diastolic (congestive) heart failure: Secondary | ICD-10-CM | POA: Diagnosis not present

## 2021-11-19 DIAGNOSIS — R062 Wheezing: Secondary | ICD-10-CM | POA: Diagnosis not present

## 2021-11-19 DIAGNOSIS — B9629 Other Escherichia coli [E. coli] as the cause of diseases classified elsewhere: Secondary | ICD-10-CM | POA: Diagnosis not present

## 2021-11-19 DIAGNOSIS — I5031 Acute diastolic (congestive) heart failure: Secondary | ICD-10-CM | POA: Diagnosis not present

## 2021-11-19 DIAGNOSIS — R6520 Severe sepsis without septic shock: Secondary | ICD-10-CM | POA: Diagnosis present

## 2021-11-19 DIAGNOSIS — C61 Malignant neoplasm of prostate: Secondary | ICD-10-CM | POA: Diagnosis not present

## 2021-11-19 DIAGNOSIS — R0602 Shortness of breath: Secondary | ICD-10-CM | POA: Diagnosis not present

## 2021-11-19 DIAGNOSIS — R7881 Bacteremia: Secondary | ICD-10-CM | POA: Diagnosis not present

## 2021-11-19 DIAGNOSIS — E43 Unspecified severe protein-calorie malnutrition: Secondary | ICD-10-CM | POA: Diagnosis present

## 2021-11-19 DIAGNOSIS — I700 Atherosclerosis of aorta: Secondary | ICD-10-CM | POA: Diagnosis present

## 2021-11-19 DIAGNOSIS — Y658 Other specified misadventures during surgical and medical care: Secondary | ICD-10-CM | POA: Diagnosis present

## 2021-11-19 DIAGNOSIS — N390 Urinary tract infection, site not specified: Secondary | ICD-10-CM | POA: Diagnosis not present

## 2021-11-19 DIAGNOSIS — R0600 Dyspnea, unspecified: Secondary | ICD-10-CM | POA: Diagnosis not present

## 2021-11-19 LAB — RESP PANEL BY RT-PCR (FLU A&B, COVID) ARPGX2
Influenza A by PCR: NEGATIVE
Influenza B by PCR: NEGATIVE
SARS Coronavirus 2 by RT PCR: POSITIVE — AB

## 2021-11-19 LAB — ABO/RH: ABO/RH(D): O POS

## 2021-11-19 LAB — BLOOD CULTURE ID PANEL (REFLEXED) - BCID2

## 2021-11-19 LAB — HEMOGLOBIN A1C
Hgb A1c MFr Bld: 7.2 % — ABNORMAL HIGH (ref 4.8–5.6)
Mean Plasma Glucose: 159.94 mg/dL

## 2021-11-19 LAB — CORTISOL-AM, BLOOD: Cortisol - AM: 50.9 ug/dL — ABNORMAL HIGH (ref 6.7–22.6)

## 2021-11-19 LAB — CBG MONITORING, ED
Glucose-Capillary: 243 mg/dL — ABNORMAL HIGH (ref 70–99)
Glucose-Capillary: 214 mg/dL — ABNORMAL HIGH (ref 70–99)

## 2021-11-19 LAB — CBC
HCT: 25.6 % — ABNORMAL LOW (ref 39.0–52.0)
Hemoglobin: 8.3 g/dL — ABNORMAL LOW (ref 13.0–17.0)
MCH: 30.3 pg (ref 26.0–34.0)
MCHC: 32.4 g/dL (ref 30.0–36.0)
MCV: 93.4 fL (ref 80.0–100.0)
Platelets: 178 10*3/uL (ref 150–400)
RBC: 2.74 MIL/uL — ABNORMAL LOW (ref 4.22–5.81)
RDW: 14.2 % (ref 11.5–15.5)
WBC: 13.7 10*3/uL — ABNORMAL HIGH (ref 4.0–10.5)
nRBC: 0 % (ref 0.0–0.2)

## 2021-11-19 LAB — URINALYSIS, COMPLETE (UACMP) WITH MICROSCOPIC
Bilirubin Urine: NEGATIVE
Glucose, UA: 50 mg/dL — AB
Ketones, ur: 5 mg/dL — AB
Nitrite: POSITIVE — AB
Protein, ur: 100 mg/dL — AB
RBC / HPF: >50 RBC/hpf — ABNORMAL HIGH (ref 0–5)
Specific Gravity, Urine: 1.033 — ABNORMAL HIGH (ref 1.005–1.030)
Squamous Epithelial / HPF: NONE SEEN (ref 0–5)
WBC, UA: NONE SEEN WBC/hpf (ref 0–5)
pH: 6 (ref 5.0–8.0)

## 2021-11-19 LAB — LACTIC ACID, PLASMA
Lactic Acid, Venous: 1.9 mmol/L (ref 0.5–1.9)
Lactic Acid, Venous: 2.4 mmol/L (ref 0.5–1.9)
Lactic Acid, Venous: 2.6 mmol/L (ref 0.5–1.9)
Lactic Acid, Venous: 4.2 mmol/L (ref 0.5–1.9)

## 2021-11-19 LAB — PROTIME-INR
INR: 1.3 — ABNORMAL HIGH (ref 0.8–1.2)
Prothrombin Time: 15.7 seconds — ABNORMAL HIGH (ref 11.4–15.2)

## 2021-11-19 LAB — TROPONIN I (HIGH SENSITIVITY): Troponin I (High Sensitivity): 7 ng/L (ref <18)

## 2021-11-19 LAB — IRON AND TIBC
Iron: 8 ug/dL — ABNORMAL LOW (ref 45–182)
Saturation Ratios: 3 % — ABNORMAL LOW (ref 17.9–39.5)
TIBC: 273 ug/dL (ref 250–450)
UIBC: 265 ug/dL

## 2021-11-19 LAB — APTT: aPTT: 25 seconds (ref 24–36)

## 2021-11-19 LAB — GLUCOSE, CAPILLARY
Glucose-Capillary: 210 mg/dL — ABNORMAL HIGH (ref 70–99)
Glucose-Capillary: 253 mg/dL — ABNORMAL HIGH (ref 70–99)
Glucose-Capillary: 243 mg/dL — ABNORMAL HIGH (ref 70–99)

## 2021-11-19 LAB — PREPARE RBC (CROSSMATCH)

## 2021-11-19 LAB — FERRITIN: Ferritin: 81 ng/mL (ref 24–336)

## 2021-11-19 LAB — HEMOGLOBIN AND HEMATOCRIT, BLOOD
HCT: 33.7 % — ABNORMAL LOW (ref 39.0–52.0)
Hemoglobin: 11.4 g/dL — ABNORMAL LOW (ref 13.0–17.0)

## 2021-11-19 LAB — PROCALCITONIN: Procalcitonin: 38.72 ng/mL

## 2021-11-19 LAB — VITAMIN B12: Vitamin B-12: 387 pg/mL (ref 180–914)

## 2021-11-19 MED ORDER — NIFEDIPINE ER OSMOTIC RELEASE 30 MG PO TB24
30.0000 mg | ORAL_TABLET | Freq: Every day | ORAL | Status: DC
  Filled 2021-11-19: qty 1

## 2021-11-19 MED ORDER — NIRMATRELVIR/RITONAVIR (PAXLOVID)TABLET
3.0000 | ORAL_TABLET | Freq: Two times a day (BID) | ORAL | Status: DC
  Administered 2021-11-20 – 2021-11-23 (×8): 3 via ORAL
  Filled 2021-11-19: qty 30

## 2021-11-19 MED ORDER — OXYCODONE HCL 5 MG PO TABS: 5.0000 mg | ORAL_TABLET | ORAL | Status: DC | PRN

## 2021-11-19 MED ORDER — SODIUM CHLORIDE 0.9 % IV SOLN: 2.0000 g | Freq: Two times a day (BID) | INTRAVENOUS | Status: DC

## 2021-11-19 MED ORDER — ALBUTEROL SULFATE (2.5 MG/3ML) 0.083% IN NEBU: 2.5000 mg | INHALATION_SOLUTION | Freq: Four times a day (QID) | RESPIRATORY_TRACT | Status: DC

## 2021-11-19 MED ORDER — ONDANSETRON HCL 4 MG/2ML IJ SOLN: 4.0000 mg | Freq: Four times a day (QID) | INTRAMUSCULAR | Status: DC | PRN

## 2021-11-19 MED ORDER — ATORVASTATIN CALCIUM 20 MG PO TABS: 10.0000 mg | ORAL_TABLET | Freq: Every day | ORAL | Status: DC

## 2021-11-19 MED ORDER — ACETAMINOPHEN 650 MG RE SUPP: 650.0000 mg | Freq: Four times a day (QID) | RECTAL | Status: DC | PRN

## 2021-11-19 MED ORDER — SODIUM CHLORIDE 0.9% IV SOLUTION
Freq: Once | INTRAVENOUS | Status: AC
  Filled 2021-11-19: qty 250

## 2021-11-19 MED ORDER — HYDROCODONE-ACETAMINOPHEN 5-325 MG PO TABS: 1.0000 | ORAL_TABLET | ORAL | Status: DC | PRN

## 2021-11-19 MED ORDER — INSULIN ASPART 100 UNIT/ML IJ SOLN
0.0000 [IU] | Freq: Every day | INTRAMUSCULAR | Status: DC
  Administered 2021-11-19: 2 [IU] via SUBCUTANEOUS
  Administered 2021-11-20: 3 [IU] via SUBCUTANEOUS
  Filled 2021-11-19 (×2): qty 1

## 2021-11-19 MED ORDER — STERILE WATER FOR INJECTION IJ SOLN
INTRAMUSCULAR | Status: AC
  Filled 2021-11-19: qty 10

## 2021-11-19 MED ORDER — SODIUM CHLORIDE 0.9 % IV SOLN: 2.0000 g | Freq: Three times a day (TID) | INTRAVENOUS | Status: DC

## 2021-11-19 MED ORDER — HYDROCOD POLI-CHLORPHE POLI ER 10-8 MG/5ML PO SUER
5.0000 mL | Freq: Two times a day (BID) | ORAL | Status: DC | PRN
  Administered 2021-11-23: 5 mL via ORAL
  Filled 2021-11-19: qty 5

## 2021-11-19 MED ORDER — SODIUM CHLORIDE 0.9 % IV SOLN
2.0000 g | Freq: Three times a day (TID) | INTRAVENOUS | Status: DC
  Filled 2021-11-19: qty 12.5

## 2021-11-19 MED ORDER — GUAIFENESIN-DM 100-10 MG/5ML PO SYRP: 10.0000 mL | ORAL_SOLUTION | ORAL | Status: DC | PRN

## 2021-11-19 MED ORDER — METHYLPREDNISOLONE SODIUM SUCC 125 MG IJ SOLR
125.0000 mg | Freq: Once | INTRAMUSCULAR | Status: AC
  Administered 2021-11-19: 125 mg via INTRAVENOUS
  Filled 2021-11-19: qty 2

## 2021-11-19 MED ORDER — INSULIN ASPART 100 UNIT/ML IJ SOLN
0.0000 [IU] | Freq: Three times a day (TID) | INTRAMUSCULAR | Status: DC
  Administered 2021-11-19 – 2021-11-20 (×4): 5 [IU] via SUBCUTANEOUS
  Administered 2021-11-20: 8 [IU] via SUBCUTANEOUS
  Administered 2021-11-20: 3 [IU] via SUBCUTANEOUS
  Administered 2021-11-21 (×2): 8 [IU] via SUBCUTANEOUS
  Administered 2021-11-21: 5 [IU] via SUBCUTANEOUS
  Administered 2021-11-22: 15 [IU] via SUBCUTANEOUS
  Filled 2021-11-19 (×10): qty 1

## 2021-11-19 MED ORDER — IPRATROPIUM-ALBUTEROL 0.5-2.5 (3) MG/3ML IN SOLN
3.0000 mL | Freq: Once | RESPIRATORY_TRACT | Status: AC
  Administered 2021-11-19: 3 mL via RESPIRATORY_TRACT
  Filled 2021-11-19: qty 3

## 2021-11-19 MED ORDER — SODIUM CHLORIDE 0.9 % IV BOLUS
500.0000 mL | Freq: Once | INTRAVENOUS | Status: AC
  Administered 2021-11-19: 500 mL via INTRAVENOUS

## 2021-11-19 MED ORDER — METHYLPREDNISOLONE SODIUM SUCC 40 MG IJ SOLR
40.0000 mg | Freq: Every day | INTRAMUSCULAR | Status: DC
  Administered 2021-11-20 – 2021-11-22 (×3): 40 mg via INTRAVENOUS
  Filled 2021-11-19 (×3): qty 1

## 2021-11-19 MED ORDER — METOPROLOL SUCCINATE ER 25 MG PO TB24
25.0000 mg | ORAL_TABLET | Freq: Every day | ORAL | Status: DC
  Administered 2021-11-19 – 2021-11-23 (×5): 25 mg via ORAL
  Filled 2021-11-19 (×5): qty 1

## 2021-11-19 MED ORDER — ONDANSETRON HCL 4 MG PO TABS: 4.0000 mg | ORAL_TABLET | Freq: Four times a day (QID) | ORAL | Status: DC | PRN

## 2021-11-19 MED ORDER — LISINOPRIL 5 MG PO TABS
2.5000 mg | ORAL_TABLET | Freq: Every day | ORAL | Status: DC
  Administered 2021-11-19: 2.5 mg via ORAL
  Filled 2021-11-19: qty 1

## 2021-11-19 MED ORDER — ALBUTEROL SULFATE HFA 108 (90 BASE) MCG/ACT IN AERS
1.0000 | INHALATION_SPRAY | Freq: Four times a day (QID) | RESPIRATORY_TRACT | Status: DC
  Administered 2021-11-19 – 2021-11-20 (×4): 1 via RESPIRATORY_TRACT
  Filled 2021-11-19: qty 6.7

## 2021-11-19 MED ORDER — ACETAMINOPHEN 325 MG PO TABS: 650.0000 mg | ORAL_TABLET | Freq: Four times a day (QID) | ORAL | Status: DC | PRN

## 2021-11-19 MED ORDER — ATORVASTATIN CALCIUM 10 MG PO TABS: 10.0000 mg | ORAL_TABLET | Freq: Every day | ORAL | Status: DC

## 2021-11-19 MED ORDER — SODIUM CHLORIDE 0.9 % IV SOLN
1.0000 g | Freq: Two times a day (BID) | INTRAVENOUS | Status: DC
  Administered 2021-11-19 – 2021-11-20 (×3): 1 g via INTRAVENOUS
  Filled 2021-11-19 (×2): qty 20
  Filled 2021-11-19: qty 1

## 2021-11-19 MED ORDER — NIRMATRELVIR/RITONAVIR (PAXLOVID)TABLET: 3.0000 | ORAL_TABLET | Freq: Two times a day (BID) | ORAL | Status: DC

## 2021-11-19 MED ORDER — ALBUTEROL SULFATE HFA 108 (90 BASE) MCG/ACT IN AERS: 1.0000 | INHALATION_SPRAY | RESPIRATORY_TRACT | Status: DC | PRN

## 2021-11-19 MED ORDER — SODIUM CHLORIDE 0.9 % IV SOLN
300.0000 mg | Freq: Once | INTRAVENOUS | Status: AC
  Administered 2021-11-19: 300 mg via INTRAVENOUS
  Filled 2021-11-19: qty 300

## 2021-11-19 MED ORDER — LACTATED RINGERS IV SOLN: INTRAVENOUS | Status: DC

## 2021-11-19 MED ORDER — SODIUM CHLORIDE 0.9 % IV SOLN
500.0000 mg | INTRAVENOUS | Status: DC
  Administered 2021-11-19: 500 mg via INTRAVENOUS
  Filled 2021-11-19 (×2): qty 5

## 2021-11-19 MED ORDER — IPRATROPIUM-ALBUTEROL 0.5-2.5 (3) MG/3ML IN SOLN
3.0000 mL | RESPIRATORY_TRACT | Status: DC | PRN
  Administered 2021-11-19: 3 mL via RESPIRATORY_TRACT
  Filled 2021-11-19: qty 3

## 2021-11-19 NOTE — ED Notes (Signed)
Urologist at bedside.

## 2021-11-19 NOTE — Hospital Course (Addendum)
Michael [name] is a 88 y.o. 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 Patient also had 3 days of gross hematuria after Foley cath was anchored in the doctor's office. Upon arriving the hospital, patient had hypotension, tachycardia and tachypnea. Lab test showed possible COVID.  Sodium 134, bicarb 20, renal function normal.  Leukocytosis of 13.7, hemoglobin 8.3. Patient blood culture came back with E. coli ESBL. Foley catheter was anchored by urology for urinary retention. Patient received IV fluid bolus, antibiotic changed to meropenem. CT scan showed evidence of cystitis, proctitis, groundglass changes in the lungs consistent with viral pneumonia.

## 2021-11-19 NOTE — Assessment & Plan Note (Addendum)
Initially held antihypertensives due to soft blood pressures on arrival.  Now restarted.  We will try some diuresis, likely elevated due to fluid resuscitation.

## 2021-11-19 NOTE — Consult Note (Signed)
I have been asked to see the patient by Dr. Sharen Hones, for evaluation and management of urinary retention/gross hematuria/metastatic prostate cancer.  History of present illness: Unfortunate 88 year old [place] man with advanced prostate cancer with incomplete bladder emptying and recurrent multidrug-resistant urinary tract infections who has developed urosepsis secondary to his inability to empty his bladder and catheter associated trauma.  He was in clinic yesterday and was taught to CIC, but this was difficult at that point and they were unable to do it moving forward.  He collapsed at home and was brought to the emergency room via ambulance.  He was noted to be hypotensive and have some blood per urethra with some gross hematuria as well.  CAT scan demonstrated locally advanced prostate cancer, but no clots within the patient's bladder.  His bladder was quite full.  He had no hydroureteronephrosis at this time.  Review of systems: A 12 point comprehensive review of systems was obtained and is negative unless otherwise stated in the history of present illness.  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   Endophthalmitis, right eye 10/29/2017   Type 2 diabetes mellitus without complication, with long-term current use of insulin ([place]) 06/15/2017   Pure hypercholesterolemia 06/15/2017   Anemia, unspecified 63/02/6008   Complicated UTI (urinary tract infection) 05/17/2017   Protein-calorie malnutrition, severe 05/09/2017   Acute febrile illness 05/07/2017   Goals of care, counseling/discussion  04/21/2017   Primary prostate cancer with metastasis from prostate to other site [facility])    Retroperitoneal lymphadenopathy    Hydronephrosis of right kidney    Abdominal pain 04/11/2017   Type II diabetes mellitus, uncontrolled 04/05/2017   Coronary artery disease involving native coronary artery of native heart without angina pectoris 03/15/2015   Coronary artery disease involving native coronary artery with angina pectoris ([place]) 01/22/2015   Essential hypertension    Hyperlipidemia    Spermatocele 03/26/2012   Benign localized prostatic hyperplasia with lower urinary tract symptoms (LUTS) 03/22/2012   Candidiasis of urogenital sites 03/22/2012   ED (erectile dysfunction) of organic origin 03/22/2012   Elevated prostate specific antigen (PSA) 03/22/2012   Incomplete emptying of bladder 03/22/2012   Redundant prepuce and phimosis 03/22/2012   Urge incontinence 03/22/2012    No current facility-administered medications on file prior to encounter.   Current Outpatient Medications on File Prior to Encounter  Medication Sig Dispense Refill   acetaminophen (TYLENOL) 500 MG tablet Take 500 mg by mouth every 4 (four) hours as needed for moderate pain or fever.     atorvastatin (LIPITOR) 10 MG tablet Take 10 mg by mouth daily at 6 PM.      azithromycin (ZITHROMAX) 250 MG tablet Take 250 mg by mouth as directed. Take 2 tablets ('500mg'$ ) by mouth on Day 1. Take 1 tablet ('250mg'$ ) by mouth on Days 2-5.     Calcium Carb-Cholecalciferol (CALCIUM 600+D3) 600-800 MG-UNIT TABS Take 1 tablet by mouth every evening.      enzalutamide (XTANDI) 40 MG tablet Take 2 tablets (80 mg total) by mouth daily. 60 tablet 3   glimepiride (AMARYL) 2 MG tablet Take 2  mg by mouth daily with breakfast.     ibuprofen (ADVIL,MOTRIN) 200 MG tablet Take 200 mg by mouth every 6 (six) hours as needed for fever or moderate pain.      iron polysaccharides (NIFEREX) 150 MG capsule Take 150 mg by mouth daily.      lisinopril  (ZESTRIL) 2.5 MG tablet Take 2.5 mg by mouth daily.     metFORMIN (GLUCOPHAGE) 1000 MG tablet Take 1,000 mg by mouth 2 (two) times daily.     metoprolol succinate (TOPROL-XL) 25 MG 24 hr tablet TAKE 1 TABLET BY MOUTH EVERY DAY FOR BLOOD PRESSURE 90 tablet 3   Multiple Vitamins-Minerals (MULTIVITAMIN WITH MINERALS) tablet Take 1 tablet by mouth daily.      MYRBETRIQ 25 MG TB24 tablet TAKE 1 TABLET (25 MG TOTAL) BY MOUTH DAILY. 90 tablet 3   NIFEdipine (ADALAT CC) 30 MG 24 hr tablet TAKE 1 TABLET BY MOUTH EVERY DAY 90 tablet 2   omeprazole (PRILOSEC) 20 MG capsule TAKE ONE CAPSULE BY MOUTH EVERY DAY 90 capsule 2   pioglitazone (ACTOS) 30 MG tablet Take 30 mg by mouth daily.     sitaGLIPtin (JANUVIA) 100 MG tablet Take 100 mg by mouth daily.     sucralfate (CARAFATE) 1 g tablet TAKE 1 TABLET (1 G TOTAL) BY MOUTH 4 TIMES A DAY WITH MEALS AND AT BEDTIME 540 tablet 1   traMADol (ULTRAM) 50 MG tablet Take 50 mg by mouth every 8 (eight) hours as needed for moderate pain or severe pain.     albuterol (PROVENTIL HFA;VENTOLIN HFA) 108 (90 Base) MCG/ACT inhaler Inhale 2 puffs into the lungs every 4 (four) hours as needed for wheezing or shortness of breath. (Patient not taking: Reported on 11/19/2021)     benzonatate (TESSALON) 100 MG capsule Take by mouth 3 (three) times daily as needed for cough. (Patient not taking: Reported on 11/19/2021)     dexamethasone (DECADRON) 2 MG tablet Take 1 tablet (2 mg total) by mouth 2 (two) times daily with a meal. (Patient not taking: Reported on 11/19/2021) 40 tablet 0   glucose blood (ACCU-CHEK GUIDE) test strip Use asdirected three times a day diag E11.65 100 each 3   oxyCODONE (OXY IR/ROXICODONE) 5 MG immediate release tablet Take 1 tablet (5 mg total) by mouth every 4 (four) hours as needed for severe pain. (Patient not taking: Reported on 11/19/2021) 45 tablet 0    Past Medical History:  Diagnosis Date   Anemia    iron deficiency   Aortic atherosclerosis (HCC)     Bilateral carotid artery disease (HCC)    Mild plaque formation without obstructive disease noted on carotid Doppler.   Diabetes mellitus without complication ([place])    Essential hypertension    GERD (gastroesophageal reflux disease)    Hyperlipidemia    Hypertension    Left carotid artery stenosis    Nonobstructive Coronary artery disease    a. Previous cardiac catheterization at [facility] in 2010. The patient was told about 2 blockages which did not require revascularization; b. 01/2015 Cath: LM nl, LAD 10p/m, 68md, D2 30ost, LCX nl, RCA min irregs, EF 55-65%; b. 12/2018 MV: No ischemia/infarct. CT images w/ mod 3 vessel Cor Ca2+ and mild-mod Ao atherosclerosis.   Prostate cancer ([facility] 03/2017   Raynaud disease    a. Managed w/ nifedipine.    Past Surgical History:  Procedure Laterality Date   CARDIAC CATHETERIZATION  2010   CARDIAC CATHETERIZATION N/A 02/03/2015   Procedure: Left Heart Cath  and Coronary Angiography;  Surgeon: Wellington [name], MD;  Location: [facility];  Service: Cardiovascular;  Laterality: N/A;   CATARACT EXTRACTION Bilateral    CYSTOSCOPY W/ RETROGRADES Bilateral 04/11/2017   Procedure: CYSTOSCOPY WITH RETROGRADE PYELOGRAM;  Surgeon: [name], MD;  Location: [facility];  Service: Urology;  Laterality: Bilateral;   CYSTOSCOPY W/ RETROGRADES Bilateral 09/12/2017   Procedure: CYSTOSCOPY WITH RETROGRADE PYELOGRAM;  Surgeon: [name], MD;  Location: [facility];  Service: Urology;  Laterality: Bilateral;   CYSTOSCOPY W/ RETROGRADES Bilateral 02/25/2018   Procedure: CYSTOSCOPY WITH RETROGRADE PYELOGRAM;  Surgeon: [name], MD;  Location: [facility];  Service: Urology;  Laterality: Bilateral;   CYSTOSCOPY W/ URETERAL STENT PLACEMENT Bilateral 09/12/2017   Procedure: CYSTOSCOPY WITH STENT REPLACEMENT;  Surgeon: [name], MD;  Location: [facility];  Service: Urology;  Laterality: Bilateral;   CYSTOSCOPY W/ URETERAL STENT REMOVAL Bilateral 02/25/2018    Procedure: CYSTOSCOPY WITH STENT REMOVAL;  Surgeon: [name], MD;  Location: [facility];  Service: Urology;  Laterality: Bilateral;   CYSTOSCOPY WITH STENT PLACEMENT Left 04/11/2017   Procedure: CYSTOSCOPY WITH STENT PLACEMENT and fulgeration;  Surgeon: [name], MD;  Location: [facility];  Service: Urology;  Laterality: Left;   CYSTOSCOPY WITH URETHRAL DILATATION Bilateral 04/11/2017   Procedure: CYSTOSCOPY WITH URETHRAL DILATATION;  Surgeon: [name], MD;  Location: [facility];  Service: Urology;  Laterality: Bilateral;   IR CONVERT RIGHT NEPHROSTOMY TO NEPHROURETERAL CATH  05/21/2017   IR NEPHROSTOMY PLACEMENT RIGHT  04/13/2017    Social History   Tobacco Use   Smoking status: Never   Smokeless tobacco: Never  Vaping Use   Vaping Use: Never used  Substance Use Topics   Alcohol use: No   Drug use: No    Family History  Problem Relation Age of Onset   Hypertension Father     PE: Vitals:   11/19/21 0330 11/19/21 0345 11/19/21 0530 11/19/21 0545  BP: (!) 100/56 (!) 98/52 (!) 104/56 (!) 102/55  Pulse: (!) 127 (!) 127 (!) 121 (!) 119  Resp: (!) 25 (!) 26 (!) 25 (!) 22  Temp:    98.2 F (36.8 C)  TempSrc:    Oral  SpO2: 99% 99% 100% 100%   Patient appears to be in no acute distress  patient is alert and oriented x3 Atraumatic normocephalic head No cervical or supraclavicular lymphadenopathy appreciated No increased work of breathing, no audible wheezes/rhonchi Regular sinus rhythm/rate Abdomen is soft, nontender, nondistended, no CVA or suprapubic tenderness Lower extremities are symmetric without appreciable edema Grossly neurologically intact No identifiable skin lesions  Recent Labs    11/18/21 1937 11/19/21 0416  WBC 3.9* 13.7*  HGB 10.1* 8.3*  HCT 31.4* 25.6*   Recent Labs    11/18/21 2024  NA 134*  K 3.9  CL 103  CO2 20*  GLUCOSE 184*  BUN 19  CREATININE 0.95  CALCIUM 8.8*   Recent Labs    11/18/21 2340  INR 1.3*   No results for  input(s): "LABURIN" in the last 72 hours. Results for orders placed or performed during the hospital encounter of 11/18/21  Blood Culture (routine x 2)     Status: None (Preliminary result)   Collection Time: 11/18/21 11:39 PM   Specimen: BLOOD  Result Value Ref Range Status   Specimen Description BLOOD RIGHT ANTECUBITAL  Final   Special Requests   Final    BOTTLES DRAWN AEROBIC AND ANAEROBIC Blood Culture results may not be optimal due to an  inadequate volume of blood received in culture bottles   Culture   Final    NO GROWTH < 12 HOURS Performed at [facility], [address] [place], [place] [address]    Report Status PENDING  Incomplete  Resp Panel by RT-PCR (Flu A&B, Covid) Anterior Nasal Swab     Status: Abnormal   Collection Time: 11/18/21 11:40 PM   Specimen: Anterior Nasal Swab  Result Value Ref Range Status   SARS Coronavirus 2 by RT PCR POSITIVE (A) NEGATIVE Final    Comment: (NOTE) SARS-CoV-2 target nucleic acids are DETECTED.  The SARS-CoV-2 RNA is generally detectable in upper respiratory specimens during the acute phase of infection. Positive results are indicative of the presence of the identified virus, but do not rule out bacterial infection or co-infection with other pathogens not detected by the test. Clinical correlation with patient history and other diagnostic information is necessary to determine patient infection status. The expected result is Negative.  Fact Sheet for Patients: EntrepreneurPulse.com.au  Fact Sheet for Healthcare Providers: IncredibleEmployment.be  This test is not yet approved or cleared by the [place] FDA and  has been authorized for detection and/or diagnosis of SARS-CoV-2 by FDA under an Emergency Use Authorization (EUA).  This EUA will remain in effect (meaning this test can be used) for the duration of  the COVID-19 declaration under Section 564(b)(1) of the A ct, 21 U.S.C.  section 360bbb-3(b)(1), unless the authorization is terminated or revoked sooner.     Influenza A by PCR NEGATIVE NEGATIVE Final   Influenza B by PCR NEGATIVE NEGATIVE Final    Comment: (NOTE) The Xpert Xpress SARS-CoV-2/FLU/RSV plus assay is intended as an aid in the diagnosis of influenza from Nasopharyngeal swab specimens and should not be used as a sole basis for treatment. Nasal washings and aspirates are unacceptable for Xpert Xpress SARS-CoV-2/FLU/RSV testing.  Fact Sheet for Patients: EntrepreneurPulse.com.au  Fact Sheet for Healthcare Providers: IncredibleEmployment.be  This test is not yet approved or cleared by the [place] FDA and has been authorized for detection and/or diagnosis of SARS-CoV-2 by FDA under an Emergency Use Authorization (EUA). This EUA will remain in effect (meaning this test can be used) for the duration of the COVID-19 declaration under Section 564(b)(1) of the Act, 21 U.S.C. section 360bbb-3(b)(1), unless the authorization is terminated or revoked.  Performed at [facility], [address] [place], [place] [address]   Blood Culture (routine x 2)     Status: None (Preliminary result)   Collection Time: 11/18/21 11:40 PM   Specimen: BLOOD  Result Value Ref Range Status   Specimen Description BLOOD LEFT ANTECUBITAL  Final   Special Requests   Final    BOTTLES DRAWN AEROBIC AND ANAEROBIC Blood Culture results may not be optimal due to an inadequate volume of blood received in culture bottles   Culture   Final    NO GROWTH < 12 HOURS Performed at [facility], [address] [place],  [address]    Report Status PENDING  Incomplete    Imaging: I independently reviewed the patient's CT scan demonstrating at least locally advanced prostate cancer with distended/full bladder.  I discussed the findings with the patient and his family.  Procedure: Informed consent was obtained  verbally with the patient and his family regarding placing a catheter and the associated risks and benefits.  I then prepped and draped him in the routine fashion.  I then used a 32 [place] coud tipped Foley  catheter and was able to get the catheter into the patient's bladder, but this was difficult as I was able to appreciate very tight prostatic urethra and the catheter was initially curling with in it.  Ultimately, was able to get into his bladder and clear yellow urine noted.  I did irrigate the catheter to ensure that it irrigated well.  When I inflated the balloon with 10 cc of sterile water there was no additional pain or discomfort.  There was no additional blood per urethra.  As such, I am hopeful that it truly is in the right position.  I was able to irrigate and obtain a sterile urine specimen for culture.  Imp/Recommendations: The patient was admitted for urosepsis secondary to incomplete bladder emptying.  He was taught CIC yesterday in the urology clinic, but given the significant prostatic obstruction and progression of his cancer locally, this is not likely going to work.  He likely will need an indwelling Foley catheter moving forward.  It may also be reasonable to consider a suprapubic tube in the future.  Fortunately, there is no significant gross hematuria today.  Suggest that the patient follow-up with urology sometime next week for further discussion and management.   Ardis Hughs

## 2021-11-19 NOTE — ED Provider Notes (Signed)
-----------------------------------------   1:24 AM on 11/19/2021 -----------------------------------------   Updated patient and spouse on UA results.  He is having rigors; rechecked oral temperature 98.5 F.  Also complains of difficulty breathing with wheezing.  Room air saturation 96%; will place on 2 L nasal cannula oxygen for comfort.  Administer breathing treatment.  Will consult hospital services for evaluation and admission.  ----------------------------------------- 2:55 AM on 11/19/2021 -----------------------------------------   Bladder scan 258.  Patient resting more comfortably after DuoNebs and IV Solu-Medrol.  Repeat chest x-ray demonstrates slightly worsened opacities which may demonstrate edema versus infection.  Since patient was not hypoxic and oxygen placed for symptomatic support, and elevated lactate concerning for sepsis, will not add IV diuretic at this time.   ----------------------------------------- 4:00 AM on 11/19/2021 -----------------------------------------   COVID is positive.  I have informed the hospitalist.  Sepsis reassessment initially lactic acid worsened then improved; most recent lactic acid is 1.9.   Paulette Blanch, MD 11/19/21 [id number]

## 2021-11-19 NOTE — Assessment & Plan Note (Signed)
Likely vasovagal syncope secondary to acute infection with sepsis Continue management of sepsis above Neurologic checks, continuous cardiac monitoring

## 2021-11-19 NOTE — H&P (Addendum)
History and Physical    Patient: Michael [name] WCH:[id number] DOB: 1933/07/09 DOA: 11/18/2021 DOS: the patient was seen and examined on 11/19/2021 PCP: [name], MD  Patient coming from: Home  Chief Complaint:  Chief Complaint  Patient presents with   Loss of Consciousness    HPI: Michael [name] is a 88 y.o. 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  History is given mostly by daughter at bedside who stated that patient who had been coughing since COVID back in August appeared to have worsening cough congestion and wheezing over the past 3 days.  He was otherwise in his usual state of health and went to his urologist earlier in the day when he was being taught to self catheterize, during the instruction he developed hematuria which continued for several hours after he went home.  Later on when being helped to the bathroom he slumped down became diaphoretic and lost consciousness.  After regaining consciousness he complained of low back pain and pain in his penis.  He otherwise had no nausea or vomiting, abdominal pain or diarrhea. ED course and data review: Tmax 98.3 with pulse 1 10-1 51, respirations up to 37.  BP initially soft on arrival at 107/62, fluid responsive but then became hypertensive to 195/98.  O2 sat 98-100 on room air., EKG, personally viewed and interpreted showing sinus tachycardia at 114 with no acute ST-T wave changes.  CT chest abdomen and pelvis with contrast with the following findings IMPRESSION: 1. Diffuse bladder wall thickening and possible urothelial hyperenhancement of the ureters. Recommend  correlation with urinalysis for cystitis. 2. Wall thickening about the rectum suggestive of proctitis. 3. Increased size of the focal anterior midline bladder wall mass suspicious for metastasis versus primary neoplasm. 4. Nonspecific patchy ground-glass and centrilobular micro nodularity with upper lobe predominance is likely infectious/inflammatory not significantly changed from prior. 5. Similar to slight increase in indeterminate mediastinal and left hilar adenopathy. 6. Widespread osseous metastases. 7. Coronary artery and Aortic Atherosclerosis (ICD10-I70.0).   Patient treated with sepsis fluids and IV cefepime.  Multiple was also administered DuoNebs and methylprednisolone in the ED.  Hospitalist consulted for admission.     Past Medical History:  Diagnosis Date   Anemia    iron deficiency   Aortic atherosclerosis (HCC)    Bilateral carotid artery disease (HCC)    Mild plaque formation without obstructive disease noted on carotid Doppler.   Diabetes mellitus without complication ([place])    Essential hypertension    GERD (gastroesophageal reflux disease)    Hyperlipidemia    Hypertension    Left carotid artery stenosis    Nonobstructive Coronary artery disease    a. Previous cardiac catheterization at [facility] in 2010. The patient was told about 2 blockages which did not require revascularization; b. 01/2015 Cath: LM nl, LAD 10p/m, 20md, D2 30ost, LCX nl, RCA min irregs, EF 55-65%; b. 12/2018 MV: No ischemia/infarct. CT images w/ mod 3 vessel Cor Ca2+ and mild-mod Ao atherosclerosis.   [place]   Raynaud disease    a. Managed w/ nifedipine.   Past Surgical History:  Procedure Laterality Date   CARDIAC CATHETERIZATION  2010   CARDIAC CATHETERIZATION N/A  02/03/2015   Procedure: Left Heart Cath and Coronary Angiography;  Surgeon: Wellington [name], MD;  Location: [facility];  Service: Cardiovascular;  Laterality: N/A;   CATARACT EXTRACTION Bilateral     CYSTOSCOPY W/ RETROGRADES Bilateral 04/11/2017   Procedure: CYSTOSCOPY WITH RETROGRADE PYELOGRAM;  Surgeon: [name], MD;  Location: [facility];  Service: Urology;  Laterality: Bilateral;   CYSTOSCOPY W/ RETROGRADES Bilateral 09/12/2017   Procedure: CYSTOSCOPY WITH RETROGRADE PYELOGRAM;  Surgeon: [name], MD;  Location: [facility];  Service: Urology;  Laterality: Bilateral;   CYSTOSCOPY W/ RETROGRADES Bilateral 02/25/2018   Procedure: CYSTOSCOPY WITH RETROGRADE PYELOGRAM;  Surgeon: [name], MD;  Location: [facility];  Service: Urology;  Laterality: Bilateral;   CYSTOSCOPY W/ URETERAL STENT PLACEMENT Bilateral 09/12/2017   Procedure: CYSTOSCOPY WITH STENT REPLACEMENT;  Surgeon: [name], MD;  Location: [facility];  Service: Urology;  Laterality: Bilateral;   CYSTOSCOPY W/ URETERAL STENT REMOVAL Bilateral 02/25/2018   Procedure: CYSTOSCOPY WITH STENT REMOVAL;  Surgeon: [name], MD;  Location: [facility];  Service: Urology;  Laterality: Bilateral;   CYSTOSCOPY WITH STENT PLACEMENT Left 04/11/2017   Procedure: CYSTOSCOPY WITH STENT PLACEMENT and fulgeration;  Surgeon: [name], MD;  Location: [facility];  Service: Urology;  Laterality: Left;   CYSTOSCOPY WITH URETHRAL DILATATION Bilateral 04/11/2017   Procedure: CYSTOSCOPY WITH URETHRAL DILATATION;  Surgeon: [name], MD;  Location: [facility];  Service: Urology;  Laterality: Bilateral;   IR CONVERT RIGHT NEPHROSTOMY TO NEPHROURETERAL CATH  05/21/2017   IR NEPHROSTOMY PLACEMENT RIGHT  04/13/2017   Social History:  reports that he has never smoked. He has never used smokeless tobacco. He reports that he does not drink alcohol and does not use drugs.  No Known Allergies  Family History  Problem Relation Age of Onset   Hypertension Father     Prior to Admission medications   Medication Sig Start Date End Date Taking? Authorizing Provider  acetaminophen (TYLENOL) 500 MG tablet Take 500 mg by mouth every 4 (four)  hours as needed for moderate pain or fever.    [provider]  albuterol (PROVENTIL HFA;VENTOLIN HFA) 108 (90 Base) MCG/ACT inhaler Inhale 2 puffs into the lungs every 4 (four) hours as needed for wheezing or shortness of breath.    [provider]  atorvastatin (LIPITOR) 10 MG tablet Take 10 mg by mouth daily at 6 PM.     [provider]  benzonatate (TESSALON) 100 MG capsule Take by mouth 3 (three) times daily as needed for cough.    [provider]  Calcium Carb-Cholecalciferol (CALCIUM 600+D3) 600-800 MG-UNIT TABS Take 1 tablet by mouth every evening.     [provider]  dexamethasone (DECADRON) 2 MG tablet Take 1 tablet (2 mg total) by mouth 2 (two) times daily with a meal. 08/08/21   Chrystal, Eulas Post, MD  enzalutamide Gillermina Phy) 40 MG tablet Take 2 tablets (80 mg total) by mouth daily. 10/31/21   [name], MD  glimepiride (AMARYL) 2 MG tablet Take 2 mg by mouth daily with breakfast.    [provider]  glucose blood (ACCU-CHEK GUIDE) test strip Use asdirected three times a day diag E11.65 04/19/17   [name], MD  ibuprofen (ADVIL,MOTRIN) 200 MG tablet Take 200 mg by mouth every 6 (six) hours as needed for fever or moderate pain.     [provider]  iron polysaccharides (NIFEREX) 150 MG capsule Take 150 mg by mouth daily.  12/28/17 01/03/23  [provider]  lisinopril (ZESTRIL) 2.5 MG tablet Take 2.5 mg by mouth daily. 01/22/20   [provider]  metFORMIN (GLUCOPHAGE) 1000 MG tablet Take 1,000 mg by mouth 2 (two) times daily. 12/07/19   [provider]  metoprolol succinate (TOPROL-XL) 25 MG 24 hr tablet TAKE 1 TABLET BY MOUTH EVERY DAY FOR BLOOD PRESSURE 11/09/17   [name], MD  Multiple Vitamins-Minerals (MULTIVITAMIN WITH MINERALS) tablet Take 1 tablet by mouth daily.     [provider]  MYRBETRIQ 25 MG TB24 tablet TAKE 1 TABLET (25 MG TOTAL) BY MOUTH DAILY. 08/01/21   [name], MD  NIFEdipine (ADALAT CC) 30 MG 24 hr tablet TAKE 1 TABLET BY MOUTH EVERY DAY 04/05/21   Kris Hartmann, NP  omeprazole (PRILOSEC) 20 MG capsule TAKE ONE CAPSULE BY MOUTH EVERY DAY 07/02/17   [name], MD  oxyCODONE (OXY IR/ROXICODONE) 5 MG immediate release tablet Take 1 tablet (5 mg total) by mouth every 4 (four) hours as needed for severe pain. 08/05/21   Borders, Kirt Boys, NP  sitaGLIPtin (JANUVIA) 100 MG tablet Take 100 mg by mouth daily.    [provider]  sucralfate (CARAFATE) 1 g tablet TAKE 1 TABLET (1 G TOTAL) BY MOUTH 4 TIMES A DAY WITH MEALS AND AT BEDTIME 09/14/21   Hughie Closs, [place]    Physical Exam: Vitals:   11/19/21 0115 11/19/21 0130 11/19/21 0200 11/19/21 0215  BP:  (!) 136/118 (!) 195/98   Pulse: (!) 140 (!) 118 (!) 151 (!) 143  Resp: (!) 33 (!) 36 (!) 37 (!) 30  Temp:   98.5 F (36.9 C)   TempSrc:   Oral   SpO2: 97% 99% 100% 100%   Physical Exam Vitals and nursing note reviewed.  Constitutional:      General: He is not in acute distress.    Appearance: He is ill-appearing.  HENT:     Head: Normocephalic and atraumatic.  Cardiovascular:     Rate and Rhythm: Regular rhythm. Tachycardia present.     Heart sounds: Normal heart sounds.  Pulmonary:     Effort: Pulmonary effort is normal. Tachypnea present.     Breath sounds: Wheezing present.  Abdominal:     Palpations: Abdomen is soft.     Tenderness: There is no abdominal tenderness.  Neurological:     Mental Status: Mental status is at baseline. He is lethargic.     Labs on Admission: I have personally reviewed following labs and imaging studies  CBC: Recent Labs  Lab 11/18/21 1937  WBC 3.9*  NEUTROABS 3.7  HGB 10.1*  HCT 31.4*  MCV 95.2  PLT 630   Basic Metabolic Panel: Recent Labs  Lab 11/18/21 2024  NA 134*  K 3.9  CL 103  CO2 20*  GLUCOSE 184*  BUN 19  CREATININE 0.95  CALCIUM 8.8*   GFR: CrCl cannot be calculated (Unknown ideal weight.). Liver  Function Tests: Recent Labs  Lab 11/18/21 2024  AST 65*  ALT 27  ALKPHOS 72  BILITOT 0.7  PROT 7.2  ALBUMIN 3.4*   Recent Labs  Lab 11/18/21 2024  LIPASE 31   No results for input(s): "AMMONIA" in the last 168 hours. Coagulation Profile: Recent Labs  Lab 11/18/21 2340  INR 1.3*   Cardiac Enzymes: No results for input(s): "CKTOTAL", "CKMB", "CKMBINDEX", "TROPONINI" in the last 168 hours. BNP (last 3 results) No results for input(s): "PROBNP" in the last 8760 hours. HbA1C: No results for  input(s): "HGBA1C" in the last 72 hours. CBG: No results for input(s): "GLUCAP" in the last 168 hours. Lipid Profile: No results for input(s): "CHOL", "HDL", "LDLCALC", "TRIG", "CHOLHDL", "LDLDIRECT" in the last 72 hours. Thyroid Function Tests: No results for input(s): "TSH", "T4TOTAL", "FREET4", "T3FREE", "THYROIDAB" in the last 72 hours. Anemia Panel: No results for input(s): "VITAMINB12", "FOLATE", "FERRITIN", "TIBC", "IRON", "RETICCTPCT" in the last 72 hours. Urine analysis:    Component Value Date/Time   COLORURINE AMBER (A) 11/18/2021 2340   APPEARANCEUR CLOUDY (A) 11/18/2021 2340   APPEARANCEUR Cloudy (A) 10/08/2018 1453   LABSPEC 1.033 (H) 11/18/2021 2340   PHURINE 6.0 11/18/2021 2340   GLUCOSEU 50 (A) 11/18/2021 2340   HGBUR MODERATE (A) 11/18/2021 2340   BILIRUBINUR NEGATIVE 11/18/2021 2340   BILIRUBINUR Negative 10/08/2018 1453   KETONESUR 5 (A) 11/18/2021 2340   PROTEINUR 100 (A) 11/18/2021 2340   UROBILINOGEN 0.2 04/05/2017 1214   NITRITE POSITIVE (A) 11/18/2021 2340   LEUKOCYTESUR SMALL (A) 11/18/2021 2340    Radiological Exams on Admission: DG Chest Port 1 View  Result Date: 11/19/2021 CLINICAL DATA:  Questionable sepsis; evaluate for abnormality EXAM: PORTABLE CHEST 1 VIEW COMPARISON:  CT earlier today and radiographs 02/25/2018 FINDINGS: Stable cardiomediastinal silhouette. Increased hazy airspace and interstitial opacities bilaterally are nonspecific and may  be due to infection or edema. No pleural effusion or pneumothorax. No acute osseous abnormality. Sclerotic osseous metastases better appreciated on CT. IMPRESSION: Increased hazy airspace and interstitial opacities bilaterally are nonspecific and may be due to infection or edema. Electronically Signed   By: Placido Sou M.D.   On: 11/19/2021 00:12   CT CHEST ABDOMEN PELVIS W CONTRAST  Result Date: 11/19/2021 CLINICAL DATA:  Syncopal episode; sepsis EXAM: CT CHEST, ABDOMEN, AND PELVIS WITH CONTRAST TECHNIQUE: Multidetector CT imaging of the chest, abdomen and pelvis was performed following the standard protocol during bolus administration of intravenous contrast. RADIATION DOSE REDUCTION: This exam was performed according to the departmental dose-optimization program which includes automated exposure control, adjustment of the mA and/or kV according to patient size and/or use of iterative reconstruction technique. CONTRAST:  171m OMNIPAQUE IOHEXOL 300 MG/ML  SOLN COMPARISON:  CT 10/03/2021 FINDINGS: CT CHEST FINDINGS Cardiovascular: Coronary artery disease. Aortic atherosclerosis. Normal heart size. No pericardial effusion. Mediastinum/Nodes: Enlarged mediastinal and left hilar lymph nodes are similar to slightly increased in size from 10/03/2021. The 12 mm subcarinal node is unchanged. Slight increase in size of left hilar nodes each measuring 9 mm. Thyroid gland, trachea, and esophagus demonstrate no significant findings. Lungs/Pleura: Mild emphysematous changes. No focal consolidation. Widespread patchy ground-glass and centrilobular micro nodularity with an upper lobe predominance. This is unchanged from prior. Mild interlobular septal thickening in the upper lobes. No pleural effusion or pneumothorax. Musculoskeletal: Diffuse sclerotic metastases in the thoracic spine, scapula, ribs, and sternum. CT ABDOMEN PELVIS FINDINGS Hepatobiliary: No focal liver abnormality is seen. No gallstones, gallbladder wall  thickening, or biliary dilatation. Pancreas: Unremarkable. No pancreatic ductal dilatation or surrounding inflammatory changes. Spleen: No splenic injury or perisplenic hematoma. Adrenals/Urinary Tract: Unchanged thickening of the left adrenal gland. Normal right adrenal gland. Small hypoattenuating lesions statistically likely to represent cysts. No follow-up is required. Focal areas of cortical renal scarring bilaterally. Unchanged mild bilateral hydroureter and prominent renal pelvises. Question urothelial hyperenhancement of the ureters. Diffuse bladder wall thickening. Increased size of the nodular thickening in the anterior bladder wall measuring 1.2 x 0.9 cm, previously 1.6 x 0.9 cm. Stomach/Bowel: Stomach is within normal limits. Appendix appears normal.  Question bowel wall thickening about the rectum with adjacent stranding. There are few prominent loops of jejunum at the upper limits of normal. No definite obstruction. Vascular/Lymphatic: Aortic atherosclerosis. No enlarged abdominal or pelvic lymph nodes. Reproductive: Unremarkable. Other: No free intraperitoneal fluid or air. Musculoskeletal: Diffuse osseous metastases throughout the lumbar spine, sacrum, and pelvis. Bilateral L5 pars defects and grade 1 anterolisthesis of L5. No acute abnormality. IMPRESSION: 1. Diffuse bladder wall thickening and possible urothelial hyperenhancement of the ureters. Recommend correlation with urinalysis for cystitis. 2. Wall thickening about the rectum suggestive of proctitis. 3. Increased size of the focal anterior midline bladder wall mass suspicious for metastasis versus primary neoplasm. 4. Nonspecific patchy ground-glass and centrilobular micro nodularity with upper lobe predominance is likely infectious/inflammatory not significantly changed from prior. 5. Similar to slight increase in indeterminate mediastinal and left hilar adenopathy. 6. Widespread osseous metastases. 7. Coronary artery and Aortic  Atherosclerosis (ICD10-I70.0). Electronically Signed   By: Placido Sou M.D.   On: 11/19/2021 00:01     Data Reviewed: Relevant notes from primary care and specialist visits, past discharge summaries as available in EHR, including Care Everywhere. Prior diagnostic testing as pertinent to current admission diagnoses Updated medications and problem lists for reconciliation ED course, including vitals, labs, imaging, treatment and response to treatment Triage notes, nursing and pharmacy notes and ED provider's notes Notable results as noted in HPI   Assessment and Plan: * Sepsis secondary to complicated UTI [facility]) Bladder mass secondary to metastatic prostate cancer Self-catheterization due to urinary retention Sepsis fluids IV cefepime Foley catheter Follow cultures Consider urology consult  ABLA (acute blood loss anemia) Hematuria secondary to traumatic catheterization and bladder mass Secondary to hematuria and in part delusional Will type cross and transfuse 1 unit PRBC Urology consulted  Pneumonia due to COVID-19 virus Possible bacterial superinfection Patient had COVID back in August and had been coughing since then but recently developed congestion Possibility of residual COVID positivity from COVID infection in August Get procalcitonin to evaluate for possible bacterial infection Airborne precautions Cefepime, Azithromycin.  After discussion with daughter, will also treat with Paxlovid Supportive care supplementaql oxygen   Syncope and collapse Likely vasovagal syncope secondary to acute infection with sepsis Continue management of sepsis above Neurologic checks, continuous cardiac monitoring  Type 2 diabetes mellitus without complication, with long-term current use of insulin (HCC) Sliding scale insulin coverage  Coronary artery disease involving native coronary artery of native heart without angina pectoris Continue metoprolol.  Continue atorvastatin.  Not  currently on aspirin  Essential hypertension Hold antihypertensives due to soft blood pressures on arrival        DVT prophylaxis: SCD   Consults: none  Advance Care Planning:   Code Status: Prior   Family Communication: Daughter at bedside  Disposition Plan: Back to previous home environment  Severity of Illness: The appropriate patient status for this patient is INPATIENT. Inpatient status is judged to be reasonable and necessary in order to provide the required intensity of service to ensure the patient's safety. The patient's presenting symptoms, physical exam findings, and initial radiographic and laboratory data in the context of their chronic comorbidities is felt to place them at high risk for further clinical deterioration. Furthermore, it is not anticipated that the patient will be medically stable for discharge from the hospital within 2 midnights of admission.   * I certify that at the point of admission it is my clinical judgment that the patient will require inpatient hospital care spanning beyond 2  midnights from the point of admission due to high intensity of service, high risk for further deterioration and high frequency of surveillance required.*  Author: Athena Masse, MD 11/19/2021 2:28 AM  For on call review [email or web address].

## 2021-11-19 NOTE — Progress Notes (Signed)
Pharmacy Antibiotic Note  Michael [name] is a 88 y.o. male admitted on 11/18/2021 with UTI.  Pharmacy has been consulted for Cefepime dosing.  Plan: Cefepime 2 gm IV X 1 given in ED on 9/30 @ 0002. Cefepime 2 gm IV Q8H ordered to continue 9/30 @ 0800.      Temp (24hrs), Avg:98.4 F (36.9 C), Min:98.3 F (36.8 C), Max:98.5 F (36.9 C)  Recent Labs  Lab 11/18/21 1937 11/18/21 2024 11/18/21 2340  WBC 3.9*  --   --   CREATININE  --  0.95  --   LATICACIDVEN  --   --  2.6*    CrCl cannot be calculated (Unknown ideal weight.).    No Known Allergies  Antimicrobials this admission:   >>    >>   Dose adjustments this admission:   Microbiology results:  BCx:   UCx:    Sputum:    MRSA PCR:   Thank you for allowing pharmacy to be a part of this patient's care.  Crystel Demarco D 11/19/2021 3:00 AM

## 2021-11-19 NOTE — Sepsis Progress Note (Signed)
Notified provider of need to order repeat lactic acid since 2nd is trending up.

## 2021-11-19 NOTE — ED Notes (Signed)
Informed RN bed assigned 

## 2021-11-19 NOTE — Assessment & Plan Note (Addendum)
Hematuria secondary to traumatic catheterization and bladder mass with resultant hematuria.  Transfused 1 unit packed red blood cells initially.

## 2021-11-19 NOTE — Progress Notes (Signed)
PHARMACY - PHYSICIAN COMMUNICATION CRITICAL VALUE ALERT - BLOOD CULTURE IDENTIFICATION (BCID)  Michael [name] is an 88 y.o. male who presented to [facility] on 11/18/2021 with a chief complaint of syncope   Assessment:  1/4 (aerobic) GNR: E. Coli ESBL detected, suspected urinary source    Name of physician (or Provider) Contacted: Dr. Sharen Hones, MD  Current antibiotics: cefepime + azithromycin  Changes to prescribed antibiotics recommended: change cefepime to meropenem  Recommendations accepted by provider  Results for orders placed or performed during the hospital encounter of 11/18/21  Blood Culture ID Panel (Reflexed) (Collected: 11/18/2021 11:40 PM)  Result Value Ref Range   Enterococcus faecalis NOT DETECTED NOT DETECTED   Enterococcus Faecium NOT DETECTED NOT DETECTED   Listeria monocytogenes NOT DETECTED NOT DETECTED   Staphylococcus species NOT DETECTED NOT DETECTED   Staphylococcus aureus (BCID) NOT DETECTED NOT DETECTED   Staphylococcus epidermidis NOT DETECTED NOT DETECTED   Staphylococcus lugdunensis NOT DETECTED NOT DETECTED   Streptococcus species NOT DETECTED NOT DETECTED   Streptococcus agalactiae NOT DETECTED NOT DETECTED   Streptococcus pneumoniae NOT DETECTED NOT DETECTED   Streptococcus pyogenes NOT DETECTED NOT DETECTED   A.calcoaceticus-baumannii NOT DETECTED NOT DETECTED   Bacteroides fragilis NOT DETECTED NOT DETECTED   Enterobacterales DETECTED (A) NOT DETECTED   Enterobacter cloacae complex NOT DETECTED NOT DETECTED   Escherichia coli DETECTED (A) NOT DETECTED   Klebsiella aerogenes NOT DETECTED NOT DETECTED   Klebsiella oxytoca NOT DETECTED NOT DETECTED   Klebsiella pneumoniae NOT DETECTED NOT DETECTED   Proteus species NOT DETECTED NOT DETECTED   Salmonella species NOT DETECTED NOT DETECTED   Serratia marcescens NOT DETECTED NOT DETECTED   Haemophilus influenzae NOT DETECTED NOT DETECTED   Neisseria meningitidis NOT DETECTED NOT DETECTED    Pseudomonas aeruginosa NOT DETECTED NOT DETECTED   Stenotrophomonas maltophilia NOT DETECTED NOT DETECTED   Candida albicans NOT DETECTED NOT DETECTED   Candida auris NOT DETECTED NOT DETECTED   Candida glabrata NOT DETECTED NOT DETECTED   Candida krusei NOT DETECTED NOT DETECTED   Candida parapsilosis NOT DETECTED NOT DETECTED   Candida tropicalis NOT DETECTED NOT DETECTED   Cryptococcus neoformans/gattii NOT DETECTED NOT DETECTED   CTX-M ESBL DETECTED (A) NOT DETECTED   Carbapenem resistance IMP NOT DETECTED NOT DETECTED   Carbapenem resistance KPC NOT DETECTED NOT DETECTED   Carbapenem resistance NDM NOT DETECTED NOT DETECTED   Carbapenem resist OXA 48 LIKE NOT DETECTED NOT DETECTED   Carbapenem resistance VIM NOT DETECTED NOT [place], PharmD, BCPS Clinical Pharmacist   11/19/2021  1:43 PM

## 2021-11-19 NOTE — Assessment & Plan Note (Addendum)
Possible bacterial superinfection Patient had COVID back in August and had been coughing since then but recently developed congestion Possibility of residual COVID positivity from COVID infection in August Get procalcitonin to evaluate for possible bacterial infection Airborne precautions Cefepime, Azithromycin.  After discussion with daughter, will also treat with Paxlovid Supportive care supplementaql oxygen

## 2021-11-19 NOTE — Assessment & Plan Note (Addendum)
CBGs elevated in part due to steroids.  On sliding scale plus normally on oral Actos, metformin and glipizide.  A1c notes good control at 7.2.  Started on low-dose Lantus while on steroids.  Since she is going home on quick prednisone taper, can likely discontinue Lantus upon discharge.

## 2021-11-19 NOTE — Progress Notes (Signed)
Pt and family was educated about s/s of blood transfusion reaction. VSS. Blood was started at [facility]

## 2021-11-19 NOTE — Assessment & Plan Note (Deleted)
Suspect secondary to bladder mass with traumatic catheterization Serial H&H and transfuse if necessary Consider urology consult

## 2021-11-19 NOTE — Progress Notes (Signed)
Progress Note   Patient: Michael [name] [id number] DOB: 11-01-1933 DOA: 11/18/2021     0 DOS: the patient was seen and examined on 11/19/2021   Brief hospital course: GARDINER ESPANA is a 88 y.o. 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 Patient also had 3 days of gross hematuria after Foley cath was anchored in the doctor's office. Upon arriving the hospital, patient had hypotension, tachycardia and tachypnea. Lab test showed possible COVID.  Sodium 134, bicarb 20, renal function normal.  Leukocytosis of 13.7, hemoglobin 8.3. Patient blood culture came back with E. coli ESBL. Foley catheter was anchored by urology for urinary retention. Patient received IV fluid bolus, antibiotic changed to meropenem. CT scan showed evidence of cystitis, proctitis, chronic loss changes in the lungs consistent with viral pneumonia.  Assessment and Plan: * Severe sepsis ([place]) Septicemia secondary to E. coli ESBL secondary to urinary tract infection. E. coli urinary tract infection. Bladder mass secondary to metastatic prostate cancer Urinary tract infection status post Foley catheter. Patient met severe sepsis criteria with tachycardia, tachypnea leukocytosis, significant lactic acidosis and hypotension and hypoxia. Patient received IV fluid bolus, will continue fluids for now. Blood culture already positive for E. coli ESBL.  Continue meropenem.  Follow-up with blood culture results and susceptibility. Patient condition currently is critical.  Pneumonia due to COVID-19 virus Acute hypoxemic respiratory failure secondary to severe  sepsis. Bacterial pneumonia ruled out. I reviewed patient chest CT scan images, consistent with COVID-pneumonia.  Patient does not have any consolidation, no evidence of bacterial pneumonia. Patient also developed hypoxemia with oxygen saturation dropped down to 91%.  Put on 2 L oxygen. We will continue IV steroids, Paxlovid.  Gross hematuria secondary to Foley catheter. Iron deficient anemia. Acute blood loss anemia. Patient be giving IV iron, monitor hemoglobin and transfuse as needed.  Gross hematuria has improved after Foley catheter is placed by urology.  Syncope and collapse. Likely vasovagal. Continue to monitor  Type 2 diabetes Continue sliding scale insulin.  Coronary disease pain Essential hypertension. Hold off blood pressure medicines.  Given patient multiple medical conditions, patient condition currently is critical.     Subjective:  Patient still requiring 2 L oxygen, saturation much better. Short of breath with exertion. No abdominal pain nausea vomiting.  Physical Exam: Vitals:   11/19/21 1045 11/19/21 1203 11/19/21 1300 11/19/21 1345  BP: (!) 89/60 104/68  109/69  Pulse:  (!) 103  100  Resp:  (!) 24  (!) 24  Temp:  97.8 F (36.6 C)    TempSrc:  Oral    SpO2:  100%  100%  Weight:   54.9 kg    General exam: Appears calm and comfortable  Respiratory system: Clear to auscultation. Respiratory effort normal. Cardiovascular system: S1 & S2 heard, RRR. No JVD, murmurs, rubs, gallops or clicks. No pedal edema. Gastrointestinal system: Abdomen is nondistended, soft and nontender. No organomegaly or masses felt. Normal bowel sounds heard. Central nervous system: Alert and oriented. No focal neurological deficits. Extremities: Symmetric 5 x 5 power. Skin: No rashes, lesions or ulcers Psychiatry: Judgement and insight appear normal. Mood & affect appropriate.   Data Reviewed:  Reviewed the CT scan images and results,  reviewed all lab results.  Family  Communication: Daughter updated at bedside.  Disposition: Status is: Inpatient Remains inpatient appropriate because: Severity of disease.  Multiple IV treatment.  Planned Discharge Destination: Home with Home Health    Time spent: 75 minutes    Author: Sharen Hones, MD 11/19/2021 3:39 PM  For on call review [email or web address].

## 2021-11-19 NOTE — Assessment & Plan Note (Addendum)
Patient met criteria for severe sepsis on admission secondary to lactic acidosis, tachycardia, tachypnea and urinary source with positive blood cultures for ESBL E. coli.  On IV meropenem which he will need until 10/10.  Sepsis since stabilized.  PICC line placed.

## 2021-11-19 NOTE — Assessment & Plan Note (Addendum)
Continue metoprolol.  Continue atorvastatin.  Not currently on aspirin

## 2021-11-19 NOTE — Progress Notes (Signed)
CODE SEPSIS - PHARMACY COMMUNICATION  **Broad Spectrum Antibiotics should be administered within 1 hour of Sepsis diagnosis**  Time Code Sepsis Called/Page Received: 9/29 @ 2310   Antibiotics Ordered: Cefepime 2 gm   Time of 1st antibiotic administration: Cefepime 2 gm IV X 1 on 9/30 @ 0002  Additional action taken by pharmacy:   If necessary, Name of Provider/Nurse Contacted:     Hershall Benkert D ,PharmD Clinical Pharmacist  11/19/2021  12:57 AM

## 2021-11-19 NOTE — Sepsis Progress Note (Signed)
Notified bedside nurse of need to draw repeat lactic acid. 

## 2021-11-19 NOTE — Sepsis Progress Note (Signed)
Elink following Code Sepsis. 

## 2021-11-20 ENCOUNTER — Inpatient Hospital Stay (HOSPITAL_COMMUNITY)
Admit: 2021-11-20 | Discharge: 2021-11-20 | Disposition: A | Discharge status: Home / Self Care | Payer: Medicare HMO | Attending: Internal Medicine | Admitting: Internal Medicine

## 2021-11-20 ENCOUNTER — Inpatient Hospital Stay: Payer: Medicare HMO

## 2021-11-20 DIAGNOSIS — J449 Chronic obstructive pulmonary disease, unspecified: Secondary | ICD-10-CM | POA: Diagnosis not present

## 2021-11-20 DIAGNOSIS — U071 COVID-19: Secondary | ICD-10-CM | POA: Diagnosis not present

## 2021-11-20 DIAGNOSIS — D62 Acute posthemorrhagic anemia: Secondary | ICD-10-CM | POA: Diagnosis not present

## 2021-11-20 DIAGNOSIS — I5031 Acute diastolic (congestive) heart failure: Secondary | ICD-10-CM

## 2021-11-20 DIAGNOSIS — I5033 Acute on chronic diastolic (congestive) heart failure: Secondary | ICD-10-CM | POA: Diagnosis not present

## 2021-11-20 DIAGNOSIS — A419 Sepsis, unspecified organism: Secondary | ICD-10-CM | POA: Diagnosis not present

## 2021-11-20 LAB — TYPE AND SCREEN
ABO/RH(D): O POS
Antibody Screen: NEGATIVE
Unit division: 0
Unit division: 0

## 2021-11-20 LAB — GLUCOSE, CAPILLARY
Glucose-Capillary: 240 mg/dL — ABNORMAL HIGH (ref 70–99)
Glucose-Capillary: 160 mg/dL — ABNORMAL HIGH (ref 70–99)
Glucose-Capillary: 276 mg/dL — ABNORMAL HIGH (ref 70–99)
Glucose-Capillary: 253 mg/dL — ABNORMAL HIGH (ref 70–99)

## 2021-11-20 LAB — CBC
HCT: 34 % — ABNORMAL LOW (ref 39.0–52.0)
Hemoglobin: 11.5 g/dL — ABNORMAL LOW (ref 13.0–17.0)
MCH: 30.5 pg (ref 26.0–34.0)
MCHC: 33.8 g/dL (ref 30.0–36.0)
MCV: 90.2 fL (ref 80.0–100.0)
Platelets: 161 10*3/uL (ref 150–400)
RBC: 3.77 MIL/uL — ABNORMAL LOW (ref 4.22–5.81)
RDW: 15.3 % (ref 11.5–15.5)
WBC: 16.8 10*3/uL — ABNORMAL HIGH (ref 4.0–10.5)
nRBC: 0 % (ref 0.0–0.2)

## 2021-11-20 LAB — BPAM RBC
Blood Product Expiration Date: 202311032359
Blood Product Expiration Date: 202311032359
ISSUE DATE / TIME: 202309301008
ISSUE DATE / TIME: 202309301739
Unit Type and Rh: 5100
Unit Type and Rh: 5100

## 2021-11-20 LAB — BASIC METABOLIC PANEL
Anion gap: 3 — ABNORMAL LOW (ref 5–15)
BUN: 19 mg/dL (ref 8–23)
CO2: 22 mmol/L (ref 22–32)
Calcium: 8 mg/dL — ABNORMAL LOW (ref 8.9–10.3)
Chloride: 114 mmol/L — ABNORMAL HIGH (ref 98–111)
Creatinine, Ser: 1.02 mg/dL (ref 0.61–1.24)
GFR, Estimated: >60 mL/min (ref >60)
Glucose, Bld: 177 mg/dL — ABNORMAL HIGH (ref 70–99)
Potassium: 3.8 mmol/L (ref 3.5–5.1)
Sodium: 139 mmol/L (ref 135–145)

## 2021-11-20 LAB — BRAIN NATRIURETIC PEPTIDE: B Natriuretic Peptide: 374.9 pg/mL — ABNORMAL HIGH (ref 0.0–100.0)

## 2021-11-20 LAB — MAGNESIUM: Magnesium: 1.4 mg/dL — ABNORMAL LOW (ref 1.7–2.4)

## 2021-11-20 MED ORDER — MAGNESIUM SULFATE 4 GM/100ML IV SOLN
4.0000 g | Freq: Once | INTRAVENOUS | Status: AC
  Administered 2021-11-20: 4 g via INTRAVENOUS
  Filled 2021-11-20: qty 100

## 2021-11-20 MED ORDER — SODIUM CHLORIDE 0.9 % IV SOLN
1.0000 g | Freq: Three times a day (TID) | INTRAVENOUS | Status: AC
  Administered 2021-11-20 – 2021-11-21 (×5): 1 g via INTRAVENOUS
  Filled 2021-11-20 (×5): qty 1

## 2021-11-20 MED ORDER — POLYSACCHARIDE IRON COMPLEX 150 MG PO CAPS
150.0000 mg | ORAL_CAPSULE | Freq: Every day | ORAL | Status: DC
  Administered 2021-11-20 – 2021-11-23 (×4): 150 mg via ORAL
  Filled 2021-11-20 (×4): qty 1

## 2021-11-20 MED ORDER — CHLORHEXIDINE GLUCONATE CLOTH 2 % EX PADS
6.0000 | MEDICATED_PAD | Freq: Every day | CUTANEOUS | Status: DC
  Administered 2021-11-21 – 2021-11-23 (×3): 6 via TOPICAL

## 2021-11-20 MED ORDER — FUROSEMIDE 10 MG/ML IJ SOLN
40.0000 mg | Freq: Once | INTRAMUSCULAR | Status: AC
  Administered 2021-11-20: 40 mg via INTRAVENOUS
  Filled 2021-11-20: qty 4

## 2021-11-20 MED ORDER — IPRATROPIUM-ALBUTEROL 20-100 MCG/ACT IN AERS
1.0000 | INHALATION_SPRAY | Freq: Four times a day (QID) | RESPIRATORY_TRACT | Status: DC
  Administered 2021-11-20 – 2021-11-23 (×12): 1 via RESPIRATORY_TRACT
  Filled 2021-11-20: qty 4

## 2021-11-20 NOTE — Evaluation (Signed)
Physical Therapy Evaluation Patient Details Name: Michael [name] MRN: [id number] DOB: September 30, 1933 Today's Date: 11/20/2021  History of Present Illness  Michael [name] is an 9yoM who comes to [facility] on 11/18/21 after syncope and collapse x1 minute while being assisted to BR. Workup shows hypotension, tachycardia, tachypnea, (+) UA c E. coli ESBL, PNA 2/2 COVID. 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  Imaging studies in May this year show metastatic changes to cervical and thoracic spine with areas of cord compression C7-T4, however DTR denies knowledge of any specific impairment pertaining to these findings. Pt has ABLA from attempts to place foley after [place] at urology office showed >400cc post-void retention. Pt is now s/p PRBC infusion. No prior episodes of syncope.  Clinical Impression  Pt admitted with above Dx. Pt has functional limitations due to deficits below (see "PT Problem List"). Pt's DTR provides details on baseline functional status, home setup, as pt has baseline memory impairment and has limited [place]. Pt reports to fell quite good, back to baseline. Today pt requires no physical assistance to perform his basic household mobility- will defer performance of steps to next session, but pt could easily perform entry steps at present.He has 1 flight of steps to bedroom that he typically can manage, but sometimes uses a posterior seated scoot method due to acute weakness or fatigue. Patient's performance this date reveals decreased ability, independence, and tolerance in performing all basic mobility required for performance of activities of daily living. Pt requires additional DME, close physical assistance, and cues for safe participate in mobility. Pt will benefit from skilled PT intervention to increase independence and safety with basic mobility in preparation for discharge to the  venue listed below.     No data found.      Recommendations for follow up therapy are one component of a multi-disciplinary discharge planning process, led by the attending physician.  Recommendations may be updated based on patient status, additional functional criteria and insurance authorization.  Follow Up Recommendations Home health PT      Assistance Recommended at Discharge Set up Supervision/Assistance  Patient can return home with the following  A little help with walking and/or transfers;A little help with bathing/dressing/bathroom;Assistance with cooking/housework;Assist for transportation    Equipment Recommendations None recommended by PT  Recommendations for Other Services       Functional Status Assessment Patient has had a recent decline in their functional status and demonstrates the ability to make significant improvements in function in a reasonable and predictable amount of time.     Precautions / Restrictions Precautions Precautions: Fall Restrictions Weight Bearing Restrictions: No      Mobility  Bed Mobility Overal bed mobility: Independent             General bed mobility comments: cues to attend to foley    Transfers Overall transfer level: Independent Equipment used: Rolling walker (2 wheels)                    Ambulation/Gait   Gait Distance (Feet): 120 Feet Assistive device: Rolling walker (2 wheels) Gait Pattern/deviations: WFL(Within Functional Limits)       General Gait Details: good upright posture, safe RW use, no aberrant postural sway or LOB. Pt denies pain or giddiness; intermittent episodic wheezing is audible.  Stairs            Wheelchair Mobility    Modified Rankin (Stroke  Patients Only)       Balance Overall balance assessment: Modified Independent (able to stand hands free to manage pants and inspect foley garter for adjustment)                                            Pertinent Vitals/Pain Pain Assessment Pain Assessment: No/denies pain    Home Living Family/patient expects to be discharged to:: Private residence Living Arrangements: Children Available Help at Discharge: Family;Home health (Grandson, SIL; pt alone during the day most days.) Type of Home: House Home Access: Stairs to enter Entrance Stairs-Rails: Psychiatric nurse of Steps: 6 Alternate Level Stairs-Number of Steps: 6 Home Layout: Two level Home Equipment: Conservation officer, nature (2 wheels);Shower seat - built in      Prior Function Prior Level of Function : Independent/Modified Independent             Mobility Comments: householdAMB with RW ADLs Comments: modI to supervision, some memory difficulty     Hand Dominance        Extremity/Trunk Assessment                Communication   Communication: Prefers language other than English  Cognition Arousal/Alertness: Awake/alert Behavior During Therapy: WFL for tasks assessed/performed Overall Cognitive Status: History of cognitive impairments - at baseline                                          General Comments      Exercises     Assessment/Plan    PT Assessment Patient needs continued PT services  PT Problem List Decreased strength;Decreased activity tolerance;Decreased balance;Decreased mobility       PT Treatment Interventions DME instruction;Balance training;Gait training;Stair training;Functional mobility training;Therapeutic activities;Therapeutic exercise;Patient/family education    PT Goals (Current goals can be found in the Care Plan section)  Acute Rehab PT Goals Patient Stated Goal: regain safe independence for being home alone during the day PT Goal Formulation: With family Time For Goal Achievement: 12/04/21 Potential to Achieve Goals: Good    Frequency Min 2X/week     Co-evaluation               AM-PAC PT "6 Clicks" Mobility  Outcome Measure  Help needed turning from your back to your side while in a flat bed without using bedrails?: A Little Help needed moving from lying on your back to sitting on the side of a flat bed without using bedrails?: A Little Help needed moving to and from a bed to a chair (including a wheelchair)?: A Little Help needed standing up from a chair using your arms (e.g., wheelchair or bedside chair)?: A Little Help needed to walk in hospital room?: A Little Help needed climbing 3-5 steps with a railing? : A Little 6 Click Score: 18    End of Session   Activity Tolerance: Patient tolerated treatment well;No increased pain Patient left: in bed;with call bell/phone within reach;with family/visitor present Nurse Communication: Mobility status PT Visit Diagnosis: Difficulty in walking, not elsewhere classified (R26.2);Muscle weakness (generalized) (M62.81)    Time: 1600-1630 PT Time Calculation (min) (ACUTE ONLY): 30 min   Charges:   PT Evaluation $PT Eval Moderate Complexity: 1 Mod PT Treatments $Therapeutic Activity: 8-22 mins  4:48 PM, 11/20/21 Etta Grandchild, PT, DPT Physical Therapist - [facility]  [phone] ([place])   Philisha Weinel C 11/20/2021, 4:45 PM

## 2021-11-20 NOTE — Progress Notes (Signed)
Progress Note   Patient: Michael [name] [id number] DOB: July 29, 1933 DOA: 11/18/2021     1 DOS: the patient was seen and examined on 11/20/2021   Brief hospital course: Michael [name] is a 88 y.o. 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 Patient also had 3 days of gross hematuria after Foley cath was anchored in the doctor's office. Upon arriving the hospital, patient had hypotension, tachycardia and tachypnea. Lab test showed possible COVID.  Sodium 134, bicarb 20, renal function normal.  Leukocytosis of 13.7, hemoglobin 8.3. Patient blood culture came back with E. coli ESBL. Foley catheter was anchored by urology for urinary retention. Patient received IV fluid bolus, antibiotic changed to meropenem. CT scan showed evidence of cystitis, proctitis, groundglass changes in the lungs consistent with viral pneumonia.  Assessment and Plan: Severe sepsis ([place]) Septicemia secondary to E. coli ESBL secondary to urinary tract infection. E. coli urinary tract infection. Bladder mass secondary to metastatic prostate cancer Urinary tract infection status post Foley catheter. Patient met severe sepsis criteria with tachycardia, tachypnea leukocytosis, significant lactic acidosis and hypotension and hypoxia. Patient received IV fluid bolus Blood culture already positive for E. coli ESBL.  Continue meropenem.   Urine culture has not coming back, but the patient had multiple prior urine culture positive for E. coli ESBL.  The source of bacteremia is from urinary.  Continue meropenem.   Pneumonia due to COVID-19 virus Acute hypoxemic  respiratory failure secondary to severe sepsis. Bacterial pneumonia ruled out. I reviewed patient chest CT scan images, consistent with COVID-pneumonia.  Patient does not have any consolidation, no evidence of bacterial pneumonia. Patient also developed hypoxemia with oxygen saturation dropped down to 91%.  Put on 2 L oxygen. Condition improving, off oxygen.  Continue IV steroids and Paxlovid.  Acute on chronic diastolic congestive heart failure. Patient had echocardiogram about 3 years ago with normal ejection fraction.  Patient developed short of breath today, with increased crackles in the bases, chest x-ray showed vascular congestion.  BNP moderately elevated.  Patient does not have worsening hypoxemia. Condition most likely due to volume overload after giving fluids.  I will give 1 dose of IV Lasix.  Reassess if additional doses are needed. Repeat echocardiogram.  Gross hematuria secondary to Foley catheter. Iron deficient anemia. Acute blood loss anemia. Patient received IV iron, hemoglobin went up to 11.5 from 8.3.  Continue oral iron.  Gross hematuria has improved after Foley catheter is placed by urology.   Syncope and collapse. Likely vasovagal. No arrhythmia on telemetry.   Type 2 diabetes Continue sliding scale insulin.   Coronary disease pain Essential hypertension. Hold off blood pressure medicines.        Subjective:  Patient is complaining of short of breath with exertion.  Cough, nonproductive. No additional hematuria.  Physical Exam: Vitals:   11/19/21 1945 11/20/21 0038 11/20/21 0525 11/20/21 0914  BP: 113/82 116/78 113/83 129/73  Pulse: 88 85 96 (!) 105  Resp: _0 Temp: 97.8 F (36.6 C) 97.8 F (36.6 C) 97.6 F (36.4 C) 97.9 F (36.6 C)  TempSrc: Oral Oral Oral   SpO2: 98% 98% 93% 97%  Weight:  General exam: Appears calm and comfortable  Respiratory system: Crackles in the right base.. Respiratory effort normal. Cardiovascular  system: S1 & S2 heard, RRR. No JVD, murmurs, rubs, gallops or clicks. No pedal edema. Gastrointestinal system: Abdomen is nondistended, soft and nontender. No organomegaly or masses felt. Normal bowel sounds heard. Central nervous system: Alert and oriented. No focal neurological deficits. Extremities: Symmetric 5 x 5 power. Skin: No rashes, lesions or ulcers Psychiatry: Judgement and insight appear normal. Mood & affect appropriate.    Data Reviewed:  Chest x-ray and lab results reviewed.  Family Communication: Daughter updated at the bedside.  Disposition: Status is: Inpatient Remains inpatient appropriate because: Severity of disease, IV treatment.  Planned Discharge Destination: Home with Home Health    Time spent: 55 minutes  Author: Dekui Zhang, MD 11/20/2021 12:44 PM  For on call review [email or web address].  

## 2021-11-20 NOTE — Progress Notes (Signed)
*  PRELIMINARY RESULTS* Echocardiogram 2D Echocardiogram has been performed.  [name] 11/20/2021, 2:08 PM

## 2021-11-21 ENCOUNTER — Other Ambulatory Visit: Payer: Self-pay

## 2021-11-21 DIAGNOSIS — N390 Urinary tract infection, site not specified: Secondary | ICD-10-CM

## 2021-11-21 DIAGNOSIS — Z1612 Extended spectrum beta lactamase (ESBL) resistance: Secondary | ICD-10-CM

## 2021-11-21 DIAGNOSIS — R338 Other retention of urine: Secondary | ICD-10-CM

## 2021-11-21 DIAGNOSIS — E43 Unspecified severe protein-calorie malnutrition: Secondary | ICD-10-CM

## 2021-11-21 DIAGNOSIS — R6520 Severe sepsis without septic shock: Secondary | ICD-10-CM

## 2021-11-21 DIAGNOSIS — C61 Malignant neoplasm of prostate: Secondary | ICD-10-CM

## 2021-11-21 DIAGNOSIS — B9629 Other Escherichia coli [E. coli] as the cause of diseases classified elsewhere: Secondary | ICD-10-CM

## 2021-11-21 DIAGNOSIS — A419 Sepsis, unspecified organism: Secondary | ICD-10-CM | POA: Diagnosis not present

## 2021-11-21 DIAGNOSIS — A499 Bacterial infection, unspecified: Secondary | ICD-10-CM

## 2021-11-21 DIAGNOSIS — D62 Acute posthemorrhagic anemia: Secondary | ICD-10-CM | POA: Diagnosis not present

## 2021-11-21 LAB — URINE CULTURE: Culture: >=100000 — AB

## 2021-11-21 LAB — GLUCOSE, CAPILLARY
Glucose-Capillary: 275 mg/dL — ABNORMAL HIGH (ref 70–99)
Glucose-Capillary: 265 mg/dL — ABNORMAL HIGH (ref 70–99)
Glucose-Capillary: 246 mg/dL — ABNORMAL HIGH (ref 70–99)
Glucose-Capillary: 198 mg/dL — ABNORMAL HIGH (ref 70–99)

## 2021-11-21 LAB — BASIC METABOLIC PANEL
Anion gap: 8 (ref 5–15)
BUN: 22 mg/dL (ref 8–23)
CO2: 22 mmol/L (ref 22–32)
Calcium: 7.9 mg/dL — ABNORMAL LOW (ref 8.9–10.3)
Chloride: 107 mmol/L (ref 98–111)
Creatinine, Ser: 1.05 mg/dL (ref 0.61–1.24)
GFR, Estimated: >60 mL/min (ref >60)
Glucose, Bld: 249 mg/dL — ABNORMAL HIGH (ref 70–99)
Potassium: 3.7 mmol/L (ref 3.5–5.1)
Sodium: 137 mmol/L (ref 135–145)

## 2021-11-21 LAB — CBC
HCT: 36.4 % — ABNORMAL LOW (ref 39.0–52.0)
Hemoglobin: 12.3 g/dL — ABNORMAL LOW (ref 13.0–17.0)
MCH: 30 pg (ref 26.0–34.0)
MCHC: 33.8 g/dL (ref 30.0–36.0)
MCV: 88.8 fL (ref 80.0–100.0)
Platelets: 164 10*3/uL (ref 150–400)
RBC: 4.1 MIL/uL — ABNORMAL LOW (ref 4.22–5.81)
RDW: 14.8 % (ref 11.5–15.5)
WBC: 17.2 10*3/uL — ABNORMAL HIGH (ref 4.0–10.5)
nRBC: 0 % (ref 0.0–0.2)

## 2021-11-21 LAB — CULTURE, BLOOD (ROUTINE X 2)

## 2021-11-21 LAB — HIV ANTIBODY (ROUTINE TESTING W REFLEX): HIV Screen 4th Generation wRfx: NONREACTIVE

## 2021-11-21 LAB — MAGNESIUM: Magnesium: 2.1 mg/dL (ref 1.7–2.4)

## 2021-11-21 LAB — ECHOCARDIOGRAM COMPLETE
AR max vel: 2.22 cm2
AV Area VTI: 2.37 cm2
AV Area mean vel: 2.15 cm2
AV Mean grad: 3 mmHg
AV Peak grad: 4.9 mmHg
Ao pk vel: 1.11 m/s
Area-P 1/2: 4.71 cm2
S' Lateral: 3.3 cm
Weight: 1936.52 oz

## 2021-11-21 MED ORDER — SODIUM CHLORIDE 0.9 % IV SOLN
1.0000 g | INTRAVENOUS | Status: DC
  Administered 2021-11-22 – 2021-11-23 (×2): 1000 mg via INTRAVENOUS
  Filled 2021-11-21 (×2): qty 1

## 2021-11-21 MED ORDER — INSULIN GLARGINE-YFGN 100 UNIT/ML ~~LOC~~ SOLN
5.0000 [IU] | Freq: Every day | SUBCUTANEOUS | Status: DC
  Administered 2021-11-21: 5 [IU] via SUBCUTANEOUS
  Filled 2021-11-21: qty 0.05

## 2021-11-21 MED ORDER — FUROSEMIDE 10 MG/ML IJ SOLN
20.0000 mg | Freq: Two times a day (BID) | INTRAMUSCULAR | Status: DC
  Administered 2021-11-21 – 2021-11-22 (×2): 20 mg via INTRAVENOUS
  Filled 2021-11-21 (×2): qty 4

## 2021-11-21 MED ORDER — ADULT MULTIVITAMIN W/MINERALS CH
1.0000 | ORAL_TABLET | Freq: Every day | ORAL | Status: DC
  Administered 2021-11-22 – 2021-11-23 (×2): 1 via ORAL
  Filled 2021-11-21 (×2): qty 1

## 2021-11-21 MED ORDER — NEPRO/CARBSTEADY PO LIQD
237.0000 mL | Freq: Three times a day (TID) | ORAL | Status: DC
  Administered 2021-11-21 – 2021-11-23 (×5): 237 mL via ORAL

## 2021-11-21 NOTE — Progress Notes (Signed)
Initial Nutrition Assessment  DOCUMENTATION CODES:   Severe malnutrition in context of chronic illness  INTERVENTION:   Nepro Shake po TID, each supplement provides 425 kcal and 19 grams protein  MVI po daily   Liberalize diet   Pt at high refeed risk; recommend monitor potassium, magnesium and phosphorus labs daily until stable  NUTRITION DIAGNOSIS:   Severe Malnutrition related to cancer and cancer related treatments as evidenced by severe fat depletion, severe muscle depletion.  GOAL:   Patient will meet greater than or equal to 90% of their needs  MONITOR:   PO intake, Supplement acceptance, Labs, Weight trends, Skin, I & O's  REASON FOR ASSESSMENT:   Consult Assessment of nutrition requirement/status  ASSESSMENT:   88 y/o male with h/o metastatic prostate cancer with invasion into the bladder s/p chemo/XRT, CHF, DM, CAD, HTN and GERD who is admitted with COVID 19 PNA, UTI and sepsis.  Met with pt and pt's daughter in room today. Pt does not speak English so daughter was able to translate. Daughter reports pt with good appetite and oral intake at baseline but reports pt's oral intake has been decreased since having COVID. Daughter reports that pt is a vegetarian and has been unable to order food that he likes off the menu. Pt has been eating mainly oatmeal in hospital. Pt is documented to be eating 50% of meals. RD discussed with pt and daughter the importance of adequate nutrition needed to preserve lean muscle. RD will add supplements to help pt meet his estimated needs (prefers berry flavors). RD will also liberalize pt's diet. Pt is at high refeed risk. Daughter reports pt with frequent coughing after drinking; this started before pt got COVID. SLP evaluation is pending.   Per chart, pt is down 3lbs(2%) over the past 2 weeks but appears fairly weight stable at baseline.   Medications reviewed and include: insulin, iron polysaccharides, solu-medrol, ertapenem,  meropenem  Labs reviewed: K 3.7 wnl, Mg 2.1 wnl Wbc- 17.2(H) Cbgs- 265, 275 x 24 hrs AIC 7.2(H)- 9/30  NUTRITION - FOCUSED PHYSICAL EXAM:  Flowsheet Row Most Recent Value  Orbital Region Moderate depletion  Upper Arm Region Severe depletion  Thoracic and Lumbar Region Severe depletion  Buccal Region Moderate depletion  Temple Region Moderate depletion  Clavicle Bone Region Severe depletion  Clavicle and Acromion Bone Region Severe depletion  Scapular Bone Region Moderate depletion  Dorsal Hand Severe depletion  Patellar Region Severe depletion  Anterior Thigh Region Severe depletion  Posterior Calf Region Severe depletion  Edema (RD Assessment) None  Hair Reviewed  Eyes Reviewed  Mouth Reviewed  Skin Reviewed  Nails Reviewed   Diet Order:   Diet Order             Diet heart healthy/carb modified Room service appropriate? Yes; Fluid consistency: Thin  Diet effective now                  EDUCATION NEEDS:   Education needs have been addressed  Skin:  Skin Assessment: Reviewed RN Assessment  Last BM:  9/29  Height:   Ht Readings from Last 1 Encounters:  11/03/21 $RemoveB'5\' 6"'vtwBriWl$  (1.676 m)    Weight:   Wt Readings from Last 1 Encounters:  11/19/21 54.9 kg    Ideal Body Weight:  64.5 kg  BMI:  Body mass index is 19.54 kg/m.  Estimated Nutritional Needs:   Kcal:  1700-1900kcal/day  Protein:  85-95g/day  Fluid:  1.4-1.6L/day  Koleen Distance MS, RD, LDN Please  refer to [facility] for RD and/or RD on-call/weekend/after hours pager

## 2021-11-21 NOTE — Consult Note (Signed)
NAME: Michael [name]  DOB: [birth year]  MRN: [id number]  Date/Time: 11/21/2021 12:13 PM  REQUESTING PROVIDER: Dr.krishnan Subjective:  REASON FOR CONSULT: ESBl e.coli bacteremia ? Michael [name] is a 88 y.o. with a history of metastatic prostate cancer on Xtandi , extensive skeletal mets, s/p radiation  pelvic LN mets anterior bladder wall mass, coronary artery disease, diabetes mellitus, essential hypertension ,presents with syncope, chills, shakes and hematuria of 1 day duration  On 9/29/ saw  urology OP for LUTS and self cath attempted and finally foley was passed , it was traumatic and he was sent home without cath- as he continued to have hematuria and then developed shakes, chills and syncope . 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  Resp 17  SpO2 100 %    Latest Reference Range & Units 11/18/21 19:37  WBC 4.0 - 10.5 K/uL 3.9 (L)  Hemoglobin 13.0 - 17.0 g/dL 10.1 (L)  HCT 39.0 - 52.0 % 31.4 (L)  Platelets 150 - 400 K/uL 217  Cr 0.95 CT abdomen [name] /pelvis showed  Diffuse bladder wall thickening and possible urothelial hyperenhancement of the ureters. Recommend correlation with urinalysis for cystitis. Increased size of the focal anterior midline bladder wall mass suspicious for metastasis versus primary neoplasm. 4. Nonspecific patchy ground-glass and centrilobular micro nodularity with upper lobe predominance is likely infectious/inflammatory not significantly changed from prior.5. Similar to slight increase in indeterminate mediastinal and left hilar adenopathy. 6. Widespread osseous metastases  Seen by urologist and foley was placed on 9/30  I am seeing the patient as blood culture positive for ESBL e.coli   Patient has complicated  urological history.  In April 11, 2017 he was in the hospital with acute kidney injury due to urinary retention, bilateral hydronephrosis, extensive bulky retroperitoneal adenopathy invasion into the bladder and rectum with metastasis.  He underwent cystoscopy and had left ureteral stent placement as well as bladder neck contracture dilatation.  He had a right nephrostomy placed.  He was diagnosed with advanced prostate cancer.  He had a Foley placed.  Since then he has been under the care of oncology and urology.  PET scan had revealed extensive bone mets as well.  On 05/21/2017 the right nephrostomy was changed into her right ureteral stent by IR.  On 09/12/2017 he had bilateral ureteral stents exchanged by Dr. Erlene Quan..and eventually it was removed Pt is being followed by oncologist Dr.Brahmandy, urologist Past Medical History:  Diagnosis Date   Anemia    iron deficiency   Aortic atherosclerosis ([place])    Bilateral carotid artery disease (HCC)    Mild plaque formation without obstructive disease noted on carotid Doppler.   Diabetes mellitus without complication ([place])    Essential hypertension    GERD (gastroesophageal reflux disease)    Hyperlipidemia    Hypertension    Left carotid artery stenosis    Nonobstructive Coronary artery disease    a. Previous cardiac catheterization at [facility] in 2010. The patient was told about 2 blockages which did not require revascularization; b. 01/2015 Cath: LM nl, LAD 10p/m, 72md, D2 30ost, LCX nl, RCA min irregs, EF 55-65%; b. 12/2018 MV: No ischemia/infarct. CT images w/ mod 3 vessel Cor Ca2+  and mild-mod Ao atherosclerosis.   Prostate cancer ([place]) 03/2017   Raynaud disease    a. Managed w/ nifedipine.    Past Surgical History:  Procedure Laterality Date   CARDIAC CATHETERIZATION  2010   CARDIAC CATHETERIZATION N/A 02/03/2015   Procedure: Left Heart Cath and Coronary Angiography;  Surgeon: Wellington [name], MD;  Location: [facility];  Service:  Cardiovascular;  Laterality: N/A;   CATARACT EXTRACTION Bilateral    CYSTOSCOPY W/ RETROGRADES Bilateral 04/11/2017   Procedure: CYSTOSCOPY WITH RETROGRADE PYELOGRAM;  Surgeon: [name], MD;  Location: [facility];  Service: Urology;  Laterality: Bilateral;   CYSTOSCOPY W/ RETROGRADES Bilateral 09/12/2017   Procedure: CYSTOSCOPY WITH RETROGRADE PYELOGRAM;  Surgeon: [name], MD;  Location: [facility];  Service: Urology;  Laterality: Bilateral;   CYSTOSCOPY W/ RETROGRADES Bilateral 02/25/2018   Procedure: CYSTOSCOPY WITH RETROGRADE PYELOGRAM;  Surgeon: [name], MD;  Location: [facility];  Service: Urology;  Laterality: Bilateral;   CYSTOSCOPY W/ URETERAL STENT PLACEMENT Bilateral 09/12/2017   Procedure: CYSTOSCOPY WITH STENT REPLACEMENT;  Surgeon: [name], MD;  Location: [facility];  Service: Urology;  Laterality: Bilateral;   CYSTOSCOPY W/ URETERAL STENT REMOVAL Bilateral 02/25/2018   Procedure: CYSTOSCOPY WITH STENT REMOVAL;  Surgeon: [name], MD;  Location: [facility];  Service: Urology;  Laterality: Bilateral;   CYSTOSCOPY WITH STENT PLACEMENT Left 04/11/2017   Procedure: CYSTOSCOPY WITH STENT PLACEMENT and fulgeration;  Surgeon: [name], MD;  Location: [facility];  Service: Urology;  Laterality: Left;   CYSTOSCOPY WITH URETHRAL DILATATION Bilateral 04/11/2017   Procedure: CYSTOSCOPY WITH URETHRAL DILATATION;  Surgeon: [name], MD;  Location: [facility];  Service: Urology;  Laterality: Bilateral;   IR CONVERT RIGHT NEPHROSTOMY TO NEPHROURETERAL CATH  05/21/2017   IR NEPHROSTOMY PLACEMENT RIGHT  04/13/2017    Social History   Socioeconomic History   Marital status: Widowed    Spouse name: Not on file   Number of children: Not on file   Years of education: Not on file   Highest education level: Not on file  Occupational History    Comment: retired  Tobacco Use   Smoking status: Never   Smokeless tobacco: Never  Vaping Use   Vaping Use: Never used   Substance and Sexual Activity   Alcohol use: No   Drug use: No   Sexual activity: Not Currently  Other Topics Concern   Not on file  Social History Narrative   Not on file   Social Determinants of Health   Financial Resource Strain: Low Risk  (02/26/2018)   Overall Financial Resource Strain (CARDIA)    Difficulty of Paying Living Expenses: Not hard at all  Food Insecurity: Unknown (11/19/2021)   Hunger Vital Sign    Worried About Running Out of Food in the Last Year: Not on file    Ran Out of Food in the Last Year: Never true  Transportation Needs: No Transportation Needs (11/19/2021)   PRAPARE - Hydrologist (Medical): No    Lack of Transportation (Non-Medical): No  Physical Activity: Inactive (02/26/2018)   Exercise Vital Sign    Days of Exercise per Week: 0 days    Minutes of Exercise per Session: 0 min  Stress: No Stress Concern Present (02/26/2018)   [place]    Feeling of Stress : Only a little  Social Connections: Moderately Isolated (02/26/2018)   Social Connection and Isolation Panel [NHANES]    Frequency  of Communication with Friends and Family: Twice a week    Frequency of Social Gatherings with Friends and Family: Twice a week    Attends Religious Services: Never    Marine scientist or Organizations: No    Attends Archivist Meetings: Never    Marital Status: Widowed  Intimate Partner Violence: Not At Risk (11/19/2021)   Humiliation, Afraid, Rape, and Kick questionnaire    Fear of Current or Ex-Partner: No    Emotionally Abused: No    Physically Abused: No    Sexually Abused: No    Family History  Problem Relation Age of Onset   Hypertension Father    No Known Allergies I? Current Facility-Administered Medications  Medication Dose Route Frequency Provider Last Rate Last Admin   acetaminophen (TYLENOL) tablet 650 mg  650 mg Oral Q6H PRN Athena Masse,  MD       Or   acetaminophen (TYLENOL) suppository 650 mg  650 mg Rectal Q6H PRN Athena Masse, MD       albuterol (VENTOLIN HFA) 108 (90 Base) MCG/ACT inhaler 1-2 puff  1-2 puff Inhalation Q4H PRN Sharen Hones, MD       [START ON 11/25/2021] atorvastatin (LIPITOR) tablet 10 mg  10 mg Oral q1800 Dorothe Pea, RPH       Chlorhexidine Gluconate Cloth 2 % PADS 6 each  6 each Topical Daily Sharen Hones, MD   6 each at 11/21/21 1113   chlorpheniramine-HYDROcodone (TUSSIONEX) 10-8 MG/5ML suspension 5 mL  5 mL Oral Q12H PRN Athena Masse, MD       guaiFENesin-dextromethorphan (ROBITUSSIN DM) 100-10 MG/5ML syrup 10 mL  10 mL Oral Q4H PRN Athena Masse, MD       HYDROcodone-acetaminophen (NORCO/VICODIN) 5-325 MG per tablet 1-2 tablet  1-2 tablet Oral Q4H PRN Athena Masse, MD       insulin aspart (novoLOG) injection 0-15 Units  0-15 Units Subcutaneous TID WC Athena Masse, MD   8 Units at 11/21/21 0843   insulin aspart (novoLOG) injection 0-5 Units  0-5 Units Subcutaneous QHS Athena Masse, MD   3 Units at 11/20/21 2138   Ipratropium-Albuterol (COMBIVENT) respimat 1 puff  1 puff Inhalation Q6H Sharen Hones, MD   1 puff at 11/21/21 0843   iron polysaccharides (NIFEREX) capsule 150 mg  150 mg Oral Daily Sharen Hones, MD   150 mg at 11/21/21 1059   meropenem (MERREM) 1 g in sodium chloride 0.9 % 100 mL IVPB  1 g Intravenous Q8H Virl Cagey E, RPH 200 mL/hr at 11/21/21 0513 1 g at 11/21/21 0513   methylPREDNISolone sodium succinate (SOLU-MEDROL) 40 mg/mL injection 40 mg  40 mg Intravenous Daily Sharen Hones, MD   40 mg at 11/21/21 1059   metoprolol succinate (TOPROL-XL) 24 hr tablet 25 mg  25 mg Oral Daily Judd Gaudier V, MD   25 mg at 11/21/21 1059   nirmatrelvir/ritonavir EUA (PAXLOVID) 3 tablet  3 tablet Oral BID Sharen Hones, MD   3 tablet at 11/21/21 1113   ondansetron (ZOFRAN) tablet 4 mg  4 mg Oral Q6H PRN Athena Masse, MD       Or   ondansetron [facility]) injection 4 mg  4 mg  Intravenous Q6H PRN Athena Masse, MD       oxyCODONE (Oxy IR/ROXICODONE) immediate release tablet 5 mg  5 mg Oral Q4H PRN Athena Masse, MD         Abtx:  Anti-infectives (From admission, onward)    Start     Dose/Rate Route Frequency Ordered Stop   11/20/21 1400  meropenem (MERREM) 1 g in sodium chloride 0.9 % 100 mL IVPB        1 g 200 mL/hr over 30 Minutes Intravenous Every 8 hours 11/20/21 1003     11/19/21 2200  ceFEPIme (MAXIPIME) 2 g in sodium chloride 0.9 % 100 mL IVPB  Status:  Discontinued        2 g 200 mL/hr over 30 Minutes Intravenous Every 12 hours 11/19/21 0820 11/19/21 1339   11/19/21 2200  nirmatrelvir/ritonavir EUA (PAXLOVID) 3 tablet        3 tablet Oral 2 times daily 11/19/21 1302 11/24/21 0959   11/19/21 1345  meropenem (MERREM) 1 g in sodium chloride 0.9 % 100 mL IVPB  Status:  Discontinued        1 g 200 mL/hr over 30 Minutes Intravenous Every 12 hours 11/19/21 1339 11/20/21 1003   11/19/21 1200  nirmatrelvir/ritonavir EUA (PAXLOVID) 3 tablet  Status:  Discontinued        3 tablet Oral 2 times daily 11/19/21 0424 11/19/21 1302   11/19/21 0800  ceFEPIme (MAXIPIME) 2 g in sodium chloride 0.9 % 100 mL IVPB  Status:  Discontinued        2 g 200 mL/hr over 30 Minutes Intravenous Every 8 hours 11/19/21 0300 11/19/21 0301   11/19/21 0800  ceFEPIme (MAXIPIME) 2 g in sodium chloride 0.9 % 100 mL IVPB  Status:  Discontinued        2 g 200 mL/hr over 30 Minutes Intravenous Every 8 hours 11/19/21 0301 11/19/21 0820   11/19/21 0700  azithromycin (ZITHROMAX) 500 mg in sodium chloride 0.9 % 250 mL IVPB  Status:  Discontinued        500 mg 250 mL/hr over 60 Minutes Intravenous Every 24 hours 11/19/21 0422 11/19/21 1545   11/18/21 2315  ceFEPIme (MAXIPIME) 2 g in sodium chloride 0.9 % 100 mL IVPB        2 g 200 mL/hr over 30 Minutes Intravenous  Once 11/18/21 2310 11/19/21 0032       REVIEW OF SYSTEMS:  Const: negative fever, + chills,  weight loss Eyes: negative  diplopia or visual changes, negative eye pain ENT: negative coryza, negative sore throat Resp: cough since he had covid in Aug 2023, +, dyspnea Cards: negative for chest pain, palpitations, lower extremity edema GU:  frequency, dysuria and hematuria GI: + abdominal pain, no diarrhea, bleeding, constipation Skin: negative for rash and pruritus Heme: negative for easy bruising and gum/nose bleeding MS: some weakness Neurolo:syncope Psych: negative for feelings of anxiety, depression  Endocrine: negative for thyroid, diabetes Allergy/Immunology- negative for any medication or food allergies ?  Objective:  VITALS:  BP (!) 154/96 (BP Location: Left Arm)   Pulse 88   Temp (!) 97.5 F (36.4 C)   Resp (!) 25   Wt 54.9 kg   SpO2 98%   BMI 19.54 kg/m   PHYSICAL EXAM:  General: Alert, cooperative, no distress, appears stated age.  Head: Normocephalic, without obvious abnormality, atraumatic. Eyes: Conjunctivae clear, anicteric sclerae. Pupils are equal ENT Nares normal. No drainage or sinus tenderness. Lips, mucosa, and tongue normal. No Thrush Neck: Supple, symmetrical, no adenopathy, thyroid: non tender no carotid bruit and no JVD. Back: No CVA tenderness. Lungs: Clear to auscultation bilaterally. No Wheezing or Rhonchi. No rales. Heart: Regular rate and rhythm, no murmur, rub or gallop.  Abdomen: Soft, non-tender,not distended. Bowel sounds normal. No masses Extremities: atraumatic, no cyanosis. No edema. No clubbing Skin: No rashes or lesions. Or bruising Lymph: Cervical, supraclavicular normal. Neurologic: Grossly non-focal Pertinent Labs Lab Results CBC    Component Value Date/Time   WBC 17.2 (H) 11/21/2021 0628   RBC 4.10 (L) 11/21/2021 0628   HGB 12.3 (L) 11/21/2021 0628   HGB 13.7 05/07/2012 1328   HCT 36.4 (L) 11/21/2021 0628   PLT 164 11/21/2021 0628   MCV 88.8 11/21/2021 0628   MCH 30.0 11/21/2021 0628   MCHC 33.8 11/21/2021 0628   RDW 14.8 11/21/2021 0628    LYMPHSABS 0.1 (L) 11/18/2021 1937   MONOABS 0.0 (L) 11/18/2021 1937   EOSABS 0.0 11/18/2021 1937   BASOSABS 0.0 11/18/2021 1937       Latest Ref Rng & Units 11/21/2021    6:28 AM 11/20/2021    6:15 AM 11/18/2021    8:24 PM  CMP  Glucose 70 - 99 mg/dL 249  177  184   BUN 8 - 23 mg/dL '22  19  19   '$ Creatinine 0.61 - 1.24 mg/dL 1.05  1.02  0.95   Sodium 135 - 145 mmol/L 137  139  134   Potassium 3.5 - 5.1 mmol/L 3.7  3.8  3.9   Chloride 98 - 111 mmol/L 107  114  103   CO2 22 - 32 mmol/L '22  22  20   '$ Calcium 8.9 - 10.3 mg/dL 7.9  8.0  8.8   Total Protein 6.5 - 8.1 g/dL   7.2   Total Bilirubin 0.3 - 1.2 mg/dL   0.7   Alkaline Phos 38 - 126 U/L   72   AST 15 - 41 U/L   65   ALT 0 - 44 U/L   27       Microbiology: Recent Results (from the past 240 hour(s))  Blood Culture (routine x 2)     Status: None (Preliminary result)   Collection Time: 11/18/21 11:39 PM   Specimen: BLOOD  Result Value Ref Range Status   Specimen Description BLOOD RIGHT ANTECUBITAL  Final   Special Requests   Final    BOTTLES DRAWN AEROBIC AND ANAEROBIC Blood Culture results may not be optimal due to an inadequate volume of blood received in culture bottles   Culture   Final    NO GROWTH 2 DAYS Performed at [facility], [address] [place] [address]    Report Status PENDING  Incomplete  Resp Panel by RT-PCR (Flu A&B, Covid) Anterior Nasal Swab     Status: Abnormal   Collection Time: 11/18/21 11:40 PM   Specimen: Anterior Nasal Swab  Result Value Ref Range Status   SARS Coronavirus 2 by RT PCR POSITIVE (A) NEGATIVE Final    Comment: (NOTE) SARS-CoV-2 target nucleic acids are DETECTED.  The SARS-CoV-2 RNA is generally detectable in upper respiratory specimens during the acute phase of infection. Positive results are indicative of the presence of the identified virus, but do not rule out bacterial infection or co-infection with other pathogens not detected by the test. Clinical  correlation with patient history and other diagnostic information is necessary to determine patient infection status. The expected result is Negative.  Fact Sheet for Patients: EntrepreneurPulse.com.au  Fact Sheet for Healthcare Providers: IncredibleEmployment.be  This test is not yet approved or cleared by the [place] FDA and  has been authorized for detection and/or diagnosis of SARS-CoV-2 by FDA under an  Emergency Use Authorization (EUA).  This EUA will remain in effect (meaning this test can be used) for the duration of  the COVID-19 declaration under Section 564(b)(1) of the A ct, 21 U.S.C. section 360bbb-3(b)(1), unless the authorization is terminated or revoked sooner.     Influenza A by PCR NEGATIVE NEGATIVE Final   Influenza B by PCR NEGATIVE NEGATIVE Final    Comment: (NOTE) The Xpert Xpress SARS-CoV-2/FLU/RSV plus assay is intended as an aid in the diagnosis of influenza from Nasopharyngeal swab specimens and should not be used as a sole basis for treatment. Nasal washings and aspirates are unacceptable for Xpert Xpress SARS-CoV-2/FLU/RSV testing.  Fact Sheet for Patients: EntrepreneurPulse.com.au  Fact Sheet for Healthcare Providers: IncredibleEmployment.be  This test is not yet approved or cleared by the [place] FDA and has been authorized for detection and/or diagnosis of SARS-CoV-2 by FDA under an Emergency Use Authorization (EUA). This EUA will remain in effect (meaning this test can be used) for the duration of the COVID-19 declaration under Section 564(b)(1) of the Act, 21 U.S.C. section 360bbb-3(b)(1), unless the authorization is terminated or revoked.  Performed at [facility], [address], [place], [place] [address]   Blood Culture (routine x 2)     Status: Abnormal   Collection Time: 11/18/21 11:40 PM   Specimen: BLOOD  Result Value Ref Range Status    Specimen Description   Final    BLOOD LEFT ANTECUBITAL Performed at [facility], [address] [place], [place] [address]    Special Requests   Final    BOTTLES DRAWN AEROBIC AND ANAEROBIC Blood Culture results may not be optimal due to an inadequate volume of blood received in culture bottles Performed at [facility], [address] [place], [place] [address]    Culture  Setup Time   Final    Organism ID to follow [place] CALLED TO, READ BACK BY AND VERIFIED WITH: CARISSA [facility] 11/19/21 1336 SLM Performed at [facility], [address] [place], [place] [address]    Culture (A)  Final    ESCHERICHIA COLI Confirmed Extended Spectrum Beta-Lactamase Producer (ESBL).  In bloodstream infections from ESBL organisms, carbapenems are preferred over piperacillin/tazobactam. They are shown to have a lower risk of mortality.    Report Status 11/21/2021 FINAL  Final   Organism ID, Bacteria ESCHERICHIA COLI  Final      Susceptibility   Escherichia coli - MIC*    AMPICILLIN >=32 RESISTANT Resistant     CEFAZOLIN >=64 RESISTANT Resistant     CEFEPIME 0.5 SENSITIVE Sensitive     CEFTAZIDIME RESISTANT Resistant     CEFTRIAXONE >=64 RESISTANT Resistant     CIPROFLOXACIN >=4 RESISTANT Resistant     GENTAMICIN >=16 RESISTANT Resistant     IMIPENEM <=0.25 SENSITIVE Sensitive     TRIMETH/SULFA >=320 RESISTANT Resistant     AMPICILLIN/SULBACTAM <=2 SENSITIVE Sensitive     PIP/TAZO <=4 SENSITIVE Sensitive     * ESCHERICHIA COLI  Urine Culture     Status: Abnormal   Collection Time: 11/18/21 11:40 PM   Specimen: In/Out Cath Urine  Result Value Ref Range Status   Specimen Description   Final    IN/OUT CATH URINE Performed at [facility], [address], [place], [place] [address]    Special Requests   Final    NONE Performed at [facility], [address], [place],  [address]  Culture >=100,000 COLONIES/mL ESCHERICHIA COLI (A)  Final   Report Status 11/21/2021 FINAL  Final   Organism ID, Bacteria ESCHERICHIA COLI (A)  Final      Susceptibility   Escherichia coli - MIC*    AMPICILLIN <=2 SENSITIVE Sensitive     CEFAZOLIN <=4 SENSITIVE Sensitive     CEFEPIME <=0.12 SENSITIVE Sensitive     CEFTRIAXONE <=0.25 SENSITIVE Sensitive     CIPROFLOXACIN >=4 RESISTANT Resistant     GENTAMICIN 4 SENSITIVE Sensitive     IMIPENEM <=0.25 SENSITIVE Sensitive     NITROFURANTOIN <=16 SENSITIVE Sensitive     TRIMETH/SULFA >=320 RESISTANT Resistant     AMPICILLIN/SULBACTAM <=2 SENSITIVE Sensitive     PIP/TAZO <=4 SENSITIVE Sensitive     * >=100,000 COLONIES/mL ESCHERICHIA COLI  Blood Culture ID Panel (Reflexed)     Status: Abnormal   Collection Time: 11/18/21 11:40 PM  Result Value Ref Range Status   Enterococcus faecalis NOT DETECTED NOT DETECTED Final   Enterococcus Faecium NOT DETECTED NOT DETECTED Final   Listeria monocytogenes NOT DETECTED NOT DETECTED Final   Staphylococcus species NOT DETECTED NOT DETECTED Final   Staphylococcus aureus (BCID) NOT DETECTED NOT DETECTED Final   Staphylococcus epidermidis NOT DETECTED NOT DETECTED Final   Staphylococcus lugdunensis NOT DETECTED NOT DETECTED Final   Streptococcus species NOT DETECTED NOT DETECTED Final   Streptococcus agalactiae NOT DETECTED NOT DETECTED Final   Streptococcus pneumoniae NOT DETECTED NOT DETECTED Final   Streptococcus pyogenes NOT DETECTED NOT DETECTED Final   A.calcoaceticus-baumannii NOT DETECTED NOT DETECTED Final   Bacteroides fragilis NOT DETECTED NOT DETECTED Final   Enterobacterales DETECTED (A) NOT DETECTED Final    Comment: Enterobacterales represent a large order of gram negative bacteria, not a single organism. CRITICAL RESULT CALLED TO, READ BACK BY AND VERIFIED WITH: CARISSA [facility] 11/19/21 1336 SLM    Enterobacter cloacae complex NOT DETECTED NOT DETECTED Final   Escherichia coli DETECTED  (A) NOT DETECTED Final    Comment: CRITICAL RESULT CALLED TO, READ BACK BY AND VERIFIED WITH: CARISSA DOLAN 11/19/21 1336 SLM    Klebsiella aerogenes NOT DETECTED NOT DETECTED Final   Klebsiella oxytoca NOT DETECTED NOT DETECTED Final   Klebsiella pneumoniae NOT DETECTED NOT DETECTED Final   Proteus species NOT DETECTED NOT DETECTED Final   Salmonella species NOT DETECTED NOT DETECTED Final   Serratia marcescens NOT DETECTED NOT DETECTED Final   Haemophilus influenzae NOT DETECTED NOT DETECTED Final   Neisseria meningitidis NOT DETECTED NOT DETECTED Final   Pseudomonas aeruginosa NOT DETECTED NOT DETECTED Final   Stenotrophomonas maltophilia NOT DETECTED NOT DETECTED Final   Candida albicans NOT DETECTED NOT DETECTED Final   Candida auris NOT DETECTED NOT DETECTED Final   Candida glabrata NOT DETECTED NOT DETECTED Final   Candida krusei NOT DETECTED NOT DETECTED Final   Candida parapsilosis NOT DETECTED NOT DETECTED Final   Candida tropicalis NOT DETECTED NOT DETECTED Final   Cryptococcus neoformans/gattii NOT DETECTED NOT DETECTED Final   CTX-M ESBL DETECTED (A) NOT DETECTED Final    Comment: CRITICAL RESULT CALLED TO, READ BACK BY AND VERIFIED WITH: CARISSA DOLAN 11/19/21 1336 SLM (NOTE) Extended spectrum beta-lactamase detected. Recommend a carbapenem as initial therapy.      Carbapenem resistance IMP NOT DETECTED NOT DETECTED Final   Carbapenem resistance KPC NOT DETECTED NOT DETECTED Final   Carbapenem resistance NDM NOT DETECTED NOT DETECTED Final   Carbapenem resist OXA 48 LIKE NOT DETECTED NOT DETECTED Final   Carbapenem resistance VIM NOT  DETECTED NOT DETECTED Final    Comment: Performed at [facility], [address] [place], [place] [address]    IMAGING RESULTS:  I have personally reviewed the films ? Impression/Recommendation ? ?Sepsis secondary to ESBL e.coli bacteremia Due to complicated UTI Same organism in the urine Prostate Ca, incomplete  urinary emptying, traumatic foley, bladder mass all contributing to the above Hematuria due to traumatic foley On Iv meropenem Will need 10-14 days of IV antibiotic- will change meropenem to ertapenem  Metastatic ca prostate with extensive vertebral mets and pelvis Mass anterior to the bladder  Anemia - acute- due to hematuria- got PRBC ?  SOB- could be a combination of sepsis- ARDS   COVID diagnosed aug- so this is unlikely to be a new infection Called lab and CT value is 44 so this is an old infection. Even if he has  cough /SOB and CT shows nodular infiltrate and GGO ( which was seen in CT even before COVID)  paxlovid may not help   DM on sliding scale  CAD  HTN ___________________________________________________ Discussed with patient, and his daughter Note:  This document was prepared using Dragon voice recognition software and may include unintentional dictation errors.

## 2021-11-21 NOTE — Assessment & Plan Note (Signed)
Secondary to Foley catheter placement and likely from metastatic prostate cancer to bladder.

## 2021-11-21 NOTE — Progress Notes (Signed)
[facility] Progress Note  Patient: Michael [name]    WVP:[id number]  DOA: 11/18/2021    Date of Service: the patient was seen and examined on 11/21/2021  Brief hospital course: 88 year old male with past medical history of advanced metastatic prostate cancer status post radiation for spinal cord compression as well as diabetes, hypertension and ESBL UTI who presented to the emergency department after an episode of syncope.  Patient noted to have 3 days of gross hematuria after Foley cath anchored urology office.  Patient found to have sepsis secondary to ESBL E. coli UTI as well as positive for COVID infection.  Started on IV antibiotics and fluids.  Assessment and Plan: Assessment and Plan: * Severe sepsis ([place]) secondary to ESBL E. coli UTI Patient met criteria for severe sepsis on admission secondary to lactic acidosis, tachycardia, tachypnea and urinary source with positive blood cultures for ESBL E. coli.  On IV meropenem.  Sepsis since stabilized.  ABLA (acute blood loss anemia) Hematuria secondary to traumatic catheterization and bladder mass Secondary to hematuria and in part delusional Will type cross and transfuse 1 unit PRBC  Pneumonia due to COVID-19 virus Continue Paxlovid.  Checking swallow eval to rule out aspiration.  Patient not hypoxic.  Some upper respiratory wheezing.   Primary prostate cancer with metastasis from prostate to other site [facility]) Status post radiation to spine.  Urology following.   Syncope and collapse Likely vasovagal syncope secondary to acute infection with sepsis Continue management of sepsis above Neurologic checks, continuous cardiac monitoring  Acute urinary retention Status post Foley catheter placement  Protein-calorie malnutrition, severe Nutrition Status: Nutrition Problem: Severe Malnutrition Etiology: cancer and cancer related treatments Signs/Symptoms: severe fat depletion, severe muscle depletion     Seen by  nutrition.  Diet liberalized.  Essential hypertension Initially held antihypertensives due to soft blood pressures on arrival.  Now restarted.  We will try some diuresis, likely elevated due to fluid resuscitation.    Coronary artery disease involving native coronary artery of native heart without angina pectoris Continue metoprolol.  Continue atorvastatin.  Not currently on aspirin  Type 2 diabetes mellitus without complication, with long-term current use of insulin (HCC) CBGs elevated in part due to steroids.  On sliding scale plus normally on oral Actos, metformin and glipizide.  A1c notes good control at 7.2.  We will start some low-dose Lantus while he is still on steroids  Iron deficiency anemia Combination of acute blood loss anemia plus anemia of chronic disease from prostate cancer.  Getting IV iron.  Hypomagnesemia Replacing as needed.  Hematuria Secondary to Foley catheter placement and likely from metastatic prostate cancer to bladder.       Body mass index is 19.54 kg/m.  Nutrition Problem: Severe Malnutrition Etiology: cancer and cancer related treatments     Consultants: Urology  Procedures: Status post 1 unit packed red blood cell transfusion Echocardiogram  Antimicrobials: IV meropenem 9/29-present  Code Status: Full code   Subjective: Spoke to patient through interpreter.  Complains of cough, but denies any shortness of breath or pain  Objective: Vital signs were reviewed and unremarkable. Vitals:   11/21/21 0900 11/21/21 1459  BP:  (!) 150/88  Pulse:  87  Resp: (!) 25 14  Temp:  97.7 F (36.5 C)  SpO2:  100%    Intake/Output Summary (Last 24 hours) at 11/21/2021 1737 Last data filed at 11/21/2021 0304 Gross per 24 hour  Intake 200 ml  Output --  Net 200 ml   Danley Danker  Weights   11/19/21 1300  Weight: 54.9 kg   Body mass index is 19.54 kg/m.  Exam:  General: Alert and oriented x2, no acute distress HEENT: Normocephalic,  atraumatic, mucous membranes are moist Cardiovascular: Regular rate and rhythm, S1-S2 Respiratory: Clear to auscultation bilaterally, wheezing is from upper airway Abdomen: Soft, nontender, nondistended, positive bowel sounds Musculoskeletal: No clubbing or cyanosis or edema Skin: No skin breaks, tears or lesions Psychiatry: Appropriate, no evidence of psychoses Neurology: No focal deficits  Data Reviewed: White blood cell count of 17.2.  Glucose of 249.  Disposition:  Status is: Inpatient Remains inpatient appropriate because:  Continued Paxlovid -Following up on procalcitonin -Diuresing    Anticipated discharge date: 10/3 or 10/4  Family Communication: We will call daughter DVT Prophylaxis: SCDs Start: 11/19/21 0244    Author: Annita Brod ,MD 11/21/2021 5:37 PM  To reach On-call, see care teams to locate the attending and reach out via [email or web address]. Between 7PM-7AM, please contact night-coverage If you still have difficulty reaching the attending provider, please page the [facility] (Director on Call) for [facility] on amion for assistance.

## 2021-11-21 NOTE — Telephone Encounter (Signed)
Pt was admitted, Urology was consulted.

## 2021-11-21 NOTE — Assessment & Plan Note (Signed)
Status post radiation to spine.  Urology following.

## 2021-11-21 NOTE — TOC Initial Note (Signed)
Transition of Care [facility]) - Initial/Assessment Note    Patient Details  Name: Michael [name] MRN: [id number] Date of Birth: 1933/05/03  Transition of Care [facility]) CM/SW Contact:    Beverly Sessions, RN Phone Number: 11/21/2021, 10:24 AM  Clinical Narrative:                  Due to covid isolation assessment completed via phone with daughter  Admitted for: Sepsis Admitted from:home with daughter PCP: Hande - family transports to appointments  Current home health/prior home health/DME: [facility] and shower seat  Patient self caths at home Therapy recommending home health. Daughter agreeable and states she does not have a preference of agency.  Referral made and accepted by Tommi Rumps with [facility] for RN PT and OT  Daughter to transport at discharge       Patient Goals and CMS Choice        Expected Discharge Plan and Services                                                Prior Living Arrangements/Services                       Activities of Daily Living Home Assistive Devices/Equipment: Cane (specify quad or straight) ADL Screening (condition at time of admission) Patient's cognitive ability adequate to safely complete daily activities?: Yes Is the patient deaf or have difficulty hearing?: Yes Does the patient have difficulty seeing, even when wearing glasses/contacts?: No Does the patient have difficulty concentrating, remembering, or making decisions?: No Patient able to express need for assistance with ADLs?: Yes Does the patient have difficulty dressing or bathing?: No Independently performs ADLs?: Yes (appropriate for developmental age) Does the patient have difficulty walking or climbing stairs?: No Weakness of Legs: Right Weakness of Arms/Hands: Left  Permission Sought/Granted                  Emotional Assessment              Admission diagnosis:  Syncope and collapse [R55] Sepsis ([place]) [A41.9] Urinary tract infection without  hematuria, site unspecified [N39.0] Community acquired pneumonia, unspecified laterality [J18.9] Sepsis, due to unspecified organism, unspecified whether acute organ dysfunction present ([place]) [A41.9] COVID-19 [U07.1] Patient Active Problem List   Diagnosis Date Noted   Hypomagnesemia 11/20/2021   Acute on chronic diastolic CHF (congestive heart failure) ([place]) 11/20/2021   Syncope and collapse 11/19/2021   Pneumonia due to COVID-19 virus 11/19/2021   Hematuria 11/19/2021   ABLA (acute blood loss anemia) 11/19/2021   Septicemia due to Escherichia coli (E. 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   Endophthalmitis, right eye 10/29/2017   Type 2 diabetes mellitus without complication, with long-term current use of insulin ([place]) 06/15/2017   Pure hypercholesterolemia 06/15/2017   Anemia, unspecified 06/15/2017   UTI due to extended-spectrum beta lactamase (ESBL) producing Escherichia coli 05/17/2017   Protein-calorie malnutrition, severe 05/09/2017   Acute febrile illness 05/07/2017   Goals of care, counseling/discussion 04/21/2017   Primary prostate cancer with metastasis from prostate to other site (  [place])    Retroperitoneal lymphadenopathy    Hydronephrosis of right kidney    Abdominal pain 04/11/2017   Type II diabetes mellitus, uncontrolled 04/05/2017   Coronary artery disease involving native coronary artery of native heart without angina pectoris 03/15/2015   Coronary artery disease involving native coronary artery with angina pectoris ([place]) 01/22/2015   Essential hypertension    Hyperlipidemia    Spermatocele 03/26/2012   Benign localized prostatic hyperplasia with lower urinary tract symptoms  (LUTS) 03/22/2012   Candidiasis of urogenital sites 03/22/2012   ED (erectile dysfunction) of organic origin 03/22/2012   Elevated prostate specific antigen (PSA) 03/22/2012   Incomplete emptying of bladder 03/22/2012   Redundant prepuce and phimosis 03/22/2012   Urge incontinence 03/22/2012   PCP:  [name], MD Pharmacy:   [id number] [id number]: [phone]: [address]Phone: [phone]: [phone] [id number] [place] [address]hone: [phone]: [phone]   Social Determinants of Health (SDOH) Interventions    Readmission Risk Interventions     No data to display

## 2021-11-21 NOTE — Evaluation (Signed)
Occupational Therapy Evaluation Patient Details Name: Michael [name] MRN: [id number] DOB: 08/14/1933 Today's Date: 11/21/2021   History of Present Illness Michael [name] is an 4yoM who [facility] after syncope and collapse x1 minute while being assisted to BR. Workup shows hypotension, tachycardia, tachypnea, (+) UA c E. coli ESBL, PNA 2/2 COVID. 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  Imaging studies in May this year show metastatic changes to cervical and thoracic spine with areas of cord compression C7-T4, however DTR denies knowledge of any specific impairment pertaining to these findings. Pt has ABLA from attempts to place foley after [place] at urology office showed >400cc post-void retention. Pt is now s/p PRBC infusion. No prior episodes of syncope.   Clinical Impression   Patient presenting with decreased independence in self-care, functional mobility, safety, cognition, and endurance. Patient daughter in room, assisting in providing PLOF, home setup, and interpreting. She reports patient lives with her has a tub shower, shower chair, and RW. Patient currently functioning independent with bed mobility and transfers from supine <> sit. Patient was able to ambulate to bathroom to perform toilet transfer with supervision. Patient was also able to ambulate ~200 ft around nursing station with RW. Patient left in bed with bed alarm set, call bell in reach, daughter in room, and all needs met. Patient will benefit from acute OT to increase overall independence in the areas of ADLs, functional mobility, in order to safely discharge home.       Recommendations for follow up therapy are one component of a multi-disciplinary discharge planning process, led by the attending physician.  Recommendations may be updated based on patient status, additional functional criteria and  insurance authorization.   Follow Up Recommendations  No OT follow up    Assistance Recommended at Discharge Intermittent Supervision/Assistance  Patient can return home with the following A little help with walking and/or transfers;A little help with bathing/dressing/bathroom;Direct supervision/assist for financial management;Help with stairs or ramp for entrance;Assist for transportation;Direct supervision/assist for medications management;Assistance with cooking/housework    Functional Status Assessment  Patient has had a recent decline in their functional status and demonstrates the ability to make significant improvements in function in a reasonable and predictable amount of time.  Equipment Recommendations  Other (comment);None recommended by OT    Recommendations for Other Services       Precautions / Restrictions Precautions Precautions: Fall Restrictions Weight Bearing Restrictions: No      Mobility Bed Mobility Overal bed mobility: Independent                  Transfers Overall transfer level: Independent Equipment used: Rolling walker (2 wheels)                      Balance Overall balance assessment: Modified Independent                                         ADL either performed or assessed with clinical judgement   ADL Overall ADL's : Needs assistance/impaired                         Toilet Transfer: Supervision/safety  Pertinent Vitals/Pain Pain Assessment Pain Assessment: No/denies pain        Extremity/Trunk Assessment Upper Extremity Assessment Upper Extremity Assessment: Generalized weakness   Lower Extremity Assessment Lower Extremity Assessment: Generalized weakness       Communication Communication Communication: Prefers language other than English   Cognition Arousal/Alertness: Awake/alert Behavior During Therapy: WFL for tasks assessed/performed Overall  Cognitive Status: History of cognitive impairments - at baseline                                                  Home Living Family/patient expects to be discharged to:: Private residence Living Arrangements: Children Available Help at Discharge: Family;Home health;Available PRN/intermittently Type of Home: House Home Access: Stairs to enter [employer] of Steps: 6 Entrance Stairs-Rails: Right;Left Home Layout: Two level Alternate Level Stairs-Number of Steps: 6   Bathroom Shower/Tub: Tub/shower unit         Home Equipment: Conservation officer, nature (2 wheels);Shower seat - built in   Additional Comments: Daughter stated patient had a SPC but was left in [place].      Prior Functioning/Environment Prior Level of Function : Independent/Modified Independent             Mobility Comments: householdAMB with RW ADLs Comments: modI to supervision, some memory difficulty        OT Problem List: Decreased strength;Decreased activity tolerance;Decreased safety awareness      OT Treatment/Interventions: Self-care/ADL training;Therapeutic exercise;Patient/family education;Energy conservation;DME and/or AE instruction;Cognitive remediation/compensation    OT Goals(Current goals can be found in the care plan section) Acute Rehab OT Goals Patient Stated Goal: none stated OT Goal Formulation: With patient Time For Goal Achievement: 12/05/21 Potential to Achieve Goals: Good ADL Goals Pt Will Perform Grooming: with min assist Pt Will Perform Lower Body Bathing: with min assist Pt Will Perform Lower Body Dressing: with min assist Pt Will Transfer to Toilet: with supervision Pt Will Perform Toileting - Clothing Manipulation and hygiene: with supervision  OT Frequency: Min 2X/week       AM-PAC OT "6 Clicks" Daily Activity     Outcome Measure Help from another person eating meals?: None Help from another person taking care of personal grooming?: A  Little Help from another person toileting, which includes using toliet, bedpan, or urinal?: A Little Help from another person bathing (including washing, rinsing, drying)?: A Little Help from another person to put on and taking off regular upper body clothing?: None Help from another person to put on and taking off regular lower body clothing?: A Little 6 Click Score: 20   End of Session Equipment Utilized During Treatment: Rolling walker (2 wheels) Nurse Communication: Mobility status  Activity Tolerance: Patient tolerated treatment well Patient left: in bed;with call bell/phone within reach;with bed alarm set;with family/visitor present  OT Visit Diagnosis: Muscle weakness (generalized) (M62.81)                Time: 1438-1500 OT Time Calculation (min): 22 min Charges:  OT General Charges $OT Visit: 1 Visit OT Evaluation $OT Eval Low Complexity: 1 Low OT Treatments $Self Care/Home Management : 8-22 mins    Haidyn Chadderdon, OTS 11/21/2021, 4:27 PM

## 2021-11-21 NOTE — TOC Progression Note (Signed)
Transition of Care [facility]) - Progression Note    Patient Details  Name: Michael [name] MRN: [id number] Date of Birth: 1933-03-20  Transition [facility]) CM/SW Contact  Beverly Sessions, RN Phone Number: 11/21/2021, 2:02 PM  Clinical Narrative:        Per Rachel Bo with ID it is anticipated that patient will need a short course of IV antibiotics at discharge -Tommi Rumps with [facility] notified - Heads up referral made to [facility] with [employer]     Expected Discharge Plan and Services                                                 Social Determinants of Health (SDOH) Interventions    Readmission Risk Interventions     No data to display

## 2021-11-21 NOTE — Progress Notes (Signed)
I spoke to patient's daughter regarding the patient's recent admission to the hospital.  As per the daughter patient is feeling better/stabilized at this time.  GB

## 2021-11-21 NOTE — Assessment & Plan Note (Signed)
Nutrition Status: Nutrition Problem: Severe Malnutrition Etiology: cancer and cancer related treatments Signs/Symptoms: severe fat depletion, severe muscle depletion     Seen by nutrition.  Diet liberalized.

## 2021-11-21 NOTE — Assessment & Plan Note (Signed)
Replacing as needed 

## 2021-11-21 NOTE — Assessment & Plan Note (Signed)
Status post Foley catheter placement

## 2021-11-21 NOTE — Inpatient Diabetes Management (Signed)
Inpatient Diabetes Program Recommendations  AACE/ADA: New Consensus Statement on Inpatient Glycemic Control   Target Ranges:  Prepandial:   less than 140 mg/dL      Peak postprandial:   less than 180 mg/dL (1-2 hours)      Critically ill patients:  140 - 180 mg/dL    Latest Reference Range & Units 11/20/21 09:10 11/20/21 11:29 11/20/21 16:16 11/20/21 21:14 11/21/21 08:21  Glucose-Capillary 70 - 99 mg/dL 240 (H) 160 (H) 276 (H) 253 (H) 275 (H)   Review of Glycemic Control  Diabetes history: DM2 Outpatient Diabetes medications: Amaryl 2 mg QAM, Metformin 1000 mg BID, Actos 30 mg daily, Januvia 100 mg daily Current orders for Inpatient glycemic control: Novolog 0-15 units TID with meals, Novolog 0-5 units QHS; Solumedrol 40 mg daily  Inpatient Diabetes Program Recommendations:    Insulin: If steroids are continued as ordered, please consider ordering Semglee 10 units Q24H.  NOTE: Patient admitted with sepsis secondary to UTI (bladder mass secondary to metastatic prostate cancer), acute blood loss anemia, pneumonia due to COVID, and syncope. In reviewing chart, noted patient sees Dr. Gabriel Carina (Endocrinologist) and was last seen on 11/04/21. Per office note on 11/04/21, "Januvia has not been effective. Will DC Januvia. Replace with pioglitazone 15 mg daily. Continue metformin and glimepiride as prescribed."  Thanks, Barnie Alderman, RN, MSN, [place] [facility] Coordinator Inpatient Diabetes Program [phone] (Team Pager from 8am to [place])

## 2021-11-21 NOTE — Assessment & Plan Note (Addendum)
Combination of acute blood loss anemia plus anemia of chronic disease from prostate cancer.  Getting IV iron.

## 2021-11-22 ENCOUNTER — Other Ambulatory Visit (HOSPITAL_COMMUNITY): Payer: Self-pay

## 2021-11-22 DIAGNOSIS — B9620 Unspecified Escherichia coli [E. coli] as the cause of diseases classified elsewhere: Secondary | ICD-10-CM

## 2021-11-22 DIAGNOSIS — R7881 Bacteremia: Secondary | ICD-10-CM

## 2021-11-22 DIAGNOSIS — E872 Acidosis, unspecified: Secondary | ICD-10-CM

## 2021-11-22 DIAGNOSIS — E43 Unspecified severe protein-calorie malnutrition: Secondary | ICD-10-CM | POA: Diagnosis not present

## 2021-11-22 DIAGNOSIS — N390 Urinary tract infection, site not specified: Secondary | ICD-10-CM | POA: Diagnosis not present

## 2021-11-22 DIAGNOSIS — A419 Sepsis, unspecified organism: Secondary | ICD-10-CM | POA: Diagnosis not present

## 2021-11-22 DIAGNOSIS — C61 Malignant neoplasm of prostate: Secondary | ICD-10-CM | POA: Diagnosis not present

## 2021-11-22 LAB — BASIC METABOLIC PANEL
Anion gap: 11 (ref 5–15)
BUN: 26 mg/dL — ABNORMAL HIGH (ref 8–23)
CO2: 23 mmol/L (ref 22–32)
Calcium: 7.8 mg/dL — ABNORMAL LOW (ref 8.9–10.3)
Chloride: 102 mmol/L (ref 98–111)
Creatinine, Ser: 1.04 mg/dL (ref 0.61–1.24)
GFR, Estimated: >60 mL/min (ref >60)
Glucose, Bld: 366 mg/dL — ABNORMAL HIGH (ref 70–99)
Potassium: 3.4 mmol/L — ABNORMAL LOW (ref 3.5–5.1)
Sodium: 136 mmol/L (ref 135–145)

## 2021-11-22 LAB — PROCALCITONIN: Procalcitonin: 7.48 ng/mL

## 2021-11-22 LAB — GLUCOSE, CAPILLARY
Glucose-Capillary: 365 mg/dL — ABNORMAL HIGH (ref 70–99)
Glucose-Capillary: 462 mg/dL — ABNORMAL HIGH (ref 70–99)
Glucose-Capillary: 346 mg/dL — ABNORMAL HIGH (ref 70–99)
Glucose-Capillary: 246 mg/dL — ABNORMAL HIGH (ref 70–99)
Glucose-Capillary: 135 mg/dL — ABNORMAL HIGH (ref 70–99)

## 2021-11-22 LAB — CBC
HCT: 39.1 % (ref 39.0–52.0)
Hemoglobin: 13.2 g/dL (ref 13.0–17.0)
MCH: 29.5 pg (ref 26.0–34.0)
MCHC: 33.8 g/dL (ref 30.0–36.0)
MCV: 87.5 fL (ref 80.0–100.0)
Platelets: 173 10*3/uL (ref 150–400)
RBC: 4.47 MIL/uL (ref 4.22–5.81)
RDW: 14.6 % (ref 11.5–15.5)
WBC: 11.5 10*3/uL — ABNORMAL HIGH (ref 4.0–10.5)
nRBC: 0 % (ref 0.0–0.2)

## 2021-11-22 LAB — C-REACTIVE PROTEIN: CRP: 4.2 mg/dL — ABNORMAL HIGH (ref <1.0)

## 2021-11-22 LAB — LACTIC ACID, PLASMA: Lactic Acid, Venous: 2.9 mmol/L (ref 0.5–1.9)

## 2021-11-22 MED ORDER — PREDNISONE 20 MG PO TABS
40.0000 mg | ORAL_TABLET | Freq: Every day | ORAL | Status: DC
  Administered 2021-11-23: 40 mg via ORAL
  Filled 2021-11-22: qty 2

## 2021-11-22 MED ORDER — SODIUM CHLORIDE 0.9% FLUSH: 10.0000 mL | INTRAVENOUS | Status: DC | PRN

## 2021-11-22 MED ORDER — INSULIN GLARGINE-YFGN 100 UNIT/ML ~~LOC~~ SOLN
10.0000 [IU] | Freq: Every day | SUBCUTANEOUS | Status: DC
  Administered 2021-11-22: 10 [IU] via SUBCUTANEOUS
  Filled 2021-11-22: qty 0.1

## 2021-11-22 MED ORDER — SODIUM CHLORIDE 0.9 % IV SOLN: INTRAVENOUS | Status: DC

## 2021-11-22 MED ORDER — INSULIN ASPART 100 UNIT/ML IJ SOLN
0.0000 [IU] | Freq: Three times a day (TID) | INTRAMUSCULAR | Status: DC
  Administered 2021-11-22: 7 [IU] via SUBCUTANEOUS
  Administered 2021-11-22: 25 [IU] via SUBCUTANEOUS
  Administered 2021-11-23 (×2): 20 [IU] via SUBCUTANEOUS
  Filled 2021-11-22 (×6): qty 1

## 2021-11-22 MED ORDER — SODIUM CHLORIDE 0.9% FLUSH
10.0000 mL | Freq: Two times a day (BID) | INTRAVENOUS | Status: DC
  Administered 2021-11-22 – 2021-11-23 (×3): 10 mL

## 2021-11-22 MED ORDER — INSULIN ASPART 100 UNIT/ML IJ SOLN: 0.0000 [IU] | Freq: Every day | INTRAMUSCULAR | Status: DC

## 2021-11-22 NOTE — Consult Note (Signed)
PHARMACY CONSULT NOTE FOR:  OUTPATIENT  PARENTERAL ANTIBIOTIC THERAPY (OPAT)  Indication: ESBL E.coli bacteremia and complicated UTI Regimen: Ertapenem 1 gram IV every 24 hours End date: 11/29/21  IV antibiotic discharge orders are pended. To discharging provider:  please sign these orders via discharge navigator,  Select New Orders & click on the button choice - Manage This Unsigned Work.     Thank you for allowing pharmacy to be a part of this patient's care.  Benita Gutter 11/22/2021, 5:44 PM

## 2021-11-22 NOTE — Progress Notes (Signed)
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  Patient noted to have 3 days of gross hematuria after Foley cath anchored urology office.  Patient found to have sepsis secondary to ESBL E. coli UTI as well as positive for COVID infection.  Started on IV antibiotics and fluids.  Seen by infectious disease who are recommending a total of 10 to 14 days of IV meropenem.  Midline placed 10/3.  Assessment and Plan: Assessment and Plan: * Severe sepsis ([place]) secondary to ESBL E. coli UTI Patient met criteria for severe sepsis on admission secondary to lactic acidosis, tachycardia, tachypnea and urinary source with positive blood cultures for ESBL E. coli.  On IV meropenem which he will need until 10/10.  Sepsis since stabilized.  PICC line placed.  ABLA (acute blood loss anemia) Hematuria secondary to traumatic catheterization and bladder mass with resultant hematuria.  Transfused 1 unit packed red blood cells initially.  Pneumonia due to COVID-19 virus Continue Paxlovid.  Passed swallow evaluation.  Patient not hypoxic.  Some upper respiratory wheezing.  CRP level minimal.  Changing IV steroids to Qwick p.o. prednisone taper.   Primary prostate cancer with metastasis from prostate to other site [facility]) Status post radiation to spine.  Urology following.   Syncope and collapse Likely vasovagal syncope secondary to acute infection with sepsis Continue management of sepsis above Neurologic checks, continuous cardiac monitoring  Acute urinary retention Status post Foley catheter placement  Protein-calorie  malnutrition, severe Nutrition Status: Nutrition Problem: Severe Malnutrition Etiology: cancer and cancer related treatments Signs/Symptoms: severe fat depletion, severe muscle depletion     Seen by nutrition.  Diet liberalized.  Essential hypertension Initially held antihypertensives due to soft blood pressures on arrival.  Now restarted.  We will try some diuresis, likely elevated due to fluid resuscitation.    Coronary artery disease involving native coronary artery of native heart without angina pectoris Continue metoprolol.  Continue atorvastatin.  Not currently on aspirin  Type 2 diabetes mellitus without complication, with long-term current use of insulin (HCC) CBGs elevated in part due to steroids.  On sliding scale plus normally on oral Actos, metformin and glipizide.  A1c notes good control at 7.2.  Started on low-dose Lantus while on steroids.  Since she is going home on quick prednisone taper, can likely discontinue Lantus upon discharge.  Iron deficiency anemia Combination of acute blood loss anemia plus anemia of chronic disease from prostate cancer.  Getting IV iron.  Hypomagnesemia Replacing as needed.  Hematuria Secondary to Foley catheter placement and likely from metastatic prostate cancer to bladder.  Lactic acidosis Initial lactic acid elevated secondary to severe sepsis.  However, improved as infection was treated, but then repeat lactic acid level checked on 10/3 and noted to still be elevated, likely from intravascular volume depletion, likely from hyperglycemia causing diuresis and patient received some IV Lasix on 10/2.  Have stopped Lasix and treating high blood sugars and patient given some IV fluids.  Repeat lactate in the morning.  Infection clearly improving given significantly improved procalcitonin level and normalized white blood cell count.       Body mass index is  20.25 kg/m.  Nutrition Problem: Severe Malnutrition Etiology: cancer and cancer  related treatments     Consultants: Urology Oncology Infectious disease  Procedures: Status post 1 unit packed red blood cell transfusion Echocardiogram Midline placement  Antimicrobials: IV meropenem 9/29-10/10  Code Status: Full code   Subjective: Spoke to patient through interpreter.  No complaints today Objective: Vital signs were reviewed and unremarkable. Vitals:   11/22/21 1605 11/22/21 1959  BP: (!) 152/95 (!) 151/88  Pulse: 83 78  Resp: 16 20  Temp: 97.9 F (36.6 C) 97.6 F (36.4 C)  SpO2: 98% 98%    Intake/Output Summary (Last 24 hours) at 11/22/2021 2203 Last data filed at 11/22/2021 1800 Gross per 24 hour  Intake 2078.06 ml  Output 5775 ml  Net -3696.94 ml    Filed Weights   11/19/21 1300 11/22/21 0500  Weight: 54.9 kg 56.9 kg   Body mass index is 20.25 kg/m.  Exam:  General: Alert and oriented x2, no acute distress HEENT: Normocephalic, atraumatic, mucous membranes are moist Cardiovascular: Regular rate and rhythm, S1-S2 Respiratory: Clear to auscultation bilaterally, less upper airway wheezing Abdomen: Soft, nontender, nondistended, positive bowel sounds Musculoskeletal: No clubbing or cyanosis or edema Skin: No skin breaks, tears or lesions Psychiatry: Appropriate, no evidence of psychoses Neurology: No focal deficits  Data Reviewed: Higher CBGs.  Lactic acid level 2.9.  Procalcitonin level down to 7.  White blood cell count of 11  Disposition:  Status is: Inpatient Remains inpatient appropriate because:  Continued Paxlovid -Follow-up on lactic acid level -Setting up home health antibiotics    Anticipated discharge date: 10/4  Family Communication: Updated daughter by phone 10/3 DVT Prophylaxis: SCDs Start: 11/19/21 0244    Author: Annita Brod ,MD 11/22/2021 10:03 PM  To reach On-call, see care teams to locate the attending and reach out via [email or web address]. Between 7PM-7AM, please contact night-coverage If you still  have difficulty reaching the attending provider, please page the [facility] (Director on Call) for [facility] on amion for assistance.

## 2021-11-22 NOTE — TOC Progression Note (Addendum)
Transition of Care [facility]) - Progression Note    Patient Details  Name: Michael [name] MRN: [id number] Date of Birth: 18-Jan-1934  Transition of Care [facility]) CM/SW Contact  Beverly Sessions, RN Phone Number: 11/22/2021, 2:40 PM  Clinical Narrative:     Per ID note patient will require 10-14 days of IV antibiotics.  Pam with Ysidro Evert and Tommi Rumps with Alvis Lemmings updated        Expected Discharge Plan and Services                                                 Social Determinants of Health (SDOH) Interventions    Readmission Risk Interventions     No data to display

## 2021-11-22 NOTE — Progress Notes (Signed)
   Date of Admission:  11/18/2021   Total days of antibiotics ***        Day ***        Day ***        Day ***   ID: Michael [name] is a 88 y.o. male with  *** Principal Problem:   Severe sepsis (HCC) secondary to ESBL E. coli UTI Active Problems:   Essential hypertension   Coronary artery disease involving native coronary artery of native heart without angina pectoris   Primary prostate cancer with metastasis from prostate to other site [facility])   Protein-calorie malnutrition, severe   UTI due to extended-spectrum beta lactamase (ESBL) producing Escherichia coli   Type 2 diabetes mellitus without complication, with long-term current use of insulin (HCC)   Syncope and collapse   Pneumonia due to COVID-19 virus   Hematuria   ABLA (acute blood loss anemia)   Acute urinary retention   Iron deficiency anemia   Hypomagnesemia    Subjective: ***  Medications:   [START ON 11/25/2021] atorvastatin  10 mg Oral q1800   Chlorhexidine Gluconate Cloth  6 each Topical Daily   feeding supplement (NEPRO CARB STEADY)  237 mL Oral TID BM   insulin aspart  0-20 Units Subcutaneous TID WC   insulin aspart  0-5 Units Subcutaneous QHS   insulin glargine-yfgn  10 Units Subcutaneous QHS   Ipratropium-Albuterol  1 puff Inhalation Q6H   iron polysaccharides  150 mg Oral Daily   metoprolol succinate  25 mg Oral Daily   multivitamin with minerals  1 tablet Oral Daily   nirmatrelvir/ritonavir EUA  3 tablet Oral BID   [START ON 11/23/2021] predniSONE  40 mg Oral Q breakfast   sodium chloride flush  10-40 mL Intracatheter Q12H    Objective: Vital signs in last 24 hours: Temp:  [97.7 F (36.5 C)-97.9 F (36.6 C)] 97.9 F (36.6 C) (10/03 1605) Pulse Rate:  [72-83] 83 (10/03 1605) Resp:  [16-19] 16 (10/03 1605) BP: (146-163)/(90-95) 152/95 (10/03 1605) SpO2:  [97 %-99 %] 98 % (10/03 1605) Weight:  [56.9 kg] 56.9 kg (10/03 0500)  LDA Foley Central lines Other catheters  PHYSICAL EXAM:   General: Alert, cooperative, no distress, appears stated age.  Head: Normocephalic, without obvious abnormality, atraumatic. Eyes: Conjunctivae clear, anicteric sclerae. Pupils are equal ENT Nares normal. No drainage or sinus tenderness. Lips, mucosa, and tongue normal. No Thrush Neck: Supple, symmetrical, no adenopathy, thyroid: non tender no carotid bruit and no JVD. Back: No CVA tenderness. Lungs: Clear to auscultation bilaterally. No Wheezing or Rhonchi. No rales. Heart: Regular rate and rhythm, no murmur, rub or gallop. Abdomen: Soft, non-tender,not distended. Bowel sounds normal. No masses Extremities: atraumatic, no cyanosis. No edema. No clubbing Skin: No rashes or lesions. Or bruising Lymph: Cervical, supraclavicular normal. Neurologic: Grossly non-focal  Lab Results Recent Labs    11/21/21 0628 11/22/21 0627  WBC 17.2* 11.5*  HGB 12.3* 13.2  HCT 36.4* 39.1  NA 137 136  K 3.7 3.4*  CL 107 102  CO2 22 23  BUN 22 26*  CREATININE 1.05 1.04   Liver Panel No results for input(s): "PROT", "ALBUMIN", "AST", "ALT", "ALKPHOS", "BILITOT", "BILIDIR", "IBILI" in the last 72 hours. Sedimentation Rate No results for input(s): "ESRSEDRATE" in the last 72 hours. C-Reactive Protein Recent Labs    11/22/21 0915  CRP 4.2*    Microbiology:  Studies/Results: No results found.   Assessment/Plan: ***

## 2021-11-22 NOTE — Treatment Plan (Signed)
Diagnosis: ESBL e.coli bacteremia and complicated UTI Baseline Creatinine <1    No Known Allergies  OPAT Orders Discharge antibiotics: Ertapenem 1 gram Iv every 24 hours Duration: 10 days End Date: 11/29/21   Midline  Care Per Protocol:  Labs weekly while on IV antibiotics: _X_ CBC with differential  _X_ CMP   _X_ Please pull PIC at completion of IV antibiotics   Fax weekly lab results  promptly to [phone]  Clinic Follow Up Appt: PRN   Call [phone] with nay questions

## 2021-11-22 NOTE — Progress Notes (Signed)
Physical Therapy Treatment Patient Details Name: Michael [name] MRN: [id number] DOB: 01-02-1934 Today's Date: 11/22/2021   History of Present Illness Michael [name] is an 69yoM who comes to [facility] on 11/18/21 after syncope and collapse x1 minute while being assisted to BR. Workup shows hypotension, tachycardia, tachypnea, (+) UA c E. coli ESBL, PNA 2/2 COVID. 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  Imaging studies in May this year show metastatic changes to cervical and thoracic spine with areas of cord compression C7-T4, however DTR denies knowledge of any specific impairment pertaining to these findings. Pt has ABLA from attempts to place foley after [place] at urology office showed >400cc post-void retention. Pt is now s/p PRBC infusion. No prior episodes of syncope.    PT Comments    Attempted to use [place] interpreter but call failed twice with second interpreter stating poor wifi signal on hospital side prior to her disconnecting.  Pt's son-in-law in room and assisted with interpreting.  Of note, during chart review noted pt with earlier run of SVT and elevated BG, confirmed with Dr. [place] Pink ok to work with patient without restriction.  Pt was pleasant and motivated to participate during the session and put forth good effort throughout.  Pt required some general cuing for sequencing with functional mobility but was generally steady with good control and stability.  Pt eager to ambulate and continued to request addition loops around the nursing station.  Vitals monitored frequently with SpO2 100% on room air and HR up from a baseline of the low 70s to the mid 90s during gait with no adverse symptoms. Stair assessment deferred secondary to pt with multiple lines/leads/tubes, somewhat impulsive, and no medical interpreter available.  Son-in-law stated no concerns with pt's ability to  navigate stairs based on walking how he walked in the hallway.  Surgical mask donned on patient during the session.  Pt will benefit from HHPT upon discharge to safely address deficits listed in patient problem list for decreased caregiver assistance and eventual return to PLOF.     Recommendations for follow up therapy are one component of a multi-disciplinary discharge planning process, led by the attending physician.  Recommendations may be updated based on patient status, additional functional criteria and insurance authorization.  Follow Up Recommendations  Home health PT     Assistance Recommended at Discharge Intermittent Supervision/Assistance  Patient can return home with the following A little help with walking and/or transfers;A little help with bathing/dressing/bathroom;Assistance with cooking/housework;Assist for transportation;Help with stairs or ramp for entrance   Equipment Recommendations  None recommended by PT    Recommendations for Other Services       Precautions / Restrictions Precautions Precautions: Fall Restrictions Weight Bearing Restrictions: No     Mobility  Bed Mobility Overal bed mobility: Needs Assistance Bed Mobility: Supine to Sit, Sit to Supine     Supine to sit: Supervision Sit to supine: Supervision   General bed mobility comments: Verbal cues to attend to foley    Transfers Overall transfer level: Needs assistance Equipment used: Rolling walker (2 wheels) Transfers: Sit to/from Stand Sit to Stand: Supervision           General transfer comment: Good concentric control and fair eccentric control    Ambulation/Gait Ambulation/Gait assistance: Supervision Gait Distance (Feet): 600 Feet Assistive device: Rolling walker (2 wheels) Gait Pattern/deviations: Step-through pattern, Decreased stride length Gait velocity: decreased     General  Gait Details: Good control and stability during ambulation with SpO2 and HR WNL on room air  and no adverse symptoms   Stairs             Wheelchair Mobility    Modified Rankin (Stroke Patients Only)       Balance Overall balance assessment: No apparent balance deficits (not formally assessed)   Sitting balance-Leahy Scale: Normal     Standing balance support: Bilateral upper extremity supported, During functional activity Standing balance-Leahy Scale: Good                              Cognition Arousal/Alertness: Awake/alert Behavior During Therapy: WFL for tasks assessed/performed Overall Cognitive Status: History of cognitive impairments - at baseline                                          Exercises      General Comments        Pertinent Vitals/Pain Pain Assessment Pain Assessment: No/denies pain    Home Living                          Prior Function            PT Goals (current goals can now be found in the care plan section) Progress towards PT goals: Progressing toward goals    Frequency    Min 2X/week      PT Plan Current plan remains appropriate    Co-evaluation              AM-PAC PT "6 Clicks" Mobility   Outcome Measure  Help needed turning from your back to your side while in a flat bed without using bedrails?: A Little Help needed moving from lying on your back to sitting on the side of a flat bed without using bedrails?: A Little Help needed moving to and from a bed to a chair (including a wheelchair)?: A Little Help needed standing up from a chair using your arms (e.g., wheelchair or bedside chair)?: A Little Help needed to walk in hospital room?: A Little Help needed climbing 3-5 steps with a railing? : A Little 6 Click Score: 18    End of Session Equipment Utilized During Treatment: Gait belt Activity Tolerance: Patient tolerated treatment well Patient left: in bed;with call bell/phone within reach;with family/visitor present;with bed alarm set Nurse  Communication: Mobility status PT Visit Diagnosis: Difficulty in walking, not elsewhere classified (R26.2);Muscle weakness (generalized) (M62.81)     Time: 1610-9604 PT Time Calculation (min) (ACUTE ONLY): 42 min  Charges:  $Gait Training: 8-22 mins $Therapeutic Activity: 23-37 mins                    D. Scott Arnika Larzelere PT, DPT 11/22/21, 4:21 PM

## 2021-11-22 NOTE — Inpatient Diabetes Management (Signed)
Inpatient Diabetes Program Recommendations  AACE/ADA: New Consensus Statement on Inpatient Glycemic Control   Target Ranges:  Prepandial:   less than 140 mg/dL      Peak postprandial:   less than 180 mg/dL (1-2 hours)      Critically ill patients:  140 - 180 mg/dL    Latest Reference Range & Units 11/22/21 06:27  Glucose 70 - 99 mg/dL 366 (H)    Latest Reference Range & Units 11/21/21 08:21 11/21/21 13:44 11/21/21 17:30 11/21/21 21:19  Glucose-Capillary 70 - 99 mg/dL 275 (H) 265 (H) 246 (H) 198 (H)   Review of Glycemic Control  Diabetes history: DM2 Outpatient Diabetes medications: Amaryl 2 mg QAM, Metformin 1000 mg BID, Actos 30 mg daily, Januvia 100 mg daily Current orders for Inpatient glycemic control: Semglee 5 units QHS, Novolog 0-15 units TID with meals, Novolog 0-5 units QHS; Solumedrol 40 mg daily   Inpatient Diabetes Program Recommendations:     Insulin: If steroids are continued as ordered, please consider increasing Semglee to 10 units QHS and ordering Novolog 4 units TID with meals for meal coverage if patient eats at least 50% of meals.  Thanks, Barnie Alderman, RN, MSN, [facility] Diabetes Coordinator Inpatient Diabetes Program [phone] (Team Pager from 8am to [place])

## 2021-11-22 NOTE — Progress Notes (Signed)
I visited the patient this morning.  He seems to be in better spirits/clinically improved.   Defer management as per the primary team.  Appreciate the care given I . left a voicemail for the daughter with an update.  No charges.

## 2021-11-22 NOTE — Progress Notes (Signed)

## 2021-11-22 NOTE — Progress Notes (Signed)
Provider Notification:  [place] Pink MD  Pt had 6 seconds of SVT but he is asymptomatic and denies any chest pain at this point.   Orders receives:  Awaiting on provider.

## 2021-11-22 NOTE — Evaluation (Addendum)
Clinical/Bedside Swallow Evaluation Patient Details  Name: Michael [name] MRN: [id number] Date of Birth: 1933/06/04  Today's Date: 11/22/2021 Time: SLP Start Time (ACUTE ONLY): 7616 SLP Stop Time (ACUTE ONLY): 0925 SLP Time Calculation (min) (ACUTE ONLY): 50 min  Past Medical History:  Past Medical History:  Diagnosis Date   Anemia    iron deficiency   Aortic atherosclerosis (HCC)    Bilateral carotid artery disease (HCC)    Mild plaque formation without obstructive disease noted on carotid Doppler.   Diabetes mellitus without complication ([place])    Essential hypertension    GERD (gastroesophageal reflux disease)    Hyperlipidemia    Hypertension    Left carotid artery stenosis    Nonobstructive Coronary artery disease    a. Previous cardiac catheterization at [facility] in 2010. The patient was told about 2 blockages which did not require revascularization; b. 01/2015 Cath: LM nl, LAD 10p/m, 43md, D2 30ost, LCX nl, RCA min irregs, EF 55-65%; b. 12/2018 MV: No ischemia/infarct. CT images w/ mod 3 vessel Cor Ca2+ and mild-mod Ao atherosclerosis.   [place] 03[place]   Raynaud disease    a. Managed w/ nifedipine.   Past Surgical History:  Past Surgical History:  Procedure Laterality Date   CARDIAC CATHETERIZATION  2010   CARDIAC CATHETERIZATION N/A 02/03/2015   Procedure: Left Heart Cath and Coronary Angiography;  Surgeon: MWellington [name] MD;  [facility];  Service: Cardiovascular;  Laterality: N/A;   CATARACT EXTRACTION Bilateral    CYSTOSCOPY W/ RETROGRADES Bilateral 04/11/2017   Procedure: CYSTOSCOPY WITH RETROGRADE PYELOGRAM;  Surgeon: BHollice Espy MD;  Location: [facility];  Service: Urology;  Laterality: Bilateral;   CYSTOSCOPY W/ RETROGRADES Bilateral 09/12/2017   Procedure: CYSTOSCOPY WITH RETROGRADE PYELOGRAM;  Surgeon: BHollice Espy MD;  Location: [facility];  Service: Urology;  Laterality: Bilateral;   CYSTOSCOPY W/ RETROGRADES Bilateral  02/25/2018   Procedure: CYSTOSCOPY WITH RETROGRADE PYELOGRAM;  Surgeon: BHollice Espy MD;  Location: [facility];  Service: Urology;  Laterality: Bilateral;   CYSTOSCOPY W/ URETERAL STENT PLACEMENT Bilateral 09/12/2017   Procedure: CYSTOSCOPY WITH STENT REPLACEMENT;  Surgeon: BHollice Espy MD;  Location: [facility];  Service: Urology;  Laterality: Bilateral;   CYSTOSCOPY W/ URETERAL STENT REMOVAL Bilateral 02/25/2018   Procedure: CYSTOSCOPY WITH STENT REMOVAL;  Surgeon: BHollice Espy MD;  Location: [facility];  Service: Urology;  Laterality: Bilateral;   CYSTOSCOPY WITH STENT PLACEMENT Left 04/11/2017   Procedure: CYSTOSCOPY WITH STENT PLACEMENT and fulgeration;  Surgeon: BHollice Espy MD;  Location: [facility];  Service: Urology;  Laterality: Left;   CYSTOSCOPY WITH URETHRAL DILATATION Bilateral 04/11/2017   Procedure: CYSTOSCOPY WITH URETHRAL DILATATION;  Surgeon: BHollice Espy MD;  Location: [facility];  Service: Urology;  Laterality: Bilateral;   IR CONVERT RIGHT NEPHROSTOMY TO NEPHROURETERAL CATH  05/21/2017   IR NEPHROSTOMY PLACEMENT RIGHT  04/13/2017   HPI:  Pt is a 88y.o. 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  History is given mostly by daughter at bedside who stated that patient who had been coughing since COVID back in August appeared to have worsening cough congestion and wheezing over the past 3 days.  He was otherwise in his usual  state of health and went to his urologist earlier in the day when he was being taught to self catheterize, during the instruction he developed hematuria which continued for several hours after he went home.   Later on when being helped to the bathroom he slumped down became diaphoretic and lost consciousness.  After regaining consciousness he complained of low back pain and pain in his penis.  He otherwise had no nausea or vomiting, abdominal pain or diarrhea.  Lab indicated Positive for COVID this admit.  CT of Chest/Abd: Nonspecific patchy ground-glass and centrilobular micro  nodularity with upper lobe predominance is likely  infectious/inflammatory not significantly changed from prior.  Similar to slight increase in indeterminate mediastinal and left  hilar adenopathy.  Widespread osseous metastases.    Assessment / Plan / Recommendation  Clinical Impression   Pt seen for BSE this morning post report of difficulty swallowing Pills w/ water. Family has reported such at home also, and difficulty swallowing when using a straw.  Pt awake, verbal and engaged easily w/ family and staff in room. Pt speaks min [place]; Daughter present and assisted during session, per pt request. Pt on RA; afebrile. He is on a Regular consistency diet.   Pt appears to present w/ adequate oropharyngeal phase swallow function w/ No overt oropharyngeal phase dysphagia noted, No neuromuscular deficits noted when following general aspiration precautions including NO Straw use when drinking. Pt consumed po trials w/ No immediate, overt, clinical s/s of aspiration during po trials. Pt appears at reduced risk for aspiration following general aspiration precautions and reducing distractions during meals, including talking. Discussed increasing focus to the eating/drinking task at hand using Single sips slowly. Pt demonstrated understanding of precautions and exhibited good follow through w/ instructions.   During po trials, pt consumed all consistencies w/ no overt coughing, decline in vocal quality, or change in respiratory presentation during/post trials. Oral phase appeared grossly [facility] w/ timely bolus management, mastication, and  control of bolus propulsion for A-P transfer for swallowing. Oral clearing achieved w/ all trial consistencies -- moistened, soft foods given.  OM Exam appeared [facility] w/ no unilateral weakness noted. Speech Clear. Pt fed self w/ setup support.  Education was given on Pills WHOLE in Puree for safer swallowing than w/ liquids as pt has had swallowing difficulty w/ Pills w/ liquids. NSG present. Pt swallowed each Pill WHOLE in Puree w/out difficulty. He and family seemed pleased w/ this new option for swallowing Pills more safely.  Recommend continue a Regular consistency diet w/ well-Cut meats, moistened foods; Thin liquids -- VIA CUP; NO STRAW USE. Recommend general aspiration precautions, Pills WHOLE in Puree for safer, easier swallowing -- this was encouraged for D/C and family agreed.  Thorough Education given on Pills in Puree; food consistencies and easy to eat options; general aspiration precautions and reducing distractions during meals for increased focus to swallowing for both pt and Dtr, family in room. NSG to reconsult if any new needs arise. NSG updated, agreed. MD updated.  SLP Visit Diagnosis: Dysphagia, unspecified (R13.10)    Aspiration Risk   (reduced following general aspiration precautions)    Diet Recommendation    Regular consistency diet w/ well-Cut meats, moistened foods; Thin liquids -- VIA CUP; NO STRAW USE. Recommend general aspiration precautions, reducing distractions during meals for increased focus to swallowing. Recommend monitoring fatigue during meals and choose foods easy to eat for conservation of energy and d/t Edentulous status.  Medication Administration: Whole meds with puree (for safer swallowing)  Other  Recommendations Recommended Consults:  (Dietician f/u as needed) Oral Care Recommendations: Oral care BID;Oral care before and after PO;Patient independent with oral care (setup) Other Recommendations:  (n/a)    Recommendations for follow up therapy are one  component of a multi-disciplinary discharge planning process, led by the attending physician.  Recommendations may be updated based on patient status, additional functional criteria and insurance authorization.  Follow up Recommendations No SLP follow up      Assistance Recommended at Discharge None  Functional Status Assessment Patient has had a recent decline in their functional status and demonstrates the ability to make significant improvements in function in a reasonable and predictable amount of time.  Frequency and Duration  (n/a)   (n/a)       Prognosis Prognosis for Safe Diet Advancement: Good Barriers to Reach Goals: Time post onset;Severity of deficits Barriers/Prognosis Comment: coughing w/ liquids appears related to behaviors      Swallow Study   General Date of Onset: 11/18/21 HPI: Pt is a 88 y.o. 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  History is given mostly by daughter at bedside who stated that patient who had been coughing since COVID back in August appeared to have worsening cough congestion and wheezing over the past 3 days.  He was otherwise in his usual state of health and went to his urologist earlier in the day when he was being taught to self catheterize, during the instruction he developed hematuria which continued for several hours after he went home.  Later on when being helped to the bathroom he slumped down became diaphoretic and lost consciousness.  After regaining consciousness he complained of low back pain and pain in his penis.  He otherwise had no nausea or vomiting, abdominal pain or diarrhea.  Lab  indicated Positive for COVID this admit.  CT of Chest/Abd: Nonspecific patchy ground-glass and centrilobular micro  nodularity with upper lobe predominance is likely  infectious/inflammatory not significantly changed from prior.  Similar to slight increase in indeterminate mediastinal and left  hilar adenopathy.  Widespread osseous metastases. Type of Study: Bedside Swallow Evaluation Previous Swallow Assessment: none Diet Prior to this Study: Regular;Thin liquids Temperature Spikes Noted: No (wbc 11.5) Respiratory Status: Room air History of Recent Intubation: No Behavior/Cognition: Alert;Cooperative;Pleasant mood Oral Cavity Assessment: Within Functional Limits Oral Care Completed by SLP: Recent completion by staff Oral Cavity - Dentition: Adequate natural dentition Vision: Functional for self-feeding Self-Feeding Abilities: Able to feed self;Needs set up Patient Positioning: Upright in bed Baseline Vocal Quality: Normal Volitional Cough: Strong Volitional Swallow: Able to elicit    Oral/Motor/Sensory Function Overall Oral Motor/Sensory Function: Within functional limits   Ice Chips Ice chips: Not tested   Thin Liquid Thin Liquid: Within functional limits Presentation: Cup;Self Fed (10+ trials) Other Comments: Dtr noted coughing w/ straw use    Nectar Thick Nectar Thick Liquid: Not tested   Honey Thick Honey Thick Liquid: Not tested   Puree Puree: Within functional limits Presentation: Self Fed;Spoon (10 trials) Other Comments: including w/ meds   Solid     Solid: Within functional limits Presentation: Self Fed (4-5 trials)        Orinda Kenner, [place], CCC-SLP  Speech Language Pathologist Rehab Services; [place] [phone] ([email or web address]) Carmelita Amparo 11/22/2021,11:53 AM

## 2021-11-22 NOTE — Plan of Care (Signed)
  Problem: Elimination: Goal: Will not experience complications related to urinary retention Outcome: Progressing   Problem: Pain Managment: Goal: General experience of comfort will improve Outcome: Progressing   Problem: Safety: Goal: Ability to remain free from injury will improve Outcome: Progressing   Problem: Skin Integrity: Goal: Risk for impaired skin integrity will decrease Outcome: Progressing   Problem: Respiratory: Goal: Will maintain a patent airway Outcome: Progressing

## 2021-11-22 NOTE — Progress Notes (Signed)
1129: CBG 462. 25 units novolog insulin admin per Gevena Barre, MD.   Recheck CBG at 1332 per Gevena Barre, MD order: [place], MD made aware.

## 2021-11-22 NOTE — Assessment & Plan Note (Signed)
Initial lactic acid elevated secondary to severe sepsis.  However, improved as infection was treated, but then repeat lactic acid level checked on 10/3 and noted to still be elevated, likely from intravascular volume depletion, likely from hyperglycemia causing diuresis and patient received some IV Lasix on 10/2.  Have stopped Lasix and treating high blood sugars and patient given some IV fluids.  Repeat lactate in the morning.  Infection clearly improving given significantly improved procalcitonin level and normalized white blood cell count.

## 2021-11-23 DIAGNOSIS — B9620 Unspecified Escherichia coli [E. coli] as the cause of diseases classified elsewhere: Secondary | ICD-10-CM

## 2021-11-23 DIAGNOSIS — R7881 Bacteremia: Secondary | ICD-10-CM | POA: Diagnosis not present

## 2021-11-23 DIAGNOSIS — C61 Malignant neoplasm of prostate: Secondary | ICD-10-CM | POA: Diagnosis not present

## 2021-11-23 DIAGNOSIS — N390 Urinary tract infection, site not specified: Secondary | ICD-10-CM | POA: Diagnosis not present

## 2021-11-23 LAB — LACTIC ACID, PLASMA
Lactic Acid, Venous: 2.1 mmol/L (ref 0.5–1.9)
Lactic Acid, Venous: 4.5 mmol/L (ref 0.5–1.9)

## 2021-11-23 LAB — BASIC METABOLIC PANEL
Anion gap: 6 (ref 5–15)
BUN: 28 mg/dL — ABNORMAL HIGH (ref 8–23)
CO2: 25 mmol/L (ref 22–32)
Calcium: 7.5 mg/dL — ABNORMAL LOW (ref 8.9–10.3)
Chloride: 105 mmol/L (ref 98–111)
Creatinine, Ser: 0.96 mg/dL (ref 0.61–1.24)
GFR, Estimated: >60 mL/min (ref >60)
Glucose, Bld: 287 mg/dL — ABNORMAL HIGH (ref 70–99)
Potassium: 3.2 mmol/L — ABNORMAL LOW (ref 3.5–5.1)
Sodium: 136 mmol/L (ref 135–145)

## 2021-11-23 LAB — CULTURE, BLOOD (ROUTINE X 2): Culture: NO GROWTH

## 2021-11-23 LAB — PROCALCITONIN: Procalcitonin: 3.53 ng/mL

## 2021-11-23 LAB — GLUCOSE, CAPILLARY
Glucose-Capillary: 364 mg/dL — ABNORMAL HIGH (ref 70–99)
Glucose-Capillary: 387 mg/dL — ABNORMAL HIGH (ref 70–99)

## 2021-11-23 MED ORDER — INSULIN ASPART 100 UNIT/ML IJ SOLN
4.0000 [IU] | Freq: Three times a day (TID) | INTRAMUSCULAR | Status: DC
  Administered 2021-11-23 (×2): 4 [IU] via SUBCUTANEOUS

## 2021-11-23 MED ORDER — INSULIN GLARGINE-YFGN 100 UNIT/ML ~~LOC~~ SOLN
15.0000 [IU] | Freq: Every day | SUBCUTANEOUS | Status: DC
  Filled 2021-11-23: qty 0.15

## 2021-11-23 MED ORDER — POTASSIUM CHLORIDE CRYS ER 10 MEQ PO TBCR: 10.0000 meq | EXTENDED_RELEASE_TABLET | Freq: Every day | ORAL | 0 refills | Status: DC

## 2021-11-23 MED ORDER — ERTAPENEM IV (FOR PTA / DISCHARGE USE ONLY): 1.0000 g | INTRAVENOUS | 0 refills | Status: AC

## 2021-11-23 MED ORDER — POTASSIUM CHLORIDE CRYS ER 20 MEQ PO TBCR
40.0000 meq | EXTENDED_RELEASE_TABLET | Freq: Once | ORAL | Status: AC
  Administered 2021-11-23: 40 meq via ORAL
  Filled 2021-11-23: qty 2

## 2021-11-23 MED ORDER — LISINOPRIL 2.5 MG PO TABS
2.5000 mg | ORAL_TABLET | Freq: Every day | ORAL | Status: DC
  Administered 2021-11-23: 2.5 mg via ORAL
  Filled 2021-11-23: qty 1

## 2021-11-23 NOTE — TOC Progression Note (Signed)
Transition of Care [facility]) - Progression Note    Patient Details  Name: Michael [name] MRN: [id number] Date of Birth: May 15, 1933  Transition of Care [facility]) CM/SW Contact  Beverly Sessions, RN Phone Number: 11/23/2021, 9:39 AM  Clinical Narrative:     Secure chat sent to MD to determine if patient is medically ready for DC.  Will update [employer] and [facility] with response .       Expected Discharge Plan and Services                                                 Social Determinants of Health (SDOH) Interventions    Readmission Risk Interventions     No data to display

## 2021-11-23 NOTE — Discharge Summary (Addendum)
Physician Discharge Summary  KYL GIVLER YQI:[id number] DOB: Oct 23, 1933 DOA: 11/18/2021  PCP: [name], MD  Admit date: 11/18/2021 Discharge date: 11/23/2021  Admitted From: home  Disposition:  home w/ home health   Recommendations for Outpatient Follow-up:  Follow up with PCP in 1-2 weeks F/u w/ urology, Dr. Louis Meckel, in 1 week  Home Health: yes Equipment/Devices: PICC line; foley  Discharge Condition: stable  CODE STATUS: full  Diet recommendation: Heart Healthy / Carb Modified  Brief/Interim Summary: HPI was taken from Dr. Damita Dunnings: [name] is a 88 y.o. 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  History is given mostly by daughter at bedside who stated that patient who had been coughing since COVID back in August appeared to have worsening cough congestion and wheezing over the past 3 days.  He was otherwise in his usual state of health and went to his urologist earlier in the day when he was being taught to self catheterize, during the instruction he developed hematuria which continued for several hours after he went home.  Later on when being helped to the bathroom he slumped down became diaphoretic and lost consciousness.  After regaining consciousness he complained of low back pain and pain in his penis.  He otherwise had no nausea or vomiting, abdominal pain or diarrhea. ED course and data review: Tmax 98.3 with pulse 1 10-1 51, respirations up to 37.  BP initially soft on arrival at 107/62, fluid responsive but then became hypertensive to 195/98.  O2 sat 98-100 on room air., EKG, personally viewed and  interpreted showing sinus tachycardia at 114 with no acute ST-T wave changes.  CT chest abdomen and pelvis with contrast with the following findings IMPRESSION: 1. Diffuse bladder wall thickening and possible urothelial hyperenhancement of the ureters. Recommend correlation with urinalysis for cystitis. 2. Wall thickening about the rectum suggestive of proctitis. 3. Increased size of the focal anterior midline bladder wall mass suspicious for metastasis versus primary neoplasm. 4. Nonspecific patchy ground-glass and centrilobular micro nodularity with upper lobe predominance is likely infectious/inflammatory not significantly changed from prior. 5. Similar to slight increase in indeterminate mediastinal and left hilar adenopathy. 6. Widespread osseous metastases. 7. Coronary artery and Aortic Atherosclerosis (ICD10-I70.0).     Patient treated with sepsis fluids and IV cefepime.  Multiple was also administered DuoNebs and methylprednisolone in the ED.  Hospitalist consulted for admission  As per Dr. Maryland Pink: 88 year old male with past medical history of advanced metastatic prostate cancer status post radiation for spinal cord compression as well as diabetes, hypertension and ESBL UTI who presented to the emergency department after an episode of syncope.  Patient noted to have 3 days of gross hematuria after Foley cath anchored urology office.  Patient found to have sepsis secondary to ESBL E. coli UTI as well as positive for COVID infection.  Started on IV antibiotics and fluids.  Seen by infectious disease who are recommending a total of 10 to 14 days of IV meropenem.  Midline placed 10/3.   As per Dr. Jimmye Norman 11/23/21: Pt was medically stable for d/c. Pt was d/c home w/ PICC line and IV abxs until 11/29/21 as per ID. Pt was also d/c home  w/ foley and will f/u w/ urology, Dr. Louis Meckel, in 1 week. For more information, please see previous progress/consult notes.    Discharge Diagnoses:   Principal Problem:   Severe sepsis ([place]) secondary to ESBL E. coli UTI Active Problems:   Complicated UTI (urinary tract infection)   ABLA (acute blood loss anemia)   Pneumonia due to COVID-19 virus   Primary prostate cancer with metastasis from prostate to other site [facility])   Syncope and collapse   Protein-calorie malnutrition, severe   Acute urinary retention   Essential hypertension   Coronary artery disease involving native coronary artery of native heart without angina pectoris   Type 2 diabetes mellitus without complication, with long-term current use of insulin (HCC)   Iron deficiency anemia   Hematuria   Hypomagnesemia   Lactic acidosis   Bacteremia due to Escherichia coli  Severe sepsis: secondary to ESBL E. coli UTI. Met criteria for severe sepsis on admission secondary to lactic acidosis, tachycardia, tachypnea and urinary source with positive blood cultures for ESBL E. coli.  On IV ertapenem which he will need until 11/29/21 as per ID.  PICC line placed. Severe sepsis resolved   Bacteremia: secondary to ESBL e. coli. Likely secondary to UTI, Continue on IV ertapenem until 11/29/21 as per ID.   UTI: secondary to ESBL e.coli. Continue on IV abxs   Acute blood loss anemia: w/ hematuria secondary to traumatic catheterization and bladder mass with resultant hematuria. S/p 1 unit of pRBCs transfused this admission    Pneumonia due to COVID-19 virus: continue on paxlovid, steroids, bronchodilators. Encourage incentive spirometry    Prostate cancer: with metastasis. S/p radiation to the spine.   Syncope and collapse: likely vasovagal syncope secondary to acute infection w/ sepsis    Acute urinary retention: s/p foley placement. Continue w/ foley at d/c as per urology. Needs outpatient urology f/u    Severe protein-calorie malnutrition: continue on nutritional supplements  HTN: continue on metoprolol    Hx of CAD: continue on metoprolol, statin    DM2: fairly well  controlled, HbA1c 7.2. Continue on home anti-DM meds at d/c   IDA: w/ component of acute blood loss anemia & ACD from prostate cancer.    Hypomagnesemia: WNL    Hematuria: secondary to foley catheter placement and likely from metastatic prostate cancer to bladder. Continue w/ foley at d/c as per urology. Will need outpatient urology f/u    Lactic acidosis: likely secondary to severe sepsis vs prostate cancer. Vitals are stable. WBC is trending down. No fevers   Discharge Instructions  Discharge Instructions     [facility] pharmacist to adjust dose for Vancomycin, Aminoglycosides and other anti-infective therapies as requested by physician.   Complete by: As directed    [facility] to provide Cath Flo 2mg    Complete by: As directed    Administer for PICC line occlusion and as ordered by physician for other access device issues.   Anaphylaxis Kit: Provided to treat any anaphylactic reaction to the medication being provided to the patient if First Dose or when requested by physician   Complete by: As directed    Epinephrine 1mg /ml vial / amp: Administer 0.3mg  (0.42ml) subcutaneously once for moderate to severe anaphylaxis, nurse to call physician and pharmacy when reaction occurs and call 911 if needed for immediate care   Diphenhydramine 50mg /ml IV vial: Administer 25-50mg  IV/IM PRN for first dose reaction, rash, itching, mild reaction, nurse to call physician and pharmacy when reaction occurs  Sodium Chloride 0.9% NS 520ml IV: Administer if needed for hypovolemic blood pressure drop or as ordered by physician after call to physician with anaphylactic reaction   Change dressing on IV access line weekly and PRN   Complete by: As directed    Diet - low sodium heart healthy   Complete by: As directed    Diet Carb Modified   Complete by: As directed    Discharge instructions   Complete by: As directed    F/u w/ PCP in 1 week. F/u w/ urology, Dr. Louis Meckel, in 1 week.  F/u w/ oncology, Dr. Rogue Bussing, in 1-2 weeks   Flush IV access with Sodium Chloride 0.9% and Heparin 10 units/ml or 100 units/ml   Complete by: As directed    Home infusion instructions - [facility]   Complete by: As directed    Instructions: Flush IV access with Sodium Chloride 0.9% and Heparin 10units/ml or 100units/ml   Change dressing on IV access line: Weekly and PRN   Instructions Cath Flo $Remove'2mg'[id number]$ : Administer for PICC Line occlusion and as ordered by physician for other access device   [facility] pharmacist to adjust dose for: Vancomycin, Aminoglycosides and other anti-infective therapies as requested by physician   Increase activity slowly   Complete by: As directed    Method of administration may be changed at the discretion of home infusion pharmacist based upon assessment of the patient and/or caregiver's ability to self-administer the medication ordered   Complete by: As directed       Allergies as of 11/23/2021   No Known Allergies      Medication List     STOP taking these medications    azithromycin 250 MG tablet Commonly known as: ZITHROMAX   benzonatate 100 MG capsule Commonly known as: TESSALON   dexamethasone 2 MG tablet Commonly known as: DECADRON       TAKE these medications    acetaminophen 500 MG tablet Commonly known as: TYLENOL Take 500 mg by mouth every 4 (four) hours as needed for moderate pain or fever.   albuterol 108 (90 Base) MCG/ACT inhaler Commonly known as: VENTOLIN HFA Inhale 2 puffs into the lungs every 4 (four) hours as needed for wheezing or shortness of breath.   atorvastatin 10 MG tablet Commonly known as: LIPITOR Take 10 mg by mouth daily at 6 PM.   Calcium 600+D3 600-20 MG-MCG Tabs Generic drug: Calcium Carb-Cholecalciferol Take 1 tablet by mouth every evening.   ertapenem  IVPB Commonly known as: INVANZ Inject 1 g into the vein daily for 7 days. Indication:  ESBL E.coli bacteremia and complicated  UTI First Dose: No Last Day of Therapy:  11/29/21 Labs - Once weekly:  CBC/D and CMP (Fax weekly lab results promptly to [phone]) Call [phone] with any questions Method of administration: Mini-Bag Plus / Gravity Method of administration may be changed at the discretion of home infusion pharmacist based upon assessment of the patient and/or caregiver's ability to self-administer the medication ordered.   glimepiride 2 MG tablet Commonly known as: AMARYL Take 2 mg by mouth daily with breakfast.   glucose blood test strip Commonly known as: Accu-Chek Guide Use asdirected three times a day diag E11.65   ibuprofen 200 MG tablet Commonly known as: ADVIL Take 200 mg by mouth every 6 (six) hours as needed for fever or moderate pain.   iron polysaccharides 150 MG capsule Commonly known as: NIFEREX Take 150 mg by mouth daily.   lisinopril 2.5  MG tablet Commonly known as: ZESTRIL Take 2.5 mg by mouth daily.   metFORMIN 1000 MG tablet Commonly known as: GLUCOPHAGE Take 1,000 mg by mouth 2 (two) times daily.   metoprolol succinate 25 MG 24 hr tablet Commonly known as: TOPROL-XL TAKE 1 TABLET BY MOUTH EVERY DAY FOR BLOOD PRESSURE   multivitamin with minerals tablet Take 1 tablet by mouth daily.   Myrbetriq 25 MG Tb24 tablet Generic drug: mirabegron ER TAKE 1 TABLET (25 MG TOTAL) BY MOUTH DAILY.   NIFEdipine 30 MG 24 hr tablet Commonly known as: ADALAT CC TAKE 1 TABLET BY MOUTH EVERY DAY   omeprazole 20 MG capsule Commonly known as: PRILOSEC TAKE ONE CAPSULE BY MOUTH EVERY DAY   oxyCODONE 5 MG immediate release tablet Commonly known as: Oxy IR/ROXICODONE Take 1 tablet (5 mg total) by mouth every 4 (four) hours as needed for severe pain.   pioglitazone 30 MG tablet Commonly known as: ACTOS Take 30 mg by mouth daily.   potassium chloride 10 MEQ tablet Commonly known as: KLOR-CON M Take 1 tablet (10 mEq total) by mouth daily for 3 days.   sitaGLIPtin 100 MG  tablet Commonly known as: JANUVIA Take 100 mg by mouth daily.   sucralfate 1 g tablet Commonly known as: CARAFATE TAKE 1 TABLET (1 G TOTAL) BY MOUTH 4 TIMES A DAY WITH MEALS AND AT BEDTIME   traMADol 50 MG tablet Commonly known as: ULTRAM Take 50 mg by mouth every 8 (eight) hours as needed for moderate pain or severe pain.   Xtandi 40 MG tablet Generic drug: enzalutamide Take 2 tablets (80 mg total) by mouth daily.               Discharge Care Instructions  (From admission, onward)           Start     Ordered   11/23/21 0000  Change dressing on IV access line weekly and PRN  (Home infusion instructions - [facility] )        11/23/21 1253            Follow-up Information     Ardis Hughs, MD Follow up in 1 week(s).   Specialty: Urology Contact information: [place] [address] [phone]         [name], MD Follow up in 1 week(s).   Specialty: Internal Medicine Contact information: [address] [place] [address] [phone]         Wellington [name], MD .   Specialty: Cardiology Contact information: [place] [address] [phone]                No Known Allergies  Consultations: Urology  ID  Oncology    Procedures/Studies: ECHOCARDIOGRAM COMPLETE  Result Date: 11/21/2021    ECHOCARDIOGRAM REPORT   Patient Name:   JAZPER NIKOLAI Date of Exam: 11/20/2021 Medical Rec #:  [id number]           Height:       66.0 in Accession #:    [id number]          Weight:       121.0 lb Date of Birth:  [birth year]           BSA:          1.615 m Patient Age:    34 years  BP:           129/73 mmHg Patient Gender: M                   HR:           157 bpm. Exam Location:  [facility] Procedure: 2D Echo, Color Doppler and Cardiac Doppler Indications:     I50.31 congestive heart failure-Acute Diastolic  History:          Patient has prior history of Echocardiogram examinations, most                  recent 02/03/2019. CAD; Risk Factors:Hypertension, Diabetes and                  Dyslipidemia. Pt tested positive for COVID-19 on 11/19/21.  Sonographer:     Humphrey Rolls Referring Phys:  [id number] Marrion Coy Diagnosing Phys: Lorine Bears MD  Sonographer Comments: Technically difficult study due to poor echo windows. Image acquisition challenging due to respiratory motion. IMPRESSIONS  1. Left ventricular ejection fraction, by estimation, is 55 to 60%. The left ventricle has normal function. Left ventricular endocardial border not optimally defined to evaluate regional wall motion. Left ventricular diastolic parameters were normal.  2. Right ventricular systolic function is normal. The right ventricular size is normal. There is normal pulmonary artery systolic pressure.  3. The mitral valve is normal in structure. Mild mitral valve regurgitation. No evidence of mitral stenosis.  4. The aortic valve is normal in structure. Aortic valve regurgitation is not visualized. Aortic valve sclerosis/calcification is present, without any evidence of aortic stenosis.  5. The inferior vena cava is normal in size with <50% respiratory variability, suggesting right atrial pressure of 8 mmHg. FINDINGS  Left Ventricle: Left ventricular ejection fraction, by estimation, is 55 to 60%. The left ventricle has normal function. Left ventricular endocardial border not optimally defined to evaluate regional wall motion. The left ventricular internal cavity size was normal in size. There is no left ventricular hypertrophy. Left ventricular diastolic parameters were normal. Right Ventricle: The right ventricular size is normal. No increase in right ventricular wall thickness. Right ventricular systolic function is normal. There is normal pulmonary artery systolic pressure. The tricuspid regurgitant velocity is 2.31 m/s, and  with an assumed right atrial pressure of  8 mmHg, the estimated right ventricular systolic pressure is 29.3 mmHg. Left Atrium: Left atrial size was normal in size. Right Atrium: Right atrial size was normal in size. Pericardium: There is no evidence of pericardial effusion. Mitral Valve: The mitral valve is normal in structure. Mild mitral valve regurgitation. No evidence of mitral valve stenosis. Tricuspid Valve: The tricuspid valve is normal in structure. Tricuspid valve regurgitation is trivial. No evidence of tricuspid stenosis. Aortic Valve: The aortic valve is normal in structure. Aortic valve regurgitation is not visualized. Aortic valve sclerosis/calcification is present, without any evidence of aortic stenosis. Aortic valve mean gradient measures 3.0 mmHg. Aortic valve peak  gradient measures 4.9 mmHg. Aortic valve area, by VTI measures 2.37 cm. Pulmonic Valve: The pulmonic valve was normal in structure. Pulmonic valve regurgitation is not visualized. No evidence of pulmonic stenosis. Aorta: The aortic root is normal in size and structure. Venous: The inferior vena cava is normal in size with less than 50% respiratory variability, suggesting right atrial pressure of 8 mmHg. IAS/Shunts: No atrial level shunt detected by color flow Doppler.  LEFT VENTRICLE PLAX 2D LVIDd:         4.30 cm  Diastology LVIDs:         3.30 cm   LV e' medial:    8.59 cm/s LV PW:         0.90 cm   LV E/e' medial:  10.9 LV IVS:        0.90 cm   LV e' lateral:   9.90 cm/s LVOT diam:     1.80 cm   LV E/e' lateral: 9.5 LV SV:         49 LV SV Index:   30 LVOT Area:     2.54 cm  RIGHT VENTRICLE RV Basal diam:  3.00 cm RV S prime:     15.50 cm/s TAPSE (M-mode): 2.0 cm LEFT ATRIUM             Index        RIGHT ATRIUM           Index LA diam:        3.60 cm 2.23 cm/m   RA Area:     10.10 cm LA Vol (A2C):   29.5 ml 18.26 ml/m  RA Volume:   21.40 ml  13.25 ml/m LA Vol (A4C):   29.8 ml 18.45 ml/m LA Biplane Vol: 30.8 ml 19.07 ml/m  AORTIC VALVE AV Area (Vmax):    2.22 cm AV  Area (Vmean):   2.15 cm AV Area (VTI):     2.37 cm AV Vmax:           111.00 cm/s AV Vmean:          79.400 cm/s AV VTI:            0.207 m AV Peak Grad:      4.9 mmHg AV Mean Grad:      3.0 mmHg LVOT Vmax:         96.90 cm/s LVOT Vmean:        67.100 cm/s LVOT VTI:          0.193 m LVOT/AV VTI ratio: 0.93  AORTA Ao Root diam: 3.10 cm MITRAL VALVE               TRICUSPID VALVE MV Area (PHT): 4.71 cm    TR Peak grad:   21.3 mmHg MV Decel Time: 161 msec    TR Vmax:        231.00 cm/s MV E velocity: 93.80 cm/s MV A velocity: 98.50 cm/s  SHUNTS MV E/A ratio:  0.95        Systemic VTI:  0.19 m                            Systemic Diam: 1.80 cm [name] MD Electronically signed by [name] MD Signature Date/Time: 11/21/2021/7:58:40 AM    Final    DG Chest Port 1 View  Result Date: 11/20/2021 CLINICAL DATA:  Continued shortness of breath with wheezing. Tested positive for COVID 1 day ago. Concern for pneumonia. EXAM: PORTABLE CHEST 1 VIEW COMPARISON:  AP chest 11/19/2021, 11/18/2021, 02/25/2018 FINDINGS: Cardiac silhouette and mediastinal contours within normal limits. No significant change in mild-to-moderate interstitial thickening. Mild patchy airspace opacities bilaterally. Mild fluid within the right minor fissure is unchanged. No definite pleural effusion. No pneumothorax. Mild-to-moderate multilevel degenerative disc changes of the thoracic spine. Diffuse sclerotic bone metastases are again better seen on prior CT. IMPRESSION: No significant change in mild-to-moderate interstitial thickening and mild patchy airspace opacities bilaterally. Findings again may represent pulmonary edema  and/or multifocal pneumonia. Electronically Signed   By: Yvonne Kendall M.D.   On: 11/20/2021 10:58   DG Chest Port 1 View  Result Date: 11/19/2021 CLINICAL DATA:  Dyspnea EXAM: PORTABLE CHEST 1 VIEW COMPARISON:  Radiographs 11/18/2021 FINDINGS: Since yesterday there is increased hazy interstitial and airspace  opacities bilaterally. Stable cardiomediastinal silhouette. Thickening or fluid within the right minor fissure. Otherwise no pleural effusion or pneumothorax. Sclerotic osseous metastases. No acute osseous abnormality. IMPRESSION: Interval slight worsening of predominantly interstitial opacities bilaterally are nonspecific and may be due to edema or infection. Electronically Signed   By: Placido Sou M.D.   On: 11/19/2021 02:33   DG Chest Port 1 View  Result Date: 11/19/2021 CLINICAL DATA:  Questionable sepsis; evaluate for abnormality EXAM: PORTABLE CHEST 1 VIEW COMPARISON:  CT earlier today and radiographs 02/25/2018 FINDINGS: Stable cardiomediastinal silhouette. Increased hazy airspace and interstitial opacities bilaterally are nonspecific and may be due to infection or edema. No pleural effusion or pneumothorax. No acute osseous abnormality. Sclerotic osseous metastases better appreciated on CT. IMPRESSION: Increased hazy airspace and interstitial opacities bilaterally are nonspecific and may be due to infection or edema. Electronically Signed   By: Placido Sou M.D.   On: 11/19/2021 00:12   CT CHEST ABDOMEN PELVIS W CONTRAST  Result Date: 11/19/2021 CLINICAL DATA:  Syncopal episode; sepsis EXAM: CT CHEST, ABDOMEN, AND PELVIS WITH CONTRAST TECHNIQUE: Multidetector CT imaging of the chest, abdomen and pelvis was performed following the standard protocol during bolus administration of intravenous contrast. RADIATION DOSE REDUCTION: This exam was performed according to the departmental dose-optimization program which includes automated exposure control, adjustment of the mA and/or kV according to patient size and/or use of iterative reconstruction technique. CONTRAST:  168mL OMNIPAQUE IOHEXOL 300 MG/ML  SOLN COMPARISON:  CT 10/03/2021 FINDINGS: CT CHEST FINDINGS Cardiovascular: Coronary artery disease. Aortic atherosclerosis. Normal heart size. No pericardial effusion. Mediastinum/Nodes: Enlarged  mediastinal and left hilar lymph nodes are similar to slightly increased in size from 10/03/2021. The 12 mm subcarinal node is unchanged. Slight increase in size of left hilar nodes each measuring 9 mm. Thyroid gland, trachea, and esophagus demonstrate no significant findings. Lungs/Pleura: Mild emphysematous changes. No focal consolidation. Widespread patchy ground-glass and centrilobular micro nodularity with an upper lobe predominance. This is unchanged from prior. Mild interlobular septal thickening in the upper lobes. No pleural effusion or pneumothorax. Musculoskeletal: Diffuse sclerotic metastases in the thoracic spine, scapula, ribs, and sternum. CT ABDOMEN PELVIS FINDINGS Hepatobiliary: No focal liver abnormality is seen. No gallstones, gallbladder wall thickening, or biliary dilatation. Pancreas: Unremarkable. No pancreatic ductal dilatation or surrounding inflammatory changes. Spleen: No splenic injury or perisplenic hematoma. Adrenals/Urinary Tract: Unchanged thickening of the left adrenal gland. Normal right adrenal gland. Small hypoattenuating lesions statistically likely to represent cysts. No follow-up is required. Focal areas of cortical renal scarring bilaterally. Unchanged mild bilateral hydroureter and prominent renal pelvises. Question urothelial hyperenhancement of the ureters. Diffuse bladder wall thickening. Increased size of the nodular thickening in the anterior bladder wall measuring 1.2 x 0.9 cm, previously 1.6 x 0.9 cm. Stomach/Bowel: Stomach is within normal limits. Appendix appears normal. Question bowel wall thickening about the rectum with adjacent stranding. There are few prominent loops of jejunum at the upper limits of normal. No definite obstruction. Vascular/Lymphatic: Aortic atherosclerosis. No enlarged abdominal or pelvic lymph nodes. Reproductive: Unremarkable. Other: No free intraperitoneal fluid or air. Musculoskeletal: Diffuse osseous metastases throughout the lumbar  spine, sacrum, and pelvis. Bilateral L5 pars defects and grade 1 anterolisthesis of  L5. No acute abnormality. IMPRESSION: 1. Diffuse bladder wall thickening and possible urothelial hyperenhancement of the ureters. Recommend correlation with urinalysis for cystitis. 2. Wall thickening about the rectum suggestive of proctitis. 3. Increased size of the focal anterior midline bladder wall mass suspicious for metastasis versus primary neoplasm. 4. Nonspecific patchy ground-glass and centrilobular micro nodularity with upper lobe predominance is likely infectious/inflammatory not significantly changed from prior. 5. Similar to slight increase in indeterminate mediastinal and left hilar adenopathy. 6. Widespread osseous metastases. 7. Coronary artery and Aortic Atherosclerosis (ICD10-I70.0). Electronically Signed   By: Placido Sou M.D.   On: 11/19/2021 00:01   (Echo, Carotid, EGD, Colonoscopy, ERCP)    Subjective: Pt denies any complaints    Discharge Exam: Vitals:   11/22/21 1959 11/23/21 0300  BP: (!) 151/88 (!) 148/98  Pulse: 78 81  Resp: 20 19  Temp: 97.6 F (36.4 C) 98.7 F (37.1 C)  SpO2: 98% 95%   Vitals:   11/22/21 1959 11/23/21 0300 11/23/21 0500 11/23/21 0515  BP: (!) 151/88 (!) 148/98    Pulse: 78 81    Resp: 20 19    Temp: 97.6 F (36.4 C) 98.7 F (37.1 C)    TempSrc: Oral     SpO2: 98% 95%    Weight:   57.2 kg 58.2 kg    General: Pt is alert, awake, not in acute distress Cardiovascular: S1/S2 +, no rubs, no gallops Respiratory: CTA bilaterally, no wheezing, no rhonchi Abdominal: Soft, NT, ND, bowel sounds + Extremities:no cyanosis    The results of significant diagnostics from this hospitalization (including imaging, microbiology, ancillary and laboratory) are listed below for reference.     Microbiology: Recent Results (from the past 240 hour(s))  Blood Culture (routine x 2)     Status: None   Collection Time: 11/18/21 11:39 PM   Specimen: BLOOD  Result Value  Ref Range Status   Specimen Description BLOOD RIGHT ANTECUBITAL  Final   Special Requests   Final    BOTTLES DRAWN AEROBIC AND ANAEROBIC Blood Culture results may not be optimal due to an inadequate volume of blood received in culture bottles   Culture   Final    NO GROWTH 5 DAYS Performed at [facility], [address] [place], [place] [address]    Report Status 11/23/2021 FINAL  Final  Resp Panel by RT-PCR (Flu A&B, Covid) Anterior Nasal Swab     Status: Abnormal   Collection Time: 11/18/21 11:40 PM   Specimen: Anterior Nasal Swab  Result Value Ref Range Status   SARS Coronavirus 2 by RT PCR POSITIVE (A) NEGATIVE Final    Comment: (NOTE) SARS-CoV-2 target nucleic acids are DETECTED.  The SARS-CoV-2 RNA is generally detectable in upper respiratory specimens during the acute phase of infection. Positive results are indicative of the presence of the identified virus, but do not rule out bacterial infection or co-infection with other pathogens not detected by the test. Clinical correlation with patient history and other diagnostic information is necessary to determine patient infection status. The expected result is Negative.  Fact Sheet for Patients: EntrepreneurPulse.com.au  Fact Sheet for Healthcare Providers: IncredibleEmployment.be  This test is not yet approved or cleared by the [place] FDA and  has been authorized for detection and/or diagnosis of SARS-CoV-2 by FDA under an Emergency Use Authorization (EUA).  This EUA will remain in effect (meaning this test can be used) for the duration of  the COVID-19 declaration under Section 564(b)(1) of the A ct,  21 U.S.C. section 360bbb-3(b)(1), unless the authorization is terminated or revoked sooner.     Influenza A by PCR NEGATIVE NEGATIVE Final   Influenza B by PCR NEGATIVE NEGATIVE Final    Comment: (NOTE) The Xpert Xpress SARS-CoV-2/FLU/RSV plus assay is intended as an  aid in the diagnosis of influenza from Nasopharyngeal swab specimens and should not be used as a sole basis for treatment. Nasal washings and aspirates are unacceptable for Xpert Xpress SARS-CoV-2/FLU/RSV testing.  Fact Sheet for Patients: EntrepreneurPulse.com.au  Fact Sheet for Healthcare Providers: IncredibleEmployment.be  This test is not yet approved or cleared by the [place] FDA and has been authorized for detection and/or diagnosis of SARS-CoV-2 by FDA under an Emergency Use Authorization (EUA). This EUA will remain in effect (meaning this test can be used) for the duration of the COVID-19 declaration under Section 564(b)(1) of the Act, 21 U.S.C. section 360bbb-3(b)(1), unless the authorization is terminated or revoked.  Performed at [facility], [address], [place], [place] [address]   Blood Culture (routine x 2)     Status: Abnormal   Collection Time: 11/18/21 11:40 PM   Specimen: BLOOD  Result Value Ref Range Status   Specimen Description   Final    BLOOD LEFT ANTECUBITAL Performed at [facility], [address] [place], [place] [address]    Special Requests   Final    BOTTLES DRAWN AEROBIC AND ANAEROBIC Blood Culture results may not be optimal due to an inadequate volume of blood received in culture bottles Performed at [facility], [address] [place], [place] [address]    Culture  Setup Time   Final    Organism ID to follow Glenn CALLED TO, READ BACK BY AND VERIFIED WITH: CARISSA [facility] 11/19/21 1336 SLM Performed at [facility], [address] [place], [place] [address]    Culture (A)  Final    ESCHERICHIA COLI Confirmed Extended Spectrum Beta-Lactamase Producer (ESBL).  In bloodstream infections from ESBL organisms, carbapenems are preferred over piperacillin/tazobactam. They are shown to have a lower risk of  mortality.    Report Status 11/21/2021 FINAL  Final   Organism ID, Bacteria ESCHERICHIA COLI  Final      Susceptibility   Escherichia coli - MIC*    AMPICILLIN >=32 RESISTANT Resistant     CEFAZOLIN >=64 RESISTANT Resistant     CEFEPIME 0.5 SENSITIVE Sensitive     CEFTAZIDIME RESISTANT Resistant     CEFTRIAXONE >=64 RESISTANT Resistant     CIPROFLOXACIN >=4 RESISTANT Resistant     GENTAMICIN >=16 RESISTANT Resistant     IMIPENEM <=0.25 SENSITIVE Sensitive     TRIMETH/SULFA >=320 RESISTANT Resistant     AMPICILLIN/SULBACTAM <=2 SENSITIVE Sensitive     PIP/TAZO <=4 SENSITIVE Sensitive     * ESCHERICHIA COLI  Urine Culture     Status: Abnormal   Collection Time: 11/18/21 11:40 PM   Specimen: In/Out Cath Urine  Result Value Ref Range Status   Specimen Description   Final    IN/OUT CATH URINE Performed at [facility], [address] [place], [place] [address]    Special Requests   Final    NONE Performed at [facility], [address] [place],  [address]    Culture >=100,000 COLONIES/mL ESCHERICHIA COLI (A)  Final   Report Status 11/21/2021 FINAL  Final   Organism ID, Bacteria ESCHERICHIA COLI (A)  Final  Susceptibility   Escherichia coli - MIC*    AMPICILLIN <=2 SENSITIVE Sensitive     CEFAZOLIN <=4 SENSITIVE Sensitive     CEFEPIME <=0.12 SENSITIVE Sensitive     CEFTRIAXONE <=0.25 SENSITIVE Sensitive     CIPROFLOXACIN >=4 RESISTANT Resistant     GENTAMICIN 4 SENSITIVE Sensitive     IMIPENEM <=0.25 SENSITIVE Sensitive     NITROFURANTOIN <=16 SENSITIVE Sensitive     TRIMETH/SULFA >=320 RESISTANT Resistant     AMPICILLIN/SULBACTAM <=2 SENSITIVE Sensitive     PIP/TAZO <=4 SENSITIVE Sensitive     * >=100,000 COLONIES/mL ESCHERICHIA COLI  Blood Culture ID Panel (Reflexed)     Status: Abnormal   Collection Time: 11/18/21 11:40 PM  Result Value Ref Range Status   Enterococcus faecalis NOT DETECTED NOT DETECTED Final   Enterococcus Faecium NOT  DETECTED NOT DETECTED Final   Listeria monocytogenes NOT DETECTED NOT DETECTED Final   Staphylococcus species NOT DETECTED NOT DETECTED Final   Staphylococcus aureus (BCID) NOT DETECTED NOT DETECTED Final   Staphylococcus epidermidis NOT DETECTED NOT DETECTED Final   Staphylococcus lugdunensis NOT DETECTED NOT DETECTED Final   Streptococcus species NOT DETECTED NOT DETECTED Final   Streptococcus agalactiae NOT DETECTED NOT DETECTED Final   Streptococcus pneumoniae NOT DETECTED NOT DETECTED Final   Streptococcus pyogenes NOT DETECTED NOT DETECTED Final   A.calcoaceticus-baumannii NOT DETECTED NOT DETECTED Final   Bacteroides fragilis NOT DETECTED NOT DETECTED Final   Enterobacterales DETECTED (A) NOT DETECTED Final    Comment: Enterobacterales represent a large order of gram negative bacteria, not a single organism. CRITICAL RESULT CALLED TO, READ BACK BY AND VERIFIED WITH: CARISSA [facility] 11/19/21 1336 SLM    Enterobacter cloacae complex NOT DETECTED NOT DETECTED Final   Escherichia coli DETECTED (A) NOT DETECTED Final    Comment: CRITICAL RESULT CALLED TO, READ BACK BY AND VERIFIED WITH: CARISSA DOLAN 11/19/21 1336 SLM    Klebsiella aerogenes NOT DETECTED NOT DETECTED Final   Klebsiella oxytoca NOT DETECTED NOT DETECTED Final   Klebsiella pneumoniae NOT DETECTED NOT DETECTED Final   Proteus species NOT DETECTED NOT DETECTED Final   Salmonella species NOT DETECTED NOT DETECTED Final   Serratia marcescens NOT DETECTED NOT DETECTED Final   Haemophilus influenzae NOT DETECTED NOT DETECTED Final   Neisseria meningitidis NOT DETECTED NOT DETECTED Final   Pseudomonas aeruginosa NOT DETECTED NOT DETECTED Final   Stenotrophomonas maltophilia NOT DETECTED NOT DETECTED Final   Candida albicans NOT DETECTED NOT DETECTED Final   Candida auris NOT DETECTED NOT DETECTED Final   Candida glabrata NOT DETECTED NOT DETECTED Final   Candida krusei NOT DETECTED NOT DETECTED Final   Candida parapsilosis  NOT DETECTED NOT DETECTED Final   Candida tropicalis NOT DETECTED NOT DETECTED Final   Cryptococcus neoformans/gattii NOT DETECTED NOT DETECTED Final   CTX-M ESBL DETECTED (A) NOT DETECTED Final    Comment: CRITICAL RESULT CALLED TO, READ BACK BY AND VERIFIED WITH: CARISSA DOLAN 11/19/21 1336 SLM (NOTE) Extended spectrum beta-lactamase detected. Recommend a carbapenem as initial therapy.      Carbapenem resistance IMP NOT DETECTED NOT DETECTED Final   Carbapenem resistance KPC NOT DETECTED NOT DETECTED Final   Carbapenem resistance NDM NOT DETECTED NOT DETECTED Final   Carbapenem resist OXA 48 LIKE NOT DETECTED NOT DETECTED Final   Carbapenem resistance VIM NOT DETECTED NOT DETECTED Final    Comment: Performed at [facility], [address] [place], [place] [address]  Culture, blood (Routine X 2) w Reflex to ID  Panel     Status: None (Preliminary result)   Collection Time: 11/21/21  3:16 PM   Specimen: BLOOD  Result Value Ref Range Status   Specimen Description BLOOD LEFT ANTECUBITAL  Final   Special Requests   Final    BOTTLES DRAWN AEROBIC AND ANAEROBIC Blood Culture adequate volume   Culture   Final    NO GROWTH 2 DAYS Performed at [facility], [address] [place], [place] [address]    Report Status PENDING  Incomplete  Culture, blood (Routine X 2) w Reflex to ID Panel     Status: None (Preliminary result)   Collection Time: 11/21/21  3:21 PM   Specimen: BLOOD  Result Value Ref Range Status   Specimen Description BLOOD RIGHT ANTECUBITAL  Final   Special Requests   Final    BOTTLES DRAWN AEROBIC AND ANAEROBIC Blood Culture adequate volume   Culture   Final    NO GROWTH 2 DAYS Performed at [facility], [address] [place], [place] [address]    Report Status PENDING  Incomplete     Labs: BNP (last 3 results) Recent Labs    11/20/21 0615  BNP 878.6*   Basic Metabolic Panel: Recent Labs  Lab 11/18/21 2024 11/20/21 0615  11/21/21 0628 11/22/21 0627 11/23/21 0534  NA 134* 139 137 136 136  K 3.9 3.8 3.7 3.4* 3.2*  CL 103 114* 107 102 105  CO2 20* $Remov'22 22 23 25  'NoSast$ GLUCOSE 184* 177* 249* 366* 287*  BUN $Re'19 19 22 'ARr$ 26* 28*  CREATININE 0.95 1.02 1.05 1.04 0.96  CALCIUM 8.8* 8.0* 7.9* 7.8* 7.5*  MG  --  1.4* 2.1  --   --    Liver Function Tests: Recent Labs  Lab 11/18/21 2024  AST 65*  ALT 27  ALKPHOS 72  BILITOT 0.7  PROT 7.2  ALBUMIN 3.4*   Recent Labs  Lab 11/18/21 2024  LIPASE 31   No results for input(s): "AMMONIA" in the last 168 hours. CBC: Recent Labs  Lab 11/18/21 1937 11/19/21 0416 11/19/21 2213 11/20/21 0615 11/21/21 0628 11/22/21 0627  WBC 3.9* 13.7*  --  16.8* 17.2* 11.5*  NEUTROABS 3.7  --   --   --   --   --   HGB 10.1* 8.3* 11.4* 11.5* 12.3* 13.2  HCT 31.4* 25.6* 33.7* 34.0* 36.4* 39.1  MCV 95.2 93.4  --  90.2 88.8 87.5  PLT 217 178  --  161 164 173   Cardiac Enzymes: No results for input(s): "CKTOTAL", "CKMB", "CKMBINDEX", "TROPONINI" in the last 168 hours. BNP: Invalid input(s): "POCBNP" CBG: Recent Labs  Lab 11/22/21 1332 11/22/21 1637 11/22/21 2217 11/23/21 0831 11/23/21 1219  GLUCAP 346* 246* 135* 364* 387*   D-Dimer No results for input(s): "DDIMER" in the last 72 hours. Hgb A1c No results for input(s): "HGBA1C" in the last 72 hours. Lipid Profile No results for input(s): "CHOL", "HDL", "LDLCALC", "TRIG", "CHOLHDL", "LDLDIRECT" in the last 72 hours. Thyroid function studies No results for input(s): "TSH", "T4TOTAL", "T3FREE", "THYROIDAB" in the last 72 hours.  Invalid input(s): "FREET3" Anemia work up No results for input(s): "VITAMINB12", "FOLATE", "FERRITIN", "TIBC", "IRON", "RETICCTPCT" in the last 72 hours. Urinalysis    Component Value Date/Time   COLORURINE AMBER (A) 11/18/2021 2340   APPEARANCEUR CLOUDY (A) 11/18/2021 2340   APPEARANCEUR Cloudy (A) 10/08/2018 1453   LABSPEC 1.033 (H) 11/18/2021 2340   PHURINE 6.0 11/18/2021 2340    GLUCOSEU 50 (A) 11/18/2021 2340  HGBUR MODERATE (A) 11/18/2021 2340   BILIRUBINUR NEGATIVE 11/18/2021 2340   BILIRUBINUR Negative 10/08/2018 1453   KETONESUR 5 (A) 11/18/2021 2340   PROTEINUR 100 (A) 11/18/2021 2340   UROBILINOGEN 0.2 04/05/2017 1214   NITRITE POSITIVE (A) 11/18/2021 2340   LEUKOCYTESUR SMALL (A) 11/18/2021 2340   Sepsis Labs Recent Labs  Lab 11/19/21 0416 11/20/21 0615 11/21/21 0628 11/22/21 0627  WBC 13.7* 16.8* 17.2* 11.5*   Microbiology Recent Results (from the past 240 hour(s))  Blood Culture (routine x 2)     Status: None   Collection Time: 11/18/21 11:39 PM   Specimen: BLOOD  Result Value Ref Range Status   Specimen Description BLOOD RIGHT ANTECUBITAL  Final   Special Requests   Final    BOTTLES DRAWN AEROBIC AND ANAEROBIC Blood Culture results may not be optimal due to an inadequate volume of blood received in culture bottles   Culture   Final    NO GROWTH 5 DAYS Performed at [facility], [address] [place], [place] [address]    Report Status 11/23/2021 FINAL  Final  Resp Panel by RT-PCR (Flu A&B, Covid) Anterior Nasal Swab     Status: Abnormal   Collection Time: 11/18/21 11:40 PM   Specimen: Anterior Nasal Swab  Result Value Ref Range Status   SARS Coronavirus 2 by RT PCR POSITIVE (A) NEGATIVE Final    Comment: (NOTE) SARS-CoV-2 target nucleic acids are DETECTED.  The SARS-CoV-2 RNA is generally detectable in upper respiratory specimens during the acute phase of infection. Positive results are indicative of the presence of the identified virus, but do not rule out bacterial infection or co-infection with other pathogens not detected by the test. Clinical correlation with patient history and other diagnostic information is necessary to determine patient infection status. The expected result is Negative.  Fact Sheet for Patients: BloggerCourse.com  Fact Sheet for Healthcare  Providers: SeriousBroker.it  This test is not yet approved or cleared by the [place] FDA and  has been authorized for detection and/or diagnosis of SARS-CoV-2 by FDA under an Emergency Use Authorization (EUA).  This EUA will remain in effect (meaning this test can be used) for the duration of  the COVID-19 declaration under Section 564(b)(1) of the A ct, 21 U.S.C. section 360bbb-3(b)(1), unless the authorization is terminated or revoked sooner.     Influenza A by PCR NEGATIVE NEGATIVE Final   Influenza B by PCR NEGATIVE NEGATIVE Final    Comment: (NOTE) The Xpert Xpress SARS-CoV-2/FLU/RSV plus assay is intended as an aid in the diagnosis of influenza from Nasopharyngeal swab specimens and should not be used as a sole basis for treatment. Nasal washings and aspirates are unacceptable for Xpert Xpress SARS-CoV-2/FLU/RSV testing.  Fact Sheet for Patients: BloggerCourse.com  Fact Sheet for Healthcare Providers: SeriousBroker.it  This test is not yet approved or cleared by the [place] FDA and has been authorized for detection and/or diagnosis of SARS-CoV-2 by FDA under an Emergency Use Authorization (EUA). This EUA will remain in effect (meaning this test can be used) for the duration of the COVID-19 declaration under Section 564(b)(1) of the Act, 21 U.S.C. section 360bbb-3(b)(1), unless the authorization is terminated or revoked.  Performed at [facility], [address] [place], [place] [address]   Blood Culture (routine x 2)     Status: Abnormal   Collection Time: 11/18/21 11:40 PM   Specimen: BLOOD  Result Value Ref Range Status   Specimen Description   Final  BLOOD LEFT ANTECUBITAL Performed at [facility], [address] [place], [place] [address]    Special Requests   Final    BOTTLES DRAWN AEROBIC AND ANAEROBIC Blood Culture results may not be optimal due  to an inadequate volume of blood received in culture bottles Performed at [facility], [address] [place], [place] [address]    Culture  Setup Time   Final    Organism ID to follow [place] CRITICAL RESULT CALLED TO, READ BACK BY AND VERIFIED WITH: CARISSA [facility] 11/19/21 1336 SLM Performed at [facility], [address] [place], [place] [address]    Culture (A)  Final    ESCHERICHIA COLI Confirmed Extended Spectrum Beta-Lactamase Producer (ESBL).  In bloodstream infections from ESBL organisms, carbapenems are preferred over piperacillin/tazobactam. They are shown to have a lower risk of mortality.    Report Status 11/21/2021 FINAL  Final   Organism ID, Bacteria ESCHERICHIA COLI  Final      Susceptibility   Escherichia coli - MIC*    AMPICILLIN >=32 RESISTANT Resistant     CEFAZOLIN >=64 RESISTANT Resistant     CEFEPIME 0.5 SENSITIVE Sensitive     CEFTAZIDIME RESISTANT Resistant     CEFTRIAXONE >=64 RESISTANT Resistant     CIPROFLOXACIN >=4 RESISTANT Resistant     GENTAMICIN >=16 RESISTANT Resistant     IMIPENEM <=0.25 SENSITIVE Sensitive     TRIMETH/SULFA >=320 RESISTANT Resistant     AMPICILLIN/SULBACTAM <=2 SENSITIVE Sensitive     PIP/TAZO <=4 SENSITIVE Sensitive     * ESCHERICHIA COLI  Urine Culture     Status: Abnormal   Collection Time: 11/18/21 11:40 PM   Specimen: In/Out Cath Urine  Result Value Ref Range Status   Specimen Description   Final    IN/OUT CATH URINE Performed at [facility], [address] [place], [place] [address]    Special Requests   Final    NONE Performed at [facility], [address] [place], [place] [address]    Culture >=100,000 COLONIES/mL ESCHERICHIA COLI (A)  Final   Report Status 11/21/2021 FINAL  Final   Organism ID, Bacteria ESCHERICHIA COLI (A)  Final      Susceptibility   Escherichia coli - MIC*    AMPICILLIN <=2 SENSITIVE Sensitive     CEFAZOLIN <=4  SENSITIVE Sensitive     CEFEPIME <=0.12 SENSITIVE Sensitive     CEFTRIAXONE <=0.25 SENSITIVE Sensitive     CIPROFLOXACIN >=4 RESISTANT Resistant     GENTAMICIN 4 SENSITIVE Sensitive     IMIPENEM <=0.25 SENSITIVE Sensitive     NITROFURANTOIN <=16 SENSITIVE Sensitive     TRIMETH/SULFA >=320 RESISTANT Resistant     AMPICILLIN/SULBACTAM <=2 SENSITIVE Sensitive     PIP/TAZO <=4 SENSITIVE Sensitive     * >=100,000 COLONIES/mL ESCHERICHIA COLI  Blood Culture ID Panel (Reflexed)     Status: Abnormal   Collection Time: 11/18/21 11:40 PM  Result Value Ref Range Status   Enterococcus faecalis NOT DETECTED NOT DETECTED Final   Enterococcus Faecium NOT DETECTED NOT DETECTED Final   Listeria monocytogenes NOT DETECTED NOT DETECTED Final   Staphylococcus species NOT DETECTED NOT DETECTED Final   Staphylococcus aureus (BCID) NOT DETECTED NOT DETECTED Final   Staphylococcus epidermidis NOT DETECTED NOT DETECTED Final   Staphylococcus lugdunensis NOT DETECTED NOT DETECTED Final   Streptococcus species NOT DETECTED NOT DETECTED Final   Streptococcus agalactiae NOT DETECTED NOT DETECTED Final   Streptococcus  pneumoniae NOT DETECTED NOT DETECTED Final   Streptococcus pyogenes NOT DETECTED NOT DETECTED Final   A.calcoaceticus-baumannii NOT DETECTED NOT DETECTED Final   Bacteroides fragilis NOT DETECTED NOT DETECTED Final   Enterobacterales DETECTED (A) NOT DETECTED Final    Comment: Enterobacterales represent a large order of gram negative bacteria, not a single organism. CRITICAL RESULT CALLED TO, READ BACK BY AND VERIFIED WITH: CARISSA [facility] 11/19/21 1336 SLM    Enterobacter cloacae complex NOT DETECTED NOT DETECTED Final   Escherichia coli DETECTED (A) NOT DETECTED Final    Comment: CRITICAL RESULT CALLED TO, READ BACK BY AND VERIFIED WITH: CARISSA DOLAN 11/19/21 1336 SLM    Klebsiella aerogenes NOT DETECTED NOT DETECTED Final   Klebsiella oxytoca NOT DETECTED NOT DETECTED Final   Klebsiella  pneumoniae NOT DETECTED NOT DETECTED Final   Proteus species NOT DETECTED NOT DETECTED Final   Salmonella species NOT DETECTED NOT DETECTED Final   Serratia marcescens NOT DETECTED NOT DETECTED Final   Haemophilus influenzae NOT DETECTED NOT DETECTED Final   Neisseria meningitidis NOT DETECTED NOT DETECTED Final   Pseudomonas aeruginosa NOT DETECTED NOT DETECTED Final   Stenotrophomonas maltophilia NOT DETECTED NOT DETECTED Final   Candida albicans NOT DETECTED NOT DETECTED Final   Candida auris NOT DETECTED NOT DETECTED Final   Candida glabrata NOT DETECTED NOT DETECTED Final   Candida krusei NOT DETECTED NOT DETECTED Final   Candida parapsilosis NOT DETECTED NOT DETECTED Final   Candida tropicalis NOT DETECTED NOT DETECTED Final   Cryptococcus neoformans/gattii NOT DETECTED NOT DETECTED Final   CTX-M ESBL DETECTED (A) NOT DETECTED Final    Comment: CRITICAL RESULT CALLED TO, READ BACK BY AND VERIFIED WITH: CARISSA DOLAN 11/19/21 1336 SLM (NOTE) Extended spectrum beta-lactamase detected. Recommend a carbapenem as initial therapy.      Carbapenem resistance IMP NOT DETECTED NOT DETECTED Final   Carbapenem resistance KPC NOT DETECTED NOT DETECTED Final   Carbapenem resistance NDM NOT DETECTED NOT DETECTED Final   Carbapenem resist OXA 48 LIKE NOT DETECTED NOT DETECTED Final   Carbapenem resistance VIM NOT DETECTED NOT DETECTED Final    Comment: Performed at [facility], [address] [place], [place] [address]  Culture, blood (Routine X 2) w Reflex to ID Panel     Status: None (Preliminary result)   Collection Time: 11/21/21  3:16 PM   Specimen: BLOOD  Result Value Ref Range Status   Specimen Description BLOOD LEFT ANTECUBITAL  Final   Special Requests   Final    BOTTLES DRAWN AEROBIC AND ANAEROBIC Blood Culture adequate volume   Culture   Final    NO GROWTH 2 DAYS Performed at [facility], [address] [place],  [address]    Report Status  PENDING  Incomplete  Culture, blood (Routine X 2) w Reflex to ID Panel     Status: None (Preliminary result)   Collection Time: 11/21/21  3:21 PM   Specimen: BLOOD  Result Value Ref Range Status   Specimen Description BLOOD RIGHT ANTECUBITAL  Final   Special Requests   Final    BOTTLES DRAWN AEROBIC AND ANAEROBIC Blood Culture adequate volume   Culture   Final    NO GROWTH 2 DAYS Performed at [facility], [address] [place],  [address]    Report Status PENDING  Incomplete     Time coordinating discharge: Over 30 minutes  SIGNED:   Wyvonnia Dusky, MD  [facility] 11/23/2021, 1:00 PM Pager  If 7PM-7AM, please contact night-coverage [email or web address]

## 2021-11-23 NOTE — Care Management Important Message (Signed)
Important Message  Patient Details  Name: Michael [name] MRN: [id number] Date of Birth: 06-25-1933   Medicare Important Message Given:  Other (see comment)  Attempted to reach Sable Feil, daughter, at [phone], to review Medicare IM.  Left voicemail encouraging callback.   Dannette Barbara 11/23/2021, 11:17 AM

## 2021-11-23 NOTE — Progress Notes (Addendum)
Patient alert and oriented x4. Did give him a does of tussinex last night because he was awakened from his sleep by coughing. Afterwards, patient was able to get some rest. Blood glucose was 135 which did not require any regular insulin. He denied pain and additional needs.

## 2021-11-23 NOTE — Inpatient Diabetes Management (Signed)
Inpatient Diabetes Program Recommendations  AACE/ADA: New Consensus Statement on Inpatient Glycemic Control   Target Ranges:  Prepandial:   less than 140 mg/dL      Peak postprandial:   less than 180 mg/dL (1-2 hours)      Critically ill patients:  140 - 180 mg/dL    Latest Reference Range & Units 11/22/21 08:14 11/22/21 11:23 11/22/21 13:32 11/22/21 16:37 11/22/21 22:17 11/23/21 08:31  Glucose-Capillary 70 - 99 mg/dL 365 (H) 462 (H) 346 (H) 246 (H) 135 (H) 364 (H)   Review of Glycemic Control  Diabetes history: DM2 Outpatient Diabetes medications: Amaryl 2 mg QAM, Metformin 1000 mg BID, Actos 30 mg daily, Januvia 100 mg daily Current orders for Inpatient glycemic control: Semglee 10 units QHS, Novolog 0-20 units TID with meals, Novolog 0-5 units QHS; Prednisone 40 mg daily   Inpatient Diabetes Program Recommendations:     Insulin: If steroids are continued as ordered, please  consider increasing Semglee to 15 units QHS and ordering Novolog 4 units TID with meals for meal coverage if patient eats at least 50% of meals.  Thanks, Barnie Alderman, RN, MSN, [place] Diabetes Coordinator Inpatient Diabetes Program [phone] (Team Pager from 8am to [place])

## 2021-11-23 NOTE — TOC Transition Note (Signed)
Transition of Care [facility]) - CM/SW Discharge Note   Patient Details  Name: Michael [name] MRN: [id number] Date of Birth: 05/27/1933  [facility] CM/SW Contact:  Beverly Sessions, RN Phone Number: 11/23/2021, 1:07 PM   Clinical Narrative:      Patient to discharge today  Tommi Rumps with [employer] and Pam with [employer] notified of discharge  Pam to meet daughter at 2pm for infusion education.  Family to transport at discharge. Bed side RN update        Patient Goals and CMS Choice        Discharge Placement                       Discharge Plan and Services                                     Social Determinants of Health (SDOH) Interventions     Readmission Risk Interventions     No data to display

## 2021-11-23 NOTE — TOC Progression Note (Signed)
Transition of Care [facility]) - Progression Note    Patient Details  Name: Michael [name] MRN: [id number] Date of Birth: Nov 18, 1933  Transition of Care [facility]) CM/SW Contact  Beverly Sessions, RN Phone Number: 11/23/2021, 10:39 AM  Clinical Narrative:     Anticipated discharge today Tommi Rumps with [facility] and Pam with [employer] updated.   Pam to meet daughter at hospital for education       Expected Discharge Plan and Services                                                 Social Determinants of Health (SDOH) Interventions    Readmission Risk Interventions     No data to display

## 2021-11-24 ENCOUNTER — Other Ambulatory Visit (HOSPITAL_COMMUNITY): Payer: Self-pay

## 2021-11-24 ENCOUNTER — Telehealth: Payer: Self-pay | Admitting: Internal Medicine

## 2021-11-24 DIAGNOSIS — A419 Sepsis, unspecified organism: Secondary | ICD-10-CM | POA: Diagnosis not present

## 2021-11-24 NOTE — Telephone Encounter (Signed)
Per Chat: Hey! Can we please schedule patient for possible IVF with appts on 10/6. He was just discharged from the hospital this afternoon. Thanks!  Appointment scheduled- Thank you

## 2021-11-25 ENCOUNTER — Inpatient Hospital Stay: Payer: Medicare HMO

## 2021-11-25 ENCOUNTER — Encounter: Payer: Self-pay | Admitting: Internal Medicine

## 2021-11-25 ENCOUNTER — Inpatient Hospital Stay: Payer: Medicare HMO | Attending: Internal Medicine

## 2021-11-25 ENCOUNTER — Inpatient Hospital Stay (HOSPITAL_BASED_OUTPATIENT_CLINIC_OR_DEPARTMENT_OTHER): Payer: Medicare HMO | Admitting: Internal Medicine

## 2021-11-25 ENCOUNTER — Other Ambulatory Visit: Payer: Self-pay | Admitting: *Deleted

## 2021-11-25 ENCOUNTER — Ambulatory Visit: Payer: Medicare HMO | Admitting: Urology

## 2021-11-25 DIAGNOSIS — C61 Malignant neoplasm of prostate: Secondary | ICD-10-CM

## 2021-11-25 LAB — COMPREHENSIVE METABOLIC PANEL
ALT: 48 U/L — ABNORMAL HIGH (ref 0–44)
AST: 52 U/L — ABNORMAL HIGH (ref 15–41)
Albumin: 2.9 g/dL — ABNORMAL LOW (ref 3.5–5.0)
Alkaline Phosphatase: 66 U/L (ref 38–126)
Anion gap: 6 (ref 5–15)
BUN: 28 mg/dL — ABNORMAL HIGH (ref 8–23)
CO2: 21 mmol/L — ABNORMAL LOW (ref 22–32)
Calcium: 8.1 mg/dL — ABNORMAL LOW (ref 8.9–10.3)
Chloride: 106 mmol/L (ref 98–111)
Creatinine, Ser: 0.85 mg/dL (ref 0.61–1.24)
GFR, Estimated: >60 mL/min (ref >60)
Glucose, Bld: 234 mg/dL — ABNORMAL HIGH (ref 70–99)
Potassium: 3.9 mmol/L (ref 3.5–5.1)
Sodium: 133 mmol/L — ABNORMAL LOW (ref 135–145)
Total Bilirubin: 0.5 mg/dL (ref 0.3–1.2)
Total Protein: 6.5 g/dL (ref 6.5–8.1)

## 2021-11-25 LAB — CBC WITH DIFFERENTIAL/PLATELET
Abs Immature Granulocytes: 0.95 10*3/uL — ABNORMAL HIGH (ref 0.00–0.07)
Basophils Absolute: 0.1 10*3/uL (ref 0.0–0.1)
Basophils Relative: 1 %
Eosinophils Absolute: 0 10*3/uL (ref 0.0–0.5)
Eosinophils Relative: 1 %
HCT: 40.7 % (ref 39.0–52.0)
Hemoglobin: 13.9 g/dL (ref 13.0–17.0)
Immature Granulocytes: 12 %
Lymphocytes Relative: 8 %
Lymphs Abs: 0.6 10*3/uL — ABNORMAL LOW (ref 0.7–4.0)
MCH: 30.2 pg (ref 26.0–34.0)
MCHC: 34.2 g/dL (ref 30.0–36.0)
MCV: 88.5 fL (ref 80.0–100.0)
Monocytes Absolute: 0.7 10*3/uL (ref 0.1–1.0)
Monocytes Relative: 8 %
Neutro Abs: 5.6 10*3/uL (ref 1.7–7.7)
Neutrophils Relative %: 70 %
Platelets: 191 10*3/uL (ref 150–400)
RBC: 4.6 MIL/uL (ref 4.22–5.81)
RDW: 14.3 % (ref 11.5–15.5)
Smear Review: NORMAL
WBC: 7.9 10*3/uL (ref 4.0–10.5)
nRBC: 0 % (ref 0.0–0.2)

## 2021-11-25 LAB — LACTIC ACID, PLASMA: Lactic Acid, Venous: 1.9 mmol/L (ref 0.5–1.9)

## 2021-11-25 LAB — PSA: Prostatic Specific Antigen: 0.39 ng/mL (ref 0.00–4.00)

## 2021-11-25 NOTE — Progress Notes (Signed)
[place] OFFICE PROGRESS NOTE  Patient Care Team: [name], MD as PCP - General (Internal Medicine) Wellington [name], MD as PCP - Cardiology (Cardiology) [name], MD as Consulting Physician (Oncology)   Cancer Staging  No matching staging information was found for the patient.   Oncology History Overview Note  # FEB 2019-Metastatic prostate cancer/ castrate sensitive [bulky retroperitoneal adenopathy; invasion into the bladder; rectum] Bladder Bx- prostate. On Degarelix  # April 3rd 2019- [name] [124md];  Lupron q 6 M [[id number]]; MID AUG 2019- reduced dose to 881mday  #February 2023 bone scan PET scan progression of disease -bone metastases/ metastses-on RT to left hip/ lumbar area.-Complicated by multiple enteritis.  #May 2023 -right arm pain-secondary to tumor compression-MRI spine C7-T10 -status post evaluation with Dr. [name] Hypofraction[facility] of 30 Gray in [facility]   # Poorly controlled DM-on insulin  # s/p right PCN [explanted]/ Foley cath [Dr.Brandon]; multiple UTIs. [s/p ID; Dr.Ravishankar]  ----------------------------------------------------------------- DIAGNOSIS: [ FEB 2019] MET. PROSTATE CA  STAGE:   IV ;GOALS: PALLIATIVE  CURRENT/MOST RECENT THERAPY [ Feb-April2019] Lupron+ [name]    [facility] Chemotherapy   Patient is on Treatment Plan : Prostate cancer- Docetaxel q weekly     09/30/2021 -  Chemotherapy   Patient is on Treatment Plan : prostate cancer Docetaxel (25) q7d         INTERVAL HISTORY: Ambulating in a wheel chair.  Accompanied by his daughter.  Michael COWGER65.o.  male pleasant patient above history of castrate resistant metastatic prostate cancer currently on Xtandi/ELigard is here for follow-up.  In the interim patient was admitted to the hospital for sepsis with ESBL  E. Coli-states post antibiotics.  Patient noted to have elevated lactic acid even at discharge.  Patient currently has a PICC line in place.  He is getting ertapenem daily.   Patient continues to complain of generalized weakness.  Poor appetite.  Complains of pain in his buttocks.  Patient currently has a Foley catheter in place.  Review of Systems  Constitutional:  Positive for malaise/fatigue. Negative for chills, diaphoresis, fever and weight loss.  HENT:  Negative for nosebleeds and sore throat.   Eyes:  Negative for double vision.  Respiratory:  Negative for cough, hemoptysis, sputum production and wheezing.   Cardiovascular:  Negative for chest pain, palpitations, orthopnea and leg swelling.  Gastrointestinal:  Negative for abdominal pain, blood in stool, constipation, diarrhea, heartburn, melena, nausea and vomiting.  Musculoskeletal:  Positive for back pain and joint pain.  Skin: Negative.  Negative for itching and rash.  Neurological:  Negative for dizziness, tingling, focal weakness, weakness and headaches.  Endo/Heme/Allergies:  Does not bruise/bleed easily.  Psychiatric/Behavioral:  Negative for depression. The patient is not nervous/anxious and does not have insomnia.     PAST MEDICAL HISTORY :  Past Medical History:  Diagnosis Date   Anemia    iron deficiency   Aortic atherosclerosis (HCC)    Bilateral carotid artery disease (HCC)    Mild plaque formation without obstructive disease noted on carotid Doppler.   Diabetes mellitus without complication ([facility]   Essential hypertension    GERD (gastroesophageal reflux disease)    Hyperlipidemia    Hypertension    Left carotid artery stenosis    Nonobstructive Coronary artery disease    a. [facility]. The patient was  told about 2 blockages which did not require revascularization; b. 01/2015 Cath: LM nl, LAD 10p/m, 32md, D2 30ost, LCX nl, RCA min irregs, EF 55-65%; b. 12/2018 MV: No  ischemia/infarct. CT images w/ mod 3 vessel Cor Ca2+ and mild-mod Ao atherosclerosis.   Prostate cancer ([facility] 03/2017   Raynaud disease    a. Managed w/ nifedipine.    PAST SURGICAL HISTORY :   Past Surgical History:  Procedure Laterality Date   CARDIAC CATHETERIZATION  2010   CARDIAC CATHETERIZATION N/A 02/03/2015   Procedure: Left Heart Cath and Coronary Angiography;  Surgeon: MWellington [name] MD;  [facility];  Service: Cardiovascular;  Laterality: N/A;   CATARACT EXTRACTION Bilateral    CYSTOSCOPY W/ RETROGRADES Bilateral 04/11/2017   Procedure: CYSTOSCOPY WITH RETROGRADE PYELOGRAM;  Surgeon: BHollice Espy MD;  Location: [facility];  Service: Urology;  Laterality: Bilateral;   CYSTOSCOPY W/ RETROGRADES Bilateral 09/12/2017   Procedure: CYSTOSCOPY WITH RETROGRADE PYELOGRAM;  Surgeon: BHollice Espy MD;  Location: [facility];  Service: Urology;  Laterality: Bilateral;   CYSTOSCOPY W/ RETROGRADES Bilateral 02/25/2018   Procedure: CYSTOSCOPY WITH RETROGRADE PYELOGRAM;  Surgeon: BHollice Espy MD;  Location: [facility];  Service: Urology;  Laterality: Bilateral;   CYSTOSCOPY W/ URETERAL STENT PLACEMENT Bilateral 09/12/2017   Procedure: CYSTOSCOPY WITH STENT REPLACEMENT;  Surgeon: BHollice Espy MD;  Location: [facility];  Service: Urology;  Laterality: Bilateral;   CYSTOSCOPY W/ URETERAL STENT REMOVAL Bilateral 02/25/2018   Procedure: CYSTOSCOPY WITH STENT REMOVAL;  Surgeon: BHollice Espy MD;  Location: [facility];  Service: Urology;  Laterality: Bilateral;   CYSTOSCOPY WITH STENT PLACEMENT Left 04/11/2017   Procedure: CYSTOSCOPY WITH STENT PLACEMENT and fulgeration;  Surgeon: BHollice Espy MD;  Location: [facility];  Service: Urology;  Laterality: Left;   CYSTOSCOPY WITH URETHRAL DILATATION Bilateral 04/11/2017   Procedure: CYSTOSCOPY WITH URETHRAL DILATATION;  Surgeon: BHollice Espy MD;  Location: [facility];  Service: Urology;  Laterality: Bilateral;   IR CONVERT RIGHT  NEPHROSTOMY TO NEPHROURETERAL CATH  05/21/2017   IR NEPHROSTOMY PLACEMENT RIGHT  04/13/2017    FAMILY HISTORY :   Family History  Problem Relation Age of Onset   Hypertension Father     SOCIAL HISTORY:   Social History   Tobacco Use   Smoking status: Never   Smokeless tobacco: Never  Vaping Use   Vaping Use: Never used  Substance Use Topics   Alcohol use: No   Drug use: No    ALLERGIES:  has No Known Allergies.  MEDICATIONS:  Current Outpatient Medications  Medication Sig Dispense Refill   acetaminophen (TYLENOL) 500 MG tablet Take 500 mg by mouth every 4 (four) hours as needed for moderate pain or fever.     atorvastatin (LIPITOR) 10 MG tablet Take 10 mg by mouth daily at 6 PM.      Calcium Carb-Cholecalciferol (CALCIUM 600+D3) 600-800 MG-UNIT TABS Take 1 tablet by mouth every evening.      COMBIVENT RESPIMAT 20-100 MCG/ACT AERS respimat Inhale 1 puff into the lungs 4 (four) times daily.     enzalutamide (XTANDI) 40 MG tablet Take 2 tablets (80 mg total) by mouth daily. 60 tablet 3   ertapenem (INVANZ) IVPB Inject 1 g into the vein daily for 7 days. Indication:  ESBL E.coli bacteremia and complicated UTI First Dose: No Last Day of Therapy:  11/29/21 Labs - Once weekly:  CBC/D and CMP (Fax weekly lab results promptly to [phone] Call [phone] any questions Method of administration: Mini-Bag Plus /  Gravity Method of administration may be changed at the discretion of home infusion pharmacist based upon assessment of the patient and/or caregiver's ability to self-administer the medication ordered. 7 Units 0   glimepiride (AMARYL) 2 MG tablet Take 2 mg by mouth daily with breakfast.     glucose blood (ACCU-CHEK GUIDE) test strip Use asdirected three times a day diag E11.65 100 each 3   ibuprofen (ADVIL,MOTRIN) 200 MG tablet Take 200 mg by mouth every 6 (six) hours as needed for fever or moderate pain.      iron polysaccharides (NIFEREX) 150 MG capsule Take 150 mg  by mouth daily.      lisinopril (ZESTRIL) 2.5 MG tablet Take 2.5 mg by mouth daily.     metFORMIN (GLUCOPHAGE) 1000 MG tablet Take 1,000 mg by mouth 2 (two) times daily.     metoprolol succinate (TOPROL-XL) 25 MG 24 hr tablet TAKE 1 TABLET BY MOUTH EVERY DAY FOR BLOOD PRESSURE 90 tablet 3   Multiple Vitamins-Minerals (MULTIVITAMIN WITH MINERALS) tablet Take 1 tablet by mouth daily.      MYRBETRIQ 25 MG TB24 tablet TAKE 1 TABLET (25 MG TOTAL) BY MOUTH DAILY. 90 tablet 3   NIFEdipine (ADALAT CC) 30 MG 24 hr tablet TAKE 1 TABLET BY MOUTH EVERY DAY 90 tablet 2   omeprazole (PRILOSEC) 20 MG capsule TAKE ONE CAPSULE BY MOUTH EVERY DAY 90 capsule 2   pioglitazone (ACTOS) 30 MG tablet Take 30 mg by mouth daily.     potassium chloride (KLOR-CON M) 10 MEQ tablet Take 1 tablet (10 mEq total) by mouth daily for 3 days. 3 tablet 0   sitaGLIPtin (JANUVIA) 100 MG tablet Take 100 mg by mouth daily.     sucralfate (CARAFATE) 1 g tablet TAKE 1 TABLET (1 G TOTAL) BY MOUTH 4 TIMES A DAY WITH MEALS AND AT BEDTIME 540 tablet 1   traMADol (ULTRAM) 50 MG tablet Take 50 mg by mouth every 8 (eight) hours as needed for moderate pain or severe pain.     albuterol (PROVENTIL HFA;VENTOLIN HFA) 108 (90 Base) MCG/ACT inhaler Inhale 2 puffs into the lungs every 4 (four) hours as needed for wheezing or shortness of breath. (Patient not taking: Reported on 11/19/2021)     oxyCODONE (OXY IR/ROXICODONE) 5 MG immediate release tablet Take 1 tablet (5 mg total) by mouth every 4 (four) hours as needed for severe pain. (Patient not taking: Reported on 11/19/2021) 45 tablet 0   No current facility-administered medications for this visit.    PHYSICAL EXAMINATION: ECOG PERFORMANCE STATUS: 1 - Symptomatic but completely ambulatory  BP 121/79 (BP Location: Left Arm, Patient Position: Sitting)   Pulse 95   Temp (!) 97.4 F (36.3 C) (Tympanic)   Resp 16   Wt 124 lb 3.2 oz (56.3 kg)   SpO2 98%   BMI 20.05 kg/m   Filed Weights    11/25/21 1400  Weight: 124 lb 3.2 oz (56.3 kg)   Patient appears frail.    Physical Exam HENT:     Head: Normocephalic and atraumatic.     Mouth/Throat:     Pharynx: No oropharyngeal exudate.  Eyes:     Pupils: Pupils are equal, round, and reactive to light.  Cardiovascular:     Rate and Rhythm: Normal rate and regular rhythm.  Pulmonary:     Effort: Pulmonary effort is normal. No respiratory distress.     Breath sounds: Normal breath sounds. No wheezing.  Abdominal:     General: Bowel sounds  are normal. There is no distension.     Palpations: Abdomen is soft. There is no mass.     Tenderness: There is no abdominal tenderness. There is no guarding or rebound.  Musculoskeletal:        General: No tenderness. Normal range of motion.     Cervical back: Normal range of motion and neck supple.  Skin:    General: Skin is warm.  Neurological:     Mental Status: He is alert and oriented to person, place, and time.  Psychiatric:        Mood and Affect: Affect normal.      LABORATORY DATA:  I have reviewed the data as listed    Component Value Date/Time   NA 133 (L) 11/25/2021 1412   NA 136 05/07/2012 1328   K 3.9 11/25/2021 1412   K 4.5 05/07/2012 1328   CL 106 11/25/2021 1412   CL 104 05/07/2012 1328   CO2 21 (L) 11/25/2021 1412   CO2 28 05/07/2012 1328   GLUCOSE 234 (H) 11/25/2021 1412   GLUCOSE 73 05/07/2012 1328   BUN 28 (H) 11/25/2021 1412   BUN 16 05/07/2012 1328   CREATININE 0.85 11/25/2021 1412   CREATININE 0.84 05/07/2012 1328   CALCIUM 8.1 (L) 11/25/2021 1412   CALCIUM 8.8 05/07/2012 1328   PROT 6.5 11/25/2021 1412   ALBUMIN 2.9 (L) 11/25/2021 1412   AST 52 (H) 11/25/2021 1412   ALT 48 (H) 11/25/2021 1412   ALKPHOS 66 11/25/2021 1412   BILITOT 0.5 11/25/2021 1412   GFRNONAA >60 11/25/2021 1412   GFRNONAA >60 05/07/2012 1328   GFRAA >60 11/17/2019 0819   GFRAA >60 05/07/2012 1328    No results found for: "SPEP", "UPEP"  Lab Results  Component  Value Date   WBC 7.9 11/25/2021   NEUTROABS 5.6 11/25/2021   HGB 13.9 11/25/2021   HCT 40.7 11/25/2021   MCV 88.5 11/25/2021   PLT 191 11/25/2021      Chemistry      Component Value Date/Time   NA 133 (L) 11/25/2021 1412   NA 136 05/07/2012 1328   K 3.9 11/25/2021 1412   K 4.5 05/07/2012 1328   CL 106 11/25/2021 1412   CL 104 05/07/2012 1328   CO2 21 (L) 11/25/2021 1412   CO2 28 05/07/2012 1328   BUN 28 (H) 11/25/2021 1412   BUN 16 05/07/2012 1328   CREATININE 0.85 11/25/2021 1412   CREATININE 0.84 05/07/2012 1328      Component Value Date/Time   CALCIUM 8.1 (L) 11/25/2021 1412   CALCIUM 8.8 05/07/2012 1328   ALKPHOS 66 11/25/2021 1412   AST 52 (H) 11/25/2021 1412   ALT 48 (H) 11/25/2021 1412   BILITOT 0.5 11/25/2021 1412       RADIOGRAPHIC STUDIES: I have personally reviewed the radiological images as listed and agreed with the findings in the report. No results found.   ASSESSMENT & PLAN:  Primary prostate cancer with metastasis from prostate to other site [facility]) # Metastatic prostate cancer/[FEB 2019] castrate RESISTANT; PET scan FEB 2023-  Large lesion in the inferior LEFT iliac bone and L5 vertebral body.  Other areas include proximal femur -T2 vertebral body.  Multiple other bony areas-involved however no activity.  Positive metastatic lymph node in the LEFT pelvis; but no distant nodal metastasis or visceral metastasis; MAY 2023-MRI cervical spine/T-spine epidural tumor causing infiltration  C7-T4  of causing right arm pain-MRI spine.   #Currently s/p cervical spine radiation-improvement of  pain; PSA-trending down.  AP-CT scan OCT  2023 no obvious visible disease or progression of disease noted-except nodular lesion 1.5 cm noted ?  Anterior to the bladder-increasing in size to 1.9 cm.  Question metastatic disease.  Consider IR guided biopsy once acute issues resolve.  Will discuss with daughter.  #Continue Xtandi plus ADT.  Hold off starting any chemotherapy at  this time given patient's stable clinical status/comorbidities including poorly controlled diabetes etc/recent ESBL sepsis.   # OCT 2023-ESBL E. Coli/sepsis-status post meropenem.  Elevated lactic acid on admission- ?  Secondary to infection/malignancy today lactic acid is normal at 1.9.  Patient currently on ertapenem.   #Urinary obstruction-currently status post Foley catheter.  Continue noted.  Awaiting evaluation with urology next week.   #Right arm pain-secondary to tumor compression-MRI spine C7-T10 -status post   Hypofractionated dose of 30 Gray in 15 fractions.-Significant improvement of pain noted. STABLE.   # Bone metastases/ metastses-currently s/p  radiation to left hip/ lumbar area MARCH 1117 [s/p RT-complicated by small bowel enteritis].  Zometa every 3 months.  # Anemia-iron deficiency-hemoglobin ~11-12-  Hb 13- STABLE.   # DM-type II- on metformin-STABLE [Dr.Solum]. -need to monitor closely on chemotherapy/steroids-246.  Recommend monitoring blood sugars closely following with PCP.  #IV access: Discussed regarding port; PICC line in place- Left   * eliagard- next AUG mid 2023.   # DISPOSITION:  # follow up in 6 week- MD; labs- cbc/cmp;PSA - Dr.B            Orders Placed This Encounter  Procedures   CBC with Differential/Platelet    Standing Status:   Future    Standing Expiration Date:   11/25/2022   Comprehensive metabolic panel    Standing Status:   Future    Standing Expiration Date:   11/25/2022   PSA    Standing Status:   Future    Standing Expiration Date:   11/26/2022    All questions were answered. The patient knows to call the clinic with any problems, questions or concerns.      [name], MD 11/25/2021 4:31 PM

## 2021-11-25 NOTE — Assessment & Plan Note (Addendum)
#   Metastatic prostate cancer/[FEB 2019] castrate RESISTANT; PET scan FEB 2023-  Large lesion in the inferior LEFT iliac bone and L5 vertebral body.  Other areas include proximal femur -T2 vertebral body.  Multiple other bony areas-involved however no activity.  Positive metastatic lymph node in the LEFT pelvis; but no distant nodal metastasis or visceral metastasis; MAY 2023-MRI cervical spine/T-spine epidural tumor causing infiltration  C7-T4  of causing right arm pain-MRI spine.   #Currently s/p cervical spine radiation-improvement of pain; PSA-trending down.  AP-CT scan OCT  2023 no obvious visible disease or progression of disease noted-except nodular lesion 1.5 cm noted ?  Anterior to the bladder-increasing in size to 1.9 cm.  Question metastatic disease.  Consider IR guided biopsy once acute issues resolve.  Will discuss with daughter.  #Continue Xtandi plus ADT.  Hold off starting any chemotherapy at this time given patient's stable clinical status/comorbidities including poorly controlled diabetes etc/recent ESBL sepsis.   # OCT 2023-ESBL E. Coli/sepsis-status post meropenem.  Elevated lactic acid on admission- ?  Secondary to infection/malignancy today lactic acid is normal at 1.9.  Patient currently on ertapenem.   #Urinary obstruction-currently status post Foley catheter.  Continue noted.  Awaiting evaluation with urology next week.   #Right arm pain-secondary to tumor compression-MRI spine C7-T10 -status post   Hypofractionated dose of 30 Gray in 15 fractions.-Significant improvement of pain noted. STABLE.   # Bone metastases/ metastses-currently s/p  radiation to left hip/ lumbar area MARCH 8891 [s/p RT-complicated by small bowel enteritis].  Zometa every 3 months.  # Anemia-iron deficiency-hemoglobin ~11-12-  Hb 13- STABLE.   # DM-type II- on metformin-STABLE [Dr.Solum]. -need to monitor closely on chemotherapy/steroids-246.  Recommend monitoring blood sugars closely following with  PCP.  #IV access: Discussed regarding port; PICC line in place- Left   * eliagard- next AUG mid 2023.   # DISPOSITION:  # follow up in 6 week- MD; labs- cbc/cmp;PSA - Dr.B

## 2021-11-25 NOTE — Progress Notes (Signed)
Patient recently discharged from hospital and still not feeling well.  Has a painful area in middle of buttocks and very difficulty to sit.  No appetite with change in taste.

## 2021-11-26 LAB — CULTURE, BLOOD (ROUTINE X 2)
Culture: NO GROWTH
Culture: NO GROWTH
Special Requests: ADEQUATE
Special Requests: ADEQUATE

## 2021-11-28 ENCOUNTER — Other Ambulatory Visit (HOSPITAL_COMMUNITY): Payer: Self-pay

## 2021-11-29 ENCOUNTER — Encounter: Payer: Self-pay | Admitting: Cardiovascular Disease

## 2021-11-29 ENCOUNTER — Ambulatory Visit: Payer: Medicare HMO | Attending: Cardiovascular Disease | Admitting: Cardiovascular Disease

## 2021-11-29 VITALS — BP 116/75 | HR 101 | Ht 66.0 in | Wt 122.0 lb

## 2021-11-29 DIAGNOSIS — E785 Hyperlipidemia, unspecified: Secondary | ICD-10-CM

## 2021-11-29 DIAGNOSIS — I2510 Atherosclerotic heart disease of native coronary artery without angina pectoris: Secondary | ICD-10-CM

## 2021-11-29 DIAGNOSIS — I10 Essential (primary) hypertension: Secondary | ICD-10-CM | POA: Diagnosis not present

## 2021-11-29 NOTE — Progress Notes (Signed)
Cardiology Office Note   Date:  11/29/2021   ID:  Michael [name], DOB 1933-07-04, MRN [id number]  PCP:  [name], MD  Cardiologist:   [name], MD   Chief Complaint  Patient presents with   Other    Hosp. Syncope/12 Month f/u c/o Low BP, elevated HR and fatigue/weakness. Meds reviewed verbally with pt.      History of Present Illness: Michael [name] is a 88 y.o. male who presents for a follow-up visit regarding moderate calcified nonobstructive one-vessel coronary artery disease.  He has chronic medical conditions that include type 2 diabetes, hypertension, mild carotid disease and  hyperlipidemia.  He was seen in 2016 for abnormal stress test.  Cardiac catheterization was done in December 2016 which showed 40-50% heavily calcified stenosis in the mid to distal LAD. Ejection fraction was normal and left ventricular end-diastolic pressure was mildly elevated. The patient has been treated with atorvastatin. The patient was diagnosed with prostate cancer in early 2019.    He has advanced metastatic prostate cancer status postradiation for spinal cord compression and has bladder mass likely due to prostate cancer.  He was hospitalized recently at [facility] with syncope in the setting of coughing.  He had COVID back in August.  His blood pressure was 107/62 on presentation and EKG showed sinus tachycardia.  He also had gross hematuria and was found to have E. coli UTI.  He was treated with antibiotics.  He has been very weak since then and has not been able to do much walking.  In addition, he has poor oral intake due to poor appetite.  His blood pressure has been running low.  No chest pain or shortness of breath.   Past Medical History:  Diagnosis Date   Anemia    iron deficiency   Aortic atherosclerosis (HCC)    Bilateral carotid artery disease (HCC)    Mild plaque formation without obstructive disease noted on carotid Doppler.   Diabetes mellitus without  complication (Michael [name])    Essential hypertension    GERD (gastroesophageal reflux disease)    Hyperlipidemia    Hypertension    Left carotid artery stenosis    Nonobstructive Coronary artery disease    a. Previous cardiac catheterization at [facility] in 2010. The patient was told about 2 blockages which did not require revascularization; b. 01/2015 Cath: LM nl, LAD 10p/m, 23md, D2 30ost, LCX nl, RCA min irregs, EF 55-65%; b. 12/2018 MV: No ischemia/infarct. CT images w/ mod 3 vessel Cor Ca2+ and mild-mod Ao atherosclerosis.   Prostate cancer ([place] 03/2017   Raynaud disease    a. Managed w/ nifedipine.    Past Surgical History:  Procedure Laterality Date   CARDIAC CATHETERIZATION  2010   CARDIAC CATHETERIZATION N/A 02/03/2015   Procedure: Left Heart Cath and Coronary Angiography;  Surgeon: MWellington [name] MD;  Location: [facility];  Service: Cardiovascular;  Laterality: N/A;   CATARACT EXTRACTION Bilateral    CYSTOSCOPY W/ RETROGRADES Bilateral 04/11/2017   Procedure: CYSTOSCOPY WITH RETROGRADE PYELOGRAM;  Surgeon: BHollice Espy MD;  Location: [facility];  Service: Urology;  Laterality: Bilateral;   CYSTOSCOPY W/ RETROGRADES Bilateral 09/12/2017   Procedure: CYSTOSCOPY WITH RETROGRADE PYELOGRAM;  Surgeon: BHollice Espy MD;  Location: [facility];  Service: Urology;  Laterality: Bilateral;   CYSTOSCOPY W/ RETROGRADES Bilateral 02/25/2018   Procedure: CYSTOSCOPY WITH RETROGRADE PYELOGRAM;  Surgeon: BHollice Espy MD;  Location: [facility];  Service: Urology;  Laterality: Bilateral;   CYSTOSCOPY W/ URETERAL  STENT PLACEMENT Bilateral 09/12/2017   Procedure: CYSTOSCOPY WITH STENT REPLACEMENT;  Surgeon: [name], MD;  Location: [facility];  Service: Urology;  Laterality: Bilateral;   CYSTOSCOPY W/ URETERAL STENT REMOVAL Bilateral 02/25/2018   Procedure: CYSTOSCOPY WITH STENT REMOVAL;  Surgeon: [name], MD;  Location: [facility];  Service: Urology;  Laterality: Bilateral;   CYSTOSCOPY  WITH STENT PLACEMENT Left 04/11/2017   Procedure: CYSTOSCOPY WITH STENT PLACEMENT and fulgeration;  Surgeon: [name], MD;  Location: [facility];  Service: Urology;  Laterality: Left;   CYSTOSCOPY WITH URETHRAL DILATATION Bilateral 04/11/2017   Procedure: CYSTOSCOPY WITH URETHRAL DILATATION;  Surgeon: [name], MD;  Location: [facility];  Service: Urology;  Laterality: Bilateral;   IR CONVERT RIGHT NEPHROSTOMY TO NEPHROURETERAL CATH  05/21/2017   IR NEPHROSTOMY PLACEMENT RIGHT  04/13/2017     Current Outpatient Medications  Medication Sig Dispense Refill   acetaminophen (TYLENOL) 500 MG tablet Take 500 mg by mouth every 4 (four) hours as needed for moderate pain or fever.     atorvastatin (LIPITOR) 10 MG tablet Take 10 mg by mouth daily at 6 PM.      Calcium Carb-Cholecalciferol (CALCIUM 600+D3) 600-800 MG-UNIT TABS Take 1 tablet by mouth every evening.      COMBIVENT RESPIMAT 20-100 MCG/ACT AERS respimat Inhale 1 puff into the lungs 4 (four) times daily.     enzalutamide (XTANDI) 40 MG tablet Take 2 tablets (80 mg total) by mouth daily. 60 tablet 3   ertapenem (INVANZ) IVPB Inject 1 g into the vein daily for 7 days. Indication:  ESBL E.coli bacteremia and complicated UTI First Dose: No Last Day of Therapy:  11/29/21 Labs - Once weekly:  CBC/D and CMP (Fax weekly lab results promptly to [phone]) Call [phone] with any questions Method of administration: Mini-Bag Plus / Gravity Method of administration may be changed at the discretion of home infusion pharmacist based upon assessment of the patient and/or caregiver's ability to self-administer the medication ordered. 7 Units 0   glimepiride (AMARYL) 2 MG tablet Take 2 mg by mouth daily with breakfast.     glucose blood (ACCU-CHEK GUIDE) test strip Use asdirected three times a day diag E11.65 100 each 3   ibuprofen (ADVIL,MOTRIN) 200 MG tablet Take 200 mg by mouth every 6 (six) hours as needed for fever or moderate pain.       iron polysaccharides (NIFEREX) 150 MG capsule Take 150 mg by mouth daily.      lisinopril (ZESTRIL) 2.5 MG tablet Take 2.5 mg by mouth daily.     metFORMIN (GLUCOPHAGE) 1000 MG tablet Take 1,000 mg by mouth 2 (two) times daily.     metoprolol succinate (TOPROL-XL) 25 MG 24 hr tablet TAKE 1 TABLET BY MOUTH EVERY DAY FOR BLOOD PRESSURE 90 tablet 3   Multiple Vitamins-Minerals (MULTIVITAMIN WITH MINERALS) tablet Take 1 tablet by mouth daily.      MYRBETRIQ 25 MG TB24 tablet TAKE 1 TABLET (25 MG TOTAL) BY MOUTH DAILY. 90 tablet 3   NIFEdipine (ADALAT CC) 30 MG 24 hr tablet TAKE 1 TABLET BY MOUTH EVERY DAY 90 tablet 2   omeprazole (PRILOSEC) 20 MG capsule TAKE ONE CAPSULE BY MOUTH EVERY DAY 90 capsule 2   oxyCODONE (OXY IR/ROXICODONE) 5 MG immediate release tablet Take 1 tablet (5 mg total) by mouth every 4 (four) hours as needed for severe pain. 45 tablet 0   pioglitazone (ACTOS) 30 MG tablet Take 30 mg by mouth daily.     traMADol (  ULTRAM) 50 MG tablet Take 50 mg by mouth every 8 (eight) hours as needed for moderate pain or severe pain.     No current facility-administered medications for this visit.    Allergies:   Patient has no known allergies.    Social History:  The patient  reports that he has never smoked. He has never used smokeless tobacco. He reports that he does not drink alcohol and does not use drugs.   Family History:  The patient's family history includes Hypertension in his father.    ROS:  Please see the history of present illness.   Otherwise, review of systems are positive for none.   All other systems are reviewed and negative.    PHYSICAL EXAM: VS:  BP 116/75 (BP Location: Right Arm, Patient Position: Sitting, Cuff Size: Normal)   Pulse (!) 101   Ht '5\' 6"'$  (1.676 m)   Wt 122 lb (55.3 kg)   SpO2 99%   BMI 19.69 kg/m  , BMI Body mass index is 19.69 kg/m. GEN: Malnourished, in no acute distress  HEENT: normal  Neck: no JVD, carotid bruits, or masses Cardiac: RRR;  no murmurs, rubs, or gallops,no edema  Respiratory:  clear to auscultation bilaterally, normal work of breathing GI: soft, nontender, nondistended, + BS MS: no deformity or atrophy  Skin: warm and dry, no rash Neuro:  Strength and sensation are intact Psych: euthymic mood, full affect Radial pulses normal bilaterally.  EKG:  EKG is ordered today. The ekg ordered today demonstrates sinus tachycardia with nonspecific ST changes.   Recent Labs: 11/20/2021: B Natriuretic Peptide 374.9 11/21/2021: Magnesium 2.1 11/25/2021: ALT 48; BUN 28; Creatinine, Ser 0.85; Hemoglobin 13.9; Platelets 191; Potassium 3.9; Sodium 133    Lipid Panel No results found for: "CHOL", "TRIG", "HDL", "CHOLHDL", "VLDL", "LDLCALC", "LDLDIRECT"    Wt Readings from Last 3 Encounters:  11/29/21 122 lb (55.3 kg)  11/25/21 124 lb 3.2 oz (56.3 kg)  11/23/21 128 lb 4.9 oz (58.2 kg)           No data to display            ASSESSMENT AND PLAN:  1.  Coronary artery disease involving native coronary arteries without angina: He has no anginal symptoms.  2.  Hyperlipidemia: Continue treatment with atorvastatin with a target LDL of less than 70.    3.  Essential hypertension: His blood pressure has been running low likely due to poor oral intake.  Thus, I discontinued lisinopril and nifedipine.  Continue Toprol.  4.  Metastatic prostate cancer: Unfortunately, this seems to be progressive and the patient appears to be malnourished and deconditioned.  I am not entirely sure he will be a candidate for chemotherapy.  He has follow-up appointment with his primary care physician and oncology.    Disposition:   FU in 4 months.  Signed,  [name], MD  11/29/2021 2:43 PM    [place]

## 2021-11-29 NOTE — Patient Instructions (Signed)
Medication Instructions:  Your physician has recommended you make the following change in your medication:  STOP: Lisinopril and Nifedipine  *If you need a refill on your cardiac medications before your next appointment, please call your pharmacy*   Lab Work: NONE ordered at this time of appointment   If you have labs (blood work) drawn today and your tests are completely normal, you will receive your results only by: [place] (if you have MyChart) OR A paper copy in the mail If you have any lab test that is abnormal or we need to change your treatment, we will call you to review the results.   Testing/Procedures: NONE ordered at this time of appointment     Follow-Up: At [facility], you and your health needs are our priority.  As part of our continuing mission to provide you with exceptional heart care, we have created designated Provider Care Teams.  These Care Teams include your primary Cardiologist (physician) and Advanced Practice Providers (APPs -  Physician Assistants and Nurse Practitioners) who all work together to provide you with the care you need, when you need it.  We recommend signing up for the patient portal called "MyChart".  Sign up information is provided on this After Visit Summary.  MyChart is used to connect with patients for Virtual Visits (Telemedicine).  Patients are able to view lab/test results, encounter notes, upcoming appointments, etc.  Non-urgent messages can be sent to your provider as well.   To learn more about what you can do with MyChart, go to [email or web address].    Your next appointment:   4 month(s)  The format for your next appointment:   In Person  Provider:   You will see one of the following Advanced Practice Providers on your designated Care Team:   Murray Hodgkins, NP Christell Faith, PA-C Cadence Kathlen Mody, PA-C Gerrie Nordmann, NP   Important Information About Sugar

## 2021-11-30 ENCOUNTER — Telehealth (INDEPENDENT_AMBULATORY_CARE_PROVIDER_SITE_OTHER): Payer: Medicare HMO | Admitting: Urology

## 2021-11-30 DIAGNOSIS — R8271 Bacteriuria: Secondary | ICD-10-CM

## 2021-11-30 DIAGNOSIS — R339 Retention of urine, unspecified: Secondary | ICD-10-CM

## 2021-11-30 DIAGNOSIS — N3289 Other specified disorders of bladder: Secondary | ICD-10-CM | POA: Diagnosis not present

## 2021-11-30 NOTE — Progress Notes (Signed)
Virtual Visit via Video Note  I connected with Michael [name] on 11/30/21 at  2:15 PM EDT by a video enabled telemedicine application and verified that I am speaking with the correct person using two identifiers.  Location: Patient: home accompanied by daughter Provider: [place] office   I discussed the limitations of evaluation and management by telemedicine and the availability of in person appointments. The patient expressed understanding and agreed to proceed.  History of Present Illness: 88 year old male with ?castrate resistant metastatic prostate cancer , chronic urinary retention and history of bladder neck contracture who presents today for follow-up.  At last visit with me, he was noted to be retaining increasing volumes of urine.  Please see previous notes for details.  He presented to our office in order to learn CIC however this was not successful and ended up having some mild prostate trauma.  An hour later, he had a passing out at home and was taken to the emergency room.  He grew E. coli in his urine, this time not ESBL organism that he has previously grown.  He was treated with antibiotics as well as for COVID-pneumonia.  He did have a Foley catheter placed by Dr. Louis Meckel in the setting of retention and UTI.  At this point, he remains fairly deconditioned.  He was able to ambulate with PT OT in the hospital but since coming home, he seems to get weaker.  PT and OT will be coming tomorrow for home reevaluation.  Observations/Objective: Lying on the couch, accompanied by his daughter.  She provides much of the history today.  Assessment and Plan:  1. Chronic retention of urine We had a frank and honest conversation today about how to move forward.  This complex situation.  The very minimal bladder/prostatic certainly self cath based on inability to do so in the office.  We discussed the option of chronic indwelling Foley with catheter exchanges here in our office.   At this point, this seems like the most reasonable option given his worsening urinary frequency and significantly higher postvoid residual numbers.  We discussed the concern for sepsis with catheter exchanges.  Given his previous trauma, I will plan for the first exchange.  We will treat him periprocedurally with antibiotics before and after the exchange based on his most recent culture results in order to try to reduce the risk although we certainly cannot eliminate the risk of recurrent sepsis and/or bacteremia as we will be unable to completely eliminate his bacterial load.  Both the patient and his daughter understand this.  Prostate cancer is being managed by Dr. Rogue Bussing.  2. Chronic bacteriuria As above, will treat with Keflex for 5 days starting 3 days prior to his next catheter exchange as outlined above  3. Bladder mass Bladder lesion appears to be increasing in size.  We will discuss the option of cystoscopy down the road however given that he was not even able to tolerate attempted catheter placement, this is a fairly high risk procedure at this point in time and he is extremely frail.  He would likely not be a candidate for TURBT and his current functional status.  Plan to reassess at his follow-up.   Follow Up Instructions: F/u 2 weeks foley exchange   I discussed the assessment and treatment plan with the patient. The patient was provided an opportunity to ask questions and all were answered. The patient agreed with the plan and demonstrated an understanding of the instructions.   The patient was  advised to call back or seek an in-person evaluation if the symptoms worsen or if the condition fails to improve as anticipated.  I provided 17 minutes of non-face-to-face time during this encounter via video + 5 min chart review and documentation   Michael Espy, MD

## 2021-12-01 ENCOUNTER — Encounter: Payer: Self-pay | Admitting: Internal Medicine

## 2021-12-01 ENCOUNTER — Telehealth: Payer: Self-pay | Admitting: Internal Medicine

## 2021-12-01 ENCOUNTER — Other Ambulatory Visit: Payer: Self-pay | Admitting: Internal Medicine

## 2021-12-01 DIAGNOSIS — C61 Malignant neoplasm of prostate: Secondary | ICD-10-CM

## 2021-12-01 DIAGNOSIS — M545 Low back pain, unspecified: Secondary | ICD-10-CM

## 2021-12-01 DIAGNOSIS — M79604 Pain in right leg: Secondary | ICD-10-CM

## 2021-12-01 MED ORDER — CEPHALEXIN 500 MG PO CAPS: 500.0000 mg | ORAL_CAPSULE | Freq: Three times a day (TID) | ORAL | 0 refills | Status: DC

## 2021-12-01 NOTE — Telephone Encounter (Signed)
Late entry-I spoke with patient's daughter on 10/10-concerned about generalized weakness; drowsiness- ?  Related to ertapenem.  Discussed with Dr. Tana Coast likely a possibility but not ruled out.  On 10/11-spoke to patient's daughter-mental status slightly improved however-complains of weakness of the right lower extremity.  Discussed regarding urgent evaluation with MRI thoracic/lumbar spine.  Discussed options-outpatient versus ER evaluation.  Daughter agrees with ER evaluation.   GB  FYI-

## 2021-12-02 ENCOUNTER — Encounter: Payer: Self-pay | Admitting: Internal Medicine

## 2021-12-02 DIAGNOSIS — Z09 Encounter for follow-up examination after completed treatment for conditions other than malignant neoplasm: Secondary | ICD-10-CM | POA: Diagnosis not present

## 2021-12-02 DIAGNOSIS — I10 Essential (primary) hypertension: Secondary | ICD-10-CM | POA: Diagnosis not present

## 2021-12-02 DIAGNOSIS — C61 Malignant neoplasm of prostate: Secondary | ICD-10-CM | POA: Diagnosis not present

## 2021-12-02 DIAGNOSIS — Z9289 Personal history of other medical treatment: Secondary | ICD-10-CM | POA: Diagnosis not present

## 2021-12-02 DIAGNOSIS — E119 Type 2 diabetes mellitus without complications: Secondary | ICD-10-CM | POA: Diagnosis not present

## 2021-12-04 ENCOUNTER — Ambulatory Visit
Admission: RE | Admit: 2021-12-04 | Discharge: 2021-12-04 | Disposition: A | Discharge status: Home / Self Care | Payer: Medicare HMO | Source: Ambulatory Visit | Attending: Internal Medicine | Admitting: Internal Medicine

## 2021-12-04 DIAGNOSIS — M545 Low back pain, unspecified: Secondary | ICD-10-CM | POA: Insufficient documentation

## 2021-12-04 DIAGNOSIS — M79604 Pain in right leg: Secondary | ICD-10-CM | POA: Diagnosis not present

## 2021-12-04 DIAGNOSIS — C61 Malignant neoplasm of prostate: Secondary | ICD-10-CM

## 2021-12-04 DIAGNOSIS — M5126 Other intervertebral disc displacement, lumbar region: Secondary | ICD-10-CM | POA: Diagnosis not present

## 2021-12-04 DIAGNOSIS — M549 Dorsalgia, unspecified: Secondary | ICD-10-CM | POA: Diagnosis not present

## 2021-12-04 MED ORDER — GADOBUTROL 1 MMOL/ML IV SOLN
5.0000 mL | Freq: Once | INTRAVENOUS | Status: AC | PRN
  Administered 2021-12-04: 7.5 mL via INTRAVENOUS

## 2021-12-04 NOTE — Progress Notes (Signed)
I spoke to daughter regarding the results of the MRI lumbar and thoracic spine-concern for progressive vertebral/epidural disease at T12 and L5.  No evidence of cord compression.  Discussed regarding radiation for palliative reasons.  Discussed the option of chemotherapy-however patient performance status declining; recent UTI sepsis ESBL E. coli.  Daughter concerned about patient's tolerance to chemotherapy-which I think is a  very valid concern.  For now discussed-regarding radiation oncology evaluation.  Family also inquired regarding seeking an urology second opinion given patient's recent issues with catheterization/UTI etc. Will discuss with Dr. Donella Stade and Dr. Erlene Quan.  GB

## 2021-12-05 ENCOUNTER — Other Ambulatory Visit: Payer: Self-pay | Admitting: *Deleted

## 2021-12-05 DIAGNOSIS — C61 Malignant neoplasm of prostate: Secondary | ICD-10-CM

## 2021-12-06 ENCOUNTER — Institutional Professional Consult (permissible substitution): Payer: Medicare HMO | Admitting: Radiation Oncology

## 2021-12-08 ENCOUNTER — Ambulatory Visit
Admission: RE | Admit: 2021-12-08 | Discharge: 2021-12-08 | Disposition: A | Discharge status: Home / Self Care | Payer: Medicare HMO | Source: Ambulatory Visit | Attending: Radiation Oncology | Admitting: Radiation Oncology

## 2021-12-08 ENCOUNTER — Encounter: Payer: Self-pay | Admitting: Radiation Oncology

## 2021-12-08 VITALS — BP 123/76 | HR 106 | Temp 97.0°F | Resp 12 | Wt 121.0 lb

## 2021-12-08 DIAGNOSIS — C61 Malignant neoplasm of prostate: Secondary | ICD-10-CM | POA: Insufficient documentation

## 2021-12-08 DIAGNOSIS — Z510 Encounter for antineoplastic radiation therapy: Secondary | ICD-10-CM | POA: Diagnosis not present

## 2021-12-08 DIAGNOSIS — G893 Neoplasm related pain (acute) (chronic): Secondary | ICD-10-CM | POA: Diagnosis not present

## 2021-12-08 DIAGNOSIS — Z191 Hormone sensitive malignancy status: Secondary | ICD-10-CM | POA: Diagnosis not present

## 2021-12-08 DIAGNOSIS — C7951 Secondary malignant neoplasm of bone: Secondary | ICD-10-CM | POA: Insufficient documentation

## 2021-12-08 NOTE — Progress Notes (Signed)
Radiation Oncology Follow up Note old patient new area lower thoracic spine metastases  Name: Michael [name]   Date:   12/08/2021 MRN:  [id number] DOB: [birth year]    This 88 y.o. male presents to the clinic today for evaluation of lower thoracic upper lumbar spine involvement of.  Metastatic prostate cancer  REFERRING PROVIDER: [name], MD  HPI: Patient is a an 88 year old male well-known to our department having received palliative radiation therapy to his upper thoracic spine for involvement of metastatic prostate cancer.  He had a good result with alleviation of pain in that region.  Recent MRI scan shows no evidence of disease.  He is having lower back pain at this point time and MRI scan shows.  Complete replacement of T12 with metastatic disease as well as complete replacement of L5 vertebral body.  Patient is now referred for referred back to ration collagen for consideration of further palliative treatment.  He is having no focal neurologic deficits.  Continues to complain of low back pain.  COMPLICATIONS OF TREATMENT: none  FOLLOW UP COMPLIANCE: keeps appointments   PHYSICAL EXAM:  BP 123/76 (BP Location: Right Arm, Patient Position: Sitting, Cuff Size: Normal)   Pulse (!) 106   Temp (!) 97 F (36.1 C) (Tympanic)   Resp 12   Wt 121 lb (54.9 kg)   BMI 19.53 kg/m patient is a Foley catheter.  No focal neurologic deficits in the upper or lower extremities are noted. Well-developed well-nourished patient in NAD. HEENT reveals PERLA, EOMI, discs not visualized.  Oral cavity is clear. No oral mucosal lesions are identified. Neck is clear without evidence of cervical or supraclavicular adenopathy. Lungs are clear to A&P. Cardiac examination is essentially unremarkable with regular rate and rhythm without murmur rub or thrill. Abdomen is benign with no organomegaly or masses noted. Motor sensory and DTR levels are equal and symmetric in the upper and lower extremities.  Cranial nerves II through XII are grossly intact. Proprioception is intact. No peripheral adenopathy or edema is identified. No motor or sensory levels are noted. Crude visual fields are within normal range.  RADIOLOGY RESULTS: MRI scans of thoracic spine lumbar spine reviewed compatible with above-stated findings  PLAN: This time like to go ahead with palliative radiation therapy concentrating on T12 through an L5.  Risk and benefits of treatment occluding possible diarrhea GI upset skin reaction fatigue all reviewed again with the patient and his daughter.  They both comprehend my treatment plan well.  I have personally 7 ordered CT simulation for next week.  I would like to take this opportunity to thank you for allowing me to participate in the care of your patient.Noreene Filbert, MD

## 2021-12-13 ENCOUNTER — Ambulatory Visit
Admission: RE | Admit: 2021-12-13 | Discharge: 2021-12-13 | Disposition: A | Discharge status: Home / Self Care | Payer: Medicare HMO | Source: Ambulatory Visit | Attending: Radiation Oncology | Admitting: Radiation Oncology

## 2021-12-13 DIAGNOSIS — C61 Malignant neoplasm of prostate: Secondary | ICD-10-CM | POA: Diagnosis not present

## 2021-12-13 DIAGNOSIS — C7951 Secondary malignant neoplasm of bone: Secondary | ICD-10-CM | POA: Diagnosis not present

## 2021-12-13 DIAGNOSIS — Z191 Hormone sensitive malignancy status: Secondary | ICD-10-CM | POA: Diagnosis not present

## 2021-12-13 DIAGNOSIS — G893 Neoplasm related pain (acute) (chronic): Secondary | ICD-10-CM | POA: Diagnosis not present

## 2021-12-13 DIAGNOSIS — Z510 Encounter for antineoplastic radiation therapy: Secondary | ICD-10-CM | POA: Diagnosis not present

## 2021-12-14 DIAGNOSIS — Z191 Hormone sensitive malignancy status: Secondary | ICD-10-CM | POA: Diagnosis not present

## 2021-12-14 DIAGNOSIS — C7951 Secondary malignant neoplasm of bone: Secondary | ICD-10-CM | POA: Diagnosis not present

## 2021-12-14 DIAGNOSIS — C61 Malignant neoplasm of prostate: Secondary | ICD-10-CM | POA: Diagnosis not present

## 2021-12-14 DIAGNOSIS — Z510 Encounter for antineoplastic radiation therapy: Secondary | ICD-10-CM | POA: Diagnosis not present

## 2021-12-14 DIAGNOSIS — G893 Neoplasm related pain (acute) (chronic): Secondary | ICD-10-CM | POA: Diagnosis not present

## 2021-12-15 ENCOUNTER — Ambulatory Visit
Admission: RE | Admit: 2021-12-15 | Discharge: 2021-12-15 | Disposition: A | Discharge status: Home / Self Care | Payer: Medicare HMO | Source: Ambulatory Visit | Attending: Radiation Oncology | Admitting: Radiation Oncology

## 2021-12-15 ENCOUNTER — Ambulatory Visit (INDEPENDENT_AMBULATORY_CARE_PROVIDER_SITE_OTHER): Payer: Medicare HMO | Admitting: Urology

## 2021-12-15 ENCOUNTER — Encounter: Payer: Self-pay | Admitting: Urology

## 2021-12-15 ENCOUNTER — Other Ambulatory Visit: Payer: Self-pay

## 2021-12-15 ENCOUNTER — Other Ambulatory Visit (HOSPITAL_COMMUNITY): Payer: Self-pay

## 2021-12-15 VITALS — BP 142/83 | HR 82 | Ht 66.0 in

## 2021-12-15 DIAGNOSIS — R8271 Bacteriuria: Secondary | ICD-10-CM

## 2021-12-15 DIAGNOSIS — R339 Retention of urine, unspecified: Secondary | ICD-10-CM | POA: Diagnosis not present

## 2021-12-15 DIAGNOSIS — Z510 Encounter for antineoplastic radiation therapy: Secondary | ICD-10-CM | POA: Diagnosis not present

## 2021-12-15 DIAGNOSIS — C61 Malignant neoplasm of prostate: Secondary | ICD-10-CM | POA: Diagnosis not present

## 2021-12-15 DIAGNOSIS — G893 Neoplasm related pain (acute) (chronic): Secondary | ICD-10-CM | POA: Diagnosis not present

## 2021-12-15 DIAGNOSIS — C7951 Secondary malignant neoplasm of bone: Secondary | ICD-10-CM | POA: Diagnosis not present

## 2021-12-15 DIAGNOSIS — Z191 Hormone sensitive malignancy status: Secondary | ICD-10-CM | POA: Diagnosis not present

## 2021-12-15 LAB — RAD ONC ARIA SESSION SUMMARY
Course Elapsed Days: 0
Plan Fractions Treated to Date: 1
Plan Prescribed Dose Per Fraction: 3 Gy
Plan Total Fractions Prescribed: 10
Plan Total Prescribed Dose: 30 Gy
Reference Point Dosage Given to Date: 3 Gy
Reference Point Session Dosage Given: 3 Gy
Session Number: 1

## 2021-12-16 ENCOUNTER — Other Ambulatory Visit: Payer: Self-pay | Admitting: Internal Medicine

## 2021-12-16 ENCOUNTER — Other Ambulatory Visit: Payer: Self-pay

## 2021-12-16 ENCOUNTER — Ambulatory Visit
Admission: RE | Admit: 2021-12-16 | Discharge: 2021-12-16 | Disposition: A | Discharge status: Home / Self Care | Payer: Medicare HMO | Source: Ambulatory Visit | Attending: Radiation Oncology | Admitting: Radiation Oncology

## 2021-12-16 DIAGNOSIS — C7951 Secondary malignant neoplasm of bone: Secondary | ICD-10-CM | POA: Diagnosis not present

## 2021-12-16 DIAGNOSIS — G893 Neoplasm related pain (acute) (chronic): Secondary | ICD-10-CM | POA: Diagnosis not present

## 2021-12-16 DIAGNOSIS — Z510 Encounter for antineoplastic radiation therapy: Secondary | ICD-10-CM | POA: Diagnosis not present

## 2021-12-16 DIAGNOSIS — C61 Malignant neoplasm of prostate: Secondary | ICD-10-CM | POA: Diagnosis not present

## 2021-12-16 LAB — RAD ONC ARIA SESSION SUMMARY
Course Elapsed Days: 1
Plan Fractions Treated to Date: 2
Plan Prescribed Dose Per Fraction: 3 Gy
Plan Total Fractions Prescribed: 10
Plan Total Prescribed Dose: 30 Gy
Reference Point Dosage Given to Date: 6 Gy
Reference Point Session Dosage Given: 3 Gy
Session Number: 2

## 2021-12-16 NOTE — Progress Notes (Signed)
Discussed with the patient's daughter-pain improved; not resolved.  Currently undergoing radiation.  Patient will keep appointment as planned in November  GB

## 2021-12-18 NOTE — Progress Notes (Signed)
12/15/2021 12:22 PM   HILLEL CARD November 22, 1933 [id number]  Referring provider: [name], MD [address] [facility] [place],  [place] [address]  Chief Complaint  Patient presents with   Vascular Access Problem    HPI: Extremely complex 88 year old male with progressing castrate resistant prostate cancer now in frank urinary retention who presents today for catheter exchange.  He is undergoing palliative radiation again to the spine.  He presents today to the office just after completing this.  He was treated with periprocedural antibiotics today as he previously developed sepsis after attempted C[facility] teaching last month.   PMH: Past Medical History:  Diagnosis Date   Anemia    iron deficiency   Aortic atherosclerosis ([place])    Bilateral carotid artery disease (HCC)    Mild plaque formation without obstructive disease noted on carotid Doppler.   Diabetes mellitus without complication ([place])    Essential hypertension    GERD (gastroesophageal reflux disease)    Hyperlipidemia    Hypertension    Left carotid artery stenosis    Nonobstructive Coronary artery disease    a. Previous cardiac catheterization at [facility] in 2010. The patient was told about 2 blockages which did not require revascularization; b. 01/2015 Cath: LM nl, LAD 10p/m, 29md, D2 30ost, LCX nl, RCA min irregs, EF 55-65%; b. 12/2018 MV: No ischemia/infarct. CT images w/ mod 3 vessel Cor Ca2+ and mild-mod Ao atherosclerosis.   Prostate cancer ([facility] 03/2017   Raynaud disease    a. Managed w/ nifedipine.    Surgical History: Past Surgical History:  Procedure Laterality Date   CARDIAC CATHETERIZATION  2010   CARDIAC CATHETERIZATION N/A 02/03/2015   Procedure: Left Heart Cath and Coronary Angiography;  Surgeon: MWellington [name] MD;  [facility];  Service: Cardiovascular;  Laterality: N/A;   CATARACT EXTRACTION Bilateral    CYSTOSCOPY W/ RETROGRADES Bilateral  04/11/2017   Procedure: CYSTOSCOPY WITH RETROGRADE PYELOGRAM;  Surgeon: BHollice Espy MD;  Location: [facility];  Service: Urology;  Laterality: Bilateral;   CYSTOSCOPY W/ RETROGRADES Bilateral 09/12/2017   Procedure: CYSTOSCOPY WITH RETROGRADE PYELOGRAM;  Surgeon: BHollice Espy MD;  Location: [facility];  Service: Urology;  Laterality: Bilateral;   CYSTOSCOPY W/ RETROGRADES Bilateral 02/25/2018   Procedure: CYSTOSCOPY WITH RETROGRADE PYELOGRAM;  Surgeon: BHollice Espy MD;  Location: [facility];  Service: Urology;  Laterality: Bilateral;   CYSTOSCOPY W/ URETERAL STENT PLACEMENT Bilateral 09/12/2017   Procedure: CYSTOSCOPY WITH STENT REPLACEMENT;  Surgeon: BHollice Espy MD;  Location: [facility];  Service: Urology;  Laterality: Bilateral;   CYSTOSCOPY W/ URETERAL STENT REMOVAL Bilateral 02/25/2018   Procedure: CYSTOSCOPY WITH STENT REMOVAL;  Surgeon: BHollice Espy MD;  Location: [facility];  Service: Urology;  Laterality: Bilateral;   CYSTOSCOPY WITH STENT PLACEMENT Left 04/11/2017   Procedure: CYSTOSCOPY WITH STENT PLACEMENT and fulgeration;  Surgeon: BHollice Espy MD;  Location: [facility];  Service: Urology;  Laterality: Left;   CYSTOSCOPY WITH URETHRAL DILATATION Bilateral 04/11/2017   Procedure: CYSTOSCOPY WITH URETHRAL DILATATION;  Surgeon: BHollice Espy MD;  Location: [facility];  Service: Urology;  Laterality: Bilateral;   IR CONVERT RIGHT NEPHROSTOMY TO NEPHROURETERAL CATH  05/21/2017   IR NEPHROSTOMY PLACEMENT RIGHT  04/13/2017    Home Medications:  Allergies as of 12/15/2021   No Known Allergies      Medication List        Accurate as of December 15, 2021 11:59 PM. If you have any questions, ask your nurse or doctor.  acetaminophen 500 MG tablet Commonly known as: TYLENOL Take 500 mg by mouth every 4 (four) hours as needed for moderate pain or fever.   atorvastatin 10 MG tablet Commonly known as: LIPITOR Take 10 mg by mouth daily at 6 PM.   Calcium 600+D3 600-20  MG-MCG Tabs Generic drug: Calcium Carb-Cholecalciferol Take 1 tablet by mouth every evening.   Combivent Respimat 20-100 MCG/ACT Aers respimat Generic drug: Ipratropium-Albuterol Inhale 1 puff into the lungs 4 (four) times daily.   glimepiride 2 MG tablet Commonly known as: AMARYL Take 2 mg by mouth daily with breakfast.   glucose blood test strip Commonly known as: Accu-Chek Guide Use asdirected three times a day diag E11.65   ibuprofen 200 MG tablet Commonly known as: ADVIL Take 200 mg by mouth every 6 (six) hours as needed for fever or moderate pain.   iron polysaccharides 150 MG capsule Commonly known as: NIFEREX Take 150 mg by mouth daily.   metFORMIN 1000 MG tablet Commonly known as: GLUCOPHAGE Take 1,000 mg by mouth 2 (two) times daily.   metoprolol succinate 25 MG 24 hr tablet Commonly known as: TOPROL-XL TAKE 1 TABLET BY MOUTH EVERY DAY FOR BLOOD PRESSURE   multivitamin with minerals tablet Take 1 tablet by mouth daily.   Myrbetriq 25 MG Tb24 tablet Generic drug: mirabegron ER TAKE 1 TABLET (25 MG TOTAL) BY MOUTH DAILY.   omeprazole 20 MG capsule Commonly known as: PRILOSEC TAKE ONE CAPSULE BY MOUTH EVERY DAY   oxyCODONE 5 MG immediate release tablet Commonly known as: Oxy IR/ROXICODONE Take 1 tablet (5 mg total) by mouth every 4 (four) hours as needed for severe pain.   pioglitazone 30 MG tablet Commonly known as: ACTOS Take 30 mg by mouth daily.   traMADol 50 MG tablet Commonly known as: ULTRAM Take 50 mg by mouth every 8 (eight) hours as needed for moderate pain or severe pain.   Xtandi 40 MG tablet Generic drug: enzalutamide Take 2 tablets (80 mg total) by mouth daily.        Allergies: No Known Allergies  Family History: Family History  Problem Relation Age of Onset   Hypertension Father     Social History:  reports that he has never smoked. He has never used smokeless tobacco. He reports that he does not drink alcohol and does not  use drugs.   Physical Exam: BP (!) 142/83   Pulse 82   Ht '5\' 6"'$  (1.676 m)   BMI 19.53 kg/m   Constitutional:  Alert and oriented, No acute distress. HEENT: [place] AT, moist mucus membranes.  Trachea midline, no masses. Cardiovascular: No clubbing, cyanosis, or edema. Respiratory: Normal respiratory effort, no increased work of breathing. GU: Foley catheter draining clear yellow urine, uncircumcised phallus Skin: No rashes, bruises or suspicious lesions. Neurologic: Grossly intact, no focal deficits, moving all 4 extremities. Psychiatric: Normal mood and affect.  Laboratory Data: Lab Results  Component Value Date   WBC 7.9 11/25/2021   HGB 13.9 11/25/2021   HCT 40.7 11/25/2021   MCV 88.5 11/25/2021   PLT 191 11/25/2021    Lab Results  Component Value Date   CREATININE 0.85 11/25/2021     Lab Results  Component Value Date   HGBA1C 7.2 (H) 11/19/2021   Procedure: Foley catheter was removed.  He was prepped and draped in standard sterile fashion.  Lubricant was applied per urethral meatus.  A 16 French coud catheter was then advanced quite easily into the bladder, the balloon was filled  with 10 cc of sterile water.  The procedure was well-tolerated.  There was no trauma or bleeding noted.  Assessment & Plan:    1. Chronic retention of urine We will require acute monthly Foley catheter exchanges  2. Chronic bacteriuria We treated periprocedurally today with antibiotics but will attempt at Foley catheter exchange but will attempt with this given minimal trauma today and ease of catheter replacement  his daughter is agreeable this plan  3. Prostate cancer [facility]) Managed by medical oncology, given progression to more certain that his bladder lesion in fact is metastatic prostate cancer rather than a new primary  F/u 1 mo foley exchange  [name], MD  [facility] [address], [place], [place] [address] [phone]

## 2021-12-19 ENCOUNTER — Ambulatory Visit
Admission: RE | Admit: 2021-12-19 | Discharge: 2021-12-19 | Disposition: A | Discharge status: Home / Self Care | Payer: Medicare HMO | Source: Ambulatory Visit | Attending: Radiation Oncology | Admitting: Radiation Oncology

## 2021-12-19 ENCOUNTER — Encounter (INDEPENDENT_AMBULATORY_CARE_PROVIDER_SITE_OTHER): Payer: Self-pay

## 2021-12-19 ENCOUNTER — Other Ambulatory Visit: Payer: Self-pay

## 2021-12-19 DIAGNOSIS — Z510 Encounter for antineoplastic radiation therapy: Secondary | ICD-10-CM | POA: Diagnosis not present

## 2021-12-19 DIAGNOSIS — G893 Neoplasm related pain (acute) (chronic): Secondary | ICD-10-CM | POA: Diagnosis not present

## 2021-12-19 DIAGNOSIS — C7951 Secondary malignant neoplasm of bone: Secondary | ICD-10-CM | POA: Diagnosis not present

## 2021-12-19 DIAGNOSIS — C61 Malignant neoplasm of prostate: Secondary | ICD-10-CM | POA: Diagnosis not present

## 2021-12-19 LAB — RAD ONC ARIA SESSION SUMMARY
Course Elapsed Days: 4
Plan Fractions Treated to Date: 3
Plan Prescribed Dose Per Fraction: 3 Gy
Plan Total Fractions Prescribed: 10
Plan Total Prescribed Dose: 30 Gy
Reference Point Dosage Given to Date: 9 Gy
Reference Point Session Dosage Given: 3 Gy
Session Number: 3

## 2021-12-20 ENCOUNTER — Ambulatory Visit
Admission: RE | Admit: 2021-12-20 | Discharge: 2021-12-20 | Disposition: A | Discharge status: Home / Self Care | Payer: Medicare HMO | Source: Ambulatory Visit | Attending: Radiation Oncology | Admitting: Radiation Oncology

## 2021-12-20 ENCOUNTER — Other Ambulatory Visit: Payer: Self-pay

## 2021-12-20 DIAGNOSIS — G893 Neoplasm related pain (acute) (chronic): Secondary | ICD-10-CM | POA: Diagnosis not present

## 2021-12-20 DIAGNOSIS — C7951 Secondary malignant neoplasm of bone: Secondary | ICD-10-CM | POA: Diagnosis not present

## 2021-12-20 DIAGNOSIS — Z510 Encounter for antineoplastic radiation therapy: Secondary | ICD-10-CM | POA: Diagnosis not present

## 2021-12-20 DIAGNOSIS — C61 Malignant neoplasm of prostate: Secondary | ICD-10-CM | POA: Diagnosis not present

## 2021-12-20 LAB — RAD ONC ARIA SESSION SUMMARY
Course Elapsed Days: 5
Plan Fractions Treated to Date: 4
Plan Prescribed Dose Per Fraction: 3 Gy
Plan Total Fractions Prescribed: 10
Plan Total Prescribed Dose: 30 Gy
Reference Point Dosage Given to Date: 12 Gy
Reference Point Session Dosage Given: 3 Gy
Session Number: 4

## 2021-12-21 ENCOUNTER — Ambulatory Visit
Admission: RE | Admit: 2021-12-21 | Discharge: 2021-12-21 | Disposition: A | Discharge status: Home / Self Care | Payer: Medicare HMO | Source: Ambulatory Visit | Attending: Radiation Oncology | Admitting: Radiation Oncology

## 2021-12-21 ENCOUNTER — Other Ambulatory Visit: Payer: Self-pay

## 2021-12-21 DIAGNOSIS — C7951 Secondary malignant neoplasm of bone: Secondary | ICD-10-CM | POA: Diagnosis not present

## 2021-12-21 DIAGNOSIS — Z510 Encounter for antineoplastic radiation therapy: Secondary | ICD-10-CM | POA: Diagnosis not present

## 2021-12-21 DIAGNOSIS — C61 Malignant neoplasm of prostate: Secondary | ICD-10-CM | POA: Diagnosis not present

## 2021-12-21 DIAGNOSIS — G893 Neoplasm related pain (acute) (chronic): Secondary | ICD-10-CM | POA: Insufficient documentation

## 2021-12-21 LAB — RAD ONC ARIA SESSION SUMMARY
Course Elapsed Days: 6
Plan Fractions Treated to Date: 5
Plan Prescribed Dose Per Fraction: 3 Gy
Plan Total Fractions Prescribed: 10
Plan Total Prescribed Dose: 30 Gy
Reference Point Dosage Given to Date: 15 Gy
Reference Point Session Dosage Given: 3 Gy
Session Number: 5

## 2021-12-22 ENCOUNTER — Other Ambulatory Visit (HOSPITAL_COMMUNITY): Payer: Self-pay

## 2021-12-22 ENCOUNTER — Other Ambulatory Visit: Payer: Self-pay

## 2021-12-22 ENCOUNTER — Ambulatory Visit
Admission: RE | Admit: 2021-12-22 | Discharge: 2021-12-22 | Disposition: A | Discharge status: Home / Self Care | Payer: Medicare HMO | Source: Ambulatory Visit | Attending: Radiation Oncology | Admitting: Radiation Oncology

## 2021-12-22 DIAGNOSIS — Z510 Encounter for antineoplastic radiation therapy: Secondary | ICD-10-CM | POA: Diagnosis not present

## 2021-12-22 DIAGNOSIS — C61 Malignant neoplasm of prostate: Secondary | ICD-10-CM | POA: Diagnosis not present

## 2021-12-22 DIAGNOSIS — G893 Neoplasm related pain (acute) (chronic): Secondary | ICD-10-CM | POA: Diagnosis not present

## 2021-12-22 DIAGNOSIS — Z191 Hormone sensitive malignancy status: Secondary | ICD-10-CM | POA: Diagnosis not present

## 2021-12-22 DIAGNOSIS — C7951 Secondary malignant neoplasm of bone: Secondary | ICD-10-CM | POA: Diagnosis not present

## 2021-12-22 LAB — RAD ONC ARIA SESSION SUMMARY
Course Elapsed Days: 7
Plan Fractions Treated to Date: 6
Plan Prescribed Dose Per Fraction: 3 Gy
Plan Total Fractions Prescribed: 10
Plan Total Prescribed Dose: 30 Gy
Reference Point Dosage Given to Date: 18 Gy
Reference Point Session Dosage Given: 3 Gy
Session Number: 6

## 2021-12-23 ENCOUNTER — Other Ambulatory Visit: Payer: Self-pay

## 2021-12-23 ENCOUNTER — Ambulatory Visit
Admission: RE | Admit: 2021-12-23 | Discharge: 2021-12-23 | Disposition: A | Discharge status: Home / Self Care | Payer: Medicare HMO | Source: Ambulatory Visit | Attending: Radiation Oncology | Admitting: Radiation Oncology

## 2021-12-23 DIAGNOSIS — Z510 Encounter for antineoplastic radiation therapy: Secondary | ICD-10-CM | POA: Diagnosis not present

## 2021-12-23 DIAGNOSIS — C7951 Secondary malignant neoplasm of bone: Secondary | ICD-10-CM | POA: Diagnosis not present

## 2021-12-23 DIAGNOSIS — G893 Neoplasm related pain (acute) (chronic): Secondary | ICD-10-CM | POA: Diagnosis not present

## 2021-12-23 DIAGNOSIS — C61 Malignant neoplasm of prostate: Secondary | ICD-10-CM | POA: Diagnosis not present

## 2021-12-23 LAB — RAD ONC ARIA SESSION SUMMARY
Course Elapsed Days: 8
Plan Fractions Treated to Date: 7
Plan Prescribed Dose Per Fraction: 3 Gy
Plan Total Fractions Prescribed: 10
Plan Total Prescribed Dose: 30 Gy
Reference Point Dosage Given to Date: 21 Gy
Reference Point Session Dosage Given: 3 Gy
Session Number: 7

## 2021-12-24 DIAGNOSIS — E119 Type 2 diabetes mellitus without complications: Secondary | ICD-10-CM | POA: Diagnosis not present

## 2021-12-24 DIAGNOSIS — U071 COVID-19: Secondary | ICD-10-CM | POA: Diagnosis not present

## 2021-12-24 DIAGNOSIS — B9620 Unspecified Escherichia coli [E. coli] as the cause of diseases classified elsewhere: Secondary | ICD-10-CM | POA: Diagnosis not present

## 2021-12-24 DIAGNOSIS — Z452 Encounter for adjustment and management of vascular access device: Secondary | ICD-10-CM | POA: Diagnosis not present

## 2021-12-24 DIAGNOSIS — C7951 Secondary malignant neoplasm of bone: Secondary | ICD-10-CM | POA: Diagnosis not present

## 2021-12-24 DIAGNOSIS — C61 Malignant neoplasm of prostate: Secondary | ICD-10-CM | POA: Diagnosis not present

## 2021-12-24 DIAGNOSIS — I10 Essential (primary) hypertension: Secondary | ICD-10-CM | POA: Diagnosis not present

## 2021-12-24 DIAGNOSIS — N390 Urinary tract infection, site not specified: Secondary | ICD-10-CM | POA: Diagnosis not present

## 2021-12-24 DIAGNOSIS — J1282 Pneumonia due to coronavirus disease 2019: Secondary | ICD-10-CM | POA: Diagnosis not present

## 2021-12-26 ENCOUNTER — Other Ambulatory Visit: Payer: Self-pay

## 2021-12-26 ENCOUNTER — Ambulatory Visit
Admission: RE | Admit: 2021-12-26 | Discharge: 2021-12-26 | Disposition: A | Discharge status: Home / Self Care | Payer: Medicare HMO | Source: Ambulatory Visit | Attending: Radiation Oncology | Admitting: Radiation Oncology

## 2021-12-26 DIAGNOSIS — G893 Neoplasm related pain (acute) (chronic): Secondary | ICD-10-CM | POA: Diagnosis not present

## 2021-12-26 DIAGNOSIS — M79601 Pain in right arm: Secondary | ICD-10-CM | POA: Diagnosis not present

## 2021-12-26 DIAGNOSIS — Z510 Encounter for antineoplastic radiation therapy: Secondary | ICD-10-CM | POA: Diagnosis not present

## 2021-12-26 DIAGNOSIS — E1159 Type 2 diabetes mellitus with other circulatory complications: Secondary | ICD-10-CM | POA: Diagnosis not present

## 2021-12-26 DIAGNOSIS — I10 Essential (primary) hypertension: Secondary | ICD-10-CM | POA: Diagnosis not present

## 2021-12-26 DIAGNOSIS — C7951 Secondary malignant neoplasm of bone: Secondary | ICD-10-CM | POA: Diagnosis not present

## 2021-12-26 DIAGNOSIS — C61 Malignant neoplasm of prostate: Secondary | ICD-10-CM | POA: Diagnosis not present

## 2021-12-26 LAB — RAD ONC ARIA SESSION SUMMARY
Course Elapsed Days: 11
Plan Fractions Treated to Date: 8
Plan Prescribed Dose Per Fraction: 3 Gy
Plan Total Fractions Prescribed: 10
Plan Total Prescribed Dose: 30 Gy
Reference Point Dosage Given to Date: 24 Gy
Reference Point Session Dosage Given: 3 Gy
Session Number: 8

## 2021-12-27 ENCOUNTER — Ambulatory Visit: Payer: Medicare HMO

## 2021-12-27 ENCOUNTER — Telehealth: Payer: Self-pay

## 2021-12-27 ENCOUNTER — Ambulatory Visit: Admission: RE | Admit: 2021-12-27 | Payer: Medicare HMO | Source: Ambulatory Visit

## 2021-12-27 NOTE — Telephone Encounter (Signed)
-----   Message from Tsosie Billing, MD sent at 12/26/2021  7:59 PM EST ----- Can you please check to see whether MIDline was removed- The daughter wanted to keep it for a few more days ( this was 2 weeks ago). Please remove if not already done thx

## 2021-12-27 NOTE — Telephone Encounter (Signed)
I attempted  to contact patient's daughter regarding midline removal. Patient's daughter did not answer and I left a voicemail for her to call our office back. Michael [name]

## 2021-12-28 ENCOUNTER — Other Ambulatory Visit (HOSPITAL_COMMUNITY): Payer: Self-pay

## 2021-12-28 ENCOUNTER — Ambulatory Visit: Payer: Medicare HMO

## 2021-12-28 ENCOUNTER — Other Ambulatory Visit (INDEPENDENT_AMBULATORY_CARE_PROVIDER_SITE_OTHER): Payer: Self-pay | Admitting: Nurse Practitioner

## 2021-12-28 NOTE — Telephone Encounter (Signed)
I spoke to patient's daughter [name] and she stated Midline has been removed.  Martise Waddell T Brooks Sailors

## 2021-12-29 ENCOUNTER — Ambulatory Visit: Payer: Medicare HMO

## 2021-12-30 ENCOUNTER — Ambulatory Visit: Payer: Medicare HMO

## 2021-12-30 DIAGNOSIS — M545 Low back pain, unspecified: Secondary | ICD-10-CM | POA: Diagnosis not present

## 2021-12-30 DIAGNOSIS — Z9289 Personal history of other medical treatment: Secondary | ICD-10-CM | POA: Diagnosis not present

## 2021-12-30 DIAGNOSIS — Z978 Presence of other specified devices: Secondary | ICD-10-CM | POA: Diagnosis not present

## 2021-12-30 DIAGNOSIS — Z0000 Encounter for general adult medical examination without abnormal findings: Secondary | ICD-10-CM | POA: Diagnosis not present

## 2021-12-30 DIAGNOSIS — E119 Type 2 diabetes mellitus without complications: Secondary | ICD-10-CM | POA: Diagnosis not present

## 2021-12-30 DIAGNOSIS — I10 Essential (primary) hypertension: Secondary | ICD-10-CM | POA: Diagnosis not present

## 2021-12-30 DIAGNOSIS — Z23 Encounter for immunization: Secondary | ICD-10-CM | POA: Diagnosis not present

## 2021-12-30 DIAGNOSIS — D649 Anemia, unspecified: Secondary | ICD-10-CM | POA: Diagnosis not present

## 2021-12-30 DIAGNOSIS — R338 Other retention of urine: Secondary | ICD-10-CM | POA: Diagnosis not present

## 2021-12-30 DIAGNOSIS — R339 Retention of urine, unspecified: Secondary | ICD-10-CM | POA: Diagnosis not present

## 2021-12-30 DIAGNOSIS — C61 Malignant neoplasm of prostate: Secondary | ICD-10-CM | POA: Diagnosis not present

## 2022-01-02 ENCOUNTER — Ambulatory Visit: Payer: Medicare HMO

## 2022-01-03 ENCOUNTER — Ambulatory Visit: Payer: Medicare HMO

## 2022-01-06 ENCOUNTER — Inpatient Hospital Stay: Payer: Medicare HMO

## 2022-01-06 ENCOUNTER — Encounter: Payer: Self-pay | Admitting: Nurse Practitioner

## 2022-01-06 ENCOUNTER — Inpatient Hospital Stay: Payer: Medicare HMO | Attending: Internal Medicine | Admitting: Nurse Practitioner

## 2022-01-06 VITALS — BP 116/69 | HR 82 | Temp 96.9°F | Wt 122.2 lb

## 2022-01-06 DIAGNOSIS — R54 Age-related physical debility: Secondary | ICD-10-CM | POA: Diagnosis not present

## 2022-01-06 DIAGNOSIS — C7951 Secondary malignant neoplasm of bone: Secondary | ICD-10-CM | POA: Diagnosis not present

## 2022-01-06 DIAGNOSIS — Z79818 Long term (current) use of other agents affecting estrogen receptors and estrogen levels: Secondary | ICD-10-CM

## 2022-01-06 DIAGNOSIS — C61 Malignant neoplasm of prostate: Secondary | ICD-10-CM | POA: Insufficient documentation

## 2022-01-06 DIAGNOSIS — M79601 Pain in right arm: Secondary | ICD-10-CM | POA: Insufficient documentation

## 2022-01-06 DIAGNOSIS — R339 Retention of urine, unspecified: Secondary | ICD-10-CM | POA: Insufficient documentation

## 2022-01-06 DIAGNOSIS — E119 Type 2 diabetes mellitus without complications: Secondary | ICD-10-CM | POA: Insufficient documentation

## 2022-01-06 DIAGNOSIS — D649 Anemia, unspecified: Secondary | ICD-10-CM

## 2022-01-06 DIAGNOSIS — D509 Iron deficiency anemia, unspecified: Secondary | ICD-10-CM | POA: Diagnosis not present

## 2022-01-06 LAB — CBC WITH DIFFERENTIAL/PLATELET
Abs Immature Granulocytes: 0.21 10*3/uL — ABNORMAL HIGH (ref 0.00–0.07)
Basophils Absolute: 0 10*3/uL (ref 0.0–0.1)
Basophils Relative: 1 %
Eosinophils Absolute: 0.1 10*3/uL (ref 0.0–0.5)
Eosinophils Relative: 2 %
HCT: 34.7 % — ABNORMAL LOW (ref 39.0–52.0)
Hemoglobin: 11.5 g/dL — ABNORMAL LOW (ref 13.0–17.0)
Immature Granulocytes: 5 %
Lymphocytes Relative: 12 %
Lymphs Abs: 0.5 10*3/uL — ABNORMAL LOW (ref 0.7–4.0)
MCH: 30.6 pg (ref 26.0–34.0)
MCHC: 33.1 g/dL (ref 30.0–36.0)
MCV: 92.3 fL (ref 80.0–100.0)
Monocytes Absolute: 0.7 10*3/uL (ref 0.1–1.0)
Monocytes Relative: 17 %
Neutro Abs: 2.8 10*3/uL (ref 1.7–7.7)
Neutrophils Relative %: 63 %
Platelets: 209 10*3/uL (ref 150–400)
RBC: 3.76 MIL/uL — ABNORMAL LOW (ref 4.22–5.81)
RDW: 15.1 % (ref 11.5–15.5)
WBC: 4.4 10*3/uL (ref 4.0–10.5)
nRBC: 0 % (ref 0.0–0.2)

## 2022-01-06 LAB — COMPREHENSIVE METABOLIC PANEL
ALT: 13 U/L (ref 0–44)
AST: 24 U/L (ref 15–41)
Albumin: 3.6 g/dL (ref 3.5–5.0)
Alkaline Phosphatase: 56 U/L (ref 38–126)
Anion gap: 9 (ref 5–15)
BUN: 19 mg/dL (ref 8–23)
CO2: 24 mmol/L (ref 22–32)
Calcium: 9.3 mg/dL (ref 8.9–10.3)
Chloride: 100 mmol/L (ref 98–111)
Creatinine, Ser: 0.92 mg/dL (ref 0.61–1.24)
GFR, Estimated: >60 mL/min (ref >60)
Glucose, Bld: 146 mg/dL — ABNORMAL HIGH (ref 70–99)
Potassium: 4.4 mmol/L (ref 3.5–5.1)
Sodium: 133 mmol/L — ABNORMAL LOW (ref 135–145)
Total Bilirubin: 0.2 mg/dL — ABNORMAL LOW (ref 0.3–1.2)
Total Protein: 7.3 g/dL (ref 6.5–8.1)

## 2022-01-06 LAB — PSA: Prostatic Specific Antigen: 0.78 ng/mL (ref 0.00–4.00)

## 2022-01-06 NOTE — Progress Notes (Signed)
Michael [name] OFFICE PROGRESS NOTE  Patient Care Team: Michael Harrier, MD as PCP - General (Internal Medicine) Michael [name], MD as PCP - Cardiology (Cardiology) Michael Sickle, MD as Consulting Physician (Oncology)   Cancer Staging  No matching staging information was found for the patient.  Oncology History Overview Note  # FEB 2019-Metastatic prostate cancer/ castrate sensitive [bulky retroperitoneal adenopathy; invasion into the bladder; rectum] Bladder Bx- prostate. On Degarelix  # April 3rd 2019- [name] [186md];  Lupron q 6 M [[id number]]; MID AUG 2019- reduced dose to 85mday  #February 2023 bone scan PET scan progression of disease -bone metastases/ metastses-on RT to left hip/ lumbar area.-Complicated by multiple enteritis.  #May 2023 -right arm pain-secondary to tumor compression-MRI spine C7-T10 -status post evaluation with Dr. [name] [facility] 30 [facility]   # Poorly controlled DM-on insulin  # s/p right PCN [explanted]/ Foley cath [Dr.Brandon]; multiple UTIs. [s/p ID; Dr.Ravishankar]  ----------------------------------------------------------------- DIAGNOSIS: [ FEB 2019] MET. PROSTATE CA  STAGE:   IV ;GOALS: PALLIATIVE  CURRENT/MOST RECENT THERAPY [ Feb-April2019] Lupron+ [name]    [facility] with [facility] 05/13/2021 Chemotherapy   Patient is on Treatment Plan : Prostate cancer- Docetaxel q weekly     09/30/2021 -  Chemotherapy   Patient is on Treatment Plan : prostate cancer Docetaxel (25) q7d        INTERVAL HISTORY: Ambulating in a wheel chair.  Accompanied by his daughter.  [name]  male pleasant patient above history of castrate resistant metastatic prostate cancer currently on Xtandi/ELigard is here for follow-up. He is eager to have foley catheter removed [facility] see if this would be an option. Daughter is worried about infection risk of keeping foley. He is otherwise stable. Back pain has improved with radiation.   Review of Systems  Constitutional:  Positive for malaise/fatigue. Negative for chills, diaphoresis, fever and weight loss.  HENT:  Negative for nosebleeds and sore throat.   Eyes:  Negative for double vision.  Respiratory:  Negative for cough, hemoptysis, sputum production and wheezing.   Cardiovascular:  Negative for chest pain, palpitations, orthopnea and leg swelling.  Gastrointestinal:  Negative for abdominal pain, blood in stool, constipation, diarrhea, heartburn, melena, nausea and vomiting.  Genitourinary:        Foley  Musculoskeletal:  Negative for back pain and joint pain.  Skin: Negative.  Negative for itching and rash.  Neurological:  Negative for dizziness, tingling, focal weakness, weakness and headaches.  Endo/Heme/Allergies:  Does not bruise/bleed easily.  Psychiatric/Behavioral:  Negative for depression. The patient is not nervous/anxious and does not have insomnia.     PAST MEDICAL HISTORY :  Past Medical History:  Diagnosis Date   Anemia    iron deficiency   Aortic atherosclerosis (HCC)    Bilateral carotid artery disease (HCC)    Mild plaque formation without obstructive disease noted on carotid Doppler.   Diabetes mellitus without complication ([facility]   Essential hypertension    GERD (gastroesophageal reflux disease)    Hyperlipidemia    Hypertension    Left carotid artery stenosis    Nonobstructive Coronary artery disease    a. [facility] The patient was told about 2 blockages which did not require revascularization; b. 01/2015 Cath: LM nl, LAD 10p/m, 4542m  D2 30ost, LCX nl, RCA min irregs, EF 55-65%; b. 12/2018 MV: No ischemia/infarct. CT images w/ mod 3 vessel Cor Ca2+ and mild-mod Ao atherosclerosis.   Prostate cancer ([place]) 03/2017    Raynaud disease    a. Managed w/ nifedipine.    PAST SURGICAL HISTORY :   Past Surgical History:  Procedure Laterality Date   CARDIAC CATHETERIZATION  2010   CARDIAC CATHETERIZATION N/A 02/03/2015   Procedure: Left Heart Cath and Coronary Angiography;  Surgeon: Michael [name], MD;  Location: [facility];  Service: Cardiovascular;  Laterality: N/A;   CATARACT EXTRACTION Bilateral    CYSTOSCOPY W/ RETROGRADES Bilateral 04/11/2017   Procedure: CYSTOSCOPY WITH RETROGRADE PYELOGRAM;  Surgeon: Michael Espy, MD;  Location: [facility];  Service: Urology;  Laterality: Bilateral;   CYSTOSCOPY W/ RETROGRADES Bilateral 09/12/2017   Procedure: CYSTOSCOPY WITH RETROGRADE PYELOGRAM;  Surgeon: Michael Espy, MD;  Location: [facility];  Service: Urology;  Laterality: Bilateral;   CYSTOSCOPY W/ RETROGRADES Bilateral 02/25/2018   Procedure: CYSTOSCOPY WITH RETROGRADE PYELOGRAM;  Surgeon: Michael Espy, MD;  Location: [facility];  Service: Urology;  Laterality: Bilateral;   CYSTOSCOPY W/ URETERAL STENT PLACEMENT Bilateral 09/12/2017   Procedure: CYSTOSCOPY WITH STENT REPLACEMENT;  Surgeon: Michael Espy, MD;  Location: [facility];  Service: Urology;  Laterality: Bilateral;   CYSTOSCOPY W/ URETERAL STENT REMOVAL Bilateral 02/25/2018   Procedure: CYSTOSCOPY WITH STENT REMOVAL;  Surgeon: Michael Espy, MD;  Location: [facility];  Service: Urology;  Laterality: Bilateral;   CYSTOSCOPY WITH STENT PLACEMENT Left 04/11/2017   Procedure: CYSTOSCOPY WITH STENT PLACEMENT and fulgeration;  Surgeon: Michael Espy, MD;  Location: [facility];  Service: Urology;  Laterality: Left;   CYSTOSCOPY WITH URETHRAL DILATATION Bilateral 04/11/2017   Procedure: CYSTOSCOPY WITH URETHRAL DILATATION;  Surgeon: Michael Espy, MD;  Location: [facility];  Service: Urology;  Laterality: Bilateral;   IR CONVERT RIGHT NEPHROSTOMY TO NEPHROURETERAL CATH  05/21/2017   IR NEPHROSTOMY PLACEMENT RIGHT  04/13/2017    FAMILY HISTORY :   Family  History  Problem Relation Age of Onset   Hypertension Father     SOCIAL HISTORY:   Social History   Tobacco Use   Smoking status: Never   Smokeless tobacco: Never  Vaping Use   Vaping Use: Never used  Substance Use Topics   Alcohol use: No   Drug use: No    ALLERGIES:  has No Known Allergies.  MEDICATIONS:  Current Outpatient Medications  Medication Sig Dispense Refill   acetaminophen (TYLENOL) 500 MG tablet Take 500 mg by mouth every 4 (four) hours as needed for moderate pain or fever.     atorvastatin (LIPITOR) 10 MG tablet Take 10 mg by mouth daily at 6 PM.      Calcium Carb-Cholecalciferol (CALCIUM 600+D3) 600-800 MG-UNIT TABS Take 1 tablet by mouth every evening.      COMBIVENT RESPIMAT 20-100 MCG/ACT AERS respimat Inhale 1 puff into the lungs 4 (four) times daily.     enzalutamide (XTANDI) 40 MG tablet Take 2 tablets (80 mg total) by mouth daily. 60 tablet 3   glimepiride (AMARYL) 2 MG tablet Take 2 mg by mouth daily with breakfast.     glucose blood (ACCU-CHEK GUIDE) test strip Use asdirected three times a day diag E11.65 100 each 3   ibuprofen (ADVIL,MOTRIN) 200 MG tablet Take 200 mg by mouth every 6 (six) hours as needed for fever or moderate pain.      iron polysaccharides (NIFEREX) 150 MG capsule Take 150 mg by mouth  daily.      metFORMIN (GLUCOPHAGE) 1000 MG tablet Take 1,000 mg by mouth 2 (two) times daily.     metoprolol succinate (TOPROL-XL) 25 MG 24 hr tablet TAKE 1 TABLET BY MOUTH EVERY DAY FOR BLOOD PRESSURE 90 tablet 3   Multiple Vitamins-Minerals (MULTIVITAMIN WITH MINERALS) tablet Take 1 tablet by mouth daily.      MYRBETRIQ 25 MG TB24 tablet TAKE 1 TABLET (25 MG TOTAL) BY MOUTH DAILY. 90 tablet 3   omeprazole (PRILOSEC) 20 MG capsule TAKE ONE CAPSULE BY MOUTH EVERY DAY 90 capsule 2   oxyCODONE (OXY IR/ROXICODONE) 5 MG immediate release tablet Take 1 tablet (5 mg total) by mouth every 4 (four) hours as needed for severe pain. 45 tablet 0   pioglitazone  (ACTOS) 30 MG tablet Take 30 mg by mouth daily.     traMADol (ULTRAM) 50 MG tablet Take 50 mg by mouth every 8 (eight) hours as needed for moderate pain or severe pain.     No current facility-administered medications for this visit.    PHYSICAL EXAMINATION: ECOG PERFORMANCE STATUS: 1 - Symptomatic but completely ambulatory  BP 116/69 (BP Location: Right Arm, Patient Position: Sitting)   Pulse 82   Temp (!) 96.9 F (36.1 C) (Tympanic)   Wt 122 lb 3.2 oz (55.4 kg)   BMI 19.72 kg/m   Filed Weights   01/06/22 1025  Weight: 122 lb 3.2 oz (55.4 kg)   Physical Exam Constitutional:      Comments: Frail; in wheelchair  HENT:     Head: Normocephalic and atraumatic.     Mouth/Throat:     Pharynx: No oropharyngeal exudate.  Cardiovascular:     Rate and Rhythm: Normal rate and regular rhythm.  Pulmonary:     Effort: Pulmonary effort is normal. No respiratory distress.     Breath sounds: Normal breath sounds. No wheezing.  Abdominal:     General: Bowel sounds are normal. There is no distension.     Palpations: Abdomen is soft. There is no mass.     Tenderness: There is no abdominal tenderness. There is no guarding or rebound.  Musculoskeletal:        General: No tenderness. Normal range of motion.     Cervical back: Normal range of motion and neck supple.  Skin:    General: Skin is warm.  Neurological:     Mental Status: He is alert and oriented to person, place, and time.  Psychiatric:        Mood and Affect: Mood and affect normal.        Behavior: Behavior normal.     LABORATORY DATA:  I have reviewed the data as listed    Component Value Date/Time   NA 133 (L) 01/06/2022 1010   NA 136 05/07/2012 1328   K 4.4 01/06/2022 1010   K 4.5 05/07/2012 1328   CL 100 01/06/2022 1010   CL 104 05/07/2012 1328   CO2 24 01/06/2022 1010   CO2 28 05/07/2012 1328   GLUCOSE 146 (H) 01/06/2022 1010   GLUCOSE 73 05/07/2012 1328   BUN 19 01/06/2022 1010   BUN 16 05/07/2012 1328    CREATININE 0.92 01/06/2022 1010   CREATININE 0.84 05/07/2012 1328   CALCIUM 9.3 01/06/2022 1010   CALCIUM 8.8 05/07/2012 1328   PROT 7.3 01/06/2022 1010   ALBUMIN 3.6 01/06/2022 1010   AST 24 01/06/2022 1010   ALT 13 01/06/2022 1010   ALKPHOS 56 01/06/2022 1010   BILITOT 0.2 (  L) 01/06/2022 1010   GFRNONAA >60 01/06/2022 1010   GFRNONAA >60 05/07/2012 1328   GFRAA >60 11/17/2019 0819   GFRAA >60 05/07/2012 1328   Lab Results  Component Value Date   WBC 4.4 01/06/2022   NEUTROABS 2.8 01/06/2022   HGB 11.5 (L) 01/06/2022   HCT 34.7 (L) 01/06/2022   MCV 92.3 01/06/2022   PLT 209 01/06/2022    RADIOGRAPHIC STUDIES: I have personally reviewed the radiological images as listed and agreed with the findings in the report. No results found.   ASSESSMENT & PLAN:  No problem-specific Assessment & Plan notes found for this encounter.  Metastatic prostate cancer (feb 2019) castrate resistant- pet scan feb 2023 showed large lesion in inferior left iliac bone and l5 vertebral body, proximal femur, T2, multiple other areas but not fdg avid, Positive metastatic LN in left pelvis but no distant nodal mets or visceral mets. May 2023 MRI cervical and thoracic spine showed epidural tumor causing infiltration. C7-T4 causing right arm pain. MRI as below. Currently s/p cervical spine radiation with improvement in pain. PSA has been downtrending. AP-CT 11/2021- no visible disease or progressive disease except for nodular lesion 1.5 cm anterior bladder, possibly increasing in size to 1.9 cm. Unable to biopsy d/t performance status (reviewed 11/30/21 note). Consider IR biopsy if frailty improves? Continue Xtandi plus ADT. Chemo on hold d/t comorbidities and general frailties/recent sepsis. PSA is pending. Clinically, stable/improved. Continue xtandi & eligard for now.  Symptomatic bone metastases- MRI lumbar and thoracic spine concerning for progressive vertebral/epidural disease at T12 and L5 w/o evidence of  cord compression. Symptomatic with pain. He is receiving palliative radiation with Dr Baruch Gouty. Was receiving zometa every 3 months. Last May 2023. Unclear if plan is to continue. Will reach out to Dr Rogue Bussing.  Hx of UTI/sepsis/ESBL E Coli- s/p meropenem via picc. Chronic elevation of lactic acid- ? Malignancy vs infection. Lactic improved on recheck. Stable.  Frailty- poor candidate for chemotherapy Chronic urinary retention & Bladder mass- s/p foley. Managed by Dr Erlene Quan. Awaiting second opinion at [facility].  Right arm pain-secondary to tumor compression-MRI spine C7-T10 -status post   Hypofractionated dose of 30 Gray in 15 fractions.-Significant improvement of pain noted. STABLE.  Anemia- iron deficiency. Hemoglobin 11.5. Worse. Recommend IV venofer in few weeks.  DM-type II- on metformin-STABLE [Dr.Solum]. -need to monitor closely on chemotherapy/steroids. Improved to 146 today. \ IV access: Discussed regarding port; PICC line in place- Left. Hold for now.    * Eligard- next Feb 2023 Zometa- last 07/01/2021   # DISPOSITION:  3-4 weeks- venofer x 23 Mar 2022- lab, see brahmanday, +/- eligard- la    No orders of the defined types were placed in this encounter.   All questions were answered. The patient knows to call the clinic with any problems, questions or concerns.      Verlon Au, NP 01/06/2022 10:44 AM

## 2022-01-16 DIAGNOSIS — E119 Type 2 diabetes mellitus without complications: Secondary | ICD-10-CM | POA: Diagnosis not present

## 2022-01-16 DIAGNOSIS — Z0100 Encounter for examination of eyes and vision without abnormal findings: Secondary | ICD-10-CM | POA: Diagnosis not present

## 2022-01-18 NOTE — Progress Notes (Unsigned)
Cath Change/ Replacement  Patient is present today for a catheter change due to urinary retention.  ***ml of water was removed from the balloon, a ***FR foley cath was removed without difficulty.  Patient was cleaned and prepped in a sterile fashion with betadine and 2% lidocaine jelly was instilled into the urethra. A *** FR foley cath was replaced into the bladder, {dnt complications:20057}. Urine return was noted ***ml and urine was *** in color. The balloon was filled with 10ml of sterile water. A *** bag was attached for drainage.  A night bag was also given to the patient and patient was given instruction on how to change from one bag to another. Patient was given proper instruction on catheter care.    Performed by: ***  Follow up: ***  

## 2022-01-19 ENCOUNTER — Encounter: Payer: Self-pay | Admitting: Urology

## 2022-01-19 ENCOUNTER — Other Ambulatory Visit (HOSPITAL_COMMUNITY): Payer: Self-pay

## 2022-01-19 ENCOUNTER — Ambulatory Visit (INDEPENDENT_AMBULATORY_CARE_PROVIDER_SITE_OTHER): Payer: Medicare HMO | Admitting: Urology

## 2022-01-19 DIAGNOSIS — R339 Retention of urine, unspecified: Secondary | ICD-10-CM

## 2022-01-20 ENCOUNTER — Encounter: Payer: Self-pay | Admitting: Nurse Practitioner

## 2022-01-20 ENCOUNTER — Other Ambulatory Visit (HOSPITAL_COMMUNITY): Payer: Self-pay

## 2022-01-20 NOTE — Telephone Encounter (Signed)
Spoke with patient and they did finally get urine in the bag.

## 2022-01-21 ENCOUNTER — Other Ambulatory Visit (INDEPENDENT_AMBULATORY_CARE_PROVIDER_SITE_OTHER): Payer: Self-pay | Admitting: Nurse Practitioner

## 2022-01-22 ENCOUNTER — Encounter: Payer: Self-pay | Admitting: Internal Medicine

## 2022-01-24 ENCOUNTER — Encounter: Payer: Self-pay | Admitting: Hospice and Palliative Medicine

## 2022-01-24 ENCOUNTER — Encounter: Payer: Self-pay | Admitting: Internal Medicine

## 2022-01-24 ENCOUNTER — Inpatient Hospital Stay: Payer: Medicare HMO | Attending: Internal Medicine | Admitting: Hospice and Palliative Medicine

## 2022-01-24 ENCOUNTER — Telehealth: Payer: Self-pay

## 2022-01-24 VITALS — BP 144/94 | HR 82 | Temp 96.7°F | Ht 66.0 in | Wt 121.4 lb

## 2022-01-24 DIAGNOSIS — C7951 Secondary malignant neoplasm of bone: Secondary | ICD-10-CM | POA: Insufficient documentation

## 2022-01-24 DIAGNOSIS — I7300 Raynaud's syndrome without gangrene: Secondary | ICD-10-CM | POA: Diagnosis not present

## 2022-01-24 DIAGNOSIS — Z79899 Other long term (current) drug therapy: Secondary | ICD-10-CM | POA: Insufficient documentation

## 2022-01-24 DIAGNOSIS — M79601 Pain in right arm: Secondary | ICD-10-CM | POA: Diagnosis not present

## 2022-01-24 DIAGNOSIS — N139 Obstructive and reflux uropathy, unspecified: Secondary | ICD-10-CM | POA: Insufficient documentation

## 2022-01-24 DIAGNOSIS — Z9221 Personal history of antineoplastic chemotherapy: Secondary | ICD-10-CM | POA: Diagnosis not present

## 2022-01-24 DIAGNOSIS — C61 Malignant neoplasm of prostate: Secondary | ICD-10-CM | POA: Diagnosis not present

## 2022-01-24 DIAGNOSIS — Z923 Personal history of irradiation: Secondary | ICD-10-CM | POA: Diagnosis not present

## 2022-01-24 DIAGNOSIS — Z7984 Long term (current) use of oral hypoglycemic drugs: Secondary | ICD-10-CM | POA: Diagnosis not present

## 2022-01-24 DIAGNOSIS — K219 Gastro-esophageal reflux disease without esophagitis: Secondary | ICD-10-CM | POA: Diagnosis not present

## 2022-01-24 DIAGNOSIS — E785 Hyperlipidemia, unspecified: Secondary | ICD-10-CM | POA: Insufficient documentation

## 2022-01-24 DIAGNOSIS — I2510 Atherosclerotic heart disease of native coronary artery without angina pectoris: Secondary | ICD-10-CM | POA: Diagnosis not present

## 2022-01-24 DIAGNOSIS — R590 Localized enlarged lymph nodes: Secondary | ICD-10-CM | POA: Diagnosis not present

## 2022-01-24 DIAGNOSIS — E1165 Type 2 diabetes mellitus with hyperglycemia: Secondary | ICD-10-CM | POA: Insufficient documentation

## 2022-01-24 DIAGNOSIS — I10 Essential (primary) hypertension: Secondary | ICD-10-CM | POA: Diagnosis not present

## 2022-01-24 DIAGNOSIS — E1136 Type 2 diabetes mellitus with diabetic cataract: Secondary | ICD-10-CM | POA: Diagnosis not present

## 2022-01-24 DIAGNOSIS — D509 Iron deficiency anemia, unspecified: Secondary | ICD-10-CM | POA: Insufficient documentation

## 2022-01-24 DIAGNOSIS — G893 Neoplasm related pain (acute) (chronic): Secondary | ICD-10-CM | POA: Insufficient documentation

## 2022-01-24 MED ORDER — OXYCODONE HCL 5 MG PO TABS: 5.0000 mg | ORAL_TABLET | ORAL | 0 refills | Status: DC | PRN

## 2022-01-24 NOTE — Telephone Encounter (Signed)
Called and spoke with Michael [name], patient's daughter per Merrily Pew borders to advise that she take Michael [name] to medical mall at their convenience to have Xray done on right shoulder. Michael [name] verbalized understanding.

## 2022-01-24 NOTE — Progress Notes (Signed)
Symptom Management [place] at [facility] [phone] [phone]  Patient Care Team: [name], MD as PCP - General (Internal Medicine) Wellington [name], MD as PCP - Cardiology (Cardiology) [name], MD as Consulting Physician (Oncology)   NAME OF PATIENT: Michael [name]  [id number]  [birth year]   DATE OF VISIT: 01/24/22  REASON FOR CONSULT: Michael [name] is a 88 y.o. male with multiple medical problems including metastatic prostate cancer with invasion into the bladder and rectum status post RT left hip/lumbar area, on treatment with Xtandi/Eligard.  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  Patient is status post XRT to the spine for treatment of metastatic disease.  He has had chronic right arm pain from vertebral/epidural disease.  INTERVAL HISTORY: MRI of the thoracic and lumbar spine on 12/04/2021 showed extensive osseous metastatic disease throughout the thoracic and lumbar spine with significant progression at T12 with complete metastatic replacement but no cord compression.  Patient presents [name] today for evaluation of right shoulder/arm pain.  This pain has been persistent for many months but has worsened some recently.  Patient denies weakness.  Pain is described as throbbing.  No changes in sensation, numbness or tingling.  Daughter reports that patient previously had improved pain after receiving XRT to the spine.  Patient has been taking tramadol 2-3 times daily but this is no longer fully effective.  PAST MEDICAL HISTORY: Past Medical History:  Diagnosis Date   Anemia    iron deficiency   Aortic atherosclerosis (HCC)    Bilateral carotid artery disease (HCC)    Mild plaque formation without obstructive disease noted on carotid Doppler.   Diabetes mellitus without complication ([place])    Essential hypertension    GERD (gastroesophageal reflux disease)     Hyperlipidemia    Hypertension    Left carotid artery stenosis    Nonobstructive Coronary artery disease    a. Previous cardiac catheterization at [facility] in 2010. The patient was told about 2 blockages which did not require revascularization; b. 01/2015 Cath: LM nl, LAD 10p/m, 65md, D2 30ost, LCX nl, RCA min irregs, EF 55-65%; b. 12/2018 MV: No ischemia/infarct. CT images w/ mod 3 vessel Cor Ca2+ and mild-mod Ao atherosclerosis.   Prostate cancer ([facility] 03/2017   Raynaud disease    a. Managed w/ nifedipine.    PAST SURGICAL HISTORY:  Past Surgical History:  Procedure Laterality Date   CARDIAC CATHETERIZATION  2010   CARDIAC CATHETERIZATION N/A 02/03/2015   Procedure: Left Heart Cath and Coronary Angiography;  Surgeon: MWellington [name] MD;  Location: [facility];  Service: Cardiovascular;  Laterality: N/A;   CATARACT EXTRACTION Bilateral    CYSTOSCOPY W/ RETROGRADES Bilateral 04/11/2017   Procedure: CYSTOSCOPY WITH RETROGRADE PYELOGRAM;  Surgeon: BHollice Espy MD;  Location: [facility];  Service: Urology;  Laterality: Bilateral;   CYSTOSCOPY W/ RETROGRADES Bilateral 09/12/2017   Procedure: CYSTOSCOPY WITH RETROGRADE PYELOGRAM;  Surgeon: BHollice Espy MD;  Location: [facility];  Service: Urology;  Laterality: Bilateral;   CYSTOSCOPY W/ RETROGRADES Bilateral 02/25/2018   Procedure: CYSTOSCOPY WITH RETROGRADE PYELOGRAM;  Surgeon: BHollice Espy MD;  Location: [facility];  Service: Urology;  Laterality: Bilateral;   CYSTOSCOPY W/ URETERAL STENT PLACEMENT Bilateral 09/12/2017   Procedure: CYSTOSCOPY WITH STENT REPLACEMENT;  Surgeon: BHollice Espy MD;  Location: [facility];  Service: Urology;  Laterality: Bilateral;   CYSTOSCOPY W/ URETERAL STENT REMOVAL Bilateral 02/25/2018   Procedure: CYSTOSCOPY WITH STENT REMOVAL;  Surgeon: [name], MD;  Location: [facility];  Service: Urology;  Laterality: Bilateral;   CYSTOSCOPY WITH STENT PLACEMENT Left 04/11/2017   Procedure: CYSTOSCOPY WITH  STENT PLACEMENT and fulgeration;  Surgeon: [name], MD;  Location: [facility];  Service: Urology;  Laterality: Left;   CYSTOSCOPY WITH URETHRAL DILATATION Bilateral 04/11/2017   Procedure: CYSTOSCOPY WITH URETHRAL DILATATION;  Surgeon: [name], MD;  Location: [facility];  Service: Urology;  Laterality: Bilateral;   IR CONVERT RIGHT NEPHROSTOMY TO NEPHROURETERAL CATH  05/21/2017   IR NEPHROSTOMY PLACEMENT RIGHT  04/13/2017    HEMATOLOGY/ONCOLOGY HISTORY:  Oncology History Overview Note  # FEB 2019-Metastatic prostate cancer/ castrate sensitive [bulky retroperitoneal adenopathy; invasion into the bladder; rectum] Bladder Bx- prostate. On Degarelix  # April 3rd 2019- [name] [13md];  Lupron q 6 M [[id number]]; MID AUG 2019- reduced dose to 840mday  #February 2023 bone scan PET scan progression of disease -bone metastases/ metastses-on RT to left hip/ lumbar area.-Complicated by multiple enteritis.  #May 2023 -right arm pain-secondary to tumor compression-MRI spine C7-T10 -status post evaluation with Dr. [name] [facility] 30 [facility]   # Poorly controlled DM-on insulin  # s/p right PCN [explanted]/ Foley cath [Dr.Brandon]; multiple UTIs. [s/p ID; Dr.Ravishankar]  ----------------------------------------------------------------- DIAGNOSIS: [ FEB 2019] MET. PROSTATE CA  STAGE:   IV ;GOALS: PALLIATIVE  CURRENT/MOST RECENT THERAPY [ Feb-April2019] Lupron+ [name]    [facility] with [facility] Chemotherapy   Patient is on Treatment Plan : Prostate cancer- Docetaxel q weekly     09/30/2021 -  Chemotherapy   Patient is on Treatment Plan : prostate cancer Docetaxel (25) q7d       ALLERGIES:  has No Known Allergies.  MEDICATIONS:  Current Outpatient Medications  Medication Sig Dispense Refill   acetaminophen (TYLENOL) 500 MG tablet Take 500 mg by mouth  every 4 (four) hours as needed for moderate pain or fever.     atorvastatin (LIPITOR) 10 MG tablet Take 10 mg by mouth daily at 6 PM.      Calcium Carb-Cholecalciferol (CALCIUM 600+D3) 600-800 MG-UNIT TABS Take 1 tablet by mouth every evening.      COMBIVENT RESPIMAT 20-100 MCG/ACT AERS respimat Inhale 1 puff into the lungs 4 (four) times daily.     enzalutamide (XTANDI) 40 MG tablet Take 2 tablets (80 mg total) by mouth daily. 60 tablet 3   glimepiride (AMARYL) 2 MG tablet Take 2 mg by mouth daily with breakfast.     glucose blood (ACCU-CHEK GUIDE) test strip Use asdirected three times a day diag E11.65 100 each 3   ibuprofen (ADVIL,MOTRIN) 200 MG tablet Take 200 mg by mouth every 6 (six) hours as needed for fever or moderate pain.      iron polysaccharides (NIFEREX) 150 MG capsule Take 150 mg by mouth daily.      metFORMIN (GLUCOPHAGE) 1000 MG tablet Take 1,000 mg by mouth 2 (two) times daily.     metoprolol succinate (TOPROL-XL) 25 MG 24 hr tablet TAKE 1 TABLET BY MOUTH EVERY DAY FOR BLOOD PRESSURE 90 tablet 3   Multiple Vitamins-Minerals (MULTIVITAMIN WITH MINERALS) tablet Take 1 tablet by mouth daily.      MYRBETRIQ 25 MG TB24 tablet TAKE 1 TABLET (25 MG TOTAL) BY MOUTH DAILY. 90 tablet 3   omeprazole (PRILOSEC) 20 MG capsule TAKE ONE CAPSULE BY MOUTH EVERY DAY 90 capsule 2   oxyCODONE (OXY  IR/ROXICODONE) 5 MG immediate release tablet Take 1 tablet (5 mg total) by mouth every 4 (four) hours as needed for severe pain. 45 tablet 0   pioglitazone (ACTOS) 30 MG tablet Take 30 mg by mouth daily.     traMADol (ULTRAM) 50 MG tablet Take 50 mg by mouth every 8 (eight) hours as needed for moderate pain or severe pain.     No current facility-administered medications for this visit.    VITAL SIGNS: There were no vitals taken for this visit. There were no vitals filed for this visit.  Estimated body mass index is 19.72 kg/m as calculated from the following:   Height as of 12/15/21: _0  (1.676  m).   Weight as of 01/06/22: 122 lb 3.2 oz (55.4 kg).  LABS: CBC:    Component Value Date/Time   WBC 4.4 01/06/2022 1010   HGB 11.5 (L) 01/06/2022 1010   HGB 13.7 05/07/2012 1328   HCT 34.7 (L) 01/06/2022 1010   PLT 209 01/06/2022 1010   MCV 92.3 01/06/2022 1010   NEUTROABS 2.8 01/06/2022 1010   LYMPHSABS 0.5 (L) 01/06/2022 1010   MONOABS 0.7 01/06/2022 1010   EOSABS 0.1 01/06/2022 1010   BASOSABS 0.0 01/06/2022 1010   Comprehensive Metabolic Panel:    Component Value Date/Time   NA 133 (L) 01/06/2022 1010   NA 136 05/07/2012 1328   K 4.4 01/06/2022 1010   K 4.5 05/07/2012 1328   CL 100 01/06/2022 1010   CL 104 05/07/2012 1328   CO2 24 01/06/2022 1010   CO2 28 05/07/2012 1328   BUN 19 01/06/2022 1010   BUN 16 05/07/2012 1328   CREATININE 0.92 01/06/2022 1010   CREATININE 0.84 05/07/2012 1328   GLUCOSE 146 (H) 01/06/2022 1010   GLUCOSE 73 05/07/2012 1328   CALCIUM 9.3 01/06/2022 1010   CALCIUM 8.8 05/07/2012 1328   AST 24 01/06/2022 1010   ALT 13 01/06/2022 1010   ALKPHOS 56 01/06/2022 1010   BILITOT 0.2 (L) 01/06/2022 1010   PROT 7.3 01/06/2022 1010   ALBUMIN 3.6 01/06/2022 1010    RADIOGRAPHIC STUDIES: No results found.  PERFORMANCE STATUS (ECOG) : 3 - Symptomatic, >50% confined to bed  Review of Systems Unless otherwise noted, a complete review of systems is negative.  Physical Exam General: NAD Cardiovascular: regular rate and rhythm Pulmonary: clear ant fields Abdomen: soft, nontender, + bowel sounds GU: no suprapubic tenderness Extremities: no edema, no joint deformities Skin: no rashes Neurological: Weakness but otherwise nonfocal  IMPRESSION/PLAN: Neoplasm related pain -patient is known to have widely osseous metastatic disease with significant spine involvement.  Will obtain x-rays of shoulder/humerus to rule out pathologic fracture.  Discussed with Dr. Baruch Gouty for possible XRT.  Will discontinue tramadol and rotate oxycodone for pain.  Case  and plan discussed with Dr. Rogue Bussing   Patient expressed understanding and was in agreement with this plan. He also understands that He can call clinic at any time with any questions, concerns, or complaints.   Thank you for allowing me to participate in the care of this very pleasant patient.   Time Total: 20 minutes  Visit consisted of counseling and education dealing with the complex and emotionally intense issues of symptom management in the setting of serious illness.Greater than 50%  of this time was spent counseling and coordinating care related to the above assessment and plan.  Signed by: Altha Harm, PhD, NP-C

## 2022-01-24 NOTE — Telephone Encounter (Signed)
Spoke to patient's daughter on 12/04. patient needs [facility] eval re: left leg swelling and worsening arm pain.  Please order CBC CMP PSA- Thanks-

## 2022-01-25 ENCOUNTER — Ambulatory Visit
Admission: RE | Admit: 2022-01-25 | Discharge: 2022-01-25 | Disposition: A | Discharge status: Home / Self Care | Payer: Medicare HMO | Source: Ambulatory Visit | Attending: Hospice and Palliative Medicine | Admitting: Hospice and Palliative Medicine

## 2022-01-25 ENCOUNTER — Other Ambulatory Visit: Payer: Self-pay

## 2022-01-25 ENCOUNTER — Other Ambulatory Visit (HOSPITAL_COMMUNITY): Payer: Self-pay

## 2022-01-25 ENCOUNTER — Other Ambulatory Visit: Payer: Self-pay | Admitting: Internal Medicine

## 2022-01-25 ENCOUNTER — Other Ambulatory Visit: Payer: Self-pay | Admitting: *Deleted

## 2022-01-25 DIAGNOSIS — M25511 Pain in right shoulder: Secondary | ICD-10-CM | POA: Diagnosis not present

## 2022-01-25 DIAGNOSIS — C61 Malignant neoplasm of prostate: Secondary | ICD-10-CM

## 2022-01-25 DIAGNOSIS — G893 Neoplasm related pain (acute) (chronic): Secondary | ICD-10-CM | POA: Diagnosis not present

## 2022-01-25 DIAGNOSIS — M79601 Pain in right arm: Secondary | ICD-10-CM | POA: Diagnosis not present

## 2022-01-25 NOTE — Telephone Encounter (Signed)
I spoke with Tillie Rung. Labs were not drawn at yesterday's visit.  We will need to add a lab encounter for Friday's 12/8

## 2022-01-26 ENCOUNTER — Encounter: Payer: Medicare HMO | Admitting: Hospice and Palliative Medicine

## 2022-01-26 MED FILL — Iron Sucrose Inj 20 MG/ML (Fe Equiv): INTRAVENOUS | Qty: 10 | Status: AC

## 2022-01-27 ENCOUNTER — Other Ambulatory Visit: Payer: Self-pay

## 2022-01-27 ENCOUNTER — Inpatient Hospital Stay: Payer: Medicare HMO

## 2022-01-27 VITALS — BP 109/64 | HR 73 | Temp 96.3°F | Resp 16

## 2022-01-27 DIAGNOSIS — N139 Obstructive and reflux uropathy, unspecified: Secondary | ICD-10-CM | POA: Diagnosis not present

## 2022-01-27 DIAGNOSIS — D509 Iron deficiency anemia, unspecified: Secondary | ICD-10-CM | POA: Diagnosis not present

## 2022-01-27 DIAGNOSIS — I7300 Raynaud's syndrome without gangrene: Secondary | ICD-10-CM | POA: Diagnosis not present

## 2022-01-27 DIAGNOSIS — C61 Malignant neoplasm of prostate: Secondary | ICD-10-CM

## 2022-01-27 DIAGNOSIS — E1136 Type 2 diabetes mellitus with diabetic cataract: Secondary | ICD-10-CM | POA: Diagnosis not present

## 2022-01-27 DIAGNOSIS — E119 Type 2 diabetes mellitus without complications: Secondary | ICD-10-CM

## 2022-01-27 DIAGNOSIS — M79601 Pain in right arm: Secondary | ICD-10-CM | POA: Diagnosis not present

## 2022-01-27 DIAGNOSIS — R590 Localized enlarged lymph nodes: Secondary | ICD-10-CM | POA: Diagnosis not present

## 2022-01-27 DIAGNOSIS — E1165 Type 2 diabetes mellitus with hyperglycemia: Secondary | ICD-10-CM | POA: Diagnosis not present

## 2022-01-27 DIAGNOSIS — C7951 Secondary malignant neoplasm of bone: Secondary | ICD-10-CM | POA: Diagnosis not present

## 2022-01-27 LAB — COMPREHENSIVE METABOLIC PANEL
ALT: 12 U/L (ref 0–44)
AST: 23 U/L (ref 15–41)
Albumin: 3.5 g/dL (ref 3.5–5.0)
Alkaline Phosphatase: 52 U/L (ref 38–126)
Anion gap: 7 (ref 5–15)
BUN: 17 mg/dL (ref 8–23)
CO2: 25 mmol/L (ref 22–32)
Calcium: 8.9 mg/dL (ref 8.9–10.3)
Chloride: 104 mmol/L (ref 98–111)
Creatinine, Ser: 0.91 mg/dL (ref 0.61–1.24)
GFR, Estimated: >60 mL/min (ref >60)
Glucose, Bld: 139 mg/dL — ABNORMAL HIGH (ref 70–99)
Potassium: 4.6 mmol/L (ref 3.5–5.1)
Sodium: 136 mmol/L (ref 135–145)
Total Bilirubin: 0.4 mg/dL (ref 0.3–1.2)
Total Protein: 6.8 g/dL (ref 6.5–8.1)

## 2022-01-27 LAB — CBC WITH DIFFERENTIAL/PLATELET
Abs Immature Granulocytes: 0.05 10*3/uL (ref 0.00–0.07)
Basophils Absolute: 0 10*3/uL (ref 0.0–0.1)
Basophils Relative: 1 %
Eosinophils Absolute: 0.1 10*3/uL (ref 0.0–0.5)
Eosinophils Relative: 2 %
HCT: 31.1 % — ABNORMAL LOW (ref 39.0–52.0)
Hemoglobin: 10.3 g/dL — ABNORMAL LOW (ref 13.0–17.0)
Immature Granulocytes: 1 %
Lymphocytes Relative: 15 %
Lymphs Abs: 0.6 10*3/uL — ABNORMAL LOW (ref 0.7–4.0)
MCH: 31 pg (ref 26.0–34.0)
MCHC: 33.1 g/dL (ref 30.0–36.0)
MCV: 93.7 fL (ref 80.0–100.0)
Monocytes Absolute: 0.6 10*3/uL (ref 0.1–1.0)
Monocytes Relative: 17 %
Neutro Abs: 2.4 10*3/uL (ref 1.7–7.7)
Neutrophils Relative %: 64 %
Platelets: 218 10*3/uL (ref 150–400)
RBC: 3.32 MIL/uL — ABNORMAL LOW (ref 4.22–5.81)
RDW: 16.9 % — ABNORMAL HIGH (ref 11.5–15.5)
WBC: 3.7 10*3/uL — ABNORMAL LOW (ref 4.0–10.5)
nRBC: 0 % (ref 0.0–0.2)

## 2022-01-27 LAB — PSA: Prostatic Specific Antigen: 0.78 ng/mL (ref 0.00–4.00)

## 2022-01-27 MED ORDER — SODIUM CHLORIDE 0.9 % IV SOLN
200.0000 mg | Freq: Once | INTRAVENOUS | Status: AC
  Administered 2022-01-27: 200 mg via INTRAVENOUS
  Filled 2022-01-27: qty 200

## 2022-01-27 MED ORDER — SODIUM CHLORIDE 0.9 % IV SOLN
Freq: Once | INTRAVENOUS | Status: AC
  Filled 2022-01-27: qty 250

## 2022-01-27 NOTE — Patient Instructions (Signed)

## 2022-01-30 ENCOUNTER — Encounter: Payer: Self-pay | Admitting: Internal Medicine

## 2022-01-30 ENCOUNTER — Inpatient Hospital Stay (HOSPITAL_BASED_OUTPATIENT_CLINIC_OR_DEPARTMENT_OTHER): Payer: Medicare HMO | Admitting: Hospice and Palliative Medicine

## 2022-01-30 DIAGNOSIS — G893 Neoplasm related pain (acute) (chronic): Secondary | ICD-10-CM

## 2022-01-30 LAB — HEMOGLOBIN A1C
Hgb A1c MFr Bld: 7.7 % — ABNORMAL HIGH (ref 4.8–5.6)
Mean Plasma Glucose: 174 mg/dL

## 2022-01-30 NOTE — Progress Notes (Signed)
Virtual Visit via Telephone Note  I connected with Michael [name] on 01/30/22 at  2:00 PM EST by telephone and verified that I am speaking with the correct person using two identifiers.  Location: Patient: Home Provider: Clinic   I discussed the limitations, risks, security and privacy concerns of performing an evaluation and management service by telephone and the availability of in person appointments. I also discussed with the patient that there may be a patient responsible charge related to this service. The patient expressed understanding and agreed to proceed.   History of Present Illness: Michael [name] is a 88 y.o. male with multiple medical problems including metastatic prostate cancer with invasion into the bladder and rectum status post RT left hip/lumbar area, on treatment with Xtandi/Eligard.  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  Patient is status post XRT to the spine for treatment of metastatic disease.  He has had chronic right arm pain from vertebral/epidural disease.    Observations/Objective: I spoke with patient's daughter given language barrier.  She says that patient is having still persistent pain right shoulder but now more upper back.  Back pain is likely due to known significant disease burden around T12.  No reports of overt weakness but functioning has declined due to pain.  Patient has scheduled follow-up tomorrow with radiation oncology.  In interim, I recommended medication management.  Daughter says that oxycodone seems to help but patient is not taking it often and has been reluctant to increase the frequency.  I suggested that we could try him on a low-dose steroid but that that might result in hyperglycemia.  She felt like patient would likely also declined that but plans to speak with him about options.  Assessment and Plan: Neoplasm related pain -recommend continued use of oxycodone as needed.  Could consider  low-dose prednisone.  Daughter speaking to patient about options.  Patient has follow-up tomorrow with radiation oncology.  Follow Up Instructions: As needed   I discussed the assessment and treatment plan with the patient. The patient was provided an opportunity to ask questions and all were answered. The patient agreed with the plan and demonstrated an understanding of the instructions.   The patient was advised to call back or seek an in-person evaluation if the symptoms worsen or if the condition fails to improve as anticipated.  I provided 5 minutes of non-face-to-face time during this encounter.   Irean Hong, NP

## 2022-01-30 NOTE — Telephone Encounter (Signed)
Telephone visit arranged with Merrily Pew, NP today.

## 2022-02-01 ENCOUNTER — Emergency Department (HOSPITAL_COMMUNITY): Payer: Medicare HMO

## 2022-02-01 ENCOUNTER — Other Ambulatory Visit: Payer: Self-pay

## 2022-02-01 ENCOUNTER — Observation Stay (HOSPITAL_COMMUNITY)
Admission: EM | Admit: 2022-02-01 | Discharge: 2022-02-08 | Disposition: A | Discharge status: Home Health | Payer: Medicare HMO | Attending: Emergency Medicine | Admitting: Emergency Medicine

## 2022-02-01 ENCOUNTER — Ambulatory Visit: Payer: Medicare HMO | Admitting: Radiation Oncology

## 2022-02-01 DIAGNOSIS — M545 Low back pain, unspecified: Secondary | ICD-10-CM | POA: Diagnosis not present

## 2022-02-01 DIAGNOSIS — I10 Essential (primary) hypertension: Secondary | ICD-10-CM | POA: Insufficient documentation

## 2022-02-01 DIAGNOSIS — D509 Iron deficiency anemia, unspecified: Secondary | ICD-10-CM | POA: Diagnosis not present

## 2022-02-01 DIAGNOSIS — S22080A Wedge compression fracture of T11-T12 vertebra, initial encounter for closed fracture: Secondary | ICD-10-CM | POA: Diagnosis not present

## 2022-02-01 DIAGNOSIS — C7911 Secondary malignant neoplasm of bladder: Secondary | ICD-10-CM | POA: Diagnosis not present

## 2022-02-01 DIAGNOSIS — E119 Type 2 diabetes mellitus without complications: Secondary | ICD-10-CM | POA: Diagnosis not present

## 2022-02-01 DIAGNOSIS — C7951 Secondary malignant neoplasm of bone: Secondary | ICD-10-CM | POA: Insufficient documentation

## 2022-02-01 DIAGNOSIS — N3289 Other specified disorders of bladder: Secondary | ICD-10-CM | POA: Diagnosis not present

## 2022-02-01 DIAGNOSIS — J90 Pleural effusion, not elsewhere classified: Secondary | ICD-10-CM | POA: Diagnosis not present

## 2022-02-01 DIAGNOSIS — Z7984 Long term (current) use of oral hypoglycemic drugs: Secondary | ICD-10-CM | POA: Insufficient documentation

## 2022-02-01 DIAGNOSIS — I2510 Atherosclerotic heart disease of native coronary artery without angina pectoris: Secondary | ICD-10-CM | POA: Diagnosis not present

## 2022-02-01 DIAGNOSIS — C785 Secondary malignant neoplasm of large intestine and rectum: Secondary | ICD-10-CM | POA: Insufficient documentation

## 2022-02-01 DIAGNOSIS — M4807 Spinal stenosis, lumbosacral region: Secondary | ICD-10-CM | POA: Diagnosis not present

## 2022-02-01 DIAGNOSIS — M542 Cervicalgia: Secondary | ICD-10-CM | POA: Diagnosis not present

## 2022-02-01 DIAGNOSIS — M8458XA Pathological fracture in neoplastic disease, other specified site, initial encounter for fracture: Secondary | ICD-10-CM | POA: Insufficient documentation

## 2022-02-01 DIAGNOSIS — W19XXXA Unspecified fall, initial encounter: Secondary | ICD-10-CM

## 2022-02-01 DIAGNOSIS — Y92009 Unspecified place in unspecified non-institutional (private) residence as the place of occurrence of the external cause: Secondary | ICD-10-CM | POA: Insufficient documentation

## 2022-02-01 DIAGNOSIS — Z043 Encounter for examination and observation following other accident: Secondary | ICD-10-CM | POA: Diagnosis not present

## 2022-02-01 DIAGNOSIS — S12600A Unspecified displaced fracture of seventh cervical vertebra, initial encounter for closed fracture: Principal | ICD-10-CM | POA: Diagnosis present

## 2022-02-01 DIAGNOSIS — I6782 Cerebral ischemia: Secondary | ICD-10-CM | POA: Diagnosis not present

## 2022-02-01 DIAGNOSIS — M25519 Pain in unspecified shoulder: Secondary | ICD-10-CM | POA: Diagnosis not present

## 2022-02-01 DIAGNOSIS — C61 Malignant neoplasm of prostate: Secondary | ICD-10-CM

## 2022-02-01 DIAGNOSIS — W109XXA Fall (on) (from) unspecified stairs and steps, initial encounter: Secondary | ICD-10-CM | POA: Insufficient documentation

## 2022-02-01 DIAGNOSIS — S0219XA Other fracture of base of skull, initial encounter for closed fracture: Secondary | ICD-10-CM | POA: Diagnosis not present

## 2022-02-01 LAB — CBC WITH DIFFERENTIAL/PLATELET
Abs Immature Granulocytes: 0.06 10*3/uL (ref 0.00–0.07)
Basophils Absolute: 0 10*3/uL (ref 0.0–0.1)
Basophils Relative: 1 %
Eosinophils Absolute: 0.1 10*3/uL (ref 0.0–0.5)
Eosinophils Relative: 1 %
HCT: 30.3 % — ABNORMAL LOW (ref 39.0–52.0)
Hemoglobin: 9.7 g/dL — ABNORMAL LOW (ref 13.0–17.0)
Immature Granulocytes: 1 %
Lymphocytes Relative: 15 %
Lymphs Abs: 0.9 10*3/uL (ref 0.7–4.0)
MCH: 31.2 pg (ref 26.0–34.0)
MCHC: 32 g/dL (ref 30.0–36.0)
MCV: 97.4 fL (ref 80.0–100.0)
Monocytes Absolute: 0.8 10*3/uL (ref 0.1–1.0)
Monocytes Relative: 14 %
Neutro Abs: 3.9 10*3/uL (ref 1.7–7.7)
Neutrophils Relative %: 68 %
Platelets: 224 10*3/uL (ref 150–400)
RBC: 3.11 MIL/uL — ABNORMAL LOW (ref 4.22–5.81)
RDW: 17 % — ABNORMAL HIGH (ref 11.5–15.5)
WBC: 5.7 10*3/uL (ref 4.0–10.5)
nRBC: 0 % (ref 0.0–0.2)

## 2022-02-01 LAB — I-STAT CHEM 8, ED
BUN: 15 mg/dL (ref 8–23)
Calcium, Ion: 1.12 mmol/L — ABNORMAL LOW (ref 1.15–1.40)
Chloride: 100 mmol/L (ref 98–111)
Creatinine, Ser: 0.9 mg/dL (ref 0.61–1.24)
Glucose, Bld: 175 mg/dL — ABNORMAL HIGH (ref 70–99)
HCT: 29 % — ABNORMAL LOW (ref 39.0–52.0)
Hemoglobin: 9.9 g/dL — ABNORMAL LOW (ref 13.0–17.0)
Potassium: 4.1 mmol/L (ref 3.5–5.1)
Sodium: 134 mmol/L — ABNORMAL LOW (ref 135–145)
TCO2: 24 mmol/L (ref 22–32)

## 2022-02-01 LAB — GLUCOSE, CAPILLARY
Glucose-Capillary: 176 mg/dL — ABNORMAL HIGH (ref 70–99)
Glucose-Capillary: 142 mg/dL — ABNORMAL HIGH (ref 70–99)

## 2022-02-01 LAB — BASIC METABOLIC PANEL
Anion gap: 11 (ref 5–15)
BUN: 12 mg/dL (ref 8–23)
CO2: 22 mmol/L (ref 22–32)
Calcium: 8.8 mg/dL — ABNORMAL LOW (ref 8.9–10.3)
Chloride: 99 mmol/L (ref 98–111)
Creatinine, Ser: 1.03 mg/dL (ref 0.61–1.24)
GFR, Estimated: >60 mL/min (ref >60)
Glucose, Bld: 173 mg/dL — ABNORMAL HIGH (ref 70–99)
Potassium: 4 mmol/L (ref 3.5–5.1)
Sodium: 132 mmol/L — ABNORMAL LOW (ref 135–145)

## 2022-02-01 MED ORDER — PANTOPRAZOLE SODIUM 40 MG PO TBEC
40.0000 mg | DELAYED_RELEASE_TABLET | Freq: Every day | ORAL | Status: DC
  Administered 2022-02-01 – 2022-02-08 (×8): 40 mg via ORAL
  Filled 2022-02-01 (×8): qty 1

## 2022-02-01 MED ORDER — IOHEXOL 350 MG/ML SOLN
75.0000 mL | Freq: Once | INTRAVENOUS | Status: AC | PRN
  Administered 2022-02-01: 75 mL via INTRAVENOUS

## 2022-02-01 MED ORDER — ENOXAPARIN SODIUM 40 MG/0.4ML IJ SOSY
40.0000 mg | PREFILLED_SYRINGE | INTRAMUSCULAR | Status: DC
  Administered 2022-02-01 – 2022-02-08 (×8): 40 mg via SUBCUTANEOUS
  Filled 2022-02-01 (×8): qty 0.4

## 2022-02-01 MED ORDER — ADULT MULTIVITAMIN W/MINERALS CH
1.0000 | ORAL_TABLET | Freq: Every day | ORAL | Status: DC
  Administered 2022-02-02 – 2022-02-08 (×7): 1 via ORAL
  Filled 2022-02-01 (×7): qty 1

## 2022-02-01 MED ORDER — OXYCODONE-ACETAMINOPHEN 5-325 MG PO TABS: 1.0000 | ORAL_TABLET | Freq: Once | ORAL | Status: DC

## 2022-02-01 MED ORDER — IBUPROFEN 200 MG PO TABS: 200.0000 mg | ORAL_TABLET | Freq: Four times a day (QID) | ORAL | Status: DC | PRN

## 2022-02-01 MED ORDER — METOPROLOL SUCCINATE ER 25 MG PO TB24
25.0000 mg | ORAL_TABLET | Freq: Every day | ORAL | Status: DC
  Administered 2022-02-01 – 2022-02-08 (×8): 25 mg via ORAL
  Filled 2022-02-01 (×8): qty 1

## 2022-02-01 MED ORDER — SENNOSIDES-DOCUSATE SODIUM 8.6-50 MG PO TABS: 1.0000 | ORAL_TABLET | Freq: Every evening | ORAL | Status: DC | PRN

## 2022-02-01 MED ORDER — OXYCODONE HCL 5 MG PO TABS
5.0000 mg | ORAL_TABLET | Freq: Four times a day (QID) | ORAL | Status: DC
  Administered 2022-02-01 – 2022-02-02 (×3): 5 mg via ORAL
  Filled 2022-02-01 (×3): qty 1

## 2022-02-01 MED ORDER — POLYSACCHARIDE IRON COMPLEX 150 MG PO CAPS
150.0000 mg | ORAL_CAPSULE | Freq: Every day | ORAL | Status: DC
  Administered 2022-02-02 – 2022-02-08 (×7): 150 mg via ORAL
  Filled 2022-02-01 (×8): qty 1

## 2022-02-01 MED ORDER — ONDANSETRON HCL 4 MG/2ML IJ SOLN
4.0000 mg | Freq: Once | INTRAMUSCULAR | Status: AC
  Administered 2022-02-01: 4 mg via INTRAVENOUS
  Filled 2022-02-01: qty 2

## 2022-02-01 MED ORDER — INSULIN ASPART 100 UNIT/ML IJ SOLN
0.0000 [IU] | Freq: Three times a day (TID) | INTRAMUSCULAR | Status: DC
  Administered 2022-02-01 – 2022-02-02 (×4): 3 [IU] via SUBCUTANEOUS
  Administered 2022-02-03: 8 [IU] via SUBCUTANEOUS
  Administered 2022-02-03 (×2): 5 [IU] via SUBCUTANEOUS
  Administered 2022-02-04 (×2): 8 [IU] via SUBCUTANEOUS
  Administered 2022-02-04: 2 [IU] via SUBCUTANEOUS
  Administered 2022-02-05 – 2022-02-06 (×4): 5 [IU] via SUBCUTANEOUS
  Administered 2022-02-06: 11 [IU] via SUBCUTANEOUS
  Administered 2022-02-06: 8 [IU] via SUBCUTANEOUS
  Administered 2022-02-07: 3 [IU] via SUBCUTANEOUS
  Administered 2022-02-07 – 2022-02-08 (×2): 5 [IU] via SUBCUTANEOUS
  Administered 2022-02-08: 3 [IU] via SUBCUTANEOUS

## 2022-02-01 MED ORDER — BISACODYL 5 MG PO TBEC: 5.0000 mg | DELAYED_RELEASE_TABLET | Freq: Every day | ORAL | Status: DC | PRN

## 2022-02-01 MED ORDER — NIFEDIPINE ER OSMOTIC RELEASE 30 MG PO TB24
30.0000 mg | ORAL_TABLET | Freq: Every day | ORAL | Status: DC
  Administered 2022-02-01 – 2022-02-08 (×8): 30 mg via ORAL
  Filled 2022-02-01 (×9): qty 1

## 2022-02-01 MED ORDER — OXYCODONE HCL 5 MG PO TABS: 5.0000 mg | ORAL_TABLET | ORAL | Status: DC | PRN

## 2022-02-01 MED ORDER — ACETAMINOPHEN 500 MG PO TABS
500.0000 mg | ORAL_TABLET | ORAL | Status: DC | PRN
  Administered 2022-02-02: 500 mg via ORAL
  Filled 2022-02-01: qty 1

## 2022-02-01 MED ORDER — MIRABEGRON ER 25 MG PO TB24
25.0000 mg | ORAL_TABLET | Freq: Every day | ORAL | Status: DC
  Administered 2022-02-01 – 2022-02-08 (×8): 25 mg via ORAL
  Filled 2022-02-01 (×9): qty 1

## 2022-02-01 MED ORDER — ATORVASTATIN CALCIUM 10 MG PO TABS
10.0000 mg | ORAL_TABLET | Freq: Every day | ORAL | Status: DC
  Administered 2022-02-02 – 2022-02-07 (×6): 10 mg via ORAL
  Filled 2022-02-01 (×7): qty 1

## 2022-02-01 MED ORDER — MORPHINE SULFATE (PF) 4 MG/ML IV SOLN
4.0000 mg | Freq: Once | INTRAVENOUS | Status: AC
  Administered 2022-02-01: 4 mg via INTRAVENOUS
  Filled 2022-02-01: qty 1

## 2022-02-01 MED ORDER — NON FORMULARY: 40.0000 mg | Freq: Every day | Status: DC

## 2022-02-01 MED ORDER — CALCIUM CARB-CHOLECALCIFEROL 600-20 MG-MCG PO TABS: 1.0000 | ORAL_TABLET | Freq: Every evening | ORAL | Status: DC

## 2022-02-01 MED ORDER — INSULIN ASPART 100 UNIT/ML IJ SOLN: 0.0000 [IU] | Freq: Every day | INTRAMUSCULAR | Status: DC

## 2022-02-01 NOTE — ED Notes (Signed)
X-ray at bedside

## 2022-02-01 NOTE — ED Notes (Signed)
Patient c/o left neck pain and screaming out "noooooooooooooooo" when trying to ambulate. MD made aware

## 2022-02-01 NOTE — Hospital Course (Addendum)
  Michael [name] is a 88 y.o. male with a past medical history of metastatic prostate cancer who presented to the emergency room after a fall. Patient found to have acute C7 fracture.  Patient admitted for further evaluation and management.   #Acute C7 fracture secondary to mechanical fall Patient initially presented after a fall.  Initial imaging showed patient had acute C7 spine fracture.  Neurosurgery did evaluate, and did not intervene.  They recommended to keep patient in a c-collar for the next 3 to 4 weeks and follow-up outpatient.  Patient's pain if she was due to pain, and pain regimen was optimized with OxyContin 10 mg twice daily patient also had Tylenol 1000 mg scheduled.  Patient also was given Voltaren gel for pain as well.   Patient was evaluated for PT and SNF was recommended in which patient will be placed at SNF.   #Metastatic prostate cancer Patient diagnosed with prostate cancer in 2018.  He has metastatic disease to the bladder, rectum, left hip, lumbar spine, thoracic spine, and left shoulder.  There is also some pathologic fractures noted to left ribs.  Patient currently on leuprorelin and Enzalutamide.  After long discussions with daughter, daughter is thinking about going towards hospice, and revealing diagnosis and prognosis to patient.  Patient did see palliative care here, and patient's daughter would like patient to get stronger before going towards hospice.  Hospice information given to daughter. Patient's daughter would like patient to get stronger, and patient is agreeable to going to SNF.  SNF placement is still being assessed.  Until then we will plan for good pain control.  Oncologist is aware of all discussions.     #Iron deficiency anemia Patient's iron deficit from September is 931.  Patient's sleep study showing decreased iron saturations and decreased iron.  Patient does have elevated ferritin.  Will continue with iron supplements.  Patient received iron  infusion during hospitalization.  Patient did not require any transfusion during hospitalization.  #Type 2 diabetes mellitus Patient currently on sliding scale insulin.  Holding home glimepiride, metformin, and pioglitazone.  Patient's home medications.  Glucose monitoring well.  Patient continue with sliding scale insulin during hospitalization.   #Raynaud Disease Patient continued on nifedipine during hospitalization chronic, patient is taking Nifedipine '30mg'$  daily.   #Hyperlipidemia Patient continued home Lipitor 10 mg daily during hospitalization.   #CAD Patient continue home metoprolol succinate 25 mg daily during hospitalization.   #BPH Patient continued mirabegron ER 25 during admission.

## 2022-02-01 NOTE — H&P (Signed)
Date: 02/01/2022               Patient Name:  Michael [name] MRN: [id number]  DOB: May 28, 1933 Age / Sex: 88 y.o., male   PCP: [name], MD              Medical Service: Internal Medicine Teaching Service              Attending Physician: Dr. Charise Killian, MD    First Contact: Leonette Nutting, MS 3 Pager: [phone]  Second Contact: Dr. Jodell Cipro  Pager: [phone]  Third Contact Dr. Humphrey Rolls Pager: [phone]       After Hours (After 5p/  First Contact Pager: [phone]  weekends / holidays): Second Contact Pager: [phone]   Chief Complaint: Fall  History of Present Illness: Daughter and son-in-law assisted with translation and were also present for the initial fall.  Michael [name] is a 88 y.o. 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 He was trying to put on new socks on the 2nd floor of his home when he slipped and fell down 20 stairs. His son-in-law witnessed the fall. Patient did not have alterations in mental status prior to the fall,  dizziness, or lightheadedness. After the fall, patient was alert and oriented without loss of consciousness. He was unable to get up after the fall. He has no history of falls. His new medications include oxycodone '5mg'$  q4hr PRN which was started on 12/11.  Today, he complains of new pain in his bilateral neck and shoulder, and left chest/axilla area. He denies headache, SOB, vision changes, and weakness. He endorses pleuritic chest pain localized to his left chest/axilla area. He is unable to cough or sneeze without pain. Patient has no code status or advance care plan documented. Family reports there is a cultural barrier to discussing code status and end-of-life. After extensive discussion with patient, he reported DNR status.   In the ED, patient was afebrile, normotensive,  and breathing comfortably on room air. Patient was given IV morphine '4mg'$  x2 and zofran '4mg'$ . Neurosurgery was consulted and recommended Aspen/[place] J collar x3-4 weeks with outpatient follow-up. EKG was sinus rhythm. H/H 9.7/30.3> 9.9/29 . UA pending. BMP normal. Cr. 1.03.  Imaging in the ED: -CT head with no intracranial injury -CT cervical spine with acute C7 fracture and known metastatic epidural tumor -CT lumbar and thoracic spine with diffuse metastatic disease in thoracic and lumbar spine, with no acute fracture -CT chest, abdomen, and pelvis with fracture of the left posterior 6th rib, diffuse bladder wall thickening, sclerotic bone lesions c/w metastatic disease, and small pleural effusions bilaterally -XR left shoulder showing mets    Meds:  Current Meds  Medication Sig   acetaminophen (TYLENOL) 500 MG tablet Take 500 mg by mouth every 4 (four) hours as needed for moderate pain or fever.   atorvastatin (LIPITOR) 10 MG tablet Take 10 mg by mouth daily at 6 PM.    Calcium Carb-Cholecalciferol (CALCIUM 600+D3) 600-800 MG-UNIT TABS Take 1 tablet by mouth every evening.    enzalutamide (XTANDI) 40 MG tablet Take 2 tablets (80 mg total) by mouth daily.   glimepiride (AMARYL) 2 MG tablet Take 2 mg by mouth daily with breakfast.   ibuprofen (ADVIL,MOTRIN) 200 MG tablet Take 200 mg by mouth every 6 (six) hours as needed for fever or moderate pain.  iron polysaccharides (NIFEREX) 150 MG capsule Take 150 mg by mouth daily.    metFORMIN (GLUCOPHAGE) 1000 MG tablet Take 1,000 mg by mouth 2 (two) times daily.   metoprolol succinate (TOPROL-XL) 25 MG 24 hr tablet TAKE 1 TABLET BY MOUTH EVERY DAY FOR BLOOD PRESSURE (Patient taking differently: Take 25 mg by mouth daily.)   Multiple Vitamins-Minerals (MULTIVITAMIN WITH MINERALS) tablet Take 1 tablet by mouth daily.    MYRBETRIQ 25 MG TB24 tablet TAKE 1 TABLET (25 MG TOTAL) BY MOUTH DAILY.   NIFEdipine (ADALAT CC) 30 MG 24 hr tablet Take 30 mg by  mouth in the morning and at bedtime.   omeprazole (PRILOSEC) 20 MG capsule TAKE ONE CAPSULE BY MOUTH EVERY DAY (Patient taking differently: Take 20 mg by mouth daily.)   oxyCODONE (OXY IR/ROXICODONE) 5 MG immediate release tablet Take 1 tablet (5 mg total) by mouth every 4 (four) hours as needed for severe pain.   pioglitazone (ACTOS) 30 MG tablet Take 30 mg by mouth daily.    Allergies: Allergies as of 02/01/2022   (No Known Allergies)   Past Medical History:  Diagnosis Date   Anemia    iron deficiency   Aortic atherosclerosis (HCC)    Bilateral carotid artery disease (HCC)    Mild plaque formation without obstructive disease noted on carotid Doppler.   Diabetes mellitus without complication ([place])    Essential hypertension    GERD (gastroesophageal reflux disease)    Hyperlipidemia    Hypertension    Left carotid artery stenosis    Nonobstructive Coronary artery disease    a. Previous cardiac catheterization at [facility] in 2010. The patient was told about 2 blockages which did not require revascularization; b. 01/2015 Cath: LM nl, LAD 10p/m, 35md, D2 30ost, LCX nl, RCA min irregs, EF 55-65%; b. 12/2018 MV: No ischemia/infarct. CT images w/ mod 3 vessel Cor Ca2+ and mild-mod Ao atherosclerosis.   [place]   Raynaud disease    a. Managed w/ nifedipine.    Family History:  No known family history of heart disease and cancer. He has a sibling who passed from an unknown cancer.  Social History:  He lives at home with his daughter, son-in-law, and grandson in [place] He depends on his daughter for some ADLs such as meals and driving to appointments but he manages his medications and ambulates with a walker as needed at home. He was more independent prior to recent hospitalization in September where he was found to have ESBL in urine. He has indwelling catheter which was last changed on 11/30. He does not drink alcohol or smoke cigarettes or use illicit drugs. He has  no past history of drug use. His PCP is Dr. VJeanett Schlein  Review of Systems: A complete ROS was negative except as per HPI.   Physical Exam: Blood pressure (!) 142/79, pulse (!) 103, temperature 97.7 F (36.5 C), temperature source Oral, resp. rate 18, height '5\' 6"'$  (1.676 m), weight 54.4 kg, SpO2 97 %. General: Thin-appearing male sitting in bed, interactive, cooperative. HEENT: Atraumatic. Normocephalic. Wearing C collar. Chest: Normal rate. No murmurs, rubs, or gallops. Tenderness to palpation of left upper chest/axilla area. Pulm: Normal work of breathing. No wheezing, crackles, or rales. Skin: No bruising noted on abdomen, chest, or legs. MSK: Shoulder abduction to 170 degrees. Mildly tender to palpation to bilateral shoulders. Extr: Cap refill 3-4 seconds. Good dorsalis pedal pulses bilaterally. No ulcers bilaterally.  Assessment & Plan by Problem: Michael [name]  is a 88 y.o. with a past medical history of metastatic prostate cancer to the bladder, rectum, thoracic spine, epidural spine (T6, T10) and lumbar spine, hypertension, hyperlipidemia, type 2 diabetes, and Raynaud's who presents with a mechanical fall this morning with C7 fracture.  Mechanical Fall C7 fracture Left posterior 6th rib fracture Patient with no history of falls presenting with fall. No clear prodrome before fall, no loss of consciousness, patient denies hitting his head.  He denied any vertigo, and was not able to endorse any specific etiology for the fall.  New medication of oxycodone '5mg'$  q4hr PRN can contribute, though he declined dizziness or drowsiness before the fall.  Initial imaging showing acute C7 fracture, remaining osseous findings very likely due to metastasis as below.  Neurosurgery consulted and recommended c-collar in place for 3 to 4 weeks until outpatient clinic follow-up. -Neurosurgery following and recommends: continue C collar x3-4 weeks and follow-up outpatient with neurosurgery -Pain  control: Tylenol 650 mg q6hrs and Oxycodone '5mg'$  q6hrs or '10mg'$  for pain score >5, can escalate pain regimen if needed. -PT/OT consult  Metastatic Prostate Cancer Patient was diagnosed with prostate cancer in 2018. He has metastatic disease to the bladder, rectum, left hip, lumbar and thoracic area including T12 pathologic fracture, and left shoulder. He was supposed to begin radiation 12/13, but his oncologist was made aware of his hospital admission. He is on Leuprorelin injections and Enzalutamide '40mg'$  BID. 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  He is status post XRT to spine for treatment of metastatic disease.  Currently meets with outpatient palliative care. His home pain management includes Tylenol '500mg'$  q4hr PRN, Ibuprofen '200mg'$  q6hr PRN, and Oxycodone '5mg'$  q4hr PRN.  Family is amenable to inpatient palliative care consult to discuss ACP and goals of care. -Palliative care consult -Pain management as above -Oncologist Dr. Rogue Bussing is aware, appreciate recommendations  T2DM Patient's last A1C was 7.7 (01/27/22). His home medications are Glimepiride '2mg'$  daily, Metformin '1000mg'$  daily, and Pioglitazone '30mg'$  daily. -SSI while inpatient -CBG  Raynaud Disease Patient is taking Nifedipine '30mg'$  daily. -Continue home medications  Hyperlipidemia Patient is taking Lipitor '10mg'$  daily. -Continue home medications  CAD Patient is taking Metoprolol Succinate '25mg'$  daily. -Continue home medications  BPH Patient is taking Mirabegron ER '25mg'$  daily. -Continue home medications   Dispo: Admit patient to Observation with expected length of stay less than 2 midnights.  Signed: Leonette Nutting, Medical Student 02/01/2022, 5:14 PM  Pager: '@[phone]'$ @    I have seen and examined the patient myself, and I have reviewed the note by Leonette Nutting, MS 3 and was present during the interview and physical exam.   Briefly, 88 year old with cancer with extensive  metastasis, first diagnosed about 6 years ago s/p chemo, XRT to the spine, supposed start radiation treatment today presenting with mechanical fall and C7 fracture in addition to multiple pathological fractures due to osseous mets.  He has been evaluated by neurosurgery and is supposed to have outpatient clinic follow-up with c-collar in place.  Palliative care and his oncologist are on board. Will work on pain management, PT/OT, and continued goals of care discussions.  Signed: Linus Galas, MD 02/01/2022, 6:19 PM

## 2022-02-01 NOTE — Progress Notes (Signed)
I tried to reach the patient's daughter twice.  Unable to reach left a voicemail.  Patient had unfortunate fall.  Appreciate evaluation and treatment plan at [facility].   GB

## 2022-02-01 NOTE — ED Notes (Signed)
Trauma Response Nurse Documentation  Michael [name] is a 88 y.o. male arriving to [facility] ED via [facility].  Trauma was activated as a Level 2 based on the following trauma criteria Falls > 20 ft. with adults, >10 ft. with children (<15). Trauma team at the bedside on patient arrival.   Patient cleared for CT by Dr. Laverta Baltimore. Pt transported to CT with trauma response nurse present to monitor. RN remained with the patient throughout their absence from the department for clinical observation.   GCS 15.  History   Past Medical History:  Diagnosis Date   Anemia    iron deficiency   Aortic atherosclerosis (HCC)    Bilateral carotid artery disease (HCC)    Mild plaque formation without obstructive disease noted on carotid Doppler.   Diabetes mellitus without complication ([place])    Essential hypertension    GERD (gastroesophageal reflux disease)    Hyperlipidemia    Hypertension    Left carotid artery stenosis    Nonobstructive Coronary artery disease    a. Previous cardiac catheterization at [facility] in 2010. The patient was told about 2 blockages which did not require revascularization; b. 01/2015 Cath: LM nl, LAD 10p/m, 79md, D2 30ost, LCX nl, RCA min irregs, EF 55-65%; b. 12/2018 MV: No ischemia/infarct. CT images w/ mod 3 vessel Cor Ca2+ and mild-mod Ao atherosclerosis.   [place]   Raynaud disease    a. Managed w/ nifedipine.     Past Surgical History:  Procedure Laterality Date   CARDIAC CATHETERIZATION  2010   CARDIAC CATHETERIZATION N/A 02/03/2015   Procedure: Left Heart Cath and Coronary Angiography;  Surgeon: MWellington [name] MD;  [facility];  Service: Cardiovascular;  Laterality: N/A;   CATARACT EXTRACTION Bilateral    CYSTOSCOPY W/ RETROGRADES Bilateral 04/11/2017   Procedure: CYSTOSCOPY WITH RETROGRADE PYELOGRAM;  Surgeon: BHollice Espy MD;  Location: [facility] ORS;  Service: Urology;  Laterality: Bilateral;   CYSTOSCOPY W/  RETROGRADES Bilateral 09/12/2017   Procedure: CYSTOSCOPY WITH RETROGRADE PYELOGRAM;  Surgeon: BHollice Espy MD;  Location: [facility] ORS;  Service: Urology;  Laterality: Bilateral;   CYSTOSCOPY W/ RETROGRADES Bilateral 02/25/2018   Procedure: CYSTOSCOPY WITH RETROGRADE PYELOGRAM;  Surgeon: BHollice Espy MD;  Location: [facility] ORS;  Service: Urology;  Laterality: Bilateral;   CYSTOSCOPY W/ URETERAL STENT PLACEMENT Bilateral 09/12/2017   Procedure: CYSTOSCOPY WITH STENT REPLACEMENT;  Surgeon: BHollice Espy MD;  Location: [facility] ORS;  Service: Urology;  Laterality: Bilateral;   CYSTOSCOPY W/ URETERAL STENT REMOVAL Bilateral 02/25/2018   Procedure: CYSTOSCOPY WITH STENT REMOVAL;  Surgeon: BHollice Espy MD;  Location: [facility] ORS;  Service: Urology;  Laterality: Bilateral;   CYSTOSCOPY WITH STENT PLACEMENT Left 04/11/2017   Procedure: CYSTOSCOPY WITH STENT PLACEMENT and fulgeration;  Surgeon: BHollice Espy MD;  Location: [facility] ORS;  Service: Urology;  Laterality: Left;   CYSTOSCOPY WITH URETHRAL DILATATION Bilateral 04/11/2017   Procedure: CYSTOSCOPY WITH URETHRAL DILATATION;  Surgeon: BHollice Espy MD;  Location: [facility] ORS;  Service: Urology;  Laterality: Bilateral;   IR CONVERT RIGHT NEPHROSTOMY TO NEPHROURETERAL CATH  05/21/2017   IR NEPHROSTOMY PLACEMENT RIGHT  04/13/2017     Initial Focused Assessment (If applicable, or please see trauma documentation): Patient A&Ox4, GCS 15, PERR 3 Per family patient was putting on socks, fell over and ended up falling down 20 stairs Breathing intact, equal No obvious trauma, complaints neck tenderness and shoulder pain  CT's Completed:   CT Head and CT C-Spine   Interventions:  IV, labs CXR/PXR CT Head/Cspine  Plan for disposition:  Admission to floor   Event Summary: Patient to ED after falling down 20 stairs. Patient complaints of shoulder and neck pain. Imaging revealed a C7 fx, patient placed in Aspen collar. Family at bedside. Attempts to ambulate  unsuccessful, patient to be admitted for pain control.   Bedside handoff with ED RN Herbert Spires.    Park Pope Djibril Glogowski  Trauma Response RN  Please call TRN at [phone] for further assistance.

## 2022-02-01 NOTE — ED Triage Notes (Signed)
Pt BIB [facility] as a fall, activated level 2 trauma. Pt was putting on socks and fell down 20 steps. Pt is not on any blood thinners. Pt is axox4 on arrival. Denies LOC. Pt arrives in c-collar and complains of neck pain. VSS.

## 2022-02-01 NOTE — ED Provider Notes (Signed)
[facility] EMERGENCY DEPARTMENT Provider Note   CSN: [id number] Arrival date & time: 02/01/22  4401     History  Chief Complaint  Patient presents with   Michael [name]    Michael [name] is a 88 y.o. male. With past medical history of hypertension, CAD, T2DM, metastatic prostate cancer who presents with indwelling foley catheter who presents as a Level 2 trauma for fall.  Per EMS patient was at the top of a staircase when he slipped and fell down 20 stairs. No reported LOC. Not anticoagulated.  On arrival, patient daughter and son present. Son was present at the time of fall. They both help to translate for the patient. He was at the top of the stairs this morning when he slipped and fell down about 20 stairs. Patient and son deny him striking his head or loss of consciousness. The son adds that he was using new socks today. Deny anticoagulation use. The patient states that his neck and back are hurting. He denies chest or abdominal pain. Denies pain to the extremities. The daughter adds he has an indwelling foley after it was inserted for his prostate cancer. Was to start radiation today. On Xtandi/Eligard for his prostate cancer.   On chart review, appears he has metastatic spread to the bladder, rectum, thoracic spine, epidural tumor at T6 and T10, lumbar spine.    Fall       Home Medications Prior to Admission medications   Medication Sig Start Date End Date Taking? Authorizing Provider  acetaminophen (TYLENOL) 500 MG tablet Take 500 mg by mouth every 4 (four) hours as needed for moderate pain or fever.    [provider]  atorvastatin (LIPITOR) 10 MG tablet Take 10 mg by mouth daily at 6 PM.     [provider]  Calcium Carb-Cholecalciferol (CALCIUM 600+D3) 600-800 MG-UNIT TABS Take 1 tablet by mouth every evening.     [provider]  COMBIVENT RESPIMAT 20-100 MCG/ACT AERS respimat Inhale 1 puff into the lungs 4 (four) times daily.  11/23/21   [provider]  enzalutamide Gillermina Phy) 40 MG tablet Take 2 tablets (80 mg total) by mouth daily. 10/31/21   [name], MD  glimepiride (AMARYL) 2 MG tablet Take 2 mg by mouth daily with breakfast.    [provider]  glucose blood (ACCU-CHEK GUIDE) test strip Use asdirected three times a day diag E11.65 04/19/17   [name], MD  ibuprofen (ADVIL,MOTRIN) 200 MG tablet Take 200 mg by mouth every 6 (six) hours as needed for fever or moderate pain.     [provider]  iron polysaccharides (NIFEREX) 150 MG capsule Take 150 mg by mouth daily.  12/28/17 01/03/23  [provider]  metFORMIN (GLUCOPHAGE) 1000 MG tablet Take 1,000 mg by mouth 2 (two) times daily. 12/07/19   [provider]  metoprolol succinate (TOPROL-XL) 25 MG 24 hr tablet TAKE 1 TABLET BY MOUTH EVERY DAY FOR BLOOD PRESSURE 11/09/17   [name], MD  Multiple Vitamins-Minerals (MULTIVITAMIN WITH MINERALS) tablet Take 1 tablet by mouth daily.     [provider]  MYRBETRIQ 25 MG TB24 tablet TAKE 1 TABLET (25 MG TOTAL) BY MOUTH DAILY. 08/01/21   [name], MD  omeprazole (PRILOSEC) 20 MG capsule TAKE ONE CAPSULE BY MOUTH EVERY DAY 07/02/17   [name], MD  oxyCODONE (OXY IR/ROXICODONE) 5 MG immediate release tablet Take 1 tablet (5 mg total) by mouth every 4 (four) hours  as needed for severe pain. 01/24/22   Borders, Kirt Boys, NP  pioglitazone (ACTOS) 30 MG tablet Take 30 mg by mouth daily. 11/04/21   [provider]      Allergies    Patient has no known allergies.    Review of Systems   Review of Systems  Musculoskeletal:  Positive for back pain and neck pain.  All other systems reviewed and are negative.   Physical Exam Updated Vital Signs BP 136/80   Pulse (!) 101   Temp (!) 97.2 F (36.2 C) (Oral)   Resp 20   Ht '5\' 6"'$  (1.676 m)   Wt 54.4 kg   SpO2 99%   BMI 19.37 kg/m  Physical Exam Vitals and nursing note reviewed.   Constitutional:      General: He is in acute distress.     Appearance: Normal appearance. He is normal weight. He is not ill-appearing or toxic-appearing.  HENT:     Head: Normocephalic and atraumatic.     Mouth/Throat:     Mouth: Mucous membranes are dry.     Pharynx: Oropharynx is clear.  Eyes:     General: No scleral icterus.    Extraocular Movements: Extraocular movements intact.     Pupils: Pupils are equal, round, and reactive to light.  Neck:     Comments: C-collar in place on arrival  Cardiovascular:     Rate and Rhythm: Normal rate and regular rhythm.     Pulses: Normal pulses.     Heart sounds: No murmur heard. Pulmonary:     Effort: Pulmonary effort is normal. No respiratory distress.     Breath sounds: Normal breath sounds. No wheezing, rhonchi or rales.  Chest:     Chest wall: No tenderness.  Abdominal:     General: Abdomen is flat. Bowel sounds are normal. There is no distension.     Palpations: Abdomen is soft.     Tenderness: There is no abdominal tenderness. There is no guarding.    Musculoskeletal:        General: No deformity or signs of injury. Normal range of motion.     Cervical back: Tenderness present. Spinous process tenderness present.     Comments: Log rolled with c-spine precautions. Tenderness to the C,T,L spine without obvious step offs or deformities present. No wounds to the back.  Moves all extremities wells. No obvious swelling, deformity, bruising or tenderness.  Pelvis stable without tenderness.    Skin:    General: Skin is warm and dry.     Capillary Refill: Capillary refill takes less than 2 seconds.  Neurological:     General: No focal deficit present.     Mental Status: He is alert and oriented to person, place, and time. Mental status is at baseline.  Psychiatric:        Mood and Affect: Mood normal.        Behavior: Behavior normal.        Thought Content: Thought content normal.        Judgment: Judgment normal.     ED  Results / Procedures / Treatments   Labs (all labs ordered are listed, but only abnormal results are displayed) Labs Reviewed  BASIC METABOLIC PANEL - Abnormal; Notable for the following components:      Result Value   Sodium 132 (*)    Glucose, Bld 173 (*)    Calcium 8.8 (*)    All other components within normal limits  CBC WITH DIFFERENTIAL/PLATELET -  Abnormal; Notable for the following components:   RBC 3.11 (*)    Hemoglobin 9.7 (*)    HCT 30.3 (*)    RDW 17.0 (*)    All other components within normal limits  I-STAT CHEM 8, ED - Abnormal; Notable for the following components:   Sodium 134 (*)    Glucose, Bld 175 (*)    Calcium, Ion 1.12 (*)    Hemoglobin 9.9 (*)    HCT 29.0 (*)    All other components within normal limits  URINALYSIS, ROUTINE W REFLEX MICROSCOPIC    EKG EKG Interpretation  Date/Time:  Wednesday February 01 2022 08:53:50 EST Ventricular Rate:  91 PR Interval:  133 QRS Duration: 94 QT Interval:  385 QTC Calculation: 474 R Axis:   60 Text Interpretation: Sinus rhythm Similar to prior tracing Confirmed by Nanda Quinton [phone]) on 02/01/2022 10:07:50 AM  Radiology CT T-SPINE NO CHARGE  Result Date: 02/01/2022 CLINICAL DATA:  Back trauma, no prior imaging (Age >= 16y) EXAM: CT THORACIC AND LUMBAR SPINE WITHOUT CONTRAST TECHNIQUE: Multidetector CT imaging of the thoracic and lumbar spine was performed without contrast. Multiplanar CT image reconstructions were also generated. RADIATION DOSE REDUCTION: This exam was performed according to the departmental dose-optimization program which includes automated exposure control, adjustment of the mA and/or kV according to patient size and/or use of iterative reconstruction technique. COMPARISON:  MRI 12/04/2021, CT 11/18/2021 FINDINGS: CT THORACIC SPINE FINDINGS Alignment: Physiologic. Vertebrae: There is diffuse osseous metastatic disease throughout the thoracic spine and visualized ribs. Unchanged pathologic  fracture of the left posterior sixth rib. There is an acute compression fracture of T12 with up to 15% height loss. No bony retropulsion. Known epidural tumor along the posterior cortex is noted and similar in appearance to recent MRI. There is epidural tumor at the level of T6 and possibly on the left at T5, which appears more prominent in comparison to prior exam. Paraspinal and other soft tissues: Reported separately on same day CT of the chest. There are trace pleural effusions. Disc levels: Potential encroachment of the left lateral recess at T5 and T6 by epidural tumor, not well assessed by CT. CT LUMBAR SPINE FINDINGS Segmentation: 5 lumbar type vertebrae. Alignment: Unchanged grade 1 anterolisthesis at L5-S1. Vertebrae: Diffuse osseous metastases throughout the lumbar spine, visualized sacrum and pelvis. Unchanged mild right-sided anterior compression deformity of L1. Superior endplate Schmorl's nodes at L2, L3, and L4 are unchanged. There is no evidence of acute lumbar spine fracture. There are chronic bilateral L5 pars defects. Paraspinal and other soft tissues: Reported separately on same day CT of the abdomen and pelvis. Disc levels: Best evaluated on recent lumbar spine MRI. There is multilevel disc bulging and bilateral facet arthropathy. There is severe bilateral neural foraminal stenosis at L5-S1. 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 Acute pathologic compression fracture of T12 with up to 15% height loss. No bony retropulsion. Known epidural tumor along the posterior cortex of T12 is similar appearance to recent MRI. There is also epidural tumor at the level of T6 and possibly on the left at T5, which appears more prominent in comparison to MRI in October 2023. No acute fracture in the lumbar spine. Chronic L5 pars defects with grade 1 anterolisthesis and severe bilateral neural foraminal stenosis at L5-S1. Electronically Signed   By: Maurine Simmering  M.D.   On: 02/01/2022 10:20   CT L-SPINE NO CHARGE  Result Date: 02/01/2022 CLINICAL DATA:  Back trauma, no prior  imaging (Age >= 16y) EXAM: CT THORACIC AND LUMBAR SPINE WITHOUT CONTRAST TECHNIQUE: Multidetector CT imaging of the thoracic and lumbar spine was performed without contrast. Multiplanar CT image reconstructions were also generated. RADIATION DOSE REDUCTION: This exam was performed according to the departmental dose-optimization program which includes automated exposure control, adjustment of the mA and/or kV according to patient size and/or use of iterative reconstruction technique. COMPARISON:  MRI 12/04/2021, CT 11/18/2021 FINDINGS: CT THORACIC SPINE FINDINGS Alignment: Physiologic. Vertebrae: There is diffuse osseous metastatic disease throughout the thoracic spine and visualized ribs. Unchanged pathologic fracture of the left posterior sixth rib. There is an acute compression fracture of T12 with up to 15% height loss. No bony retropulsion. Known epidural tumor along the posterior cortex is noted and similar in appearance to recent MRI. There is epidural tumor at the level of T6 and possibly on the left at T5, which appears more prominent in comparison to prior exam. Paraspinal and other soft tissues: Reported separately on same day CT of the chest. There are trace pleural effusions. Disc levels: Potential encroachment of the left lateral recess at T5 and T6 by epidural tumor, not well assessed by CT. CT LUMBAR SPINE FINDINGS Segmentation: 5 lumbar type vertebrae. Alignment: Unchanged grade 1 anterolisthesis at L5-S1. Vertebrae: Diffuse osseous metastases throughout the lumbar spine, visualized sacrum and pelvis. Unchanged mild right-sided anterior compression deformity of L1. Superior endplate Schmorl's nodes at L2, L3, and L4 are unchanged. There is no evidence of acute lumbar spine fracture. There are chronic bilateral L5 pars defects. Paraspinal and other soft tissues: Reported separately on  same day CT of the abdomen and pelvis. Disc levels: Best evaluated on recent lumbar spine MRI. There is multilevel disc bulging and bilateral facet arthropathy. There is severe bilateral neural foraminal stenosis at L5-S1. 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 Acute pathologic compression fracture of T12 with up to 15% height loss. No bony retropulsion. Known epidural tumor along the posterior cortex of T12 is similar appearance to recent MRI. There is also epidural tumor at the level of T6 and possibly on the left at T5, which appears more prominent in comparison to MRI in October 2023. No acute fracture in the lumbar spine. Chronic L5 pars defects with grade 1 anterolisthesis and severe bilateral neural foraminal stenosis at L5-S1. Electronically Signed   By: Maurine Simmering M.D.   On: 02/01/2022 10:20   CT CHEST ABDOMEN PELVIS W CONTRAST  Result Date: 02/01/2022 CLINICAL DATA:  Blunt poly trauma status post fall EXAM: CT CHEST, ABDOMEN, AND PELVIS WITH CONTRAST TECHNIQUE: Multidetector CT imaging of the chest, abdomen and pelvis was performed following the standard protocol during bolus administration of intravenous contrast. RADIATION DOSE REDUCTION: This exam was performed according to the departmental dose-optimization program which includes automated exposure control, adjustment of the mA and/or kV according to patient size and/or use of iterative reconstruction technique. CONTRAST:  27m OMNIPAQUE IOHEXOL 350 MG/ML SOLN COMPARISON:  None Available. FINDINGS: CT CHEST FINDINGS Cardiovascular: No significant vascular findings. Normal heart size. No pericardial effusion. Thoracic aortic atherosclerosis. Coronary artery atherosclerosis. Mediastinum/Nodes: No enlarged mediastinal, hilar, or axillary lymph nodes. Thyroid gland, trachea, and esophagus demonstrate no significant findings. Lungs/Pleura: Small bilateral pleural effusions. No focal consolidation,  pleural effusion or pneumothorax. Bilateral mild chronic interstitial thickening. Musculoskeletal: Sclerotic bone lesions throughout the visualized axial and appendicular skeleton consistent with metastatic prostate cancer. Pathologic fracture of the left posterior sixth rib. CT ABDOMEN PELVIS FINDINGS Hepatobiliary: No focal liver  abnormality is seen. No gallstones, gallbladder wall thickening, or biliary dilatation. Pancreas: Unremarkable. No pancreatic ductal dilatation or surrounding inflammatory changes. Spleen: Normal in size without focal abnormality. Adrenals/Urinary Tract: Adrenal glands are unremarkable. Kidneys are normal, without renal calculi, focal lesion, or hydronephrosis. Diffuse bladder wall thickening. 2.4 x 1.1 cm enhancing nodular soft tissue component along the ventral aspect of the bladder. Foley catheter within the bladder. Stomach/Bowel: Stomach is within normal limits. No evidence of bowel wall thickening, distention, or inflammatory changes. Appendix is normal. Vascular/Lymphatic: Normal caliber abdominal aorta with mild atherosclerosis. No lymphadenopathy. Reproductive: Prostate is unremarkable. Other: No abdominal wall hernia or abnormality. No abdominopelvic ascites. Musculoskeletal: No acute fracture. Chronic L2 and L4 vertebral body compression fractures. Grade 1 anterolisthesis of L5 on S1 secondary to bilateral pars interarticularis defects. Sclerotic bone lesions throughout the visualized skeleton consistent with metastatic disease. IMPRESSION: 1. No acute injury of the chest, abdomen or pelvis. 2. Small bilateral pleural effusions. 3. Diffuse bladder wall thickening with a 2.4 x 1.1 cm enhancing nodular soft tissue component along the ventral aspect of the bladder. Differential considerations include bladder outlet obstruction versus cystitis. 4. Sclerotic bone lesions throughout the visualized axial and appendicular skeleton consistent with metastatic disease. Pathologic fracture  of the left posterior sixth rib. 5.  Aortic Atherosclerosis (ICD10-I70.0). Electronically Signed   By: Kathreen Devoid M.D.   On: 02/01/2022 10:06   CT Head Wo Contrast  Result Date: 02/01/2022 CLINICAL DATA:  Fall down 20 steps, level 2 trauma EXAM: CT HEAD WITHOUT CONTRAST CT CERVICAL SPINE WITHOUT CONTRAST TECHNIQUE: Multidetector CT imaging of the head and cervical spine was performed following the standard protocol without intravenous contrast. Multiplanar CT image reconstructions of the cervical spine were also generated. RADIATION DOSE REDUCTION: This exam was performed according to the departmental dose-optimization program which includes automated exposure control, adjustment of the mA and/or kV according to patient size and/or use of iterative reconstruction technique. COMPARISON:  Cervical MRI 07/16/2021 FINDINGS: CT HEAD FINDINGS Brain: No evidence of acute infarction, hemorrhage, hydrocephalus, extra-axial collection or mass lesion/mass effect. Generalized atrophy and chronic small vessel ischemia. Vascular: No hyperdense vessel or unexpected calcification. Skull: Normal. Negative for fracture or focal lesion. Sinuses/Orbits: No acute finding. CT CERVICAL SPINE FINDINGS Alignment: Mild C7-T1 anterolisthesis. Mild C3-4 and C4-5 anterolisthesis. Skull base and vertebrae: Left articular process and transverse process fracture, nondisplaced. Extensive osseous metastatic disease with known extraosseous tumor growth from the right T2 transverse process and along the posterior epidural space at the cervicothoracic junction. Soft tissues and spinal canal: As above Disc levels: Generalized degenerative facet spurring and endplate ridging. C2-3 and C3-4 facet ankylosis. Upper chest: Reported separately These results were called by telephone at the time of interpretation on 02/01/2022 at 10:05 am to provider Theodis Blaze , who verbally acknowledged these results. IMPRESSION: 1. Acute C7 left articular and  transverse process fractures. C7-T1 anterolisthesis that is chronic based on prior MR imaging. 2. Known osseous metastatic disease with posterior epidural tumor the cervicothoracic junction which limits detection epidural blood products. 3. No evidence of intracranial injury. Electronically Signed   By: Jorje Guild M.D.   On: 02/01/2022 10:05   CT Cervical Spine Wo Contrast  Result Date: 02/01/2022 CLINICAL DATA:  Fall down 20 steps, level 2 trauma EXAM: CT HEAD WITHOUT CONTRAST CT CERVICAL SPINE WITHOUT CONTRAST TECHNIQUE: Multidetector CT imaging of the head and cervical spine was performed following the standard protocol without intravenous contrast. Multiplanar CT image reconstructions of the cervical spine were  also generated. RADIATION DOSE REDUCTION: This exam was performed according to the departmental dose-optimization program which includes automated exposure control, adjustment of the mA and/or kV according to patient size and/or use of iterative reconstruction technique. COMPARISON:  Cervical MRI 07/16/2021 FINDINGS: CT HEAD FINDINGS Brain: No evidence of acute infarction, hemorrhage, hydrocephalus, extra-axial collection or mass lesion/mass effect. Generalized atrophy and chronic small vessel ischemia. Vascular: No hyperdense vessel or unexpected calcification. Skull: Normal. Negative for fracture or focal lesion. Sinuses/Orbits: No acute finding. CT CERVICAL SPINE FINDINGS Alignment: Mild C7-T1 anterolisthesis. Mild C3-4 and C4-5 anterolisthesis. Skull base and vertebrae: Left articular process and transverse process fracture, nondisplaced. Extensive osseous metastatic disease with known extraosseous tumor growth from the right T2 transverse process and along the posterior epidural space at the cervicothoracic junction. Soft tissues and spinal canal: As above Disc levels: Generalized degenerative facet spurring and endplate ridging. C2-3 and C3-4 facet ankylosis. Upper chest: Reported  separately These results were called by telephone at the time of interpretation on 02/01/2022 at 10:05 am to provider Theodis Blaze , who verbally acknowledged these results. IMPRESSION: 1. Acute C7 left articular and transverse process fractures. C7-T1 anterolisthesis that is chronic based on prior MR imaging. 2. Known osseous metastatic disease with posterior epidural tumor the cervicothoracic junction which limits detection epidural blood products. 3. No evidence of intracranial injury. Electronically Signed   By: Jorje Guild M.D.   On: 02/01/2022 10:05   DG Chest Port 1 View  Result Date: 02/01/2022 CLINICAL DATA:  Fall. EXAM: PORTABLE CHEST 1 VIEW COMPARISON:  Chest x-ray dated November 20, 2021. FINDINGS: The heart size and mediastinal contours are within normal limits. No focal consolidation, pleural effusion, or pneumothorax. No acute osseous abnormality. Diffuse sclerotic bony metastatic disease is better appreciated on CT. IMPRESSION: 1. No active disease. 2. Diffuse sclerotic bony metastatic disease is better appreciated on CT. Electronically Signed   By: Titus Dubin M.D.   On: 02/01/2022 09:31   DG Pelvis Portable  Result Date: 02/01/2022 CLINICAL DATA:  Fall EXAM: PORTABLE PELVIS 1-2 VIEWS COMPARISON:  11/18/2021 FINDINGS: There is no evidence of pelvic fracture or diastasis. Bilateral hip joints are intact without evidence of fracture or dislocation. Widespread sclerotic bony lesions throughout the pelvis and proximal femurs compatible with known metastatic disease. IMPRESSION: 1. No acute fracture or diastasis. 2. Widespread sclerotic bony lesions compatible with known metastatic disease. Electronically Signed   By: Davina Poke D.O.   On: 02/01/2022 09:26   DG Shoulder Left  Result Date: 02/01/2022 CLINICAL DATA:  Fall downstairs.  20 steps EXAM: LEFT SHOULDER - 2+ VIEW COMPARISON:  None Available. FINDINGS: Glenohumeral joint is intact. No evidence of scapular fracture or  humeral fracture. The acromioclavicular joint is intact. Sclerotic lesions in the humerus. Known sclerotic skeletal metastasis IMPRESSION: 1. No fracture dislocation. 2. Sclerotic skeletal metastasis again demonstrated. Electronically Signed   By: Suzy Bouchard M.D.   On: 02/01/2022 09:25    Procedures Procedures   Medications Ordered in ED Medications  iohexol (OMNIPAQUE) 350 MG/ML injection 75 mL (75 mLs Intravenous Contrast Given 02/01/22 0951)  morphine (PF) 4 MG/ML injection 4 mg (4 mg Intravenous Given 02/01/22 1047)  ondansetron (ZOFRAN) injection 4 mg (4 mg Intravenous Given 02/01/22 1046)  morphine (PF) 4 MG/ML injection 4 mg (4 mg Intravenous Given 02/01/22 1250)    ED Course/ Medical Decision Making/ A&P Clinical Course as of 02/01/22 1336  Wed Feb 01, 2022  1055 Spoke with Dr. Kathyrn Sheriff, neurosurgery. Requests Aspen or  [place] J c-collar and f/u in clinic in 3-4 weeks [LA]  1247 Unable to ambulate 2/2 pain. Will redose morphine and try again  [LA]  1310 Again unable to ambulate after 2nd dose of morphine, will admit for pain control, PT [LA]    Clinical Course User Index [LA] Mickie Hillier, PA-C                           Medical Decision Making Amount and/or Complexity of Data Reviewed Labs: ordered. Radiology: ordered.  Risk Prescription drug management. Decision regarding hospitalization.  Initial Impression and Ddx 88 year old male who presents as a level 2 trauma after a fall down 20 steps.  He arrives in c-collar.  He overall is awake, alert and oriented.  He is complaining of pain to the neck and back.  He is moving all extremities well.  No obvious trauma to the chest abdomen or pelvis.  He does have indwelling Foley catheter on arrival.  He has a nonfocal neurological exam.  Will order trauma scans and add on T and L-spine imaging. Patient PMH that increases complexity of ED encounter: Metastatic prostate cancer  Interpretation of Diagnostics I independent  reviewed and interpreted the labs as followed: Stable anemia  - I independently visualized the following imaging with scope of interpretation limited to determining acute life threatening conditions related to emergency care: CT of the head, C, T, L-spine, CT chest abdomen pelvis, plain film of the left shoulder, chest, pelvis, which revealed acute C7 fracture, acute pathologic T12 fracture, known metastatic skeletal lesions, pathologic fracture of the left posterior sixth rib  Patient Reassessment and Ultimate Disposition/Management Physical exam as noted above in the PE portion of the note.  He had trauma scans performed.  On reassessment he is having pain so morphine and Zofran were ordered.  He remains in c-collar.  Consulted and spoke with Dr. Kathyrn Sheriff, neurosurgery given the acute C7 fracture.  He request Aspen or [place] J collar for 3 to 4 weeks and then follow-up in the outpatient clinic.  He does not have any recommendations for intervention to the pathologic T12 fracture.  His labs are unremarkable.  No in acute internal bleeding, intracranial bleeding.  He has diffuse skeletal metastatic disease.  No blood in his Foley catheter.  At the request of family, I also sent an epic message to his oncologist as he was supposed to start radiation today.  Patient was initially dosed with 4 mg of IV morphine.  We attempted to ambulate him and he could not get out of bed secondary to pain.  He was then redosed with morphine and another attempt was made to ambulate the patient for evaluation for discharge.  He was then unable to do this.  Consulted and spoke with Dr. Jodell Cipro, unassigned medicine admission who agrees to admit the patient for pain control and physical therapy. Family is aware and agreeable to plan.   Patient management required discussion with the following services or consulting groups:  Neurosurgery  Complexity of Problems Addressed Acute illness or injury that poses threat of life  of bodily function  Additional Data Reviewed and Analyzed Further history obtained from: EMS on arrival, Further history from spouse/family member, Past medical history and medications listed in the EMR, Recent PCP notes, Recent Consult notes, Care Everywhere, and Prior labs/imaging results  Patient Encounter Risk Assessment SDOH impact on management, Use of parenteral controlled substances, and Consideration of hospitalization  Final Clinical Impression(s) /  ED Diagnoses Final diagnoses:  Fall, initial encounter    Rx / DC Orders ED Discharge Orders     None         Mickie Hillier, PA-C 02/01/22 1340    Long, Wonda [facility], MD 02/03/22 1626

## 2022-02-01 NOTE — ED Notes (Signed)
ED TO INPATIENT HANDOFF REPORT  ED Nurse Name and Phone #: Philip Aspen Name/Age/Gender Michael [name] 88 y.o. male Room/Bed: 023C/023C  Code Status   Code Status: DNR  Home/SNF/Other Home Patient oriented to: self, place, time, and situation Is this baseline? Yes   Triage Complete: Triage complete  Chief Complaint C7 cervical fracture ([place]) [J67.341P]  Triage Note Pt BIB [facility] as a fall, activated level 2 trauma. Pt was putting on socks and fell down 20 steps. Pt is not on any blood thinners. Pt is axox4 on arrival. Denies LOC. Pt arrives in c-collar and complains of neck pain. VSS.    Allergies No Known Allergies  Level of Care/Admitting Diagnosis ED Disposition     ED Disposition  Admit   Condition  --   Comment  Hospital Area: [facility] [[id number]]  Level of Care: Med-Surg [16]  May place patient in observation at [facility] or [facility] if equivalent level of care is available:: Yes  Covid Evaluation: Asymptomatic - no recent exposure (last 10 days) testing not required  Diagnosis: C7 cervical fracture [facility]) [[id number]]  Admitting Physician: Charise Killian [[id number]]  Attending Physician: Charise Killian [[id number]]          B Medical/Surgery History Past Medical History:  Diagnosis Date   Anemia    iron deficiency   Aortic atherosclerosis ([place])    Bilateral carotid artery disease ([place])    Mild plaque formation without obstructive disease noted on carotid Doppler.   Diabetes mellitus without complication ([place])    Essential hypertension    GERD (gastroesophageal reflux disease)    Hyperlipidemia    Hypertension    Left carotid artery stenosis    Nonobstructive Coronary artery disease    a. Previous cardiac catheterization at [facility] in 2010. The patient was told about 2 blockages which did not require revascularization; b. 01/2015 Cath: LM nl, LAD 10p/m, 34md, D2 30ost, LCX nl, RCA min irregs, EF 55-65%; b. 12/2018 MV: No  ischemia/infarct. CT images w/ mod 3 vessel Cor Ca2+ and mild-mod Ao atherosclerosis.   [place]   Raynaud disease    a. Managed w/ nifedipine.   Past Surgical History:  Procedure Laterality Date   CARDIAC CATHETERIZATION  2010   CARDIAC CATHETERIZATION N/A 02/03/2015   Procedure: Left Heart Cath and Coronary Angiography;  Surgeon: MWellington [name] MD;  [facility];  Service: Cardiovascular;  Laterality: N/A;   CATARACT EXTRACTION Bilateral    CYSTOSCOPY W/ RETROGRADES Bilateral 04/11/2017   Procedure: CYSTOSCOPY WITH RETROGRADE PYELOGRAM;  Surgeon: BHollice Espy MD;  Location: [facility];  Service: Urology;  Laterality: Bilateral;   CYSTOSCOPY W/ RETROGRADES Bilateral 09/12/2017   Procedure: CYSTOSCOPY WITH RETROGRADE PYELOGRAM;  Surgeon: BHollice Espy MD;  Location: [facility];  Service: Urology;  Laterality: Bilateral;   CYSTOSCOPY W/ RETROGRADES Bilateral 02/25/2018   Procedure: CYSTOSCOPY WITH RETROGRADE PYELOGRAM;  Surgeon: BHollice Espy MD;  Location: [facility];  Service: Urology;  Laterality: Bilateral;   CYSTOSCOPY W/ URETERAL STENT PLACEMENT Bilateral 09/12/2017   Procedure: CYSTOSCOPY WITH STENT REPLACEMENT;  Surgeon: BHollice Espy MD;  Location: [facility];  Service: Urology;  Laterality: Bilateral;   CYSTOSCOPY W/ URETERAL STENT REMOVAL Bilateral 02/25/2018   Procedure: CYSTOSCOPY WITH STENT REMOVAL;  Surgeon: BHollice Espy MD;  Location: [facility];  Service: Urology;  Laterality: Bilateral;   CYSTOSCOPY WITH STENT PLACEMENT Left 04/11/2017   Procedure: CYSTOSCOPY WITH STENT PLACEMENT and fulgeration;  Surgeon: BHollice Espy MD;  Location: [facility];  Service: Urology;  Laterality: Left;   CYSTOSCOPY WITH URETHRAL DILATATION Bilateral 04/11/2017   Procedure: CYSTOSCOPY WITH URETHRAL DILATATION;  Surgeon: [name], MD;  Location: [facility];  Service: Urology;  Laterality: Bilateral;   IR CONVERT RIGHT NEPHROSTOMY TO NEPHROURETERAL CATH   05/21/2017   IR NEPHROSTOMY PLACEMENT RIGHT  04/13/2017     A IV Location/Drains/Wounds Patient Lines/Drains/Airways Status     Active Line/Drains/Airways     Name Placement date Placement time Site Days   Peripheral IV 02/01/22 20 G Right Antecubital 02/01/22  0846  Antecubital  less than 1   Midline Single Lumen 78/46/96 Left Cephalic 8 cm 0 cm 29/52/84  1324  Cephalic  71   Urethral Catheter Dr. Natasha Mead - Urology Non-latex 16 Fr. 11/18/21  --  Non-latex  75            Intake/Output Last 24 hours No intake or output data in the 24 hours ending 02/01/22 1550  Labs/Imaging Results for orders placed or performed during the hospital encounter of 02/01/22 (from the past 48 hour(s))  Basic metabolic panel     Status: Abnormal   Collection Time: 02/01/22  8:35 AM  Result Value Ref Range   Sodium 132 (L) 135 - 145 mmol/L   Potassium 4.0 3.5 - 5.1 mmol/L   Chloride 99 98 - 111 mmol/L   CO2 22 22 - 32 mmol/L   Glucose, Bld 173 (H) 70 - 99 mg/dL    Comment: Glucose reference range applies only to samples taken after fasting for at least 8 hours.   BUN 12 8 - 23 mg/dL   Creatinine, Ser 1.03 0.61 - 1.24 mg/dL   Calcium 8.8 (L) 8.9 - 10.3 mg/dL   GFR, Estimated >60 >60 mL/min    Comment: (NOTE) Calculated using the CKD-EPI Creatinine Equation (2021)    Anion gap 11 5 - 15    Comment: Performed at [facility] [address] [place], [place] [address]  CBC with Differential     Status: Abnormal   Collection Time: 02/01/22  8:35 AM  Result Value Ref Range   WBC 5.7 4.0 - 10.5 K/uL   RBC 3.11 (L) 4.22 - 5.81 MIL/uL   Hemoglobin 9.7 (L) 13.0 - 17.0 g/dL   HCT 30.3 (L) 39.0 - 52.0 %   MCV 97.4 80.0 - 100.0 fL   MCH 31.2 26.0 - 34.0 pg   MCHC 32.0 30.0 - 36.0 g/dL   RDW 17.0 (H) 11.5 - 15.5 %   Platelets 224 150 - 400 K/uL   nRBC 0.0 0.0 - 0.2 %   Neutrophils Relative % 68 %   Neutro Abs 3.9 1.7 - 7.7 K/uL   Lymphocytes Relative 15 %   Lymphs Abs 0.9 0.7 - 4.0  K/uL   Monocytes Relative 14 %   Monocytes Absolute 0.8 0.1 - 1.0 K/uL   Eosinophils Relative 1 %   Eosinophils Absolute 0.1 0.0 - 0.5 K/uL   Basophils Relative 1 %   Basophils Absolute 0.0 0.0 - 0.1 K/uL   Immature Granulocytes 1 %   Abs Immature Granulocytes 0.06 0.00 - 0.07 K/uL    Comment: Performed at [facility] [address] [place], [place] [address]  I-stat chem 8, ED (not at [facility], [facility] or [facility])     Status: Abnormal   Collection Time: 02/01/22  9:27 AM  Result Value Ref Range   Sodium 134 (L) 135 - 145 mmol/L  Potassium 4.1 3.5 - 5.1 mmol/L   Chloride 100 98 - 111 mmol/L   BUN 15 8 - 23 mg/dL   Creatinine, Ser 0.90 0.61 - 1.24 mg/dL   Glucose, Bld 175 (H) 70 - 99 mg/dL    Comment: Glucose reference range applies only to samples taken after fasting for at least 8 hours.   Calcium, Ion 1.12 (L) 1.15 - 1.40 mmol/L   TCO2 24 22 - 32 mmol/L   Hemoglobin 9.9 (L) 13.0 - 17.0 g/dL   HCT 29.0 (L) 39.0 - 52.0 %   CT T-SPINE NO CHARGE  Result Date: 02/01/2022 CLINICAL DATA:  Back trauma, no prior imaging (Age >= 16y) EXAM: CT THORACIC AND LUMBAR SPINE WITHOUT CONTRAST TECHNIQUE: Multidetector CT imaging of the thoracic and lumbar spine was performed without contrast. Multiplanar CT image reconstructions were also generated. RADIATION DOSE REDUCTION: This exam was performed according to the departmental dose-optimization program which includes automated exposure control, adjustment of the mA and/or kV according to patient size and/or use of iterative reconstruction technique. COMPARISON:  MRI 12/04/2021, CT 11/18/2021 FINDINGS: CT THORACIC SPINE FINDINGS Alignment: Physiologic. Vertebrae: There is diffuse osseous metastatic disease throughout the thoracic spine and visualized ribs. Unchanged pathologic fracture of the left posterior sixth rib. There is an acute compression fracture of T12 with up to 15% height loss. No bony retropulsion. Known epidural tumor along the posterior  cortex is noted and similar in appearance to recent MRI. There is epidural tumor at the level of T6 and possibly on the left at T5, which appears more prominent in comparison to prior exam. Paraspinal and other soft tissues: Reported separately on same day CT of the chest. There are trace pleural effusions. Disc levels: Potential encroachment of the left lateral recess at T5 and T6 by epidural tumor, not well assessed by CT. CT LUMBAR SPINE FINDINGS Segmentation: 5 lumbar type vertebrae. Alignment: Unchanged grade 1 anterolisthesis at L5-S1. Vertebrae: Diffuse osseous metastases throughout the lumbar spine, visualized sacrum and pelvis. Unchanged mild right-sided anterior compression deformity of L1. Superior endplate Schmorl's nodes at L2, L3, and L4 are unchanged. There is no evidence of acute lumbar spine fracture. There are chronic bilateral L5 pars defects. Paraspinal and other soft tissues: Reported separately on same day CT of the abdomen and pelvis. Disc levels: Best evaluated on recent lumbar spine MRI. There is multilevel disc bulging and bilateral facet arthropathy. There is severe bilateral neural foraminal stenosis at L5-S1. 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 Acute pathologic compression fracture of T12 with up to 15% height loss. No bony retropulsion. Known epidural tumor along the posterior cortex of T12 is similar appearance to recent MRI. There is also epidural tumor at the level of T6 and possibly on the left at T5, which appears more prominent in comparison to MRI in October 2023. No acute fracture in the lumbar spine. Chronic L5 pars defects with grade 1 anterolisthesis and severe bilateral neural foraminal stenosis at L5-S1. Electronically Signed   By: Maurine Simmering M.D.   On: 02/01/2022 10:20   CT L-SPINE NO CHARGE  Result Date: 02/01/2022 CLINICAL DATA:  Back trauma, no prior imaging (Age >= 16y) EXAM: CT THORACIC AND LUMBAR SPINE  WITHOUT CONTRAST TECHNIQUE: Multidetector CT imaging of the thoracic and lumbar spine was performed without contrast. Multiplanar CT image reconstructions were also generated. RADIATION DOSE REDUCTION: This exam was performed according to the departmental dose-optimization program which includes automated exposure control, adjustment of the mA  and/or kV according to patient size and/or use of iterative reconstruction technique. COMPARISON:  MRI 12/04/2021, CT 11/18/2021 FINDINGS: CT THORACIC SPINE FINDINGS Alignment: Physiologic. Vertebrae: There is diffuse osseous metastatic disease throughout the thoracic spine and visualized ribs. Unchanged pathologic fracture of the left posterior sixth rib. There is an acute compression fracture of T12 with up to 15% height loss. No bony retropulsion. Known epidural tumor along the posterior cortex is noted and similar in appearance to recent MRI. There is epidural tumor at the level of T6 and possibly on the left at T5, which appears more prominent in comparison to prior exam. Paraspinal and other soft tissues: Reported separately on same day CT of the chest. There are trace pleural effusions. Disc levels: Potential encroachment of the left lateral recess at T5 and T6 by epidural tumor, not well assessed by CT. CT LUMBAR SPINE FINDINGS Segmentation: 5 lumbar type vertebrae. Alignment: Unchanged grade 1 anterolisthesis at L5-S1. Vertebrae: Diffuse osseous metastases throughout the lumbar spine, visualized sacrum and pelvis. Unchanged mild right-sided anterior compression deformity of L1. Superior endplate Schmorl's nodes at L2, L3, and L4 are unchanged. There is no evidence of acute lumbar spine fracture. There are chronic bilateral L5 pars defects. Paraspinal and other soft tissues: Reported separately on same day CT of the abdomen and pelvis. Disc levels: Best evaluated on recent lumbar spine MRI. There is multilevel disc bulging and bilateral facet arthropathy. There is  severe bilateral neural foraminal stenosis at L5-S1. 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 Acute pathologic compression fracture of T12 with up to 15% height loss. No bony retropulsion. Known epidural tumor along the posterior cortex of T12 is similar appearance to recent MRI. There is also epidural tumor at the level of T6 and possibly on the left at T5, which appears more prominent in comparison to MRI in October 2023. No acute fracture in the lumbar spine. Chronic L5 pars defects with grade 1 anterolisthesis and severe bilateral neural foraminal stenosis at L5-S1. Electronically Signed   By: Maurine Simmering M.D.   On: 02/01/2022 10:20   CT CHEST ABDOMEN PELVIS W CONTRAST  Result Date: 02/01/2022 CLINICAL DATA:  Blunt poly trauma status post fall EXAM: CT CHEST, ABDOMEN, AND PELVIS WITH CONTRAST TECHNIQUE: Multidetector CT imaging of the chest, abdomen and pelvis was performed following the standard protocol during bolus administration of intravenous contrast. RADIATION DOSE REDUCTION: This exam was performed according to the departmental dose-optimization program which includes automated exposure control, adjustment of the mA and/or kV according to patient size and/or use of iterative reconstruction technique. CONTRAST:  66m OMNIPAQUE IOHEXOL 350 MG/ML SOLN COMPARISON:  None Available. FINDINGS: CT CHEST FINDINGS Cardiovascular: No significant vascular findings. Normal heart size. No pericardial effusion. Thoracic aortic atherosclerosis. Coronary artery atherosclerosis. Mediastinum/Nodes: No enlarged mediastinal, hilar, or axillary lymph nodes. Thyroid gland, trachea, and esophagus demonstrate no significant findings. Lungs/Pleura: Small bilateral pleural effusions. No focal consolidation, pleural effusion or pneumothorax. Bilateral mild chronic interstitial thickening. Musculoskeletal: Sclerotic bone lesions throughout the visualized axial and appendicular  skeleton consistent with metastatic prostate cancer. Pathologic fracture of the left posterior sixth rib. CT ABDOMEN PELVIS FINDINGS Hepatobiliary: No focal liver abnormality is seen. No gallstones, gallbladder wall thickening, or biliary dilatation. Pancreas: Unremarkable. No pancreatic ductal dilatation or surrounding inflammatory changes. Spleen: Normal in size without focal abnormality. Adrenals/Urinary Tract: Adrenal glands are unremarkable. Kidneys are normal, without renal calculi, focal lesion, or hydronephrosis. Diffuse bladder wall thickening. 2.4 x 1.1 cm enhancing nodular soft  tissue component along the ventral aspect of the bladder. Foley catheter within the bladder. Stomach/Bowel: Stomach is within normal limits. No evidence of bowel wall thickening, distention, or inflammatory changes. Appendix is normal. Vascular/Lymphatic: Normal caliber abdominal aorta with mild atherosclerosis. No lymphadenopathy. Reproductive: Prostate is unremarkable. Other: No abdominal wall hernia or abnormality. No abdominopelvic ascites. Musculoskeletal: No acute fracture. Chronic L2 and L4 vertebral body compression fractures. Grade 1 anterolisthesis of L5 on S1 secondary to bilateral pars interarticularis defects. Sclerotic bone lesions throughout the visualized skeleton consistent with metastatic disease. IMPRESSION: 1. No acute injury of the chest, abdomen or pelvis. 2. Small bilateral pleural effusions. 3. Diffuse bladder wall thickening with a 2.4 x 1.1 cm enhancing nodular soft tissue component along the ventral aspect of the bladder. Differential considerations include bladder outlet obstruction versus cystitis. 4. Sclerotic bone lesions throughout the visualized axial and appendicular skeleton consistent with metastatic disease. Pathologic fracture of the left posterior sixth rib. 5.  Aortic Atherosclerosis (ICD10-I70.0). Electronically Signed   By: Kathreen Devoid M.D.   On: 02/01/2022 10:06   CT Head Wo  Contrast  Result Date: 02/01/2022 CLINICAL DATA:  Fall down 20 steps, level 2 trauma EXAM: CT HEAD WITHOUT CONTRAST CT CERVICAL SPINE WITHOUT CONTRAST TECHNIQUE: Multidetector CT imaging of the head and cervical spine was performed following the standard protocol without intravenous contrast. Multiplanar CT image reconstructions of the cervical spine were also generated. RADIATION DOSE REDUCTION: This exam was performed according to the departmental dose-optimization program which includes automated exposure control, adjustment of the mA and/or kV according to patient size and/or use of iterative reconstruction technique. COMPARISON:  Cervical MRI 07/16/2021 FINDINGS: CT HEAD FINDINGS Brain: No evidence of acute infarction, hemorrhage, hydrocephalus, extra-axial collection or mass lesion/mass effect. Generalized atrophy and chronic small vessel ischemia. Vascular: No hyperdense vessel or unexpected calcification. Skull: Normal. Negative for fracture or focal lesion. Sinuses/Orbits: No acute finding. CT CERVICAL SPINE FINDINGS Alignment: Mild C7-T1 anterolisthesis. Mild C3-4 and C4-5 anterolisthesis. Skull base and vertebrae: Left articular process and transverse process fracture, nondisplaced. Extensive osseous metastatic disease with known extraosseous tumor growth from the right T2 transverse process and along the posterior epidural space at the cervicothoracic junction. Soft tissues and spinal canal: As above Disc levels: Generalized degenerative facet spurring and endplate ridging. C2-3 and C3-4 facet ankylosis. Upper chest: Reported separately These results were called by telephone at the time of interpretation on 02/01/2022 at 10:05 am to provider Theodis Blaze , who verbally acknowledged these results. IMPRESSION: 1. Acute C7 left articular and transverse process fractures. C7-T1 anterolisthesis that is chronic based on prior MR imaging. 2. Known osseous metastatic disease with posterior epidural tumor the  cervicothoracic junction which limits detection epidural blood products. 3. No evidence of intracranial injury. Electronically Signed   By: Jorje Guild M.D.   On: 02/01/2022 10:05   CT Cervical Spine Wo Contrast  Result Date: 02/01/2022 CLINICAL DATA:  Fall down 20 steps, level 2 trauma EXAM: CT HEAD WITHOUT CONTRAST CT CERVICAL SPINE WITHOUT CONTRAST TECHNIQUE: Multidetector CT imaging of the head and cervical spine was performed following the standard protocol without intravenous contrast. Multiplanar CT image reconstructions of the cervical spine were also generated. RADIATION DOSE REDUCTION: This exam was performed according to the departmental dose-optimization program which includes automated exposure control, adjustment of the mA and/or kV according to patient size and/or use of iterative reconstruction technique. COMPARISON:  Cervical MRI 07/16/2021 FINDINGS: CT HEAD FINDINGS Brain: No evidence of acute infarction, hemorrhage, hydrocephalus, extra-axial collection  or mass lesion/mass effect. Generalized atrophy and chronic small vessel ischemia. Vascular: No hyperdense vessel or unexpected calcification. Skull: Normal. Negative for fracture or focal lesion. Sinuses/Orbits: No acute finding. CT CERVICAL SPINE FINDINGS Alignment: Mild C7-T1 anterolisthesis. Mild C3-4 and C4-5 anterolisthesis. Skull base and vertebrae: Left articular process and transverse process fracture, nondisplaced. Extensive osseous metastatic disease with known extraosseous tumor growth from the right T2 transverse process and along the posterior epidural space at the cervicothoracic junction. Soft tissues and spinal canal: As above Disc levels: Generalized degenerative facet spurring and endplate ridging. C2-3 and C3-4 facet ankylosis. Upper chest: Reported separately These results were called by telephone at the time of interpretation on 02/01/2022 at 10:05 am to provider Theodis Blaze , who verbally acknowledged these results.  IMPRESSION: 1. Acute C7 left articular and transverse process fractures. C7-T1 anterolisthesis that is chronic based on prior MR imaging. 2. Known osseous metastatic disease with posterior epidural tumor the cervicothoracic junction which limits detection epidural blood products. 3. No evidence of intracranial injury. Electronically Signed   By: Jorje Guild M.D.   On: 02/01/2022 10:05   DG Chest Port 1 View  Result Date: 02/01/2022 CLINICAL DATA:  Fall. EXAM: PORTABLE CHEST 1 VIEW COMPARISON:  Chest x-ray dated November 20, 2021. FINDINGS: The heart size and mediastinal contours are within normal limits. No focal consolidation, pleural effusion, or pneumothorax. No acute osseous abnormality. Diffuse sclerotic bony metastatic disease is better appreciated on CT. IMPRESSION: 1. No active disease. 2. Diffuse sclerotic bony metastatic disease is better appreciated on CT. Electronically Signed   By: Titus Dubin M.D.   On: 02/01/2022 09:31   DG Pelvis Portable  Result Date: 02/01/2022 CLINICAL DATA:  Fall EXAM: PORTABLE PELVIS 1-2 VIEWS COMPARISON:  11/18/2021 FINDINGS: There is no evidence of pelvic fracture or diastasis. Bilateral hip joints are intact without evidence of fracture or dislocation. Widespread sclerotic bony lesions throughout the pelvis and proximal femurs compatible with known metastatic disease. IMPRESSION: 1. No acute fracture or diastasis. 2. Widespread sclerotic bony lesions compatible with known metastatic disease. Electronically Signed   By: Davina Poke D.O.   On: 02/01/2022 09:26   DG Shoulder Left  Result Date: 02/01/2022 CLINICAL DATA:  Fall downstairs.  20 steps EXAM: LEFT SHOULDER - 2+ VIEW COMPARISON:  None Available. FINDINGS: Glenohumeral joint is intact. No evidence of scapular fracture or humeral fracture. The acromioclavicular joint is intact. Sclerotic lesions in the humerus. Known sclerotic skeletal metastasis IMPRESSION: 1. No fracture dislocation. 2.  Sclerotic skeletal metastasis again demonstrated. Electronically Signed   By: Suzy Bouchard M.D.   On: 02/01/2022 09:25    Pending Labs Unresulted Labs (From admission, onward)     Start     Ordered   02/01/22 0902  Urinalysis, Routine w reflex microscopic Urine, Clean Catch  Once,   URGENT        02/01/22 0901            Vitals/Pain Today's Vitals   02/01/22 1030 02/01/22 1100 02/01/22 1200 02/01/22 1438  BP: (!) 147/84 136/77 136/80   Pulse: 97 99 (!) 101   Resp: '14 16 20   '$ Temp:    98 F (36.7 C)  TempSrc:      SpO2: 100% 98% 99%   Weight:      Height:      PainSc:        Isolation Precautions No active isolations  Medications Medications  acetaminophen (TYLENOL) tablet 500 mg (has no administration in time  range)  ibuprofen (ADVIL) tablet 200 mg (has no administration in time range)  oxyCODONE (Oxy IR/ROXICODONE) immediate release tablet 5 mg (has no administration in time range)  atorvastatin (LIPITOR) tablet 10 mg (has no administration in time range)  metoprolol succinate (TOPROL-XL) 24 hr tablet 25 mg (has no administration in time range)  NIFEdipine (PROCARDIA-XL/NIFEDICAL-XL) 24 hr tablet 30 mg (has no administration in time range)  pantoprazole (PROTONIX) EC tablet 40 mg (has no administration in time range)  mirabegron ER (MYRBETRIQ) tablet 25 mg (has no administration in time range)  iron polysaccharides (NIFEREX) capsule 150 mg (has no administration in time range)  Calcium Carb-Cholecalciferol 600-20 MG-MCG TABS 1 tablet (has no administration in time range)  multivitamin with minerals tablet 1 tablet (has no administration in time range)  enoxaparin (LOVENOX) injection 40 mg (has no administration in time range)  bisacodyl (DULCOLAX) EC tablet 5 mg (has no administration in time range)  senna-docusate (Senokot-S) tablet 1 tablet (has no administration in time range)  iohexol (OMNIPAQUE) 350 MG/ML injection 75 mL (75 mLs Intravenous Contrast Given  02/01/22 0951)  morphine (PF) 4 MG/ML injection 4 mg (4 mg Intravenous Given 02/01/22 1047)  ondansetron (ZOFRAN) injection 4 mg (4 mg Intravenous Given 02/01/22 1046)  morphine (PF) 4 MG/ML injection 4 mg (4 mg Intravenous Given 02/01/22 1250)    Mobility non-ambulatory High fall risk   Focused Assessments     R Recommendations: See Admitting Provider Note  Report given to:   Additional Notes:

## 2022-02-02 ENCOUNTER — Other Ambulatory Visit: Payer: Self-pay | Admitting: *Deleted

## 2022-02-02 ENCOUNTER — Telehealth: Payer: Self-pay | Admitting: Internal Medicine

## 2022-02-02 ENCOUNTER — Encounter: Payer: Self-pay | Admitting: Internal Medicine

## 2022-02-02 DIAGNOSIS — Z7189 Other specified counseling: Secondary | ICD-10-CM

## 2022-02-02 DIAGNOSIS — D649 Anemia, unspecified: Secondary | ICD-10-CM

## 2022-02-02 DIAGNOSIS — Z515 Encounter for palliative care: Secondary | ICD-10-CM | POA: Diagnosis not present

## 2022-02-02 DIAGNOSIS — S12691A Other nondisplaced fracture of seventh cervical vertebra, initial encounter for closed fracture: Secondary | ICD-10-CM

## 2022-02-02 DIAGNOSIS — C61 Malignant neoplasm of prostate: Secondary | ICD-10-CM

## 2022-02-02 DIAGNOSIS — W108XXA Fall (on) (from) other stairs and steps, initial encounter: Secondary | ICD-10-CM | POA: Diagnosis not present

## 2022-02-02 DIAGNOSIS — W19XXXA Unspecified fall, initial encounter: Secondary | ICD-10-CM

## 2022-02-02 DIAGNOSIS — M542 Cervicalgia: Secondary | ICD-10-CM | POA: Diagnosis not present

## 2022-02-02 DIAGNOSIS — S12600A Unspecified displaced fracture of seventh cervical vertebra, initial encounter for closed fracture: Secondary | ICD-10-CM | POA: Diagnosis not present

## 2022-02-02 LAB — URINALYSIS, ROUTINE W REFLEX MICROSCOPIC
Bilirubin Urine: NEGATIVE
Glucose, UA: NEGATIVE mg/dL
Ketones, ur: NEGATIVE mg/dL
Nitrite: NEGATIVE
Protein, ur: NEGATIVE mg/dL
Specific Gravity, Urine: 1.029 (ref 1.005–1.030)
WBC, UA: >50 WBC/hpf — ABNORMAL HIGH (ref 0–5)
pH: 5 (ref 5.0–8.0)

## 2022-02-02 LAB — GLUCOSE, CAPILLARY
Glucose-Capillary: 166 mg/dL — ABNORMAL HIGH (ref 70–99)
Glucose-Capillary: 116 mg/dL — ABNORMAL HIGH (ref 70–99)
Glucose-Capillary: 116 mg/dL — ABNORMAL HIGH (ref 70–99)
Glucose-Capillary: 134 mg/dL — ABNORMAL HIGH (ref 70–99)
Glucose-Capillary: 134 mg/dL — ABNORMAL HIGH (ref 70–99)
Glucose-Capillary: 155 mg/dL — ABNORMAL HIGH (ref 70–99)
Glucose-Capillary: 198 mg/dL — ABNORMAL HIGH (ref 70–99)

## 2022-02-02 MED ORDER — ENZALUTAMIDE 40 MG PO TABS
80.0000 mg | ORAL_TABLET | Freq: Every day | ORAL | Status: DC
  Administered 2022-02-02 – 2022-02-08 (×7): 80 mg via ORAL
  Filled 2022-02-02 (×8): qty 2

## 2022-02-02 MED ORDER — OXYCODONE HCL 5 MG PO TABS
5.0000 mg | ORAL_TABLET | Freq: Four times a day (QID) | ORAL | Status: DC | PRN
  Administered 2022-02-02: 5 mg via ORAL
  Filled 2022-02-02: qty 1

## 2022-02-02 MED ORDER — ACETAMINOPHEN 325 MG PO TABS: 650.0000 mg | ORAL_TABLET | Freq: Four times a day (QID) | ORAL | Status: DC

## 2022-02-02 MED ORDER — OXYCODONE HCL 5 MG PO TABS: 5.0000 mg | ORAL_TABLET | ORAL | Status: DC

## 2022-02-02 MED ORDER — OXYCODONE HCL ER 10 MG PO T12A: 10.0000 mg | EXTENDED_RELEASE_TABLET | Freq: Two times a day (BID) | ORAL | Status: DC

## 2022-02-02 MED ORDER — ACETAMINOPHEN 500 MG PO TABS
1000.0000 mg | ORAL_TABLET | Freq: Three times a day (TID) | ORAL | Status: DC
  Administered 2022-02-02 – 2022-02-05 (×9): 1000 mg via ORAL
  Administered 2022-02-05: 500 mg via ORAL
  Administered 2022-02-06 – 2022-02-08 (×6): 1000 mg via ORAL
  Filled 2022-02-02 (×18): qty 2

## 2022-02-02 MED ORDER — CHLORHEXIDINE GLUCONATE CLOTH 2 % EX PADS
6.0000 | MEDICATED_PAD | Freq: Every day | CUTANEOUS | Status: DC
  Administered 2022-02-02 – 2022-02-08 (×6): 6 via TOPICAL

## 2022-02-02 MED ORDER — GLUCERNA SHAKE PO LIQD
237.0000 mL | Freq: Two times a day (BID) | ORAL | Status: DC
  Administered 2022-02-03 – 2022-02-08 (×10): 237 mL via ORAL

## 2022-02-02 MED ORDER — DICLOFENAC SODIUM 1 % EX GEL
2.0000 g | Freq: Four times a day (QID) | CUTANEOUS | Status: DC
  Administered 2022-02-02 – 2022-02-08 (×18): 2 g via TOPICAL
  Filled 2022-02-02 (×4): qty 100

## 2022-02-02 MED ORDER — SODIUM CHLORIDE 0.9 % IV SOLN
200.0000 mg | Freq: Once | INTRAVENOUS | Status: DC
  Filled 2022-02-02: qty 10

## 2022-02-02 MED ORDER — SODIUM CHLORIDE 0.9 % IV SOLN
200.0000 mg | Freq: Once | INTRAVENOUS | Status: AC
  Administered 2022-02-03: 200 mg via INTRAVENOUS
  Filled 2022-02-02: qty 10

## 2022-02-02 MED ORDER — ACETAMINOPHEN 325 MG PO TABS: 650.0000 mg | ORAL_TABLET | Freq: Three times a day (TID) | ORAL | Status: DC

## 2022-02-02 MED ORDER — OXYCODONE HCL 5 MG PO TABS
5.0000 mg | ORAL_TABLET | ORAL | Status: DC
  Administered 2022-02-02 (×2): 5 mg via ORAL
  Filled 2022-02-02 (×2): qty 1

## 2022-02-02 NOTE — Evaluation (Signed)
Physical Therapy Evaluation Patient Details Name: Michael [name] MRN: [id number] DOB: 1933/04/15 Today's Date: 02/02/2022  History of Present Illness  88 yo male admitted 12/13 after fall down stairs putting on socks with C7 fx and Left 6th rib fx. PMhx:metastatic prostate CA, DM, HTN, CAD, COPD, HLD, GERD, Raynauds  Clinical Impression  Pt with flat affect with pain in left posterior chest at scapula border. Pt and daughter education for cervical precautions with need for max cues to adhere as pt with impulsive movement of bil UE and reaching/pulling for multiple items throughout mobility. Pt with max assist for mobility to achieve sitting and standing today with heavy posterior right lean and unable to tolerate OOB. Daughter in agreement for ST-SNF and pt will benefit from premedication for participate in therapy. Pt with decreased strength, safety, function, transfers and gait who will benefit from acute therapy to maximize mobility and independence to decrease burden of care.        Recommendations for follow up therapy are one component of a multi-disciplinary discharge planning process, led by the attending physician.  Recommendations may be updated based on patient status, additional functional criteria and insurance authorization.  Follow Up Recommendations Skilled nursing-short term rehab (<3 hours/day) Can patient physically be transported by private vehicle: No    Assistance Recommended at Discharge Frequent or constant Supervision/Assistance  Patient can return home with the following  Two people to help with walking and/or transfers;A lot of help with bathing/dressing/bathroom;Assistance with cooking/housework;Direct supervision/assist for medications management;Assist for transportation    Equipment Recommendations BSC/3in1  Recommendations for Other Services       Functional Status Assessment Patient has had a recent decline in their functional status and/or demonstrates  limited ability to make significant improvements in function in a reasonable and predictable amount of time     Precautions / Restrictions Precautions Precautions: Cervical;Fall Required Braces or Orthoses: Cervical Brace Cervical Brace: At all times;Hard collar      Mobility  Bed Mobility Overal bed mobility: Needs Assistance Bed Mobility: Rolling, Sidelying to Sit, Sit to Sidelying Rolling: Max assist Sidelying to sit: Max assist     Sit to sidelying: Max assist General bed mobility comments: physical assist with cues to bend knees to roll with assist to lift trunk, total assist to scoot hips fully to EOB. Max assist to lift legs and control trunk with return to sidelying    Transfers Overall transfer level: Needs assistance   Transfers: Sit to/from Stand Sit to Stand: Max assist           General transfer comment: max assist with belt from surface to stand. pt with impulsive and constant movement of LUE reaching for therapist, RW, surface with maintained heavy posterior right lean in standing. max assist for side stepping 2 steps toward [facility] for return to bed    Ambulation/Gait               General Gait Details: unable  Stairs            Wheelchair Mobility    Modified Rankin (Stroke Patients Only)       Balance Overall balance assessment: Needs assistance   Sitting balance-Leahy Scale: Poor Sitting balance - Comments: pt propping on rt elbow in sitting despite assist and cues, pt unable to maintain static sitting     Standing balance-Leahy Scale: Zero Standing balance comment: physical assist due to posterior bias  Pertinent Vitals/Pain Pain Assessment Pain Assessment: 0-10 Pain Score: 10-Worst pain ever Pain Location: left scapula Pain Descriptors / Indicators: Guarding, Grimacing Pain Intervention(s): Limited activity within patient's tolerance, Repositioned, RN gave pain meds during session     [place] expects to be discharged to:: Private residence Living Arrangements: Children Available Help at Discharge: Family;Home health;Available PRN/intermittently Type of Home: House Home Access: Stairs to enter Entrance Stairs-Rails: Right;Left Entrance Stairs-Number of Steps: 6 Alternate Level Stairs-Number of Steps: 16 Home Layout: Two level;Full bath on main level;Able to live on main level with bedroom/bathroom Home Equipment: Conservation officer, nature (2 wheels)      Prior Function Prior Level of Function : Needs assist             Mobility Comments: household ambulator with RW ADLs Comments: assist for bathing and dressing from family, family does iADLs     Hand Dominance        Extremity/Trunk Assessment   Upper Extremity Assessment Upper Extremity Assessment: Generalized weakness    Lower Extremity Assessment Lower Extremity Assessment: Generalized weakness    Cervical / Trunk Assessment Cervical / Trunk Assessment: Other exceptions Cervical / Trunk Exceptions: c7 fx with collar  Communication   Communication: Prefers language other than English  Cognition Arousal/Alertness: Awake/alert Behavior During Therapy: Restless Overall Cognitive Status: Difficult to assess                                 General Comments: pt impulsive with movement due to pain and twisting and shifting continuously despite education for movement and positioning        General Comments      Exercises     Assessment/Plan    PT Assessment Patient needs continued PT services  PT Problem List Decreased strength;Decreased mobility;Decreased safety awareness;Decreased activity tolerance;Decreased cognition;Decreased balance;Pain;Decreased knowledge of use of DME       PT Treatment Interventions DME instruction;Therapeutic activities;Cognitive remediation;Gait training;Therapeutic exercise;Patient/family education;Functional mobility  training;Balance training    PT Goals (Current goals can be found in the Care Plan section)  Acute Rehab PT Goals Patient Stated Goal: return to walking and home PT Goal Formulation: With patient/family Time For Goal Achievement: 02/16/22 Potential to Achieve Goals: Fair    Frequency Min 4X/week     Co-evaluation               AM-PAC PT "6 Clicks" Mobility  Outcome Measure Help needed turning from your back to your side while in a flat bed without using bedrails?: A Lot Help needed moving from lying on your back to sitting on the side of a flat bed without using bedrails?: A Lot Help needed moving to and from a bed to a chair (including a wheelchair)?: Total Help needed standing up from a chair using your arms (e.g., wheelchair or bedside chair)?: Total Help needed to walk in hospital room?: Total Help needed climbing 3-5 steps with a railing? : Total 6 Click Score: 8    End of Session Equipment Utilized During Treatment: Gait belt;Cervical collar Activity Tolerance: Patient limited by pain Patient left: in bed;with call bell/phone within reach;with family/visitor present Nurse Communication: Mobility status PT Visit Diagnosis: Other abnormalities of gait and mobility (R26.89);Muscle weakness (generalized) (M62.81);Unsteadiness on feet (R26.81)    Time: 6834-1962 PT Time Calculation (min) (ACUTE ONLY): 33 min   Charges:   PT Evaluation $PT Eval Moderate Complexity: 1 Mod PT Treatments $Therapeutic Activity: 8-22 mins  [place] Office: [place] 02/02/2022, 12:42 PM

## 2022-02-02 NOTE — Progress Notes (Signed)
OT Cancellation Note  Patient Details Name: SUFYAAN PALMA MRN: [id number] DOB: 1933-12-04   Cancelled Treatment:    Reason Eval/Treat Not Completed: Other (comment) (pt and daugther in law declined due to significant pain, is premedicated prior to attempt. In agreement to attempt at a later time or tomorrow, as able)  Shanon Payor, OTD OTR/L  02/02/22, 1:50 PM

## 2022-02-02 NOTE — Progress Notes (Addendum)
Subjective:  Summary: This is a 88 year old male with a past medical history of metastatic prostate cancer who presented to the emergency room after a fall. Patient found to have acute C7 fracture.  Patient admitted for further evaluation and management.  Patient's daughter [name] was in the room and translated for the patient.   Patient endorses that his pain has improved from yesterday. His current pain regimen improves his pain but states that the effect wares off in a couple of hours. He endorses bilateral shoulder pain, left index finger numbness, bilateral forearm and hand pain, and cervical spine pain. Left index finger numbness began this admission. He endorses pleuritic chest pain with difficulty coughing or sneezing. He reports his strength has improved. He wants further pain control before discharge. He has not been able to walk due to weakness and pain.   Per daughter [name], patient knows he has cancer. He is not aware of the extent of his disease. He knows he has bone pain due to the cancer; however he does not know that the pain is from metastasis but rather from radiation side effects. Per [place], oncology is aware that patient is not aware of metastasis or prognosis. Discussed with family that given cultural factors and family dynamics, they would prefer to defer conversation about his disease prognosis to his outpatient appointment on 12/29.  Objective:  Vital signs in last 24 hours: Vitals:   02/01/22 1925 02/01/22 2302 02/02/22 0404 02/02/22 0822  BP: 121/68 122/61 129/78 125/71  Pulse: (!) 103 100 94 94  Resp:    16  Temp: 97.9 F (36.6 C) 98.2 F (36.8 C) 98 F (36.7 C) 97.8 F (36.6 C)  TempSrc: Oral Oral Oral Oral  SpO2: 99% 95% 97% 98%  Weight:      Height:       Weight change:   Intake/Output Summary (Last 24 hours) at 02/02/2022 1126 Last data filed at 02/01/2022 2337 Gross per 24 hour  Intake 120 ml  Output 850 ml  Net -730 ml   Physical exam Gen: Thin  appearing man lying in bed with cervical collar on. Alert. Chest: Normal rate. No murmurs, rubs, or gallops. Tender to palpation of left upper chest. Pulm: Normal work of breathing. No wheezing, crackles, or rales. MSK: Good strength of bilateral lower extremities. Non-tender to palpation. Shoulder abduction to 170. Skin: Warm and dry. No bruising or wounds. Back: Some midline tenderness noted to thoracic region.  Assessment/Plan: Michael [name] is a 88 y.o. with a past medical history of metastatic prostate cancer to the bladder, rectum, thoracic spine, epidural spine (T6, T10) and lumbar spine, hypertension, hyperlipidemia, type 2 diabetes, and Raynaud's who presents with a mechanical fall this morning with C7 cervical fracture.    C7 fracture 2/2 mechanical fall Patient with no history of falls presenting with fall. No clear prodrome before fall, no loss of consciousness, patient denies hitting his head.  He denied any vertigo, and was not able to endorse any specific etiology for the fall. New medication of oxycodone '5mg'$  q4hr PRN can contribute, though he declined dizziness or drowsiness before the fall. Initial imaging showing acute C7 fracture, remaining osseous findings very likely due to metastasis as below. Neurosurgery consulted and recommended c-collar in place for 3 to 4 weeks until outpatient clinic follow-up. Given poor pain control, will adjust pain regimen. Patient also requesting topical pain management. -Neurosurgery following and appreciate further reccs. -Pain control: Tylenol 1000 mg q8hrs and Oxycodone '5mg'$  q4hrs,  and PRN Oxycodone '5mg'$  q6hrs for moderate pain. Start Voltaren PRN. Can escalate pain regimen if needed. -PT/OT consult, pending SNF -BMP tomorrow   Metastatic Prostate Cancer Patient was diagnosed with prostate cancer in 2018. He has metastatic disease to the bladder, rectum, left hip, lumbar and thoracic area, and left shoulder. He has pathologic fractures of  left posterior 6th rib and T12. He is on Leuprorelin injections and Enzalutamide '40mg'$  BID. He is also receiving iron transfusions. 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 He is status post XRT to spine for treatment of metastatic disease.  Currently meets with outpatient palliative care. His home pain management includes Tylenol '500mg'$  q4hr PRN, Ibuprofen '200mg'$  q6hr PRN, and Oxycodone '5mg'$  q4hr PRN. Will get CBC to assess H/H, admit 9.7/30.3. Given patient's pleuritic chest pain from pathologic left 6th posterior rib fracture, will order incentive spirometry to reduce risk of lung infection. Per family, patient does not know the extent of his metastatic disease or his prognosis. Will discuss with oncologist about prognosis, patient's understanding of his health, iron infusion status, and reschedule appointment. -Pain management as above -Oncologist Dr. Rogue Bussing is aware, appreciate recommendations -Palliative outpatient -Incentive spirometry  Iron Deficiency Anemia Patient has been on oral iron for supplementation. He was taking Niferex '150mg'$  daily. H/H on admission 9.7/30.3. -Iron infusion ordered -Ferritin, iron, and TIBC ordered   T2DM Patient's last A1C was 7.7 (01/27/22). His home medications are Glimepiride '2mg'$  daily, Metformin '1000mg'$  daily, and Pioglitazone '30mg'$  daily. -Hold home medications -SSI while inpatient -CBG   Raynaud Disease Chronic, patient is taking Nifedipine '30mg'$  daily. -Continue home medications   Hyperlipidemia Chronic, patient is taking Lipitor '10mg'$  daily. -Continue home medications   CAD Chronic, patient is taking Metoprolol Succinate '25mg'$  daily. -Continue home medications   BPH Chronic, patient is taking Mirabegron ER '25mg'$  daily. -Continue home medications   LOS: 0 days   Leonette Nutting, Medical Student 02/02/2022, 11:26 AM  Attestation for Student Documentation:  I personally was present and performed  or re-performed the history, physical exam and medical decision-making activities of this service and have verified that the service and findings are accurately documented in the student's note.  Leigh Aurora, DO 02/02/2022, 2:55 PM

## 2022-02-02 NOTE — Telephone Encounter (Signed)
I was finally able to speak to patient's daughter. Did inform her that he meets the criteria for hospice. However she wants the patient get to rehab [knowing limited potential]; continue radiation for pain control. I will inform radiation oncology Dr. Donella Stade at [facility].  Patient will follow-up with me in couple weeks. GB

## 2022-02-02 NOTE — Consult Note (Signed)
Palliative Care Consult Note                                  Date: 02/02/2022   Patient Name: Michael [name]  DOB: [birth year]  MRN: [id number]  Age / Sex: 88 y.o., male  PCP: Michael Harrier, MD Referring Physician: Charise Killian, MD  Reason for Consultation: Establishing goals of care  HPI/Patient Profile: 88 y.o. male  with  metastatic prostate cancer to the bladder, rectum, spine, and epidural space.  Also with PMH of hypertension, hyperlipidemia, type 2 diabetes, and Raynaud's.  He presented to [facility] ED on 02/01/2022 with a mechanical fall at home.  He was on the second floor of his home when he slipped and fell down 20 stairs.  He did not have loss of consciousness or altered mental status after the fall.  In the ED, he complained of new pain in his neck and bilateral shoulders as well as left chest, axilla area.  Imaging revealed C7 fracture as well as fracture of the left posterior sixth rib. Admitted to Internal Medicine Service.  Palliative Medicine was consulted for goals of care.   Past Medical History:  Diagnosis Date   Anemia    iron deficiency   Aortic atherosclerosis (HCC)    Bilateral carotid artery disease (HCC)    Mild plaque formation without obstructive disease noted on carotid Doppler.   Diabetes mellitus without complication ([place])    Essential hypertension    GERD (gastroesophageal reflux disease)    Hyperlipidemia    Hypertension    Left carotid artery stenosis    Nonobstructive Coronary artery disease    a. Previous cardiac catheterization at [facility] in 30-Apr-2008. The patient was told about 2 blockages which did not require revascularization; b. 01/2015 Cath: LM nl, LAD 10p/m, 55md, D2 30ost, LCX nl, RCA min irregs, EF 55-65%; b. 12/2018 MV: No ischemia/infarct. CT images w/ mod 3 vessel Cor Ca2+ and mild-mod Ao atherosclerosis.   [place] [facility] [place] [place] 11, 2019  Raynaud disease    a. Managed w/ nifedipine.     Subjective:   I have reviewed medical records including progress notes, labs and imaging.  I met with patient and his daughter/Michael [name] at bedside to discuss diagnosis, prognosis, GOC, EOL wishes, disposition, and options.  Patient speaks GMali an IPanamadialect. Michael [name] speaks fluent EVanuatu Patient prefers to use daughter for interpretation. I had him sign a form for waiver of interpreter services. I used the language line (([id number] to complete the form.   [facility]  I re-introduced Palliative Medicine as specialized medical care for people living with serious illness. It focuses on providing relief from the symptoms and stress of a serious illness.   Created space and opportunity for patient and family to express thoughts regarding current medical situation.Values and goals of care important to patient and family were discussed.  Questions and concerns addressed. Daughter was given PMT contact info and will call if needed.    Life Review: [name]  [place]  His wife passed away in 203-11-13  Michael [name] is his only living child; his son is deceased.   Functional Status: [place], son[place]in[place]law, and 133yo grandson. At baseline, he is ambulatory with a walker but  needs supervision for safety. Michael [name] reports his functional status has declined since September, after he was hospitalized with severe sepsis secondary to ESBL E.coli UTI and also acute blood loss anemia. Per Michael [name], he has low energy and eating has decreased by 50%.    Goals: To keep him at home as much as possible. Also to have adequate pain control.  Discussion: We discussed patient's current medical situation and what it means in the larger context of his ongoing co-morbidities. We discussed the natural disease trajectory of  advanced cancer, reviewing that Michael [name] has widespread osseous disease. Michael [name] understands that metastatic cancer is a non-curable illness and that the intent of treatment is palliative (to reduce symptom burden and increase survival time).   Michael [name] reports that her father also understands he has a terminal illness, but that they don't wish to dwell on this as it doesn't change his situation. Michael [name] shares they both cried when upon finding out the cancer had spread to his bones (PET scan February 2023 showed progression of disease with bone metastases).  Michael [name] reports her father's pain is not well-controlled. We discussed option of long-acting pain medication and she is agreeable.   Provided brief education on the philosophy and benefits of hospice care. Discussed that it offers a holistic approach to care in the setting of advanced illness/disease, and is about supporting the patient where they are allowing the natural course to occur. Discussed that hospice can provide symptom management and help keep patient at home (and out of the hospital). Discussed that treatment with XRT and medications (Xtandi and Eligard) would not be continued under hospice.   I completed a MOST form today. The patient and family outlined their wishes for the following treatment decisions:  Cardiopulmonary Resuscitation: Do Not Attempt Resuscitation (DNR/No CPR)  Medical Interventions: Limited Additional Interventions: Use medical treatment, IV fluids and cardiac monitoring as indicated, DO NOT USE intubation or mechanical ventilation. May consider use of less invasive airway support such as BiPAP or CPAP. Also provide comfort measures. Transfer to the hospital if indicated. Avoid intensive care.   Antibiotics: Antibiotics if indicated  IV Fluids: IV fluids for a defined trial period  Feeding Tube: Feeding tube for a defined trial period     Review of Systems  Musculoskeletal:  Positive for neck pain.    Objective:    Primary Diagnoses: Present on Admission:  C7 cervical fracture [facility])   Physical Exam Vitals reviewed.  Constitutional:      General: He is not in acute distress.    Interventions: Cervical collar in place.     Comments: Frail and chronically ill-appearing  Pulmonary:     Effort: Pulmonary effort is normal.  Neurological:     Mental Status: He is alert and oriented to person, place, and time.     Vital Signs:  BP 125/71 (BP Location: Right Arm)   Pulse 94   Temp 97.8 F (36.6 C) (Oral)   Resp 16   Ht _0  (1.676 m)   Wt 54.4 kg   SpO2 98%   BMI 19.37 kg/m   Palliative Assessment/Data: PPS 40%     Assessment & Plan:   SUMMARY OF RECOMMENDATIONS   DNR/DNI as previously documented Incentive spirometry (already ordered by primary team) Continue outpatient palliative at discharge MOST form completed and will be scanned into EMR  Primary Decision Maker: PATIENT, with support from daughter  Symptom Management:  Recommend starting Oxycontin 10 mg BID (titrate up as needed) Oxycodone IR every  4 hours as needed for breakthrough pain Consider addition of steroids for bone pain - dexamethasone 4 mg daily Agree with Voltaren 1% topical gel 4 times daily to bilateral elbows  Prognosis:  Unable to determine, he is likely hospice eligible but patient/daughter are not ready  Discharge Planning:  [place] for rehab with Palliative care service follow-up    Discussed with: Dr. Johnney Ou   Thank you for allowing [place] to participate in the care of [place]   Signed by: Elie Confer, NP Palliative Medicine Team  Team Phone # [phone]  For individual providers, please see AMION

## 2022-02-02 NOTE — Progress Notes (Signed)
After PT evaluation, and SNF recommendation, IMTS team went to go speak with the daughter about patient's poor prognosis.  I spoke with patient's oncologist about patient's prognosis, and patient's oncologist agrees that patient would be a good candidate for hospice.  Patient's daughter understands patient's poor prognosis and wants to move forward with SNF.  Patient's daughters explanation is that she would like the patient to get stronger, and then be able to move home.  Hospice was discussed with patient's daughter, who states that she would like information about hospice, and potentially proceed with this after patient gets stronger from SNF.  I do ask her if we can tell the patient about his complete prognosis, but patient's daughter defers till next visit with oncologist Dr.Brahmanday on 02/17/2022.  Patient daughter states that at that time, the oncologist, and the daughter will give the patient his prognosis.  At this point, we will proceed with SNF placement and good pain control.  I spoke with the patient myself, and he himself spoke to me in his native language which I understood, which he states he would like to go to SNF and get stronger.  Will also reach out to social worker to give patient's daughter hospice information.

## 2022-02-02 NOTE — Plan of Care (Signed)
  Problem: Coping: Goal: Ability to adjust to condition or change in health will improve Outcome: Progressing   Problem: Metabolic: Goal: Ability to maintain appropriate glucose levels will improve Outcome: Progressing

## 2022-02-03 ENCOUNTER — Telehealth: Payer: Self-pay | Admitting: Internal Medicine

## 2022-02-03 DIAGNOSIS — C7951 Secondary malignant neoplasm of bone: Secondary | ICD-10-CM | POA: Diagnosis not present

## 2022-02-03 DIAGNOSIS — C61 Malignant neoplasm of prostate: Secondary | ICD-10-CM | POA: Diagnosis not present

## 2022-02-03 DIAGNOSIS — S12600A Unspecified displaced fracture of seventh cervical vertebra, initial encounter for closed fracture: Secondary | ICD-10-CM | POA: Diagnosis not present

## 2022-02-03 DIAGNOSIS — S12691A Other nondisplaced fracture of seventh cervical vertebra, initial encounter for closed fracture: Secondary | ICD-10-CM | POA: Diagnosis not present

## 2022-02-03 LAB — IRON AND TIBC
Iron: 39 ug/dL — ABNORMAL LOW (ref 45–182)
Saturation Ratios: 14 % — ABNORMAL LOW (ref 17.9–39.5)
TIBC: 270 ug/dL (ref 250–450)
UIBC: 231 ug/dL

## 2022-02-03 LAB — BASIC METABOLIC PANEL
Anion gap: 11 (ref 5–15)
BUN: 12 mg/dL (ref 8–23)
CO2: 23 mmol/L (ref 22–32)
Calcium: 8.7 mg/dL — ABNORMAL LOW (ref 8.9–10.3)
Chloride: 100 mmol/L (ref 98–111)
Creatinine, Ser: 1.03 mg/dL (ref 0.61–1.24)
GFR, Estimated: >60 mL/min (ref >60)
Glucose, Bld: 175 mg/dL — ABNORMAL HIGH (ref 70–99)
Potassium: 4.1 mmol/L (ref 3.5–5.1)
Sodium: 134 mmol/L — ABNORMAL LOW (ref 135–145)

## 2022-02-03 LAB — GLUCOSE, CAPILLARY
Glucose-Capillary: 287 mg/dL — ABNORMAL HIGH (ref 70–99)
Glucose-Capillary: 206 mg/dL — ABNORMAL HIGH (ref 70–99)
Glucose-Capillary: 214 mg/dL — ABNORMAL HIGH (ref 70–99)
Glucose-Capillary: 217 mg/dL — ABNORMAL HIGH (ref 70–99)

## 2022-02-03 LAB — CBC
HCT: 28.6 % — ABNORMAL LOW (ref 39.0–52.0)
Hemoglobin: 10 g/dL — ABNORMAL LOW (ref 13.0–17.0)
MCH: 32.3 pg (ref 26.0–34.0)
MCHC: 35 g/dL (ref 30.0–36.0)
MCV: 92.3 fL (ref 80.0–100.0)
Platelets: 224 10*3/uL (ref 150–400)
RBC: 3.1 MIL/uL — ABNORMAL LOW (ref 4.22–5.81)
RDW: 16.6 % — ABNORMAL HIGH (ref 11.5–15.5)
WBC: 5.7 10*3/uL (ref 4.0–10.5)
nRBC: 0 % (ref 0.0–0.2)

## 2022-02-03 LAB — FERRITIN: Ferritin: 591 ng/mL — ABNORMAL HIGH (ref 24–336)

## 2022-02-03 MED ORDER — SENNOSIDES-DOCUSATE SODIUM 8.6-50 MG PO TABS
1.0000 | ORAL_TABLET | Freq: Two times a day (BID) | ORAL | Status: DC
  Administered 2022-02-03 (×2): 1 via ORAL
  Filled 2022-02-03 (×3): qty 1

## 2022-02-03 MED ORDER — OXYCODONE HCL ER 10 MG PO T12A
10.0000 mg | EXTENDED_RELEASE_TABLET | Freq: Two times a day (BID) | ORAL | Status: DC
  Administered 2022-02-03 – 2022-02-08 (×11): 10 mg via ORAL
  Filled 2022-02-03 (×11): qty 1

## 2022-02-03 MED ORDER — OXYCODONE HCL 5 MG PO TABS
5.0000 mg | ORAL_TABLET | ORAL | Status: DC | PRN
  Administered 2022-02-07: 5 mg via ORAL
  Filled 2022-02-03: qty 1

## 2022-02-03 MED ORDER — POLYETHYLENE GLYCOL 3350 17 G PO PACK
17.0000 g | PACK | Freq: Every day | ORAL | Status: DC
  Administered 2022-02-03: 17 g via ORAL
  Filled 2022-02-03 (×2): qty 1

## 2022-02-03 NOTE — NC FL2 (Signed)
[place] LEVEL OF CARE FORM     IDENTIFICATION  Patient Name: Michael [name] Birthdate: Oct 14, 1933 Sex: male Admission Date (Current Location): 02/01/2022  [facility] and [place] Number:  Herbalist and Address:  [facility] [place], [place] [address], [place], [place] [address]      Provider Number: [id number]  Attending Physician Name and Address:  Charise Killian, MD  Relative Name and Phone Number:       Current Level of Care: Hospital Recommended Level of Care: [place] Prior Approval Number:    Date Approved/Denied:   PASRR Number: [id number]  Discharge Plan: SNF    Current Diagnoses: Patient Active Problem List   Diagnosis Date Noted   C7 cervical fracture ([place]) 02/01/2022   Bacteremia due to Escherichia coli    Lactic acidosis 11/22/2021   Hypomagnesemia 11/20/2021   Syncope and collapse 11/19/2021   Pneumonia due to COVID-19 virus 11/19/2021   Hematuria 11/19/2021   ABLA (acute blood loss anemia) 11/19/2021   Acute urinary retention 11/19/2021   Iron deficiency anemia 11/19/2021   Raynaud's disease without gangrene 03/28/2019   Atherosclerosis of native arteries of the extremities with ulceration ([place]) 03/28/2019   Severe sepsis ([place]) secondary to ESBL E. 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   Endophthalmitis, right eye 10/29/2017   Type 2 diabetes mellitus without complication, with long-term current use of insulin ([place]) 06/15/2017   Pure hypercholesterolemia 06/15/2017   Anemia, unspecified 16/60/6301   Complicated UTI (urinary tract infection) 05/17/2017   Protein-calorie malnutrition, severe 05/09/2017   Acute febrile illness 05/07/2017   Goals of care, counseling/discussion 04/21/2017   Primary prostate cancer with metastasis from prostate to other site [facility])    Retroperitoneal lymphadenopathy    Hydronephrosis of right kidney    Abdominal pain  04/11/2017   Type II diabetes mellitus, uncontrolled 04/05/2017   Coronary artery disease involving native coronary artery of native heart without angina pectoris 03/15/2015   Coronary artery disease involving native coronary artery with angina pectoris ([place]) 01/22/2015   Essential hypertension    Hyperlipidemia    Spermatocele 03/26/2012   Benign localized prostatic hyperplasia with lower urinary tract symptoms (LUTS) 03/22/2012   Candidiasis of urogenital sites 03/22/2012   ED (erectile dysfunction) of organic origin 03/22/2012   Elevated prostate specific antigen (PSA) 03/22/2012   Incomplete emptying of bladder 03/22/2012   Redundant prepuce and phimosis 03/22/2012   Urge incontinence 03/22/2012    Orientation RESPIRATION BLADDER Height & Weight     Self, Time, Situation, Place  Normal Continent, Indwelling catheter Weight: 120 lb (54.4 kg) Height:  '5\' 6"'$  (167.6 cm)  BEHAVIORAL SYMPTOMS/MOOD NEUROLOGICAL BOWEL NUTRITION STATUS      Continent    AMBULATORY STATUS COMMUNICATION OF NEEDS Skin   Extensive Assist                           Personal Care Assistance Level of Assistance  Bathing, Feeding, Dressing Bathing Assistance: Limited assistance Feeding assistance: Limited assistance Dressing Assistance: Limited assistance     Functional Limitations Info  Sight, Hearing, Speech Sight Info: Adequate Hearing Info: Adequate Speech Info: Adequate    SPECIAL CARE FACTORS FREQUENCY  PT (By licensed PT), OT (By licensed OT)     PT Frequency: 5x weekly OT Frequency: 5x weekly            Contractures Contractures Info: Not present  Additional Factors Info  Code Status, Allergies, Isolation Precautions Code Status Info: DNR Allergies Info: No known allergies     Isolation Precautions Info: OMDRO, ESBL     Current Medications (02/03/2022):  This is the current hospital active medication list Current Facility-Administered Medications  Medication Dose Route  Frequency Provider Last Rate Last Admin   acetaminophen (TYLENOL) tablet 1,000 mg  1,000 mg Oral Q8H Schindler, Amar, DO   1,000 mg at 02/03/22 0544   atorvastatin (LIPITOR) tablet 10 mg  10 mg Oral q1800 Idamae Schuller, MD   10 mg at 02/02/22 1724   bisacodyl (DULCOLAX) EC tablet 5 mg  5 mg Oral Daily PRN Idamae Schuller, MD       Chlorhexidine Gluconate Cloth 2 % PADS 6 each  6 each Topical Daily Charise Killian, MD   6 each at 02/02/22 0913   diclofenac Sodium (VOLTAREN) 1 % topical gel 2 g  2 g Topical QID Leigh Aurora, DO   2 g at 02/03/22 0904   enoxaparin (LOVENOX) injection 40 mg  40 mg Subcutaneous Q24H Idamae Schuller, MD   40 mg at 02/02/22 1645   enzalutamide (XTANDI) tablet 80 mg  80 mg Oral Daily Charise Killian, MD   80 mg at 02/02/22 0917   feeding supplement (GLUCERNA SHAKE) (GLUCERNA SHAKE) liquid 237 mL  237 mL Oral BID BM Charise Killian, MD       insulin aspart (novoLOG) injection 0-15 Units  0-15 Units Subcutaneous TID WC Idamae Schuller, MD   8 Units at 02/03/22 0902   iron polysaccharides (NIFEREX) capsule 150 mg  150 mg Oral Daily Idamae Schuller, MD   150 mg at 02/03/22 0858   iron sucrose (VENOFER) 200 mg in sodium chloride 0.9 % 100 mL IVPB  200 mg Intravenous Once Gaylan Gerold, DO       metoprolol succinate (TOPROL-XL) 24 hr tablet 25 mg  25 mg Oral Daily Idamae Schuller, MD   25 mg at 02/03/22 0858   mirabegron ER (MYRBETRIQ) tablet 25 mg  25 mg Oral Daily Idamae Schuller, MD   25 mg at 02/03/22 0859   multivitamin with minerals tablet 1 tablet  1 tablet Oral Daily Idamae Schuller, MD   1 tablet at 02/03/22 0900   NIFEdipine (PROCARDIA-XL/NIFEDICAL-XL) 24 hr tablet 30 mg  30 mg Oral Daily Idamae Schuller, MD   30 mg at 02/03/22 6010   oxyCODONE (Oxy IR/ROXICODONE) immediate release tablet 5 mg  5 mg Oral Q4H PRN Leigh Aurora, DO       oxyCODONE (OXYCONTIN) 12 hr tablet 10 mg  10 mg Oral Q12H Tax, Amar, DO   10 mg at 02/03/22 0900   pantoprazole (PROTONIX) EC tablet 40 mg  40 mg Oral Daily Idamae Schuller, MD   40 mg  at 02/03/22 0900   senna-docusate (Senokot-S) tablet 1 tablet  1 tablet Oral QHS PRN Idamae Schuller, MD         Discharge Medications: Please see discharge summary for a list of discharge medications.  Relevant Imaging Results:  Relevant Lab Results:   Additional Information SSN: [id number]  Archie Endo, LCSW

## 2022-02-03 NOTE — Inpatient Diabetes Management (Signed)
Inpatient Diabetes Program Recommendations  AACE/ADA: New Consensus Statement on Inpatient Glycemic Control (2015)  Target Ranges:  Prepandial:   less than 140 mg/dL      Peak postprandial:   less than 180 mg/dL (1-2 hours)      Critically ill patients:  140 - 180 mg/dL   Lab Results  Component Value Date   GLUCAP 206 (H) 02/03/2022   HGBA1C 7.7 (H) 01/27/2022    Review of Glycemic Control  Latest Reference Range & Units 02/03/22 08:23 02/03/22 11:33  Glucose-Capillary 70 - 99 mg/dL 287 (H) 206 (H)  (H): Data is abnormally high  Diabetes history: DM2 Outpatient Diabetes medications:  Amaryl 2 mg QD Metformin 1000 mg BID Actos 30 mg QD Current orders for Inpatient glycemic control:  Novolog 0-15 units TID  Inpatient Diabetes Program Recommendations:    Novolog 2 units TID with meals if consumes at least 50%.  Will continue to follow while inpatient.  Thank you, Reche Dixon, MSN, [place] Diabetes Coordinator Inpatient Diabetes Program [phone] (team pager from 8a-5p)

## 2022-02-03 NOTE — Telephone Encounter (Signed)
I spoke to patient's daughter again this morning-discussed hospice versus continuing current scope of care.  Daughter is interested in continuing current scope of care-rehab; continue radiation.  Discussed with Dr. Donella Stade.  I recommended the daughter to call radiation oncology office regarding-follow-up appointments; based on patient's SNF placement/other logistical issues including transportation.

## 2022-02-03 NOTE — Plan of Care (Signed)

## 2022-02-03 NOTE — Progress Notes (Signed)
Physical Therapy Treatment Patient Details Name: Michael [name] MRN: [id number] DOB: Apr 23, 1933 Today's Date: 02/03/2022   History of Present Illness 88 yo male admitted 12/13 after fall down stairs putting on socks with C7 fx and Left 6th rib fx. PMhx:metastatic prostate CA, DM, HTN, CAD, COPD, HLD, GERD, Raynauds    PT Comments    Pt with significant improvement in pain control and tolerance for mobility this session with pt able to transition to sitting, standing, limited gait and OOB to chair. Daughter remains agreeable to D/C plan and interpreting for pt throughout session. Pt requires increased time and cues but progressing.     Recommendations for follow up therapy are one component of a multi-disciplinary discharge planning process, led by the attending physician.  Recommendations may be updated based on patient status, additional functional criteria and insurance authorization.  Follow Up Recommendations  Skilled nursing-short term rehab (<3 hours/day) Can patient physically be transported by private vehicle: No   Assistance Recommended at Discharge Frequent or constant Supervision/Assistance  Patient can return home with the following Assistance with cooking/housework;Direct supervision/assist for medications management;Assist for transportation;A lot of help with walking and/or transfers;A lot of help with bathing/dressing/bathroom   Equipment Recommendations  BSC/3in1    Recommendations for Other Services       Precautions / Restrictions Precautions Precautions: Cervical;Fall Required Braces or Orthoses: Cervical Brace Cervical Brace: At all times;Hard collar     Mobility  Bed Mobility Overal bed mobility: Needs Assistance Bed Mobility: Rolling, Sidelying to Sit Rolling: Min assist Sidelying to sit: Min assist       General bed mobility comments: cues for bending knees, sequence and assist to lift trunk from surface. Pt able to scoot to EOB with cues  without physical assist    Transfers Overall transfer level: Needs assistance   Transfers: Sit to/from Stand, Bed to chair/wheelchair/BSC Sit to Stand: Min assist Stand pivot transfers: Min assist         General transfer comment: min assist to rise from bed and recliner, cues for hand placement, sequence and safety. Min assist with RW to pivot from bed to recliner    Ambulation/Gait Ambulation/Gait assistance: Min assist Gait Distance (Feet): 8 Feet Assistive device: Rolling walker (2 wheels) Gait Pattern/deviations: Step-through pattern, Decreased stride length   Gait velocity interpretation: <1.8 ft/sec, indicate of risk for recurrent falls   General Gait Details: pt able to walk to end of bed and back but denied further distance   Stairs             Wheelchair Mobility    Modified Rankin (Stroke Patients Only)       Balance Overall balance assessment: Needs assistance   Sitting balance-Leahy Scale: Fair Sitting balance - Comments: guarding with cues for posture EOB and in chair with need for pillows to prop to midline in chair   Standing balance support: Bilateral upper extremity supported, Reliant on assistive device for balance Standing balance-Leahy Scale: Poor Standing balance comment: bil UE support on RW                            Cognition Arousal/Alertness: Awake/alert Behavior During Therapy: Flat affect Overall Cognitive Status: Difficult to assess                                 General Comments: pt following commands but resistant at times due  to pain, increased time        Exercises      General Comments        Pertinent Vitals/Pain Pain Assessment Pain Score: 6  Pain Location: left posterior rib Pain Descriptors / Indicators: Guarding, Grimacing Pain Intervention(s): Limited activity within patient's tolerance    Home Living                          Prior Function            PT  Goals (current goals can now be found in the care plan section) Progress towards PT goals: Progressing toward goals    Frequency    Min 4X/week      PT Plan Current plan remains appropriate    Co-evaluation              AM-PAC PT "6 Clicks" Mobility   Outcome Measure  Help needed turning from your back to your side while in a flat bed without using bedrails?: A Little Help needed moving from lying on your back to sitting on the side of a flat bed without using bedrails?: A Little Help needed moving to and from a bed to a chair (including a wheelchair)?: A Little Help needed standing up from a chair using your arms (e.g., wheelchair or bedside chair)?: A Little Help needed to walk in hospital room?: A Lot Help needed climbing 3-5 steps with a railing? : Total 6 Click Score: 15    End of Session Equipment Utilized During Treatment: Gait belt;Cervical collar Activity Tolerance: Patient limited by pain Patient left: in chair;with call bell/phone within reach;with family/visitor present;with nursing/sitter in room Nurse Communication: Mobility status;Precautions PT Visit Diagnosis: Other abnormalities of gait and mobility (R26.89);Muscle weakness (generalized) (M62.81);Unsteadiness on feet (R26.81)     Time: 1025-8527 PT Time Calculation (min) (ACUTE ONLY): 26 min  Charges:  $Therapeutic Activity: 23-37 mins                     Bayard Males, PT Acute Rehabilitation Services Office: [phone]    Michael [name] 02/03/2022, 11:51 AM

## 2022-02-03 NOTE — Progress Notes (Signed)
CSW completed FL2 and faxed patient's clinical information out for review to obtain bed offers.  Madilyn Fireman, MSW, LCSW Transitions of Care  Clinical Social Worker II [phone]

## 2022-02-03 NOTE — Progress Notes (Addendum)
HD#2 Subjective:   Summary: This is a 88 year old male with a past medical history of metastatic prostate cancer who presented to the emergency room after a fall. Patient found to have acute C7 fracture.  Patient admitted for further evaluation and management.   Overnight events: No overnight events    Patient evaluated bedside.  Patient spoken to in his native language by me.  Patient states he is still having bilateral shoulder pain as well as bilateral elbow pain.  He states that the Voltaren gel is not effective.  Of note, patient did not get his night medication, and this could be contributing.  Will plan to give patient medicine now.  Patient is still agreeable to go to SNF.  Objective:  Vital signs in last 24 hours: Vitals:   02/02/22 1641 02/02/22 2009 02/03/22 0344 02/03/22 0817  BP: 122/67 137/74 130/71 (!) 149/82  Pulse: 88 95 74 (!) 101  Resp: '15 17 15 16  '$ Temp: (!) 97.5 F (36.4 C) 98.1 F (36.7 C) 98 F (36.7 C) 98 F (36.7 C)  TempSrc: Oral Oral Oral Oral  SpO2: 97% 97% 98% 100%  Weight:      Height:       Supplemental O2: Room Air SpO2: 100 %   Physical Exam:  Constitutional: Sitting horizontally in bed, uncomfortable, in some distress Neck: Collar in place Cardiovascular: regular rate and rhythm, no m/r/g Pulmonary/Chest: normal work of breathing on room air, lungs clear to auscultation bilaterally MSK: Tenderness to palpation on bilateral shoulders, full range of motion of bilateral upper and lower extremities Skin: warm and dry  Filed Weights   02/01/22 0859  Weight: 54.4 kg     Intake/Output Summary (Last 24 hours) at 02/03/2022 1238 Last data filed at 02/03/2022 1120 Gross per 24 hour  Intake 240 ml  Output 250 ml  Net -10 ml   Net IO Since Admission: -740 mL [02/03/22 1238]  Pertinent Labs:    Latest Ref Rng & Units 02/03/2022    6:09 AM 02/01/2022    9:27 AM 02/01/2022    8:35 AM  CBC  WBC 4.0 - 10.5 K/uL 5.7   5.7   Hemoglobin  13.0 - 17.0 g/dL 10.0  9.9  9.7   Hematocrit 39.0 - 52.0 % 28.6  29.0  30.3   Platelets 150 - 400 K/uL 224   224        Latest Ref Rng & Units 02/03/2022    6:09 AM 02/01/2022    9:27 AM 02/01/2022    8:35 AM  CMP  Glucose 70 - 99 mg/dL 175  175  173   BUN 8 - 23 mg/dL '12  15  12   '$ Creatinine 0.61 - 1.24 mg/dL 1.03  0.90  1.03   Sodium 135 - 145 mmol/L 134  134  132   Potassium 3.5 - 5.1 mmol/L 4.1  4.1  4.0   Chloride 98 - 111 mmol/L 100  100  99   CO2 22 - 32 mmol/L 23   22   Calcium 8.9 - 10.3 mg/dL 8.7   8.8     Imaging: No results found.  Assessment/Plan:   Principal Problem:   C7 cervical fracture Michael [name])   Patient Summary: RENDER MARLEY is a 88 y.o. male with a past medical history of metastatic prostate cancer who presented to the emergency room after a fall. Patient found to have acute C7 fracture.  Patient admitted for further evaluation and  management.  #Acute C7 fracture secondary to mechanical fall Patient states he is still in pain.  Of note, patient did not get his oxycodone 10 mg last night.  This could be contributing.  Patient does have full range of motion to his upper and lower extremities.  Patient still in c-collar.  Patient states that the base of his neck is given him the most pain.  He denies any pain in his legs.  Neurosurgery to evaluate in 3 to 4 weeks.  Patient to remain in c-collar until then.  On exam, patient does have 5/5 strength to his bilateral upper and lower extremities.  Patient also has sensation intact to his bilateral upper and lower extremities.  Patient does have tenderness on his bilateral shoulders and neck. -Continue with SNF placement -Pain control with 1000 mg Tylenol every 8 hours -Continue Oxycontin 10 mg every 12 hours as well as oxycodone 5 mg every 4 hours as needed -Voltaren gel  #Metastatic prostate cancer Patient diagnosed with prostate cancer in 2018.  He has metastatic disease to the bladder, rectum, left hip,  lumbar spine, thoracic spine, and left shoulder.  There is also some pathologic fractures noted to left ribs.  Patient currently on leuprorelin and Enzalutamide.  After long discussions with daughter, daughter is thinking about going towards hospice, and revealing diagnosis and prognosis to patient.  Patient did see palliative care here, and patient's daughter would like patient to get stronger before going towards hospice.  Hospice information given to daughter. Patient's daughter would like patient to get stronger, and patient is agreeable to going to SNF.  SNF placement is still being assessed.  Until then we will plan for good pain control.  Oncologist is aware of all discussions.   -Multimodal pain control -Palliative following -Continue Enzalutamide and leuprorelin   #Iron deficiency anemia Patient's iron deficit from September is 931.  Patient's sleep study showing decreased iron saturations and decreased iron.  Patient does have elevated ferritin.  Will continue with iron supplements.  Will give iron infusion today. -Continue to monitor CBC -Iron infusion today -Continue oral iron '150mg'$  daily   #Type 2 diabetes mellitus Patient currently on sliding scale insulin.  Holding home glimepiride, metformin, and pioglitazone.  Blood glucose measuring between 134-198.  Will continue with sliding scale insulin for now. -Continue sliding scale insulin -Continue to monitor blood glucose levels  #Raynaud Disease Chronic, patient is taking Nifedipine '30mg'$  daily. -Continue home medications   #Hyperlipidemia Chronic, patient is taking Lipitor '10mg'$  daily. -Continue home medications   #CAD Chronic, patient is taking Metoprolol Succinate '25mg'$  daily. -Continue home medications   #BPH Chronic, patient is taking Mirabegron ER '25mg'$  daily. -Continue home medications  . Diet: Vegetarian diet IVF: None, VTE: Enoxaparin Code: DNR/DNI PT/OT recs: SNF for Subacute PT  Family Update: Son-in-law  updated at bedside   Dispo: Anticipated discharge to Skilled nursing facility in 2 days pending SNF placement.   [place] [facility] Resident PGY-1 [phone] Please contact the on call pager after 5 pm and on weekends at [phone].

## 2022-02-03 NOTE — Progress Notes (Signed)
OT Cancellation Note  Patient Details Name: TRYGG MANTZ MRN: [id number] DOB: 02-17-1934   Cancelled Treatment:    Reason Eval/Treat Not Completed: Patient declined, no reason specified. Attempted 2x; pt asking to wait 2 hours as he has just gotten back to bed and on return asking to wait until 1600. Will return as schedule allows. Daughter following along with pt request.   Elder Cyphers, OTR/L [facility] Acute Rehabilitation Office: [phone]   Magnus Ivan 02/03/2022, 1:32 PM

## 2022-02-03 NOTE — Evaluation (Signed)
Occupational Therapy Evaluation Patient Details Name: Michael [name] MRN: [id number] DOB: [birth year] Today's Date: 02/03/2022   History of Present Illness 88 yo male admitted 12/13 after fall down stairs putting on socks with C7 fx and Left 6th rib fx. PMhx:metastatic prostate CA, DM, HTN, CAD, COPD, HLD, GERD, Raynauds   Clinical Impression   PTA, pt lived with daughter and her husband who assisted with LB ADL, bathing, and IADL. Upon eval, pt performing LB ADL with up to max A and UB ADL with min A. Pt educated and demonstrating use of log roll technique with min-mod A and cues for technique. Pt washing face with min guard A. Using RW as daughter brought it to pt, but poor compliance, frequently letting go with one hand. Pt will continue to benefit from skilled OT services to optimize safety and independence with ADL and IADL.     Recommendations for follow up therapy are one component of a multi-disciplinary discharge planning process, led by the attending physician.  Recommendations may be updated based on patient status, additional functional criteria and insurance authorization.   Follow Up Recommendations  Skilled nursing-short term rehab (<3 hours/day)     Assistance Recommended at Discharge Frequent or constant Supervision/Assistance  Patient can return home with the following A little help with walking and/or transfers;A little help with bathing/dressing/bathroom;Assistance with cooking/housework;Direct supervision/assist for medications management;Direct supervision/assist for financial management;Assist for transportation;Help with stairs or ramp for entrance    Functional Status Assessment  Patient has had a recent decline in their functional status and demonstrates the ability to make significant improvements in function in a reasonable and predictable amount of time.  Equipment Recommendations  None recommended by OT    Recommendations for Other Services        Precautions / Restrictions Precautions Precautions: Cervical;Fall Required Braces or Orthoses: Cervical Brace Cervical Brace: At all times;Hard collar Restrictions Weight Bearing Restrictions: No      Mobility Bed Mobility Overal bed mobility: Needs Assistance Bed Mobility: Rolling, Sidelying to Sit, Sit to Sidelying Rolling: Min assist Sidelying to sit: Min assist     Sit to sidelying: Mod assist General bed mobility comments: cues for bending knees, sequence and assist to lift trunk from surface. Pt able to scoot to EOB with cues without physical assist    Transfers Overall transfer level: Needs assistance Equipment used: Rolling walker (2 wheels) Transfers: Sit to/from Stand Sit to Stand: Min assist           General transfer comment: min assist to rise from bed, cues for hand placement      Balance Overall balance assessment: Needs assistance Sitting-balance support: No upper extremity supported, Feet supported, Bilateral upper extremity supported Sitting balance-Leahy Scale: Fair Sitting balance - Comments: guarding with cues for posture EOB. After functional mobility, pt required uip to min A for static sitting EOB   Standing balance support: Bilateral upper extremity supported, Reliant on assistive device for balance Standing balance-Leahy Scale: Poor Standing balance comment: bil UE support on RW                           ADL either performed or assessed with clinical judgement   ADL Overall ADL's : Needs assistance/impaired Eating/Feeding: Set up;Sitting   Grooming: Wash/dry face;Min guard;Standing Grooming Details (indicate cue type and reason): Min guard A for safety Upper Body Bathing: Sitting;Supervision/ safety   Lower Body Bathing: Moderate assistance;Sit to/from stand   Upper Body Dressing :  Moderate assistance;Sitting;Cueing for compensatory techniques   Lower Body Dressing: Sit to/from stand;Maximal assistance Lower Body  Dressing Details (indicate cue type and reason): to don socks and pull up pants Toilet Transfer: Min guard;Minimal assistance;Rolling walker (2 wheels);Ambulation;Comfort height toilet           Functional mobility during ADLs: Min guard;Minimal assistance;Rolling walker (2 wheels) General ADL Comments: Pt with intermittent letting go of RW during functional mobilty, sometimes in effort to hold onto furniture, but occasionally just letting go. Pt will benefit from continued education for safety     Vision Ability to See in Adequate Light: 0 Adequate Patient Visual Report: No change from baseline Vision Assessment?: No apparent visual deficits     Perception Perception Perception Tested?: No   Praxis Praxis Praxis tested?: Not tested    Pertinent Vitals/Pain Pain Assessment Pain Assessment: Faces Faces Pain Scale: Hurts even more Pain Location: left posterior rib, neck Pain Descriptors / Indicators: Guarding, Grimacing Pain Intervention(s): Limited activity within patient's tolerance, Monitored during session, Repositioned, Relaxation     Hand Dominance     Extremity/Trunk Assessment Upper Extremity Assessment Upper Extremity Assessment: Generalized weakness   Lower Extremity Assessment Lower Extremity Assessment: Generalized weakness   Cervical / Trunk Assessment Cervical / Trunk Assessment: Other exceptions Cervical / Trunk Exceptions: c7 fx with collar   Communication Communication Communication: Prefers language other than English   Cognition Arousal/Alertness: Awake/alert Behavior During Therapy: Flat affect Overall Cognitive Status: Difficult to assess                                 General Comments: Pt following commands; difficulty to assess due to language, but daughter reports cognition seems nearly normal     General Comments  daughter present and supportive. Interested in short term rehabilitation    Exercises     Shoulder  Instructions      Home Living Family/patient expects to be discharged to:: Private residence Living Arrangements: Children Available Help at Discharge: Family;Available PRN/intermittently Type of Home: House Home Access: Stairs to enter [employer] of Steps: 6 Entrance Stairs-Rails: Right;Left Home Layout: Two level;Full bath on main level;Able to live on main level with bedroom/bathroom Alternate Level Stairs-Number of Steps: 16 Alternate Level Stairs-Rails: Right Bathroom Shower/Tub: Teacher, early years/pre: Standard     Home Equipment: Conservation officer, nature (2 wheels);Shower seat          Prior Functioning/Environment Prior Level of Function : Needs assist             Mobility Comments: household ambulator with RW ADLs Comments: assist for bathing and dressing from family, family does iADLs        OT Problem List: Decreased strength;Decreased activity tolerance;Impaired balance (sitting and/or standing);Decreased safety awareness;Decreased knowledge of use of DME or AE;Decreased knowledge of precautions;Pain      OT Treatment/Interventions: Therapeutic exercise;Self-care/ADL training;DME and/or AE instruction;Patient/family education;Balance training;Therapeutic activities    OT Goals(Current goals can be found in the care plan section) Acute Rehab OT Goals Patient Stated Goal: no neck pain OT Goal Formulation: With patient Time For Goal Achievement: 02/17/22 Potential to Achieve Goals: Good  OT Frequency: Min 2X/week    Co-evaluation              AM-PAC OT "6 Clicks" Daily Activity     Outcome Measure Help from another person eating meals?: None Help from another person taking care of personal grooming?: A  Little Help from another person toileting, which includes using toliet, bedpan, or urinal?: A Lot Help from another person bathing (including washing, rinsing, drying)?: A Lot Help from another person to put on and taking off regular  upper body clothing?: A Little Help from another person to put on and taking off regular lower body clothing?: A Lot 6 Click Score: 16   End of Session Equipment Utilized During Treatment: Gait belt;Rolling walker (2 wheels);Cervical collar Nurse Communication: Mobility status  Activity Tolerance: Patient tolerated treatment well Patient left: in bed;with call bell/phone within reach;with family/visitor present  OT Visit Diagnosis: Unsteadiness on feet (R26.81);Muscle weakness (generalized) (M62.81);History of falling (Z91.81);Other abnormalities of gait and mobility (R26.89);Pain Pain - part of body:  (neck, Posterior L rib pain)                Time: 7673-4193 OT Time Calculation (min): 22 min Charges:  OT General Charges $OT Visit: 1 Visit OT Evaluation $OT Eval Moderate Complexity: 1 Mod  Elder Cyphers, OTR/L [facility] Acute Rehabilitation Office: [phone]   Magnus Ivan 02/03/2022, 5:24 PM

## 2022-02-04 DIAGNOSIS — S12691A Other nondisplaced fracture of seventh cervical vertebra, initial encounter for closed fracture: Secondary | ICD-10-CM | POA: Diagnosis not present

## 2022-02-04 DIAGNOSIS — S12600A Unspecified displaced fracture of seventh cervical vertebra, initial encounter for closed fracture: Secondary | ICD-10-CM | POA: Diagnosis not present

## 2022-02-04 LAB — BASIC METABOLIC PANEL
Anion gap: 12 (ref 5–15)
BUN: 12 mg/dL (ref 8–23)
CO2: 21 mmol/L — ABNORMAL LOW (ref 22–32)
Calcium: 8.6 mg/dL — ABNORMAL LOW (ref 8.9–10.3)
Chloride: 101 mmol/L (ref 98–111)
Creatinine, Ser: 0.8 mg/dL (ref 0.61–1.24)
GFR, Estimated: >60 mL/min (ref >60)
Glucose, Bld: 161 mg/dL — ABNORMAL HIGH (ref 70–99)
Potassium: 3.9 mmol/L (ref 3.5–5.1)
Sodium: 134 mmol/L — ABNORMAL LOW (ref 135–145)

## 2022-02-04 LAB — CBC
HCT: 28.9 % — ABNORMAL LOW (ref 39.0–52.0)
Hemoglobin: 9.8 g/dL — ABNORMAL LOW (ref 13.0–17.0)
MCH: 31.5 pg (ref 26.0–34.0)
MCHC: 33.9 g/dL (ref 30.0–36.0)
MCV: 92.9 fL (ref 80.0–100.0)
Platelets: 228 10*3/uL (ref 150–400)
RBC: 3.11 MIL/uL — ABNORMAL LOW (ref 4.22–5.81)
RDW: 16.5 % — ABNORMAL HIGH (ref 11.5–15.5)
WBC: 5 10*3/uL (ref 4.0–10.5)
nRBC: 0 % (ref 0.0–0.2)

## 2022-02-04 LAB — GLUCOSE, CAPILLARY
Glucose-Capillary: 261 mg/dL — ABNORMAL HIGH (ref 70–99)
Glucose-Capillary: 189 mg/dL — ABNORMAL HIGH (ref 70–99)
Glucose-Capillary: 145 mg/dL — ABNORMAL HIGH (ref 70–99)
Glucose-Capillary: 227 mg/dL — ABNORMAL HIGH (ref 70–99)

## 2022-02-04 MED ORDER — POLYETHYLENE GLYCOL 3350 17 G PO PACK
17.0000 g | PACK | Freq: Two times a day (BID) | ORAL | Status: DC
  Administered 2022-02-04 – 2022-02-08 (×9): 17 g via ORAL
  Filled 2022-02-04 (×6): qty 1

## 2022-02-04 MED ORDER — SENNOSIDES-DOCUSATE SODIUM 8.6-50 MG PO TABS
2.0000 | ORAL_TABLET | Freq: Two times a day (BID) | ORAL | Status: DC
  Administered 2022-02-04 – 2022-02-08 (×9): 2 via ORAL
  Filled 2022-02-04 (×8): qty 2

## 2022-02-04 MED ORDER — SODIUM CHLORIDE 0.9 % IV SOLN
200.0000 mg | Freq: Once | INTRAVENOUS | Status: AC
  Administered 2022-02-04: 200 mg via INTRAVENOUS
  Filled 2022-02-04: qty 10

## 2022-02-04 NOTE — Progress Notes (Signed)
HD#2 Subjective:   Summary: This is a 88 year old male with a past medical history of metastatic prostate cancer who presented to the emergency room after a fall. Patient found to have acute C7 fracture.  Patient admitted for further evaluation and management.   Overnight events: No overnight events    Patient evaluated bedside.  Patient states pain is better controlled.  He states he has not had a bowel movement in a couple days.  Patient has no other concerns.  Objective:  Vital signs in last 24 hours: Vitals:   02/03/22 0817 02/03/22 1546 02/03/22 2100 02/04/22 0324  BP: (!) 149/82 (!) 158/89 (!) 147/80 (!) 155/86  Pulse: (!) 101 96 75 (!) 101  Resp: '16 16 16   '$ Temp: 98 F (36.7 C) 97.9 F (36.6 C) 98 F (36.7 C) (!) 97.5 F (36.4 C)  TempSrc: Oral Oral Oral Oral  SpO2: 100% 99% 100% 100%  Weight:      Height:       Supplemental O2: Room Air SpO2: 100 %   Physical Exam:  Constitutional: Resting in bed in no acute distress Neck: Collar in place Cardiovascular: regular rate and rhythm, no m/r/g Pulmonary/Chest: normal work of breathing on room air, lungs clear to auscultation bilaterally MSK: Mild tenderness to left upper extremity.  Full range of motion to left upper extremity on shoulder abduction, external rotation, and internal rotation Skin: warm and dry  Filed Weights   02/01/22 0859  Weight: 54.4 kg     Intake/Output Summary (Last 24 hours) at 02/04/2022 0607 Last data filed at 02/03/2022 1834 Gross per 24 hour  Intake 947 ml  Output 525 ml  Net 422 ml    Net IO Since Admission: -308 mL [02/04/22 0607]  Pertinent Labs:    Latest Ref Rng & Units 02/04/2022    4:27 AM 02/03/2022    6:09 AM 02/01/2022    9:27 AM  CBC  WBC 4.0 - 10.5 K/uL 5.0  5.7    Hemoglobin 13.0 - 17.0 g/dL 9.8  10.0  9.9   Hematocrit 39.0 - 52.0 % 28.9  28.6  29.0   Platelets 150 - 400 K/uL 228  224         Latest Ref Rng & Units 02/04/2022    4:27 AM 02/03/2022     6:09 AM 02/01/2022    9:27 AM  CMP  Glucose 70 - 99 mg/dL 161  175  175   BUN 8 - 23 mg/dL '12  12  15   '$ Creatinine 0.61 - 1.24 mg/dL 0.80  1.03  0.90   Sodium 135 - 145 mmol/L 134  134  134   Potassium 3.5 - 5.1 mmol/L 3.9  4.1  4.1   Chloride 98 - 111 mmol/L 101  100  100   CO2 22 - 32 mmol/L 21  23    Calcium 8.9 - 10.3 mg/dL 8.6  8.7      Imaging: No results found.  Assessment/Plan:   Principal Problem:   C7 cervical fracture (HCC) Active Problems:   Prostate cancer metastatic to bone [facility])   Patient Summary: Michael [name] is a 88 y.o. male with a past medical history of metastatic prostate cancer who presented to the emergency room after a fall. Patient found to have acute C7 fracture.  Patient admitted for further evaluation and management.  #Acute C7 fracture secondary to mechanical fall Patient reports pain is better controlled.  Patient medication administered at  time.  He does report having more left upper extremity pain.  He denies any other concerns at this time.  He states that he has been up and moving with pain, but able to walk.  Patient has not had a bowel movement in the past few days, and states that he would like to go.  Patient is still in c-collar.  Neurosurgery to evaluate in 3 to 4 weeks.  On exam, patient does have 5/5 strength to bilateral upper and lower extremities.  He does have tenderness noted to his left upper extremity.  Left upper extremity with full range of motion -Continue with SNF placement -Pain control with 1000 mg Tylenol every 8 hours -Continue Oxycontin 10 mg every 12 hours as well as oxycodone 5 mg every 4 hours as needed -Voltaren gel -Increase bowel regimen today  #Metastatic prostate cancer Patient diagnosed with prostate cancer in 2018.  He has metastatic disease to the bladder, rectum, left hip, lumbar spine, thoracic spine, and left shoulder.  There is also some pathologic fractures noted to left ribs.  Patient currently on  leuprorelin and Enzalutamide.  Current plan is to continue with current treatment and after patient gets stronger from staff, oncologist and daughter will reveal prognosis to patient and after that they might go hospice route.  Oncologist is aware of all discussions. -Multimodal pain control -Palliative following -Continue Enzalutamide and leuprorelin -Pending SNF placement  #Iron deficiency anemia Patient CBC stable.  Continue with iron infusion and iron supplements -Monitor CBC -Infuse iron again today -Continue oral iron 150 mg daily  #Type 2 diabetes mellitus Patient currently on sliding scale insulin.  Holding home glimepiride, metformin, pioglitazone.  Patient currently on sliding scale insulin.  Holding home glimepiride, metformin, and pioglitazone.  Blood glucose measuring between 161-261. -Continue sliding scale insulin -Continue to monitor blood glucose levels   #Raynaud Disease Chronic, patient is taking Nifedipine '30mg'$  daily. -Continue home medications   #Hyperlipidemia Chronic, patient is taking Lipitor '10mg'$  daily. -Continue home medications   #CAD Chronic, patient is taking Metoprolol Succinate '25mg'$  daily. -Continue home medications   #BPH Chronic, patient is taking Mirabegron ER '25mg'$  daily. -Continue home medications  Diet: Vegetarian diet IVF: None, VTE: Enoxaparin Code: DNR/DNI PT/OT recs: SNF for Subacute PT  Family Update: Daughter updated at bedside   Dispo: Anticipated discharge to Skilled nursing facility in 2 days pending SNF placement.   [place] Internal Medicine Resident PGY-1 [phone] Please contact the on call pager after 5 pm and on weekends at [phone].

## 2022-02-04 NOTE — Plan of Care (Signed)

## 2022-02-05 DIAGNOSIS — S12600A Unspecified displaced fracture of seventh cervical vertebra, initial encounter for closed fracture: Secondary | ICD-10-CM | POA: Diagnosis not present

## 2022-02-05 DIAGNOSIS — S12691A Other nondisplaced fracture of seventh cervical vertebra, initial encounter for closed fracture: Secondary | ICD-10-CM | POA: Diagnosis not present

## 2022-02-05 LAB — GLUCOSE, CAPILLARY
Glucose-Capillary: 205 mg/dL — ABNORMAL HIGH (ref 70–99)
Glucose-Capillary: 227 mg/dL — ABNORMAL HIGH (ref 70–99)
Glucose-Capillary: 210 mg/dL — ABNORMAL HIGH (ref 70–99)

## 2022-02-05 MED ORDER — INSULIN ASPART 100 UNIT/ML IJ SOLN
3.0000 [IU] | Freq: Three times a day (TID) | INTRAMUSCULAR | Status: DC
  Administered 2022-02-05: 3 [IU] via SUBCUTANEOUS

## 2022-02-05 MED ORDER — LIDOCAINE 5 % EX PTCH
1.0000 | MEDICATED_PATCH | CUTANEOUS | Status: DC
  Administered 2022-02-05 – 2022-02-08 (×4): 1 via TRANSDERMAL
  Filled 2022-02-05 (×4): qty 1

## 2022-02-05 NOTE — Progress Notes (Signed)
HD#0 Subjective:  Overnight Events: none  Patient assessed at bedside this morning.  His daughter and son are present to help translate.  His daughter is concerned as he has had more pain in his left rib cage since getting up to sit at bedside yesterday.  Mr. Crooke states that when he lays still he does not have pain but he has electric shooting pain in back of rib cage with certain motions.  Otherwise his pain is well-controlled.  He had 2 bowel movements overnight.  Pt is updated on the plan for today, and all questions and concerns are addressed.   Objective:  Vital signs in last 24 hours: Vitals:   02/04/22 1549 02/04/22 2012 02/05/22 0518 02/05/22 0755  BP: 136/79 (!) 151/88 (!) 157/85 (!) 150/89  Pulse: (!) 103 97 98 (!) 106  Resp: '16 18 18 18  '$ Temp: 97.7 F (36.5 C) (!) 97.4 F (36.3 C) (!) 97.5 F (36.4 C) 98 F (36.7 C)  TempSrc: Oral Oral Oral Oral  SpO2: 98% 99% 98% 100%  Weight:      Height:       Supplemental O2: Room Air SpO2: 100 %   Physical Exam:  Constitutional: resting in bed with C-collar in place Cardiovascular: regular rate and rhythm, no m/r/g Pulmonary/Chest: normal work of breathing on room air, lungs clear to auscultation bilaterally Abdominal: soft, non-tender, non-distended MSK: tenderness present to posterior left rib cage,  Neurological: alert and oriented x3, moving limbs spontaneously Skin: warm and dry  Filed Weights   02/01/22 0859  Weight: 54.4 kg     Intake/Output Summary (Last 24 hours) at 02/05/2022 1001 Last data filed at 02/05/2022 0914 Gross per 24 hour  Intake 590 ml  Output 1300 ml  Net -710 ml   Net IO Since Admission: -2,048 mL [02/05/22 1001]  Pertinent Labs:    Latest Ref Rng & Units 02/04/2022    4:27 AM 02/03/2022    6:09 AM 02/01/2022    9:27 AM  CBC  WBC 4.0 - 10.5 K/uL 5.0  5.7    Hemoglobin 13.0 - 17.0 g/dL 9.8  10.0  9.9   Hematocrit 39.0 - 52.0 % 28.9  28.6  29.0   Platelets 150 - 400 K/uL 228   224         Latest Ref Rng & Units 02/04/2022    4:27 AM 02/03/2022    6:09 AM 02/01/2022    9:27 AM  CMP  Glucose 70 - 99 mg/dL 161  175  175   BUN 8 - 23 mg/dL '12  12  15   '$ Creatinine 0.61 - 1.24 mg/dL 0.80  1.03  0.90   Sodium 135 - 145 mmol/L 134  134  134   Potassium 3.5 - 5.1 mmol/L 3.9  4.1  4.1   Chloride 98 - 111 mmol/L 101  100  100   CO2 22 - 32 mmol/L 21  23    Calcium 8.9 - 10.3 mg/dL 8.6  8.7      Imaging: No results found.  Assessment/Plan:   Principal Problem:   C7 cervical fracture (HCC) Active Problems:   Prostate cancer metastatic to bone [facility])   Patient Summary: Michael [name] is a 88 y.o. with a pertinent PMH of metastatic prostate cancer, who presented with a fall and admitted on 12/13 for acute C7 fracture on HD#5.    Acute C7 fracture secondary to mechanical fall Pathologic fracture of left sixth rib Patient reports  that his pain is well-controlled.  He is having some pain in his his left posterior rib cage.  He denies other concerns at this time.  No surgery evaluated the patient on admission and he will need to remain in c-collar for 4 weeks and follow-up with them. -TOC for SNF placement -Control with 1000 mg Tylenol every 8 hours, oxy Cotton 10 mg every 12 hours with oxycodone 5 mg every 4 hours as needed -Taryn gel -lidocaine patch for rib pain -incentive spirometer -PT/OT -Continue bowel regimen  Metastatic prostate cancer Chronic Urinary retention He has metastatic disease to bladder, rectum, left hip, lumbar spine, thoracic spine, and left shoulder.  Current plan is to continue with palliative radiation and have patient go to skilled nursing facility to regain function.  Daughter is planning to eventually transition to hospice.  -Continue Enzalutamide and leuprorelin   -Chronic Foley catheter in place, use monthly changes with [facility] -Continue contact precautions for prior history of ESBL producing  urinary organisms, no concern for infection at this time  Iron Deficiency anemia Deficit in September was 931.  Patient has received IV iron x 2.  No further IV iron indicated at this time.  Hemoglobin stable at 9.8 12/16. -Continue p.o. iron 150 mg daily  Type 2 diabetes Sugars are well-controlled on sliding scale insulin. -Holding home medications of glipizide, metformin, and pioglitazone. -Continue SSI   Diet: Normal VTE: Enoxaparin Code: DNR/DNI PT/OT recs: SNF for Subacute PT. Family Update: updated at bedside   Dispo: Anticipated discharge to Skilled nursing facility in 1-2 days pending SNF placement.   Ellar Hakala M. Verbie Babic, D.O.  Internal Medicine Resident, PGY-2 [facility]  Pager: [phone] 10:01 AM, 02/05/2022   **Please contact the on call pager after 5 pm and on weekends at [phone].**

## 2022-02-05 NOTE — Plan of Care (Signed)

## 2022-02-06 ENCOUNTER — Ambulatory Visit: Payer: Medicare HMO | Admitting: Radiation Oncology

## 2022-02-06 DIAGNOSIS — M8448XA Pathological fracture, other site, initial encounter for fracture: Secondary | ICD-10-CM | POA: Diagnosis not present

## 2022-02-06 DIAGNOSIS — S12600A Unspecified displaced fracture of seventh cervical vertebra, initial encounter for closed fracture: Secondary | ICD-10-CM | POA: Diagnosis not present

## 2022-02-06 DIAGNOSIS — S12691A Other nondisplaced fracture of seventh cervical vertebra, initial encounter for closed fracture: Secondary | ICD-10-CM | POA: Diagnosis not present

## 2022-02-06 LAB — GLUCOSE, CAPILLARY
Glucose-Capillary: 258 mg/dL — ABNORMAL HIGH (ref 70–99)
Glucose-Capillary: 321 mg/dL — ABNORMAL HIGH (ref 70–99)
Glucose-Capillary: 240 mg/dL — ABNORMAL HIGH (ref 70–99)

## 2022-02-06 MED ORDER — INSULIN GLARGINE-YFGN 100 UNIT/ML ~~LOC~~ SOLN
5.0000 [IU] | Freq: Every day | SUBCUTANEOUS | Status: DC
  Administered 2022-02-06 – 2022-02-07 (×2): 5 [IU] via SUBCUTANEOUS
  Filled 2022-02-06 (×4): qty 0.05

## 2022-02-06 MED ORDER — INSULIN ASPART 100 UNIT/ML IJ SOLN
5.0000 [IU] | Freq: Three times a day (TID) | INTRAMUSCULAR | Status: DC
  Administered 2022-02-06 – 2022-02-08 (×6): 5 [IU] via SUBCUTANEOUS

## 2022-02-06 NOTE — TOC Progression Note (Signed)
Transition of Care [facility]) - Progression Note    Patient Details  Name: AMARIAN BOTERO MRN: [id number] Date of Birth: 03-Jan-1934  Transition of Care [facility]) CM/SW [place], RN Phone Number: 02/06/2022, 4:40 PM  Clinical Narrative:    Cm met with the patient and daughter at the bedside.  The patient with no bed offers in the [place], [place] , [place], [place] area at this time.  The patient's daughter speaks [place] and plans to have family available for communication at the hospital.  The daughter was given Medicare.gov resources for nursing home compare and patient faxed out further towards [place] area.  The patient's daughter states that if no bed offers are determined that she may likely take him home with home health.  I discussed this with the daughter in depth.  The patient's daughter asked about Palliative Outpatient follow up.  I will give her a list and choice in the am.  CM will continue to follow the patient for SNF versus home with home health.     Barriers to Discharge: Continued Medical Work up  Expected Discharge Plan and Services       Post Acute Care Choice: Durable Medical Equipment                   DME Arranged: Bedside commode, Hospital bed DME Agency: [employer] Date DME Agency Contacted: 02/02/22 Time DME Agency Contacted: 1228 Representative spoke with at DME Agency: Cyril Mourning             Social Determinants of Health ([place]) Interventions    Readmission Risk Interventions     No data to display

## 2022-02-06 NOTE — Progress Notes (Signed)
Physical Therapy Treatment Patient Details Name: Michael [name] MRN: [id number] DOB: 1933/05/28 Today's Date: 02/06/2022   History of Present Illness 88 yo male admitted 12/13 after fall down stairs with slick socks with C7 fx and Left 6th rib fx. PMhx:metastatic prostate CA, DM, HTN, CAD, COPD, HLD, GERD, Raynauds    PT Comments    Pt pleasant with daughter present, premedicated and reporting no pain on arrival. Pt willing to attempt mobility and achieved EOB but reports sharp increase in pain and unable to progress OOB. Pt and daughter report he got OOB and to bathroom earlier today and yesterday and that he had not been hindered by since [place] until today. RN aware. Will continue to follow and attempt progression.    Recommendations for follow up therapy are one component of a multi-disciplinary discharge planning process, led by the attending physician.  Recommendations may be updated based on patient status, additional functional criteria and insurance authorization.  Follow Up Recommendations  Skilled nursing-short term rehab (<3 hours/day) Can patient physically be transported by private vehicle: No   Assistance Recommended at Discharge Frequent or constant Supervision/Assistance  Patient can return home with the following Assistance with cooking/housework;Direct supervision/assist for medications management;Assist for transportation;A lot of help with walking and/or transfers;A lot of help with bathing/dressing/bathroom   Equipment Recommendations  BSC/3in1    Recommendations for Other Services       Precautions / Restrictions Precautions Precautions: Cervical;Fall Required Braces or Orthoses: Cervical Brace Cervical Brace: At all times;Hard collar     Mobility  Bed Mobility Overal bed mobility: Needs Assistance Bed Mobility: Rolling, Sidelying to Sit, Sit to Sidelying Rolling: Min assist Sidelying to sit: Min assist     Sit to sidelying: Min assist General bed  mobility comments: pt able to roll to right with cues for bending knees and sequence, min assist to rise from side. Once in sitting pt reporting increased pain and unable to tolerate sitting with pt returning self to supine with guarding for lines. Min assist to roll bil for pad change and max +2 to slide toward HOB. Pt denied reattempting mobility after initial sitting trial. Increased pain despite premedication    Transfers                   General transfer comment: unable due to pain    Ambulation/Gait                   Stairs             Wheelchair Mobility    Modified Rankin (Stroke Patients Only)       Balance                                            Cognition Arousal/Alertness: Awake/alert Behavior During Therapy: Flat affect Overall Cognitive Status: Difficult to assess                                 General Comments: Pt following commands; difficulty to assess due to language, but daughter reports cognition seems nearly normal        Exercises      General Comments        Pertinent Vitals/Pain Pain Assessment Pain Score: 8  Pain Location: left posterior rib, neck Pain Descriptors / Indicators: Guarding,  Grimacing Pain Intervention(s): Limited activity within patient's tolerance, Monitored during session, Premedicated before session, Repositioned    Home Living                          Prior Function            PT Goals (current goals can now be found in the care plan section) Progress towards PT goals: Not progressing toward goals - comment (limited by pain)    Frequency    Min 3X/week      PT Plan Current plan remains appropriate;Frequency needs to be updated    Co-evaluation              AM-PAC PT "6 Clicks" Mobility   Outcome Measure  Help needed turning from your back to your side while in a flat bed without using bedrails?: A Little Help needed moving from  lying on your back to sitting on the side of a flat bed without using bedrails?: A Little Help needed moving to and from a bed to a chair (including a wheelchair)?: A Little Help needed standing up from a chair using your arms (e.g., wheelchair or bedside chair)?: A Lot Help needed to walk in hospital room?: A Lot Help needed climbing 3-5 steps with a railing? : Total 6 Click Score: 14    End of Session Equipment Utilized During Treatment: Cervical collar Activity Tolerance: Patient limited by pain Patient left: in bed;with call bell/phone within reach;with family/visitor present;with nursing/sitter in room Nurse Communication: Mobility status;Precautions PT Visit Diagnosis: Other abnormalities of gait and mobility (R26.89);Muscle weakness (generalized) (M62.81);Unsteadiness on feet (R26.81)     Time: 7341-9379 PT Time Calculation (min) (ACUTE ONLY): 17 min  Charges:  $Therapeutic Activity: 8-22 mins                     Bayard Males, PT Acute Rehabilitation Services Office: [phone]    Sandy Salaam Merlean Pizzini 02/06/2022, 12:42 PM

## 2022-02-06 NOTE — Progress Notes (Addendum)
Subjective:  Overnight Events: none  Patient assessed at bedside this morning. Daughter is at bedside to help translate. He states the past two days have been excellent, in fact he felt good enough to go home. This morning, he was able to ambulate to the restroom by himself with minimal pain. However, when his gown was being changed he started to develop severe pain in his neck, similar to how it felt before. He denies any chest pain, or pain anywhere else.   Of note, yesterday he declined his tylenol because he was feeling better, and has not used his PRN oxycodone.   Objective:  Vital signs in last 24 hours: Vitals:   02/05/22 2207 02/06/22 0429 02/06/22 0740 02/06/22 1546  BP: 127/79 (!) 145/88 132/71 121/65  Pulse: 95 (!) 102 (!) 106 92  Resp: '17 18 14 15  '$ Temp: 97.6 F (36.4 C) 98.7 F (37.1 C) 97.6 F (36.4 C) 98.3 F (36.8 C)  TempSrc: Oral  Oral Oral  SpO2: 99% 100%    Weight:      Height:        Physical Exam:  Constitutional: Elder pleasant gentleman, resting in bed with C-collar in place Neck: C-Collar in place, pain with rotation of neck Cardiovascular: regular rate and rhythm, no m/r/g Pulmonary/Chest: normal work of breathing on room air, lungs clear to auscultation bilaterally Abdominal: soft, non-tender, non-distended MSK: tenderness present to posterior left rib cage,  Neurological: alert and oriented x3, 5/5 strength in bilateral upper and lower extremities, sensation in tact in all extremities Skin: warm and dry  Filed Weights   02/01/22 0859  Weight: 54.4 kg     Intake/Output Summary (Last 24 hours) at 02/06/2022 1552 Last data filed at 02/05/2022 1800 Gross per 24 hour  Intake --  Output 700 ml  Net -700 ml    Net IO Since Admission: -2,748 mL [02/06/22 1552]  Pertinent Labs:    Latest Ref Rng & Units 02/04/2022    4:27 AM 02/03/2022    6:09 AM 02/01/2022    9:27 AM  CBC  WBC 4.0 - 10.5 K/uL 5.0  5.7    Hemoglobin 13.0 - 17.0  g/dL 9.8  10.0  9.9   Hematocrit 39.0 - 52.0 % 28.9  28.6  29.0   Platelets 150 - 400 K/uL 228  224         Latest Ref Rng & Units 02/04/2022    4:27 AM 02/03/2022    6:09 AM 02/01/2022    9:27 AM  CMP  Glucose 70 - 99 mg/dL 161  175  175   BUN 8 - 23 mg/dL '12  12  15   '$ Creatinine 0.61 - 1.24 mg/dL 0.80  1.03  0.90   Sodium 135 - 145 mmol/L 134  134  134   Potassium 3.5 - 5.1 mmol/L 3.9  4.1  4.1   Chloride 98 - 111 mmol/L 101  100  100   CO2 22 - 32 mmol/L 21  23    Calcium 8.9 - 10.3 mg/dL 8.6  8.7      Imaging: No results found.  Assessment/Plan:   Principal Problem:   C7 cervical fracture (HCC) Active Problems:   Prostate cancer metastatic to bone [facility])   Patient Summary: PRATT BRESS is a 88 y.o. with a pertinent PMH of metastatic prostate cancer, who presented with a fall and admitted on 12/13 for acute C7 fracture on HD#5.    #Acute C7 fracture  secondary to mechanical fall #Pathologic fracture of left sixth rib Pt complaining of pain in his cervical spine that feels like how it was after the fall. The past two days he has been great with his pain, so he did not take his tylenol. He has also not been using his PRN oxycodone. Pain started when he was being repositioned and changing his gown, so   could have irritated cervical spine during movement as well. He denies any other pain, and rib cage pain is doing better. Currently pending SNF placement, and as of now has not been accepted.  Will need to remain in [facility] for 4 weeks and follow-up with Neurosurgery.   Plan: -TOC for SNF placement -Control with 1000 mg Tylenol every 8 hours, OxyContin 10 mg every 12 hours with oxycodone 5 mg every 4 hours as needed -Taryn gel -lidocaine patch for rib pain -incentive spirometer -PT/OT -Continue bowel regimen   #Chronic Indwelling Catheter secondary chronic urinary retention in the setting  metastatic prostate cancer  He has metastatic disease to bladder,  rectum, left hip, lumbar spine, thoracic spine, and left shoulder.  Current plan is to continue with palliative radiation and have patient go to skilled nursing facility to regain function.  Daughter is planning to eventually transition to hospice.  -Continue Enzalutamide and leuprorelin   -Chronic Foley catheter in place, use monthly changes with [facility] -Continue contact precautions for prior history of ESBL producing urinary organisms, no concern for infection at this time  #Iron Deficiency anemia Deficit in September was 931.  Patient has received IV iron x 2.  No further IV iron indicated at this time.  Hemoglobin stable at 9.8 12/16. -Continue p.o. iron 150 mg daily  #Type 2 diabetes Glucose level increased in the mid 200 range, and 321. He is currently on a sliding scale and 2U of insulin with meals.  Plan:  -Holding home medications of glipizide, metformin, and pioglitazone. - Increase mealtime insulin to 5U - Continue SSI - Will add 5U long acting semglee at bedtime   Diet: Normal VTE: Enoxaparin Code: DNR/DNI PT/OT recs: SNF for Subacute PT. Family Update: updated at bedside   Dispo: Anticipated discharge to Skilled nursing facility in 1-2 days pending SNF placement.    Drucie Opitz, M.D. Internal Medicine Resident, PGY-1 [facility]  Pager: [phone]  **Please contact the on call pager after 5 pm and on weekends at [phone].**

## 2022-02-07 ENCOUNTER — Encounter: Payer: Self-pay | Admitting: Hospice and Palliative Medicine

## 2022-02-07 DIAGNOSIS — S12600A Unspecified displaced fracture of seventh cervical vertebra, initial encounter for closed fracture: Secondary | ICD-10-CM | POA: Diagnosis not present

## 2022-02-07 LAB — GLUCOSE, CAPILLARY
Glucose-Capillary: 230 mg/dL — ABNORMAL HIGH (ref 70–99)
Glucose-Capillary: 160 mg/dL — ABNORMAL HIGH (ref 70–99)
Glucose-Capillary: 115 mg/dL — ABNORMAL HIGH (ref 70–99)
Glucose-Capillary: 256 mg/dL — ABNORMAL HIGH (ref 70–99)

## 2022-02-07 NOTE — Progress Notes (Signed)
Subjective:  Overnight Events: none  Patient assessed at bedside this morning. Daughter is at bedside to help translate. He states his pain is present, but not as bad as it was before. Explained to patient that he has PRN medications if the pain does start to get worse, and made sure he was aware that he did not need to be in pain. Also discussed patient's diet, and recent increase in glucose levels. Daughter states that he has been eating home food and friends have been bringing sweets to wish him well.  Objective:  Vital signs in last 24 hours: Vitals:   02/06/22 1546 02/06/22 1940 02/07/22 0337 02/07/22 0912  BP: 121/65 (!) 147/75 135/79 124/74  Pulse: 92 96 91 93  Resp: '15 18  16  '$ Temp: 98.3 F (36.8 C) 98 F (36.7 C) 97.7 F (36.5 C) (!) 97.5 F (36.4 C)  TempSrc: Oral Oral Oral Oral  SpO2:  99% 100% 99%  Weight:      Height:        Physical Exam:  Constitutional: Elder pleasant gentleman, resting  on couch with C-collar in place Neck: C-Collar in place, pain on posterior neck with movement Cardiovascular: regular rate and rhythm, no m/r/g Pulmonary/Chest: normal work of breathing on room air, lungs clear to auscultation bilaterally Abdominal: soft, non-tender, non-distended MSK: Mild tenderness present to posterior left rib cage  Neurological: alert and oriented x3, 5/5 strength in bilateral upper and lower extremities, sensation in tact in all extremities Skin: warm and dry  Filed Weights   02/01/22 0859  Weight: 54.4 kg     Intake/Output Summary (Last 24 hours) at 02/07/2022 1407 Last data filed at 02/07/2022 0900 Gross per 24 hour  Intake --  Output 1300 ml  Net -1300 ml    Net IO Since Admission: -4,048 mL [02/07/22 1407]  Pertinent Labs:    Latest Ref Rng & Units 02/04/2022    4:27 AM 02/03/2022    6:09 AM 02/01/2022    9:27 AM  CBC  WBC 4.0 - 10.5 K/uL 5.0  5.7    Hemoglobin 13.0 - 17.0 g/dL 9.8  10.0  9.9   Hematocrit 39.0 - 52.0 % 28.9   28.6  29.0   Platelets 150 - 400 K/uL 228  224         Latest Ref Rng & Units 02/04/2022    4:27 AM 02/03/2022    6:09 AM 02/01/2022    9:27 AM  CMP  Glucose 70 - 99 mg/dL 161  175  175   BUN 8 - 23 mg/dL '12  12  15   '$ Creatinine 0.61 - 1.24 mg/dL 0.80  1.03  0.90   Sodium 135 - 145 mmol/L 134  134  134   Potassium 3.5 - 5.1 mmol/L 3.9  4.1  4.1   Chloride 98 - 111 mmol/L 101  100  100   CO2 22 - 32 mmol/L 21  23    Calcium 8.9 - 10.3 mg/dL 8.6  8.7      Imaging: No results found.  Assessment/Plan:   Principal Problem:   C7 cervical fracture (HCC) Active Problems:   Prostate cancer metastatic to bone [facility])   Patient Summary: Michael [name] is a 88 y.o. with a pertinent PMH of metastatic prostate cancer, who presented with a fall and admitted on 12/13 for acute C7 fracture on HD#6.    #Acute C7 fracture secondary to mechanical fall #Pathologic fracture of left sixth rib  Pt's pain improving since yesterday, as he is finally using his pain meds. He is working with physical therapy daily. Daughter also helps him with walks from the bed to the bathroom, and pt was resting comfortably in his chair on encounter. Currently working on SNF placement, as patient's daughter lives in [place] and she would like him to be in a facility there.   Will need to remain in [facility] for 4 weeks and follow-up with Neurosurgery.   Plan: -TOC for SNF placement - Pain Control with 1000 mg Tylenol every 8 hours, OxyContin 10 mg every 12 hours with oxycodone 5 mg every 4 hours as needed -Taryn gel -lidocaine patch for rib pain -incentive spirometer -PT/OT -Continue bowel regimen   #Chronic Indwelling Catheter secondary chronic urinary retention in the setting  metastatic prostate cancer  He has metastatic disease to bladder, rectum, left hip, lumbar spine, thoracic spine, and left shoulder.  Current plan is to continue with palliative radiation and have patient go to skilled nursing  facility to regain function.  Daughter is planning to eventually transition to hospice.  -Continue Enzalutamide and leuprorelin   -Chronic Foley catheter in place, use monthly changes with [facility] -Continue contact precautions for prior history of ESBL producing urinary organisms, no concern for infection at this time  #Iron Deficiency anemia Deficit in September was 931.  Patient has received IV iron x 2.  No further IV iron indicated at this time.  Hemoglobin stable at 9.8 12/16. -Continue p.o. iron 150 mg daily  #Type 2 diabetes Rise in glucose level can be contributed to daughter bringing in home food and friends bringing in sweets for him. Due to patient's prognosis given metastatic prostate cancer, and daughter on the verge of intiating palliative care, we will set daily fasting goals to 200.   Plan:  - Holding home medications of glipizide, metformin, and pioglitazone. - Increase mealtime insulin to 5U - Continue SSI - 5U semglee at night   Diet: Normal VTE: Enoxaparin Code: DNR/DNI PT/OT recs: SNF for Subacute PT. Family Update: updated at bedside   Dispo: Anticipated discharge to Skilled nursing facility in 1-2 days pending SNF placement.    Drucie Opitz, M.D. Internal Medicine Resident, PGY-1 [facility]  Pager: [phone]  **Please contact the on call pager after 5 pm and on weekends at [phone].**

## 2022-02-07 NOTE — Progress Notes (Signed)
Occupational Therapy Treatment Patient Details Name: TYDUS SANMIGUEL MRN: [id number] DOB: Jul 19, 1933 Today's Date: 02/07/2022   History of present illness 88 yo male admitted 12/13 after fall down stairs with slick socks with C7 fx and Left 6th rib fx. PMhx:metastatic prostate CA, DM, HTN, CAD, COPD, HLD, GERD, Raynauds   OT comments  Pt in bed upon therapy arrival with daughter present in room to interpret. Pt agreeable to participate in therapy. Able to transition to sitting on EOB although with increased time due to pain level. Pt preferred to maintain a right lateral bend posture as daughter states that it feels better. Able to stand and walk to sink with RW. Required min physical assist for RW management although pt appeared to not rely on AD for balance/support. Daughter did confirm that pt was a furniture walker unless he had nothing to hold onto; then he used the RW. Once at sink, pt expressed severe pain and was unable to stand to complete grooming task. Returned to bed. Pain appeared to diminish once back in bed.    Recommendations for follow up therapy are one component of a multi-disciplinary discharge planning process, led by the attending physician.  Recommendations may be updated based on patient status, additional functional criteria and insurance authorization.    Follow Up Recommendations  Skilled nursing-short term rehab (<3 hours/day)     Assistance Recommended at Discharge Frequent or constant Supervision/Assistance  Patient can return home with the following  A little help with walking and/or transfers;A little help with bathing/dressing/bathroom;Assistance with cooking/housework;Direct supervision/assist for medications management;Direct supervision/assist for financial management;Assist for transportation;Help with stairs or ramp for entrance   Equipment Recommendations  None recommended by OT       Precautions / Restrictions Precautions Precautions:  Cervical;Fall Required Braces or Orthoses: Cervical Brace Cervical Brace: At all times;Hard collar Restrictions Weight Bearing Restrictions: No       Mobility Bed Mobility Overal bed mobility: Needs Assistance Bed Mobility: Supine to Sit, Sit to Supine     Supine to sit: Min guard, HOB elevated Sit to supine: Min guard           Balance Overall balance assessment: Needs assistance Sitting-balance support: Single extremity supported, Feet supported Sitting balance-Leahy Scale: Fair Sitting balance - Comments: seated with guarded posture while seated EOB (right side bend). No physical assist needed to maintain sitting balance.   Standing balance support: Bilateral upper extremity supported, During functional activity Standing balance-Leahy Scale: Fair Standing balance comment: Used RW although did not appear to require it to maintain balance.     ADL either performed or assessed with clinical judgement      Cognition Arousal/Alertness: Awake/alert Behavior During Therapy: WFL for tasks assessed/performed Overall Cognitive Status: Difficult to assess                     Pertinent Vitals/ Pain       Pain Assessment Pain Assessment: Faces Faces Pain Scale: Hurts worst Pain Location: left posterior rib/thoracic region when sitting on EOB and standing. Pain Descriptors / Indicators: Grimacing, Guarding, Discomfort, Moaning Pain Intervention(s): Limited activity within patient's tolerance, Monitored during session, Repositioned         Frequency  Min 2X/week        Progress Toward Goals  OT Goals(current goals can now be found in the care plan section)  Progress towards OT goals: Not progressing toward goals - comment (pain level during functional mobility and sitting EOB limiting pt's progress)  ADL  Goals Pt Will Perform Lower Body Bathing: with min assist;sit to/from stand;sitting/lateral leans Pt Will Perform Upper Body Dressing: with min  assist;sitting Pt Will Perform Lower Body Dressing: with min assist;sitting/lateral leans;sit to/from stand Additional ADL Goal #1: Pt will demonstrate increased activity tolerance and standing endurance while completing self care task standing for 5-7 minutes before requiring a seated rest break.  Plan Discharge plan remains appropriate;Frequency remains appropriate       AM-PAC OT "6 Clicks" Daily Activity     Outcome Measure   Help from another person eating meals?: None Help from another person taking care of personal grooming?: A Little Help from another person toileting, which includes using toliet, bedpan, or urinal?: A Lot Help from another person bathing (including washing, rinsing, drying)?: A Lot Help from another person to put on and taking off regular upper body clothing?: A Little Help from another person to put on and taking off regular lower body clothing?: A Lot 6 Click Score: 16    End of Session Equipment Utilized During Treatment: Gait belt;Rolling walker (2 wheels);Cervical collar  OT Visit Diagnosis: Unsteadiness on feet (R26.81);Muscle weakness (generalized) (M62.81);History of falling (Z91.81);Other abnormalities of gait and mobility (R26.89);Pain Pain - Right/Left: Left Pain - part of body:  (back/posterior ribs)   Activity Tolerance Patient limited by pain   Patient Left in bed;with call bell/phone within reach;with family/visitor present           Time: 1417-1440 OT Time Calculation (min): 23 min  Charges: OT General Charges $OT Visit: 1 Visit OT Treatments $Therapeutic Activity: 23-37 mins  Ailene Ravel, OTR/L,CBIS  Supplemental OT - [facility] and [facility]   Tamakia Porto, Clarene Duke 02/07/2022, 3:18 PM

## 2022-02-07 NOTE — Progress Notes (Signed)
CSW spoke with patient's daughter [name] to discuss discharge plan. Hina states she wants her father placed in [facility].   CSW spoke with Lavella Lemons at [facility] who states she will review patient.  CSW sent text to Tammy at [facility] requesting a return call.  Madilyn Fireman, MSW, LCSW Transitions of Care  Clinical Social Worker II [phone]

## 2022-02-08 ENCOUNTER — Other Ambulatory Visit: Payer: Self-pay | Admitting: Hospice and Palliative Medicine

## 2022-02-08 ENCOUNTER — Telehealth (HOSPITAL_COMMUNITY): Payer: Self-pay | Admitting: Pharmacy Technician

## 2022-02-08 ENCOUNTER — Observation Stay (HOSPITAL_COMMUNITY): Payer: Medicare HMO

## 2022-02-08 ENCOUNTER — Other Ambulatory Visit (HOSPITAL_COMMUNITY): Payer: Self-pay

## 2022-02-08 ENCOUNTER — Encounter: Payer: Self-pay | Admitting: Internal Medicine

## 2022-02-08 ENCOUNTER — Encounter: Payer: Self-pay | Admitting: Hospice and Palliative Medicine

## 2022-02-08 DIAGNOSIS — S12600A Unspecified displaced fracture of seventh cervical vertebra, initial encounter for closed fracture: Secondary | ICD-10-CM | POA: Diagnosis not present

## 2022-02-08 DIAGNOSIS — R531 Weakness: Secondary | ICD-10-CM | POA: Diagnosis not present

## 2022-02-08 DIAGNOSIS — C61 Malignant neoplasm of prostate: Secondary | ICD-10-CM | POA: Diagnosis not present

## 2022-02-08 DIAGNOSIS — Z7401 Bed confinement status: Secondary | ICD-10-CM | POA: Diagnosis not present

## 2022-02-08 LAB — GLUCOSE, CAPILLARY
Glucose-Capillary: 237 mg/dL — ABNORMAL HIGH (ref 70–99)
Glucose-Capillary: 171 mg/dL — ABNORMAL HIGH (ref 70–99)
Glucose-Capillary: 80 mg/dL (ref 70–99)

## 2022-02-08 MED ORDER — OXYCODONE HCL 5 MG PO TABS
5.0000 mg | ORAL_TABLET | ORAL | 0 refills | Status: DC | PRN
  Filled 2022-02-08: qty 30, 5d supply, fill #0

## 2022-02-08 MED ORDER — OXYCODONE HCL ER 10 MG PO T12A
10.0000 mg | EXTENDED_RELEASE_TABLET | Freq: Two times a day (BID) | ORAL | 0 refills | Status: DC
  Filled 2022-02-08: qty 14, 7d supply, fill #0

## 2022-02-08 MED ORDER — LIDOCAINE 5 % EX PTCH
1.0000 | MEDICATED_PATCH | CUTANEOUS | 0 refills | Status: DC
  Filled 2022-02-08: qty 30, 30d supply, fill #0

## 2022-02-08 MED ORDER — POLYETHYLENE GLYCOL 3350 17 GM/SCOOP PO POWD
17.0000 g | Freq: Two times a day (BID) | ORAL | 0 refills | Status: DC
  Filled 2022-02-08: qty 238, 7d supply, fill #0

## 2022-02-08 MED ORDER — NIFEDIPINE ER 30 MG PO TB24
30.0000 mg | ORAL_TABLET | Freq: Every day | ORAL | 0 refills | Status: DC
  Filled 2022-02-08: qty 90, 90d supply, fill #0

## 2022-02-08 MED ORDER — SENNOSIDES-DOCUSATE SODIUM 8.6-50 MG PO TABS
2.0000 | ORAL_TABLET | Freq: Two times a day (BID) | ORAL | 0 refills | Status: DC
  Filled 2022-02-08: qty 120, 30d supply, fill #0

## 2022-02-08 NOTE — Progress Notes (Signed)
Patient daughter educated on how to properly clean foley catheter. Daughter verbalized understanding.

## 2022-02-08 NOTE — Plan of Care (Signed)

## 2022-02-08 NOTE — Progress Notes (Signed)
    Durable Medical Equipment  (From admission, onward)           Start     Ordered   02/08/22 1554  For home use only DME lightweight manual wheelchair with seat cushion  Once       Comments: Patient suffers from cancer which impairs their ability to perform daily activities like bathing, dressing, feeding, grooming, and toileting in the home.  A cane, crutch, or walker will not resolve  issue with performing activities of daily living. A wheelchair will allow patient to safely perform daily activities. Patient is not able to propel themselves in the home using a standard weight wheelchair due to endurance and general weakness. Patient can self propel in the lightweight wheelchair. Length of need Lifetime. Accessories: elevating leg rests (ELRs), wheel locks, extensions and anti-tippers.   02/08/22 1553

## 2022-02-08 NOTE — Progress Notes (Addendum)
Patient was denied admission into [facility] due to the high cost of one of his medications.  CSW spoke with patient's daughter [name] to inform her of information. Hina states she wants home health services arranged prior to discharge. Hina requesting ambulance for transport due to patient's inability to mobilize. RN to call transport this afternoon.   CSW spoke with Tommi Rumps of [facility] who states the agency can accept the patient.  Madilyn Fireman, MSW, LCSW Transitions of Care  Clinical Social Worker II [phone]

## 2022-02-08 NOTE — Telephone Encounter (Signed)
Patient Advocate Encounter   Received notification that prior authorization for OxyCONTIN '10MG'$  er tablets is required.   PA submitted on 02/08/2022 Key [id number] Status is pending       Lyndel Safe, CPhT Pharmacy Patient Advocate Specialist [facility] Patient Advocate Team Direct Number: [phone]  Fax: [phone]

## 2022-02-08 NOTE — Progress Notes (Signed)
Physical Therapy Treatment Patient Details Name: Michael [name] MRN: [id number] DOB: 07-Feb-1934 Today's Date: 02/08/2022   History of Present Illness 88 yo male admitted 12/13 after fall down stairs with slick socks with C7 fx and Left 6th rib fx. PMhx:metastatic prostate CA, DM, HTN, CAD, COPD, HLD, GERD, Raynauds    PT Comments    Patient received in bed, he is agreeable to PT session. Patient having moderate pain with mobility but only required min A overall. He is able to tolerate ambulation to hallway and back to recliner. Pain limited.  Patient will continue to benefit from skilled PT to improve strength, mobility and independence.    Recommendations for follow up therapy are one component of a multi-disciplinary discharge planning process, led by the attending physician.  Recommendations may be updated based on patient status, additional functional criteria and insurance authorization.  Follow Up Recommendations  Home health PT Can patient physically be transported by private vehicle: Yes   Assistance Recommended at Discharge Intermittent Supervision/Assistance  Patient can return home with the following Assistance with cooking/housework;Direct supervision/assist for medications management;Assist for transportation;A lot of help with bathing/dressing/bathroom;A little help with walking and/or transfers   Equipment Recommendations  None recommended by PT (daughter reports they got 3 in 1, hospital bed and patient has walker)    Recommendations for Other Services       Precautions / Restrictions Precautions Precautions: Cervical;Fall Required Braces or Orthoses: Cervical Brace Cervical Brace: Hard collar;At all times Restrictions Weight Bearing Restrictions: No     Mobility  Bed Mobility Overal bed mobility: Needs Assistance Bed Mobility: Supine to Sit     Supine to sit: Min assist, HOB elevated     General bed mobility comments: patient received with HOB  raised and required min A to bring legs around off bed. Min assist to raise trunk to full sitting    Transfers Overall transfer level: Needs assistance Equipment used: Rolling walker (2 wheels) Transfers: Sit to/from Stand Sit to Stand: Min assist           General transfer comment: min A with cues for hand placement    Ambulation/Gait Ambulation/Gait assistance: Min guard Gait Distance (Feet): 20 Feet Assistive device: Rolling walker (2 wheels) Gait Pattern/deviations: Step-through pattern, Decreased step length - right, Decreased step length - left, Decreased stride length Gait velocity: decr     General Gait Details: patient ambulated just outside room door and back to recliner at window. Limited by pain. Requesting to go back to room.   Stairs             Wheelchair Mobility    Modified Rankin (Stroke Patients Only)       Balance Overall balance assessment: Needs assistance Sitting-balance support: Feet supported Sitting balance-Leahy Scale: Fair Sitting balance - Comments: tends to want to lean to his right side in sitting for pain relief Postural control: Right lateral lean Standing balance support: Bilateral upper extremity supported, During functional activity, Reliant on assistive device for balance Standing balance-Leahy Scale: Fair Standing balance comment: Used RW although did not appear to require it to maintain balance. Occasionally leaning to his right in standing for pain relief as well                            Cognition Arousal/Alertness: Awake/alert Behavior During Therapy: WFL for tasks assessed/performed Overall Cognitive Status: Difficult to assess  General Comments: Pt following commands; difficulty to assess due to language, but daughter reports cognition seems nearly normal        Exercises      General Comments        Pertinent Vitals/Pain Pain Assessment Pain  Assessment: Faces Faces Pain Scale: Hurts even more Pain Location: left arm/shoulder and thoracic area Pain Descriptors / Indicators: Aching, Discomfort, Grimacing, Guarding, Moaning Pain Intervention(s): Monitored during session, Repositioned    Home Living                          Prior Function            PT Goals (current goals can now be found in the care plan section) Acute Rehab PT Goals Patient Stated Goal: to now go home, daughter states they are unable to fine SNF for him and she will take him home. PT Goal Formulation: With family Time For Goal Achievement: 02/16/22 Potential to Achieve Goals: Good Progress towards PT goals: Progressing toward goals    Frequency    Min 3X/week      PT Plan Discharge plan needs to be updated    Co-evaluation              AM-PAC PT "6 Clicks" Mobility   Outcome Measure  Help needed turning from your back to your side while in a flat bed without using bedrails?: A Little Help needed moving from lying on your back to sitting on the side of a flat bed without using bedrails?: A Little Help needed moving to and from a bed to a chair (including a wheelchair)?: A Little Help needed standing up from a chair using your arms (e.g., wheelchair or bedside chair)?: A Little Help needed to walk in hospital room?: A Little Help needed climbing 3-5 steps with a railing? : A Little 6 Click Score: 18    End of Session Equipment Utilized During Treatment: Gait belt;Cervical collar Activity Tolerance: Patient limited by pain Patient left: in chair;with family/visitor present Nurse Communication: Mobility status PT Visit Diagnosis: Other abnormalities of gait and mobility (R26.89);Muscle weakness (generalized) (M62.81);Unsteadiness on feet (R26.81);Difficulty in walking, not elsewhere classified (R26.2);Pain Pain - Right/Left: Left Pain - part of body: Shoulder (mid thoracic spine)     Time: 9604-5409 PT Time Calculation  (min) (ACUTE ONLY): 22 min  Charges:  $Gait Training: 8-22 mins                     Arvie Bartholomew, PT, GCS 02/08/22,10:17 AM

## 2022-02-08 NOTE — Discharge Summary (Addendum)
Name: Michael [name] MRN: [id number] DOB: 15-Jan-1934 88 y.o. PCP: [name], MD  Date of Admission: 02/01/2022  8:38 AM Date of Discharge: 02/08/2022 Attending Physician: Angelica Pou, MD  Discharge Diagnosis: 1. Principal Problem:   C7 cervical fracture (HCC) Active Problems:   Prostate cancer metastatic to bone [facility])    Discharge Medications: Allergies as of 02/08/2022   No Known Allergies      Medication List     STOP taking these medications    Calcium 600+D3 600-20 MG-MCG Tabs Generic drug: Calcium Carb-Cholecalciferol   glucose blood test strip Commonly known as: Accu-Chek Guide   oxyCODONE 5 MG immediate release tablet Commonly known as: Oxy IR/ROXICODONE Replaced by: oxyCODONE 10 mg 12 hr tablet   pioglitazone 30 MG tablet Commonly known as: ACTOS       TAKE these medications    acetaminophen 500 MG tablet Commonly known as: TYLENOL Take 500 mg by mouth every 4 (four) hours as needed for moderate pain or fever.   atorvastatin 10 MG tablet Commonly known as: LIPITOR Take 10 mg by mouth daily at 6 PM.   Combivent Respimat 20-100 MCG/ACT Aers respimat Generic drug: Ipratropium-Albuterol Inhale 1 puff into the lungs 4 (four) times daily.   glimepiride 2 MG tablet Commonly known as: AMARYL Take 2 mg by mouth daily with breakfast.   ibuprofen 200 MG tablet Commonly known as: ADVIL Take 200 mg by mouth every 6 (six) hours as needed for fever or moderate pain.   iron polysaccharides 150 MG capsule Commonly known as: NIFEREX Take 150 mg by mouth daily.   lidocaine 5 % Commonly known as: LIDODERM Place 1 patch onto the skin daily. Remove & Discard patch within 12 hours or as directed by MD Start taking on: February 09, 2022   metFORMIN 1000 MG tablet Commonly known as: GLUCOPHAGE Take 1,000 mg by mouth 2 (two) times daily.   metoprolol succinate 25 MG 24 hr tablet Commonly known as: TOPROL-XL TAKE 1 TABLET BY MOUTH  EVERY DAY FOR BLOOD PRESSURE What changed: See the new instructions.   multivitamin with minerals tablet Take 1 tablet by mouth daily.   Myrbetriq 25 MG Tb24 tablet Generic drug: mirabegron ER TAKE 1 TABLET (25 MG TOTAL) BY MOUTH DAILY.   NIFEdipine 30 MG 24 hr tablet Commonly known as: ADALAT CC Take 30 mg by mouth in the morning and at bedtime.   omeprazole 20 MG capsule Commonly known as: PRILOSEC TAKE ONE CAPSULE BY MOUTH EVERY DAY   oxyCODONE 10 mg 12 hr tablet Commonly known as: OXYCONTIN Take 1 tablet (10 mg total) by mouth every 12 (twelve) hours. Replaces: oxyCODONE 5 MG immediate release tablet   oxyCODONE 5 MG immediate release tablet Commonly known as: Oxy IR/ROXICODONE Take 1 tablet (5 mg total) by mouth every 4 (four) hours as needed for breakthrough pain.   polyethylene glycol powder 17 GM/SCOOP powder Commonly known as: GLYCOLAX/MIRALAX Take 1 capful (17 g) with water by mouth 2 (two) times daily.   senna-docusate 8.6-50 MG tablet Commonly known as: Senokot-S Take 2 tablets by mouth 2 (two) times daily.   Xtandi 40 MG tablet Generic drug: enzalutamide Take 2 tablets (80 mg total) by mouth daily.               Durable Medical Equipment  (From admission, onward)           Start     Ordered   02/02/22 1220  For home use only  DME Hospital bed  Once       Question Answer Comment  Length of Need Lifetime   Patient has (list medical condition): Metastatic Ca   The above medical condition requires: Patient requires the ability to reposition frequently   Head must be elevated greater than: 45 degrees   Bed type Semi-electric      02/02/22 1220   02/02/22 1217  For home use only DME Bedside commode  Once       Comments: Patient can't ambulate more than 45 feet, can't ambulate to bathroom.  Question:  Patient needs a bedside commode to treat with the following condition  Answer:  Generalized weakness   02/02/22 1220             Disposition and follow-up:   Michael [name] was discharged from [facility] in Good condition.  At the hospital follow up visit please address:  1.  Please address patient's diabetes medication with metformin and glipizide. Please ensure he has accurate followup with urology and oncology for his indwelling catheter and prostate cancer. Please refill pain medications if necessary, and make sure pt has neurosurgery follow up.  Please ensure patient has all necessary equipment for living at home.   2.  Labs / imaging needed at time of follow-up: BMP  3.  Pending labs/ test needing follow-up: N/A  Follow-up Appointments:  Follow-up [place] Follow up.   Why: Follow up for Outpatient Palliative care services.                Hospital Course by problem list:  #Acute C7 fracture secondary to mechanical fall #Pathologic Fracture of Left Sixth Rib  Patient initially presented after a fall.  Initial imaging showed patient had acute C7 spine fracture.  Neurosurgery recommended management with a c-collar for the next 3 to 4 weeks and follow-up outpatient.  Pain regimen was optimized with OxyContin 10 mg twice daily, Tylenol 1000 mg scheduled, and oxycodone '5mg'$  for breakthrough pain.  Patient also was given Voltaren gel for pain as well. Pt was discharge home with daughter and home health PT/OT.    #Chronic Indwelling Catheter Secondary to Chronic Urinary Retnetion in the Setting of Metastatic Prostate Cancer. Patient diagnosed with prostate cancer in 2018.  He has metastatic disease to the bladder, rectum, left hip, lumbar spine, thoracic spine, and left shoulder.  There is also some pathologic fractures noted to left ribs.  Patient currently on leuprorelin and Enzalutamide.  After long discussions with daughter, daughter is thinking about going towards hospice, and revealing diagnosis and prognosis to patient.   Patient did see palliative care here, and patient's daughter would like patient to get stronger before going towards hospice.  Hospice information given to daughter. Patient initially aggreable to going to SNF, however due to wait times of SNF daughter decided to take him home. He has follow up with urology and oncology on 02/17/22.    #Iron deficiency anemia Patient's iron deficit from September is 931.  Patient's sleep study showing decreased iron saturations and decreased iron.  Patient does have elevated ferritin.  Will continue with iron supplements.  Patient received iron infusion during hospitalization.  Patient did not require any blood transfusion during hospitalization.  #Type 2 diabetes mellitus Patient was managed with SSI, meal coverage and long acting insulin while home glimepiride, metformin, and pioglitazone were held.  At discharge, resumed home glimepriride and metformin, and discontinued pioglitazone. Given  patient's prognosis, recommend fasting glucose goal of 300 and simplification of regimen to avoid hypoglycemia.   #Raynaud Disease Patient continued on nifedipine during hospitalization chronic, patient is taking Nifedipine '30mg'$  daily.   #Hyperlipidemia Patient continued home Lipitor 10 mg daily during hospitalization though utility of continuation should be considered.   #CAD Patient continued home metoprolol succinate 25 mg daily during hospitalization.   #BPH Patient continued mirabegron ER 25 during admission.    Discharge Subjective:  Pt was seen this AM while sitting in bed. He was ready to go home, and was very thankful of treatment. Daughter was also present bedside and we discussed decision to go home vs waiting for a SNF. Answered all of daughters questions, and explained to her what she needs including: Home health PT and OT, hospital bed (which pt already had), and bedside commode.   Discharge Exam:   BP (!) 140/79 (BP Location: Right Arm)   Pulse 89    Temp (!) 97.5 F (36.4 C) (Oral)   Resp 18   Ht '5\' 6"'$  (1.676 m)   Wt 54.4 kg   SpO2 99%   BMI 19.37 kg/m  Discharge exam:   Constitutional: Elder pleasant gentleman, resting  on couch with C-collar in place Neck: C-Collar in place, pain on posterior neck with movement Cardiovascular: regular rate and rhythm, no m/r/g Pulmonary/Chest: normal work of breathing on room air, lungs clear to auscultation bilaterally Abdominal: soft, non-tender, non-distended MSK: Mild tenderness present to posterior left rib cage  Neurological: alert and oriented x3, 5/5 strength in bilateral upper and lower extremities, sensation in tact in all extremities Skin: warm and dry   Pertinent Labs, Studies, and Procedures:      Latest Ref Rng & Units 02/04/2022    4:27 AM 02/03/2022    6:09 AM 02/01/2022    9:27 AM  BMP  Glucose 70 - 99 mg/dL 161  175  175   BUN 8 - 23 mg/dL '12  12  15   '$ Creatinine 0.61 - 1.24 mg/dL 0.80  1.03  0.90   Sodium 135 - 145 mmol/L 134  134  134   Potassium 3.5 - 5.1 mmol/L 3.9  4.1  4.1   Chloride 98 - 111 mmol/L 101  100  100   CO2 22 - 32 mmol/L 21  23    Calcium 8.9 - 10.3 mg/dL 8.6  8.7          Latest Ref Rng & Units 02/04/2022    4:27 AM 02/03/2022    6:09 AM 02/01/2022    9:27 AM  CBC  WBC 4.0 - 10.5 K/uL 5.0  5.7    Hemoglobin 13.0 - 17.0 g/dL 9.8  10.0  9.9   Hematocrit 39.0 - 52.0 % 28.9  28.6  29.0   Platelets 150 - 400 K/uL 228  224       Discharge Instructions:  It was a pleasure meeting you. We are sending you home with the same pain regimen: Oxycontin '10mg'$  twice a day, Oxycodone '5mg'$  for breakthrough pain, lidocaine patches, and the tylenol. We are also going to also continue using the miralax and the senna to help with bowel movements and making sure you do not get constipated.   Your urology appointment is on 12/29 at [place] oncology appointment is on 12/29 at 3PM   Please make an appointment with your primary care provider, it seems like  you have one in February, but something sooner for follow up would  be great.  You can resume all your previous medications except the pioglitazone. Please do not take it pioglitazone. Discharge Instructions     Call MD for:  persistant dizziness or light-headedness   Complete by: As directed    Call MD for:  persistant nausea and vomiting   Complete by: As directed    Call MD for:  severe uncontrolled pain   Complete by: As directed    Call MD for:  temperature >100.4   Complete by: As directed    Diet - low sodium heart healthy   Complete by: As directed    Increase activity slowly   Complete by: As directed        Signed: Drucie Opitz, MD 02/08/2022, 3:09 PM   Pager: [phone]

## 2022-02-08 NOTE — Discharge Instructions (Addendum)
It was a pleasure meeting you. We are sending you home with the same pain regimen: Oxycontin '10mg'$  twice a day, Oxycodone '5mg'$  for breakthrough pain, lidocaine patches, and the tylenol. We are also going to also continue using the miralax and the senna to help with bowel movements and making sure you do not get constipated.   Your urology appointment is on 12/29 at [place] [facility] appointment is on 12/29 at 3PM   Please make an appointment with your primary care provider, it seems like you have one in February, but something sooner for follow up would be great.  You can resume all your previous medications except the pioglitazone. Please do not take it pioglitazone.

## 2022-02-09 ENCOUNTER — Other Ambulatory Visit (INDEPENDENT_AMBULATORY_CARE_PROVIDER_SITE_OTHER): Payer: Medicare HMO | Admitting: Internal Medicine

## 2022-02-09 DIAGNOSIS — S12691A Other nondisplaced fracture of seventh cervical vertebra, initial encounter for closed fracture: Secondary | ICD-10-CM

## 2022-02-09 MED ORDER — XTAMPZA ER 9 MG PO C12A: 9.0000 mg | EXTENDED_RELEASE_CAPSULE | Freq: Two times a day (BID) | ORAL | 0 refills | Status: DC

## 2022-02-09 MED ORDER — LIDOCAINE 5 % EX PTCH: 1.0000 | MEDICATED_PATCH | CUTANEOUS | 0 refills | Status: DC

## 2022-02-09 NOTE — Progress Notes (Signed)
Patient's family called back and told me that Dr. Quin Hoop DEA is not approved. I will resend the precription of oxycodone ER. If my DEA number is not approved, will ask our attending to send it tomorrow.

## 2022-02-09 NOTE — Progress Notes (Signed)
Prior authorization denied for oxycontin. I called and spoke with Driscilla Grammes. Patient is having pain with oxyIR. I will sent xtampza ER 9 mg BID for 5 days until they can get in to see outpatient palliative provider. Ginger Organ ER 9 mg BID, 10 tabs sent to [facility]

## 2022-02-09 NOTE — Addendum Note (Signed)
Addended byGaylan Gerold on: 02/09/2022 08:37 PM   Modules accepted: Orders

## 2022-02-10 ENCOUNTER — Encounter: Payer: Self-pay | Admitting: Hospice and Palliative Medicine

## 2022-02-10 ENCOUNTER — Other Ambulatory Visit: Payer: Self-pay | Admitting: Medical Oncology

## 2022-02-10 NOTE — Telephone Encounter (Signed)
Left voicemail requesting a response to MyChart message requesting verification of which medication is needed.

## 2022-02-14 ENCOUNTER — Ambulatory Visit: Payer: Medicare HMO | Admitting: Radiation Oncology

## 2022-02-14 ENCOUNTER — Other Ambulatory Visit: Payer: Self-pay | Admitting: *Deleted

## 2022-02-14 DIAGNOSIS — S12691A Other nondisplaced fracture of seventh cervical vertebra, initial encounter for closed fracture: Secondary | ICD-10-CM

## 2022-02-14 MED ORDER — XTAMPZA ER 9 MG PO C12A: 9.0000 mg | EXTENDED_RELEASE_CAPSULE | Freq: Two times a day (BID) | ORAL | 0 refills | Status: DC

## 2022-02-15 ENCOUNTER — Telehealth: Payer: Self-pay

## 2022-02-15 DIAGNOSIS — E1159 Type 2 diabetes mellitus with other circulatory complications: Secondary | ICD-10-CM | POA: Diagnosis not present

## 2022-02-15 NOTE — Telephone Encounter (Signed)
Patient was provided neck brace for acute C7 fracture during recent admission.  Daughter sent a message request a new neck brace.  Review of patient records indicate that Dr. Kathyrn Sheriff, neurosurgery, was consulted for acute C7 fracture.  He request [place] or [place] J collar for 3 to 4 weeks and then follow-up in the outpatient clinic.   Order for [facility] J cervical collar submitted to [facility] via [place] website (Order [phone])  Patient's daughter notified via MyChart message that order has been placed.

## 2022-02-16 ENCOUNTER — Other Ambulatory Visit: Payer: Self-pay | Admitting: Internal Medicine

## 2022-02-16 ENCOUNTER — Other Ambulatory Visit (HOSPITAL_COMMUNITY): Payer: Self-pay

## 2022-02-16 DIAGNOSIS — C61 Malignant neoplasm of prostate: Secondary | ICD-10-CM

## 2022-02-16 MED FILL — Iron Sucrose Inj 20 MG/ML (Fe Equiv): INTRAVENOUS | Qty: 10 | Status: AC

## 2022-02-16 NOTE — Telephone Encounter (Signed)
[facility] unable to supple neck brace due to "out of insurance network", daughter aware.  Patient's daughter also states she has not received a wheelchair as previously requested.  Order for wheelchair submitted to [facility] via [place] (order [phone])

## 2022-02-17 ENCOUNTER — Ambulatory Visit: Payer: Medicare HMO

## 2022-02-17 ENCOUNTER — Encounter: Payer: Self-pay | Admitting: Internal Medicine

## 2022-02-17 ENCOUNTER — Inpatient Hospital Stay: Payer: Medicare HMO

## 2022-02-17 ENCOUNTER — Ambulatory Visit (INDEPENDENT_AMBULATORY_CARE_PROVIDER_SITE_OTHER): Payer: Medicare HMO | Admitting: Physician Assistant

## 2022-02-17 ENCOUNTER — Inpatient Hospital Stay (HOSPITAL_BASED_OUTPATIENT_CLINIC_OR_DEPARTMENT_OTHER): Payer: Medicare HMO | Admitting: Internal Medicine

## 2022-02-17 VITALS — BP 113/77 | HR 94 | Temp 96.0°F | Resp 18 | Wt 118.0 lb

## 2022-02-17 VITALS — BP 130/86 | HR 105

## 2022-02-17 DIAGNOSIS — I10 Essential (primary) hypertension: Secondary | ICD-10-CM | POA: Diagnosis not present

## 2022-02-17 DIAGNOSIS — Z978 Presence of other specified devices: Secondary | ICD-10-CM | POA: Diagnosis not present

## 2022-02-17 DIAGNOSIS — R2689 Other abnormalities of gait and mobility: Secondary | ICD-10-CM | POA: Diagnosis not present

## 2022-02-17 DIAGNOSIS — M79601 Pain in right arm: Secondary | ICD-10-CM | POA: Diagnosis not present

## 2022-02-17 DIAGNOSIS — E1136 Type 2 diabetes mellitus with diabetic cataract: Secondary | ICD-10-CM | POA: Diagnosis not present

## 2022-02-17 DIAGNOSIS — R339 Retention of urine, unspecified: Secondary | ICD-10-CM | POA: Diagnosis not present

## 2022-02-17 DIAGNOSIS — I7300 Raynaud's syndrome without gangrene: Secondary | ICD-10-CM | POA: Diagnosis not present

## 2022-02-17 DIAGNOSIS — D509 Iron deficiency anemia, unspecified: Secondary | ICD-10-CM | POA: Diagnosis not present

## 2022-02-17 DIAGNOSIS — R590 Localized enlarged lymph nodes: Secondary | ICD-10-CM | POA: Diagnosis not present

## 2022-02-17 DIAGNOSIS — C61 Malignant neoplasm of prostate: Secondary | ICD-10-CM

## 2022-02-17 DIAGNOSIS — C7951 Secondary malignant neoplasm of bone: Secondary | ICD-10-CM | POA: Diagnosis not present

## 2022-02-17 DIAGNOSIS — S12631D Unspecified traumatic nondisplaced spondylolisthesis of seventh cervical vertebra, subsequent encounter for fracture with routine healing: Secondary | ICD-10-CM | POA: Diagnosis not present

## 2022-02-17 DIAGNOSIS — D649 Anemia, unspecified: Secondary | ICD-10-CM

## 2022-02-17 DIAGNOSIS — E1165 Type 2 diabetes mellitus with hyperglycemia: Secondary | ICD-10-CM | POA: Diagnosis not present

## 2022-02-17 DIAGNOSIS — N139 Obstructive and reflux uropathy, unspecified: Secondary | ICD-10-CM | POA: Diagnosis not present

## 2022-02-17 LAB — COMPREHENSIVE METABOLIC PANEL
ALT: 14 U/L (ref 0–44)
AST: 22 U/L (ref 15–41)
Albumin: 3.5 g/dL (ref 3.5–5.0)
Alkaline Phosphatase: 78 U/L (ref 38–126)
Anion gap: 10 (ref 5–15)
BUN: 26 mg/dL — ABNORMAL HIGH (ref 8–23)
CO2: 23 mmol/L (ref 22–32)
Calcium: 8.9 mg/dL (ref 8.9–10.3)
Chloride: 100 mmol/L (ref 98–111)
Creatinine, Ser: 0.76 mg/dL (ref 0.61–1.24)
GFR, Estimated: >60 mL/min (ref >60)
Glucose, Bld: 137 mg/dL — ABNORMAL HIGH (ref 70–99)
Potassium: 4.2 mmol/L (ref 3.5–5.1)
Sodium: 133 mmol/L — ABNORMAL LOW (ref 135–145)
Total Bilirubin: 0.4 mg/dL (ref 0.3–1.2)
Total Protein: 7.5 g/dL (ref 6.5–8.1)

## 2022-02-17 LAB — CBC WITH DIFFERENTIAL/PLATELET
Abs Immature Granulocytes: 0.1 10*3/uL — ABNORMAL HIGH (ref 0.00–0.07)
Basophils Absolute: 0 10*3/uL (ref 0.0–0.1)
Basophils Relative: 0 %
Eosinophils Absolute: 0.1 10*3/uL (ref 0.0–0.5)
Eosinophils Relative: 1 %
HCT: 30.6 % — ABNORMAL LOW (ref 39.0–52.0)
Hemoglobin: 10.3 g/dL — ABNORMAL LOW (ref 13.0–17.0)
Immature Granulocytes: 1 %
Lymphocytes Relative: 7 %
Lymphs Abs: 0.5 10*3/uL — ABNORMAL LOW (ref 0.7–4.0)
MCH: 32.2 pg (ref 26.0–34.0)
MCHC: 33.7 g/dL (ref 30.0–36.0)
MCV: 95.6 fL (ref 80.0–100.0)
Monocytes Absolute: 0.8 10*3/uL (ref 0.1–1.0)
Monocytes Relative: 11 %
Neutro Abs: 5.6 10*3/uL (ref 1.7–7.7)
Neutrophils Relative %: 80 %
Platelets: 268 10*3/uL (ref 150–400)
RBC: 3.2 MIL/uL — ABNORMAL LOW (ref 4.22–5.81)
RDW: 16.8 % — ABNORMAL HIGH (ref 11.5–15.5)
WBC: 7.1 10*3/uL (ref 4.0–10.5)
nRBC: 0 % (ref 0.0–0.2)

## 2022-02-17 MED ORDER — OXYCODONE HCL 5 MG PO TABS: ORAL_TABLET | ORAL | 0 refills | Status: DC

## 2022-02-17 NOTE — Progress Notes (Signed)
[place] OFFICE PROGRESS NOTE  Patient Care Team: [name], MD as PCP - General (Internal Medicine) Wellington [name], MD as PCP - Cardiology (Cardiology) [name], MD as Consulting Physician (Oncology)   Cancer Staging  No matching staging information was found for the patient.   Oncology History Overview Note  # FEB 2019-Metastatic prostate cancer/ castrate sensitive [bulky retroperitoneal adenopathy; invasion into the bladder; rectum] Bladder Bx- prostate. On Degarelix  # April 3rd 2019- [name] [1258md];  Lupron q 6 M [[id number]]; MID AUG 2019- reduced dose to 8758mday  #February 2023 bone scan PET scan progression of disease -bone metastases/ metastses-on RT to left hip/ lumbar area.-Complicated by multiple enteritis.  #May 2023 -right arm pain-secondary to tumor compression-MRI spine C7-T10 -status post evaluation with Dr. [name] Hyp[facility] 30 Gray [facility]   # Poorly controlled DM-on insulin  # s/p right PCN [explanted]/ Foley cath [Dr.Brandon]; multiple UTIs. [s/p ID; Dr.Ravishankar]  ----------------------------------------------------------------- DIAGNOSIS: [ FEB 2019] MET. PROSTATE CA  STAGE:   IV ;GOALS: PALLIATIVE  CURRENT/MOST RECENT THERAPY [ Feb-April2019] Lupron+ [name]    [facility] Chem[facility]   Patient is on Treatment Plan : Prostate cancer- Docetaxel q weekly     09/30/2021 -  Chemotherapy   Patient is on Treatment Plan : prostate cancer Docetaxel (25) q7d         INTERVAL HISTORY: Ambulating in a wheel chair.  Accompanied by his daughter.  GoMELQUAN ERNSBERGER89.o.  male pleasant patient above history of castrate resistant metastatic prostate cancer currently on Xtandi/ELigard is here for follow-up.  In the interim patient was hospitalized in the last month with acute C7  spine fracture after a fall; pathologic rib fractures noted as well.  Daughter reports he has been having pain and decreased mobility since discharge on 12/20 requiring more assistance with ambulation and feeding.   Patient continues to complain of generalized weakness.  Poor appetite.  Complains of pain in his buttocks.  Patient currently has a Foley catheter in place.  Patient continues to complain of pain.  He is taking long-acting OxyContin/Extampza [id number] a day; started yesterday.  And also taking oxycodone up to 3 pills a day.   Review of Systems  Constitutional:  Positive for malaise/fatigue. Negative for chills, diaphoresis, fever and weight loss.  HENT:  Negative for nosebleeds and sore throat.   Eyes:  Negative for double vision.  Respiratory:  Negative for cough, hemoptysis, sputum production and wheezing.   Cardiovascular:  Negative for chest pain, palpitations, orthopnea and leg swelling.  Gastrointestinal:  Negative for abdominal pain, blood in stool, constipation, diarrhea, heartburn, melena, nausea and vomiting.  Musculoskeletal:  Positive for back pain and joint pain.  Skin: Negative.  Negative for itching and rash.  Neurological:  Negative for dizziness, tingling, focal weakness, weakness and headaches.  Endo/Heme/Allergies:  Does not bruise/bleed easily.  Psychiatric/Behavioral:  Negative for depression. The patient is not nervous/anxious and does not have insomnia.     PAST MEDICAL HISTORY :  Past Medical History:  Diagnosis Date   Anemia    iron deficiency   Aortic atherosclerosis (HCC)    Bilateral carotid artery disease (HCC)    Mild plaque formation without obstructive disease noted on carotid Doppler.   Di[place] complication [place]  Essential hypertension    GERD (gastroesophageal reflux disease)  Hyperlipidemia    Hypertension    Left carotid artery stenosis    Nonobstructive Coronary artery disease    a. Previous cardiac  catheterization at [facility] in 2010. The patient was told about 2 blockages which did not require revascularization; b. 01/2015 Cath: LM nl, LAD 10p/m, 37md, D2 30ost, LCX nl, RCA min irregs, EF 55-65%; b. 12/2018 MV: No ischemia/infarct. CT images w/ mod 3 vessel Cor Ca2+ and mild-mod Ao atherosclerosis.   [place]   Raynaud disease    a. Managed w/ nifedipine.    PAST SURGICAL HISTORY :   Past Surgical History:  Procedure Laterality Date   CARDIAC CATHETERIZATION  2010   CARDIAC CATHETERIZATION N/A 02/03/2015   Procedure: Left Heart Cath and Coronary Angiography;  Surgeon: MWellington [name] MD;  [facility];  Service: Cardiovascular;  Laterality: N/A;   CATARACT EXTRACTION Bilateral    CYSTOSCOPY W/ RETROGRADES Bilateral 04/11/2017   Procedure: CYSTOSCOPY WITH RETROGRADE PYELOGRAM;  Surgeon: BHollice Espy MD;  Location: [facility];  Service: Urology;  Laterality: Bilateral;   CYSTOSCOPY W/ RETROGRADES Bilateral 09/12/2017   Procedure: CYSTOSCOPY WITH RETROGRADE PYELOGRAM;  Surgeon: BHollice Espy MD;  Location: [facility];  Service: Urology;  Laterality: Bilateral;   CYSTOSCOPY W/ RETROGRADES Bilateral 02/25/2018   Procedure: CYSTOSCOPY WITH RETROGRADE PYELOGRAM;  Surgeon: BHollice Espy MD;  Location: [facility];  Service: Urology;  Laterality: Bilateral;   CYSTOSCOPY W/ URETERAL STENT PLACEMENT Bilateral 09/12/2017   Procedure: CYSTOSCOPY WITH STENT REPLACEMENT;  Surgeon: BHollice Espy MD;  Location: [facility];  Service: Urology;  Laterality: Bilateral;   CYSTOSCOPY W/ URETERAL STENT REMOVAL Bilateral 02/25/2018   Procedure: CYSTOSCOPY WITH STENT REMOVAL;  Surgeon: BHollice Espy MD;  Location: [facility];  Service: Urology;  Laterality: Bilateral;   CYSTOSCOPY WITH STENT PLACEMENT Left 04/11/2017   Procedure: CYSTOSCOPY WITH STENT PLACEMENT and fulgeration;  Surgeon: BHollice Espy MD;  Location: [facility];  Service: Urology;  Laterality: Left;    CYSTOSCOPY WITH URETHRAL DILATATION Bilateral 04/11/2017   Procedure: CYSTOSCOPY WITH URETHRAL DILATATION;  Surgeon: BHollice Espy MD;  Location: [facility];  Service: Urology;  Laterality: Bilateral;   IR CONVERT RIGHT NEPHROSTOMY TO NEPHROURETERAL CATH  05/21/2017   IR NEPHROSTOMY PLACEMENT RIGHT  04/13/2017    FAMILY HISTORY :   Family History  Problem Relation Age of Onset   Hypertension Father     SOCIAL HISTORY:   Social History   Tobacco Use   Smoking status: Never   Smokeless tobacco: Never  Vaping Use   Vaping Use: Never used  Substance Use Topics   Alcohol use: No   Drug use: No    ALLERGIES:  has No Known Allergies.  MEDICATIONS:  Current Outpatient Medications  Medication Sig Dispense Refill   acetaminophen (TYLENOL) 500 MG tablet Take 500 mg by mouth every 4 (four) hours as needed for moderate pain or fever.     atorvastatin (LIPITOR) 10 MG tablet Take 10 mg by mouth daily at 6 PM.      enzalutamide (XTANDI) 40 MG tablet Take 2 tablets (80 mg total) by mouth daily. 60 tablet 3   glimepiride (AMARYL) 2 MG tablet Take 2 mg by mouth daily with breakfast.     ibuprofen (ADVIL,MOTRIN) 200 MG tablet Take 200 mg by mouth every 6 (six) hours as needed for fever or moderate pain.      iron polysaccharides (NIFEREX) 150 MG capsule Take 150 mg by mouth daily.      lidocaine (LIDODERM)  5 % Place 1 patch onto the skin daily. Remove & Discard patch within 12 hours or as directed by MD 10 patch 0   metFORMIN (GLUCOPHAGE) 1000 MG tablet Take 1,000 mg by mouth 2 (two) times daily.     metoprolol succinate (TOPROL-XL) 25 MG 24 hr tablet TAKE 1 TABLET BY MOUTH EVERY DAY FOR BLOOD PRESSURE (Patient taking differently: Take 25 mg by mouth daily.) 90 tablet 3   Multiple Vitamins-Minerals (MULTIVITAMIN WITH MINERALS) tablet Take 1 tablet by mouth daily.      MYRBETRIQ 25 MG TB24 tablet TAKE 1 TABLET (25 MG TOTAL) BY MOUTH DAILY. 90 tablet 3   NIFEdipine (ADALAT CC) 30 MG 24 hr tablet Take  30 mg by mouth in the morning and at bedtime.     omeprazole (PRILOSEC) 20 MG capsule TAKE ONE CAPSULE BY MOUTH EVERY DAY (Patient taking differently: Take 20 mg by mouth daily.) 90 capsule 2   polyethylene glycol powder (GLYCOLAX/MIRALAX) 17 GM/SCOOP powder Take 1 capful (17 g) with water by mouth 2 (two) times daily. (Patient taking differently: Take 17 g by mouth daily as needed for moderate constipation.) 238 g 0   senna-docusate (SENOKOT-S) 8.6-50 MG tablet Take 2 tablets by mouth 2 (two) times daily. (Patient taking differently: Take 2 tablets by mouth at bedtime as needed for moderate constipation.) 120 tablet 0   oxyCODONE (OXY IR/ROXICODONE) 5 MG immediate release tablet 1-2 pills every 4-6 hours as needed for pain. 90 tablet 0   No current facility-administered medications for this visit.    PHYSICAL EXAMINATION: ECOG PERFORMANCE STATUS: 1 - Symptomatic but completely ambulatory  BP 113/77 (BP Location: Left Arm, Patient Position: Sitting)   Pulse 94   Temp (!) 96 F (35.6 C)   Resp 18   Wt 118 lb (53.5 kg)   BMI 19.05 kg/m   Filed Weights   02/17/22 1456  Weight: 118 lb (53.5 kg)    Patient appears frail.    Physical Exam HENT:     Head: Normocephalic and atraumatic.     Mouth/Throat:     Pharynx: No oropharyngeal exudate.  Eyes:     Pupils: Pupils are equal, round, and reactive to light.  Cardiovascular:     Rate and Rhythm: Normal rate and regular rhythm.  Pulmonary:     Effort: Pulmonary effort is normal. No respiratory distress.     Breath sounds: Normal breath sounds. No wheezing.  Abdominal:     General: Bowel sounds are normal. There is no distension.     Palpations: Abdomen is soft. There is no mass.     Tenderness: There is no abdominal tenderness. There is no guarding or rebound.  Musculoskeletal:        General: No tenderness. Normal range of motion.     Cervical back: Normal range of motion and neck supple.  Skin:    General: Skin is warm.   Neurological:     Mental Status: He is alert and oriented to person, place, and time.  Psychiatric:        Mood and Affect: Affect normal.      LABORATORY DATA:  I have reviewed the data as listed    Component Value Date/Time   NA 133 (L) 02/17/2022 1442   NA 136 05/07/2012 1328   K 4.2 02/17/2022 1442   K 4.5 05/07/2012 1328   CL 100 02/17/2022 1442   CL 104 05/07/2012 1328   CO2 23 02/17/2022 1442   CO2 28 05/07/2012  1328   GLUCOSE 137 (H) 02/17/2022 1442   GLUCOSE 73 05/07/2012 1328   BUN 26 (H) 02/17/2022 1442   BUN 16 05/07/2012 1328   CREATININE 0.76 02/17/2022 1442   CREATININE 0.84 05/07/2012 1328   CALCIUM 8.9 02/17/2022 1442   CALCIUM 8.8 05/07/2012 1328   PROT 7.5 02/17/2022 1442   ALBUMIN 3.5 02/17/2022 1442   AST 22 02/17/2022 1442   ALT 14 02/17/2022 1442   ALKPHOS 78 02/17/2022 1442   BILITOT 0.4 02/17/2022 1442   GFRNONAA >60 02/17/2022 1442   GFRNONAA >60 05/07/2012 1328   GFRAA >60 11/17/2019 0819   GFRAA >60 05/07/2012 1328    No results found for: "SPEP", "UPEP"  Lab Results  Component Value Date   WBC 7.1 02/17/2022   NEUTROABS 5.6 02/17/2022   HGB 10.3 (L) 02/17/2022   HCT 30.6 (L) 02/17/2022   MCV 95.6 02/17/2022   PLT 268 02/17/2022      Chemistry      Component Value Date/Time   NA 133 (L) 02/17/2022 1442   NA 136 05/07/2012 1328   K 4.2 02/17/2022 1442   K 4.5 05/07/2012 1328   CL 100 02/17/2022 1442   CL 104 05/07/2012 1328   CO2 23 02/17/2022 1442   CO2 28 05/07/2012 1328   BUN 26 (H) 02/17/2022 1442   BUN 16 05/07/2012 1328   CREATININE 0.76 02/17/2022 1442   CREATININE 0.84 05/07/2012 1328      Component Value Date/Time   CALCIUM 8.9 02/17/2022 1442   CALCIUM 8.8 05/07/2012 1328   ALKPHOS 78 02/17/2022 1442   AST 22 02/17/2022 1442   ALT 14 02/17/2022 1442   BILITOT 0.4 02/17/2022 1442       RADIOGRAPHIC STUDIES: I have personally reviewed the radiological images as listed and agreed with the findings in  the report. No results found.   ASSESSMENT & PLAN:  Primary prostate cancer with metastasis from prostate to other site [facility]) # Metastatic prostate cancer/[FEB 2019] castrate RESISTANT; PET scan FEB 2023-  Large lesion in the inferior LEFT iliac bone and L5 vertebral body.  Other areas include proximal femur -T2 vertebral body.  Multiple other bony areas-involved however no activity.  Positive metastatic lymph node in the LEFT pelvis; but no distant nodal metastasis or visceral metastasis; MAY 2023-MRI cervical spine/T-spine epidural tumor causing infiltration  C7-T4  of causing right arm pain-MRI spine. s/p cervical spine radiation.   # Unfortunately patient's malignancy seem to be progressing- AP-CT scan OCT  2023 no obvious visible disease or progression of disease noted-except nodular lesion 1.5 cm noted ?  Anterior to the bladder-increasing in size to 1.9 cm- Clinically likely metastatic prostate cancer.   And also rising PSA; and worsening pain in the right shoulder area.  Again reviewed with the patient that he is a poor candidate for chemotherapy and immunotherapy is given his declining/performance status/multiple ESBL infection/sepsis/poorly controlled diabetes etc. discussed regarding hospice.  Daughter is interested in having a discussion with hospice.  However as of note patient continues to want to proceed with radiation for palliative reasons.  # Bone pain: Secondary to metastases from underlying malignancy.  Defer to Dr. Donella Stade regarding continuation of radiation/patient preference.  For now I would continue Extempza 9 mg twice a day; oxycodone 5 mg 1 to 2 pills every 4-6 hours as needed.  Long discussion with the patient's daughter regarding managing side effects of narcotics including but not limited to constipation drowsiness etc.  # Urinary obstruction-currently status  post Foley catheter exchange.  Defer to urology.  # OCT 2023-ESBL E. Coli/sepsis-status post meropenem.  Elevated  lactic acid on admission- ?  Secondary to infection/malignancy today lactic acid is normal at 1.9.  Stable.  No signs of active infection.  # Anemia-iron deficiency-hemoglobin ~10-11 hold Venofer.  # DM-type II- on metformin-STABLE [Dr.Solum]. -need to monitor closely on chemotherapy/steroids-246.  Recommend monitoring blood sugars closely following with PCP.  #IV access: PIV  Given the incurable nature of the disease /poor tolerance of therapy and in general less than 6 months of life expectancy-I introduced hospice philosophy to the patient and family.  Discussed that goal of care should be directed to symptom management rather than treating the underlying disease; and in the process help improve quality of life rather than quantity.  Discussed with hospice team would include-nurse, nurse aide, social worker and chaplain for help take care of patient with physical/emotional needs.  As discussed above patient's daughter is interested in getting an appointment with hospice.  But wants to hold off enrolling into hospice unless patient's performance status/symptoms continue to decline.  * eliagard- next AUG mid 2023.   # DISPOSITION:  # No venofer- # hospice referral MO:[id number] cancer # follow up TBD- Dr.B             Orders Placed This Encounter  Procedures   Ambulatory referral to Hospice    Referral Priority:   Routine    Referral Type:   Consultation    Referral Reason:   Specialty Services Required    Requested Specialty:   Hospice Services    Number of Visits Requested:   1    All questions were answered. The patient knows to call the clinic with any problems, questions or concerns.      [name], MD 02/17/2022 4:33 PM

## 2022-02-17 NOTE — Progress Notes (Signed)
Patient here for follow up. Reports that he has pain to back, arms and ribs due to recent fracture from fall.

## 2022-02-17 NOTE — Assessment & Plan Note (Addendum)
#   Metastatic prostate cancer/[FEB 2019] castrate RESISTANT; PET scan FEB 2023-  Large lesion in the inferior LEFT iliac bone and L5 vertebral body.  Other areas include proximal femur -T2 vertebral body.  Multiple other bony areas-involved however no activity.  Positive metastatic lymph node in the LEFT pelvis; but no distant nodal metastasis or visceral metastasis; MAY 2023-MRI cervical spine/T-spine epidural tumor causing infiltration  C7-T4  of causing right arm pain-MRI spine. s/p cervical spine radiation.   # Unfortunately patient's malignancy seem to be progressing- AP-CT scan OCT  2023 no obvious visible disease or progression of disease noted-except nodular lesion 1.5 cm noted ?  Anterior to the bladder-increasing in size to 1.9 cm- Clinically likely metastatic prostate cancer.   And also rising PSA; and worsening pain in the right shoulder area.  Again reviewed with the patient that he is a poor candidate for chemotherapy and immunotherapy is given his declining/performance status/multiple ESBL infection/sepsis/poorly controlled diabetes etc. discussed regarding hospice.  Daughter is interested in having a discussion with hospice.  However as of note patient continues to want to proceed with radiation for palliative reasons.  # Bone pain: Secondary to metastases from underlying malignancy.  Defer to Dr. Donella Stade regarding continuation of radiation/patient preference.  For now I would continue Extempza 9 mg twice a day; oxycodone 5 mg 1 to 2 pills every 4-6 hours as needed.  Long discussion with the patient's daughter regarding managing side effects of narcotics including but not limited to constipation drowsiness etc.  # Urinary obstruction-currently status post Foley catheter exchange.  Defer to urology.  # OCT 2023-ESBL E. Coli/sepsis-status post meropenem.  Elevated lactic acid on admission- ?  Secondary to infection/malignancy today lactic acid is normal at 1.9.  Stable.  No signs of active  infection.  # Anemia-iron deficiency-hemoglobin ~10-11 hold Venofer.  # DM-type II- on metformin-STABLE [Dr.Solum]. -need to monitor closely on chemotherapy/steroids-246.  Recommend monitoring blood sugars closely following with PCP.  #IV access: PIV  Given the incurable nature of the disease /poor tolerance of therapy and in general less than 6 months of life expectancy-I introduced hospice philosophy to the patient and family.  Discussed that goal of care should be directed to symptom management rather than treating the underlying disease; and in the process help improve quality of life rather than quantity.  Discussed with hospice team would include-nurse, nurse aide, social worker and chaplain for help take care of patient with physical/emotional needs.  As discussed above patient's daughter is interested in getting an appointment with hospice.  But wants to hold off enrolling into hospice unless patient's performance status/symptoms continue to decline.  * eliagard- next AUG mid 2023.   # DISPOSITION:  # No venofer- # hospice referral ZW:[facility] cancer # follow up TBD- Dr.B

## 2022-02-17 NOTE — Progress Notes (Signed)
Cath Change/ Replacement  Patient is present today for a catheter change due to urinary retention.  8m of water was removed from the balloon, a 16FR coude foley cath was removed without difficulty.  Patient was cleaned and prepped in a sterile fashion with betadine and 2% lidocaine jelly was instilled into the urethra. A 16 FR coude foley cath was replaced into the bladder, no complications were noted. Urine return was noted 376mand urine was yellow in color. The balloon was filled with 1090mf sterile water. A night bag was attached for drainage.  Patient tolerated well.    Performed by: SamDebroah LoopA-C  Additional notes: He is accompanied in clinic today by his daughter, Hina. He was hospitalized in the last month with acute C7 spine fracture after a fall; pathologic rib fractures noted as well. She reports he has been having pain and decreased mobility since discharge on 12/20 requiring more assistance with ambulation and feeding.  She expressed concerns today that if his mobility continues to decline, getting him to clinic for monthly catheter changes will pose a challenge. They were discharged with home PT and OT but not with home health nursing and while they had been considering home hospice, this has not yet been arranged.  We discussed that as his mobility declines and he requires increased assistance with ADLs, they may benefit from home health versus home hospice assistance; in my experience, both of these services could manage his monthly catheter exchanges. I encouraged her to raise these concerns with Drs. Hande and Brahmanday to see if either would be recommended/could be arranged. I will provide orders as needed to facilitate in-home catheter management.  Follow up: Return in about 4 weeks (around 03/17/2022) for Catheter exchange.

## 2022-02-21 ENCOUNTER — Other Ambulatory Visit (HOSPITAL_COMMUNITY): Payer: Self-pay

## 2022-02-21 ENCOUNTER — Telehealth: Payer: Self-pay | Admitting: *Deleted

## 2022-02-21 ENCOUNTER — Other Ambulatory Visit: Payer: Self-pay | Admitting: Internal Medicine

## 2022-02-21 ENCOUNTER — Other Ambulatory Visit: Payer: Self-pay

## 2022-02-21 DIAGNOSIS — S12691A Other nondisplaced fracture of seventh cervical vertebra, initial encounter for closed fracture: Secondary | ICD-10-CM

## 2022-02-21 DIAGNOSIS — C61 Malignant neoplasm of prostate: Secondary | ICD-10-CM

## 2022-02-21 MED ORDER — XTAMPZA ER 9 MG PO C12A: 9.0000 mg | EXTENDED_RELEASE_CAPSULE | Freq: Two times a day (BID) | ORAL | 0 refills | Status: DC

## 2022-02-21 NOTE — Telephone Encounter (Signed)
Last filled by Gaylan Gerold, DO.

## 2022-02-21 NOTE — Telephone Encounter (Signed)
Dr B has agreed to be Hospice attending and hospice is aware

## 2022-02-21 NOTE — Telephone Encounter (Signed)
Call from hospice stating patient wants Dr B to be his Hospice physician. Please advise if agree to be his hospice doctor

## 2022-02-22 ENCOUNTER — Telehealth: Payer: Self-pay | Admitting: *Deleted

## 2022-02-22 ENCOUNTER — Telehealth: Payer: Self-pay | Admitting: Internal Medicine

## 2022-02-22 NOTE — Addendum Note (Signed)
Addended by: Darl Pikes on: 02/22/2022 09:13 AM   Modules accepted: Orders

## 2022-02-22 NOTE — Telephone Encounter (Signed)
Spoke to the patient's daughter-currently enrolled in hospice.  Patient seems to be comfortable with better pain control under hospice care.  Discontinue- [name].  GB

## 2022-02-22 NOTE — Telephone Encounter (Signed)
Discontinued Xtandi from med list and patient is inactive with the pharmacy.

## 2022-02-22 NOTE — Telephone Encounter (Addendum)
Received fax for FMLA for patient [name], it will be intermittent leave to start 02/28/22. Form completed and sent for physician signature

## 2022-02-23 ENCOUNTER — Ambulatory Visit: Payer: Medicare HMO | Admitting: Radiation Oncology

## 2022-02-23 DIAGNOSIS — Z466 Encounter for fitting and adjustment of urinary device: Secondary | ICD-10-CM | POA: Diagnosis not present

## 2022-02-23 DIAGNOSIS — E119 Type 2 diabetes mellitus without complications: Secondary | ICD-10-CM | POA: Diagnosis not present

## 2022-02-23 DIAGNOSIS — C61 Malignant neoplasm of prostate: Secondary | ICD-10-CM | POA: Diagnosis not present

## 2022-02-23 DIAGNOSIS — D630 Anemia in neoplastic disease: Secondary | ICD-10-CM | POA: Diagnosis not present

## 2022-02-23 DIAGNOSIS — S12600D Unspecified displaced fracture of seventh cervical vertebra, subsequent encounter for fracture with routine healing: Secondary | ICD-10-CM | POA: Diagnosis not present

## 2022-02-23 DIAGNOSIS — N401 Enlarged prostate with lower urinary tract symptoms: Secondary | ICD-10-CM | POA: Diagnosis not present

## 2022-02-23 DIAGNOSIS — C7951 Secondary malignant neoplasm of bone: Secondary | ICD-10-CM | POA: Diagnosis not present

## 2022-02-24 ENCOUNTER — Encounter: Payer: Self-pay | Admitting: Internal Medicine

## 2022-02-24 NOTE — Telephone Encounter (Signed)
FMLA signed and faxed to [employer] 

## 2022-03-01 ENCOUNTER — Telehealth: Payer: Self-pay

## 2022-03-01 NOTE — Telephone Encounter (Signed)
This patient was seen in the ER on 02/01/22 for a fall and a C7 fracture. He was placed in collar and told to follow up in 3-4 weeks. Please get him scheduled to see Marzetta Board or Andee Poles the week of January 15th with xrays prior.

## 2022-03-01 NOTE — Telephone Encounter (Signed)
Daughter confirmed appt for 03/09/2022.

## 2022-03-02 ENCOUNTER — Encounter: Payer: Self-pay | Admitting: Internal Medicine

## 2022-03-09 ENCOUNTER — Ambulatory Visit: Payer: Medicare HMO | Admitting: Neurosurgery

## 2022-03-13 ENCOUNTER — Ambulatory Visit
Admission: RE | Admit: 2022-03-13 | Discharge: 2022-03-13 | Disposition: A | Discharge status: Home / Self Care | Payer: Medicare HMO | Source: Ambulatory Visit | Attending: Radiation Oncology | Admitting: Radiation Oncology

## 2022-03-13 VITALS — BP 125/80 | HR 117 | Resp 14 | Ht 66.0 in

## 2022-03-13 DIAGNOSIS — C61 Malignant neoplasm of prostate: Secondary | ICD-10-CM | POA: Diagnosis not present

## 2022-03-13 DIAGNOSIS — C7952 Secondary malignant neoplasm of bone marrow: Secondary | ICD-10-CM | POA: Diagnosis not present

## 2022-03-13 DIAGNOSIS — C7951 Secondary malignant neoplasm of bone: Secondary | ICD-10-CM

## 2022-03-13 DIAGNOSIS — Z191 Hormone sensitive malignancy status: Secondary | ICD-10-CM | POA: Diagnosis not present

## 2022-03-13 NOTE — Progress Notes (Signed)
Radiation Oncology Follow up Note  Name: Michael [name]   Date:   03/13/2022 MRN:  [id number] DOB: 01-13-1934    This 89 y.o. male presents to the clinic today for evaluation of palliative radiation therapy to his cervical spine and patient has known stage IV prostate cancer treated multiple times for palliation of bone pain.  REFERRING PROVIDER: [name], MD  HPI: Patient is a 89 year old male well-known to apartment having received multiple courses of palliative radiation therapy to his bone mets and stage IV prostate cancer.  He has been having some significant pain in his.  Neck region and recent CT scan shows acute C7 left articular and transverse part process fracture at C7-T1.  He is having no focal neurologic deficits.  He is currently under hospice care.  COMPLICATIONS OF TREATMENT: none  FOLLOW UP COMPLIANCE: keeps appointments   PHYSICAL EXAM:  BP 125/80   Pulse (!) 117   Resp 14   Ht '5\' 6"'$  (1.676 m)   BMI 19.05 kg/m  [place] elderly male with cervical collar in NAD.  Well-developed well-nourished patient in NAD. HEENT reveals PERLA, EOMI, discs not visualized.  Oral cavity is clear. No oral mucosal lesions are identified. Neck is clear without evidence of cervical or supraclavicular adenopathy. Lungs are clear to A&P. Cardiac examination is essentially unremarkable with regular rate and rhythm without murmur rub or thrill. Abdomen is benign with no organomegaly or masses noted. Motor sensory and DTR levels are equal and symmetric in the upper and lower extremities. Cranial nerves II through XII are grossly intact. Proprioception is intact. No peripheral adenopathy or edema is identified. No motor or sensory levels are noted. Crude visual fields are within normal range.  RADIOLOGY RESULTS: CT scans reviewed compatible with above-stated findings  PLAN: At this time based on the fracture of his C7 left articular and transverse process do not think palliative  radiation therapy will be successful in alleviating his pain.  Certainly would seek neurosurgical help should he be in a better shape and of less advanced age.  I discussed with the daughter that I do not really think the benefits of radiation outweigh the risks of radiation esophagitis and other side effects from radiation.  At this time he will continue with hospice care.  I be happy to reevaluate him at any time should further palliative treatment be indicated.  I would like to take this opportunity to thank you for allowing me to participate in the care of your patient.Noreene Filbert, MD

## 2022-03-20 ENCOUNTER — Telehealth: Payer: Self-pay | Admitting: *Deleted

## 2022-03-20 ENCOUNTER — Encounter: Payer: Medicare HMO | Admitting: Physician Assistant

## 2022-03-20 NOTE — Telephone Encounter (Signed)
Rcvd Prior auth request for patient's xtampza from [facility]. Patient is under [facility]. Call placed to Triage at [facility] care. Faxed prescription and prior auth request to Hospice.

## 2022-03-31 ENCOUNTER — Other Ambulatory Visit: Payer: Self-pay | Admitting: *Deleted

## 2022-03-31 MED ORDER — MORPHINE SULFATE (CONCENTRATE) 20 MG/ML PO SOLN: 10.0000 mg | Freq: Four times a day (QID) | ORAL | 0 refills | Status: DC | PRN

## 2022-04-05 ENCOUNTER — Telehealth: Payer: Self-pay | Admitting: Internal Medicine

## 2022-04-05 NOTE — Telephone Encounter (Signed)
Called the daughter to check on the patient.  Patient's pain controlled on morphine.  Overall decline noted as per the daughter.  Continue hospice.

## 2022-04-06 ENCOUNTER — Telehealth: Payer: Self-pay | Admitting: *Deleted

## 2022-04-06 ENCOUNTER — Other Ambulatory Visit: Payer: Medicare HMO

## 2022-04-06 ENCOUNTER — Ambulatory Visit: Payer: Medicare HMO | Admitting: Internal Medicine

## 2022-04-06 MED ORDER — MORPHINE SULFATE (CONCENTRATE) 20 MG/ML PO SOLN: 10.0000 mg | ORAL | 0 refills | Status: DC | PRN

## 2022-04-06 NOTE — Telephone Encounter (Signed)
Melinda form hospice called reporting that patient current MS dose is not controlling his pain and is asking for a change is dosing frequency or increase in dose. He is currently taking it every 6 hours. Please advise

## 2022-04-06 NOTE — Telephone Encounter (Signed)
I spoke with hospice nurse.  Patient having generalized abdominal pain without nausea or vomiting or abdominal distention.  Having normal bowel movements.  Okay to liberalize morphine to 10 mg every 4 hours as needed.  If consistent dosing is required, would consider starting him on MS Contin.  RN to follow-up regarding pain management with family.

## 2022-04-11 ENCOUNTER — Other Ambulatory Visit (HOSPITAL_COMMUNITY): Payer: Self-pay

## 2022-04-11 ENCOUNTER — Other Ambulatory Visit: Payer: Self-pay

## 2022-05-04 ENCOUNTER — Other Ambulatory Visit: Payer: Self-pay | Admitting: Hospice and Palliative Medicine

## 2022-05-04 MED ORDER — DEXAMETHASONE 2 MG PO TABS: 2.0000 mg | ORAL_TABLET | Freq: Every day | ORAL | 1 refills | Status: DC

## 2022-05-04 MED ORDER — MORPHINE SULFATE (CONCENTRATE) 20 MG/ML PO SOLN: 10.0000 mg | ORAL | 0 refills | Status: DC | PRN

## 2022-05-04 NOTE — Progress Notes (Signed)
I received a call from hospice nurse, Otila Kluver.  Patient having worse generalized pain.  He is taking morphine intermittently.  RN requested refill of morphine.  Also start him on steroid for possible osseous metastatic pain.  Monitor CBGs.

## 2022-05-09 ENCOUNTER — Ambulatory Visit: Payer: Medicare HMO | Admitting: Urology

## 2022-05-15 ENCOUNTER — Other Ambulatory Visit: Payer: Self-pay | Admitting: Hospice and Palliative Medicine

## 2022-05-15 ENCOUNTER — Other Ambulatory Visit (HOSPITAL_COMMUNITY): Payer: Self-pay

## 2022-05-15 MED ORDER — ONDANSETRON HCL 8 MG PO TABS: 8.0000 mg | ORAL_TABLET | Freq: Three times a day (TID) | ORAL | 0 refills | Status: DC | PRN

## 2022-05-15 MED ORDER — ONDANSETRON HCL 8 MG PO TABS
8.0000 mg | ORAL_TABLET | Freq: Three times a day (TID) | ORAL | 0 refills | Status: DC | PRN
  Filled 2022-05-15: qty 45, 15d supply, fill #0

## 2022-05-15 MED ORDER — MORPHINE SULFATE (CONCENTRATE) 20 MG/ML PO SOLN: 10.0000 mg | ORAL | 0 refills | Status: DC | PRN

## 2022-05-15 MED ORDER — MORPHINE SULFATE (CONCENTRATE) 20 MG/ML PO SOLN
10.0000 mg | ORAL | 0 refills | Status: DC | PRN
  Filled 2022-05-15: qty 60, 15d supply, fill #0

## 2022-05-15 NOTE — Addendum Note (Signed)
Addended by: Altha Harm R on: 05/15/2022 12:12 PM   Modules accepted: Orders

## 2022-05-15 NOTE — Progress Notes (Signed)
Hospice nurse, Mitzie, requested refill of morphine elixir in addition to prescription for ondansetron.  Sent to pharmacy.

## 2022-05-19 ENCOUNTER — Ambulatory Visit (INDEPENDENT_AMBULATORY_CARE_PROVIDER_SITE_OTHER): Payer: Medicare HMO | Admitting: Nurse Practitioner

## 2022-05-19 ENCOUNTER — Encounter (INDEPENDENT_AMBULATORY_CARE_PROVIDER_SITE_OTHER): Payer: Medicare HMO

## 2022-05-23 ENCOUNTER — Other Ambulatory Visit: Payer: Self-pay

## 2022-06-08 ENCOUNTER — Telehealth: Payer: Self-pay | Admitting: Internal Medicine

## 2022-06-08 NOTE — Telephone Encounter (Signed)
I called patient's daughter and offered my condolences.  Family very thankful for the call. Appreciate the cancer center staff for the care and support provided to the patient.  Death certificate signed.  FYI

## 2022-06-19 ENCOUNTER — Ambulatory Visit (INDEPENDENT_AMBULATORY_CARE_PROVIDER_SITE_OTHER): Payer: Medicare HMO | Admitting: Nurse Practitioner

## 2022-06-19 ENCOUNTER — Encounter (INDEPENDENT_AMBULATORY_CARE_PROVIDER_SITE_OTHER): Payer: Self-pay

## 2022-06-19 ENCOUNTER — Encounter (INDEPENDENT_AMBULATORY_CARE_PROVIDER_SITE_OTHER): Payer: Medicare HMO

## 2022-06-21 DEATH — deceased

## 2023-03-13 ENCOUNTER — Other Ambulatory Visit (HOSPITAL_COMMUNITY): Payer: Self-pay
